# Patient Record
Sex: Female | Born: 1965
Health system: Southern US, Community
[De-identification: ages and names within clinical notes are randomized; demographics above are authoritative.]

## PROBLEM LIST (undated history)

## (undated) DIAGNOSIS — G4730 Sleep apnea, unspecified: Secondary | ICD-10-CM

## (undated) DIAGNOSIS — M5416 Radiculopathy, lumbar region: Secondary | ICD-10-CM

## (undated) DIAGNOSIS — F329 Major depressive disorder, single episode, unspecified: Secondary | ICD-10-CM

## (undated) DIAGNOSIS — Z1211 Encounter for screening for malignant neoplasm of colon: Secondary | ICD-10-CM

## (undated) DIAGNOSIS — D649 Anemia, unspecified: Secondary | ICD-10-CM

## (undated) DIAGNOSIS — G8929 Other chronic pain: Secondary | ICD-10-CM

## (undated) DIAGNOSIS — R51 Headache: Secondary | ICD-10-CM

## (undated) DIAGNOSIS — M1990 Unspecified osteoarthritis, unspecified site: Secondary | ICD-10-CM

## (undated) DIAGNOSIS — N83209 Unspecified ovarian cyst, unspecified side: Secondary | ICD-10-CM

## (undated) DIAGNOSIS — H409 Unspecified glaucoma: Secondary | ICD-10-CM

## (undated) DIAGNOSIS — F419 Anxiety disorder, unspecified: Secondary | ICD-10-CM

## (undated) DIAGNOSIS — J189 Pneumonia, unspecified organism: Secondary | ICD-10-CM

## (undated) DIAGNOSIS — M549 Dorsalgia, unspecified: Secondary | ICD-10-CM

## (undated) DIAGNOSIS — F32A Depression, unspecified: Secondary | ICD-10-CM

## (undated) DIAGNOSIS — I82409 Acute embolism and thrombosis of unspecified deep veins of unspecified lower extremity: Secondary | ICD-10-CM

## (undated) DIAGNOSIS — T7840XA Allergy, unspecified, initial encounter: Secondary | ICD-10-CM

## (undated) HISTORY — DX: Anemia, unspecified: D64.9

## (undated) HISTORY — PX: SPINE SURGERY: SHX786

## (undated) HISTORY — PX: REDUCTION MAMMAPLASTY: SUR839

## (undated) HISTORY — DX: Unspecified ovarian cyst, unspecified side: N83.209

## (undated) HISTORY — DX: Radiculopathy, lumbar region: M54.16

## (undated) HISTORY — DX: Encounter for screening for malignant neoplasm of colon: Z12.11

## (undated) HISTORY — PX: TONSILLECTOMY: SUR1361

## (undated) HISTORY — DX: Major depressive disorder, single episode, unspecified: F32.9

---
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
## 1997-09-28 DIAGNOSIS — T7840XA Allergy, unspecified, initial encounter: Secondary | ICD-10-CM

## 1997-09-28 HISTORY — DX: Allergy, unspecified, initial encounter: T78.40XA

## 2003-09-29 HISTORY — PX: GASTRIC BYPASS: SHX52

## 2004-04-26 ENCOUNTER — Emergency Department (HOSPITAL_COMMUNITY): Admission: EM | Admit: 2004-04-26 | Discharge: 2004-04-26 | Payer: Self-pay | Admitting: Emergency Medicine

## 2004-09-22 ENCOUNTER — Emergency Department: Payer: Self-pay | Admitting: Emergency Medicine

## 2005-01-11 ENCOUNTER — Emergency Department: Payer: Self-pay | Admitting: Emergency Medicine

## 2005-04-14 ENCOUNTER — Ambulatory Visit: Payer: Self-pay | Admitting: Urology

## 2005-06-26 ENCOUNTER — Emergency Department: Payer: Self-pay | Admitting: Emergency Medicine

## 2005-06-26 ENCOUNTER — Other Ambulatory Visit: Payer: Self-pay

## 2006-09-30 ENCOUNTER — Ambulatory Visit: Payer: Self-pay | Admitting: Internal Medicine

## 2006-10-29 HISTORY — PX: ABDOMINAL HYSTERECTOMY: SHX81

## 2006-11-02 ENCOUNTER — Ambulatory Visit: Payer: Self-pay | Admitting: Unknown Physician Specialty

## 2006-11-02 ENCOUNTER — Other Ambulatory Visit: Payer: Self-pay

## 2006-11-04 ENCOUNTER — Ambulatory Visit: Payer: Self-pay | Admitting: Unknown Physician Specialty

## 2006-12-23 ENCOUNTER — Ambulatory Visit: Payer: Self-pay | Admitting: Internal Medicine

## 2007-09-29 HISTORY — PX: BACK SURGERY: SHX140

## 2007-10-24 ENCOUNTER — Emergency Department: Payer: Self-pay | Admitting: Emergency Medicine

## 2007-10-26 ENCOUNTER — Emergency Department: Payer: Self-pay | Admitting: Emergency Medicine

## 2007-12-05 ENCOUNTER — Ambulatory Visit (HOSPITAL_COMMUNITY): Admission: RE | Admit: 2007-12-05 | Discharge: 2007-12-06 | Payer: Self-pay | Admitting: Neurosurgery

## 2008-07-15 ENCOUNTER — Emergency Department: Payer: Self-pay | Admitting: Emergency Medicine

## 2009-04-11 ENCOUNTER — Ambulatory Visit: Payer: Self-pay | Admitting: Internal Medicine

## 2010-04-11 ENCOUNTER — Emergency Department: Payer: Self-pay | Admitting: Emergency Medicine

## 2011-01-18 ENCOUNTER — Emergency Department: Payer: Self-pay | Admitting: Emergency Medicine

## 2011-02-10 NOTE — Op Note (Signed)
[name], [name]             ACCOUNT NO.:  [id number]   MEDICAL RECORD NO.:  [id number]          PATIENT TYPE:  AMB   LOCATION:  SDS                          FACILITY:  [facility]   PHYSICIAN:  Henry A. Pool, M.D.    DATE OF BIRTH:  Apr 27, 1966   DATE OF PROCEDURE:  12/05/2007  DATE OF DISCHARGE:                               OPERATIVE REPORT   SERVICE:  Neurosurgery.   PREOPERATIVE DIAGNOSIS:  Left L5-S1 herniated nucleus pulposus with  radiculopathy.   POSTOPERATIVE DIAGNOSIS:  Left L5-S1 herniated nucleus pulposus with  radiculopathy.   PROCEDURE NOTE:  Left L5-S1 laminotomy and microdiskectomy.   SURGEON:  Kathaleen Maser. Pool, M.D.   ASSISTANT:  Reinaldo Meeker, M.D.   ANESTHESIA:  General endotracheal.   INDICATIONS:  [name] is a 45 year old female with a history of severe  back and left lower extremity pain.  MRI scanning demonstrates evidence  of a huge left-sided L5-S1 disk herniation with marked compression of  the thecal sac and left-sided S1 nerve root.  We discussed the options  of management including operative and nonoperative care.  The patient  has decided to proceed with a left-sided L5-S1 laminotomy and  microdiskectomy__________.   OPERATIVE NOTE:  The patient was placed on the operative table in the  supine position.  After adequate level of anesthesia was achieved, the  patient was positioned prone onto a Wilson frame with her pressure  points appropriately padded.  The patient's lumbar region was prepped  and draped sterilely.  A 10-blade was used to make a linear skin  incision overlying the L5-S1 interspace.  This was carried down sharply  in the midline.  Subperiosteal dissection was performed exposing the  lamina of facet joints at L5 and S1.  Deep __________ were placed.  Intraoperative x-rays were taken and the L5-S1 level was confirmed.  Laminotomy was then performed using high-speed drill, Kerrison rongeurs  to smooth the inferior aspect of the  lamina of L5 and medial aspect of  the L5-S1 facet joint, and the superior rim of the S1 lamina.  Ligament  flavum was then elevated and resected in the usual fashion using  Kerrison rongeurs __________ thecal sac and exiting S1 nerve root were  identified.  Microscope was then brought into the field and used  throughout for microdissection of the left-sided S1 nerve root and  underlying disk area.  Epidural venous plexus was coagulated and cut.  The thecal sac and nerve roots were gently mobilized.  Free disk  herniation was encountered.  This was dissected free using blunt nerve  hooks and pituitary rongeurs.  All ____________ the disk herniation were  resected.  Disk space was entered.  Disk was then removed of all loose  or obviously degenerative disk material.  At this point, a very thorough  decompression had been achieved.  There was no injury to the thecal or  the nerve roots.   The wound was then irrigated with antibiotic solution.  Hemostasis was  ensured with the bipolar electrocautery.  The wound was then closed in  layers using Vicryl suture.  Steri-Strips  and a sterile dressing were  applied.  There were no complications.  The patient tolerated the  procedure well and she returned to the recovery room postoperatively.           ______________________________  Kathaleen Maser Pool, M.D.     HAP/MEDQ  D:  12/05/2007  T:  12/06/2007  Job:  [id number]

## 2011-06-22 LAB — CBC
HCT: 38
MCHC: 33.1
MCV: 84.4
Platelets: 263
WBC: 7.8

## 2011-07-30 ENCOUNTER — Emergency Department: Payer: Self-pay | Admitting: *Deleted

## 2011-11-26 ENCOUNTER — Ambulatory Visit: Payer: Self-pay | Admitting: Specialist

## 2011-12-03 ENCOUNTER — Other Ambulatory Visit: Payer: Self-pay | Admitting: Neurosurgery

## 2011-12-03 DIAGNOSIS — M79605 Pain in left leg: Secondary | ICD-10-CM

## 2011-12-03 DIAGNOSIS — M79604 Pain in right leg: Secondary | ICD-10-CM

## 2011-12-03 DIAGNOSIS — M545 Low back pain: Secondary | ICD-10-CM

## 2011-12-04 ENCOUNTER — Ambulatory Visit
Admission: RE | Admit: 2011-12-04 | Discharge: 2011-12-04 | Disposition: A | Discharge status: Home / Self Care | Payer: Medicaid Other | Source: Ambulatory Visit | Attending: Neurosurgery | Admitting: Neurosurgery

## 2011-12-04 DIAGNOSIS — M79605 Pain in left leg: Secondary | ICD-10-CM

## 2011-12-04 DIAGNOSIS — M545 Low back pain: Secondary | ICD-10-CM

## 2011-12-04 DIAGNOSIS — M79604 Pain in right leg: Secondary | ICD-10-CM

## 2011-12-06 ENCOUNTER — Other Ambulatory Visit: Payer: Self-pay

## 2012-04-04 DIAGNOSIS — F32A Depression, unspecified: Secondary | ICD-10-CM

## 2012-04-04 DIAGNOSIS — F329 Major depressive disorder, single episode, unspecified: Secondary | ICD-10-CM | POA: Insufficient documentation

## 2012-04-04 DIAGNOSIS — G43909 Migraine, unspecified, not intractable, without status migrainosus: Secondary | ICD-10-CM | POA: Insufficient documentation

## 2012-04-04 HISTORY — DX: Depression, unspecified: F32.A

## 2012-09-28 HISTORY — PX: BACK SURGERY: SHX140

## 2012-10-19 ENCOUNTER — Other Ambulatory Visit: Payer: Self-pay | Admitting: Neurosurgery

## 2012-10-28 ENCOUNTER — Encounter (HOSPITAL_COMMUNITY): Payer: Self-pay | Admitting: Pharmacy Technician

## 2012-11-03 ENCOUNTER — Encounter (HOSPITAL_COMMUNITY)
Admission: RE | Admit: 2012-11-03 | Discharge: 2012-11-03 | Disposition: A | Discharge status: Home / Self Care | Payer: Medicaid Other | Source: Ambulatory Visit | Attending: Neurosurgery | Admitting: Neurosurgery

## 2012-11-03 ENCOUNTER — Encounter (HOSPITAL_COMMUNITY): Payer: Self-pay

## 2012-11-03 HISTORY — DX: Depression, unspecified: F32.A

## 2012-11-03 HISTORY — DX: Sleep apnea, unspecified: G47.30

## 2012-11-03 HISTORY — DX: Anxiety disorder, unspecified: F41.9

## 2012-11-03 HISTORY — DX: Unspecified osteoarthritis, unspecified site: M19.90

## 2012-11-03 HISTORY — DX: Headache: R51

## 2012-11-03 HISTORY — DX: Major depressive disorder, single episode, unspecified: F32.9

## 2012-11-03 LAB — CBC WITH DIFFERENTIAL/PLATELET
Eosinophils Relative: 2 % (ref 0–5)
HCT: 41.2 % (ref 36.0–46.0)
Hemoglobin: 13.5 g/dL (ref 12.0–15.0)
Lymphocytes Relative: 47 % — ABNORMAL HIGH (ref 12–46)
MCH: 28.6 pg (ref 26.0–34.0)
MCHC: 32.8 g/dL (ref 30.0–36.0)
Monocytes Relative: 7 % (ref 3–12)
Neutro Abs: 2 10*3/uL (ref 1.7–7.7)
Platelets: 254 10*3/uL (ref 150–400)
RBC: 4.72 MIL/uL (ref 3.87–5.11)
RDW: 14.5 % (ref 11.5–15.5)
WBC: 4.4 10*3/uL (ref 4.0–10.5)

## 2012-11-03 LAB — TYPE AND SCREEN

## 2012-11-03 LAB — ABO/RH: ABO/RH(D): B POS

## 2012-11-03 LAB — BASIC METABOLIC PANEL
GFR calc non Af Amer: >90 mL/min (ref >90)
Glucose, Bld: 86 mg/dL (ref 70–99)

## 2012-11-03 LAB — SURGICAL PCR SCREEN: MRSA, PCR: NEGATIVE

## 2012-11-03 NOTE — Pre-Procedure Instructions (Signed)
Kimberly Cowan  11/03/2012   Your procedure is scheduled on:  11/11/12  Report to [facility] at 530 AM.  Call this number if you have problems the morning of surgery: [phone]   Remember:   Do not eat food or drink liquids after midnight.   Take these medicines the morning of surgery with A SIP OF WATER: hydrocodone   Do not wear jewelry, make-up or nail polish.  Do not wear lotions, powders, or perfumes. You may wear deodorant.  Do not shave 48 hours prior to surgery. Men may shave face and neck.  Do not bring valuables to the hospital.  Contacts, dentures or bridgework may not be worn into surgery.  Leave suitcase in the car. After surgery it may be brought to your room.  For patients admitted to the hospital, checkout time is 11:00 AM the day of  discharge.   Patients discharged the day of surgery will not be allowed to drive  home.  Name and phone number of your driver: family  Special Instructions: Shower using CHG 2 nights before surgery and the night before surgery.  If you shower the day of surgery use CHG.  Use special wash - you have one bottle of CHG for all showers.  You should use approximately 1/3 of the bottle for each shower.   Please read over the following fact sheets that you were given: Pain Booklet, Coughing and Deep Breathing, Blood Transfusion Information, MRSA Information and Surgical Site Infection Prevention

## 2012-11-10 MED ORDER — DEXAMETHASONE SODIUM PHOSPHATE 10 MG/ML IJ SOLN
10.0000 mg | INTRAMUSCULAR | Status: AC
  Administered 2012-11-11: 10 mg via INTRAVENOUS
  Filled 2012-11-10: qty 1

## 2012-11-11 ENCOUNTER — Encounter (HOSPITAL_COMMUNITY): Payer: Self-pay | Admitting: Anesthesiology

## 2012-11-11 ENCOUNTER — Inpatient Hospital Stay (HOSPITAL_COMMUNITY)
Admission: RE | Admit: 2012-11-11 | Discharge: 2012-11-15 | DRG: 460 | Disposition: A | Discharge status: Home Health | Payer: Medicaid Other | Source: Ambulatory Visit | Attending: Neurosurgery | Admitting: Neurosurgery

## 2012-11-11 ENCOUNTER — Encounter (HOSPITAL_COMMUNITY): Payer: Self-pay | Admitting: *Deleted

## 2012-11-11 ENCOUNTER — Ambulatory Visit (HOSPITAL_COMMUNITY): Payer: Medicaid Other | Admitting: Anesthesiology

## 2012-11-11 ENCOUNTER — Ambulatory Visit (HOSPITAL_COMMUNITY): Payer: Medicaid Other

## 2012-11-11 ENCOUNTER — Encounter (HOSPITAL_COMMUNITY)
Admission: RE | Disposition: A | Discharge status: Home Health | Payer: Self-pay | Source: Ambulatory Visit | Attending: Neurosurgery

## 2012-11-11 DIAGNOSIS — Q762 Congenital spondylolisthesis: Secondary | ICD-10-CM

## 2012-11-11 DIAGNOSIS — M5126 Other intervertebral disc displacement, lumbar region: Principal | ICD-10-CM | POA: Diagnosis present

## 2012-11-11 DIAGNOSIS — G4730 Sleep apnea, unspecified: Secondary | ICD-10-CM | POA: Diagnosis present

## 2012-11-11 DIAGNOSIS — F3289 Other specified depressive episodes: Secondary | ICD-10-CM | POA: Diagnosis present

## 2012-11-11 DIAGNOSIS — M47817 Spondylosis without myelopathy or radiculopathy, lumbosacral region: Secondary | ICD-10-CM | POA: Diagnosis present

## 2012-11-11 DIAGNOSIS — M48062 Spinal stenosis, lumbar region with neurogenic claudication: Secondary | ICD-10-CM | POA: Diagnosis present

## 2012-11-11 DIAGNOSIS — F329 Major depressive disorder, single episode, unspecified: Secondary | ICD-10-CM | POA: Diagnosis present

## 2012-11-11 DIAGNOSIS — Z483 Aftercare following surgery for neoplasm: Secondary | ICD-10-CM

## 2012-11-11 DIAGNOSIS — F411 Generalized anxiety disorder: Secondary | ICD-10-CM | POA: Diagnosis present

## 2012-11-11 SURGERY — POSTERIOR LUMBAR FUSION 1 LEVEL
Anesthesia: General | Site: Back | Wound class: Clean

## 2012-11-11 MED ORDER — BUPIVACAINE HCL (PF) 0.25 % IJ SOLN
INTRAMUSCULAR | Status: DC | PRN
  Administered 2012-11-11: 30 mL

## 2012-11-11 MED ORDER — ALUM & MAG HYDROXIDE-SIMETH 200-200-20 MG/5ML PO SUSP: 30.0000 mL | Freq: Four times a day (QID) | ORAL | Status: DC | PRN

## 2012-11-11 MED ORDER — SODIUM CHLORIDE 0.9 % IJ SOLN: 3.0000 mL | INTRAMUSCULAR | Status: DC | PRN

## 2012-11-11 MED ORDER — MORPHINE SULFATE 2 MG/ML IJ SOLN
INTRAMUSCULAR | Status: AC
  Administered 2012-11-11: 2 mg via INTRAMUSCULAR
  Filled 2012-11-11: qty 1

## 2012-11-11 MED ORDER — MENTHOL 3 MG MT LOZG
1.0000 | LOZENGE | OROMUCOSAL | Status: DC | PRN
  Filled 2012-11-11: qty 9

## 2012-11-11 MED ORDER — MEPERIDINE HCL 25 MG/ML IJ SOLN: 6.2500 mg | INTRAMUSCULAR | Status: DC | PRN

## 2012-11-11 MED ORDER — OXYCODONE HCL 5 MG PO TABS: 5.0000 mg | ORAL_TABLET | Freq: Once | ORAL | Status: DC | PRN

## 2012-11-11 MED ORDER — LACTATED RINGERS IV SOLN
INTRAVENOUS | Status: DC | PRN
  Administered 2012-11-11 (×4): via INTRAVENOUS

## 2012-11-11 MED ORDER — ROCURONIUM BROMIDE 100 MG/10ML IV SOLN
INTRAVENOUS | Status: DC | PRN
  Administered 2012-11-11: 50 mg via INTRAVENOUS
  Administered 2012-11-11: 10 mg via INTRAVENOUS
  Administered 2012-11-11: 20 mg via INTRAVENOUS

## 2012-11-11 MED ORDER — VANCOMYCIN HCL 10 G IV SOLR
1250.0000 mg | Freq: Two times a day (BID) | INTRAVENOUS | Status: DC
  Administered 2012-11-11 – 2012-11-15 (×8): 1250 mg via INTRAVENOUS
  Filled 2012-11-11 (×10): qty 1250

## 2012-11-11 MED ORDER — SUCCINYLCHOLINE CHLORIDE 20 MG/ML IJ SOLN
INTRAMUSCULAR | Status: DC | PRN
Start: 2012-11-11 — End: 2012-11-11
  Administered 2012-11-11: 140 mg via INTRAVENOUS

## 2012-11-11 MED ORDER — POLYETHYLENE GLYCOL 3350 17 G PO PACK
17.0000 g | PACK | Freq: Every day | ORAL | Status: DC | PRN
  Administered 2012-11-14: 17 g via ORAL
  Filled 2012-11-11: qty 1

## 2012-11-11 MED ORDER — ONDANSETRON HCL 4 MG/2ML IJ SOLN: 4.0000 mg | Freq: Once | INTRAMUSCULAR | Status: DC | PRN

## 2012-11-11 MED ORDER — FLEET ENEMA 7-19 GM/118ML RE ENEM: 1.0000 | ENEMA | Freq: Once | RECTAL | Status: AC | PRN

## 2012-11-11 MED ORDER — PHENOL 1.4 % MT LIQD: 1.0000 | OROMUCOSAL | Status: DC | PRN

## 2012-11-11 MED ORDER — ZOLPIDEM TARTRATE 5 MG PO TABS: 5.0000 mg | ORAL_TABLET | Freq: Every evening | ORAL | Status: DC | PRN

## 2012-11-11 MED ORDER — MIDAZOLAM HCL 5 MG/5ML IJ SOLN
INTRAMUSCULAR | Status: DC | PRN
  Administered 2012-11-11: 2 mg via INTRAVENOUS

## 2012-11-11 MED ORDER — OXYCODONE HCL 5 MG/5ML PO SOLN: 5.0000 mg | Freq: Once | ORAL | Status: DC | PRN

## 2012-11-11 MED ORDER — SODIUM CHLORIDE 0.9 % IV SOLN
250.0000 mL | INTRAVENOUS | Status: DC
  Administered 2012-11-11: 23:00:00 via INTRAVENOUS

## 2012-11-11 MED ORDER — ALBUMIN HUMAN 5 % IV SOLN
INTRAVENOUS | Status: DC | PRN
  Administered 2012-11-11: 11:00:00 via INTRAVENOUS

## 2012-11-11 MED ORDER — GLYCOPYRROLATE 0.2 MG/ML IJ SOLN
INTRAMUSCULAR | Status: DC | PRN
  Administered 2012-11-11: .8 mg via INTRAVENOUS

## 2012-11-11 MED ORDER — ARTIFICIAL TEARS OP OINT
TOPICAL_OINTMENT | OPHTHALMIC | Status: DC | PRN
  Administered 2012-11-11: 1 via OPHTHALMIC

## 2012-11-11 MED ORDER — PROPOFOL 10 MG/ML IV BOLUS
INTRAVENOUS | Status: DC | PRN
  Administered 2012-11-11: 150 mg via INTRAVENOUS
  Administered 2012-11-11 (×2): 50 mg via INTRAVENOUS

## 2012-11-11 MED ORDER — ACETAMINOPHEN 650 MG RE SUPP: 650.0000 mg | RECTAL | Status: DC | PRN

## 2012-11-11 MED ORDER — LIDOCAINE HCL (CARDIAC) 20 MG/ML IV SOLN
INTRAVENOUS | Status: DC | PRN
  Administered 2012-11-11: 100 mg via INTRAVENOUS

## 2012-11-11 MED ORDER — SODIUM CHLORIDE 0.9 % IV SOLN
INTRAVENOUS | Status: AC
  Administered 2012-11-11: 11:00:00 via INTRAVENOUS
  Filled 2012-11-11: qty 500

## 2012-11-11 MED ORDER — HYDROMORPHONE HCL PF 1 MG/ML IJ SOLN
0.5000 mg | INTRAMUSCULAR | Status: DC | PRN
  Administered 2012-11-11 – 2012-11-13 (×7): 1 mg via INTRAVENOUS
  Filled 2012-11-11 (×8): qty 1

## 2012-11-11 MED ORDER — THROMBIN 20000 UNITS EX SOLR
OROMUCOSAL | Status: DC | PRN
  Administered 2012-11-11: 09:00:00 via TOPICAL

## 2012-11-11 MED ORDER — HYDROMORPHONE HCL PF 1 MG/ML IJ SOLN
INTRAMUSCULAR | Status: AC
  Filled 2012-11-11: qty 1

## 2012-11-11 MED ORDER — FENTANYL CITRATE 0.05 MG/ML IJ SOLN
INTRAMUSCULAR | Status: DC | PRN
  Administered 2012-11-11: 50 ug via INTRAVENOUS
  Administered 2012-11-11: 100 ug via INTRAVENOUS

## 2012-11-11 MED ORDER — DEXTROSE 5 % IV SOLN
INTRAVENOUS | Status: DC | PRN
  Administered 2012-11-11: 08:00:00 via INTRAVENOUS

## 2012-11-11 MED ORDER — ONDANSETRON HCL 4 MG/2ML IJ SOLN
4.0000 mg | INTRAMUSCULAR | Status: DC | PRN
  Administered 2012-11-13: 4 mg via INTRAVENOUS
  Filled 2012-11-11: qty 2

## 2012-11-11 MED ORDER — DIAZEPAM 5 MG PO TABS
5.0000 mg | ORAL_TABLET | Freq: Four times a day (QID) | ORAL | Status: DC | PRN
  Administered 2012-11-11: 10 mg via ORAL
  Administered 2012-11-12: 5 mg via ORAL
  Administered 2012-11-15 (×2): 10 mg via ORAL
  Filled 2012-11-11 (×2): qty 2
  Filled 2012-11-11 (×3): qty 1

## 2012-11-11 MED ORDER — NEOSTIGMINE METHYLSULFATE 1 MG/ML IJ SOLN
INTRAMUSCULAR | Status: DC | PRN
  Administered 2012-11-11: 5 mg via INTRAVENOUS

## 2012-11-11 MED ORDER — SENNA 8.6 MG PO TABS
1.0000 | ORAL_TABLET | Freq: Two times a day (BID) | ORAL | Status: DC
  Administered 2012-11-12 – 2012-11-15 (×7): 8.6 mg via ORAL
  Filled 2012-11-11 (×10): qty 1

## 2012-11-11 MED ORDER — ONDANSETRON HCL 4 MG/2ML IJ SOLN
INTRAMUSCULAR | Status: DC | PRN
  Administered 2012-11-11: 4 mg via INTRAVENOUS

## 2012-11-11 MED ORDER — VANCOMYCIN HCL IN DEXTROSE 1-5 GM/200ML-% IV SOLN
INTRAVENOUS | Status: AC
  Administered 2012-11-11: 1000 mg via INTRAVENOUS
  Filled 2012-11-11: qty 200

## 2012-11-11 MED ORDER — FENTANYL CITRATE 0.05 MG/ML IJ SOLN
INTRAMUSCULAR | Status: DC | PRN
  Administered 2012-11-11: 150 ug via INTRAVENOUS
  Administered 2012-11-11 (×7): 50 ug via INTRAVENOUS

## 2012-11-11 MED ORDER — HYDROMORPHONE HCL PF 1 MG/ML IJ SOLN
INTRAMUSCULAR | Status: AC
  Administered 2012-11-11: 0.5 mg via INTRAVENOUS
  Filled 2012-11-11: qty 1

## 2012-11-11 MED ORDER — ACETAMINOPHEN 325 MG PO TABS
650.0000 mg | ORAL_TABLET | ORAL | Status: DC | PRN
  Filled 2012-11-11: qty 2

## 2012-11-11 MED ORDER — BISACODYL 10 MG RE SUPP: 10.0000 mg | Freq: Every day | RECTAL | Status: DC | PRN

## 2012-11-11 MED ORDER — HYDROCODONE-ACETAMINOPHEN 5-325 MG PO TABS: 1.0000 | ORAL_TABLET | ORAL | Status: DC | PRN

## 2012-11-11 MED ORDER — HYDROMORPHONE HCL PF 1 MG/ML IJ SOLN
0.2500 mg | INTRAMUSCULAR | Status: DC | PRN
  Administered 2012-11-11 (×4): 0.5 mg via INTRAVENOUS

## 2012-11-11 MED ORDER — SODIUM CHLORIDE 0.9 % IJ SOLN
3.0000 mL | Freq: Two times a day (BID) | INTRAMUSCULAR | Status: DC
  Administered 2012-11-12 – 2012-11-15 (×6): 3 mL via INTRAVENOUS

## 2012-11-11 MED ORDER — OXYCODONE-ACETAMINOPHEN 5-325 MG PO TABS
1.0000 | ORAL_TABLET | ORAL | Status: DC | PRN
  Administered 2012-11-11 – 2012-11-15 (×21): 2 via ORAL
  Filled 2012-11-11 (×21): qty 2

## 2012-11-11 MED ORDER — SODIUM CHLORIDE 0.9 % IR SOLN
Status: DC | PRN
  Administered 2012-11-11: 09:00:00

## 2012-11-11 MED ORDER — 0.9 % SODIUM CHLORIDE (POUR BTL) OPTIME
TOPICAL | Status: DC | PRN
  Administered 2012-11-11: 1000 mL

## 2012-11-11 MED ORDER — BACITRACIN 50000 UNITS IM SOLR
INTRAMUSCULAR | Status: AC
  Filled 2012-11-11: qty 1

## 2012-11-11 SURGICAL SUPPLY — 67 items
ADH SKN CLS APL DERMABOND .7 (GAUZE/BANDAGES/DRESSINGS) ×2
APL SKNCLS STERI-STRIP NONHPOA (GAUZE/BANDAGES/DRESSINGS) ×1
BAG DECANTER FOR FLEXI CONT (MISCELLANEOUS) ×2 IMPLANT
BENZOIN TINCTURE PRP APPL 2/3 (GAUZE/BANDAGES/DRESSINGS) ×2 IMPLANT
BLADE SURG ROTATE 9660 (MISCELLANEOUS) IMPLANT
BRUSH SCRUB EZ PLAIN DRY (MISCELLANEOUS) ×2 IMPLANT
BUR MATCHSTICK NEURO 3.0 LAGG (BURR) ×2 IMPLANT
CAGE CAPSTONE BULLET 8X22 (Cage) ×1 IMPLANT
CANISTER SUCTION 2500CC (MISCELLANEOUS) ×2 IMPLANT
CAP LCK SPNE (Orthopedic Implant) ×4 IMPLANT
CAP LOCK SPINE RADIUS (Orthopedic Implant) IMPLANT
CAP LOCKING (Orthopedic Implant) ×8 IMPLANT
CLOTH BEACON ORANGE TIMEOUT ST (SAFETY) ×2 IMPLANT
CONT SPEC 4OZ CLIKSEAL STRL BL (MISCELLANEOUS) ×4 IMPLANT
COVER BACK TABLE 24X17X13 BIG (DRAPES) IMPLANT
COVER TABLE BACK 60X90 (DRAPES) ×2 IMPLANT
DECANTER SPIKE VIAL GLASS SM (MISCELLANEOUS) ×2 IMPLANT
DERMABOND ADVANCED (GAUZE/BANDAGES/DRESSINGS) ×2
DERMABOND ADVANCED .7 DNX12 (GAUZE/BANDAGES/DRESSINGS) ×1 IMPLANT
DRAPE C-ARM 42X72 X-RAY (DRAPES) ×4 IMPLANT
DRAPE LAPAROTOMY 100X72X124 (DRAPES) ×2 IMPLANT
DRAPE POUCH INSTRU U-SHP 10X18 (DRAPES) ×2 IMPLANT
DRAPE PROXIMA HALF (DRAPES) IMPLANT
DRAPE SURG 17X23 STRL (DRAPES) ×8 IMPLANT
DURASEAL SPINE SEALANT 3ML (MISCELLANEOUS) ×1 IMPLANT
ELECT BLADE 4.0 EZ CLEAN MEGAD (MISCELLANEOUS) ×2
ELECT REM PT RETURN 9FT ADLT (ELECTROSURGICAL) ×2
ELECTRODE BLDE 4.0 EZ CLN MEGD (MISCELLANEOUS) IMPLANT
ELECTRODE REM PT RTRN 9FT ADLT (ELECTROSURGICAL) ×1 IMPLANT
EVACUATOR 1/8 PVC DRAIN (DRAIN) ×2 IMPLANT
GAUZE SPONGE 4X4 16PLY XRAY LF (GAUZE/BANDAGES/DRESSINGS) ×1 IMPLANT
GLOVE BIOGEL PI IND STRL 7.0 (GLOVE) IMPLANT
GLOVE BIOGEL PI INDICATOR 7.0 (GLOVE) ×2
GLOVE ECLIPSE 7.5 STRL STRAW (GLOVE) ×2 IMPLANT
GLOVE ECLIPSE 8.5 STRL (GLOVE) ×4 IMPLANT
GLOVE EXAM NITRILE LRG STRL (GLOVE) ×2 IMPLANT
GLOVE EXAM NITRILE MD LF STRL (GLOVE) ×1 IMPLANT
GLOVE EXAM NITRILE XL STR (GLOVE) IMPLANT
GLOVE EXAM NITRILE XS STR PU (GLOVE) IMPLANT
GLOVE SS BIOGEL STRL SZ 6.5 (GLOVE) IMPLANT
GLOVE SUPERSENSE BIOGEL SZ 6.5 (GLOVE) ×2
GOWN BRE IMP SLV AUR LG STRL (GOWN DISPOSABLE) ×2 IMPLANT
GOWN BRE IMP SLV AUR XL STRL (GOWN DISPOSABLE) ×4 IMPLANT
GOWN STRL REIN 2XL LVL4 (GOWN DISPOSABLE) IMPLANT
KIT BASIN OR (CUSTOM PROCEDURE TRAY) ×2 IMPLANT
KIT ROOM TURNOVER OR (KITS) ×2 IMPLANT
MILL MEDIUM DISP (BLADE) ×1 IMPLANT
NEEDLE HYPO 22GX1.5 SAFETY (NEEDLE) ×2 IMPLANT
NS IRRIG 1000ML POUR BTL (IV SOLUTION) ×2 IMPLANT
PACK LAMINECTOMY NEURO (CUSTOM PROCEDURE TRAY) ×2 IMPLANT
ROD RADIUS 35MM (Rod) ×2 IMPLANT
SCREW 5.75X40M (Screw) ×2 IMPLANT
SCREW 6.75X40MM (Screw) ×2 IMPLANT
SPONGE GAUZE 4X4 12PLY (GAUZE/BANDAGES/DRESSINGS) ×2 IMPLANT
SPONGE SURGIFOAM ABS GEL 100 (HEMOSTASIS) ×2 IMPLANT
STRIP CLOSURE SKIN 1/2X4 (GAUZE/BANDAGES/DRESSINGS) ×3 IMPLANT
SUT NURALON 4 0 TR CR/8 (SUTURE) ×1 IMPLANT
SUT VIC AB 0 CT1 18XCR BRD8 (SUTURE) ×2 IMPLANT
SUT VIC AB 0 CT1 8-18 (SUTURE) ×6
SUT VIC AB 2-0 CT1 18 (SUTURE) ×2 IMPLANT
SUT VIC AB 3-0 SH 8-18 (SUTURE) ×4 IMPLANT
SYR 20ML ECCENTRIC (SYRINGE) ×2 IMPLANT
TOWEL OR 17X24 6PK STRL BLUE (TOWEL DISPOSABLE) ×2 IMPLANT
TOWEL OR 17X26 10 PK STRL BLUE (TOWEL DISPOSABLE) ×2 IMPLANT
TRAY FOLEY CATH 14FRSI W/METER (CATHETERS) ×2 IMPLANT
WATER STERILE IRR 1000ML POUR (IV SOLUTION) ×2 IMPLANT
WEDGE TANGENT 8X26MM ×1 IMPLANT

## 2012-11-11 NOTE — H&P (Signed)
  Kimberly Cowan is an 47 y.o. female.   Chief Complaint: Back pain HPI: 47 year old female status post previous L5-S1 laminotomy and discectomy remotely presents with progressive worsening lower back pain with radiation to both lower trimming his. Workup demonstrates evidence of severe disc space collapse and severe neuroforaminal stenosis with evidence of segmental instability at L5-S1. Patient's failed conservative management including injections. She presents now for lumbar decompression and fusion surgery in hopes of improving her symptoms.  Past Medical History  Diagnosis Date  . Sleep apnea     no cpcap     last sleep study > 4 yrs  . Anxiety   . Depression   . Arthritis   . Headache     Past Surgical History  Procedure Laterality Date  . C sections    . Tonsillectomy    . Gastric bypass    . Abdominal hysterectomy    . Back surgery    . Breast reduction surgery      History reviewed. No pertinent family history. Social History:  reports that she has never smoked. She does not have any smokeless tobacco history on file. She reports that she does not drink alcohol or use illicit drugs.  Allergies:  Allergies  Allergen Reactions  . Morphine And Related Hives, Nausea And Vomiting and Other (See Comments)    "can't breathe"  . Penicillins Hives, Nausea And Vomiting and Other (See Comments)    "can't breathe"    Medications Prior to Admission  Medication Sig Dispense Refill  . HYDROcodone-acetaminophen (NORCO) 10-325 MG per tablet Take 1 tablet by mouth every 6 (six) hours as needed. For pain        No results found for this or any previous visit (from the past 48 hour(s)). No results found.  A comprehensive review of systems was negative.  Blood pressure 133/79, pulse 70, temperature 98.3 F (36.8 C), temperature source Oral, resp. rate 20, SpO2 95.00%.  She is awake and alert. She is oriented and appropriate. Cranial nerve function is intact. Motor function of  her extremities is normal. Sensory examination was decreased vision over light touch in her L5 and S1 dermatomes bilaterally. Deep tendon reflexes are normal active except her Achilles reflexes are absent bilaterally. There is no evidence of long track signs. Gait is abnormal. Posture is flexed. Examination head ears eyes nose and throat is unremarkable. Chest and abdomen is unremarkable and benign except for severe obesity. Extremities are free from injury deformity. Assessment/Plan L5-S1 stenosis with neurogenic claudication. Plan L5-S1 decompressive laminectomy with bilateral L5 and S1 decompressive foraminotomies followed by posterior lumbar interbody fusion utilizing tangent interbody allograft wedge Telamon interbody peek cage and local autograft. This to be coupled with posterior lateral arthrodesis utilizing nonsegmental pedicle screw station and local autograft. Risks and benefits been explained. Patient wishes to proceed.  Amerah Puleo A 11/11/2012, 7:31 AM

## 2012-11-11 NOTE — Progress Notes (Signed)
Utilization review completed.  

## 2012-11-11 NOTE — Anesthesia Procedure Notes (Signed)
Procedure Name: Intubation Date/Time: 11/11/2012 7:52 AM Performed by: Sherie Don Pre-anesthesia Checklist: Patient identified, Emergency Drugs available, Suction available, Patient being monitored and Timeout performed Patient Re-evaluated:Patient Re-evaluated prior to inductionOxygen Delivery Method: Circle system utilized Preoxygenation: Pre-oxygenation with 100% oxygen Intubation Type: IV induction Ventilation: Mask ventilation without difficulty Laryngoscope Size: Mac and 4 Grade View: Grade II Tube type: Oral Tube size: 7.5 mm Number of attempts: 1 Airway Equipment and Method: Stylet Placement Confirmation: ETT inserted through vocal cords under direct vision,  positive ETCO2 and breath sounds checked- equal and bilateral Secured at: 22 cm Tube secured with: Tape Dental Injury: Teeth and Oropharynx as per pre-operative assessment

## 2012-11-11 NOTE — Op Note (Signed)
Date of procedure: 11/11/2012  Date of dictation: Same  Service: Neurosurgery  Preoperative diagnosis: L5-S1 spondylosis with stenosis and neurogenic claudication involving both L5 and S1 nerve roots. status post previous left-sided L5-S1 microdiscectomy.  Postoperative diagnosis: Same  Procedure Name: Reexploration of L5-S1 laminotomy with bilateral complete L5-S1 decompressive laminectomy and bilateral L5 and S1 foraminotomies, more than would be required for simple interbody fusion alone.  L5-S1 posterior lumbar interbody fusion utilizing tangent interbody allograft wedge Telamon interbody peek cage and local autograft.  L5-S1 posterior lateral arthrodesis utilizing nonsegmental pedicle screw instrumentation and local autograft.  Surgeon:Giorgio Chabot A.Aubryana Vittorio, M.D.  Asst. Surgeon: Phoebe Perch  Anesthesia: General  Indication: 47 year old female status post previous L5-S1 laminotomy and microdiscectomy with good initial results who presents now with progressive back and bilateral lower chamois pain paresthesias and weakness consistent with neurogenic claudication involving both L5 and S1 nerve roots. Patient has failed conservative management. She has an MRI scan which demonstrates progressive stenosis at L5-S1 and presents now for L5-S1 decompression and fusion in hopes of improving her symptoms.  Operative note: After induction of anesthesia, patient is positioned prone and appropriately padded. Lumbar region prepped and draped. Incision made overlying L5-S1. Subperiosteal dissection performed bilaterally exposing the lamina facet joints and transverse processes of L5 and the sacral ala bilaterally. Self-retaining retractor was placed intraoperative fluoroscopy used levels were confirmed. Previous laminotomy site on the left at L5-S1 dissected free and epidural scar resected. Complete laminectomy of L5 performed using Leksell rongeurs Kerrison rongeurs and a high-speed drill. Complete bilateral L5  anterior facetectomies performed. Superior facetectomies at S1 performed bilaterally. Superior laminotomy laminectomy of S1 performed bilaterally. Ligament flavum and epidural scar were elevated and resected. Bilateral discectomies were then performed at L5-S1. Disc space distracted to 8 mm and an 8 mm distractor less than patient's left side. Thecal sac and nerve respect on the right side. The space and reamed and then cut with 8 mm tangent instruments. Soft tissue was removed and interspace. 8 x 26 mm tangent wedges and packed into place and recessed roughly 2 mm in the posterior cortical margin of L5 on the right side. Thecal sac and nerve respect of the left side. The space was again reamed and then cut with 8 mm tangent instruments. Soft tissues removed and interspace. The spaces further curettage. Morselize autograft packed within the disc space for later fusion. 8 x 22 mm Telamon cage packed with morselized autograft and packed into place and recessed roughly 2 mm in the posterior cortical margin of L5. Pedicles of L5 and S1 were then identified using surface landmarks and intraoperative fluoroscopy. Superficial bone overlying the pedicle was removed using high-speed drill. Each pedicle was then probed using pedicle awl. Each pedicle awl track was probed and found to be solidly within bone. 5.75 x 45 mm screws placed bilaterally at L5. 6.75 x 40 mm screws placed bilaterally at S1. Transverse processes and sacroiliac then decorticated using high-speed drill. Morselized autograft packed posterior laterally for later fusion. Short segment titanium rod was then placed over the screw heads at L5 and S1. Locking caps and placed over the screw. Locking catching and engaged with the construct under compression. Final images revealed good position the bone graft and hardware at the proper upper level with normal lamina spine. Wound is then irrigated one final time. Gelfoam was placed topically for hemostasis which  Ascent be good. Medium Hemovac drain was left at per space. Wounds and close in layers with Vicryl sutures. Steri-Strips and sterile dressing  were applied. There were no apparent complications. Patient tolerated the procedure well and she returns they're covering postop.

## 2012-11-11 NOTE — Progress Notes (Signed)
Visited w/pt and family and friends who were bedside. Pt said she was feeling ok since surgery, but not quite ready to get up. She was thankful for visit and prayer.  Kimberly Cowan Chaplain  11/11/12 1600  Clinical Encounter Type  Visited With Patient and family together

## 2012-11-11 NOTE — Progress Notes (Signed)
ANTIBIOTIC CONSULT NOTE - INITIAL  Pharmacy Consult for vancomycin Indication: surgical prophylaxis (drain in place)  Allergies  Allergen Reactions  . Morphine And Related Hives, Nausea And Vomiting and Other (See Comments)    "can't breathe"  . Penicillins Hives, Nausea And Vomiting and Other (See Comments)    "can't breathe"    Patient Measurements:    Vital Signs: Temp: 97.6 F (36.4 C) (02/14 1314) Temp src: Oral (02/14 0609) BP: 138/83 mmHg (02/14 1258) Pulse Rate: 76 (02/14 1304) Intake/Output from previous day:   Intake/Output from this shift: Total I/O In: 4190 [I.V.:3800; Blood:140; IV Piggyback:250] Out: 750 [Urine:300; Drains:50; Blood:400]  Labs: No results found for this basename: WBC, HGB, PLT, LABCREA, CREATININE,  in the last 72 hours CrCl is unknown because there is no height on file for the current visit. No results found for this basename: VANCOTROUGH, VANCOPEAK, VANCORANDOM, GENTTROUGH, GENTPEAK, GENTRANDOM, TOBRATROUGH, TOBRAPEAK, TOBRARND, AMIKACINPEAK, AMIKACINTROU, AMIKACIN,  in the last 72 hours   Microbiology: No results found for this or any previous visit (from the past 720 hour(s)).  Medical History: Past Medical History  Diagnosis Date  . Sleep apnea     no cpcap     last sleep study > 4 yrs  . Anxiety   . Depression   . Arthritis   . Headache     Medications:  Anti-infectives   Start     Dose/Rate Route Frequency Ordered Stop   11/11/12 2000  vancomycin (VANCOCIN) 1,250 mg in sodium chloride 0.9 % 250 mL IVPB     1,250 mg 166.7 mL/hr over 90 Minutes Intravenous Every 12 hours 11/11/12 1428     11/11/12 0837  bacitracin 50,000 Units in sodium chloride irrigation 0.9 % 500 mL irrigation  Status:  Discontinued       As needed 11/11/12 0838 11/11/12 1134   11/11/12 0733  vancomycin (VANCOCIN) 1 GM/200ML IVPB    Comments:  [name] B: cabinet override      11/11/12 0733 11/11/12 0750   11/11/12 0714  bacitracin 16109 UNITS  injection    Comments:  [name]: cabinet override      11/11/12 0714 11/11/12 1914     Assessment: Kimberly Cowan with history of multiple back surgeries presents with worsening back pain. Now s/p posterior lumbar fusion with a hemovac drain in the epidural space. To start empiric vancomycin for surgical prophylaxis with drain in place. Pt is afebrile but no recent CBC. No cultures available.   Goal of Therapy:  Vancomycin trough level 10-15 mcg/ml  Plan:  1. Vancomycin 1250mg  IV Q12H 2. F/u renal fxn, C&S, clinical status and trough 3. F/u drain and LOT  Claudio Mondry, Drake Leach 11/11/2012,2:28 PM

## 2012-11-11 NOTE — Anesthesia Postprocedure Evaluation (Signed)
Anesthesia Post Note  Patient: Kimberly Cowan  Procedure(s) Performed: Procedure(s) (LRB): POSTERIOR LUMBAR FUSION 1 LEVEL (N/A)  Anesthesia type: general  Patient location: PACU  Post pain: Pain level controlled  Post assessment: Patient's Cardiovascular Status Stable  Last Vitals:  Filed Vitals:   11/11/12 1200  BP: 136/84  Pulse: 64  Temp:   Resp: 25    Post vital signs: Reviewed and stable  Level of consciousness: sedated  Complications: No apparent anesthesia complications

## 2012-11-11 NOTE — Preoperative (Signed)
Beta Blockers   Reason not to administer Beta Blockers:Not Applicable 

## 2012-11-11 NOTE — Progress Notes (Signed)
Pt. Falls asleep and snores , sats dropping and has to be reminded to breath

## 2012-11-11 NOTE — Progress Notes (Signed)
Orthopedic Tech Progress Note Patient Details:  Kimberly Cowan 04-21-1966 [id number]  Patient ID: [name], female   DOB: 1966/09/13, 47 y.o.   MRN: [id number]   [name] 11/11/2012, 2:43 PM L-S SUPPORT COMPLETED BY [employer].

## 2012-11-11 NOTE — Brief Op Note (Signed)
11/11/2012  11:32 AM  PATIENT:  Kimberly Cowan  47 y.o. female  PRE-OPERATIVE DIAGNOSIS:  Herniated Nucleus Pulposus/spondylosis/stenosis  POST-OPERATIVE DIAGNOSIS:  Herniated Nucleus Pulposus/spondylosis/stenosis  PROCEDURE:  Procedure(s) with comments: POSTERIOR LUMBAR FUSION 1 LEVEL (N/A) - Posterior Lumbar Interbody Fusion/Decompressive Lumbar Laminectomy, Posterior Lumbar Arthrodesis, Lumbar Five-Sacral one with Tangent wedge, Telemon Cage, and Pedicle Screws  SURGEON:  Surgeon(s) and Role:    * Temple Pacini, MD - Primary    * Clydene Fake, MD - Assisting  PHYSICIAN ASSISTANT:   ASSISTANTS:    ANESTHESIA:   General  EBL:  Total I/O In: 3690 [I.V.:3300; Blood:140; IV Piggyback:250] Out: 550 [Urine:150; Blood:400]  BLOOD ADMINISTERED:none  DRAINS: (Medium) Hemovact drain(s) in the Epidural space with  Suction Open   LOCAL MEDICATIONS USED:  MARCAINE     SPECIMEN:  No Specimen  DISPOSITION OF SPECIMEN:  N/A  COUNTS:  YES  TOURNIQUET:  * No tourniquets in log *  DICTATION: .Dragon Dictation  PLAN OF CARE: Admit to inpatient   PATIENT DISPOSITION:  PACU - hemodynamically stable.   Delay start of Pharmacological VTE agent (>24hrs) due to surgical blood loss or risk of bleeding: yes

## 2012-11-11 NOTE — Anesthesia Preprocedure Evaluation (Addendum)
Anesthesia Evaluation  Patient identified by MRN, date of birth, ID band Patient awake    Reviewed: Allergy & Precautions, H&P , NPO status , Patient's Chart, lab work & pertinent test results  Airway Mallampati: II TM Distance: >3 FB Neck ROM: Full    Dental   Pulmonary sleep apnea ,          Cardiovascular     Neuro/Psych Anxiety Depression    GI/Hepatic   Endo/Other  Morbid obesity  Renal/GU      Musculoskeletal   Abdominal   Peds  Hematology   Anesthesia Other Findings   Reproductive/Obstetrics                          Anesthesia Physical Anesthesia Plan  ASA: III  Anesthesia Plan: General   Post-op Pain Management:    Induction: Intravenous  Airway Management Planned: Oral ETT  Additional Equipment:   Intra-op Plan:   Post-operative Plan: Extubation in OR  Informed Consent: I have reviewed the patients History and Physical, chart, labs and discussed the procedure including the risks, benefits and alternatives for the proposed anesthesia with the patient or authorized representative who has indicated his/her understanding and acceptance.   Dental advisory given  Plan Discussed with: CRNA, Surgeon and Anesthesiologist  Anesthesia Plan Comments:        Anesthesia Quick Evaluation

## 2012-11-11 NOTE — Transfer of Care (Signed)
Immediate Anesthesia Transfer of Care Note  Patient: [name]  Procedure(s) Performed: Procedure(s) with comments: POSTERIOR LUMBAR FUSION 1 LEVEL (N/A) - Posterior Lumbar Interbody Fusion/Decompressive Lumbar Laminectomy, Posterior Lumbar Arthrodesis, Lumbar Five-Sacral one with Tangent wedge, Telemon Cage, and Pedicle Screws  Patient Location: PACU  Anesthesia Type:General  Level of Consciousness: sedated and patient cooperative  Airway & Oxygen Therapy: Patient Spontanous Breathing and Patient connected to face mask  Post-op Assessment: Report given to PACU RN, Post -op Vital signs reviewed and stable, Patient moving all extremities and Patient moving all extremities X 4  Post vital signs: Reviewed and stable  Complications: No apparent anesthesia complications

## 2012-11-12 ENCOUNTER — Encounter (HOSPITAL_COMMUNITY): Payer: Self-pay | Admitting: *Deleted

## 2012-11-12 NOTE — Progress Notes (Signed)
Patient ID: Kimberly Cowan, female   DOB: Jul 14, 1966, 47 y.o.   MRN: [id number] Patient complains of significant postoperative back soreness. Legs somewhat better than preop. I think she has fairly good strength to an in bed exam but she gives me somewhat limited effort. We'll mobilize today with therapy.  Continue to control pain.

## 2012-11-12 NOTE — Progress Notes (Signed)
Occupational Therapy Evaluation Patient Details Name: Kimberly Cowan MRN: [id number] DOB: 1966/02/14 Today's Date: 11/12/2012 Time: 1324-4010 OT Time Calculation (min): 25 min  OT Assessment / Plan / Recommendation Clinical Impression  47 yo s/p L5 - S1 PLIF. Wears corsett when OOB. Pt will benefit from skilled OT services to max independence with ADL and functional mobility for ADL to facilitate D/C home with [facility] services. Will issue pt AE for ADL.    OT Assessment  Patient needs continued OT Services    Follow Up Recommendations  Home health OT    Barriers to Discharge None    Equipment Recommendations  3 in 1 bedside comode;Tub/shower bench    Recommendations for Other Services    Frequency  Min 3X/week    Precautions / Restrictions Precautions Precautions: Back;Fall Precaution Booklet Issued: Yes (comment) Precaution Comments: instructed pt.in BAT back precautions and log rolling Required Braces or Orthoses: Spinal Brace Spinal Brace: Lumbar corset;Applied in sitting position   Pertinent Vitals/Pain 4. back    ADL  Grooming: Set up;Supervision/safety Where Assessed - Grooming: Supported sitting Upper Body Bathing: Set up Where Assessed - Upper Body Bathing: Unsupported sitting Lower Body Bathing: Maximal assistance Where Assessed - Lower Body Bathing: Supported sit to stand Upper Body Dressing: Set up Where Assessed - Upper Body Dressing: Unsupported sitting Lower Body Dressing: Maximal assistance Where Assessed - Lower Body Dressing: Supported sit to stand Toilet Transfer: Minimal assistance Toilet Transfer Method: Sit to Barista: Bedside commode;Other (comment) (over toilet) Toileting - Clothing Manipulation and Hygiene: Moderate assistance Where Assessed - Toileting Clothing Manipulation and Hygiene: Standing (A with hygiene) Equipment Used: Gait belt;Rolling walker Transfers/Ambulation Related to ADLs: min A ADL Comments: no  knowledge of AE    OT Diagnosis: Generalized weakness;Acute pain  OT Problem List: Decreased strength;Decreased range of motion;Decreased activity tolerance;Decreased safety awareness;Decreased knowledge of use of DME or AE;Decreased knowledge of precautions;Obesity;Pain OT Treatment Interventions: Self-care/ADL training;Energy conservation;DME and/or AE instruction;Therapeutic activities;Patient/family education   OT Goals Acute Rehab OT Goals OT Goal Formulation: With patient Time For Goal Achievement: 11/19/12 Potential to Achieve Goals: Good ADL Goals Pt Will Perform Lower Body Bathing: with supervision;with set-up;with caregiver independent in assisting;Sit to stand from bed;Unsupported;with adaptive equipment ADL Goal: Lower Body Bathing - Progress: Goal set today Pt Will Perform Lower Body Dressing: with set-up;with supervision;with caregiver independent in assisting;Sit to stand from bed;Unsupported;with adaptive equipment ADL Goal: Lower Body Dressing - Progress: Goal set today Pt Will Transfer to Toilet: with modified independence;with DME;Ambulation;3-in-1;Maintaining back safety precautions ADL Goal: Toilet Transfer - Progress: Goal set today Pt Will Perform Toileting - Clothing Manipulation: with modified independence;Standing;Sitting on 3-in-1 or toilet;with adaptive equipment ADL Goal: Toileting - Clothing Manipulation - Progress: Goal set today Pt Will Perform Toileting - Hygiene: with modified independence;Sit to stand from 3-in-1/toilet;with adaptive equipment ADL Goal: Toileting - Hygiene - Progress: Goal set today Pt Will Perform Tub/Shower Transfer: with supervision;with caregiver independent in assisting;Ambulation;with DME;Transfer tub bench;Maintaining back safety precautions ADL Goal: Tub/Shower Transfer - Progress: Goal set today Additional ADL Goal #1: Pt will independently demonstrate back precautions during ADL and mobility ADL Goal: Additional Goal #1 -  Progress: Goal set today  Visit Information  Last OT Received On: 11/12/12 Assistance Needed: +1    Subjective Data      Prior Functioning     Home Living Lives With: Daughter;Other (Comment) Available Help at Discharge: Family;Available PRN/intermittently Type of Home: House Home Access: Stairs to enter Entrance Stairs-Rails: Right;Left;Can reach  both Home Layout: One level Bathroom Shower/Tub: Counselling psychologist: Yes How Accessible: Accessible via walker Home Adaptive Equipment: Straight cane Prior Function Level of Independence: Independent with assistive device(s) Able to Take Stairs?: Yes Driving: Yes Vocation: Other (comment) Comments: awaiting disability Communication Communication: No difficulties Dominant Hand: Left         Vision/Perception Vision - History Baseline Vision: Wears glasses all the time   Cognition  Cognition Overall Cognitive Status: Appears within functional limits for tasks assessed/performed Arousal/Alertness: Awake/alert Orientation Level: Oriented X4 / Intact Behavior During Session: [facility] for tasks performed    Extremity/Trunk Assessment Right Upper Extremity Assessment RUE ROM/Strength/Tone: WFL for tasks assessed RUE Sensation:  (CT B) Left Upper Extremity Assessment LUE ROM/Strength/Tone: WFL for tasks assessed Right Lower Extremity Assessment RLE ROM/Strength/Tone: Within functional levels RLE Sensation: WFL - Light Touch Left Lower Extremity Assessment LLE ROM/Strength/Tone: WFL for tasks assessed Trunk Assessment Trunk Assessment: Normal     Mobility Bed Mobility Bed Mobility: Rolling Left;Left Sidelying to Sit;Sitting - Scoot to Edge of Bed Rolling Left: 5: Supervision;With rail Left Sidelying to Sit: 4: Min assist;HOB flat;With rails Sitting - Scoot to Edge of Bed: 5: Supervision Transfers Transfers: Sit to Stand;Stand to Sit Sit to Stand: 4: Min  assist;With upper extremity assist;From bed Stand to Sit: 4: Min assist;To chair/3-in-1 Details for Transfer Assistance: vc for techniques and spinal precautions     Exercise     Balance  WFL for ADL   End of Session OT - End of Session Equipment Utilized During Treatment: Gait belt;Back brace Activity Tolerance: Patient tolerated treatment well Patient left: Other (comment) (toilet) Nurse Communication: Mobility status;Precautions  GO     Ajiah Mcglinn,HILLARY 11/12/2012, 5:48 PM [facility], OTR/L  [phone] 11/12/2012

## 2012-11-12 NOTE — Progress Notes (Signed)
CARE MANAGEMENT NOTE 11/12/2012  Patient:  Kimberly Cowan, Kimberly Cowan   Account Number:  [id number]  Date Initiated:  11/12/2012  Documentation initiated by:  Vance Peper  Subjective/Objective Assessment:   47 yr old female s/p re-exploration L5-S1 laminectomy with bilateral complete L5-S1decompressive lam and bilateral L5-S1 foraminotomies.     Action/Plan:   CM spoke with patient concerning home health and DME needs at discharge.   Anticipated DC Date:  11/13/2012   Anticipated DC Plan:  HOME W HOME HEALTH SERVICES      DC Planning Services  CM consult      PAC Choice  DURABLE MEDICAL EQUIPMENT  HOME HEALTH   Choice offered to / List presented to:     DME arranged  WALKER - ROLLING  3-N-1      DME agency  [facility]     HH arranged  HH-2 PT      [facility] agency  [facility]   Status of service:  Completed, signed off Medicare Important Message given?   (If response is "NO", the following Medicare IM given date fields will be blank) Date Medicare IM given:   Date Additional Medicare IM given:    Discharge Disposition:  HOME W HOME HEALTH SERVICES  Per UR Regulation:    If discussed at Long Length of Stay Meetings, dates discussed:    Comments:   Patient will be going to her daughter's home at discharge: [address], [place], [place]     [phone]

## 2012-11-12 NOTE — Evaluation (Signed)
Physical Therapy Evaluation Patient Details Name: Kimberly Cowan MRN: [id number] DOB: 14-May-1966 Today's Date: 11/12/2012 Time: 8295-6213 PT Time Calculation (min): 42 min  PT Assessment / Plan / Recommendation Clinical Impression  Pt. is an obese woman who underwent L5-S1 PLIF due to spondylosis and stenosis.  She now presents to PT with pain and dependencies in all aspects of her gross mobility.  She will need acute PT to address these and below issues for safe DC to her daughter's home.    PT Assessment  Patient needs continued PT services    Follow Up Recommendations  Home health PT;Supervision/Assistance - 24 hour;Supervision for mobility/OOB;Other (comment) (HHPt is dependent on her progress)    Does the patient have the potential to tolerate intense rehabilitation      Barriers to Discharge None daughter plans to take a few days off for initial 24/7 assist for her mom.    Equipment Recommendations  Rolling walker with 5" wheels    Recommendations for Other Services     Frequency Min 5X/week    Precautions / Restrictions Precautions Precautions: Back;Fall Precaution Booklet Issued: Yes (comment) Precaution Comments: instructed pt.in BAT back precautions and log rolling Required Braces or Orthoses: Spinal Brace Spinal Brace: Lumbar corset;Applied in sitting position Restrictions Weight Bearing Restrictions: No   Pertinent Vitals/Pain Pain in low back, rated 7/10.  Rn provided pain med during session and pt. Was positioned for comfort following gait training.      Mobility  Bed Mobility Bed Mobility: Rolling Right;Right Sidelying to Sit;Sitting - Scoot to Edge of Bed;Sit to Supine Rolling Right: 4: Min assist;With rail Right Sidelying to Sit: 3: Mod assist;HOB elevated;Other (comment) (HOB 20 degrees) Sitting - Scoot to Edge of Bed: 5: Supervision Sit to Supine: Not Tested (comment) Details for Bed Mobility Assistance: VC's for technique and log rolling  technique; min assist with use of bed pad to fcilitate roll, min assist for LE's over edge of bed Transfers Transfers: Sit to Stand;Stand to Sit Sit to Stand: 4: Min assist;From bed;With upper extremity assist Stand to Sit: 4: Min assist;With armrests;To chair/3-in-1 Details for Transfer Assistance: cues of rtechnique and spinal precautions Ambulation/Gait Ambulation/Gait Assistance: 4: Min assist Ambulation Distance (Feet): 10 Feet Assistive device: Rolling walker Ambulation/Gait Assistance Details: cues and assist for advancing RW, cues for upright posture Gait Pattern: Step-through pattern;Decreased step length - right;Decreased step length - left;Trunk flexed Gait velocity: slow Stairs: No Wheelchair Mobility Wheelchair Mobility: No    Exercises General Exercises - Lower Extremity Ankle Circles/Pumps: AROM;Both;10 reps;Seated   PT Diagnosis: Difficulty walking;Abnormality of gait;Acute pain  PT Problem List: Decreased activity tolerance;Decreased mobility;Decreased knowledge of use of DME;Decreased knowledge of precautions;Obesity;Pain PT Treatment Interventions: DME instruction;Gait training;Stair training;Functional mobility training;Therapeutic activities;Therapeutic exercise;Patient/family education   PT Goals Acute Rehab PT Goals PT Goal Formulation: With patient Time For Goal Achievement: 11/19/12 Potential to Achieve Goals: Good Pt will Roll Supine to Right Side: Independently PT Goal: Rolling Supine to Right Side - Progress: Goal set today Pt will Roll Supine to Left Side: Independently PT Goal: Rolling Supine to Left Side - Progress: Goal set today Pt will go Supine/Side to Sit: with modified independence;with HOB 0 degrees PT Goal: Supine/Side to Sit - Progress: Goal set today Pt will go Sit to Supine/Side: with modified independence PT Goal: Sit to Supine/Side - Progress: Goal set today Pt will go Sit to Stand: with modified independence PT Goal: Sit to Stand -  Progress: Goal set today Pt will go Stand to  Sit: with modified independence PT Goal: Stand to Sit - Progress: Goal set today Pt will Transfer Bed to Chair/Chair to Bed: with modified independence PT Transfer Goal: Bed to Chair/Chair to Bed - Progress: Goal set today Pt will Ambulate: 51 - 150 feet;with modified independence;with least restrictive assistive device PT Goal: Ambulate - Progress: Goal set today Pt will Go Up / Down Stairs: 3-5 stairs;with min assist;with rail(s) PT Goal: Up/Down Stairs - Progress: Goal set today Additional Goals Additional Goal #1: pt. will state and comply with BAT back precautions and log rolling technique PT Goal: Additional Goal #1 - Progress: Goal set today  Visit Information  Last PT Received On: 11/12/12 Assistance Needed: +1    Subjective Data  Subjective: I haven't worked in 2 years.  Pt. reports occasional falls from "legs going numb" Patient Stated Goal: walk without fear of falling   Prior Functioning  Home Living Lives With: Daughter;Other (Comment) (and son in law) Available Help at Discharge: Family;Available PRN/intermittently Type of Home: House Home Access: Stairs to enter [employer] of Steps: 5 Entrance Stairs-Rails: Right;Left;Can reach both Home Layout: One level Bathroom Shower/Tub: Forensic scientist: Standard Bathroom Accessibility: Yes How Accessible: Accessible via walker Home Adaptive Equipment: Straight cane Prior Function Level of Independence: Independent with assistive device(s) Able to Take Stairs?: Yes Driving: Yes Vocation: Other (comment) Comments: has applied for disability, awaiting hearing Communication Communication: No difficulties Dominant Hand: Left    Cognition  Cognition Overall Cognitive Status: Appears within functional limits for tasks assessed/performed Arousal/Alertness: Awake/alert Orientation Level: Oriented X4 / Intact Behavior During Session: WFL for  tasks performed Cognition - Other Comments: tearful from pain but motivated    Extremity/Trunk Assessment Right Upper Extremity Assessment RUE ROM/Strength/Tone: [facility] for tasks assessed Left Upper Extremity Assessment LUE ROM/Strength/Tone: WFL for tasks assessed Right Lower Extremity Assessment RLE ROM/Strength/Tone: WFL for tasks assessed RLE Sensation: WFL - Light Touch Left Lower Extremity Assessment LLE ROM/Strength/Tone: WFL for tasks assessed LLE Sensation: WFL - Light Touch Trunk Assessment Trunk Assessment: Normal   Balance    End of Session PT - End of Session Equipment Utilized During Treatment: Gait belt;Back brace Activity Tolerance: Patient limited by pain Patient left: in chair;with call bell/phone within reach;with nursing in room;with family/visitor present Nurse Communication: Mobility status;Other (comment) (RN provided pain med during session)  GP     Ferman Hamming 11/12/2012, 10:22 AM Weldon Picking PT Acute Rehab Services [phone] Bee[place] [phone]

## 2012-11-12 NOTE — Progress Notes (Signed)
Occupational Therapy Treatment Patient Details Name: Kimberly Cowan MRN: [id number] DOB: June 11, 1966 Today's Date: 11/12/2012 Time: 5621-3086 OT Time Calculation (min): 18 min  OT Assessment / Plan / Recommendation Comments on Treatment Session Excellent session. Pt increasing mobility. will continue in am with ADL retraining with AE and DME.     Follow Up Recommendations  Home health OT    Barriers to Discharge  None    Equipment Recommendations  3 in 1 bedside comode;Tub/shower bench    Recommendations for Other Services    Frequency Min 3X/week   Plan Discharge plan remains appropriate    Precautions / Restrictions Precautions Precautions: Back;Fall Precaution Booklet Issued: Yes (comment) Precaution Comments: pt recalled 3/3 precautions from earlier session Required Braces or Orthoses: Spinal Brace Spinal Brace: Lumbar corset;Applied in sitting position (pt independent in donning/doffing corsett)   Pertinent Vitals/Pain 3. back    ADL  Grooming: Set up;Supervision/safety Where Assessed - Grooming: Supported sitting Upper Body Bathing: Set up Where Assessed - Upper Body Bathing: Unsupported sitting Lower Body Bathing: Maximal assistance Where Assessed - Lower Body Bathing: Supported sit to stand Upper Body Dressing: Set up Where Assessed - Upper Body Dressing: Unsupported sitting Lower Body Dressing: Maximal assistance Where Assessed - Lower Body Dressing: Supported sit to stand Toilet Transfer: Minimal assistance Toilet Transfer Method: Sit to Barista: Bedside commode;Other (comment) (over toilet) Toileting - Clothing Manipulation and Hygiene: Moderate assistance Where Assessed - Toileting Clothing Manipulation and Hygiene: Standing (A with hygiene) Equipment Used: Gait belt;Rolling walker Transfers/Ambulation Related to ADLs: min A ADL Comments: Demonstrated use of AE for LB ADL during ADL session. Also discussed use of toilet aid to  help adhere to back precautions during hygiene after toileting.     OT Diagnosis: Generalized weakness;Acute pain  OT Problem List: Decreased strength;Decreased range of motion;Decreased activity tolerance;Decreased safety awareness;Decreased knowledge of use of DME or AE;Decreased knowledge of precautions;Obesity;Pain OT Treatment Interventions: Self-care/ADL training;Energy conservation;DME and/or AE instruction;Therapeutic activities;Patient/family education   OT Goals Acute Rehab OT Goals OT Goal Formulation: With patient Time For Goal Achievement: 11/19/12 Potential to Achieve Goals: Good ADL Goals Pt Will Perform Lower Body Bathing: with supervision;with set-up;with caregiver independent in assisting;Sit to stand from bed;Unsupported;with adaptive equipment ADL Goal: Lower Body Bathing - Progress: Progressing toward goals Pt Will Perform Lower Body Dressing: with set-up;with supervision;with caregiver independent in assisting;Sit to stand from bed;Unsupported;with adaptive equipment ADL Goal: Lower Body Dressing - Progress: Progressing toward goals Pt Will Transfer to Toilet: with modified independence;with DME;Ambulation;3-in-1;Maintaining back safety precautions ADL Goal: Toilet Transfer - Progress: Progressing toward goals Pt Will Perform Toileting - Clothing Manipulation: with modified independence;Standing;Sitting on 3-in-1 or toilet;with adaptive equipment ADL Goal: Toileting - Clothing Manipulation - Progress: Progressing toward goals Pt Will Perform Toileting - Hygiene: with modified independence;Sit to stand from 3-in-1/toilet;with adaptive equipment ADL Goal: Toileting - Hygiene - Progress: Progressing toward goals Pt Will Perform Tub/Shower Transfer: with supervision;with caregiver independent in assisting;Ambulation;with DME;Transfer tub bench;Maintaining back safety precautions ADL Goal: Tub/Shower Transfer - Progress: Progressing toward goals Additional ADL Goal #1: Pt  will independently demonstrate back precautions during ADL and mobility ADL Goal: Additional Goal #1 - Progress: Met  Visit Information  Last OT Received On: 11/12/12 Assistance Needed: +1    Subjective Data      Prior Functioning  Home Living Lives With: Daughter;Other (Comment) Available Help at Discharge: Family;Available PRN/intermittently Type of Home: House Home Access: Stairs to enter Entrance Stairs-Rails: Right;Left;Can reach both Home Layout: One level  Bathroom Shower/Tub: Counselling psychologist: Yes How Accessible: Accessible via walker Home Adaptive Equipment: Straight cane Prior Function Level of Independence: Independent with assistive device(s) Able to Take Stairs?: Yes Driving: Yes Vocation: Other (comment) Comments: awaiting disability Communication Communication: No difficulties Dominant Hand: Left    Cognition  Cognition Overall Cognitive Status: Appears within functional limits for tasks assessed/performed Arousal/Alertness: Awake/alert Orientation Level: Oriented X4 / Intact Behavior During Session: [facility] for tasks performed    Mobility  Bed Mobility Bed Mobility: Sit to Supine;Sit to Sidelying Left Rolling Left: 5: Supervision;With rail Left Sidelying to Sit: 4: Min assist;HOB flat;With rails Sitting - Scoot to Edge of Bed: 5: Supervision Sit to Supine: 2: Max assist;HOB flat;With rail Sit to Sidelying Left: 2: Max assist;HOB flat;With rail;Other (comment) (a to lift BLE back onto bed this pm. HEAVY legs.) Details for Bed Mobility Assistance: vc for technique of log rolling.  Transfers Transfers: Sit to Stand;Stand to Sit Sit to Stand: 5: Supervision;With upper extremity assist;From chair/3-in-1 Stand to Sit: 5: Supervision;With upper extremity assist;To bed Details for Transfer Assistance: improved mobility    Exercises      Balance     End of Session OT - End of Session Equipment  Utilized During Treatment: Gait belt;Back brace Activity Tolerance: Patient tolerated treatment well Patient left: in bed;with call bell/phone within reach Nurse Communication: Mobility status;Precautions;Other (comment) (equipment needs)  GO     Tamika Nou,HILLARY 11/12/2012, 5:59 PM [facility], OTR/L  [phone] 11/12/2012

## 2012-11-13 NOTE — Progress Notes (Signed)
Occupational Therapy Treatment Patient Details Name: Kimberly Cowan MRN: [id number] DOB: 1966-01-26 Today's Date: 11/13/2012 Time: 4098-1191 OT Time Calculation (min): 58 min  OT Assessment / Plan / Recommendation Comments on Treatment Session Excellent progress. Pt issued AE due to financial status. Pt mod I with ADL with AE. Pt would benefit from tub bnech and 3 in 1. Pt appropriate for D/C tomorrow.    Follow Up Recommendations  Home health OT    Barriers to Discharge   none    Equipment Recommendations  3 in 1 bedside comode;Tub/shower bench    Recommendations for Other Services  none  Frequency Min 3X/week   Plan Discharge plan remains appropriate    Precautions / Restrictions Precautions Precautions: Back Precaution Comments: pt recalled 3/3 precautions from earlier session Required Braces or Orthoses: Spinal Brace Spinal Brace: Lumbar corset;Applied in sitting position Restrictions Weight Bearing Restrictions: No   Pertinent Vitals/Pain 3. Back.    ADL  Grooming: Modified independent Where Assessed - Grooming: Unsupported standing Upper Body Bathing: Set up Where Assessed - Upper Body Bathing: Unsupported sitting Lower Body Bathing: Set up;Supervision/safety Where Assessed - Lower Body Bathing: Unsupported sit to stand Upper Body Dressing: Set up Where Assessed - Upper Body Dressing: Unsupported sitting Lower Body Dressing: Set up;Supervision/safety Where Assessed - Lower Body Dressing: Unsupported sit to stand Toilet Transfer: Supervision/safety Toilet Transfer Method: Sit to Barista: Bedside commode Toileting - Clothing Manipulation and Hygiene: Supervision/safety Where Assessed - Toileting Clothing Manipulation and Hygiene: Sit to stand from 3-in-1 or toilet Equipment Used: Back brace;Reacher;Rolling walker;Sock aid;Long-handled sponge Transfers/Ambulation Related to ADLs: S ADL Comments: return demonstration of use of AE for ADL     OT Diagnosis:    OT Problem List:   OT Treatment Interventions:     OT Goals Acute Rehab OT Goals OT Goal Formulation: With patient Time For Goal Achievement: 11/19/12 Potential to Achieve Goals: Good ADL Goals Pt Will Perform Lower Body Bathing: with supervision;with set-up;with caregiver independent in assisting;Sit to stand from bed;Unsupported;with adaptive equipment ADL Goal: Lower Body Bathing - Progress: Met Pt Will Perform Lower Body Dressing: with set-up;with supervision;with caregiver independent in assisting;Sit to stand from bed;Unsupported;with adaptive equipment ADL Goal: Lower Body Dressing - Progress: Met Pt Will Transfer to Toilet: with modified independence;with DME;Ambulation;3-in-1;Maintaining back safety precautions ADL Goal: Toilet Transfer - Progress: Progressing toward goals Pt Will Perform Toileting - Clothing Manipulation: with modified independence;Standing;Sitting on 3-in-1 or toilet;with adaptive equipment ADL Goal: Toileting - Clothing Manipulation - Progress: Met Pt Will Perform Toileting - Hygiene: with modified independence;Sit to stand from 3-in-1/toilet;with adaptive equipment ADL Goal: Toileting - Hygiene - Progress: Met Pt Will Perform Tub/Shower Transfer: with supervision;with caregiver independent in assisting;Ambulation;with DME;Transfer tub bench;Maintaining back safety precautions ADL Goal: Tub/Shower Transfer - Progress: Progressing toward goals Additional ADL Goal #1: Pt will independently demonstrate back precautions during ADL and mobility ADL Goal: Additional Goal #1 - Progress: Met  Visit Information  Last OT Received On: 11/13/12 Assistance Needed: +1    Subjective Data      Prior Functioning       Cognition  Cognition Overall Cognitive Status: Appears within functional limits for tasks assessed/performed Arousal/Alertness: Awake/alert Orientation Level: Oriented X4 / Intact    Mobility  Bed Mobility Sit to Supine: 3:  Mod assist;HOB flat Sit to Sidelying Left: 3: Mod assist;HOB flat Details for Bed Mobility Assistance: a to lift legs Transfers Transfers: Sit to Stand;Stand to Sit Sit to Stand: 5: Supervision;With upper extremity assist;From  chair/3-in-1 Stand to Sit: 6: Modified independent (Device/Increase time);With upper extremity assist;To bed Details for Transfer Assistance: daughter will be able to assist with legs after d/C    Exercises      Balance  WFL   End of Session OT - End of Session Equipment Utilized During Treatment: Gait belt;Back brace Activity Tolerance: Patient tolerated treatment well Patient left: in bed;with call bell/phone within reach Nurse Communication: Mobility status  GO     Nicle Connole,HILLARY 11/13/2012, 11:05 AM Luisa Dago, OTR/L  [phone] 11/13/2012

## 2012-11-13 NOTE — Progress Notes (Addendum)
PT Cancellation Note  Patient Details Name: Kimberly Cowan MRN: [id number] DOB: January 12, 1966   Cancelled Treatment:    Reason Eval/Treat Not Completed: Medical issues which prohibited therapy;Other (comment) (pt eating lunch, PT to check back later)   Lurena Joiner B. Kolette Vey, PT, DPT [phone]   11/13/2012, 12:26 PM

## 2012-11-13 NOTE — Progress Notes (Signed)
Doing well. C/o appropriate incisional soreness. Less leg pain, Numbness No Nausea /vomiting Amb/ voiding well  Temp:  [97.8 F (36.6 C)-100.6 F (38.1 C)] 100.2 F (37.9 C) (02/16 0441) Pulse Rate:  [78-108] 106 (02/16 0441) Resp:  [18-20] 18 (02/16 0441) BP: (117-148)/(58-75) 129/67 mmHg (02/16 0441) SpO2:  [93 %-98 %] 94 % (02/16 0441) Weight:  [134.08 kg (295 lb 9.5 oz)] 134.08 kg (295 lb 9.5 oz) (02/15 1300) Good strength and sensation Incision CDI  Plan: Increase activity - ? D/C in am

## 2012-11-13 NOTE — Progress Notes (Signed)
Physical Therapy Treatment Patient Details Name: Kimberly Cowan MRN: [id number] DOB: 14-Jun-1966 Today's Date: 11/13/2012 Time: 4098-1191 PT Time Calculation (min): 23 min  PT Assessment / Plan / Recommendation Comments on Treatment Session  47 y.o. female s/p PLIF who is very limited in her mobility due to pain and bil leg weakness.  She is progressing better with gait today and verbalizes that she is ready to go home with her daughter's assist tomorrow.  She will need to practice stairs before discharge from the hospital tomorrow morning as she has 5 STE her daughters home.      Follow Up Recommendations  Home health PT     Does the patient have the potential to tolerate intense rehabilitation   NA  Barriers to Discharge  none      Equipment Recommendations  Rolling walker with 5" wheels    Recommendations for Other Services  none  Frequency Min 5X/week   Plan Discharge plan remains appropriate;Frequency remains appropriate    Precautions / Restrictions Precautions Precautions: Back Required Braces or Orthoses: Spinal Brace Spinal Brace: Lumbar corset;Applied in sitting position   Pertinent Vitals/Pain 7/10 surgical back and bil leg pain, pre medicated, repositioned on pillows at end of session.      Mobility  Bed Mobility Rolling Left: 6: Modified independent (Device/Increase time);With rail Right Sidelying to Sit: 6: Modified independent (Device/Increase time);With rails Sitting - Scoot to Edge of Bed: 6: Modified independent (Device/Increase time);With rail Sit to Sidelying Left: 3: Mod assist;HOB flat;With rail Details for Bed Mobility Assistance: heavy reliance on railing to get up to side of bed. Assist needed to lift both legs up into the bed to get back to left side lying and then supine.   Transfers Sit to Stand: 5: Supervision Stand to Sit: 5: Supervision Details for Transfer Assistance: supervision for safety due to weak and painful bil legs and pt very slow  moving during theses transitions.  Ambulation/Gait Ambulation/Gait Assistance: 4: Min guard Ambulation Distance (Feet): 100 Feet Assistive device: Rolling walker Ambulation/Gait Assistance Details: no cues needed.   Gait Pattern: Step-through pattern;Shuffle Gait velocity: less than 1.8 ft/sec putting her at risk for recurrent falls     PT Goals Acute Rehab PT Goals PT Goal Formulation: With patient PT Goal: Rolling Supine to Left Side - Progress: Progressing toward goal PT Goal: Supine/Side to Sit - Progress: Progressing toward goal PT Goal: Sit to Supine/Side - Progress: Progressing toward goal PT Goal: Sit to Stand - Progress: Progressing toward goal PT Goal: Stand to Sit - Progress: Progressing toward goal PT Transfer Goal: Bed to Chair/Chair to Bed - Progress: Progressing toward goal PT Goal: Ambulate - Progress: Progressing toward goal  Visit Information  Last PT Received On: 11/13/12 Assistance Needed: +1    Subjective Data  Subjective: Pt reports that she is ready to walk despite pain.   Patient Stated Goal: decrease pain   Cognition  Cognition Overall Cognitive Status: Appears within functional limits for tasks assessed/performed Arousal/Alertness: Awake/alert       End of Session PT - End of Session Equipment Utilized During Treatment: Back brace Activity Tolerance: Patient limited by pain Patient left: in bed;with call bell/phone within reach    Goodell B. Mayci Haning, PT, DPT [phone]   11/13/2012, 5:30 PM

## 2012-11-14 NOTE — Progress Notes (Signed)
Occupational Therapy Treatment Patient Details Name: Kimberly Cowan MRN: [id number] DOB: April 10, 1966 Today's Date: 11/14/2012 Time: 7829-5621 OT Time Calculation (min): 22 min  OT Assessment / Plan / Recommendation Comments on Treatment Session Pt declining tub transfer bench practice due to pain this session. Moves slowly, but with supervision to modified independence.  Generalizing back precautions.    Follow Up Recommendations  Home health OT    Barriers to Discharge       Equipment Recommendations  3 in 1 bedside comode;Tub/shower bench    Recommendations for Other Services    Frequency Min 3X/week   Plan Discharge plan remains appropriate    Precautions / Restrictions Precautions Precautions: Back Precaution Comments: Pt adhering to back precautions throughout session. Required Braces or Orthoses: Spinal Brace Spinal Brace: Lumbar corset;Applied in sitting position Restrictions Weight Bearing Restrictions: No   Pertinent Vitals/Pain Back pain, RN notified, repositioned    ADL  Grooming: Wash/dry hands;Set up Where Assessed - Grooming: Unsupported sitting Upper Body Dressing: Minimal assistance (back brace) Where Assessed - Upper Body Dressing: Unsupported sitting Toilet Transfer: Supervision/safety Toilet Transfer Method: Sit to stand Toilet Transfer Equipment: Raised toilet seat with arms (or 3-in-1 over toilet) Toileting - Clothing Manipulation and Hygiene: Supervision/safety Where Assessed - Toileting Clothing Manipulation and Hygiene: Sit to stand from 3-in-1 or toilet Tub/Shower Transfer:  (pt declined tub bench transfer practice this visit) Equipment Used: Back brace;Rolling walker Transfers/Ambulation Related to ADLs: supervision with RW    OT Diagnosis:    OT Problem List:   OT Treatment Interventions:     OT Goals ADL Goals Pt Will Perform Lower Body Bathing: with supervision;with set-up;with caregiver independent in assisting;Sit to stand from  bed;Unsupported;with adaptive equipment Pt Will Perform Lower Body Dressing: with set-up;with supervision;with caregiver independent in assisting;Sit to stand from bed;Unsupported;with adaptive equipment Pt Will Transfer to Toilet: with modified independence;with DME;Ambulation;3-in-1;Maintaining back safety precautions ADL Goal: Toilet Transfer - Progress: Progressing toward goals Pt Will Perform Toileting - Clothing Manipulation: with modified independence;Standing;Sitting on 3-in-1 or toilet;with adaptive equipment ADL Goal: Toileting - Clothing Manipulation - Progress: Progressing toward goals Pt Will Perform Toileting - Hygiene: with modified independence;Sit to stand from 3-in-1/toilet;with adaptive equipment ADL Goal: Toileting - Hygiene - Progress: Met Pt Will Perform Tub/Shower Transfer: with supervision;with caregiver independent in assisting;Ambulation;with DME;Transfer tub bench;Maintaining back safety precautions Additional ADL Goal #1: Pt will independently demonstrate back precautions during ADL and mobility ADL Goal: Additional Goal #1 - Progress: Met  Visit Information  Last OT Received On: 11/14/12 Assistance Needed: +1    Subjective Data      Prior Functioning       Cognition  Cognition Overall Cognitive Status: Appears within functional limits for tasks assessed/performed Arousal/Alertness: Awake/alert Orientation Level: Appears intact for tasks assessed Behavior During Session: [facility] for tasks performed    Mobility  Bed Mobility Bed Mobility: Rolling Left;Left Sidelying to Sit;Sitting - Scoot to Edge of Bed;Sit to Sidelying Left  Rolling Left: 6: Modified independent (Device/Increase time);With rail Left Sidelying to Sit: 6: Modified independent (Device/Increase time);With rails;HOB elevated Sitting - Scoot to Edge of Bed: 6: Modified independent (Device/Increase time Sit to Sidelying Left: 4: Min guard Details for Bed Mobility Assistance: Relies heavily on  rail, bed slightly elevated. Transfers Transfers: Sit to Stand;Stand to Sit Sit to Stand: 6: Modified independent (Device/Increase time);With armrests;From bed;From chair/3-in-1 Stand to Sit: To chair/3-in-1;With upper extremity assist;6: Modified independent (Device/Increase time);To bed Details for Transfer Assistance: Pt requires increased time for all aspects of  mobility    Exercises      Balance     End of Session OT - End of Session Patient left: in bed;with call bell/phone within reach Nurse Communication: Patient requests pain meds (pt having chills)  GO     Evern Bio 11/14/2012, 2:49 PM

## 2012-11-14 NOTE — Progress Notes (Signed)
Physical Therapy Treatment Patient Details Name: Kimberly Cowan MRN: [id number] DOB: May 13, 1966 Today's Date: 11/14/2012 Time: 1191-4782 PT Time Calculation (min): 24 min  PT Assessment / Plan / Recommendation Comments on Treatment Session  Pt continues to c/o of pain but agreeable to participate after 3 attempts and pain meds. She is progressing with increased gait. Initaited stair training to prepare for D/C home tomorrow. Good return demo of technique. Pt states that she feels safe and comfortable with steps and  in home ambulation with family assistance.        Follow Up Recommendations  Home health PT           Equipment Recommendations  Rolling walker with 5" wheels    Recommendations for Other Services    Frequency Min 5X/week   Plan Discharge plan remains appropriate;Frequency remains appropriate    Precautions / Restrictions Precautions Precautions: Back Precaution Comments: Pt able to recall 3/3 precautions and maintain throughout mobility. Pt able to don/doff brace Independently Required Braces or Orthoses: Spinal Brace Spinal Brace: Lumbar corset;Applied in sitting position Restrictions Weight Bearing Restrictions: No   Pertinent Vitals/Pain 8/10 pain reported. Pt premedicated and repositioned in comfortable position in chair post treatment.    Mobility  Bed Mobility Rolling Left: 6: Modified independent (Device/Increase time);With rail Right Sidelying to Sit: 6: Modified independent (Device/Increase time) Sitting - Scoot to Edge of Bed: 6: Modified independent (Device/Increase time) Sit to Supine: 4: Min assist;HOB flat Details for Bed Mobility Assistance: Continues to rely heavily on rails. Trialed bed mobility with HOB flat without rails.  Transfers Sit to Stand: 6: Modified independent (Device/Increase time);From chair/3-in-1;With armrests Stand to Sit: 5: Supervision;To chair/3-in-1;With upper extremity assist Details for Transfer Assistance: Pt requires  increased time for all aspects of mobility Ambulation/Gait Ambulation Distance (Feet): 200 Feet Assistive device: Rolling walker Gait Pattern: Step-through pattern General Gait Details: Pt ambulates very slow and cautious causing a very slow gait  Stairs: Yes Stairs Assistance: 4: Min guard Stair Management Technique: Two rails;Step to pattern;Forwards Number of Stairs: 5 (x2)      PT Goals Acute Rehab PT Goals PT Goal: Rolling Supine to Right Side - Progress: Progressing toward goal PT Goal: Rolling Supine to Left Side - Progress: Progressing toward goal PT Goal: Supine/Side to Sit - Progress: Progressing toward goal PT Goal: Sit to Stand - Progress: Met PT Goal: Stand to Sit - Progress: Progressing toward goal PT Goal: Ambulate - Progress: Progressing toward goal PT Goal: Up/Down Stairs - Progress: Progressing toward goal Additional Goals PT Goal: Additional Goal #1 - Progress: Met  Visit Information  Last PT Received On: 11/14/12 Assistance Needed: +1       Cognition  Cognition Overall Cognitive Status: Appears within functional limits for tasks assessed/performed Arousal/Alertness: Awake/alert Orientation Level: Appears intact for tasks assessed Behavior During Session: [facility] for tasks performed       End of Session PT - End of Session Equipment Utilized During Treatment: Back brace Activity Tolerance: Patient tolerated treatment well Patient left: in chair;with call bell/phone within reach;with nursing in room   GP     Lazaro Arms 11/14/2012, 2:17 PM

## 2012-11-14 NOTE — Progress Notes (Signed)
ANTIBIOTIC CONSULT NOTE - INITIAL  Pharmacy Consult for vancomycin Indication: surgical prophylaxis  Allergies  Allergen Reactions  . Morphine And Related Hives, Nausea And Vomiting and Other (See Comments)    "can't breathe"  . Penicillins Hives, Nausea And Vomiting and Other (See Comments)    "can't breathe"    Patient Measurements: Height: 5\' 7"  (170.2 cm) Weight: 295 lb 9.5 oz (134.08 kg) IBW/kg (Calculated) : 61.6  Vital Signs: Temp: 98.1 F (36.7 C) (02/17 1403) Temp src: Oral (02/17 1403) BP: 119/73 mmHg (02/17 1403) Pulse Rate: 96 (02/17 1403) Intake/Output from previous day: 02/16 0701 - 02/17 0700 In: 1638 [P.O.:600; I.V.:3; IV Piggyback:1000] Out: -  Intake/Output from this shift:    Labs: No results found for this basename: WBC, HGB, PLT, LABCREA, CREATININE,  in the last 72 hours Estimated Creatinine Clearance: 128.4 ml/min (by C-G formula based on Cr of 0.73). No results found for this basename: VANCOTROUGH, VANCOPEAK, VANCORANDOM, GENTTROUGH, GENTPEAK, GENTRANDOM, TOBRATROUGH, TOBRAPEAK, TOBRARND, AMIKACINPEAK, AMIKACINTROU, AMIKACIN,  in the last 72 hours   Microbiology: No results found for this or any previous visit (from the past 720 hour(s)).  Medical History: Past Medical History  Diagnosis Date  . Sleep apnea     no cpcap     last sleep study > 4 yrs  . Anxiety   . Depression   . Arthritis   . Headache     Medications:  Anti-infectives   Start     Dose/Rate Route Frequency Ordered Stop   11/11/12 2000  vancomycin (VANCOCIN) 1,250 mg in sodium chloride 0.9 % 250 mL IVPB     1,250 mg 166.7 mL/hr over 90 Minutes Intravenous Every 12 hours 11/11/12 1428     11/11/12 0837  bacitracin 50,000 Units in sodium chloride irrigation 0.9 % 500 mL irrigation  Status:  Discontinued       As needed 11/11/12 0838 11/11/12 1134   11/11/12 0733  vancomycin (VANCOCIN) 1 GM/200ML IVPB    Comments:  Kimberly Cowan: cabinet override      11/11/12 0733  11/11/12 0750   11/11/12 0714  bacitracin 16109 UNITS injection    Comments:  Kimberly Cowan: cabinet override      11/11/12 0714 11/11/12 1914     Assessment: 46 yof with history of multiple back surgeries presents with worsening back pain. Now s/p posterior lumbar fusion with a hemovac drain in the epidural space.Started empiric vancomycin for surgical prophylaxis with drain in place. Pt with tmax of 100.1 but no recent CBC. No cultures available.  Drain has been discontinued, vancomycin continued per protocol. Renal function was normal pta. Will continue current dosing with d/c plans likely 24-48h.  Goal of Therapy:  Vancomycin trough level 10-15 mcg/ml  Plan:  1. Vancomycin 1250mg  IV Q12H 2. F/u renal fxn, C&S, clinical status and trough 3. F/u LOT  Kimberly Cowan 11/14/2012,2:42 PM

## 2012-11-14 NOTE — Progress Notes (Signed)
Kimberly Cowan, PTA [phone] 11/14/2012  

## 2012-11-14 NOTE — Progress Notes (Signed)
Patient with some low grade fevers this evening that improved after oxycodone/acetiminophen given. No Incentive spirometers on floor. Notified respiratory to deliver.  Awaiting arrival.  Will continue to monitor.

## 2012-11-14 NOTE — Progress Notes (Signed)
Doing well. C/o appropriate incisional soreness. +flatus, no BM Amb/ voiding well  Temp:  [98.5 F (36.9 C)-100.1 F (37.8 C)] 98.5 F (36.9 C) (02/17 0600) Pulse Rate:  [97-108] 97 (02/17 0600) Resp:  [20] 20 (02/17 0600) BP: (100-141)/(50-78) 115/59 mmHg (02/17 0600) SpO2:  [95 %-100 %] 100 % (02/17 0600) Good strength and sensation Incision CDI  Plan: Pt worried about temp -  No Bm -    Increase activity  - d/c next couple days

## 2012-11-15 MED ORDER — CYCLOBENZAPRINE HCL 10 MG PO TABS: 10.0000 mg | ORAL_TABLET | Freq: Three times a day (TID) | ORAL | Status: DC | PRN

## 2012-11-15 MED ORDER — OXYCODONE-ACETAMINOPHEN 5-325 MG PO TABS: 1.0000 | ORAL_TABLET | ORAL | Status: DC | PRN

## 2012-11-15 MED FILL — Sodium Chloride IV Soln 0.9%: INTRAVENOUS | Qty: 2000 | Status: AC

## 2012-11-15 MED FILL — Heparin Sodium (Porcine) Inj 1000 Unit/ML: INTRAMUSCULAR | Qty: 30 | Status: AC

## 2012-11-15 NOTE — Discharge Summary (Signed)
Physician Discharge Summary  Patient ID: Kimberly Cowan MRN: [id number] DOB/AGE: July 08, 1966 47 y.o.  Admit date: 11/11/2012 Discharge date: 11/15/2012  Admission Diagnoses:L5-S1 spondylosis with stenosis and neurogenic claudication involving both L5 and S1 nerve roots. status post previous left-sided L5-S1 microdiscectomy.      Discharge Diagnoses: L5-S1 spondylosis with stenosis and neurogenic claudication involving both L5 and S1 nerve roots. status post previous left-sided L5-S1 microdiscectomy.     Principal Problem:   Spinal stenosis, lumbar region, with neurogenic claudication   Discharged Condition: good  Hospital Course: pt admitted on day of surgery - underwent procedure below - pt doing well, ambulating, eating, voiding  Consults: None  Significant Diagnostic Studies: none  Treatments: surgery:Procedure Name: Reexploration of L5-S1 laminotomy with bilateral complete L5-S1 decompressive laminectomy and bilateral L5 and S1 foraminotomies, more than would be required for simple interbody fusion alone.  L5-S1 posterior lumbar interbody fusion utilizing tangent interbody allograft wedge Telamon interbody peek cage and local autograft.  L5-S1 posterior lateral arthrodesis utilizing nonsegmental pedicle screw instrumentation and local autograft.      Discharge Exam: Blood pressure 112/62, pulse 70, temperature 97.9 F (36.6 C), temperature source Oral, resp. rate 18, height 5\' 7"  (1.702 m), weight 134.08 kg (295 lb 9.5 oz), SpO2 98.00%. Wound:c/d/i  Disposition: home     Medication List    TAKE these medications       cyclobenzaprine 10 MG tablet  Commonly known as:  FLEXERIL  Take 1 tablet (10 mg total) by mouth 3 (three) times daily as needed for muscle spasms.     HYDROcodone-acetaminophen 10-325 MG per tablet  Commonly known as:  NORCO  Take 1 tablet by mouth every 6 (six) hours as needed. For pain     oxyCODONE-acetaminophen 5-325 MG per tablet   Commonly known as:  PERCOCET/ROXICET  Take 1-2 tablets by mouth every 4 (four) hours as needed for pain.         Signed: Clydene Fake, MD 11/15/2012, 9:46 AM

## 2012-11-15 NOTE — Progress Notes (Signed)
NCM notified [facility] of scheduled dc home today. Faxed HH orders and dc summary to [facility]. [facility] contact info on dc instructions. Isidoro Donning RN CCM Case Mgmt phone [phone]

## 2012-11-15 NOTE — Progress Notes (Signed)
Occupational Therapy Treatment and D/C Patient Details Name: Kimberly Cowan MRN: [id number] DOB: [birth year] Today's Date: 11/15/2012 Time: 7829-5621 OT Time Calculation (min): 29 min  OT Assessment / Plan / Recommendation Comments on Treatment Session Pt appropriate for d/c home.  Will sign off.    Follow Up Recommendations  Home health OT    Barriers to Discharge       Equipment Recommendations  3 in 1 bedside comode;Tub/shower bench    Recommendations for Other Services    Frequency Min 3X/week   Plan Discharge plan remains appropriate;All goals met and education completed, patient discharged from OT services    Precautions / Restrictions Precautions Precautions: Back Precaution Comments: Pt adhering to back precautions throughout session. Required Braces or Orthoses: Spinal Brace Spinal Brace: Lumbar corset;Applied in sitting position   Pertinent Vitals/Pain See vitals     ADL  Grooming: Performed;Wash/dry hands;Modified independent Where Assessed - Grooming: Unsupported standing Upper Body Dressing: Performed;Modified independent Where Assessed - Upper Body Dressing: Unsupported sitting Lower Body Dressing: Simulated;Supervision/safety Where Assessed - Lower Body Dressing: Unsupported sit to stand Toilet Transfer: Supervision/safety;Performed Toilet Transfer Method: Sit to Barista: Raised toilet seat with arms (or 3-in-1 over toilet) Toileting - Clothing Manipulation and Hygiene: Modified independent;Performed Where Assessed - Engineer, mining and Hygiene: Standing Tub/Shower Transfer: Engineer, manufacturing Method: Science writer: Counsellor Used: Back brace;Rolling walker Transfers/Ambulation Related to ADLs: supervision with RW ADL Comments: Pt demonstrated use of reacher for LB dressing tasks.  Pt verbalized understanding for proper use of sock aid  and toilet aid.  Pt reports she feels that her self care needs have been met and has no further questions.  Pt donned back brace with mod I.  Safely returned demonstration of tub transfer with bench at supervision level    OT Diagnosis:    OT Problem List:   OT Treatment Interventions:     OT Goals Acute Rehab OT Goals OT Goal Formulation: With patient Time For Goal Achievement: 11/19/12 Potential to Achieve Goals: Good ADL Goals Pt Will Perform Lower Body Bathing: with supervision;with set-up;with caregiver independent in assisting;Sit to stand from bed;Unsupported;with adaptive equipment ADL Goal: Lower Body Bathing - Progress: Met Pt Will Perform Lower Body Dressing: with set-up;with supervision;with caregiver independent in assisting;Sit to stand from bed;Unsupported;with adaptive equipment ADL Goal: Lower Body Dressing - Progress: Met Pt Will Transfer to Toilet: with modified independence;with DME;Ambulation;3-in-1;Maintaining back safety precautions ADL Goal: Toilet Transfer - Progress: Partly met (at sup level; appropriate for d/c home) Pt Will Perform Toileting - Clothing Manipulation: with modified independence;Standing;Sitting on 3-in-1 or toilet;with adaptive equipment ADL Goal: Toileting - Clothing Manipulation - Progress: Met Pt Will Perform Toileting - Hygiene: with modified independence;Sit to stand from 3-in-1/toilet;with adaptive equipment ADL Goal: Toileting - Hygiene - Progress: Met Pt Will Perform Tub/Shower Transfer: with supervision;with caregiver independent in assisting;Ambulation;with DME;Transfer tub bench;Maintaining back safety precautions ADL Goal: Tub/Shower Transfer - Progress: Met Additional ADL Goal #1: Pt will independently demonstrate back precautions during ADL and mobility ADL Goal: Additional Goal #1 - Progress: Met  Visit Information  Last OT Received On: 11/15/12 Assistance Needed: +1    Subjective Data      Prior Functioning        Cognition  Cognition Overall Cognitive Status: Appears within functional limits for tasks assessed/performed Arousal/Alertness: Awake/alert Orientation Level: Appears intact for tasks assessed Behavior During Session: [facility] for tasks performed    Mobility  Bed Mobility Bed Mobility:  Rolling Left;Left Sidelying to Sit;Sitting - Scoot to Edge of Bed Rolling Left: 6: Modified independent (Device/Increase time);With rail Left Sidelying to Sit: 6: Modified independent (Device/Increase time);With rails;HOB elevated Sitting - Scoot to Edge of Bed: 6: Modified independent (Device/Increase time) Transfers Transfers: Sit to Stand;Stand to Sit Sit to Stand: 5: Supervision;With upper extremity assist;With armrests;From bed (from tub bench) Stand to Sit: 5: Supervision;With upper extremity assist;With armrests;To chair/3-in-1 (to tub bench) Details for Transfer Assistance: supervision for safety    Exercises      Balance     End of Session OT - End of Session Equipment Utilized During Treatment: Gait belt;Back brace Activity Tolerance: Patient tolerated treatment well Patient left: in chair;with call bell/phone within reach Nurse Communication: Mobility status  GO    11/15/2012 Cipriano Mile OTR/L Pager [phone] Office [phone]  Cipriano Mile 11/15/2012, 12:28 PM

## 2013-06-07 ENCOUNTER — Encounter: Payer: Self-pay | Admitting: Neurosurgery

## 2013-06-08 ENCOUNTER — Ambulatory Visit: Payer: Self-pay | Admitting: Neurosurgery

## 2013-12-22 ENCOUNTER — Emergency Department: Payer: Self-pay | Admitting: Internal Medicine

## 2013-12-29 ENCOUNTER — Emergency Department: Payer: Self-pay | Admitting: Emergency Medicine

## 2014-03-20 ENCOUNTER — Ambulatory Visit: Payer: Self-pay | Admitting: Neurosurgery

## 2014-07-28 ENCOUNTER — Emergency Department: Payer: Self-pay | Admitting: Student

## 2014-07-28 LAB — COMPREHENSIVE METABOLIC PANEL
ALBUMIN: 3.1 g/dL — AB (ref 3.4–5.0)
ALK PHOS: 73 U/L
ALT: 14 U/L
Anion Gap: 6 — ABNORMAL LOW (ref 7–16)
BILIRUBIN TOTAL: 0.4 mg/dL (ref 0.2–1.0)
BUN: 8 mg/dL (ref 7–18)
CALCIUM: 8.4 mg/dL — AB (ref 8.5–10.1)
CREATININE: 0.69 mg/dL (ref 0.60–1.30)
Chloride: 110 mmol/L — ABNORMAL HIGH (ref 98–107)
Co2: 27 mmol/L (ref 21–32)
EGFR (African American): >60
EGFR (Non-African Amer.): >60
Glucose: 77 mg/dL (ref 65–99)
OSMOLALITY: 282 (ref 275–301)
Potassium: 3.9 mmol/L (ref 3.5–5.1)
SGOT(AST): 17 U/L (ref 15–37)
SODIUM: 143 mmol/L (ref 136–145)
Total Protein: 7.3 g/dL (ref 6.4–8.2)

## 2014-07-28 LAB — CBC
HCT: 38.5 % (ref 35.0–47.0)
HGB: 12.2 g/dL (ref 12.0–16.0)
MCH: 27.4 pg (ref 26.0–34.0)
MCHC: 31.7 g/dL — ABNORMAL LOW (ref 32.0–36.0)
MCV: 87 fL (ref 80–100)
PLATELETS: 266 10*3/uL (ref 150–440)
RBC: 4.46 10*6/uL (ref 3.80–5.20)
RDW: 15.4 % — AB (ref 11.5–14.5)
WBC: 5.6 10*3/uL (ref 3.6–11.0)

## 2014-07-28 LAB — TROPONIN I: Troponin-I: <0.02 ng/mL

## 2014-08-11 ENCOUNTER — Emergency Department: Payer: Self-pay | Admitting: Internal Medicine

## 2014-10-31 ENCOUNTER — Emergency Department: Payer: Self-pay | Admitting: Emergency Medicine

## 2014-10-31 LAB — TROPONIN I: Troponin-I: <0.02 ng/mL

## 2014-10-31 LAB — CBC WITH DIFFERENTIAL/PLATELET
Basophil #: 0 10*3/uL (ref 0.0–0.1)
Basophil %: 1 %
Eosinophil #: 0.1 10*3/uL (ref 0.0–0.7)
Eosinophil %: 2.2 %
HCT: 40.5 % (ref 35.0–47.0)
HGB: 12.9 g/dL (ref 12.0–16.0)
Lymphocyte #: 2.3 10*3/uL (ref 1.0–3.6)
Lymphocyte %: 51 %
MCH: 27 pg (ref 26.0–34.0)
MCHC: 31.9 g/dL — ABNORMAL LOW (ref 32.0–36.0)
MCV: 85 fL (ref 80–100)
Monocyte #: 0.4 x10 3/mm (ref 0.2–0.9)
Monocyte %: 8.8 %
Neutrophil #: 1.7 10*3/uL (ref 1.4–6.5)
Neutrophil %: 37 %
Platelet: 249 10*3/uL (ref 150–440)
RBC: 4.78 10*6/uL (ref 3.80–5.20)
RDW: 15.2 % — ABNORMAL HIGH (ref 11.5–14.5)
WBC: 4.5 10*3/uL (ref 3.6–11.0)

## 2014-10-31 LAB — URINALYSIS, COMPLETE
BACTERIA: NONE SEEN
BILIRUBIN, UR: NEGATIVE
Blood: NEGATIVE
GLUCOSE, UR: NEGATIVE mg/dL (ref 0–75)
Ketone: NEGATIVE
NITRITE: NEGATIVE
Ph: 6 (ref 4.5–8.0)
Protein: NEGATIVE
RBC,UR: 2 /HPF (ref 0–5)
SPECIFIC GRAVITY: 1.013 (ref 1.003–1.030)

## 2014-10-31 LAB — COMPREHENSIVE METABOLIC PANEL
ALT: 16 U/L (ref 14–63)
ANION GAP: 8 (ref 7–16)
AST: 17 U/L (ref 15–37)
Albumin: 3.4 g/dL (ref 3.4–5.0)
Alkaline Phosphatase: 85 U/L (ref 46–116)
BUN: 8 mg/dL (ref 7–18)
Bilirubin,Total: 0.5 mg/dL (ref 0.2–1.0)
CHLORIDE: 104 mmol/L (ref 98–107)
CO2: 26 mmol/L (ref 21–32)
Calcium, Total: 9.1 mg/dL (ref 8.5–10.1)
Creatinine: 0.69 mg/dL (ref 0.60–1.30)
Glucose: 93 mg/dL (ref 65–99)
Osmolality: 274 (ref 275–301)
Potassium: 3.8 mmol/L (ref 3.5–5.1)
Sodium: 138 mmol/L (ref 136–145)
Total Protein: 7.9 g/dL (ref 6.4–8.2)

## 2014-12-09 ENCOUNTER — Emergency Department: Payer: Self-pay | Admitting: Internal Medicine

## 2014-12-09 DIAGNOSIS — Z880 Allergy status to penicillin: Secondary | ICD-10-CM | POA: Diagnosis not present

## 2014-12-09 DIAGNOSIS — M545 Low back pain: Secondary | ICD-10-CM | POA: Diagnosis not present

## 2014-12-09 DIAGNOSIS — G8929 Other chronic pain: Secondary | ICD-10-CM | POA: Diagnosis not present

## 2014-12-10 DIAGNOSIS — M48061 Spinal stenosis, lumbar region without neurogenic claudication: Secondary | ICD-10-CM | POA: Insufficient documentation

## 2014-12-13 DIAGNOSIS — M5416 Radiculopathy, lumbar region: Secondary | ICD-10-CM | POA: Diagnosis not present

## 2014-12-13 DIAGNOSIS — M4806 Spinal stenosis, lumbar region: Secondary | ICD-10-CM | POA: Diagnosis not present

## 2014-12-18 ENCOUNTER — Other Ambulatory Visit: Payer: Self-pay | Admitting: Neurosurgery

## 2014-12-18 DIAGNOSIS — M5416 Radiculopathy, lumbar region: Secondary | ICD-10-CM

## 2014-12-28 DIAGNOSIS — R600 Localized edema: Secondary | ICD-10-CM | POA: Diagnosis not present

## 2014-12-28 DIAGNOSIS — M5126 Other intervertebral disc displacement, lumbar region: Secondary | ICD-10-CM | POA: Diagnosis not present

## 2014-12-28 DIAGNOSIS — Z87898 Personal history of other specified conditions: Secondary | ICD-10-CM | POA: Diagnosis not present

## 2014-12-28 DIAGNOSIS — Z1239 Encounter for other screening for malignant neoplasm of breast: Secondary | ICD-10-CM | POA: Diagnosis not present

## 2015-01-06 ENCOUNTER — Ambulatory Visit
Admission: RE | Admit: 2015-01-06 | Discharge: 2015-01-06 | Disposition: A | Discharge status: Home / Self Care | Payer: Commercial Managed Care - HMO | Source: Ambulatory Visit | Attending: Neurosurgery | Admitting: Neurosurgery

## 2015-01-06 DIAGNOSIS — M4327 Fusion of spine, lumbosacral region: Secondary | ICD-10-CM | POA: Diagnosis not present

## 2015-01-06 DIAGNOSIS — M5136 Other intervertebral disc degeneration, lumbar region: Secondary | ICD-10-CM | POA: Diagnosis not present

## 2015-01-06 DIAGNOSIS — M5416 Radiculopathy, lumbar region: Secondary | ICD-10-CM

## 2015-01-06 MED ORDER — GADOBENATE DIMEGLUMINE 529 MG/ML IV SOLN
20.0000 mL | Freq: Once | INTRAVENOUS | Status: AC | PRN
  Administered 2015-01-06: 20 mL via INTRAVENOUS

## 2015-01-16 DIAGNOSIS — M4300 Spondylolysis, site unspecified: Secondary | ICD-10-CM | POA: Diagnosis not present

## 2015-02-12 DIAGNOSIS — M47816 Spondylosis without myelopathy or radiculopathy, lumbar region: Secondary | ICD-10-CM | POA: Diagnosis not present

## 2015-07-01 ENCOUNTER — Encounter: Payer: Self-pay | Admitting: Internal Medicine

## 2015-07-01 ENCOUNTER — Other Ambulatory Visit: Payer: Self-pay | Admitting: Internal Medicine

## 2015-07-01 DIAGNOSIS — R7303 Prediabetes: Secondary | ICD-10-CM | POA: Insufficient documentation

## 2015-07-01 DIAGNOSIS — R739 Hyperglycemia, unspecified: Secondary | ICD-10-CM

## 2015-07-04 ENCOUNTER — Ambulatory Visit (INDEPENDENT_AMBULATORY_CARE_PROVIDER_SITE_OTHER): Payer: Commercial Managed Care - HMO | Admitting: Internal Medicine

## 2015-07-04 ENCOUNTER — Encounter: Payer: Self-pay | Admitting: Internal Medicine

## 2015-07-04 VITALS — BP 100/64 | HR 64 | Ht 67.5 in | Wt 318.4 lb

## 2015-07-04 DIAGNOSIS — Z9884 Bariatric surgery status: Secondary | ICD-10-CM

## 2015-07-04 DIAGNOSIS — M48062 Spinal stenosis, lumbar region with neurogenic claudication: Secondary | ICD-10-CM

## 2015-07-04 DIAGNOSIS — G43909 Migraine, unspecified, not intractable, without status migrainosus: Secondary | ICD-10-CM | POA: Diagnosis not present

## 2015-07-04 DIAGNOSIS — Z0000 Encounter for general adult medical examination without abnormal findings: Secondary | ICD-10-CM

## 2015-07-04 DIAGNOSIS — E785 Hyperlipidemia, unspecified: Secondary | ICD-10-CM | POA: Diagnosis not present

## 2015-07-04 DIAGNOSIS — R739 Hyperglycemia, unspecified: Secondary | ICD-10-CM

## 2015-07-04 DIAGNOSIS — M4806 Spinal stenosis, lumbar region: Secondary | ICD-10-CM | POA: Diagnosis not present

## 2015-07-04 DIAGNOSIS — Z1239 Encounter for other screening for malignant neoplasm of breast: Secondary | ICD-10-CM | POA: Diagnosis not present

## 2015-07-04 LAB — POCT URINALYSIS DIPSTICK
Bilirubin, UA: NEGATIVE
Blood, UA: NEGATIVE
Glucose, UA: NEGATIVE
Ketones, UA: NEGATIVE
Leukocytes, UA: NEGATIVE
NITRITE UA: NEGATIVE
Protein, UA: NEGATIVE
Spec Grav, UA: <=1.005
UROBILINOGEN UA: 0.2
pH, UA: 5

## 2015-07-04 MED ORDER — RIZATRIPTAN BENZOATE 10 MG PO TBDP: 10.0000 mg | ORAL_TABLET | ORAL | Status: DC | PRN

## 2015-07-04 NOTE — Patient Instructions (Signed)
Health Maintenance  Topic Date Due  . HIV Screening  03/25/1983  . INFLUENZA VACCINE  04/29/2015  . PAP SMEAR  04/24/2017  . TETANUS/TDAP  04/24/2024   Breast Self-Awareness Practicing breast self-awareness may pick up problems early, prevent significant medical complications, and possibly save your life. By practicing breast self-awareness, you can become familiar with how your breasts look and feel and if your breasts are changing. This allows you to notice changes early. It can also offer you some reassurance that your breast health is good. One way to learn what is normal for your breasts and whether your breasts are changing is to do a breast self-exam. If you find a lump or something that was not present in the past, it is best to contact your caregiver right away. Other findings that should be evaluated by your caregiver include nipple discharge, especially if it is bloody; skin changes or reddening; areas where the skin seems to be pulled in (retracted); or new lumps and bumps. Breast pain is seldom associated with cancer (malignancy), but should also be evaluated by a caregiver. HOW TO PERFORM A BREAST SELF-EXAM The best time to examine your breasts is 5-7 days after your menstrual period is over. During menstruation, the breasts are lumpier, and it may be more difficult to pick up changes. If you do not menstruate, have reached menopause, or had your uterus removed (hysterectomy), you should examine your breasts at regular intervals, such as monthly. If you are breastfeeding, examine your breasts after a feeding or after using a breast pump. Breast implants do not decrease the risk for lumps or tumors, so continue to perform breast self-exams as recommended. Talk to your caregiver about how to determine the difference between the implant and breast tissue. Also, talk about the amount of pressure you should use during the exam. Over time, you will become more familiar with the variations of your  breasts and more comfortable with the exam. A breast self-exam requires you to remove all your clothes above the waist. 1. Look at your breasts and nipples. Stand in front of a mirror in a room with good lighting. With your hands on your hips, push your hands firmly downward. Look for a difference in shape, contour, and size from one breast to the other (asymmetry). Asymmetry includes puckers, dips, or bumps. Also, look for skin changes, such as reddened or scaly areas on the breasts. Look for nipple changes, such as discharge, dimpling, repositioning, or redness. 2. Carefully feel your breasts. This is best done either in the shower or tub while using soapy water or when flat on your back. Place the arm (on the side of the breast you are examining) above your head. Use the pads (not the fingertips) of your three middle fingers on your opposite hand to feel your breasts. Start in the underarm area and use  inch (2 cm) overlapping circles to feel your breast. Use 3 different levels of pressure (light, medium, and firm pressure) at each circle before moving to the next circle. The light pressure is needed to feel the tissue closest to the skin. The medium pressure will help to feel breast tissue a little deeper, while the firm pressure is needed to feel the tissue close to the ribs. Continue the overlapping circles, moving downward over the breast until you feel your ribs below your breast. Then, move one finger-width towards the center of the body. Continue to use the  inch (2 cm) overlapping circles to feel your  breast as you move slowly up toward the collar bone (clavicle) near the base of the neck. Continue the up and down exam using all 3 pressures until you reach the middle of the chest. Do this with each breast, carefully feeling for lumps or changes. 3.  Keep a written record with breast changes or normal findings for each breast. By writing this information down, you do not need to depend only on memory  for size, tenderness, or location. Write down where you are in your menstrual cycle, if you are still menstruating. Breast tissue can have some lumps or thick tissue. However, see your caregiver if you find anything that concerns you.  SEEK MEDICAL CARE IF:  You see a change in shape, contour, or size of your breasts or nipples.   You see skin changes, such as reddened or scaly areas on the breasts or nipples.   You have an unusual discharge from your nipples.   You feel a new lump or unusually thick areas.    This information is not intended to replace advice given to you by your health care provider. Make sure you discuss any questions you have with your health care provider.   Document Released: 09/14/2005 Document Revised: 08/31/2012 Document Reviewed: 12/30/2011 Elsevier Interactive Patient Education [employer].

## 2015-07-04 NOTE — Progress Notes (Signed)
Patient: Kimberly Cowan, Female    DOB: 03/30/1966, 49 y.o.   MRN: [id number] Visit Date: 07/04/2015  Today's Provider: Halina Maidens, MD   Chief Complaint  Patient presents with  . Medicare Wellness   Subjective:    Annual wellness visit Kimberly Cowan is a 49 y.o. female who presents today for her Subsequent Annual Wellness Visit. She feels fairly well. She reports exercising none due to chronic back pain. She reports she is sleeping fairly well. Mammogram has not been done in a number of years. Pap smear was done last year and was normal. She has not had a colonoscopy but there is no family history of colon cancer.  ----------------------------------------------------------- HPI  Migraine - Patient has a history of migraines for a number of years. Previously treated with Maxalt but is out of that prescription. Migraines occur about every couple of months. The last 2 migraines required a visit to the emergency room.  Lumbar back pain - chronic low back pain due to spinal stenosis. Patient is off of pain medications at this time. She needs another referral back to Dr. Earnie Larsson at [facility] neurosurgery and spine. She walks with a walker. She has safety bars in her shower and tub. She has frequent falls due to back and leg pain. Most recent falls did not result in injury.  Hyperglycemia -Patient has a history of borderline hyperglycemia. She's been working some with her diet and has lost a few pounds. Her last A1c was 6.4. She denies polyuria polydipsia, urinary symptoms, vision change or fatigue.  Review of Systems  Constitutional: Negative for fever, fatigue and unexpected weight change.  HENT: Negative for hearing loss, sinus pressure and trouble swallowing.   Eyes: Negative for visual disturbance.  Respiratory: Negative for cough, chest tightness, shortness of breath and wheezing.   Cardiovascular: Positive for leg swelling. Negative for chest pain and  palpitations.  Gastrointestinal: Negative for abdominal pain, diarrhea and constipation.  Endocrine: Negative for polydipsia and polyuria.  Genitourinary: Negative for dysuria, frequency, hematuria, vaginal bleeding and vaginal discharge.  Musculoskeletal: Positive for myalgias, back pain and gait problem.  Skin: Negative for color change and rash.  Neurological: Positive for headaches. Negative for seizures, syncope and numbness.  Hematological: Negative for adenopathy. Does not bruise/bleed easily.  Psychiatric/Behavioral: Negative for confusion, sleep disturbance and dysphoric mood. The patient is not nervous/anxious.     Social History   Social History  . Marital Status: Single    Spouse Name: N/A  . Number of Children: N/A  . Years of Education: N/A   Occupational History  . Not on file.   Social History Main Topics  . Smoking status: Never Smoker   . Smokeless tobacco: Not on file  . Alcohol Use: No  . Drug Use: No  . Sexual Activity: Not on file   Other Topics Concern  . Not on file   Social History Narrative    Patient Active Problem List   Diagnosis Date Noted  . Hyperglycemia 07/01/2015  . Spinal stenosis, lumbar region, with neurogenic claudication 11/11/2012  . Clinical depression 04/04/2012  . Headache, migraine 04/04/2012    Past Surgical History  Procedure Laterality Date  . C sections    . Tonsillectomy    . Gastric bypass  2005  . Abdominal hysterectomy  10/2006    cervix and ovaries remain  . Back surgery  2014    lumbar fusion  . Breast reduction surgery      Her  family history includes Diabetes in her mother and sister; Hypertension in her father, mother, and sister.    Previous Medications   No medications on file    Patient Care Team: Earnie Larsson, MD as Consulting Physician (Neurosurgery)     Objective:   Vitals: BP 100/64 mmHg  Pulse 64  Ht 5' 7.5" (1.715 m)  Wt 318 lb 6.4 oz (144.425 kg)  BMI 49.10 kg/m2  Physical Exam   Constitutional: She is oriented to person, place, and time. She appears well-developed and well-nourished. No distress.  HENT:  Head: Normocephalic and atraumatic.  Right Ear: Tympanic membrane and ear canal normal.  Left Ear: Tympanic membrane and ear canal normal.  Nose: Right sinus exhibits no maxillary sinus tenderness. Left sinus exhibits no maxillary sinus tenderness.  Mouth/Throat: Uvula is midline and oropharynx is clear and moist.  Eyes: Conjunctivae and EOM are normal. Right eye exhibits no discharge. Left eye exhibits no discharge. No scleral icterus.  Neck: Normal range of motion. Carotid bruit is not present. No erythema present. No thyromegaly present.  Cardiovascular: Normal rate, regular rhythm, normal heart sounds and normal pulses.   Pulmonary/Chest: Effort normal and breath sounds normal. No respiratory distress. She has no wheezes. Right breast exhibits no mass, no nipple discharge and no tenderness. Left breast exhibits no mass, no nipple discharge and no tenderness.  Bilateral breast reduction surgical scars are well-healed  Abdominal: Soft. Bowel sounds are normal. There is no hepatosplenomegaly. There is no tenderness. There is no CVA tenderness.  Musculoskeletal: Normal range of motion.  Lymphadenopathy:    She has no cervical adenopathy.    She has no axillary adenopathy.  Neurological: She is alert and oriented to person, place, and time. She has normal strength. No cranial nerve deficit or sensory deficit. Gait (unsteady requiring a walker for balance) abnormal.  Skin: Skin is warm, dry and intact. No rash noted.  Psychiatric: She has a normal mood and affect. Her speech is normal and behavior is normal. Thought content normal. Cognition and memory are normal.  Nursing note and vitals reviewed.   Activities of Daily Living In your present state of health, do you have any difficulty performing the following activities: 07/04/2015  Hearing? N  Vision? N   Difficulty concentrating or making decisions? N  Walking or climbing stairs? Y  Dressing or bathing? N  Doing errands, shopping? Y    Fall Risk Assessment Fall Risk  07/04/2015  Falls in the past year? Yes  Number falls in past yr: 2 or more  Injury with Fall? No  Risk Factor Category  High Fall Risk     Patient reports there are safety devices in place in shower at home.   Depression Screen PHQ 2/9 Scores 07/04/2015  PHQ - 2 Score 0    Assessment & Plan:     Annual Wellness Visit  Reviewed patient's Family Medical History Reviewed and updated list of patient's medical providers Assessment of cognitive impairment was done Assessed patient's functional ability Established a written schedule for health screening [place] Completed and Reviewed  Exercise Activities and Dietary recommendations Goals    None      Immunization History  Administered Date(s) Administered  . Tdap 04/24/2014    Health Maintenance  Topic Date Due  . HIV Screening  03/25/1983  . INFLUENZA VACCINE  04/29/2015  . PAP SMEAR  04/24/2017  . TETANUS/TDAP  04/24/2024     ------------------------------------------------------------------------------------------------------------  1. Medicare annual wellness visit, subsequent Medicare  wellness measures satisfied other than 6 CIT - POCT urinalysis dipstick 2. Hyperglycemia Will check A1c: Continue working on low carb diet and weight loss - Comprehensive metabolic panel - Hemoglobin A1c  3. Spinal stenosis, lumbar region, with neurogenic claudication We will authorize visits with neurosurgery and spine Dr. Earnie Larsson  4. Migraine without status migrainosus, not intractable, unspecified migraine type Prescription for Maxalt is given to avoid visits to the emergency room - rizatriptan (MAXALT-MLT) 10 MG disintegrating tablet; Take 1 tablet (10 mg total) by mouth as needed for migraine. May repeat in 2 hours if needed   Dispense: 10 tablet; Refill: 0 - CBC with Differential/Platelet - TSH  5. Breast cancer screening Begin annual mammograms - MM DIGITAL SCREENING BILATERAL; Future  6. Hyperlipidemia, mild Will check lipid panel and advise - Lipid panel   Halina Maidens, MD [facility]  07/04/2015

## 2015-07-05 LAB — LIPID PANEL
Chol/HDL Ratio: 2.2 ratio (ref 0.0–4.4)
Cholesterol, Total: 127 mg/dL (ref 100–199)
HDL: 57 mg/dL
LDL Calculated: 61 mg/dL (ref 0–99)
Triglycerides: 43 mg/dL (ref 0–149)
VLDL Cholesterol Cal: 9 mg/dL (ref 5–40)

## 2015-07-05 LAB — CBC WITH DIFFERENTIAL/PLATELET
Basophils Absolute: 0 10*3/uL (ref 0.0–0.2)
Basos: 1 %
EOS (ABSOLUTE): 0.1 10*3/uL (ref 0.0–0.4)
Eos: 3 %
HEMOGLOBIN: 11.2 g/dL (ref 11.1–15.9)
Hematocrit: 35.8 % (ref 34.0–46.6)
Immature Grans (Abs): 0 10*3/uL (ref 0.0–0.1)
Immature Granulocytes: 0 %
LYMPHS ABS: 2.2 10*3/uL (ref 0.7–3.1)
Lymphs: 50 %
MCH: 26 pg — ABNORMAL LOW (ref 26.6–33.0)
MCHC: 31.3 g/dL — AB (ref 31.5–35.7)
MCV: 83 fL (ref 79–97)
MONOCYTES: 8 %
Monocytes Absolute: 0.4 10*3/uL (ref 0.1–0.9)
NEUTROS ABS: 1.7 10*3/uL (ref 1.4–7.0)
Neutrophils: 38 %
PLATELETS: 288 10*3/uL (ref 150–379)
RBC: 4.3 x10E6/uL (ref 3.77–5.28)
RDW: 14.6 % (ref 12.3–15.4)
WBC: 4.4 10*3/uL (ref 3.4–10.8)

## 2015-07-05 LAB — TSH: TSH: 0.812 u[IU]/mL (ref 0.450–4.500)

## 2015-07-05 LAB — COMPREHENSIVE METABOLIC PANEL
CHLORIDE: 107 mmol/L (ref 97–108)
POTASSIUM: 3.9 mmol/L (ref 3.5–5.2)
SODIUM: 140 mmol/L (ref 134–144)

## 2015-07-05 LAB — HEMOGLOBIN A1C
Est. average glucose Bld gHb Est-mCnc: 131 mg/dL
Hgb A1c MFr Bld: 6.2 % — ABNORMAL HIGH (ref 4.8–5.6)

## 2015-07-10 ENCOUNTER — Ambulatory Visit: Admission: RE | Admit: 2015-07-10 | Payer: Commercial Managed Care - HMO | Source: Ambulatory Visit

## 2015-07-18 DIAGNOSIS — M47816 Spondylosis without myelopathy or radiculopathy, lumbar region: Secondary | ICD-10-CM | POA: Diagnosis not present

## 2015-07-18 DIAGNOSIS — Z6841 Body Mass Index (BMI) 40.0 and over, adult: Secondary | ICD-10-CM | POA: Diagnosis not present

## 2015-07-19 ENCOUNTER — Other Ambulatory Visit: Payer: Self-pay | Admitting: Internal Medicine

## 2015-07-19 ENCOUNTER — Telehealth: Payer: Self-pay

## 2015-07-19 DIAGNOSIS — M48062 Spinal stenosis, lumbar region with neurogenic claudication: Secondary | ICD-10-CM

## 2015-07-19 NOTE — Telephone Encounter (Signed)
Kimberly Cowan says Dr. Trenton Gammon is no longer writing her pain medication and she needs a referral to pain clinic.dr

## 2015-07-26 ENCOUNTER — Other Ambulatory Visit: Payer: Self-pay | Admitting: Internal Medicine

## 2015-07-26 ENCOUNTER — Ambulatory Visit
Admission: RE | Admit: 2015-07-26 | Discharge: 2015-07-26 | Disposition: A | Discharge status: Home / Self Care | Payer: Commercial Managed Care - HMO | Source: Ambulatory Visit | Attending: Internal Medicine | Admitting: Internal Medicine

## 2015-07-26 ENCOUNTER — Ambulatory Visit: Payer: Commercial Managed Care - HMO | Admitting: Internal Medicine

## 2015-07-26 DIAGNOSIS — Z1231 Encounter for screening mammogram for malignant neoplasm of breast: Secondary | ICD-10-CM | POA: Insufficient documentation

## 2015-07-26 DIAGNOSIS — Z1239 Encounter for other screening for malignant neoplasm of breast: Secondary | ICD-10-CM

## 2015-07-31 ENCOUNTER — Other Ambulatory Visit: Payer: Self-pay | Admitting: Neurosurgery

## 2015-07-31 DIAGNOSIS — M47816 Spondylosis without myelopathy or radiculopathy, lumbar region: Secondary | ICD-10-CM

## 2015-08-09 ENCOUNTER — Other Ambulatory Visit: Payer: Self-pay | Admitting: Neurosurgery

## 2015-08-12 ENCOUNTER — Ambulatory Visit
Admission: RE | Admit: 2015-08-12 | Discharge: 2015-08-12 | Disposition: A | Discharge status: Home / Self Care | Payer: Commercial Managed Care - HMO | Source: Ambulatory Visit | Attending: Neurosurgery | Admitting: Neurosurgery

## 2015-08-12 VITALS — BP 117/56 | HR 73

## 2015-08-12 DIAGNOSIS — M47816 Spondylosis without myelopathy or radiculopathy, lumbar region: Secondary | ICD-10-CM

## 2015-08-12 DIAGNOSIS — M5136 Other intervertebral disc degeneration, lumbar region: Secondary | ICD-10-CM | POA: Diagnosis not present

## 2015-08-12 DIAGNOSIS — M48062 Spinal stenosis, lumbar region with neurogenic claudication: Secondary | ICD-10-CM

## 2015-08-12 DIAGNOSIS — M4326 Fusion of spine, lumbar region: Secondary | ICD-10-CM | POA: Diagnosis not present

## 2015-08-12 MED ORDER — DIAZEPAM 5 MG PO TABS
10.0000 mg | ORAL_TABLET | Freq: Once | ORAL | Status: AC
  Administered 2015-08-12: 10 mg via ORAL

## 2015-08-12 MED ORDER — IOHEXOL 180 MG/ML  SOLN
15.0000 mL | Freq: Once | INTRAMUSCULAR | Status: DC | PRN
  Administered 2015-08-12: 15 mL via INTRATHECAL

## 2015-08-12 MED ORDER — ONDANSETRON HCL 4 MG/2ML IJ SOLN
4.0000 mg | Freq: Once | INTRAMUSCULAR | Status: AC
  Administered 2015-08-12: 4 mg via INTRAMUSCULAR

## 2015-08-12 MED ORDER — MEPERIDINE HCL 100 MG/ML IJ SOLN
100.0000 mg | Freq: Once | INTRAMUSCULAR | Status: AC
  Administered 2015-08-12: 100 mg via INTRAMUSCULAR

## 2015-08-12 NOTE — Discharge Instructions (Signed)
Myelogram Discharge Instructions  1. Go home and rest quietly for the next 24 hours.  It is important to lie flat for the next 24 hours.  Get up only to go to the restroom.  You may lie in the bed or on a couch on your back, your stomach, your left side or your right side.  You may have one pillow under your head.  You may have pillows between your knees while you are on your side or under your knees while you are on your back.  2. DO NOT drive today.  Recline the seat as far back as it will go, while still wearing your seat belt, on the way home.  3. You may get up to go to the bathroom as needed.  You may sit up for 10 minutes to eat.  You may resume your normal diet and medications unless otherwise indicated.  Drink lots of extra fluids today and tomorrow.  4. The incidence of headache, nausea, or vomiting is about 5% (one in 20 patients).  If you develop a headache, lie flat and drink plenty of fluids until the headache goes away.  Caffeinated beverages may be helpful.  If you develop severe nausea and vomiting or a headache that does not go away with flat bed rest, call [phone].  5. You may resume normal activities after your 24 hours of bed rest is over; however, do not exert yourself strongly or do any heavy lifting tomorrow. If when you get up you have a headache when standing, go back to bed and force fluids for another 24 hours.  6. Call your physician for a follow-up appointment.  The results of your myelogram will be sent directly to your physician by the following day.  7. If you have any questions or if complications develop after you arrive home, please call [phone].  Discharge instructions have been explained to the patient.  The patient, or the person responsible for the patient, fully understands these instructions.      May resume Maxalt on Nov. 15, 2016, after 1:00 pm.

## 2015-08-12 NOTE — Progress Notes (Signed)
Patient states she has been off Maxalt for at least the past two days. 

## 2015-08-14 ENCOUNTER — Ambulatory Visit: Payer: Commercial Managed Care - HMO | Attending: Anesthesiology | Admitting: Anesthesiology

## 2015-08-14 ENCOUNTER — Other Ambulatory Visit: Payer: Self-pay | Admitting: Anesthesiology

## 2015-08-14 ENCOUNTER — Encounter: Payer: Self-pay | Admitting: Anesthesiology

## 2015-08-14 VITALS — BP 141/73 | HR 73 | Temp 98.6°F | Resp 16 | Ht 67.0 in | Wt 326.0 lb

## 2015-08-14 DIAGNOSIS — M5116 Intervertebral disc disorders with radiculopathy, lumbar region: Secondary | ICD-10-CM | POA: Insufficient documentation

## 2015-08-14 DIAGNOSIS — M545 Low back pain, unspecified: Secondary | ICD-10-CM

## 2015-08-14 DIAGNOSIS — M48061 Spinal stenosis, lumbar region without neurogenic claudication: Secondary | ICD-10-CM | POA: Insufficient documentation

## 2015-08-14 DIAGNOSIS — Z9889 Other specified postprocedural states: Secondary | ICD-10-CM | POA: Diagnosis not present

## 2015-08-14 DIAGNOSIS — M5416 Radiculopathy, lumbar region: Secondary | ICD-10-CM | POA: Insufficient documentation

## 2015-08-14 DIAGNOSIS — M5137 Other intervertebral disc degeneration, lumbosacral region: Secondary | ICD-10-CM | POA: Insufficient documentation

## 2015-08-14 DIAGNOSIS — G8929 Other chronic pain: Secondary | ICD-10-CM | POA: Insufficient documentation

## 2015-08-14 DIAGNOSIS — Z79899 Other long term (current) drug therapy: Secondary | ICD-10-CM | POA: Diagnosis not present

## 2015-08-14 DIAGNOSIS — G43909 Migraine, unspecified, not intractable, without status migrainosus: Secondary | ICD-10-CM | POA: Insufficient documentation

## 2015-08-14 DIAGNOSIS — F1120 Opioid dependence, uncomplicated: Secondary | ICD-10-CM | POA: Diagnosis not present

## 2015-08-14 DIAGNOSIS — Z79891 Long term (current) use of opiate analgesic: Secondary | ICD-10-CM | POA: Diagnosis not present

## 2015-08-14 DIAGNOSIS — M4806 Spinal stenosis, lumbar region: Secondary | ICD-10-CM | POA: Insufficient documentation

## 2015-08-14 DIAGNOSIS — Z9884 Bariatric surgery status: Secondary | ICD-10-CM | POA: Diagnosis not present

## 2015-08-14 DIAGNOSIS — G894 Chronic pain syndrome: Secondary | ICD-10-CM | POA: Diagnosis not present

## 2015-08-14 MED ORDER — TAPENTADOL HCL ER 50 MG PO TB12: 50.0000 mg | ORAL_TABLET | Freq: Two times a day (BID) | ORAL | Status: DC

## 2015-08-14 NOTE — Patient Instructions (Signed)
Epidural Steroid Injection Patient Information  Description: The epidural space surrounds the nerves as they exit the spinal cord.  In some patients, the nerves can be compressed and inflamed by a bulging disc or a tight spinal canal (spinal stenosis).  By injecting steroids into the epidural space, we can bring irritated nerves into direct contact with a potentially helpful medication.  These steroids act directly on the irritated nerves and can reduce swelling and inflammation which often leads to decreased pain.  Epidural steroids may be injected anywhere along the spine and from the neck to the low back depending upon the location of your pain.   After numbing the skin with local anesthetic (like Novocaine), a small needle is passed into the epidural space slowly.  You may experience a sensation of pressure while this is being done.  The entire block usually last less than 10 minutes.  Conditions which may be treated by epidural steroids:   Low back and leg pain  Neck and arm pain  Spinal stenosis  Post-laminectomy syndrome  Herpes zoster (shingles) pain  Pain from compression fractures  Preparation for the injection:  1. Do not eat any solid food or dairy products within 6 hours of your appointment.  2. You may drink clear liquids up to 2 hours before appointment.  Clear liquids include water, black coffee, juice or soda.  No milk or cream please. 3. You may take your regular medication, including pain medications, with a sip of water before your appointment  Diabetics should hold regular insulin (if taken separately) and take 1/2 normal NPH dos the morning of the procedure.  Carry some sugar containing items with you to your appointment. 4. A driver must accompany you and be prepared to drive you home after your procedure.  5. Bring all your current medications with your. 6. An IV may be inserted and sedation may be given at the discretion of the physician.   7. A blood pressure  cuff, EKG and other monitors will often be applied during the procedure.  Some patients may need to have extra oxygen administered for a short period. 8. You will be asked to provide medical information, including your allergies, prior to the procedure.  We must know immediately if you are taking blood thinners (like Coumadin/Warfarin)  Or if you are allergic to IV iodine contrast (dye). We must know if you could possible be pregnant.  Possible side-effects:  Bleeding from needle site  Infection (rare, may require surgery)  Nerve injury (rare)  Numbness & tingling (temporary)  Difficulty urinating (rare, temporary)  Spinal headache ( a headache worse with upright posture)  Light -headedness (temporary)  Pain at injection site (several days)  Decreased blood pressure (temporary)  Weakness in arm/leg (temporary)  Pressure sensation in back/neck (temporary)  Call if you experience:  Fever/chills associated with headache or increased back/neck pain.  Headache worsened by an upright position.  New onset weakness or numbness of an extremity below the injection site  Hives or difficulty breathing (go to the emergency room)  Inflammation or drainage at the infection site  Severe back/neck pain  Any new symptoms which are concerning to you  Please note:  Although the local anesthetic injected can often make your back or neck feel good for several hours after the injection, the pain will likely return.  It takes 3-7 days for steroids to work in the epidural space.  You may not notice any pain relief for at least that one week.    If effective, we will often do a series of three injections spaced 3-6 weeks apart to maximally decrease your pain.  After the initial series, we generally will wait several months before considering a repeat injection of the same type.  If you have any questions, please call [phone] [facility] 

## 2015-08-14 NOTE — Progress Notes (Signed)
Subjective:    Patient ID: Kimberly Cowan, female    DOB: [birth year], 49 y.o.   MRN: [id number]  HPI  This patient is pleasant delightful 49 year old lady who comes in with a long history of chronic low back pain She indicates that she has been having back pain since January 2009 Her back pain started spontaneously Is important to note that patient was largely overweight and that she weighed over 500 pounds and she had approximately 170 pounds reduction in weight following gastric bypass surgery This patient indicates that all members of her family have been largely obese and overweight She describes her pain as sharp burning and constant She indicates that the pain starts in the paraspinous areas of L4-L5 bilaterally and goes down both both hips into both legs and ending in both feet and affecting all her toes She indicates that the left side is much worse than the right She indicated that she had back surgery in 2009 and got satisfactory pain relief However in 2013 the back pain recurred and she had a second back surgery which involved spinal fusion and they implantation of various hardware devices Following that surgery she got 2 weeks of pain relief and the pain has recurred unrelentingly until today  Pain intensity rating Her subjective pain intensity rating is 90% Pain is relieved by lying on the side either right or the left side but she cannot lie flat Pain is aggravated by walking and standing and sitting  Pain medications She does not take any pain medication at the present time but she used to take Percocet and Flexeril and stopped taking it in August of this year  Other medications Other medications include Maxalt which she takes for migraine headaches  Allergies Patient is allergic to morphine and penicillin She indicates that she gets hives shortness of breath nausea and vomiting when she takes morphine all related products  She develops almost the same  symptoms when she takes penicillin  Past medical history Past medical history is positive for migraine headaches sleep apnea anxiety neurosis panic attacks and depression which occurred following the death of her late husband from cancer of the lung in 2011  Past surgical history Past surgical history is positive for 2 back surgeries including a spinal fusion, will abdominal hysterectomy for gynecological issues, 2 cesarean sections, tonsillectomy and adenoidectomy, gastric bypass surgery for morbid obesity and bilateral breast reduction  Social and economic history This patient does not smoke and she has never smoked before She does not drink alcohol She does not use illicit drugs  Obstetric history She is para 2+1 and she had 1 miscarriage  Family history Her children at age 12 She is widowed and lives with her mother Mother is alive and well at age 71 Her father is alive and well at age 5 She has no brothers She has 3 sisters ages 31 and 70 and they're all alive and well  Imaging report  She's had CT lumbar myelogram and the results show L5-S1 posterior decompression with solid fusion Mild residual left neural foraminal stenosis Mild disc and facet degeneration at L4-L5 with mild right and minimal left foraminal stenosis    Review of Systems  Constitutional: Positive for activity change. Negative for fever, chills, diaphoresis, appetite change, fatigue and unexpected weight change.       Is patient is largely overweight Her current weight is 325 pounds This represents a significant decrease from her prior weight of over 500 pounds She walks  with a walker  HENT: Negative.  Negative for congestion, dental problem, drooling, ear discharge, ear pain, facial swelling, hearing loss, mouth sores, nosebleeds, postnasal drip, rhinorrhea, sinus pressure, sneezing, sore throat, tinnitus, trouble swallowing and voice change.   Eyes: Negative.  Negative for photophobia, pain,  discharge, redness, itching and visual disturbance.  Respiratory: Negative.  Negative for apnea, cough, choking, chest tightness, shortness of breath, wheezing and stridor.   Cardiovascular: Negative for chest pain, palpitations and leg swelling.  Gastrointestinal: Negative.  Negative for nausea, vomiting, abdominal pain, diarrhea, constipation, blood in stool, abdominal distention and anal bleeding.  Endocrine: Negative.  Negative for cold intolerance, heat intolerance, polydipsia, polyphagia and polyuria.  Genitourinary: Negative.   Musculoskeletal: Negative.  Negative for myalgias, back pain, joint swelling, arthralgias, gait problem, neck pain and neck stiffness.  Skin: Negative.  Negative for color change, pallor, rash and wound.  Allergic/Immunologic: Negative.  Negative for environmental allergies, food allergies and immunocompromised state.  Neurological: Positive for dizziness. Negative for tremors, seizures, syncope, facial asymmetry, speech difficulty, weakness, light-headedness, numbness and headaches.  Hematological: Negative.  Negative for adenopathy. Does not bruise/bleed easily.  Psychiatric/Behavioral: Negative for suicidal ideas, hallucinations, behavioral problems, confusion, sleep disturbance, self-injury, dysphoric mood, decreased concentration and agitation. The patient is not nervous/anxious and is not hyperactive.        Objective:   Physical Exam  Constitutional: She is oriented to person, place, and time. She appears well-developed and well-nourished. No distress.  HENT:  Head: Normocephalic and atraumatic.  Right Ear: External ear normal.  Left Ear: External ear normal.  Nose: Nose normal.  Mouth/Throat: Oropharynx is clear and moist. No oropharyngeal exudate.  Eyes: Conjunctivae and EOM are normal. Pupils are equal, round, and reactive to light. Right eye exhibits no discharge. Left eye exhibits no discharge. No scleral icterus.  Neck: Normal range of motion. Neck  supple. No JVD present. No tracheal deviation present. No thyromegaly present.  Cardiovascular: Normal rate, regular rhythm, normal heart sounds and intact distal pulses.  Exam reveals no gallop and no friction rub.   No murmur heard. Pulmonary/Chest: Effort normal and breath sounds normal. No respiratory distress. She has no wheezes. She has no rales. She exhibits no tenderness.  Abdominal: Soft. Bowel sounds are normal. She exhibits no distension and no mass. There is no tenderness. There is no rebound and no guarding.  Genitourinary:  Genitourinary examination was deferred  Musculoskeletal: Normal range of motion. She exhibits tenderness. She exhibits no edema.  This patient had significant decrease in range of motion Her straight leg raising test on the left side was 10 Straight leg raising test on the right-sided was 20 Her weight was 325 pounds She is very tender at the L4-L5 level bilaterally in the paraspinous musculature area Torsion tests was dramatically positive especially on the right side This patient ambulated with the assistance of a walker or assisted by her family  Lymphadenopathy:    She has no cervical adenopathy.  Neurological: She is alert and oriented to person, place, and time. She has normal reflexes. She displays normal reflexes. No cranial nerve deficit. She exhibits normal muscle tone. Coordination normal.  Skin: Skin is warm and dry. No rash noted. She is not diaphoretic. No erythema.  Psychiatric: She has a normal mood and affect. Her behavior is normal. Thought content normal.  Nursing note and vitals reviewed.         Assessment & Plan:  Assessment 1 chronic low back pain 2 lumbar degenerative disc disease  3 lumbar radiculopathy 4 lumbar spinal stenosis 5 status post lumbar spine surgery with fusion 6 morbid obesity   Plan of management 1 Will plan a caudal epidural steroid injection for her in the next 2 day 2 Will contemplate caudal  neuroplasty if the initial procedure is not effective 3 since she is allergic to morphine and morphine metabolites were try her on Nucynta 50 mg every 12 hours and will give her 30 pills and assess the response 4 Will consider given her meloxicam 7.5 mg twice a day after meals It is very difficult to offer this lady any other opioids since she is very allergic to morphine and the metabolites of morphine I spent some time discussing with her the need to go on an aggressive weight management program and to rule this with the assistance of a dietitian and the physical therapist I pointed out to her that her her pain problems would probably continue as long as she has a weight problem She appears to accept and understand this realization We'll plan to see in the next 2 days for a caudal epidural steroid injection   New patient       level V   Lance Bosch M.D.

## 2015-08-14 NOTE — Progress Notes (Signed)
Safety precautions to be maintained throughout the outpatient stay will include: orient to surroundings, keep bed in low position, maintain call bell within reach at all times, provide assistance with transfer out of bed and ambulation.  

## 2015-08-15 ENCOUNTER — Telehealth: Payer: Self-pay | Admitting: Anesthesiology

## 2015-08-15 NOTE — Telephone Encounter (Signed)
Ins will not cover this med / can she get diff meds script

## 2015-08-16 ENCOUNTER — Telehealth: Payer: Self-pay | Admitting: Radiology

## 2015-08-16 ENCOUNTER — Other Ambulatory Visit: Payer: Self-pay | Admitting: Neurosurgery

## 2015-08-16 ENCOUNTER — Ambulatory Visit
Admission: RE | Admit: 2015-08-16 | Discharge: 2015-08-16 | Disposition: A | Discharge status: Home / Self Care | Payer: Commercial Managed Care - HMO | Source: Ambulatory Visit | Attending: Neurosurgery | Admitting: Neurosurgery

## 2015-08-16 DIAGNOSIS — G971 Other reaction to spinal and lumbar puncture: Secondary | ICD-10-CM | POA: Diagnosis not present

## 2015-08-16 MED ORDER — MELOXICAM 7.5 MG PO TABS: 7.5000 mg | ORAL_TABLET | Freq: Two times a day (BID) | ORAL | Status: DC

## 2015-08-16 MED ORDER — IOHEXOL 180 MG/ML  SOLN
1.0000 mL | Freq: Once | INTRAMUSCULAR | Status: DC | PRN
Start: 2015-08-16 — End: 2015-08-17
  Administered 2015-08-16: 1 mL via EPIDURAL

## 2015-08-16 NOTE — Discharge Instructions (Signed)
Blood patch Discharge Instructions ° °1. Go home and rest quietly for the next 24 hours.  It is important to lie flat for the next 24 hours.  Get up only to go to the restroom.  You may lie in the bed or on a couch on your back, your stomach, your left side or your right side.  You may have one pillow under your head.  You may have pillows between your knees while you are on your side or under your knees while you are on your back. ° °2. DO NOT drive today.  Recline the seat as far back as it will go, while still wearing your seat belt, on the way home. ° °3. You may get up to go to the bathroom as needed.  You may sit up for 10 minutes to eat.  You may resume your normal diet and medications unless otherwise indicated.  Drink lots of extra fluids today and tomorrow.  ° °4. You may resume normal activities after your 24 hours of bed rest is over; however, do not exert yourself strongly or do any heavy lifting tomorrow. ° °5. Call your physician for a follow-up appointment.  ° °6. If you have any questions  after you arrive home, please call [phone]. ° °Discharge instructions have been explained to the patient.  The patient, or the person responsible for the patient, fully understands these instructions. °

## 2015-08-16 NOTE — Progress Notes (Signed)
15 cc's of blood drawn from right [facility] for blood patch.  Site is unremarkable and pt tolerated procedure well.

## 2015-08-16 NOTE — Telephone Encounter (Signed)
Pt states she has had a headache for the past 3-4 days post myelo. Explained blood patch and that she would be on bedrest for the next 24 hours

## 2015-08-16 NOTE — Telephone Encounter (Signed)
Pt informed Meloxicam sent to pharmacy. Instructed on how to take it.

## 2015-08-16 NOTE — Progress Notes (Signed)
   Subjective:    Patient ID: Kimberly Cowan, female    DOB: 26-Aug-1966, 49 y.o.   MRN: [id number]  HPI    Review of Systems     Objective:   Physical Exam        Assessment & Plan:   This patient is allergic to morphine and many morphine derivatives I have prescribed Nucynta for her but this is going through the approval process She requests some medication for pain and I am reluctant to give her any drug but has any morphine derivatives Therefore I would prescribe one week supply of meloxicam 7.5 mg 3 times a day after meals and will await the approval process for [place] M.D.

## 2015-08-16 NOTE — Addendum Note (Signed)
Addended by: Lance Bosch on: 08/16/2015 08:20 AM   Modules accepted: Orders

## 2015-08-21 LAB — TOXASSURE SELECT 13 (MW), URINE: PDF: 0

## 2015-09-06 ENCOUNTER — Other Ambulatory Visit: Payer: Self-pay | Admitting: Anesthesiology

## 2015-10-04 ENCOUNTER — Ambulatory Visit: Payer: Commercial Managed Care - HMO | Admitting: Anesthesiology

## 2015-10-08 ENCOUNTER — Ambulatory Visit: Payer: Commercial Managed Care - HMO | Admitting: Anesthesiology

## 2015-10-14 ENCOUNTER — Encounter: Payer: Self-pay | Admitting: Internal Medicine

## 2015-11-03 ENCOUNTER — Encounter: Payer: Self-pay | Admitting: Emergency Medicine

## 2015-11-03 ENCOUNTER — Emergency Department
Admission: EM | Admit: 2015-11-03 | Discharge: 2015-11-03 | Disposition: A | Discharge status: Home / Self Care | Payer: Commercial Managed Care - HMO | Attending: Emergency Medicine | Admitting: Emergency Medicine

## 2015-11-03 DIAGNOSIS — R030 Elevated blood-pressure reading, without diagnosis of hypertension: Secondary | ICD-10-CM | POA: Insufficient documentation

## 2015-11-03 DIAGNOSIS — M545 Low back pain, unspecified: Secondary | ICD-10-CM

## 2015-11-03 DIAGNOSIS — Z880 Allergy status to penicillin: Secondary | ICD-10-CM | POA: Insufficient documentation

## 2015-11-03 DIAGNOSIS — F329 Major depressive disorder, single episode, unspecified: Secondary | ICD-10-CM | POA: Insufficient documentation

## 2015-11-03 DIAGNOSIS — F419 Anxiety disorder, unspecified: Secondary | ICD-10-CM | POA: Insufficient documentation

## 2015-11-03 DIAGNOSIS — Z791 Long term (current) use of non-steroidal anti-inflammatories (NSAID): Secondary | ICD-10-CM | POA: Insufficient documentation

## 2015-11-03 DIAGNOSIS — Z91040 Latex allergy status: Secondary | ICD-10-CM | POA: Insufficient documentation

## 2015-11-03 DIAGNOSIS — Z79899 Other long term (current) drug therapy: Secondary | ICD-10-CM | POA: Insufficient documentation

## 2015-11-03 HISTORY — DX: Other chronic pain: M54.9

## 2015-11-03 HISTORY — DX: Other chronic pain: G89.29

## 2015-11-03 MED ORDER — METHOCARBAMOL 500 MG PO TABS
1000.0000 mg | ORAL_TABLET | Freq: Once | ORAL | Status: AC
  Administered 2015-11-03: 1000 mg via ORAL
  Filled 2015-11-03: qty 2

## 2015-11-03 MED ORDER — OXYCODONE-ACETAMINOPHEN 7.5-325 MG PO TABS
1.0000 | ORAL_TABLET | Freq: Four times a day (QID) | ORAL | Status: DC | PRN
Start: 2015-11-03 — End: 2016-03-18

## 2015-11-03 MED ORDER — CYCLOBENZAPRINE HCL 10 MG PO TABS: 10.0000 mg | ORAL_TABLET | Freq: Three times a day (TID) | ORAL | Status: DC | PRN

## 2015-11-03 MED ORDER — OXYCODONE-ACETAMINOPHEN 5-325 MG PO TABS
2.0000 | ORAL_TABLET | Freq: Once | ORAL | Status: AC
  Administered 2015-11-03: 2 via ORAL
  Filled 2015-11-03: qty 2

## 2015-11-03 NOTE — ED Provider Notes (Signed)
[facility] Emergency Department Provider Note  ____________________________________________  Time seen: Approximately 3:07 PM  I have reviewed the triage vital signs and the nursing notes.   HISTORY  Chief Complaint Back Pain    HPI Kimberly Cowan is a 50 y.o. female patient complaining of severe back pain for 2 days. Patient has history of low back pain secondary to bulging disc. Patient has 2 back surgeries and has been doing well with the Nucynta and mobic. Patient denies any provocative incident for her onset of severe back pain in the last 2 days. Patient denies any radicular component to this pain. She denies any bladder or bowel dysfunction. Patient describes the pain as sharp and no palliative measures for this complaint. Patient rates the pain as a 10 over 10. Past Medical History  Diagnosis Date  . Sleep apnea     no cpcap     last sleep study > 4 yrs  . Anxiety   . Depression   . Arthritis   . Headache(784.0)   . Clinical depression 04/04/2012  . Chronic back pain     Patient Active Problem List   Diagnosis Date Noted  . Back pain at L4-L5 level 08/14/2015  . DDD (degenerative disc disease), lumbosacral 08/14/2015  . Bilateral lumbar radiculopathy 08/14/2015  . Spinal stenosis of lumbar region 08/14/2015  . S/P spinal surgery 08/14/2015  . Status post gastric bypass for obesity 07/04/2015  . Hyperglycemia 07/01/2015  . Spinal stenosis, lumbar region, with neurogenic claudication 11/11/2012  . Clinical depression 04/04/2012  . Headache, migraine 04/04/2012    Past Surgical History  Procedure Laterality Date  . C sections    . Tonsillectomy    . Gastric bypass  2005  . Abdominal hysterectomy  10/2006    cervix and ovaries remain  . Breast reduction surgery    . Reduction mammaplasty Bilateral     2001  . Back surgery  2014    lumbar fusion  . Back surgery  2009    discectomy    Current Outpatient Rx  Name  Route   Sig  Dispense  Refill  . cyclobenzaprine (FLEXERIL) 10 MG tablet   Oral   Take 1 tablet (10 mg total) by mouth every 8 (eight) hours as needed for muscle spasms.   15 tablet   0   . meloxicam (MOBIC) 7.5 MG tablet   Oral   Take 1 tablet (7.5 mg total) by mouth 2 (two) times daily after a meal.   28 tablet   0   . oxyCODONE-acetaminophen (PERCOCET) 7.5-325 MG tablet   Oral   Take 1 tablet by mouth every 6 (six) hours as needed for severe pain.   12 tablet   0   . rizatriptan (MAXALT-MLT) 10 MG disintegrating tablet   Oral   Take 1 tablet (10 mg total) by mouth as needed for migraine. May repeat in 2 hours if needed   10 tablet   0   . Tapentadol HCl (NUCYNTA ER) 50 MG TB12   Oral   Take 50 mg by mouth 2 (two) times daily.   30 tablet   0     Allergies Morphine and related; Penicillins; and Latex  Family History  Problem Relation Age of Onset  . Diabetes Mother   . Hypertension Mother   . Diabetes Sister   . Hypertension Father   . Hypertension Sister   . Breast cancer Neg Hx     Social History  Social History  Substance Use Topics  . Smoking status: Never Smoker   . Smokeless tobacco: None  . Alcohol Use: No    Review of Systems Constitutional: No fever/chills Eyes: No visual changes. ENT: No sore throat. Cardiovascular: Denies chest pain. Respiratory: Denies shortness of breath. Gastrointestinal: No abdominal pain.  No nausea, no vomiting.  No diarrhea.  No constipation. Genitourinary: Negative for dysuria. Musculoskeletal: Positive for back pain. Skin: Negative for rash. Neurological: Negative for headaches, focal weakness or numbness. Psychiatric:Anxiety depression Allergic/Immunilogical: Morphine and penicillin 10-point ROS otherwise negative.  ____________________________________________   PHYSICAL EXAM:  VITAL SIGNS: ED Triage Vitals  Enc Vitals Group     BP 11/03/15 1448 153/78 mmHg     Pulse Rate 11/03/15 1448 77     Resp 11/03/15  1448 20     Temp 11/03/15 1448 98.1 F (36.7 C)     Temp Source 11/03/15 1448 Oral     SpO2 11/03/15 1448 97 %     Weight 11/03/15 1448 326 lb (147.873 kg)     Height 11/03/15 1448 5' 7.5" (1.715 m)     Head Cir --      Peak Flow --      Pain Score 11/03/15 1449 10     Pain Loc --      Pain Edu? --      Excl. in [place]? --     Constitutional: Alert and oriented. Well appearing and in no acute distress. Morbid obesity Eyes: Conjunctivae are normal. PERRL. EOMI. Head: Atraumatic. Nose: No congestion/rhinnorhea. Mouth/Throat: Mucous membranes are moist.  Oropharynx non-erythematous. Neck: No stridor. No cervical spine tenderness to palpation. Hematological/Lymphatic/Immunilogical: No cervical lymphadenopathy. Cardiovascular: Normal rate, regular rhythm. Grossly normal heart sounds.  Good peripheral circulation. Elevated systolic blood pressure Respiratory: Normal respiratory effort.  No retractions. Lungs CTAB. Gastrointestinal: Soft and nontender. No distention. No abdominal bruits. No CVA tenderness. Musculoskeletal: Exam is tender by patient habitus, surgical scars consistent with history. No obvious spinal deformity. Moderate guarding with palpation at L3-S1. Patient has decreased range of motion's all fields. Patient is negative straight leg test. Neurologic:  Normal speech and language. No gross focal neurologic deficits are appreciated. No gait instability. Skin:  Skin is warm, dry and intact. No rash noted. Psychiatric: Mood and affect are normal. Speech and behavior are normal.  ____________________________________________   LABS (all labs ordered are listed, but only abnormal results are displayed)  Labs Reviewed - No data to display ____________________________________________  EKG   ____________________________________________  RADIOLOGY   ____________________________________________   PROCEDURES  Procedure(s) performed: None  Critical Care performed:  No  ____________________________________________   INITIAL IMPRESSION / ASSESSMENT AND PLAN / ED COURSE  Pertinent labs & imaging results that were available during my care of the patient were reviewed by me and considered in my medical decision making (see chart for details).  Acute low back pain. Patient given discharge Instructions. Patient advised follow-up with treating doctor. Patient given a prescription for Percocet and Robaxin. ____________________________________________   FINAL CLINICAL IMPRESSION(S) / ED DIAGNOSES  Final diagnoses:  Acute low back pain      Sable Feil, PA-C 11/03/15 1533  Wandra Arthurs, MD 11/03/15 2257

## 2015-11-03 NOTE — ED Notes (Signed)
Pt presents to triage room with complaints of headache, sinus pressure, nausea. Pt talks in complete sentences no respiratory distress noted.

## 2015-11-03 NOTE — Discharge Instructions (Signed)
Take medication as directed and follow-up with treating doctor for back pain.

## 2015-11-03 NOTE — ED Notes (Signed)
Pt presents to ED with c/o "severe back pain". Pt states that she normally has chronic back pain but 2 days the pain increased, pt denies injury at this time.

## 2015-11-07 DIAGNOSIS — Z6841 Body Mass Index (BMI) 40.0 and over, adult: Secondary | ICD-10-CM | POA: Diagnosis not present

## 2015-11-07 DIAGNOSIS — M47816 Spondylosis without myelopathy or radiculopathy, lumbar region: Secondary | ICD-10-CM | POA: Diagnosis not present

## 2015-12-09 DIAGNOSIS — M4806 Spinal stenosis, lumbar region: Secondary | ICD-10-CM | POA: Diagnosis not present

## 2015-12-09 DIAGNOSIS — G4733 Obstructive sleep apnea (adult) (pediatric): Secondary | ICD-10-CM | POA: Diagnosis not present

## 2015-12-09 DIAGNOSIS — G971 Other reaction to spinal and lumbar puncture: Secondary | ICD-10-CM | POA: Diagnosis not present

## 2015-12-09 DIAGNOSIS — M5416 Radiculopathy, lumbar region: Secondary | ICD-10-CM | POA: Diagnosis not present

## 2015-12-09 DIAGNOSIS — F32A Depression, unspecified: Secondary | ICD-10-CM | POA: Insufficient documentation

## 2015-12-09 DIAGNOSIS — F329 Major depressive disorder, single episode, unspecified: Secondary | ICD-10-CM | POA: Diagnosis not present

## 2015-12-09 DIAGNOSIS — F419 Anxiety disorder, unspecified: Secondary | ICD-10-CM | POA: Insufficient documentation

## 2015-12-09 DIAGNOSIS — M4300 Spondylolysis, site unspecified: Secondary | ICD-10-CM | POA: Diagnosis not present

## 2015-12-09 DIAGNOSIS — M47816 Spondylosis without myelopathy or radiculopathy, lumbar region: Secondary | ICD-10-CM | POA: Diagnosis not present

## 2016-01-01 ENCOUNTER — Other Ambulatory Visit: Payer: Self-pay | Admitting: Internal Medicine

## 2016-01-09 DIAGNOSIS — M961 Postlaminectomy syndrome, not elsewhere classified: Secondary | ICD-10-CM | POA: Diagnosis not present

## 2016-01-09 DIAGNOSIS — G8929 Other chronic pain: Secondary | ICD-10-CM | POA: Diagnosis not present

## 2016-01-09 DIAGNOSIS — F341 Dysthymic disorder: Secondary | ICD-10-CM | POA: Diagnosis not present

## 2016-01-09 DIAGNOSIS — F411 Generalized anxiety disorder: Secondary | ICD-10-CM | POA: Diagnosis not present

## 2016-01-09 DIAGNOSIS — F4542 Pain disorder with related psychological factors: Secondary | ICD-10-CM | POA: Diagnosis not present

## 2016-01-09 DIAGNOSIS — M5442 Lumbago with sciatica, left side: Secondary | ICD-10-CM | POA: Diagnosis not present

## 2016-01-09 DIAGNOSIS — M5441 Lumbago with sciatica, right side: Secondary | ICD-10-CM | POA: Diagnosis not present

## 2016-01-13 DIAGNOSIS — F419 Anxiety disorder, unspecified: Secondary | ICD-10-CM | POA: Diagnosis not present

## 2016-01-13 DIAGNOSIS — F329 Major depressive disorder, single episode, unspecified: Secondary | ICD-10-CM | POA: Diagnosis not present

## 2016-01-13 DIAGNOSIS — M961 Postlaminectomy syndrome, not elsewhere classified: Secondary | ICD-10-CM | POA: Diagnosis not present

## 2016-01-13 DIAGNOSIS — G4733 Obstructive sleep apnea (adult) (pediatric): Secondary | ICD-10-CM | POA: Diagnosis not present

## 2016-01-16 ENCOUNTER — Emergency Department
Admission: EM | Admit: 2016-01-16 | Discharge: 2016-01-16 | Disposition: A | Discharge status: Home / Self Care | Payer: Commercial Managed Care - HMO | Attending: Emergency Medicine | Admitting: Emergency Medicine

## 2016-01-16 ENCOUNTER — Encounter: Payer: Self-pay | Admitting: Emergency Medicine

## 2016-01-16 DIAGNOSIS — F329 Major depressive disorder, single episode, unspecified: Secondary | ICD-10-CM | POA: Diagnosis not present

## 2016-01-16 DIAGNOSIS — J069 Acute upper respiratory infection, unspecified: Secondary | ICD-10-CM | POA: Insufficient documentation

## 2016-01-16 DIAGNOSIS — M1990 Unspecified osteoarthritis, unspecified site: Secondary | ICD-10-CM | POA: Insufficient documentation

## 2016-01-16 DIAGNOSIS — R112 Nausea with vomiting, unspecified: Secondary | ICD-10-CM | POA: Diagnosis not present

## 2016-01-16 DIAGNOSIS — Z79891 Long term (current) use of opiate analgesic: Secondary | ICD-10-CM | POA: Insufficient documentation

## 2016-01-16 DIAGNOSIS — Z91040 Latex allergy status: Secondary | ICD-10-CM | POA: Diagnosis not present

## 2016-01-16 DIAGNOSIS — R509 Fever, unspecified: Secondary | ICD-10-CM | POA: Diagnosis present

## 2016-01-16 DIAGNOSIS — Z791 Long term (current) use of non-steroidal anti-inflammatories (NSAID): Secondary | ICD-10-CM | POA: Insufficient documentation

## 2016-01-16 LAB — POCT RAPID STREP A: Streptococcus, Group A Screen (Direct): NEGATIVE

## 2016-01-16 MED ORDER — GUAIFENESIN-CODEINE 100-10 MG/5ML PO SOLN: 10.0000 mL | Freq: Three times a day (TID) | ORAL | Status: DC | PRN

## 2016-01-16 MED ORDER — CETIRIZINE HCL 10 MG PO CAPS: 10.0000 mg | ORAL_CAPSULE | Freq: Every day | ORAL | Status: DC

## 2016-01-16 NOTE — Discharge Instructions (Signed)
Upper Respiratory Infection, Adult Most upper respiratory infections (URIs) are a viral infection of the air passages leading to the lungs. A URI affects the nose, throat, and upper air passages. The most common type of URI is nasopharyngitis and is typically referred to as "the common cold." URIs run their course and usually go away on their own. Most of the time, a URI does not require medical attention, but sometimes a bacterial infection in the upper airways can follow a viral infection. This is called a secondary infection. Sinus and middle ear infections are common types of secondary upper respiratory infections. Bacterial pneumonia can also complicate a URI. A URI can worsen asthma and chronic obstructive pulmonary disease (COPD). Sometimes, these complications can require emergency medical care and may be life threatening.  CAUSES Almost all URIs are caused by viruses. A virus is a type of germ and can spread from one person to another.  RISKS FACTORS You may be at risk for a URI if:   You smoke.   You have chronic heart or lung disease.  You have a weakened defense (immune) system.   You are very young or very old.   You have nasal allergies or asthma.  You work in crowded or poorly ventilated areas.  You work in health care facilities or schools. SIGNS AND SYMPTOMS  Symptoms typically develop 2-3 days after you come in contact with a cold virus. Most viral URIs last 7-10 days. However, viral URIs from the influenza virus (flu virus) can last 14-18 days and are typically more severe. Symptoms may include:   Runny or stuffy (congested) nose.   Sneezing.   Cough.   Sore throat.   Headache.   Fatigue.   Fever.   Loss of appetite.   Pain in your forehead, behind your eyes, and over your cheekbones (sinus pain).  Muscle aches.  DIAGNOSIS  Your health care provider may diagnose a URI by:  Physical exam.  Tests to check that your symptoms are not due to  another condition such as:  Strep throat.  Sinusitis.  Pneumonia.  Asthma. TREATMENT  A URI goes away on its own with time. It cannot be cured with medicines, but medicines may be prescribed or recommended to relieve symptoms. Medicines may help:  Reduce your fever.  Reduce your cough.  Relieve nasal congestion. HOME CARE INSTRUCTIONS   Take medicines only as directed by your health care provider.   Gargle warm saltwater or take cough drops to comfort your throat as directed by your health care provider.  Use a warm mist humidifier or inhale steam from a shower to increase air moisture. This may make it easier to breathe.  Drink enough fluid to keep your urine clear or pale yellow.   Eat soups and other clear broths and maintain good nutrition.   Rest as needed.   Return to work when your temperature has returned to normal or as your health care provider advises. You may need to stay home longer to avoid infecting others. You can also use a face mask and careful hand washing to prevent spread of the virus.  Increase the usage of your inhaler if you have asthma.   Do not use any tobacco products, including cigarettes, chewing tobacco, or electronic cigarettes. If you need help quitting, ask your health care provider. PREVENTION  The best way to protect yourself from getting a cold is to practice good hygiene.   Avoid oral or hand contact with people with cold   symptoms.   Wash your hands often if contact occurs.  There is no clear evidence that vitamin C, vitamin E, echinacea, or exercise reduces the chance of developing a cold. However, it is always recommended to get plenty of rest, exercise, and practice good nutrition.  SEEK MEDICAL CARE IF:   You are getting worse rather than better.   Your symptoms are not controlled by medicine.   You have chills.  You have worsening shortness of breath.  You have brown or red mucus.  You have yellow or brown nasal  discharge.  You have pain in your face, especially when you bend forward.  You have a fever.  You have swollen neck glands.  You have pain while swallowing.  You have white areas in the back of your throat. SEEK IMMEDIATE MEDICAL CARE IF:   You have severe or persistent:  Headache.  Ear pain.  Sinus pain.  Chest pain.  You have chronic lung disease and any of the following:  Wheezing.  Prolonged cough.  Coughing up blood.  A change in your usual mucus.  You have a stiff neck.  You have changes in your:  Vision.  Hearing.  Thinking.  Mood. MAKE SURE YOU:   Understand these instructions.  Will watch your condition.  Will get help right away if you are not doing well or get worse.   This information is not intended to replace advice given to you by your health care provider. Make sure you discuss any questions you have with your health care provider.   Document Released: 03/10/2001 Document Revised: 01/29/2015 Document Reviewed: 12/20/2013 Elsevier Interactive Patient Education 2016 [employer]  

## 2016-01-16 NOTE — ED Notes (Signed)
Pt presents to ED with sore throat, nasal congestion, vomiting and fever for 3-4 days. Pt states fever up to 102.

## 2016-01-16 NOTE — ED Provider Notes (Signed)
CSN: [id number]     Arrival date & time 01/16/16  1112 History   First MD Initiated Contact with Patient 01/16/16 1233     Chief Complaint  Patient presents with  . Sore Throat  . Fever    HPI   50 year old female who presents to the emergency department for evaluation of cough, sore throat, fever, nasal congestion and vomiting for the past 3 days. She reports recent exposure to strep throat and wants to make sure that is not the problem.   Past Medical History  Diagnosis Date  . Sleep apnea     no cpcap     last sleep study > 4 yrs  . Anxiety   . Depression   . Arthritis   . Headache(784.0)   . Clinical depression 04/04/2012  . Chronic back pain    Past Surgical History  Procedure Laterality Date  . C sections    . Tonsillectomy    . Gastric bypass  2005  . Abdominal hysterectomy  10/2006    cervix and ovaries remain  . Breast reduction surgery    . Reduction mammaplasty Bilateral     2001  . Back surgery  2014    lumbar fusion  . Back surgery  2009    discectomy   Family History  Problem Relation Age of Onset  . Diabetes Mother   . Hypertension Mother   . Diabetes Sister   . Hypertension Father   . Hypertension Sister   . Breast cancer Neg Hx    Social History  Substance Use Topics  . Smoking status: Never Smoker   . Smokeless tobacco: None  . Alcohol Use: No   OB History    No data available     Review of Systems  Constitutional: Positive for fever and chills.  HENT: Positive for sore throat. Negative for ear pain, sneezing and voice change.   Respiratory: Positive for cough. Negative for shortness of breath and wheezing.   Gastrointestinal: Positive for nausea and vomiting. Negative for abdominal pain and diarrhea.  Skin: Negative for rash.  Neurological: Negative for weakness and light-headedness.      Allergies  Morphine and related; Penicillins; and Latex  Home Medications   Prior to Admission medications   Medication Sig Start Date End  Date Taking? Authorizing Provider  Cetirizine HCl 10 MG CAPS Take 1 capsule (10 mg total) by mouth daily. 01/16/16   Victorino Dike, FNP  cyclobenzaprine (FLEXERIL) 10 MG tablet Take 1 tablet (10 mg total) by mouth every 8 (eight) hours as needed for muscle spasms. 11/03/15   Sable Feil, PA-C  guaiFENesin-codeine 100-10 MG/5ML syrup Take 10 mLs by mouth 3 (three) times daily as needed. 01/16/16   Victorino Dike, FNP  meloxicam (MOBIC) 7.5 MG tablet Take 1 tablet (7.5 mg total) by mouth 2 (two) times daily after a meal. 08/16/15   Lance Bosch, MD  oxyCODONE-acetaminophen (PERCOCET) 7.5-325 MG tablet Take 1 tablet by mouth every 6 (six) hours as needed for severe pain. 11/03/15   Sable Feil, PA-C  rizatriptan (MAXALT-MLT) 10 MG disintegrating tablet TAKE 1 TABLET BY MOUTH AS NEEDED FOR MIGRAINE. MAY REPEAT IN 2 HOURS IF NEEDED 01/01/16   Glean Hess, MD  Tapentadol HCl (NUCYNTA ER) 50 MG TB12 Take 50 mg by mouth 2 (two) times daily. 08/14/15   Lance Bosch, MD   BP 136/88 mmHg  Pulse 80  Temp(Src) 98.7 F (37.1 C) (Oral)  Resp 16  Ht 5' 7.5" (1.715 m)  Wt 138.801 kg  BMI 47.19 kg/m2  SpO2 94% Physical Exam  Constitutional: She is oriented to person, place, and time. She appears well-developed and well-nourished.  HENT:  Nose: Nose normal.  Mouth/Throat: Uvula is midline and mucous membranes are normal. No oral lesions. No uvula swelling. Posterior oropharyngeal erythema present. No oropharyngeal exudate or posterior oropharyngeal edema.  Eyes: Conjunctivae are normal.  Neck: Normal range of motion.  Pulmonary/Chest: Effort normal and breath sounds normal.  Musculoskeletal: Normal range of motion.  Neurological: She is alert and oriented to person, place, and time.  Skin: Skin is warm and dry. She is not diaphoretic.  Psychiatric: She has a normal mood and affect. Her behavior is normal. Judgment and thought content normal.  Nursing note and vitals reviewed.   ED Course   Procedures (including critical care time) Labs Review Labs Reviewed  CULTURE, GROUP A STREP [facility])  POCT RAPID STREP A    Imaging Review No results found. I have personally reviewed and evaluated these images and lab results as part of my medical decision-making.   EKG Interpretation None      MDM   Final diagnoses:  Upper respiratory infection    No evidence of bacterial source for pharyngitis. Patient will be treated for persistent cough that prevents sleep with Robitussin AC and cetirizine. She denies vomiting today and abdominal pain. She is to follow up with her PCP for symptoms that do not improve over the next few days. She is to return to the ER for symptoms that change or worsen if unable to schedule an appointment.    Victorino Dike, FNP 01/16/16 1721  Delman Kitten, MD 01/17/16 [phone]

## 2016-01-17 ENCOUNTER — Encounter: Payer: Self-pay | Admitting: Internal Medicine

## 2016-01-17 ENCOUNTER — Telehealth: Payer: Self-pay

## 2016-01-17 ENCOUNTER — Other Ambulatory Visit: Payer: Self-pay | Admitting: Internal Medicine

## 2016-01-17 DIAGNOSIS — M545 Low back pain, unspecified: Secondary | ICD-10-CM

## 2016-01-17 NOTE — Telephone Encounter (Signed)
Patient called in this morning and states that she has been seeing Dr. Trenton Gammon. She states that he would like to refer to Pain Management with Dr. Dyann Ruddle at [facility]. She states that the office told her she would need a referral from her Primary MD for this appointment. She states that it would need to be back dated for March. Dr. Marney Setting # [phone]

## 2016-01-18 LAB — CULTURE, GROUP A STREP (THRC)

## 2016-01-29 DIAGNOSIS — M961 Postlaminectomy syndrome, not elsewhere classified: Secondary | ICD-10-CM | POA: Diagnosis not present

## 2016-01-29 DIAGNOSIS — Z6841 Body Mass Index (BMI) 40.0 and over, adult: Secondary | ICD-10-CM | POA: Diagnosis not present

## 2016-01-31 DIAGNOSIS — M961 Postlaminectomy syndrome, not elsewhere classified: Secondary | ICD-10-CM | POA: Diagnosis not present

## 2016-02-18 ENCOUNTER — Encounter: Payer: Self-pay | Admitting: *Deleted

## 2016-02-18 ENCOUNTER — Emergency Department
Admission: EM | Admit: 2016-02-18 | Discharge: 2016-02-18 | Disposition: A | Discharge status: Home / Self Care | Payer: Commercial Managed Care - HMO | Attending: Emergency Medicine | Admitting: Emergency Medicine

## 2016-02-18 ENCOUNTER — Emergency Department: Payer: Commercial Managed Care - HMO

## 2016-02-18 DIAGNOSIS — Y999 Unspecified external cause status: Secondary | ICD-10-CM | POA: Diagnosis not present

## 2016-02-18 DIAGNOSIS — Z79899 Other long term (current) drug therapy: Secondary | ICD-10-CM | POA: Diagnosis not present

## 2016-02-18 DIAGNOSIS — F329 Major depressive disorder, single episode, unspecified: Secondary | ICD-10-CM | POA: Insufficient documentation

## 2016-02-18 DIAGNOSIS — M5137 Other intervertebral disc degeneration, lumbosacral region: Secondary | ICD-10-CM | POA: Diagnosis not present

## 2016-02-18 DIAGNOSIS — Z791 Long term (current) use of non-steroidal anti-inflammatories (NSAID): Secondary | ICD-10-CM | POA: Insufficient documentation

## 2016-02-18 DIAGNOSIS — S6991XA Unspecified injury of right wrist, hand and finger(s), initial encounter: Secondary | ICD-10-CM | POA: Diagnosis not present

## 2016-02-18 DIAGNOSIS — S63501A Unspecified sprain of right wrist, initial encounter: Secondary | ICD-10-CM

## 2016-02-18 DIAGNOSIS — Y929 Unspecified place or not applicable: Secondary | ICD-10-CM | POA: Diagnosis not present

## 2016-02-18 DIAGNOSIS — S6391XA Sprain of unspecified part of right wrist and hand, initial encounter: Secondary | ICD-10-CM | POA: Diagnosis not present

## 2016-02-18 DIAGNOSIS — X58XXXA Exposure to other specified factors, initial encounter: Secondary | ICD-10-CM | POA: Insufficient documentation

## 2016-02-18 DIAGNOSIS — Y939 Activity, unspecified: Secondary | ICD-10-CM | POA: Insufficient documentation

## 2016-02-18 MED ORDER — BACLOFEN 10 MG PO TABS: 10.0000 mg | ORAL_TABLET | Freq: Three times a day (TID) | ORAL | Status: DC

## 2016-02-18 NOTE — ED Provider Notes (Signed)
[facility] Emergency Department Provider Note  ____________________________________________  Time seen: Approximately 1:12 PM  I have reviewed the triage vital signs and the nursing notes.   HISTORY  Chief Complaint Wrist Pain    HPI Kimberly Cowan is a 50 y.o. female presents for evaluation of right wrist pain 2 weeks. Patient states that she fell and injured her wrist 2 weeks ago but continues to have pain. Has concern for lingering fracture. Denies any numbness or tingling rates her pain as a 7/10 at this time.   Past Medical History  Diagnosis Date  . Sleep apnea     no cpcap     last sleep study > 4 yrs  . Anxiety   . Depression   . Arthritis   . Headache(784.0)   . Clinical depression 04/04/2012  . Chronic back pain     Patient Active Problem List   Diagnosis Date Noted  . Back pain at L4-L5 level 08/14/2015  . DDD (degenerative disc disease), lumbosacral 08/14/2015  . Bilateral lumbar radiculopathy 08/14/2015  . Spinal stenosis of lumbar region 08/14/2015  . S/P spinal surgery 08/14/2015  . Status post gastric bypass for obesity 07/04/2015  . Hyperglycemia 07/01/2015  . Spinal stenosis, lumbar region, with neurogenic claudication 11/11/2012  . Clinical depression 04/04/2012  . Headache, migraine 04/04/2012    Past Surgical History  Procedure Laterality Date  . C sections    . Tonsillectomy    . Gastric bypass  2005  . Abdominal hysterectomy  10/2006    cervix and ovaries remain  . Breast reduction surgery    . Reduction mammaplasty Bilateral     2001  . Back surgery  2014    lumbar fusion  . Back surgery  2009    discectomy    Current Outpatient Rx  Name  Route  Sig  Dispense  Refill  . baclofen (LIORESAL) 10 MG tablet   Oral   Take 1 tablet (10 mg total) by mouth 3 (three) times daily.   30 tablet   0   . Cetirizine HCl 10 MG CAPS   Oral   Take 1 capsule (10 mg total) by mouth daily.   30 capsule    3   . guaiFENesin-codeine 100-10 MG/5ML syrup   Oral   Take 10 mLs by mouth 3 (three) times daily as needed.   120 mL   0   . meloxicam (MOBIC) 7.5 MG tablet   Oral   Take 1 tablet (7.5 mg total) by mouth 2 (two) times daily after a meal.   28 tablet   0   . oxyCODONE-acetaminophen (PERCOCET) 7.5-325 MG tablet   Oral   Take 1 tablet by mouth every 6 (six) hours as needed for severe pain.   12 tablet   0   . rizatriptan (MAXALT-MLT) 10 MG disintegrating tablet      TAKE 1 TABLET BY MOUTH AS NEEDED FOR MIGRAINE. MAY REPEAT IN 2 HOURS IF NEEDED   10 tablet   0   . Tapentadol HCl (NUCYNTA ER) 50 MG TB12   Oral   Take 50 mg by mouth 2 (two) times daily.   30 tablet   0     Allergies Morphine and related; Penicillins; and Latex  Family History  Problem Relation Age of Onset  . Diabetes Mother   . Hypertension Mother   . Diabetes Sister   . Hypertension Father   . Hypertension Sister   . Breast  cancer Neg Hx     Social History Social History  Substance Use Topics  . Smoking status: Never Smoker   . Smokeless tobacco: None  . Alcohol Use: No    Review of Systems Constitutional: No fever/chills Musculoskeletal: Positive for right wrist pain. Neurological: Negative for headaches, focal weakness or numbness.  10-point ROS otherwise negative.  ____________________________________________   PHYSICAL EXAM:  VITAL SIGNS: ED Triage Vitals  Enc Vitals Group     BP 02/18/16 1258 158/71 mmHg     Pulse Rate 02/18/16 1258 94     Resp 02/18/16 1258 20     Temp 02/18/16 1258 98.2 F (36.8 C)     Temp Source 02/18/16 1258 Oral     SpO2 02/18/16 1258 98 %     Weight 02/18/16 1258 326 lb (147.873 kg)     Height 02/18/16 1258 5\' 7"  (1.702 m)     Head Cir --      Peak Flow --      Pain Score 02/18/16 1259 8     Pain Loc --      Pain Edu? --      Excl. in [place]? --     Constitutional: Alert and oriented. Well appearing and in no acute distress. Cardiovascular:  Normal rate, regular rhythm. Grossly normal heart sounds.  Good peripheral circulation. Respiratory: Normal respiratory effort.  No retractions. Lungs CTAB. Musculoskeletal: Right wrist pain with point tenderness and flexion. No obvious bruising or ecchymosis noted. Neurologic:  Normal speech and language. No gross focal neurologic deficits are appreciated. No gait instability. Skin:  Skin is warm, dry and intact. No rash noted. Psychiatric: Mood and affect are normal. Speech and behavior are normal.  ____________________________________________   LABS (all labs ordered are listed, but only abnormal results are displayed)  Labs Reviewed - No data to display ____________________________________________  RADIOLOGY  No acute osseous findings ____________________________________________   PROCEDURES  Procedure(s) performed: None  Critical Care performed: No  ____________________________________________   INITIAL IMPRESSION / ASSESSMENT AND PLAN / ED COURSE  Pertinent labs & imaging results that were available during my care of the patient were reviewed by me and considered in my medical decision making (see chart for details).  I wrist pain. Patient placed in cockup wrist splint and currently has medication home for pain control. Follow-up with orthopedics if needed. ____________________________________________   FINAL CLINICAL IMPRESSION(S) / ED DIAGNOSES  Final diagnoses:  Wrist sprain, right, initial encounter     This chart was dictated using voice recognition software/Dragon. Despite best efforts to proofread, errors can occur which can change the meaning. Any change was purely unintentional.   Arlyss Repress, PA-C 02/18/16 1456  Lavonia Drafts, MD 02/18/16 [phone]

## 2016-02-18 NOTE — ED Notes (Signed)
Right wrist painful, + pulses, fingers warm

## 2016-02-18 NOTE — Discharge Instructions (Signed)

## 2016-02-18 NOTE — ED Notes (Signed)
Pt fell and injured right wrist

## 2016-02-18 NOTE — ED Notes (Signed)
Right wrist splint applied by medic. +pulse post splint. Fingers warm,.

## 2016-03-02 DIAGNOSIS — M961 Postlaminectomy syndrome, not elsewhere classified: Secondary | ICD-10-CM | POA: Diagnosis not present

## 2016-03-02 DIAGNOSIS — Z6841 Body Mass Index (BMI) 40.0 and over, adult: Secondary | ICD-10-CM | POA: Diagnosis not present

## 2016-03-04 ENCOUNTER — Other Ambulatory Visit: Payer: Self-pay | Admitting: Internal Medicine

## 2016-03-04 ENCOUNTER — Ambulatory Visit (INDEPENDENT_AMBULATORY_CARE_PROVIDER_SITE_OTHER): Payer: Commercial Managed Care - HMO | Admitting: Internal Medicine

## 2016-03-04 ENCOUNTER — Encounter: Payer: Self-pay | Admitting: Internal Medicine

## 2016-03-04 VITALS — BP 128/82 | HR 67 | Resp 16 | Ht 67.0 in | Wt 326.0 lb

## 2016-03-04 DIAGNOSIS — Z9989 Dependence on other enabling machines and devices: Secondary | ICD-10-CM | POA: Insufficient documentation

## 2016-03-04 DIAGNOSIS — M5137 Other intervertebral disc degeneration, lumbosacral region: Secondary | ICD-10-CM | POA: Diagnosis not present

## 2016-03-04 DIAGNOSIS — G4733 Obstructive sleep apnea (adult) (pediatric): Secondary | ICD-10-CM | POA: Diagnosis not present

## 2016-03-04 NOTE — Progress Notes (Signed)
Date:  03/04/2016   Name:  Kimberly Cowan   DOB:  [birth year]   MRN:  [id number]   Chief Complaint: Sleep Apnea Sleep Apnea - Patient was diagnosed with sleep apnea over 13 years ago by Dr. Humphrey Rolls.   She has noticed that she does not rest as well and is snoring more.  She has never had another study since the weight loss surgery.  The machine is very old and she has never had a new mask or tubing.  She also has frequent AM headaches. Chronic back pain - she has been evaluated for a spinal stimulator and had excellent response.  Now waiting for surgery to have the permanent device implanted.  Review of Systems  Constitutional: Negative for fever, chills and fatigue.  HENT: Negative for hearing loss.   Respiratory: Positive for shortness of breath. Negative for cough and choking.   Cardiovascular: Negative for chest pain and palpitations.  Musculoskeletal: Positive for back pain and gait problem.  Neurological: Positive for headaches.  Psychiatric/Behavioral: Positive for sleep disturbance. Negative for dysphoric mood and decreased concentration.    Patient Active Problem List   Diagnosis Date Noted  . Back pain at L4-L5 level 08/14/2015  . DDD (degenerative disc disease), lumbosacral 08/14/2015  . Bilateral lumbar radiculopathy 08/14/2015  . Spinal stenosis of lumbar region 08/14/2015  . S/P spinal surgery 08/14/2015  . Status post gastric bypass for obesity 07/04/2015  . Hyperglycemia 07/01/2015  . Spinal stenosis, lumbar region, with neurogenic claudication 11/11/2012  . Clinical depression 04/04/2012  . Headache, migraine 04/04/2012    Prior to Admission medications   Medication Sig Start Date End Date Taking? Authorizing Provider  Cetirizine HCl 10 MG CAPS Take 1 capsule (10 mg total) by mouth daily. 01/16/16  Yes Cari B Triplett, FNP  DULoxetine (CYMBALTA) 30 MG capsule Take 1 capsule by mouth 2 (two) times daily. 02/05/16  Yes Historical Provider, MD  gabapentin  (NEURONTIN) 300 MG capsule Take 2 capsules by mouth at bedtime. 01/01/16  Yes Historical Provider, MD  meloxicam (MOBIC) 7.5 MG tablet Take 1 tablet (7.5 mg total) by mouth 2 (two) times daily after a meal. 08/16/15  Yes Lance Bosch, MD  oxyCODONE-acetaminophen (PERCOCET) 7.5-325 MG tablet Take 1 tablet by mouth every 6 (six) hours as needed for severe pain. 11/03/15  Yes Sable Feil, PA-C  rizatriptan (MAXALT-MLT) 10 MG disintegrating tablet TAKE 1 TABLET BY MOUTH AS NEEDED FOR MIGRAINE. MAY REPEAT IN 2 HOURS IF NEEDED 01/01/16  Yes Glean Hess, MD    Allergies  Allergen Reactions  . Morphine And Related Hives, Shortness Of Breath and Nausea And Vomiting    "can't breathe"  . Penicillins Hives, Shortness Of Breath and Nausea And Vomiting    "can't breathe"  . Latex Hives    Past Surgical History  Procedure Laterality Date  . C sections    . Tonsillectomy    . Gastric bypass  2005  . Abdominal hysterectomy  10/2006    cervix and ovaries remain  . Breast reduction surgery    . Reduction mammaplasty Bilateral     2001  . Back surgery  2014    lumbar fusion  . Back surgery  2009    discectomy    Social History  Substance Use Topics  . Smoking status: Never Smoker   . Smokeless tobacco: None  . Alcohol Use: No     Medication list has been reviewed and updated.   Physical Exam  Constitutional: She is oriented to person, place, and time. She appears well-developed and well-nourished. No distress.  HENT:  Head: Normocephalic and atraumatic.  Cardiovascular: Normal rate, regular rhythm and normal heart sounds.   Pulmonary/Chest: Effort normal and breath sounds normal. No respiratory distress. She has no wheezes. She has no rales.  Musculoskeletal:  Uses walker for ambulation  Neurological: She is alert and oriented to person, place, and time.  Skin: Skin is warm and dry. No rash noted.  Psychiatric: She has a normal mood and affect. Her behavior is normal. Thought  content normal.  Nursing note and vitals reviewed.   BP 128/82 mmHg  Pulse 67  Resp 16  Ht 5\' 7"  (1.702 m)  Wt 326 lb (147.873 kg)  BMI 51.05 kg/m2  SpO2 96%  Assessment and Plan: 1. Obstructive sleep apnea of adult Needs repeat sleep study/titration She will call insurance for approved equipment suppliers - Ambulatory referral to Sleep Studies  2. DDD (degenerative disc disease), lumbosacral Planning Spinal Stimulator implantation   Halina Maidens, MD [facility]  03/04/2016

## 2016-03-12 DIAGNOSIS — M4806 Spinal stenosis, lumbar region: Secondary | ICD-10-CM | POA: Diagnosis not present

## 2016-03-12 DIAGNOSIS — M961 Postlaminectomy syndrome, not elsewhere classified: Secondary | ICD-10-CM | POA: Diagnosis not present

## 2016-03-12 DIAGNOSIS — M47816 Spondylosis without myelopathy or radiculopathy, lumbar region: Secondary | ICD-10-CM | POA: Diagnosis not present

## 2016-03-12 DIAGNOSIS — R030 Elevated blood-pressure reading, without diagnosis of hypertension: Secondary | ICD-10-CM | POA: Diagnosis not present

## 2016-03-13 ENCOUNTER — Ambulatory Visit (INDEPENDENT_AMBULATORY_CARE_PROVIDER_SITE_OTHER): Payer: Commercial Managed Care - HMO | Admitting: Internal Medicine

## 2016-03-13 ENCOUNTER — Encounter: Payer: Self-pay | Admitting: Internal Medicine

## 2016-03-13 VITALS — BP 115/82 | HR 84 | Temp 98.5°F | Resp 16 | Ht 67.0 in | Wt 324.0 lb

## 2016-03-13 DIAGNOSIS — H00013 Hordeolum externum right eye, unspecified eyelid: Secondary | ICD-10-CM | POA: Diagnosis not present

## 2016-03-13 MED ORDER — SULFAMETHOXAZOLE-TRIMETHOPRIM 800-160 MG PO TABS: 1.0000 | ORAL_TABLET | Freq: Two times a day (BID) | ORAL | Status: DC

## 2016-03-13 MED ORDER — ERYTHROMYCIN 5 MG/GM OP OINT: 1.0000 "application " | TOPICAL_OINTMENT | Freq: Every day | OPHTHALMIC | Status: DC

## 2016-03-13 NOTE — Progress Notes (Signed)
Date:  03/13/2016   Name:  Kimberly Cowan   DOB:  04/15/1966   MRN:  [id number]   Chief Complaint: Eye Problem Left Eye has blister like sty on eye lid. Hard to see and painful. Since Tuesday and patient reports fever yesterday with temp of 100.  She has used warm compresses without much improvement.  She denies rash, blurred or double vision or trauma.   Review of Systems  Constitutional: Positive for fever.  Eyes: Positive for pain and visual disturbance.    Patient Active Problem List   Diagnosis Date Noted  . Obstructive sleep apnea of adult 03/04/2016  . Back pain at L4-L5 level 08/14/2015  . DDD (degenerative disc disease), lumbosacral 08/14/2015  . Bilateral lumbar radiculopathy 08/14/2015  . Spinal stenosis of lumbar region 08/14/2015  . S/P spinal surgery 08/14/2015  . Status post gastric bypass for obesity 07/04/2015  . Hyperglycemia 07/01/2015  . Spinal stenosis, lumbar region, with neurogenic claudication 11/11/2012  . Clinical depression 04/04/2012  . Headache, migraine 04/04/2012    Prior to Admission medications   Medication Sig Start Date End Date Taking? Authorizing Provider  Cetirizine HCl 10 MG CAPS Take 1 capsule (10 mg total) by mouth daily. 01/16/16  Yes Cari B Triplett, FNP  DULoxetine (CYMBALTA) 30 MG capsule Take 1 capsule by mouth 2 (two) times daily. 02/05/16  Yes Historical Provider, MD  gabapentin (NEURONTIN) 300 MG capsule Take 2 capsules by mouth at bedtime. 01/01/16  Yes Historical Provider, MD  meloxicam (MOBIC) 7.5 MG tablet Take 1 tablet (7.5 mg total) by mouth 2 (two) times daily after a meal. 08/16/15  Yes Lance Bosch, MD  oxyCODONE-acetaminophen (PERCOCET) 7.5-325 MG tablet Take 1 tablet by mouth every 6 (six) hours as needed for severe pain. 11/03/15  Yes Sable Feil, PA-C  rizatriptan (MAXALT-MLT) 10 MG disintegrating tablet TAKE 1 TABLET BY MOUTH AS NEEDED FOR MIGRAINE. MAY REPEAT IN 2 HOURS IF NEEDED 01/01/16  Yes Glean Hess, MD    Allergies  Allergen Reactions  . Morphine And Related Hives, Shortness Of Breath and Nausea And Vomiting    "can't breathe"  . Penicillins Hives, Shortness Of Breath and Nausea And Vomiting    "can't breathe"  . Latex Hives    Past Surgical History  Procedure Laterality Date  . C sections    . Tonsillectomy    . Gastric bypass  2005  . Abdominal hysterectomy  10/2006    cervix and ovaries remain  . Breast reduction surgery    . Reduction mammaplasty Bilateral     2001  . Back surgery  2014    lumbar fusion  . Back surgery  2009    discectomy    Social History  Substance Use Topics  . Smoking status: Never Smoker   . Smokeless tobacco: None  . Alcohol Use: No     Medication list has been reviewed and updated.   Physical Exam  Constitutional: She appears well-developed. She appears distressed.  Eyes: EOM are normal. Pupils are equal, round, and reactive to light.    Lymphadenopathy:    She has no cervical adenopathy.  Skin: Skin is warm and dry. No rash noted. No erythema.  Nursing note and vitals reviewed.   BP 115/82 mmHg  Pulse 84  Temp(Src) 98.5 F (36.9 C) (Oral)  Resp 16  Ht 5\' 7"  (1.702 m)  Wt 324 lb (146.965 kg)  BMI 50.73 kg/m2  SpO2 97%  Assessment and  Plan: 1. Hordeolum external, right Continue warm compresses Antibiotics and topical ointment until seen by Ophthalmology - sulfamethoxazole-trimethoprim (BACTRIM DS,SEPTRA DS) 800-160 MG tablet; Take 1 tablet by mouth 2 (two) times daily.  Dispense: 20 tablet; Refill: 0 - erythromycin ophthalmic ointment; Place 1 application into the right eye at bedtime. Place 1/2 inch into right lower inner lid at bedtime  Dispense: 3.5 g; Refill: 0 - Ambulatory referral to Ophthalmology   Halina Maidens, MD [facility]  03/13/2016

## 2016-03-13 NOTE — Patient Instructions (Signed)
Stye A stye is a bump on your eyelid caused by a bacterial infection. A stye can form inside the eyelid (internal stye) or outside the eyelid (external stye). An internal stye may be caused by an infected oil-producing gland inside your eyelid. An external stye may be caused by an infection at the base of your eyelash (hair follicle). Styes are very common. Anyone can get them at any age. They usually occur in just one eye, but you may have more than one in either eye.  CAUSES  The infection is almost always caused by bacteria called Staphylococcus aureus. This is a common type of bacteria that lives on your skin. RISK FACTORS You may be at higher risk for a stye if you have had one before. You may also be at higher risk if you have:  Diabetes.  Long-term illness.  Long-term eye redness.  A skin condition called seborrhea.  High fat levels in your blood (lipids). SIGNS AND SYMPTOMS  Eyelid pain is the most common symptom of a stye. Internal styes are more painful than external styes. Other signs and symptoms may include:  Painful swelling of your eyelid.  A scratchy feeling in your eye.  Tearing and redness of your eye.  Pus draining from the stye. DIAGNOSIS  Your health care provider may be able to diagnose a stye just by examining your eye. The health care provider may also check to make sure:  You do not have a fever or other signs of a more serious infection.  The infection has not spread to other parts of your eye or areas around your eye. TREATMENT  Most styes will clear up in a few days without treatment. In some cases, you may need to use antibiotic drops or ointment to prevent infection. Your health care provider may have to drain the stye surgically if your stye is:  Large.  Causing a lot of pain.  Interfering with your vision. This can be done using a thin blade or a needle.  HOME CARE INSTRUCTIONS   Take medicines only as directed by your health care  provider.  Apply a clean, warm compress to your eye for 10 minutes, 4 times a day.  Do not wear contact lenses or eye makeup until your stye has healed.  Do not try to pop or drain the stye. SEEK MEDICAL CARE IF:  You have chills or a fever.  Your stye does not go away after several days.  Your stye affects your vision.  Your eyeball becomes swollen, red, or painful. MAKE SURE YOU:  Understand these instructions.  Will watch your condition.  Will get help right away if you are not doing well or get worse.   This information is not intended to replace advice given to you by your health care provider. Make sure you discuss any questions you have with your health care provider.   Document Released: 06/24/2005 Document Revised: 10/05/2014 Document Reviewed: 12/29/2013 Elsevier Interactive Patient Education 2016 [employer]  

## 2016-03-16 ENCOUNTER — Other Ambulatory Visit: Payer: Self-pay | Admitting: Neurological Surgery

## 2016-03-19 ENCOUNTER — Encounter (HOSPITAL_COMMUNITY): Payer: Self-pay | Admitting: *Deleted

## 2016-03-19 MED ORDER — VANCOMYCIN HCL 10 G IV SOLR
1500.0000 mg | INTRAVENOUS | Status: AC
  Administered 2016-03-20: 1500 mg via INTRAVENOUS
  Filled 2016-03-19: qty 1500

## 2016-03-19 MED ORDER — DEXAMETHASONE SODIUM PHOSPHATE 10 MG/ML IJ SOLN
10.0000 mg | INTRAMUSCULAR | Status: AC
  Administered 2016-03-20: 10 mg via INTRAVENOUS
  Filled 2016-03-19: qty 1

## 2016-03-19 NOTE — Progress Notes (Signed)
Pt denies cardiac history, chest pain or sob. 

## 2016-03-20 ENCOUNTER — Other Ambulatory Visit: Payer: Self-pay

## 2016-03-20 ENCOUNTER — Encounter (HOSPITAL_COMMUNITY): Payer: Self-pay | Admitting: Neurological Surgery

## 2016-03-20 ENCOUNTER — Ambulatory Visit (HOSPITAL_COMMUNITY): Payer: Commercial Managed Care - HMO | Admitting: Anesthesiology

## 2016-03-20 ENCOUNTER — Ambulatory Visit (HOSPITAL_COMMUNITY): Payer: Commercial Managed Care - HMO

## 2016-03-20 ENCOUNTER — Encounter (HOSPITAL_COMMUNITY)
Admission: RE | Disposition: A | Discharge status: Home / Self Care | Payer: Self-pay | Source: Ambulatory Visit | Attending: Neurological Surgery

## 2016-03-20 ENCOUNTER — Ambulatory Visit (HOSPITAL_COMMUNITY)
Admission: RE | Admit: 2016-03-20 | Discharge: 2016-03-21 | Disposition: A | Discharge status: Home / Self Care | Payer: Commercial Managed Care - HMO | Source: Ambulatory Visit | Attending: Neurological Surgery | Admitting: Neurological Surgery

## 2016-03-20 DIAGNOSIS — M549 Dorsalgia, unspecified: Secondary | ICD-10-CM | POA: Diagnosis present

## 2016-03-20 DIAGNOSIS — Z01812 Encounter for preprocedural laboratory examination: Secondary | ICD-10-CM | POA: Insufficient documentation

## 2016-03-20 DIAGNOSIS — Z880 Allergy status to penicillin: Secondary | ICD-10-CM | POA: Diagnosis not present

## 2016-03-20 DIAGNOSIS — G4730 Sleep apnea, unspecified: Secondary | ICD-10-CM | POA: Diagnosis not present

## 2016-03-20 DIAGNOSIS — M545 Low back pain: Secondary | ICD-10-CM | POA: Insufficient documentation

## 2016-03-20 DIAGNOSIS — M961 Postlaminectomy syndrome, not elsewhere classified: Secondary | ICD-10-CM | POA: Diagnosis not present

## 2016-03-20 DIAGNOSIS — M48062 Spinal stenosis, lumbar region with neurogenic claudication: Secondary | ICD-10-CM

## 2016-03-20 DIAGNOSIS — G8929 Other chronic pain: Secondary | ICD-10-CM | POA: Insufficient documentation

## 2016-03-20 DIAGNOSIS — Z6841 Body Mass Index (BMI) 40.0 and over, adult: Secondary | ICD-10-CM | POA: Diagnosis not present

## 2016-03-20 DIAGNOSIS — F418 Other specified anxiety disorders: Secondary | ICD-10-CM | POA: Diagnosis not present

## 2016-03-20 DIAGNOSIS — Z462 Encounter for fitting and adjustment of other devices related to nervous system and special senses: Secondary | ICD-10-CM | POA: Diagnosis not present

## 2016-03-20 DIAGNOSIS — M1990 Unspecified osteoarthritis, unspecified site: Secondary | ICD-10-CM | POA: Insufficient documentation

## 2016-03-20 DIAGNOSIS — Z419 Encounter for procedure for purposes other than remedying health state, unspecified: Secondary | ICD-10-CM

## 2016-03-20 HISTORY — PX: SPINAL CORD STIMULATOR INSERTION: SHX5378

## 2016-03-20 HISTORY — DX: Pneumonia, unspecified organism: J18.9

## 2016-03-20 LAB — BASIC METABOLIC PANEL
ANION GAP: 7 (ref 5–15)
BUN: <5 mg/dL — ABNORMAL LOW (ref 6–20)
CHLORIDE: 108 mmol/L (ref 101–111)
CO2: 21 mmol/L — AB (ref 22–32)
CREATININE: 0.63 mg/dL (ref 0.44–1.00)
Calcium: 9 mg/dL (ref 8.9–10.3)
GFR calc non Af Amer: >60 mL/min (ref >60)
Glucose, Bld: 84 mg/dL (ref 65–99)
POTASSIUM: 3.8 mmol/L (ref 3.5–5.1)
SODIUM: 136 mmol/L (ref 135–145)

## 2016-03-20 LAB — CBC WITH DIFFERENTIAL/PLATELET
BASOS PCT: 0 %
Basophils Absolute: 0 10*3/uL (ref 0.0–0.1)
EOS ABS: 0.1 10*3/uL (ref 0.0–0.7)
EOS PCT: 2 %
HCT: 35.6 % — ABNORMAL LOW (ref 36.0–46.0)
Hemoglobin: 10.8 g/dL — ABNORMAL LOW (ref 12.0–15.0)
LYMPHS ABS: 2.6 10*3/uL (ref 0.7–4.0)
Lymphocytes Relative: 50 %
MCH: 25.5 pg — AB (ref 26.0–34.0)
MCHC: 30.3 g/dL (ref 30.0–36.0)
MCV: 84 fL (ref 78.0–100.0)
MONOS PCT: 7 %
Monocytes Absolute: 0.4 10*3/uL (ref 0.1–1.0)
Neutro Abs: 2.1 10*3/uL (ref 1.7–7.7)
Neutrophils Relative %: 41 %
PLATELETS: 250 10*3/uL (ref 150–400)
RBC: 4.24 MIL/uL (ref 3.87–5.11)
RDW: 15.4 % (ref 11.5–15.5)
WBC: 5.2 10*3/uL (ref 4.0–10.5)

## 2016-03-20 LAB — SURGICAL PCR SCREEN
MRSA, PCR: NEGATIVE
Staphylococcus aureus: NEGATIVE

## 2016-03-20 LAB — PROTIME-INR
INR: 1.05 (ref 0.00–1.49)
PROTHROMBIN TIME: 13.9 s (ref 11.6–15.2)

## 2016-03-20 SURGERY — INSERTION, SPINAL CORD STIMULATOR, LUMBAR
Anesthesia: General | Site: Back

## 2016-03-20 MED ORDER — LIDOCAINE 2% (20 MG/ML) 5 ML SYRINGE: INTRAMUSCULAR | Status: DC | PRN

## 2016-03-20 MED ORDER — ONDANSETRON HCL 4 MG/2ML IJ SOLN
INTRAMUSCULAR | Status: DC | PRN
  Administered 2016-03-20: 4 mg via INTRAVENOUS

## 2016-03-20 MED ORDER — VANCOMYCIN HCL 1000 MG IV SOLR
INTRAVENOUS | Status: AC
  Filled 2016-03-20: qty 1000

## 2016-03-20 MED ORDER — VANCOMYCIN HCL 10 G IV SOLR
1500.0000 mg | Freq: Once | INTRAVENOUS | Status: AC
  Administered 2016-03-21: 1500 mg via INTRAVENOUS
  Filled 2016-03-20: qty 1500

## 2016-03-20 MED ORDER — ROCURONIUM BROMIDE 50 MG/5ML IV SOLN
INTRAVENOUS | Status: AC
  Filled 2016-03-20: qty 1

## 2016-03-20 MED ORDER — OXYCODONE-ACETAMINOPHEN 5-325 MG PO TABS
1.0000 | ORAL_TABLET | ORAL | Status: DC | PRN
  Administered 2016-03-20 – 2016-03-21 (×4): 2 via ORAL
  Filled 2016-03-20 (×4): qty 2

## 2016-03-20 MED ORDER — FENTANYL CITRATE (PF) 250 MCG/5ML IJ SOLN
INTRAMUSCULAR | Status: AC
  Filled 2016-03-20: qty 5

## 2016-03-20 MED ORDER — HEMOSTATIC AGENTS (NO CHARGE) OPTIME
TOPICAL | Status: DC | PRN
  Administered 2016-03-20: 1 via TOPICAL

## 2016-03-20 MED ORDER — HYDROMORPHONE HCL 1 MG/ML IJ SOLN: 0.5000 mg | INTRAMUSCULAR | Status: DC | PRN

## 2016-03-20 MED ORDER — GABAPENTIN 300 MG PO CAPS
600.0000 mg | ORAL_CAPSULE | Freq: Every day | ORAL | Status: DC
  Administered 2016-03-20: 600 mg via ORAL
  Filled 2016-03-20: qty 2

## 2016-03-20 MED ORDER — PROPOFOL 10 MG/ML IV BOLUS
INTRAVENOUS | Status: AC
  Filled 2016-03-20: qty 20

## 2016-03-20 MED ORDER — ONDANSETRON HCL 4 MG/2ML IJ SOLN
INTRAMUSCULAR | Status: AC
  Filled 2016-03-20: qty 2

## 2016-03-20 MED ORDER — FENTANYL CITRATE (PF) 100 MCG/2ML IJ SOLN
25.0000 ug | INTRAMUSCULAR | Status: DC | PRN
  Administered 2016-03-20 (×2): 50 ug via INTRAVENOUS

## 2016-03-20 MED ORDER — SODIUM CHLORIDE 0.9% FLUSH: 3.0000 mL | Freq: Two times a day (BID) | INTRAVENOUS | Status: DC

## 2016-03-20 MED ORDER — SUGAMMADEX SODIUM 200 MG/2ML IV SOLN
INTRAVENOUS | Status: AC
  Filled 2016-03-20: qty 2

## 2016-03-20 MED ORDER — FENTANYL CITRATE (PF) 100 MCG/2ML IJ SOLN
INTRAMUSCULAR | Status: AC
  Filled 2016-03-20: qty 2

## 2016-03-20 MED ORDER — MIDAZOLAM HCL 2 MG/2ML IJ SOLN
INTRAMUSCULAR | Status: AC
  Filled 2016-03-20: qty 2

## 2016-03-20 MED ORDER — MUPIROCIN 2 % EX OINT
TOPICAL_OINTMENT | CUTANEOUS | Status: AC
  Filled 2016-03-20: qty 22

## 2016-03-20 MED ORDER — DULOXETINE HCL 30 MG PO CPEP
30.0000 mg | ORAL_CAPSULE | Freq: Two times a day (BID) | ORAL | Status: DC
  Administered 2016-03-20 – 2016-03-21 (×2): 30 mg via ORAL
  Filled 2016-03-20 (×2): qty 1

## 2016-03-20 MED ORDER — ERYTHROMYCIN 5 MG/GM OP OINT
1.0000 "application " | TOPICAL_OINTMENT | Freq: Every day | OPHTHALMIC | Status: DC
  Filled 2016-03-20: qty 3.5

## 2016-03-20 MED ORDER — PHENOL 1.4 % MT LIQD: 1.0000 | OROMUCOSAL | Status: DC | PRN

## 2016-03-20 MED ORDER — THROMBIN 5000 UNITS EX SOLR: CUTANEOUS | Status: DC | PRN

## 2016-03-20 MED ORDER — PROPOFOL 10 MG/ML IV BOLUS
INTRAVENOUS | Status: DC | PRN
  Administered 2016-03-20: 200 mg via INTRAVENOUS

## 2016-03-20 MED ORDER — LIDOCAINE 2% (20 MG/ML) 5 ML SYRINGE
INTRAMUSCULAR | Status: DC | PRN
  Administered 2016-03-20: 100 mg via INTRAVENOUS

## 2016-03-20 MED ORDER — THROMBIN 5000 UNITS EX SOLR
CUTANEOUS | Status: DC | PRN
  Administered 2016-03-20: 5 mL via TOPICAL

## 2016-03-20 MED ORDER — SUCCINYLCHOLINE CHLORIDE 200 MG/10ML IV SOSY
PREFILLED_SYRINGE | INTRAVENOUS | Status: DC | PRN
  Administered 2016-03-20: 160 mg via INTRAVENOUS

## 2016-03-20 MED ORDER — LIDOCAINE 2% (20 MG/ML) 5 ML SYRINGE
INTRAMUSCULAR | Status: AC
  Filled 2016-03-20: qty 5

## 2016-03-20 MED ORDER — LABETALOL HCL 5 MG/ML IV SOLN
INTRAVENOUS | Status: DC | PRN
  Administered 2016-03-20 (×2): 5 mg via INTRAVENOUS

## 2016-03-20 MED ORDER — LACTATED RINGERS IV SOLN
INTRAVENOUS | Status: DC
  Administered 2016-03-20 (×2): via INTRAVENOUS

## 2016-03-20 MED ORDER — SULFAMETHOXAZOLE-TRIMETHOPRIM 800-160 MG PO TABS
1.0000 | ORAL_TABLET | Freq: Two times a day (BID) | ORAL | Status: DC
  Administered 2016-03-20 – 2016-03-21 (×2): 1 via ORAL
  Filled 2016-03-20 (×2): qty 1

## 2016-03-20 MED ORDER — VANCOMYCIN HCL 1000 MG IV SOLR
INTRAVENOUS | Status: DC | PRN
Start: 2016-03-20 — End: 2016-03-20
  Administered 2016-03-20: 1000 mg

## 2016-03-20 MED ORDER — SODIUM CHLORIDE 0.9% FLUSH: 3.0000 mL | INTRAVENOUS | Status: DC | PRN

## 2016-03-20 MED ORDER — ARTIFICIAL TEARS OP OINT
TOPICAL_OINTMENT | OPHTHALMIC | Status: DC | PRN
  Administered 2016-03-20: 1 via OPHTHALMIC

## 2016-03-20 MED ORDER — THROMBIN 5000 UNITS EX SOLR
CUTANEOUS | Status: DC | PRN
  Administered 2016-03-20 (×2): 5000 [IU] via TOPICAL

## 2016-03-20 MED ORDER — DEXAMETHASONE SODIUM PHOSPHATE 10 MG/ML IJ SOLN
INTRAMUSCULAR | Status: AC
  Filled 2016-03-20: qty 1

## 2016-03-20 MED ORDER — SODIUM CHLORIDE 0.9 % IR SOLN
Status: DC | PRN
  Administered 2016-03-20: 500 mL

## 2016-03-20 MED ORDER — MENTHOL 3 MG MT LOZG: 1.0000 | LOZENGE | OROMUCOSAL | Status: DC | PRN

## 2016-03-20 MED ORDER — ONDANSETRON HCL 4 MG/2ML IJ SOLN: 4.0000 mg | INTRAMUSCULAR | Status: DC | PRN

## 2016-03-20 MED ORDER — ACETAMINOPHEN 650 MG RE SUPP: 650.0000 mg | RECTAL | Status: DC | PRN

## 2016-03-20 MED ORDER — SODIUM CHLORIDE 0.9 % IV SOLN: 250.0000 mL | INTRAVENOUS | Status: DC

## 2016-03-20 MED ORDER — SUGAMMADEX SODIUM 200 MG/2ML IV SOLN
INTRAVENOUS | Status: DC | PRN
  Administered 2016-03-20: 588 mg via INTRAVENOUS

## 2016-03-20 MED ORDER — PROMETHAZINE HCL 25 MG/ML IJ SOLN: 6.2500 mg | INTRAMUSCULAR | Status: DC | PRN

## 2016-03-20 MED ORDER — ACETAMINOPHEN 325 MG PO TABS: 650.0000 mg | ORAL_TABLET | ORAL | Status: DC | PRN

## 2016-03-20 MED ORDER — MUPIROCIN 2 % EX OINT
1.0000 "application " | TOPICAL_OINTMENT | Freq: Once | CUTANEOUS | Status: AC
  Administered 2016-03-20: 1 via TOPICAL

## 2016-03-20 MED ORDER — POTASSIUM CHLORIDE IN NACL 20-0.9 MEQ/L-% IV SOLN
INTRAVENOUS | Status: DC
  Filled 2016-03-20 (×3): qty 1000

## 2016-03-20 MED ORDER — ROCURONIUM BROMIDE 100 MG/10ML IV SOLN
INTRAVENOUS | Status: DC | PRN
  Administered 2016-03-20: 50 mg via INTRAVENOUS
  Administered 2016-03-20 (×2): 20 mg via INTRAVENOUS

## 2016-03-20 MED ORDER — MIDAZOLAM HCL 2 MG/2ML IJ SOLN
INTRAMUSCULAR | Status: DC | PRN
  Administered 2016-03-20: 2 mg via INTRAVENOUS

## 2016-03-20 MED ORDER — SUGAMMADEX SODIUM 200 MG/2ML IV SOLN
INTRAVENOUS | Status: AC
  Filled 2016-03-20: qty 4

## 2016-03-20 MED ORDER — FENTANYL CITRATE (PF) 250 MCG/5ML IJ SOLN
INTRAMUSCULAR | Status: DC | PRN
  Administered 2016-03-20 (×4): 50 ug via INTRAVENOUS
  Administered 2016-03-20: 150 ug via INTRAVENOUS

## 2016-03-20 SURGICAL SUPPLY — 57 items
ADH SKN CLS APL DERMABOND .7 (GAUZE/BANDAGES/DRESSINGS) ×2
APL SKNCLS STERI-STRIP NONHPOA (GAUZE/BANDAGES/DRESSINGS) ×1
BAG DECANTER FOR FLEXI CONT (MISCELLANEOUS) ×2 IMPLANT
BENZOIN TINCTURE PRP APPL 2/3 (GAUZE/BANDAGES/DRESSINGS) ×2 IMPLANT
BUR MATCHSTICK NEURO 3.0 LAGG (BURR) ×2 IMPLANT
CANISTER SUCT 3000ML PPV (MISCELLANEOUS) ×2 IMPLANT
DERMABOND ADVANCED (GAUZE/BANDAGES/DRESSINGS) ×2
DERMABOND ADVANCED .7 DNX12 (GAUZE/BANDAGES/DRESSINGS) IMPLANT
DRAPE C-ARM 42X72 X-RAY (DRAPES) ×4 IMPLANT
DRAPE LAPAROTOMY 100X72X124 (DRAPES) ×2 IMPLANT
DRAPE POUCH INSTRU U-SHP 10X18 (DRAPES) ×2 IMPLANT
DRAPE SURG 17X23 STRL (DRAPES) ×2 IMPLANT
DRSG OPSITE POSTOP 3X4 (GAUZE/BANDAGES/DRESSINGS) ×1 IMPLANT
DRSG OPSITE POSTOP 4X6 (GAUZE/BANDAGES/DRESSINGS) ×1 IMPLANT
DURAPREP 26ML APPLICATOR (WOUND CARE) ×2 IMPLANT
ELECT REM PT RETURN 9FT ADLT (ELECTROSURGICAL) ×2
ELECTRODE REM PT RTRN 9FT ADLT (ELECTROSURGICAL) ×1 IMPLANT
ELEVATER PASSER (SPINAL CORD STIMULATOR) ×2
GAUZE SPONGE 4X4 16PLY XRAY LF (GAUZE/BANDAGES/DRESSINGS) IMPLANT
GLOVE BIO SURGEON STRL SZ8 (GLOVE) ×2 IMPLANT
GOWN STRL REUS W/ TWL LRG LVL3 (GOWN DISPOSABLE) IMPLANT
GOWN STRL REUS W/ TWL XL LVL3 (GOWN DISPOSABLE) ×1 IMPLANT
GOWN STRL REUS W/TWL 2XL LVL3 (GOWN DISPOSABLE) IMPLANT
GOWN STRL REUS W/TWL LRG LVL3 (GOWN DISPOSABLE)
GOWN STRL REUS W/TWL XL LVL3 (GOWN DISPOSABLE) ×2
HEMOSTAT POWDER KIT SURGIFOAM (HEMOSTASIS) ×1 IMPLANT
IPG PRECISION SPECTRA (Stimulator) ×1 IMPLANT
KIT BASIN OR (CUSTOM PROCEDURE TRAY) ×2 IMPLANT
KIT CHARGING (KITS) ×1
KIT CHARGING PRECISION NEURO (KITS) IMPLANT
KIT PAT PROGRAM FREELINK (KITS) IMPLANT
KIT ROOM TURNOVER OR (KITS) ×2 IMPLANT
LEAD COVER EDGE 50CM STIM KIT (Lead) ×1 IMPLANT
NDL HYPO 25X1 1.5 SAFETY (NEEDLE) ×1 IMPLANT
NDL SPNL 20GX3.5 QUINCKE YW (NEEDLE) IMPLANT
NEEDLE HYPO 25X1 1.5 SAFETY (NEEDLE) ×2 IMPLANT
NEEDLE SPNL 20GX3.5 QUINCKE YW (NEEDLE) IMPLANT
NS IRRIG 1000ML POUR BTL (IV SOLUTION) ×2 IMPLANT
PACK LAMINECTOMY NEURO (CUSTOM PROCEDURE TRAY) ×2 IMPLANT
PAD ARMBOARD 7.5X6 YLW CONV (MISCELLANEOUS) ×6 IMPLANT
PADDLE BLANK SIMULATOR LEAD 32 (MISCELLANEOUS) ×1 IMPLANT
PASSER ELEVATOR (SPINAL CORD STIMULATOR) IMPLANT
REMOTE CONTROL KIT (KITS) ×2
RUBBERBAND STERILE (MISCELLANEOUS) ×4 IMPLANT
SPONGE SURGIFOAM ABS GEL SZ50 (HEMOSTASIS) ×2 IMPLANT
STRIP CLOSURE SKIN 1/2X4 (GAUZE/BANDAGES/DRESSINGS) ×2 IMPLANT
SUT SILK 2 0 FS (SUTURE) ×2 IMPLANT
SUT SILK 2 0 TIES 10X30 (SUTURE) ×1 IMPLANT
SUT VIC AB 0 CT1 18XCR BRD8 (SUTURE) ×1 IMPLANT
SUT VIC AB 0 CT1 8-18 (SUTURE) ×6
SUT VIC AB 2-0 CP2 18 (SUTURE) ×4 IMPLANT
SUT VIC AB 3-0 SH 8-18 (SUTURE) ×4 IMPLANT
TAPE STRIPS DRAPE STRL (GAUZE/BANDAGES/DRESSINGS) ×2 IMPLANT
TOOL LONG TUNNEL (SPINAL CORD STIMULATOR) ×1 IMPLANT
TOWEL OR 17X24 6PK STRL BLUE (TOWEL DISPOSABLE) ×2 IMPLANT
TOWEL OR 17X26 10 PK STRL BLUE (TOWEL DISPOSABLE) ×2 IMPLANT
WATER STERILE IRR 1000ML POUR (IV SOLUTION) ×2 IMPLANT

## 2016-03-20 NOTE — Op Note (Signed)
03/20/2016  5:34 PM  PATIENT:  Kimberly Cowan  50 y.o. female  PRE-OPERATIVE DIAGNOSIS:  Chronic back pain  POST-OPERATIVE DIAGNOSIS:  Same  PROCEDURE:  Permanent spinal cord stimulator placement with [place] for lead paddle requiring thoracic laminectomy and placement of permanent battery  SURGEON:  Sherley Bounds, MD  ASSISTANTS: None  ANESTHESIA:   General  EBL: Less than 50 ml  Total I/O In: 700 [I.V.:700] Out: 100 [Blood:100]  BLOOD ADMINISTERED:none  DRAINS: None   SPECIMEN:  No Specimen  INDICATION FOR PROCEDURE: This patient underwent a previous lumbar fusion. She had chronic back pain but responded well to a spinal cord still relate her trial. We recommended permanent spinal cord stimulator placement. Patient understood the risks, benefits, and alternatives and potential outcomes and wished to proceed.  PROCEDURE DETAILS: The patient was taken to the operating room and after induction of adequate generalized tracheal anesthesia the patient was rolled onto the [place] frame in the prone position. All pressure points were padded. The thoracic and lumbar region was cleaned and then prepped in the usual sterile fashion utilizing Betadine scrub and DuraPrep. Local anesthesia was injected and a dorsal midline incision was made over the T9-10 region and carried down to the thoracic fascia. The fascia was opened and the paraspinous musculature was taken down in a subcutaneous periosteal fashion to expose T9 10. Intraoperative fluoroscopy confirmed my level and then the spinous process was removed and a laminectomy was performed at T 9-10 to expose the underlying dura. I then was able to pass our trial easily through the epidural space to the level of T8. This was done and checked with AP fluoroscopy. I then was able to pass my paddle lead to the same level utilizing AP fluoroscopy. The leads were then tacked down to fascia. An incision was made in the right flank  and a pocket was created for the battery. I then used a passer to pass the leads to the battery pocket. The leads were then placed into the battery and impedance was checked after placing the battery partially in the pocket. The leads were then tightened into position. The battery was then placed in the pocket with the leads behind the battery. We checked our final placement with AP fluoroscopy. I was then able to irrigate with copious amounts of bacitracin containing saline solution. I then closed the fascia of both incisions with 0 Vicryl. The subcutaneous tissues were closed with 2-0 Vicryl in the subcuticular tissue was closed with 3-0 Vicryl. The skin was closed with Dermabond and then benzoin and Steri-Strips. A sterile dressing was then applied. The patient was then awakened from general anesthesia and transported to the recovery room in stable condition. At the end of the procedure all sponge needle and instrument counts were correct.   PLAN OF CARE: Admit for overnight observation  PATIENT DISPOSITION:  PACU - hemodynamically stable.   Delay start of Pharmacological VTE agent (>24hrs) due to surgical blood loss or risk of bleeding:  yes

## 2016-03-20 NOTE — H&P (Signed)
Subjective: Patient is a 50 y.o. female admitted for chronic back pain. Onset of symptoms was a few years ago, gradually worsening since that time.  The pain is rated severe, and is located at the across the lower back and radiates to her legs. The pain is described as aching and occurs all day. The symptoms have been progressive. Symptoms are exacerbated by nothing in particular. She did very well with a spinal cord stimulator trial. She had a significant reduction in pain. He presents today for permanent spinal cord stimulator placement.   Past Medical History  Diagnosis Date  . Sleep apnea     no cpcap     last sleep study > 4 yrs  . Anxiety   . Depression   . Clinical depression 04/04/2012  . Chronic back pain   . Pneumonia     walking pneumonia  . Headache(784.0)     migraines  . Arthritis     lower spine    Past Surgical History  Procedure Laterality Date  . C sections    . Tonsillectomy    . Gastric bypass  2005  . Abdominal hysterectomy  10/2006    cervix and ovaries remain  . Breast reduction surgery    . Reduction mammaplasty Bilateral     2001  . Back surgery  2014    lumbar fusion  . Back surgery  2009    discectomy    Prior to Admission medications   Medication Sig Start Date End Date Taking? Authorizing Provider  Cetirizine HCl 10 MG CAPS Take 1 capsule (10 mg total) by mouth daily. 01/16/16  Yes Cari B Triplett, FNP  DULoxetine (CYMBALTA) 30 MG capsule Take 30 mg by mouth 2 (two) times daily.  02/05/16  Yes Historical Provider, MD  erythromycin ophthalmic ointment Place 1 application into the right eye at bedtime. Place 1/2 inch into right lower inner lid at bedtime 03/13/16  Yes Glean Hess, MD  gabapentin (NEURONTIN) 300 MG capsule Take 600 mg by mouth at bedtime.  01/01/16  Yes Historical Provider, MD  oxycodone-acetaminophen (PERCOCET) 2.5-325 MG tablet Take 1 tablet by mouth every 6 (six) hours as needed for pain. Not after 1800 because of sleep apnea   Yes  Historical Provider, MD  rizatriptan (MAXALT-MLT) 10 MG disintegrating tablet TAKE 1 TABLET BY MOUTH AS NEEDED FOR MIGRAINE. MAY REPEAT IN 2 HOURS IF NEEDED 01/01/16  Yes Glean Hess, MD  sulfamethoxazole-trimethoprim (BACTRIM DS,SEPTRA DS) 800-160 MG tablet Take 1 tablet by mouth 2 (two) times daily. Patient taking differently: Take 1 tablet by mouth 2 (two) times daily. For 10 days ending 03-23-16 03/13/16  Yes Glean Hess, MD   Allergies  Allergen Reactions  . Morphine And Related Hives, Shortness Of Breath, Nausea And Vomiting and Other (See Comments)    "can't breathe"  . Penicillins Anaphylaxis, Hives, Shortness Of Breath and Nausea And Vomiting    "can't breathe", Has patient had a PCN reaction causing immediate rash, facial/tongue/throat swelling, SOB or lightheadedness with hypotension: Yes Has patient had a PCN reaction causing severe rash involving mucus membranes or skin necrosis: No Has patient had a PCN reaction that required hospitalization No Has patient had a PCN reaction occurring within the last 10 years: No If all of the above answers are "NO", then may proceed with Cephalosporin use.   . Latex Hives  . Baclofen Nausea Only    Jittery, anxious    Social History  Substance Use Topics  .  Smoking status: Never Smoker   . Smokeless tobacco: Never Used  . Alcohol Use: No    Family History  Problem Relation Age of Onset  . Diabetes Mother   . Hypertension Mother   . Diabetes Sister   . Hypertension Father   . Hypertension Sister   . Breast cancer Neg Hx      Review of Systems  Positive ROS: neg  All other systems have been reviewed and were otherwise negative with the exception of those mentioned in the HPI and as above.  Objective: Vital signs in last 24 hours:    General Appearance: Alert, cooperative, no distress, appears stated age Head: Normocephalic, without obvious abnormality, atraumatic Eyes: PERRL, conjunctiva/corneas clear, EOM's intact     Neck: Supple, symmetrical, trachea midline Back: Symmetric, no curvature, ROM normal, no CVA tenderness Lungs:  respirations unlabored Heart: Regular rate and rhythm Abdomen: Soft, non-tender Extremities: Extremities normal, atraumatic, no cyanosis or edema Pulses: 2+ and symmetric all extremities Skin: Skin color, texture, turgor normal, no rashes or lesions  NEUROLOGIC:   Mental status: Alert and oriented x4,  no aphasia, good attention span, fund of knowledge, and memory Motor Exam - grossly normal Sensory Exam - grossly normal Reflexes: Trace Coordination - grossly normal Gait - grossly normal Balance - grossly normal Cranial Nerves: I: smell Not tested  II: visual acuity  OS: nl    OD: nl  II: visual fields Full to confrontation  II: pupils Equal, round, reactive to light  III,VII: ptosis None  III,IV,VI: extraocular muscles  Full ROM  V: mastication Normal  V: facial light touch sensation  Normal  V,VII: corneal reflex  Present  VII: facial muscle function - upper  Normal  VII: facial muscle function - lower Normal  VIII: hearing Not tested  IX: soft palate elevation  Normal  IX,X: gag reflex Present  XI: trapezius strength  5/5  XI: sternocleidomastoid strength 5/5  XI: neck flexion strength  5/5  XII: tongue strength  Normal    Data Review Lab Results  Component Value Date   WBC 4.4 07/04/2015   HGB 12.9 10/31/2014   HCT 35.8 07/04/2015   MCV 83 07/04/2015   PLT 288 07/04/2015   Lab Results  Component Value Date   NA 140 07/04/2015   K 3.9 07/04/2015   CL 107 07/04/2015   CO2 26 10/31/2014   BUN 8 10/31/2014   CREATININE 0.69 10/31/2014   GLUCOSE 93 10/31/2014   No results found for: INR, PROTIME  Assessment/Plan: Patient admitted for Permanent spinal cord stimulator placement. Patient has failed a reasonable attempt at conservative therapy.  I explained the condition and procedure to the patient and answered any questions.  Patient wishes to  proceed with procedure as planned. Understands risks/ benefits and typical outcomes of procedure.   Shailee Foots S 03/20/2016 12:01 PM

## 2016-03-20 NOTE — Anesthesia Preprocedure Evaluation (Addendum)
Anesthesia Evaluation  Patient identified by MRN, date of birth, ID band Patient awake    Reviewed: Allergy & Precautions, NPO status , Patient's Chart, lab work & pertinent test results  Airway Mallampati: II  TM Distance: >3 FB Neck ROM: Full    Dental no notable dental hx.    Pulmonary sleep apnea , pneumonia, resolved,    Pulmonary exam normal breath sounds clear to auscultation       Cardiovascular negative cardio ROS Normal cardiovascular exam Rhythm:Regular Rate:Normal     Neuro/Psych  Headaches, PSYCHIATRIC DISORDERS Anxiety Depression  Neuromuscular disease    GI/Hepatic negative GI ROS, Neg liver ROS,   Endo/Other  Morbid obesity  Renal/GU negative Renal ROS  negative genitourinary   Musculoskeletal  (+) Arthritis ,   Abdominal (+) + obese,   Peds negative pediatric ROS (+)  Hematology negative hematology ROS (+)   Anesthesia Other Findings   Reproductive/Obstetrics negative OB ROS                             Anesthesia Physical Anesthesia Plan  ASA: III  Anesthesia Plan: General   Post-op Pain Management:    Induction: Intravenous  Airway Management Planned: Oral ETT  Additional Equipment:   Intra-op Plan:   Post-operative Plan: Extubation in OR  Informed Consent: I have reviewed the patients History and Physical, chart, labs and discussed the procedure including the risks, benefits and alternatives for the proposed anesthesia with the patient or authorized representative who has indicated his/her understanding and acceptance.   Dental advisory given  Plan Discussed with: CRNA  Anesthesia Plan Comments: (MAC 4 times one; grade 2 view   (2014))       Anesthesia Quick Evaluation

## 2016-03-20 NOTE — Anesthesia Procedure Notes (Signed)
Procedure Name: Intubation Date/Time: 03/20/2016 3:31 PM Performed by: Trixie Deis A Pre-anesthesia Checklist: Timeout performed, Patient identified, Emergency Drugs available, Patient being monitored and Suction available Patient Re-evaluated:Patient Re-evaluated prior to inductionOxygen Delivery Method: Circle system utilized Preoxygenation: Pre-oxygenation with 100% oxygen Intubation Type: IV induction Ventilation: Mask ventilation without difficulty and Oral airway inserted - appropriate to patient size Laryngoscope Size: Mac and 3 Grade View: Grade II Tube type: Oral Tube size: 7.0 mm Number of attempts: 1 Airway Equipment and Method: Stylet Placement Confirmation: ETT inserted through vocal cords under direct vision,  breath sounds checked- equal and bilateral and positive ETCO2 Secured at: 21 cm Tube secured with: Tape Dental Injury: Teeth and Oropharynx as per pre-operative assessment

## 2016-03-20 NOTE — Transfer of Care (Signed)
Immediate Anesthesia Transfer of Care Note  Patient: Kimberly Cowan  Procedure(s) Performed: Procedure(s): LUMBAR SPINAL CORD STIMULATOR INSERTION (N/A)  Patient Location: PACU  Anesthesia Type:General  Level of Consciousness: awake, alert  and oriented  Airway & Oxygen Therapy: Patient Spontanous Breathing and Patient connected to nasal cannula oxygen  Post-op Assessment: Report given to RN, Post -op Vital signs reviewed and stable and Patient moving all extremities  Post vital signs: Reviewed and stable  Last Vitals:  Filed Vitals:   03/20/16 1211  BP: 145/82  Pulse: 80  Temp: 37.1 C  Resp: 16    Last Pain:  Filed Vitals:   03/20/16 1737  PainSc: 7       Patients Stated Pain Goal: 5 ([phone] [id number])  Complications: No apparent anesthesia complications

## 2016-03-21 DIAGNOSIS — M545 Low back pain: Secondary | ICD-10-CM | POA: Diagnosis not present

## 2016-03-21 DIAGNOSIS — Z6841 Body Mass Index (BMI) 40.0 and over, adult: Secondary | ICD-10-CM | POA: Diagnosis not present

## 2016-03-21 DIAGNOSIS — M1990 Unspecified osteoarthritis, unspecified site: Secondary | ICD-10-CM | POA: Diagnosis not present

## 2016-03-21 DIAGNOSIS — F418 Other specified anxiety disorders: Secondary | ICD-10-CM | POA: Diagnosis not present

## 2016-03-21 DIAGNOSIS — Z880 Allergy status to penicillin: Secondary | ICD-10-CM | POA: Diagnosis not present

## 2016-03-21 DIAGNOSIS — G8929 Other chronic pain: Secondary | ICD-10-CM | POA: Diagnosis not present

## 2016-03-21 MED ORDER — OXYCODONE-ACETAMINOPHEN 5-325 MG PO TABS: 1.0000 | ORAL_TABLET | ORAL | Status: DC | PRN

## 2016-03-21 NOTE — Discharge Summary (Signed)
Date of Admission: 03/20/2016  Date of Discharge: 03/21/2016  PRE-OPERATIVE DIAGNOSIS: Chronic back pain  POST-OPERATIVE DIAGNOSIS: Same  PROCEDURE: Permanent spinal cord stimulator placement with [place] for lead paddle requiring thoracic laminectomy and placement of permanent battery  Attending: Eustace Moore, MD  Hospital Course:  The patient was admitted for the above listed operation and had an uncomplicated post-operative course.  They were discharged in stable condition.  Follow up: 3 weeks    Medication List    TAKE these medications        Cetirizine HCl 10 MG Caps  Take 1 capsule (10 mg total) by mouth daily.     DULoxetine 30 MG capsule  Commonly known as:  CYMBALTA  Take 30 mg by mouth 2 (two) times daily.     erythromycin ophthalmic ointment  Place 1 application into the right eye at bedtime. Place 1/2 inch into right lower inner lid at bedtime     gabapentin 300 MG capsule  Commonly known as:  NEURONTIN  Take 600 mg by mouth at bedtime.     oxycodone-acetaminophen 2.5-325 MG tablet  Commonly known as:  PERCOCET  Take 1 tablet by mouth every 6 (six) hours as needed for pain. Not after 1800 because of sleep apnea     oxyCODONE-acetaminophen 5-325 MG tablet  Commonly known as:  PERCOCET/ROXICET  Take 1-2 tablets by mouth every 4 (four) hours as needed for moderate pain.     rizatriptan 10 MG disintegrating tablet  Commonly known as:  MAXALT-MLT  TAKE 1 TABLET BY MOUTH AS NEEDED FOR MIGRAINE. MAY REPEAT IN 2 HOURS IF NEEDED     sulfamethoxazole-trimethoprim 800-160 MG tablet  Commonly known as:  BACTRIM DS,SEPTRA DS  Take 1 tablet by mouth 2 (two) times daily.

## 2016-03-21 NOTE — Anesthesia Postprocedure Evaluation (Signed)
Anesthesia Post Note  Patient: Kimberly Cowan  Procedure(s) Performed: Procedure(s) (LRB): LUMBAR SPINAL CORD STIMULATOR INSERTION (N/A)  Patient location during evaluation: PACU Anesthesia Type: General Level of consciousness: awake and alert Pain management: pain level controlled Vital Signs Assessment: post-procedure vital signs reviewed and stable Respiratory status: spontaneous breathing, nonlabored ventilation, respiratory function stable and patient connected to nasal cannula oxygen Cardiovascular status: blood pressure returned to baseline and stable Postop Assessment: no signs of nausea or vomiting Anesthetic complications: no    Last Vitals:  Filed Vitals:   03/20/16 2300 03/21/16 0340  BP: 118/59 136/55  Pulse: 86 84  Temp: 36.6 C 36.5 C  Resp: 18 20    Last Pain:  Filed Vitals:   03/21/16 0506  PainSc: 7                  [name] J

## 2016-03-21 NOTE — Progress Notes (Signed)
Patient alert and oriented, mae's well, voiding adequate amount of urine, swallowing without difficulty, c/o pain and medication given prior to discharged. Patient discharged home with family. Script and discharged instructions given to patient. Patient and family stated understanding of d/c instructions given and has an appointment with MD.  

## 2016-03-23 ENCOUNTER — Encounter (HOSPITAL_COMMUNITY): Payer: Self-pay | Admitting: Neurological Surgery

## 2016-04-08 ENCOUNTER — Ambulatory Visit: Payer: Commercial Managed Care - HMO | Attending: Neurology

## 2016-04-08 DIAGNOSIS — Z9689 Presence of other specified functional implants: Secondary | ICD-10-CM | POA: Insufficient documentation

## 2016-04-08 DIAGNOSIS — Z91040 Latex allergy status: Secondary | ICD-10-CM | POA: Diagnosis not present

## 2016-04-08 DIAGNOSIS — Z79899 Other long term (current) drug therapy: Secondary | ICD-10-CM | POA: Insufficient documentation

## 2016-04-08 DIAGNOSIS — G4733 Obstructive sleep apnea (adult) (pediatric): Secondary | ICD-10-CM | POA: Diagnosis not present

## 2016-04-08 DIAGNOSIS — Z885 Allergy status to narcotic agent status: Secondary | ICD-10-CM | POA: Insufficient documentation

## 2016-04-08 DIAGNOSIS — Z9884 Bariatric surgery status: Secondary | ICD-10-CM | POA: Diagnosis not present

## 2016-04-08 DIAGNOSIS — Z6841 Body Mass Index (BMI) 40.0 and over, adult: Secondary | ICD-10-CM | POA: Diagnosis not present

## 2016-04-08 DIAGNOSIS — Z880 Allergy status to penicillin: Secondary | ICD-10-CM | POA: Insufficient documentation

## 2016-04-14 DIAGNOSIS — G4733 Obstructive sleep apnea (adult) (pediatric): Secondary | ICD-10-CM | POA: Diagnosis not present

## 2016-04-16 ENCOUNTER — Encounter: Payer: Self-pay | Admitting: Internal Medicine

## 2016-04-19 ENCOUNTER — Other Ambulatory Visit: Payer: Self-pay | Admitting: Internal Medicine

## 2016-04-21 ENCOUNTER — Emergency Department
Admission: EM | Admit: 2016-04-21 | Discharge: 2016-04-21 | Disposition: A | Discharge status: Home / Self Care | Payer: Commercial Managed Care - HMO | Attending: Emergency Medicine | Admitting: Emergency Medicine

## 2016-04-21 ENCOUNTER — Emergency Department: Payer: Commercial Managed Care - HMO

## 2016-04-21 ENCOUNTER — Encounter: Payer: Self-pay | Admitting: Emergency Medicine

## 2016-04-21 DIAGNOSIS — R0602 Shortness of breath: Secondary | ICD-10-CM | POA: Insufficient documentation

## 2016-04-21 DIAGNOSIS — R05 Cough: Secondary | ICD-10-CM | POA: Diagnosis not present

## 2016-04-21 DIAGNOSIS — Z5321 Procedure and treatment not carried out due to patient leaving prior to being seen by health care provider: Secondary | ICD-10-CM | POA: Diagnosis not present

## 2016-04-21 NOTE — ED Notes (Signed)
Unable to obtain EKG due to pt cannot tolerate laying down; EKG has too much artifact with patient sitting up.

## 2016-04-21 NOTE — ED Triage Notes (Signed)
Pt reports shortness of breath today, has had a productive cough with yellow sputum.

## 2016-04-22 ENCOUNTER — Encounter: Payer: Self-pay | Admitting: Internal Medicine

## 2016-04-22 ENCOUNTER — Ambulatory Visit (INDEPENDENT_AMBULATORY_CARE_PROVIDER_SITE_OTHER): Payer: Commercial Managed Care - HMO | Admitting: Internal Medicine

## 2016-04-22 VITALS — BP 132/82 | Temp 98.4°F | Ht 67.0 in | Wt 333.2 lb

## 2016-04-22 DIAGNOSIS — J0100 Acute maxillary sinusitis, unspecified: Secondary | ICD-10-CM | POA: Diagnosis not present

## 2016-04-22 DIAGNOSIS — M778 Other enthesopathies, not elsewhere classified: Secondary | ICD-10-CM | POA: Diagnosis not present

## 2016-04-22 DIAGNOSIS — M5416 Radiculopathy, lumbar region: Secondary | ICD-10-CM | POA: Diagnosis not present

## 2016-04-22 MED ORDER — GUAIFENESIN-CODEINE 100-10 MG/5ML PO SYRP: 5.0000 mL | ORAL_SOLUTION | Freq: Three times a day (TID) | ORAL | 0 refills | Status: DC | PRN

## 2016-04-22 MED ORDER — METHYLPREDNISOLONE 4 MG PO TBPK: ORAL_TABLET | ORAL | 0 refills | Status: DC

## 2016-04-22 MED ORDER — SULFAMETHOXAZOLE-TRIMETHOPRIM 800-160 MG PO TABS: 1.0000 | ORAL_TABLET | Freq: Two times a day (BID) | ORAL | 0 refills | Status: DC

## 2016-04-22 MED ORDER — FLUTICASONE PROPIONATE 50 MCG/ACT NA SUSP: 2.0000 | Freq: Every day | NASAL | 1 refills | Status: DC

## 2016-04-22 NOTE — Progress Notes (Signed)
Date:  04/22/2016   Name:  Kimberly Cowan   DOB:  [birth year]   MRN:  [id number]   Chief Complaint: Cough Cough  This is a new problem. The current episode started in the past 7 days. The problem has been gradually worsening. The problem occurs every few minutes. The cough is productive of sputum. Associated symptoms include a fever, myalgias, nasal congestion, postnasal drip, shortness of breath and wheezing. Pertinent negatives include no chest pain or rash. The symptoms are aggravated by exercise.  Wrist Pain   The pain is present in the right wrist. This is a new problem. The current episode started more than 1 month ago. There has been a history of trauma (fell on outstretched wrist 2 months ago - xrays negative at [facility]). The problem occurs constantly. The problem has been gradually worsening. The quality of the pain is described as burning and aching. The pain is at a severity of 5/10. The pain is moderate. Associated symptoms include a fever, an inability to bear weight and a limited range of motion. The symptoms are aggravated by activity. She has tried oral narcotics and rest (and splint) for the symptoms. The treatment provided mild relief.      Review of Systems  Constitutional: Positive for fever.  HENT: Positive for postnasal drip.   Respiratory: Positive for cough, shortness of breath and wheezing.   Cardiovascular: Negative for chest pain and palpitations.  Musculoskeletal: Positive for arthralgias, back pain (much improved), joint swelling and myalgias.  Skin: Negative for rash.    Patient Active Problem List   Diagnosis Date Noted  . Back pain 03/20/2016  . Obstructive sleep apnea of adult 03/04/2016  . Back pain at L4-L5 level 08/14/2015  . DDD (degenerative disc disease), lumbosacral 08/14/2015  . Bilateral lumbar radiculopathy 08/14/2015  . Spinal stenosis of lumbar region 08/14/2015  . S/P spinal surgery 08/14/2015  . Status post gastric bypass for  obesity 07/04/2015  . Hyperglycemia 07/01/2015  . Spinal stenosis, lumbar region, with neurogenic claudication 11/11/2012  . Clinical depression 04/04/2012  . Headache, migraine 04/04/2012    Prior to Admission medications   Medication Sig Start Date End Date Taking? Authorizing Provider  Cetirizine HCl 10 MG CAPS Take 1 capsule (10 mg total) by mouth daily. 01/16/16  Yes Cari B Triplett, FNP  DULoxetine (CYMBALTA) 30 MG capsule Take 30 mg by mouth 2 (two) times daily.  02/05/16  Yes Historical Provider, MD  gabapentin (NEURONTIN) 300 MG capsule Take 600 mg by mouth at bedtime.  01/01/16  Yes Historical Provider, MD  oxycodone-acetaminophen (PERCOCET) 2.5-325 MG tablet Take 1 tablet by mouth every 6 (six) hours as needed for pain. Not after 1800 because of sleep apnea   Yes Historical Provider, MD  oxyCODONE-acetaminophen (PERCOCET/ROXICET) 5-325 MG tablet Take 1-2 tablets by mouth every 4 (four) hours as needed for moderate pain. 03/21/16  Yes Kevan Ny Ditty, MD  rizatriptan (MAXALT-MLT) 10 MG disintegrating tablet TAKE 1 TABLET BY MOUTH AS NEEDED FOR MIGRAINE. MAY REPEAT IN 2 HOURS IF NEEDED. 04/19/16  Yes Glean Hess, MD    Allergies  Allergen Reactions  . Morphine And Related Hives, Shortness Of Breath, Nausea And Vomiting and Other (See Comments)    "can't breathe"  . Penicillins Anaphylaxis, Hives, Shortness Of Breath and Nausea And Vomiting    "can't breathe", Has patient had a PCN reaction causing immediate rash, facial/tongue/throat swelling, SOB or lightheadedness with hypotension: Yes Has patient had a PCN reaction  causing severe rash involving mucus membranes or skin necrosis: No Has patient had a PCN reaction that required hospitalization No Has patient had a PCN reaction occurring within the last 10 years: No If all of the above answers are "NO", then may proceed with Cephalosporin use.   . Latex Hives  . Baclofen Nausea Only    Jittery, anxious    Past Surgical  History:  Procedure Laterality Date  . ABDOMINAL HYSTERECTOMY  10/2006   cervix and ovaries remain  . BACK SURGERY  2014   lumbar fusion  . BACK SURGERY  2009   discectomy  . BREAST REDUCTION SURGERY    . c sections    . GASTRIC BYPASS  2005  . REDUCTION MAMMAPLASTY Bilateral    2001  . SPINAL CORD STIMULATOR INSERTION N/A 03/20/2016   Procedure: LUMBAR SPINAL CORD STIMULATOR INSERTION;  Surgeon: Eustace Moore, MD;  Location: [facility];  Service: Neurosurgery;  Laterality: N/A;  . TONSILLECTOMY      Social History  Substance Use Topics  . Smoking status: Never Smoker  . Smokeless tobacco: Never Used  . Alcohol use No     Medication list has been reviewed and updated.   Physical Exam  Constitutional: She is oriented to person, place, and time. She appears well-developed. No distress.  HENT:  Head: Normocephalic and atraumatic.  Right Ear: Tympanic membrane and ear canal normal.  Left Ear: Tympanic membrane and ear canal normal.  Nose: Right sinus exhibits maxillary sinus tenderness and frontal sinus tenderness. Left sinus exhibits maxillary sinus tenderness and frontal sinus tenderness.  Mouth/Throat: Oropharynx is clear and moist.  Cardiovascular: Normal rate and regular rhythm.   Pulmonary/Chest: Effort normal and breath sounds normal. No respiratory distress. She has no wheezes. She has no rhonchi.  Musculoskeletal:       Right wrist: She exhibits decreased range of motion, tenderness and swelling.  Neurological: She is alert and oriented to person, place, and time.  Skin: Skin is warm and dry. No rash noted.  Psychiatric: She has a normal mood and affect. Her behavior is normal. Thought content normal.  Nursing note and vitals reviewed.   BP 132/82 (BP Location: Right Arm, Patient Position: Sitting, Cuff Size: Large)   Temp 98.4 F (36.9 C)   Ht 5\' 7"  (1.702 m)   Wt (!) 333 lb 3.2 oz (151.1 kg)   BMI 52.19 kg/m   Assessment and Plan: 1. Acute maxillary  sinusitis, recurrence not specified - fluticasone (FLONASE) 50 MCG/ACT nasal spray; Place 2 sprays into both nostrils daily.  Dispense: 16 g; Refill: 1 - sulfamethoxazole-trimethoprim (BACTRIM DS,SEPTRA DS) 800-160 MG tablet; Take 1 tablet by mouth 2 (two) times daily.  Dispense: 20 tablet; Refill: 0 - guaiFENesin-codeine (ROBITUSSIN AC) 100-10 MG/5ML syrup; Take 5 mLs by mouth 3 (three) times daily as needed for cough.  Dispense: 150 mL; Refill: 0  2. Right wrist tendonitis May need Ortho if no improvement with prednisone - methylPREDNISolone (MEDROL DOSEPAK) 4 MG TBPK tablet; Take 6 pills on day 1 the 5 pills day 2 then 4 pills day 3 then 3 pills day 4 then 2 pills day 5 then one pills day 6 then stop  Dispense: 21 tablet; Refill: 0  3. Bilateral lumbar radiculopathy Improved since Spinal stimulator implantation   Halina Maidens, MD [facility]  04/22/2016

## 2016-04-28 ENCOUNTER — Ambulatory Visit: Payer: Commercial Managed Care - HMO | Attending: Neurology

## 2016-04-28 DIAGNOSIS — G4733 Obstructive sleep apnea (adult) (pediatric): Secondary | ICD-10-CM | POA: Diagnosis not present

## 2016-04-29 DIAGNOSIS — G4733 Obstructive sleep apnea (adult) (pediatric): Secondary | ICD-10-CM | POA: Diagnosis not present

## 2016-05-01 ENCOUNTER — Telehealth: Payer: Self-pay

## 2016-05-01 NOTE — Telephone Encounter (Signed)
Patient found company to order CPAP from. [place] [phone]. She said she was told PCP will review Sleep Study Results with her.Advised will call her when PCP returns.

## 2016-05-04 ENCOUNTER — Emergency Department: Payer: Commercial Managed Care - HMO

## 2016-05-04 ENCOUNTER — Emergency Department
Admission: EM | Admit: 2016-05-04 | Discharge: 2016-05-04 | Disposition: A | Discharge status: Home / Self Care | Payer: Commercial Managed Care - HMO | Attending: Emergency Medicine | Admitting: Emergency Medicine

## 2016-05-04 ENCOUNTER — Encounter: Payer: Self-pay | Admitting: Emergency Medicine

## 2016-05-04 DIAGNOSIS — Z79899 Other long term (current) drug therapy: Secondary | ICD-10-CM | POA: Diagnosis not present

## 2016-05-04 DIAGNOSIS — M25572 Pain in left ankle and joints of left foot: Secondary | ICD-10-CM | POA: Diagnosis not present

## 2016-05-04 DIAGNOSIS — Z7951 Long term (current) use of inhaled steroids: Secondary | ICD-10-CM | POA: Insufficient documentation

## 2016-05-04 DIAGNOSIS — M79672 Pain in left foot: Secondary | ICD-10-CM | POA: Diagnosis not present

## 2016-05-04 MED ORDER — IBUPROFEN 800 MG PO TABS: 800.0000 mg | ORAL_TABLET | Freq: Three times a day (TID) | ORAL | 0 refills | Status: DC | PRN

## 2016-05-04 NOTE — ED Provider Notes (Signed)
[facility] Emergency Department Provider Note   ____________________________________________   None    (approximate)  I have reviewed the triage vital signs and the nursing notes.   HISTORY  Chief Complaint Foot Pain    HPI Kimberly Cowan is a 50 y.o. female who presents today for evaluation of left foot/ankle trauma. Patient states that she was running away from hornets in her house when she hit the wall with her toe and ankle. She is unable to bare weight on the left ankle and has limited ROM secondary to pain. Patient denies numbness or tingling. She notes swelling to the left ankle but denies bruising or redness.   Past Medical History:  Diagnosis Date  . Anxiety   . Arthritis    lower spine  . Chronic back pain   . Clinical depression 04/04/2012  . Depression   . Headache(784.0)    migraines  . Pneumonia    walking pneumonia  . Sleep apnea    no cpcap     last sleep study > 4 yrs    Patient Active Problem List   Diagnosis Date Noted  . Back pain 03/20/2016  . Obstructive sleep apnea of adult 03/04/2016  . Back pain at L4-L5 level 08/14/2015  . DDD (degenerative disc disease), lumbosacral 08/14/2015  . Bilateral lumbar radiculopathy 08/14/2015  . Spinal stenosis of lumbar region 08/14/2015  . S/P spinal surgery 08/14/2015  . Status post gastric bypass for obesity 07/04/2015  . Hyperglycemia 07/01/2015  . Spinal stenosis, lumbar region, with neurogenic claudication 11/11/2012  . Clinical depression 04/04/2012  . Headache, migraine 04/04/2012    Past Surgical History:  Procedure Laterality Date  . ABDOMINAL HYSTERECTOMY  10/2006   cervix and ovaries remain  . BACK SURGERY  2014   lumbar fusion  . BACK SURGERY  2009   discectomy  . BREAST REDUCTION SURGERY    . c sections    . GASTRIC BYPASS  2005  . REDUCTION MAMMAPLASTY Bilateral    2001  . SPINAL CORD STIMULATOR INSERTION N/A 03/20/2016   Procedure: LUMBAR  SPINAL CORD STIMULATOR INSERTION;  Surgeon: Eustace Moore, MD;  Location: [place] [facility];  Service: Neurosurgery;  Laterality: N/A;  . TONSILLECTOMY      Prior to Admission medications   Medication Sig Start Date End Date Taking? Authorizing Provider  Cetirizine HCl 10 MG CAPS Take 1 capsule (10 mg total) by mouth daily. 01/16/16   Victorino Dike, FNP  DULoxetine (CYMBALTA) 30 MG capsule Take 30 mg by mouth 2 (two) times daily.  02/05/16   Historical Provider, MD  fluticasone (FLONASE) 50 MCG/ACT nasal spray Place 2 sprays into both nostrils daily. 04/22/16   Glean Hess, MD  gabapentin (NEURONTIN) 300 MG capsule Take 600 mg by mouth at bedtime.  01/01/16   Historical Provider, MD  guaiFENesin-codeine (ROBITUSSIN AC) 100-10 MG/5ML syrup Take 5 mLs by mouth 3 (three) times daily as needed for cough. 04/22/16   Glean Hess, MD  ibuprofen (ADVIL,MOTRIN) 800 MG tablet Take 1 tablet (800 mg total) by mouth every 8 (eight) hours as needed. 05/04/16   Pierce Crane Beers, PA-C  methylPREDNISolone (MEDROL DOSEPAK) 4 MG TBPK tablet Take 6 pills on day 1 the 5 pills day 2 then 4 pills day 3 then 3 pills day 4 then 2 pills day 5 then one pills day 6 then stop 04/22/16   Glean Hess, MD  oxycodone-acetaminophen (PERCOCET) 2.5-325 MG tablet Take  1 tablet by mouth every 6 (six) hours as needed for pain. Not after 1800 because of sleep apnea    Historical Provider, MD  oxyCODONE-acetaminophen (PERCOCET/ROXICET) 5-325 MG tablet Take 1-2 tablets by mouth every 4 (four) hours as needed for moderate pain. 03/21/16   Kevan Ny Ditty, MD  rizatriptan (MAXALT-MLT) 10 MG disintegrating tablet TAKE 1 TABLET BY MOUTH AS NEEDED FOR MIGRAINE. MAY REPEAT IN 2 HOURS IF NEEDED. 04/19/16   Glean Hess, MD  sulfamethoxazole-trimethoprim (BACTRIM DS,SEPTRA DS) 800-160 MG tablet Take 1 tablet by mouth 2 (two) times daily. 04/22/16   Glean Hess, MD    Allergies Morphine and related; Penicillins; Latex; and  Baclofen  Family History  Problem Relation Age of Onset  . Diabetes Mother   . Hypertension Mother   . Diabetes Sister   . Hypertension Father   . Hypertension Sister   . Breast cancer Neg Hx     Social History Social History  Substance Use Topics  . Smoking status: Never Smoker  . Smokeless tobacco: Never Used  . Alcohol use No    Review of Systems Constitutional: No fever/chills Cardiovascular: Denies chest pain. Respiratory: Denies shortness of breath. Musculoskeletal: Negative for back pain. She notes left ankle pain but states that it does not radiate up into her leg. Skin: Negative for rash. Patient notes swelling but no bruising Neurological: Negative for headaches, focal weakness or numbness.  10-point ROS otherwise negative.  ____________________________________________   PHYSICAL EXAM:  VITAL SIGNS: ED Triage Vitals  Enc Vitals Group     BP 05/04/16 1345 131/81     Pulse Rate 05/04/16 1345 98     Resp 05/04/16 1345 20     Temp 05/04/16 1345 98.6 F (37 C)     Temp Source 05/04/16 1345 Oral     SpO2 05/04/16 1345 95 %     Weight 05/04/16 1345 (!) 331 lb (150.1 kg)     Height 05/04/16 1345 5\' 7"  (1.702 m)     Head Circumference --      Peak Flow --      Pain Score 05/04/16 1344 6     Pain Loc --      Pain Edu? --      Excl. in [place]? --     Constitutional: Alert and oriented. Well appearing and in no acute distress. Eyes: Conjunctivae are normal.  Head: Atraumatic. Cardiovascular: Normal rate, regular rhythm. Grossly normal heart sounds.  Good peripheral circulation. Pedal pulses intact Respiratory: Normal respiratory effort.  No retractions. Lungs CTAB. Musculoskeletal: Mild swelling to the lateral aspect of her left ankle. Decreased ROM secondary to pain.  Neurologic:  Normal speech and language. No gross focal neurologic deficits are appreciated. No gait instability. Skin:  Skin is warm, dry and intact. No rash noted. No bruising or redness  noted Psychiatric: Mood and affect are normal. Speech and behavior are normal.  ____________________________________________   LABS (all labs ordered are listed, but only abnormal results are displayed)  Labs Reviewed - No data to display ____________________________________________  EKG  ____________________________________________  RADIOLOGY  No acute osseous findings noted ____________________________________________   PROCEDURES  Procedure(s) performed: None  Procedures  Critical Care performed: No  ____________________________________________   INITIAL IMPRESSION / ASSESSMENT AND PLAN / ED COURSE  Pertinent labs & imaging results that were available during my care of the patient were reviewed by me and considered in my medical decision making (see chart for details).  Acute left foot contusion and  sprain. Rx given for Motrin 800 mg 3 times a day Ace wrap for her ankle foot. Patient follow-up PCP or return to ER with any worsening symptomology.  Clinical Course     ____________________________________________   FINAL CLINICAL IMPRESSION(S) / ED DIAGNOSES  Final diagnoses:  Foot pain, left      NEW MEDICATIONS STARTED DURING THIS VISIT:  Discharge Medication List as of 05/04/2016  3:04 PM    START taking these medications   Details  ibuprofen (ADVIL,MOTRIN) 800 MG tablet Take 1 tablet (800 mg total) by mouth every 8 (eight) hours as needed., Starting Mon 05/04/2016, Print         Note:  This document was prepared using Dragon voice recognition software and may include unintentional dictation errors.    Arlyss Repress, PA-C 05/04/16 [place], MD 05/04/16 [phone]

## 2016-05-04 NOTE — ED Triage Notes (Signed)
States she ran into  Wall injury to left foot/ankle

## 2016-05-05 NOTE — Telephone Encounter (Signed)
Please check on her results. We are not getting anyone results and need to meet with Kimberly Cowan about this per [place]. I have a Sleep Study Folder now on my desk where ALL Sleep Study papers need to go for now. Let me know.

## 2016-05-06 ENCOUNTER — Other Ambulatory Visit: Payer: Self-pay

## 2016-05-06 DIAGNOSIS — G4733 Obstructive sleep apnea (adult) (pediatric): Secondary | ICD-10-CM

## 2016-05-14 DIAGNOSIS — M4800 Spinal stenosis, site unspecified: Secondary | ICD-10-CM | POA: Diagnosis not present

## 2016-05-14 DIAGNOSIS — G4733 Obstructive sleep apnea (adult) (pediatric): Secondary | ICD-10-CM | POA: Diagnosis not present

## 2016-05-28 DIAGNOSIS — F419 Anxiety disorder, unspecified: Secondary | ICD-10-CM | POA: Diagnosis not present

## 2016-05-28 DIAGNOSIS — M5416 Radiculopathy, lumbar region: Secondary | ICD-10-CM | POA: Diagnosis not present

## 2016-05-28 DIAGNOSIS — G4733 Obstructive sleep apnea (adult) (pediatric): Secondary | ICD-10-CM | POA: Diagnosis not present

## 2016-05-28 DIAGNOSIS — F329 Major depressive disorder, single episode, unspecified: Secondary | ICD-10-CM | POA: Diagnosis not present

## 2016-06-03 DIAGNOSIS — M4800 Spinal stenosis, site unspecified: Secondary | ICD-10-CM | POA: Diagnosis not present

## 2016-06-03 DIAGNOSIS — G4733 Obstructive sleep apnea (adult) (pediatric): Secondary | ICD-10-CM | POA: Diagnosis not present

## 2016-06-07 ENCOUNTER — Encounter: Payer: Self-pay | Admitting: *Deleted

## 2016-06-07 ENCOUNTER — Emergency Department
Admission: EM | Admit: 2016-06-07 | Discharge: 2016-06-07 | Disposition: A | Discharge status: Home / Self Care | Payer: Commercial Managed Care - HMO | Attending: Emergency Medicine | Admitting: Emergency Medicine

## 2016-06-07 DIAGNOSIS — Z79899 Other long term (current) drug therapy: Secondary | ICD-10-CM | POA: Insufficient documentation

## 2016-06-07 DIAGNOSIS — M722 Plantar fascial fibromatosis: Secondary | ICD-10-CM | POA: Diagnosis not present

## 2016-06-07 DIAGNOSIS — M79672 Pain in left foot: Secondary | ICD-10-CM | POA: Diagnosis present

## 2016-06-07 MED ORDER — MELOXICAM 15 MG PO TABS: 15.0000 mg | ORAL_TABLET | Freq: Every day | ORAL | 0 refills | Status: DC

## 2016-06-07 NOTE — Discharge Instructions (Signed)
Call to schedule a follow up with Dr. Cleda Mccreedy. Take the meloxicam daily as prescribed. Return to the ER for symptoms that change or worsen if you are unable to schedule an appointment with your PCP or Dr. Cleda Mccreedy.

## 2016-06-07 NOTE — ED Triage Notes (Signed)
States left heel pain for several weeks, denies any injury, pt ambulatory

## 2016-06-07 NOTE — ED Provider Notes (Signed)
[facility] Emergency Department Provider Note ____________________________________________  Time seen: Approximately 3:21 PM  I have reviewed the triage vital signs and the nursing notes.   HISTORY  Chief Complaint Foot Pain    HPI Kimberly Cowan is a 50 y.o. female who presents to the emergency department for several weeks of heel pain. Both heels hurt, but the left is worse. Pain is worse in the morning and improves throughout the day. No injury. She takes percocet occasionally, which is really for her back pain. Pain has not resolved with percocet.  Past Medical History:  Diagnosis Date  . Anxiety   . Arthritis    lower spine  . Chronic back pain   . Clinical depression 04/04/2012  . Depression   . Headache(784.0)    migraines  . Pneumonia    walking pneumonia  . Sleep apnea    no cpcap     last sleep study > 4 yrs    Patient Active Problem List   Diagnosis Date Noted  . Back pain 03/20/2016  . Obstructive sleep apnea of adult 03/04/2016  . Back pain at L4-L5 level 08/14/2015  . DDD (degenerative disc disease), lumbosacral 08/14/2015  . Bilateral lumbar radiculopathy 08/14/2015  . Spinal stenosis of lumbar region 08/14/2015  . S/P spinal surgery 08/14/2015  . Status post gastric bypass for obesity 07/04/2015  . Hyperglycemia 07/01/2015  . Spinal stenosis, lumbar region, with neurogenic claudication 11/11/2012  . Clinical depression 04/04/2012  . Headache, migraine 04/04/2012    Past Surgical History:  Procedure Laterality Date  . ABDOMINAL HYSTERECTOMY  10/2006   cervix and ovaries remain  . BACK SURGERY  2014   lumbar fusion  . BACK SURGERY  2009   discectomy  . BREAST REDUCTION SURGERY    . c sections    . GASTRIC BYPASS  2005  . REDUCTION MAMMAPLASTY Bilateral    2001  . SPINAL CORD STIMULATOR INSERTION N/A 03/20/2016   Procedure: LUMBAR SPINAL CORD STIMULATOR INSERTION;  Surgeon: Eustace Moore, MD;  Location: [place]  [facility]UR[facility] OR[facility];  Service: Neurosurgery;  Laterality: N/A;  . TONSILLECTOMY      Prior to Admission medications   Medication Sig Start Date End Date Taking? Authorizing Provider  Cetirizine HCl 10 MG CAPS Take 1 capsule (10 mg total) by mouth daily. 01/16/16   Victorino Dike, FNP  DULoxetine (CYMBALTA) 30 MG capsule Take 30 mg by mouth 2 (two) times daily.  02/05/16   Historical Provider, MD  fluticasone (FLONASE) 50 MCG/ACT nasal spray Place 2 sprays into both nostrils daily. 04/22/16   Glean Hess, MD  gabapentin (NEURONTIN) 300 MG capsule Take 600 mg by mouth at bedtime.  01/01/16   Historical Provider, MD  guaiFENesin-codeine (ROBITUSSIN AC) 100-10 MG/5ML syrup Take 5 mLs by mouth 3 (three) times daily as needed for cough. 04/22/16   Glean Hess, MD  ibuprofen (ADVIL,MOTRIN) 800 MG tablet Take 1 tablet (800 mg total) by mouth every 8 (eight) hours as needed. 05/04/16   Pierce Crane Beers, PA-C  meloxicam (MOBIC) 15 MG tablet Take 1 tablet (15 mg total) by mouth daily. 06/07/16   Victorino Dike, FNP  methylPREDNISolone (MEDROL DOSEPAK) 4 MG TBPK tablet Take 6 pills on day 1 the 5 pills day 2 then 4 pills day 3 then 3 pills day 4 then 2 pills day 5 then one pills day 6 then stop 04/22/16   Glean Hess, MD  oxycodone-acetaminophen (PERCOCET) 2.5-325 MG  tablet Take 1 tablet by mouth every 6 (six) hours as needed for pain. Not after 1800 because of sleep apnea    Historical Provider, MD  oxyCODONE-acetaminophen (PERCOCET/ROXICET) 5-325 MG tablet Take 1-2 tablets by mouth every 4 (four) hours as needed for moderate pain. 03/21/16   Kevan Ny Ditty, MD  rizatriptan (MAXALT-MLT) 10 MG disintegrating tablet TAKE 1 TABLET BY MOUTH AS NEEDED FOR MIGRAINE. MAY REPEAT IN 2 HOURS IF NEEDED. 04/19/16   Glean Hess, MD  sulfamethoxazole-trimethoprim (BACTRIM DS,SEPTRA DS) 800-160 MG tablet Take 1 tablet by mouth 2 (two) times daily. 04/22/16   Glean Hess, MD    Allergies Morphine and related;  Penicillins; Latex; and Baclofen  Family History  Problem Relation Age of Onset  . Diabetes Mother   . Hypertension Mother   . Diabetes Sister   . Hypertension Father   . Hypertension Sister   . Breast cancer Neg Hx     Social History Social History  Substance Use Topics  . Smoking status: Never Smoker  . Smokeless tobacco: Never Used  . Alcohol use No    Review of Systems Constitutional: No recent illness. Cardiovascular: Denies chest pain or palpitations. Respiratory: Denies shortness of breath. Musculoskeletal: Pain in bilateral heels, worse in let. Skin: Negative for rash, wound, lesion. Neurological: Negative for focal weakness or numbness.  ____________________________________________   PHYSICAL EXAM:  VITAL SIGNS: ED Triage Vitals  Enc Vitals Group     BP 06/07/16 1414 135/74     Pulse Rate 06/07/16 1414 92     Resp 06/07/16 1414 18     Temp 06/07/16 1414 98.2 F (36.8 C)     Temp Source 06/07/16 1414 Oral     SpO2 06/07/16 1414 96 %     Weight 06/07/16 1415 231 lb (104.8 kg)     Height 06/07/16 1415 5' 7.5" (1.715 m)     Head Circumference --      Peak Flow --      Pain Score 06/07/16 1419 7     Pain Loc --      Pain Edu? --      Excl. in [place]? --     Constitutional: Alert and oriented. Well appearing and in no acute distress. Eyes: Conjunctivae are normal. EOMI. Head: Atraumatic. Neck: No stridor.  Respiratory: Normal respiratory effort.   Musculoskeletal: Full ROM of bilateral feet. Tenderness on the medial aspect of the left arch that extends into the medial heel.  Neurologic:  Normal speech and language. No gross focal neurologic deficits are appreciated. Speech is normal. No gait instability. Skin:  Skin is warm, dry and intact. Atraumatic. Psychiatric: Mood and affect are normal. Speech and behavior are normal.  ____________________________________________   LABS (all labs ordered are listed, but only abnormal results are  displayed)  Labs Reviewed - No data to display ____________________________________________  RADIOLOGY  Not indicated. ____________________________________________   PROCEDURES  Procedure(s) performed: None   ____________________________________________   INITIAL IMPRESSION / ASSESSMENT AND PLAN / ED COURSE  Clinical Course    Pertinent labs & imaging results that were available during my care of the patient were reviewed by me and considered in my medical decision making (see chart for details).  Patient is to take meloxicam daily as prescribed. She is to follow up with Dr. Cleda Mccreedy. Exercises and preventative measures were discussed. She is to return to the ER for symptoms that change or worsen if unable to schedule an appointment with PCP or specialist. ____________________________________________  FINAL CLINICAL IMPRESSION(S) / ED DIAGNOSES  Final diagnoses:  Plantar fasciitis of left foot       Victorino Dike, FNP 06/07/16 1539    Harvest Dark, MD 06/07/16 2355

## 2016-06-08 ENCOUNTER — Other Ambulatory Visit: Payer: Self-pay

## 2016-06-08 ENCOUNTER — Other Ambulatory Visit: Payer: Self-pay | Admitting: Internal Medicine

## 2016-06-08 DIAGNOSIS — M79672 Pain in left foot: Principal | ICD-10-CM

## 2016-06-08 DIAGNOSIS — M79671 Pain in right foot: Secondary | ICD-10-CM

## 2016-06-08 DIAGNOSIS — Z0100 Encounter for examination of eyes and vision without abnormal findings: Secondary | ICD-10-CM

## 2016-06-12 DIAGNOSIS — M722 Plantar fascial fibromatosis: Secondary | ICD-10-CM | POA: Diagnosis not present

## 2016-06-12 DIAGNOSIS — M79672 Pain in left foot: Secondary | ICD-10-CM | POA: Diagnosis not present

## 2016-06-14 DIAGNOSIS — G4733 Obstructive sleep apnea (adult) (pediatric): Secondary | ICD-10-CM | POA: Diagnosis not present

## 2016-06-14 DIAGNOSIS — M4800 Spinal stenosis, site unspecified: Secondary | ICD-10-CM | POA: Diagnosis not present

## 2016-07-14 DIAGNOSIS — M4800 Spinal stenosis, site unspecified: Secondary | ICD-10-CM | POA: Diagnosis not present

## 2016-07-14 DIAGNOSIS — G4733 Obstructive sleep apnea (adult) (pediatric): Secondary | ICD-10-CM | POA: Diagnosis not present

## 2016-08-05 ENCOUNTER — Encounter: Payer: Self-pay | Admitting: Internal Medicine

## 2016-08-05 ENCOUNTER — Ambulatory Visit (INDEPENDENT_AMBULATORY_CARE_PROVIDER_SITE_OTHER): Payer: Commercial Managed Care - HMO | Admitting: Internal Medicine

## 2016-08-05 ENCOUNTER — Other Ambulatory Visit: Payer: Self-pay | Admitting: Internal Medicine

## 2016-08-05 VITALS — BP 127/84 | HR 81 | Resp 16 | Ht 67.5 in | Wt 230.0 lb

## 2016-08-05 DIAGNOSIS — R739 Hyperglycemia, unspecified: Secondary | ICD-10-CM | POA: Diagnosis not present

## 2016-08-05 DIAGNOSIS — R252 Cramp and spasm: Secondary | ICD-10-CM | POA: Diagnosis not present

## 2016-08-05 DIAGNOSIS — M48062 Spinal stenosis, lumbar region with neurogenic claudication: Secondary | ICD-10-CM | POA: Diagnosis not present

## 2016-08-05 DIAGNOSIS — E538 Deficiency of other specified B group vitamins: Secondary | ICD-10-CM | POA: Diagnosis not present

## 2016-08-05 MED ORDER — CYANOCOBALAMIN 1000 MCG/ML IJ SOLN
1000.0000 ug | Freq: Once | INTRAMUSCULAR | Status: AC
  Administered 2016-08-05: 1000 ug via INTRAMUSCULAR

## 2016-08-05 NOTE — Progress Notes (Signed)
Date:  08/05/2016   Name:  Kimberly Cowan   DOB:  1966-03-06   MRN:  [id number]   Chief Complaint: Fatigue and Leg Pain (Cramping started 2 days ago worse at night. Weakness is from spine issues. ) patient has been doing pool exercise regularly since her spinal stimulator placement.  Has also been having noctural leg cramps on both sides off and on.  Last night has a prolonged one.   She is s/p gastric bypass and has hx of B12 def requiring B12 injections.  She stopped them several years ago when she lost her insurance. She has been trying to lose weight but is not having success.  She is concerned about her BS since DM runs strongly in her family.   Review of Systems  Constitutional: Positive for fatigue (over the past week). Negative for chills, fever and unexpected weight change.  HENT: Negative for congestion, sinus pressure and sore throat.   Respiratory: Negative for cough, chest tightness, shortness of breath and stridor.   Cardiovascular: Negative for chest pain, palpitations and leg swelling.  Gastrointestinal: Negative for abdominal pain.  Genitourinary: Negative for dysuria and hematuria.  Musculoskeletal: Positive for myalgias.  Neurological: Negative for dizziness, tremors, syncope and weakness.    Patient Active Problem List   Diagnosis Date Noted  . Obstructive sleep apnea of adult 03/04/2016  . DDD (degenerative disc disease), lumbosacral 08/14/2015  . Bilateral lumbar radiculopathy 08/14/2015  . S/P spinal surgery 08/14/2015  . Status post gastric bypass for obesity 07/04/2015  . Hyperglycemia 07/01/2015  . Spinal stenosis, lumbar region, with neurogenic claudication 11/11/2012  . Clinical depression 04/04/2012  . Headache, migraine 04/04/2012    Prior to Admission medications   Medication Sig Start Date End Date Taking? Authorizing Provider  Cetirizine HCl 10 MG CAPS Take 1 capsule (10 mg total) by mouth daily. 01/16/16  Yes Cari B Triplett, FNP    DULoxetine (CYMBALTA) 30 MG capsule Take 30 mg by mouth 2 (two) times daily.  02/05/16  Yes Historical Provider, MD  fluticasone (FLONASE) 50 MCG/ACT nasal spray Place 2 sprays into both nostrils daily. 04/22/16  Yes Glean Hess, MD  gabapentin (NEURONTIN) 300 MG capsule Take 600 mg by mouth at bedtime.  01/01/16  Yes Historical Provider, MD  oxyCODONE-acetaminophen (PERCOCET/ROXICET) 5-325 MG tablet Take 1-2 tablets by mouth every 4 (four) hours as needed for moderate pain. 03/21/16  Yes Kevan Ny Ditty, MD  rizatriptan (MAXALT-MLT) 10 MG disintegrating tablet TAKE 1 TABLET BY MOUTH AS NEEDED FOR MIGRAINE. MAY REPEAT IN 2 HOURS IF NEEDED. 04/19/16  Yes Glean Hess, MD    Allergies  Allergen Reactions  . Morphine And Related Hives, Shortness Of Breath, Nausea And Vomiting and Other (See Comments)    "can't breathe"  . Penicillins Anaphylaxis, Hives, Shortness Of Breath and Nausea And Vomiting    "can't breathe", Has patient had a PCN reaction causing immediate rash, facial/tongue/throat swelling, SOB or lightheadedness with hypotension: Yes Has patient had a PCN reaction causing severe rash involving mucus membranes or skin necrosis: No Has patient had a PCN reaction that required hospitalization No Has patient had a PCN reaction occurring within the last 10 years: No If all of the above answers are "NO", then may proceed with Cephalosporin use.   . Latex Hives  . Baclofen Nausea Only    Jittery, anxious    Past Surgical History:  Procedure Laterality Date  . ABDOMINAL HYSTERECTOMY  10/2006   cervix and  ovaries remain  . BACK SURGERY  2014   lumbar fusion  . BACK SURGERY  2009   discectomy  . BREAST REDUCTION SURGERY    . c sections    . GASTRIC BYPASS  2005  . REDUCTION MAMMAPLASTY Bilateral    2001  . SPINAL CORD STIMULATOR INSERTION N/A 03/20/2016   Procedure: LUMBAR SPINAL CORD STIMULATOR INSERTION;  Surgeon: Eustace Moore, MD;  Location: [place] [facility];  Service:  Neurosurgery;  Laterality: N/A;  . TONSILLECTOMY      Social History  Substance Use Topics  . Smoking status: Never Smoker  . Smokeless tobacco: Never Used  . Alcohol use No     Medication list has been reviewed and updated.   Physical Exam  Constitutional: She is oriented to person, place, and time. She appears well-developed. No distress.  HENT:  Head: Normocephalic and atraumatic.  Cardiovascular: Normal rate, regular rhythm and normal heart sounds.   Pulmonary/Chest: Effort normal and breath sounds normal. No respiratory distress.  Abdominal: Soft. There is no tenderness.  Musculoskeletal: She exhibits no edema.  Tenderness of both calfs and thighs - no cord or redness noted  Neurological: She is alert and oriented to person, place, and time.  Skin: Skin is warm and dry. No rash noted.  Psychiatric: She has a normal mood and affect. Her behavior is normal. Thought content normal.  Nursing note and vitals reviewed.   BP 127/84   Pulse 81   Resp 16   Ht 5' 7.5" (1.715 m)   Wt 230 lb (104.3 kg)   SpO2 98%   BMI 35.49 kg/m   Assessment and Plan: 1. Spinal stenosis, lumbar region, with neurogenic claudication Pain much improved since Spinal Implant  2. B12 nutritional deficiency Check levels and then begin injections - Vitamin B12 - cyanocobalamin ((VITAMIN B-12)) injection 1,000 mcg; Inject 1 mL (1,000 mcg total) into the muscle once.  3. Hyperglycemia Discussed diet - will consider referral to dietician if A1C is worse - Hemoglobin A1c  4. Muscle cramping Check electrolytes and supplement if needed - Comprehensive metabolic panel - Magnesium   Halina Maidens, MD [facility]  08/05/2016

## 2016-08-06 LAB — COMPREHENSIVE METABOLIC PANEL
A/G RATIO: 1.2 (ref 1.2–2.2)
ALBUMIN: 3.7 g/dL (ref 3.5–5.5)
ALT: 13 IU/L (ref 0–32)
AST: 14 IU/L (ref 0–40)
Alkaline Phosphatase: 91 IU/L (ref 39–117)
BUN / CREAT RATIO: 11 (ref 9–23)
BUN: 8 mg/dL (ref 6–24)
Bilirubin Total: 0.2 mg/dL (ref 0.0–1.2)
CALCIUM: 9.4 mg/dL (ref 8.7–10.2)
CO2: 26 mmol/L (ref 18–29)
Chloride: 104 mmol/L (ref 96–106)
Creatinine, Ser: 0.72 mg/dL (ref 0.57–1.00)
GFR, EST AFRICAN AMERICAN: 115 mL/min/{1.73_m2} (ref >59)
GFR, EST NON AFRICAN AMERICAN: 99 mL/min/{1.73_m2} (ref >59)
GLOBULIN, TOTAL: 3.2 g/dL (ref 1.5–4.5)
Glucose: 70 mg/dL (ref 65–99)
POTASSIUM: 4.3 mmol/L (ref 3.5–5.2)
SODIUM: 143 mmol/L (ref 134–144)
Total Protein: 6.9 g/dL (ref 6.0–8.5)

## 2016-08-06 LAB — MAGNESIUM: Magnesium: 2 mg/dL (ref 1.6–2.3)

## 2016-08-06 LAB — VITAMIN B12: Vitamin B-12: 196 pg/mL — ABNORMAL LOW (ref 211–946)

## 2016-08-06 LAB — HEMOGLOBIN A1C
Est. average glucose Bld gHb Est-mCnc: 131 mg/dL
Hgb A1c MFr Bld: 6.2 % — ABNORMAL HIGH (ref 4.8–5.6)

## 2016-08-11 ENCOUNTER — Telehealth: Payer: Self-pay | Admitting: Internal Medicine

## 2016-08-11 DIAGNOSIS — H40153 Residual stage of open-angle glaucoma, bilateral: Secondary | ICD-10-CM | POA: Diagnosis not present

## 2016-08-11 NOTE — Telephone Encounter (Signed)
Patient is calling in regards to her follow up with a physician about a CPAP machine. It has been over 90 days. Stated she has not heard who she is to Follow Up with.

## 2016-08-11 NOTE — Telephone Encounter (Signed)
Patient was seen for the titration at [facility] and was seen th ere for her sleep study. I left VM to call them and if it something as simple as to ask if you are using CPAP and how often then yes we can see her but first call [facility].

## 2016-08-12 ENCOUNTER — Ambulatory Visit: Payer: Commercial Managed Care - HMO | Admitting: Internal Medicine

## 2016-08-13 NOTE — Telephone Encounter (Signed)
LMTCB

## 2016-08-14 ENCOUNTER — Encounter: Payer: Self-pay | Admitting: Internal Medicine

## 2016-08-14 ENCOUNTER — Ambulatory Visit (INDEPENDENT_AMBULATORY_CARE_PROVIDER_SITE_OTHER): Payer: Commercial Managed Care - HMO | Admitting: Internal Medicine

## 2016-08-14 VITALS — BP 118/78 | HR 81 | Resp 16 | Ht 67.5 in | Wt 328.0 lb

## 2016-08-14 DIAGNOSIS — M4800 Spinal stenosis, site unspecified: Secondary | ICD-10-CM | POA: Diagnosis not present

## 2016-08-14 DIAGNOSIS — G4733 Obstructive sleep apnea (adult) (pediatric): Secondary | ICD-10-CM

## 2016-08-14 DIAGNOSIS — M5137 Other intervertebral disc degeneration, lumbosacral region: Secondary | ICD-10-CM

## 2016-08-14 DIAGNOSIS — Z23 Encounter for immunization: Secondary | ICD-10-CM

## 2016-08-14 NOTE — Progress Notes (Signed)
Date:  08/14/2016   Name:  Kimberly Cowan   DOB:  1965/10/31   MRN:  [id number]   Chief Complaint: CPAP (Patient uses machine each night. She does feel it is helping but sometimes irritates nasal passage. )  OSA - now on CPAP nightly and here to discuss compliance. She has been using it nightly since August.  She averages about 4 hours per night.  She often removes it due to dry nasal dryness despite humidifier.  Query of the machine shows 4.1 hours average per night.  Mask seal is good. She feels that she is sleeping well and improving.  We adjusted the humidity level to maximum.  Review of Systems  Constitutional: Negative for chills, fatigue and fever.  Eyes: Positive for visual disturbance (new dx of glaucoma).  Respiratory: Negative for choking and shortness of breath.   Cardiovascular: Negative for chest pain.  Skin: Negative for color change and rash.  Neurological: Negative for dizziness, tremors, weakness and headaches.    Patient Active Problem List   Diagnosis Date Noted  . B12 nutritional deficiency 08/05/2016  . Obstructive sleep apnea of adult 03/04/2016  . DDD (degenerative disc disease), lumbosacral 08/14/2015  . Bilateral lumbar radiculopathy 08/14/2015  . S/P spinal surgery 08/14/2015  . Status post gastric bypass for obesity 07/04/2015  . Hyperglycemia 07/01/2015  . Spinal stenosis, lumbar region, with neurogenic claudication 11/11/2012  . Clinical depression 04/04/2012  . Headache, migraine 04/04/2012    Prior to Admission medications   Medication Sig Start Date End Date Taking? Authorizing Provider  Cetirizine HCl 10 MG CAPS Take 1 capsule (10 mg total) by mouth daily. 01/16/16  Yes Cari B Triplett, FNP  DULoxetine (CYMBALTA) 30 MG capsule Take 30 mg by mouth 2 (two) times daily.  02/05/16  Yes Historical Provider, MD  fluticasone (FLONASE) 50 MCG/ACT nasal spray Place 2 sprays into both nostrils daily. 04/22/16  Yes Glean Hess, MD    gabapentin (NEURONTIN) 300 MG capsule Take 600 mg by mouth at bedtime.  01/01/16  Yes Historical Provider, MD  latanoprost (XALATAN) 0.005 % ophthalmic solution 1 drop at bedtime.   Yes Historical Provider, MD  oxyCODONE-acetaminophen (PERCOCET/ROXICET) 5-325 MG tablet Take 1-2 tablets by mouth every 4 (four) hours as needed for moderate pain. 03/21/16  Yes Kevan Ny Ditty, MD  rizatriptan (MAXALT-MLT) 10 MG disintegrating tablet TAKE 1 TABLET BY MOUTH AS NEEDED FOR MIGRAINE. MAY REPEAT IN 2 HOURS IF NEEDED. 04/19/16  Yes Glean Hess, MD    Allergies  Allergen Reactions  . Morphine And Related Hives, Shortness Of Breath, Nausea And Vomiting and Other (See Comments)    "can't breathe"  . Penicillins Anaphylaxis, Hives, Shortness Of Breath and Nausea And Vomiting    "can't breathe", Has patient had a PCN reaction causing immediate rash, facial/tongue/throat swelling, SOB or lightheadedness with hypotension: Yes Has patient had a PCN reaction causing severe rash involving mucus membranes or skin necrosis: No Has patient had a PCN reaction that required hospitalization No Has patient had a PCN reaction occurring within the last 10 years: No If all of the above answers are "NO", then may proceed with Cephalosporin use.   . Latex Hives  . Baclofen Nausea Only    Jittery, anxious    Past Surgical History:  Procedure Laterality Date  . ABDOMINAL HYSTERECTOMY  10/2006   cervix and ovaries remain  . BACK SURGERY  2014   lumbar fusion  . BACK SURGERY  2009  discectomy  . BREAST REDUCTION SURGERY    . c sections    . GASTRIC BYPASS  2005  . REDUCTION MAMMAPLASTY Bilateral    2001  . SPINAL CORD STIMULATOR INSERTION N/A 03/20/2016   Procedure: LUMBAR SPINAL CORD STIMULATOR INSERTION;  Surgeon: Eustace Moore, MD;  Location: [place] [facility];  Service: Neurosurgery;  Laterality: N/A;  . TONSILLECTOMY      Social History  Substance Use Topics  . Smoking status: Never Smoker  . Smokeless  tobacco: Never Used  . Alcohol use No     Medication list has been reviewed and updated.   Physical Exam  Constitutional: She is oriented to person, place, and time. She appears well-developed. No distress.  HENT:  Head: Normocephalic and atraumatic.  Cardiovascular: Normal rate, regular rhythm and normal heart sounds.   Pulmonary/Chest: Effort normal and breath sounds normal. No respiratory distress. She has no wheezes.  Musculoskeletal: She exhibits no edema.  Neurological: She is alert and oriented to person, place, and time.  Skin: Skin is warm and dry. No rash noted.  Psychiatric: She has a normal mood and affect. Her behavior is normal. Thought content normal.  Nursing note and vitals reviewed.   BP 118/78   Pulse 81   Resp 16   Ht 5' 7.5" (1.715 m)   Wt (!) 328 lb (148.8 kg)   SpO2 98%   BMI 50.61 kg/m   Assessment and Plan: 1. Obstructive sleep apnea of adult Good CPAP compliance Increase humidity to maximum  2. DDD (degenerative disc disease), lumbosacral Doing well post spinal stimulator implant   Halina Maidens, MD [facility]  08/14/2016

## 2016-08-19 ENCOUNTER — Emergency Department
Admission: EM | Admit: 2016-08-19 | Discharge: 2016-08-19 | Disposition: A | Discharge status: Home / Self Care | Payer: Commercial Managed Care - HMO | Attending: Emergency Medicine | Admitting: Emergency Medicine

## 2016-08-19 ENCOUNTER — Encounter: Payer: Self-pay | Admitting: Emergency Medicine

## 2016-08-19 DIAGNOSIS — H10021 Other mucopurulent conjunctivitis, right eye: Secondary | ICD-10-CM | POA: Diagnosis not present

## 2016-08-19 DIAGNOSIS — Z79899 Other long term (current) drug therapy: Secondary | ICD-10-CM | POA: Insufficient documentation

## 2016-08-19 DIAGNOSIS — Z91040 Latex allergy status: Secondary | ICD-10-CM | POA: Diagnosis not present

## 2016-08-19 DIAGNOSIS — M436 Torticollis: Secondary | ICD-10-CM | POA: Diagnosis not present

## 2016-08-19 DIAGNOSIS — M542 Cervicalgia: Secondary | ICD-10-CM | POA: Diagnosis present

## 2016-08-19 MED ORDER — CYCLOBENZAPRINE HCL 10 MG PO TABS: 10.0000 mg | ORAL_TABLET | Freq: Three times a day (TID) | ORAL | 0 refills | Status: DC | PRN

## 2016-08-19 MED ORDER — GENTAMICIN SULFATE 0.3 % OP SOLN: 1.0000 [drp] | OPHTHALMIC | 0 refills | Status: DC

## 2016-08-19 MED ORDER — TRAMADOL HCL 50 MG PO TABS: 50.0000 mg | ORAL_TABLET | Freq: Four times a day (QID) | ORAL | 0 refills | Status: DC | PRN

## 2016-08-19 NOTE — ED Triage Notes (Addendum)
Pt reports neck pain on right side with shoulder pain starting yesterday. Pain worse when moves neck and if lifts arm moving shoulder. Also c/o pain to right eye. Has had a little drainage from eye.appears well in triage. Ambulatory, NAD. Reports is cooking big dinner tomorrow so needs to get feeling better.

## 2016-08-19 NOTE — ED Notes (Signed)
Pt reports that she turned her head yesterday and began to feel pain in her neck on the right side that has been constant. She states that she did not sleep last night due to the pain, even after taking a percocet she has for chronic back pain. Pt also reports recent glaucoma diagnosis, and has had pain and burning in her right eye. She used drops in it last night and got 20 minutes of relief; awakened this am with discharge. No redness noted to eye during assessment.

## 2016-08-19 NOTE — ED Provider Notes (Signed)
[facility] Emergency Department Provider Note   ____________________________________________   First MD Initiated Contact with Patient 08/19/16 [phone]     (approximate)  I have reviewed the triage vital signs and the nursing notes.   HISTORY  Chief Complaint Neck Pain    HPI Kimberly Cowan is a 50 y.o. female patient complaining of right neck pain radiating to his shoulder started yesterday. Patient states she was having a conversation which he turned abruptly to the right volar catching her neck. Patient state pain also increases with overhead reaching other right upper extremity. Patient also complaining of right eyelid being matted together upon awakening this morning. Patient states she is recently diagnosed with glaucoma adhesive dressing to the complaint.Patient rates the pain as a 9/10. She describes the neck pain as constant and stabbing and describes the pain as achy. Patient denies further vision loss or eye complaint. Patient states she received some mild relief using her eyedrops for glaucoma. Patient stated mild transient relief with hydrocodone tablets left over from a previous prescription but did not have any more of that medication.   Past Medical History:  Diagnosis Date  . Anxiety   . Arthritis    lower spine  . Chronic back pain   . Clinical depression 04/04/2012  . Depression   . Headache(784.0)    migraines  . Pneumonia    walking pneumonia  . Sleep apnea    no cpcap     last sleep study > 4 yrs    Patient Active Problem List   Diagnosis Date Noted  . B12 nutritional deficiency 08/05/2016  . Obstructive sleep apnea of adult 03/04/2016  . DDD (degenerative disc disease), lumbosacral 08/14/2015  . Bilateral lumbar radiculopathy 08/14/2015  . S/P spinal surgery 08/14/2015  . Status post gastric bypass for obesity 07/04/2015  . Hyperglycemia 07/01/2015  . Spinal stenosis, lumbar region, with neurogenic claudication  11/11/2012  . Clinical depression 04/04/2012  . Headache, migraine 04/04/2012    Past Surgical History:  Procedure Laterality Date  . ABDOMINAL HYSTERECTOMY  10/2006   cervix and ovaries remain  . BACK SURGERY  2014   lumbar fusion  . BACK SURGERY  2009   discectomy  . BREAST REDUCTION SURGERY    . c sections    . GASTRIC BYPASS  2005  . REDUCTION MAMMAPLASTY Bilateral    2001  . SPINAL CORD STIMULATOR INSERTION N/A 03/20/2016   Procedure: LUMBAR SPINAL CORD STIMULATOR INSERTION;  Surgeon: Eustace Moore, MD;  Location: [place] [facility];  Service: Neurosurgery;  Laterality: N/A;  . TONSILLECTOMY      Prior to Admission medications   Medication Sig Start Date End Date Taking? Authorizing Provider  Cetirizine HCl 10 MG CAPS Take 1 capsule (10 mg total) by mouth daily. 01/16/16   Victorino Dike, FNP  cyclobenzaprine (FLEXERIL) 10 MG tablet Take 1 tablet (10 mg total) by mouth 3 (three) times daily as needed. 08/19/16   Sable Feil, PA-C  DULoxetine (CYMBALTA) 30 MG capsule Take 30 mg by mouth 2 (two) times daily.  02/05/16   Historical Provider, MD  fluticasone (FLONASE) 50 MCG/ACT nasal spray Place 2 sprays into both nostrils daily. 04/22/16   Glean Hess, MD  gabapentin (NEURONTIN) 300 MG capsule Take 600 mg by mouth at bedtime.  01/01/16   Historical Provider, MD  gentamicin (GARAMYCIN) 0.3 % ophthalmic solution Place 1 drop into both eyes every 4 (four) hours. 08/19/16   Hinda Lenis  Pecola Haxton, PA-C  latanoprost (XALATAN) 0.005 % ophthalmic solution 1 drop at bedtime.    Historical Provider, MD  oxyCODONE-acetaminophen (PERCOCET/ROXICET) 5-325 MG tablet Take 1-2 tablets by mouth every 4 (four) hours as needed for moderate pain. 03/21/16   Kevan Ny Ditty, MD  rizatriptan (MAXALT-MLT) 10 MG disintegrating tablet TAKE 1 TABLET BY MOUTH AS NEEDED FOR MIGRAINE. MAY REPEAT IN 2 HOURS IF NEEDED. 04/19/16   Glean Hess, MD  traMADol (ULTRAM) 50 MG tablet Take 1 tablet (50 mg total) by mouth  every 6 (six) hours as needed. 08/19/16 08/19/17  Sable Feil, PA-C    Allergies Morphine and related; Penicillins; Latex; and Baclofen  Family History  Problem Relation Age of Onset  . Diabetes Mother   . Hypertension Mother   . Diabetes Sister   . Hypertension Father   . Hypertension Sister   . Breast cancer Neg Hx     Social History Social History  Substance Use Topics  . Smoking status: Never Smoker  . Smokeless tobacco: Never Used  . Alcohol use No    Review of Systems Constitutional: No fever/chills Eyes: No visual changes. ENT: No sore throat. Cardiovascular: Denies chest pain. Respiratory: Denies shortness of breath. Gastrointestinal: No abdominal pain.  No nausea, no vomiting.  No diarrhea.  No constipation. Genitourinary: Negative for dysuria. Musculoskeletal: Negative for back pain. Skin: Negative for rash. Neurological: Negative for headaches, focal weakness or numbness. Psychiatric:Depression and anxiety. Allergic/Immunilogical: See medication list  ____________________________________________   PHYSICAL EXAM:  VITAL SIGNS: ED Triage Vitals  Enc Vitals Group     BP 08/19/16 0908 (!) 158/78     Pulse Rate 08/19/16 0908 87     Resp 08/19/16 0908 20     Temp 08/19/16 0908 98.5 F (36.9 C)     Temp Source 08/19/16 0908 Oral     SpO2 08/19/16 0908 99 %     Weight 08/19/16 0909 (!) 330 lb (149.7 kg)     Height 08/19/16 0909 5\' 7"  (1.702 m)     Head Circumference --      Peak Flow --      Pain Score 08/19/16 0909 9     Pain Loc --      Pain Edu? --      Excl. in [place]? --     Constitutional: Alert and oriented. Well appearing and in no acute distress. Eyes: Erythematous conjunctiva the right.Marland Kitchen PERRL. EOMI. matted eyelids. Head: Atraumatic. Nose: No congestion/rhinnorhea. Mouth/Throat: Mucous membranes are moist.  Oropharynx non-erythematous. Neck: No stridor.  No cervical spine tenderness to palpation.Decreased range of motion right lateral  and flexion movements. Hematological/Lymphatic/Immunilogical: No cervical lymphadenopathy. Cardiovascular: Normal rate, regular rhythm. Grossly normal heart sounds.  Good peripheral circulation. Respiratory: Normal respiratory effort.  No retractions. Lungs CTAB. Gastrointestinal: Soft and nontender. No distention. No abdominal bruits. No CVA tenderness. Musculoskeletal: No lower extremity tenderness nor edema.  No joint effusions. Neurologic:  Normal speech and language. No gross focal neurologic deficits are appreciated. No gait instability. Skin:  Skin is warm, dry and intact. No rash noted. Psychiatric: Mood and affect are normal. Speech and behavior are normal.  ____________________________________________   LABS (all labs ordered are listed, but only abnormal results are displayed)  Labs Reviewed - No data to display ____________________________________________  EKG   ____________________________________________  RADIOLOGY   ____________________________________________   PROCEDURES  Procedure(s) performed: None  Procedures  Critical Care performed: No  ____________________________________________   INITIAL IMPRESSION / ASSESSMENT AND PLAN / ED  COURSE  Pertinent labs & imaging results that were available during my care of the patient were reviewed by me and considered in my medical decision making (see chart for details).  Torticollis and conjunctivitis. Patient given discharge care instructions. Patient given prescription for Flexeril and tramadol. Patient also given gentamicin eyedrops. Discussed with patient to avoid contact of the eyedrop bottles with eyelashes.  Clinical Course      ____________________________________________   FINAL CLINICAL IMPRESSION(S) / ED DIAGNOSES  Final diagnoses:  Torticollis, acute  Other mucopurulent conjunctivitis of right eye      NEW MEDICATIONS STARTED DURING THIS VISIT:  New Prescriptions   CYCLOBENZAPRINE  (FLEXERIL) 10 MG TABLET    Take 1 tablet (10 mg total) by mouth 3 (three) times daily as needed.   GENTAMICIN (GARAMYCIN) 0.3 % OPHTHALMIC SOLUTION    Place 1 drop into both eyes every 4 (four) hours.   TRAMADOL (ULTRAM) 50 MG TABLET    Take 1 tablet (50 mg total) by mouth every 6 (six) hours as needed.     Note:  This document was prepared using Dragon voice recognition software and may include unintentional dictation errors.    Sable Feil, PA-C 08/19/16 [id number]    Daymon Larsen, MD 08/19/16 1049

## 2016-08-19 NOTE — ED Notes (Signed)
Pt discharged home after verbalizing understanding of discharge instructions; nad noted. 

## 2016-08-31 ENCOUNTER — Encounter: Payer: Self-pay | Admitting: Emergency Medicine

## 2016-08-31 ENCOUNTER — Emergency Department: Payer: Commercial Managed Care - HMO

## 2016-08-31 ENCOUNTER — Emergency Department
Admission: EM | Admit: 2016-08-31 | Discharge: 2016-09-01 | Disposition: A | Discharge status: Home / Self Care | Payer: Commercial Managed Care - HMO | Attending: Emergency Medicine | Admitting: Emergency Medicine

## 2016-08-31 DIAGNOSIS — R1031 Right lower quadrant pain: Secondary | ICD-10-CM | POA: Diagnosis not present

## 2016-08-31 DIAGNOSIS — Z79899 Other long term (current) drug therapy: Secondary | ICD-10-CM | POA: Diagnosis not present

## 2016-08-31 DIAGNOSIS — R110 Nausea: Secondary | ICD-10-CM

## 2016-08-31 DIAGNOSIS — N83201 Unspecified ovarian cyst, right side: Secondary | ICD-10-CM | POA: Diagnosis not present

## 2016-08-31 DIAGNOSIS — Z91040 Latex allergy status: Secondary | ICD-10-CM | POA: Diagnosis not present

## 2016-08-31 DIAGNOSIS — R109 Unspecified abdominal pain: Secondary | ICD-10-CM

## 2016-08-31 DIAGNOSIS — R102 Pelvic and perineal pain: Secondary | ICD-10-CM

## 2016-08-31 DIAGNOSIS — R350 Frequency of micturition: Secondary | ICD-10-CM | POA: Diagnosis not present

## 2016-08-31 DIAGNOSIS — N390 Urinary tract infection, site not specified: Secondary | ICD-10-CM | POA: Diagnosis not present

## 2016-08-31 LAB — URINALYSIS COMPLETE WITH MICROSCOPIC (ARMC ONLY)
Bilirubin Urine: NEGATIVE
GLUCOSE, UA: NEGATIVE mg/dL
HGB URINE DIPSTICK: NEGATIVE
Ketones, ur: NEGATIVE mg/dL
NITRITE: NEGATIVE
PROTEIN: NEGATIVE mg/dL
SPECIFIC GRAVITY, URINE: 1.011 (ref 1.005–1.030)
pH: 6 (ref 5.0–8.0)

## 2016-08-31 LAB — CBC WITH DIFFERENTIAL/PLATELET
BASOS ABS: 0 10*3/uL (ref 0–0.1)
BASOS PCT: 1 %
Eosinophils Absolute: 0.1 10*3/uL (ref 0–0.7)
Eosinophils Relative: 2 %
HEMATOCRIT: 36.3 % (ref 35.0–47.0)
HEMOGLOBIN: 11.9 g/dL — AB (ref 12.0–16.0)
LYMPHS PCT: 45 %
Lymphs Abs: 2.3 10*3/uL (ref 1.0–3.6)
MCH: 26.7 pg (ref 26.0–34.0)
MCHC: 32.7 g/dL (ref 32.0–36.0)
MCV: 81.8 fL (ref 80.0–100.0)
MONO ABS: 0.4 10*3/uL (ref 0.2–0.9)
Monocytes Relative: 7 %
NEUTROS ABS: 2.3 10*3/uL (ref 1.4–6.5)
NEUTROS PCT: 45 %
Platelets: 229 10*3/uL (ref 150–440)
RBC: 4.44 MIL/uL (ref 3.80–5.20)
RDW: 16.2 % — AB (ref 11.5–14.5)
WBC: 5.2 10*3/uL (ref 3.6–11.0)

## 2016-08-31 LAB — TROPONIN I: Troponin I: <0.03 ng/mL (ref <0.03)

## 2016-08-31 LAB — COMPREHENSIVE METABOLIC PANEL
ALBUMIN: 3.4 g/dL — AB (ref 3.5–5.0)
ALT: 13 U/L — AB (ref 14–54)
AST: 18 U/L (ref 15–41)
Alkaline Phosphatase: 81 U/L (ref 38–126)
Anion gap: 6 (ref 5–15)
BILIRUBIN TOTAL: 0.4 mg/dL (ref 0.3–1.2)
BUN: 8 mg/dL (ref 6–20)
CO2: 26 mmol/L (ref 22–32)
CREATININE: 0.72 mg/dL (ref 0.44–1.00)
Calcium: 9.1 mg/dL (ref 8.9–10.3)
Chloride: 104 mmol/L (ref 101–111)
GFR calc Af Amer: >60 mL/min (ref >60)
GLUCOSE: 90 mg/dL (ref 65–99)
POTASSIUM: 3.5 mmol/L (ref 3.5–5.1)
Sodium: 136 mmol/L (ref 135–145)
TOTAL PROTEIN: 7.6 g/dL (ref 6.5–8.1)

## 2016-08-31 LAB — LIPASE, BLOOD: LIPASE: 25 U/L (ref 11–51)

## 2016-08-31 MED ORDER — IOPAMIDOL (ISOVUE-300) INJECTION 61%
30.0000 mL | Freq: Once | INTRAVENOUS | Status: AC | PRN
  Administered 2016-08-31: 30 mL via ORAL
  Filled 2016-08-31: qty 30

## 2016-08-31 MED ORDER — ONDANSETRON HCL 4 MG/2ML IJ SOLN
4.0000 mg | Freq: Once | INTRAMUSCULAR | Status: AC
  Administered 2016-08-31: 4 mg via INTRAVENOUS
  Filled 2016-08-31: qty 2

## 2016-08-31 MED ORDER — FENTANYL CITRATE (PF) 100 MCG/2ML IJ SOLN
50.0000 ug | Freq: Once | INTRAMUSCULAR | Status: AC
  Administered 2016-08-31: 50 ug via INTRAVENOUS
  Filled 2016-08-31: qty 2

## 2016-08-31 MED ORDER — IOPAMIDOL (ISOVUE-300) INJECTION 61%
125.0000 mL | Freq: Once | INTRAVENOUS | Status: AC | PRN
  Administered 2016-08-31: 125 mL via INTRAVENOUS
  Filled 2016-08-31: qty 150

## 2016-08-31 MED ORDER — SODIUM CHLORIDE 0.9 % IV BOLUS (SEPSIS)
1000.0000 mL | Freq: Once | INTRAVENOUS | Status: AC
  Administered 2016-08-31: 1000 mL via INTRAVENOUS

## 2016-08-31 MED ORDER — ONDANSETRON HCL 4 MG/2ML IJ SOLN
INTRAMUSCULAR | Status: AC
  Administered 2016-08-31: 4 mg via INTRAVENOUS
  Filled 2016-08-31: qty 2

## 2016-08-31 NOTE — ED Notes (Signed)
Pt states she was sent from urgent care for RLQ abd pain, states tenderness is progressive throughout the day, denies any nausea or vomiting, pt awake and alert in no acute distress

## 2016-08-31 NOTE — ED Triage Notes (Signed)
C/o right side abd pain onset this am. nausea and diarrhea.

## 2016-08-31 NOTE — ED Provider Notes (Signed)
[facility] Emergency Department Provider Note  ____________________________________________   First MD Initiated Contact with Patient 08/31/16 1955     (approximate)  I have reviewed the triage vital signs and the nursing notes.   HISTORY  Chief Complaint Abdominal Pain   HPI Kimberly Cowan is a 50 y.o. female who is presenting to the emergency department today with right abdominal pain as well as nausea since this morning. Says the pain is a 7 and 8 out of 10 and cramping. She denies any diarrhea. Denies any vomiting. Denies any burning with urination. Reports a history of a hysterectomy. Denies any radiation of the back. Denies any chest pain or shortness of breath.   Past Medical History:  Diagnosis Date  . Anxiety   . Arthritis    lower spine  . Chronic back pain   . Clinical depression 04/04/2012  . Depression   . Headache(784.0)    migraines  . Pneumonia    walking pneumonia  . Sleep apnea    no cpcap     last sleep study > 4 yrs    Patient Active Problem List   Diagnosis Date Noted  . B12 nutritional deficiency 08/05/2016  . Obstructive sleep apnea of adult 03/04/2016  . DDD (degenerative disc disease), lumbosacral 08/14/2015  . Bilateral lumbar radiculopathy 08/14/2015  . S/P spinal surgery 08/14/2015  . Status post gastric bypass for obesity 07/04/2015  . Hyperglycemia 07/01/2015  . Spinal stenosis, lumbar region, with neurogenic claudication 11/11/2012  . Clinical depression 04/04/2012  . Headache, migraine 04/04/2012    Past Surgical History:  Procedure Laterality Date  . ABDOMINAL HYSTERECTOMY  10/2006   cervix and ovaries remain  . BACK SURGERY  2014   lumbar fusion  . BACK SURGERY  2009   discectomy  . BREAST REDUCTION SURGERY    . c sections    . GASTRIC BYPASS  2005  . REDUCTION MAMMAPLASTY Bilateral    2001  . SPINAL CORD STIMULATOR INSERTION N/A 03/20/2016   Procedure: LUMBAR SPINAL CORD STIMULATOR  INSERTION;  Surgeon: Eustace Moore, MD;  Location: [place] [facility];  Service: Neurosurgery;  Laterality: N/A;  . TONSILLECTOMY      Prior to Admission medications   Medication Sig Start Date End Date Taking? Authorizing Provider  Cetirizine HCl 10 MG CAPS Take 1 capsule (10 mg total) by mouth daily. 01/16/16   Victorino Dike, FNP  cyclobenzaprine (FLEXERIL) 10 MG tablet Take 1 tablet (10 mg total) by mouth 3 (three) times daily as needed. 08/19/16   Sable Feil, PA-C  DULoxetine (CYMBALTA) 30 MG capsule Take 30 mg by mouth 2 (two) times daily.  02/05/16   Historical Provider, MD  fluticasone (FLONASE) 50 MCG/ACT nasal spray Place 2 sprays into both nostrils daily. 04/22/16   Glean Hess, MD  gabapentin (NEURONTIN) 300 MG capsule Take 600 mg by mouth at bedtime.  01/01/16   Historical Provider, MD  gentamicin (GARAMYCIN) 0.3 % ophthalmic solution Place 1 drop into both eyes every 4 (four) hours. 08/19/16   Sable Feil, PA-C  latanoprost (XALATAN) 0.005 % ophthalmic solution 1 drop at bedtime.    Historical Provider, MD  oxyCODONE-acetaminophen (PERCOCET/ROXICET) 5-325 MG tablet Take 1-2 tablets by mouth every 4 (four) hours as needed for moderate pain. 03/21/16   Kevan Ny Ditty, MD  rizatriptan (MAXALT-MLT) 10 MG disintegrating tablet TAKE 1 TABLET BY MOUTH AS NEEDED FOR MIGRAINE. MAY REPEAT IN 2 HOURS IF NEEDED. 04/19/16  Glean Hess, MD  traMADol (ULTRAM) 50 MG tablet Take 1 tablet (50 mg total) by mouth every 6 (six) hours as needed. 08/19/16 08/19/17  Sable Feil, PA-C    Allergies Morphine and related; Penicillins; Latex; and Baclofen  Family History  Problem Relation Age of Onset  . Diabetes Mother   . Hypertension Mother   . Diabetes Sister   . Hypertension Father   . Hypertension Sister   . Breast cancer Neg Hx     Social History Social History  Substance Use Topics  . Smoking status: Never Smoker  . Smokeless tobacco: Never Used  . Alcohol use No     Review of Systems Constitutional: No fever/chills Eyes: No visual changes. ENT: No sore throat. Cardiovascular: Denies chest pain. Respiratory: Denies shortness of breath. Gastrointestinal: no vomiting.  No diarrhea.  No constipation. Genitourinary: Negative for dysuria. Musculoskeletal: Negative for back pain. Skin: Negative for rash. Neurological: Negative for headaches, focal weakness or numbness.  10-point ROS otherwise negative.  ____________________________________________   PHYSICAL EXAM:  VITAL SIGNS: ED Triage Vitals  Enc Vitals Group     BP 08/31/16 1739 121/74     Pulse Rate 08/31/16 1739 92     Resp 08/31/16 1739 20     Temp 08/31/16 1739 98.4 F (36.9 C)     Temp Source 08/31/16 1739 Oral     SpO2 08/31/16 1739 99 %     Weight 08/31/16 1740 (!) 330 lb (149.7 kg)     Height 08/31/16 1740 5\' 7"  (1.702 m)     Head Circumference --      Peak Flow --      Pain Score 08/31/16 1740 10     Pain Loc --      Pain Edu? --      Excl. in [place]? --     Constitutional: Alert and oriented. Well appearing and in no acute distress. Eyes: Conjunctivae are normal. PERRL. EOMI. Head: Atraumatic. Nose: No congestion/rhinnorhea. Mouth/Throat: Mucous membranes are moist.   Neck: No stridor.   Cardiovascular: Normal rate, regular rhythm. Grossly normal heart sounds.  Respiratory: Normal respiratory effort.  No retractions. Lungs CTAB. Gastrointestinal: Soft With right upper as well as right lower quadrant abdominal pain with a negative Murphy sign. No guarding. No distention. No CVA tenderness. Musculoskeletal: No lower extremity tenderness nor edema.  No joint effusions. Neurologic:  Normal speech and language. No gross focal neurologic deficits are appreciated. Skin:  Skin is warm, dry and intact. No rash noted. Psychiatric: Mood and affect are normal. Speech and behavior are normal.  ____________________________________________   LABS (all labs ordered are listed,  but only abnormal results are displayed)  Labs Reviewed  CBC WITH DIFFERENTIAL/PLATELET - Abnormal; Notable for the following:       Result Value   Hemoglobin 11.9 (*)    RDW 16.2 (*)    All other components within normal limits  COMPREHENSIVE METABOLIC PANEL - Abnormal; Notable for the following:    Albumin 3.4 (*)    ALT 13 (*)    All other components within normal limits  LIPASE, BLOOD  TROPONIN I   ____________________________________________  EKG  ED ECG REPORT I, Donise Woodle,  Youlanda Roys, the attending physician, personally viewed and interpreted this ECG.   Date: 08/31/2016  EKG Time: 1746  Rate: 93  Rhythm: normal sinus rhythm  Axis: Normal axis  Intervals:none  ST&T Change: No ST segment elevation or depression. No abnormal T-wave inversion.  ____________________________________________  [id number]  CT Abdomen Pelvis W Contrast (Final result)  Result time 08/31/16 22:36:47  Final result by Massie Kluver, MD (08/31/16 22:36:47)           Narrative:   CLINICAL DATA: Right lower quadrant abdominal pain and tenderness  EXAM: CT ABDOMEN AND PELVIS WITH CONTRAST  TECHNIQUE: Multidetector CT imaging of the abdomen and pelvis was performed using the standard protocol following bolus administration of intravenous contrast.  CONTRAST: 167mL ISOVUE-300 IOPAMIDOL (ISOVUE-300) INJECTION 61%  COMPARISON: CT lumbar spine which includes retroperitoneum from 08/12/2015 and 03/20/2014  FINDINGS: Lower chest: Right middle and bilateral lower lobe atelectasis and/or scarring. Normal visualized cardiac chamber size. No pericardial effusion.  Hepatobiliary: No focal liver abnormality is seen. No gallstones, gallbladder wall thickening, or biliary dilatation.  Pancreas: Unremarkable. No pancreatic ductal dilatation or surrounding inflammatory changes.  Spleen: Normal in size without focal abnormality.  Adrenals/Urinary Tract: Adrenal glands are unremarkable.  Kidneys are normal, without renal calculi, focal lesion, or hydronephrosis. Bladder is unremarkable. Repeat images through the kidneys demonstrate no obstruction to the antegrade flow opacified urine.  Stomach/Bowel: Status post bariatric surgery. No contrast extravasation or leak. No dilatation of the excluded stomach. Normal bowel rotation. No acute inflammation or bowel obstruction. Normal appearing appendix.  Vascular/Lymphatic: Phleboliths are seen in the pelvis bilaterally. No abdominal aortic aneurysm or dissection. No lymphadenopathy.  Reproductive: Hysterectomy. There are 2 new right ovarian simple cysts measuring 2 cm and 3.3 cm in diameter. Ascites.  Other: Small fat containing umbilical hernia. No ascites or free air.  Musculoskeletal: L5-S1 posterior lumbar fusion. T7 through T8 neural stimulator device tip location along the posterior spinal canal.  IMPRESSION: 1. Normal-appearing appendix. No nephrolithiasis nor hydroureteronephrosis. 2. Two simple appearing right ovarian follicles/cysts are noted possibly contributing to the patient's pain, the largest is 3.3 cm. 3. Neurostimulator lead seen at the T7-T8 level. PLIF at L5/S1. 4. Hysterectomy. 5. Gastric bypass.   Electronically Signed By: Ashley Royalty M.D. On: 08/31/2016 22:36            ____________________________________________   PROCEDURES  Procedure(s) performed:   Procedures  Critical Care performed:   ____________________________________________   INITIAL IMPRESSION / ASSESSMENT AND PLAN / ED COURSE  Pertinent labs & imaging results that were available during my care of the patient were reviewed by me and considered in my medical decision making (see chart for details).   Clinical Course   ----------------------------------------- 10:47 PM on 08/31/2016 -----------------------------------------  Patient with persistent pain and asking for second dose of pain medication. I  reviewed the labs as well as imaging with the patient and we will proceed with ultrasound of the pelvis secondary to the 2 cysts as well as some ascites surrounding them. The patient understands plan and is willing to comply. Patient transferred due to C pod closing over to the main emergency department side. Signed out to Dr. Archie Balboa. Patient able to tolerate by mouth contrast.   ____________________________________________   FINAL CLINICAL IMPRESSION(S) / ED DIAGNOSES  Right-sided abdominal pain. Ovarian cyst. Nausea.    NEW MEDICATIONS STARTED DURING THIS VISIT:  New Prescriptions   No medications on file     Note:  This document was prepared using Dragon voice recognition software and may include unintentional dictation errors.    Orbie Pyo, MD 08/31/16 [phone]

## 2016-08-31 NOTE — ED Notes (Signed)
pT HAS ROUX-N-Y. Verified w/ CT and EDP that administration of oral contrast was appropriate. CT tech called Radiology and told me that she was able to take as much as possible w/o vomiting. Pt states she is able to vomit, despite bariatric surgery.

## 2016-09-01 ENCOUNTER — Other Ambulatory Visit: Payer: Self-pay | Admitting: Internal Medicine

## 2016-09-01 ENCOUNTER — Ambulatory Visit: Payer: Commercial Managed Care - HMO | Admitting: Internal Medicine

## 2016-09-01 DIAGNOSIS — N83201 Unspecified ovarian cyst, right side: Secondary | ICD-10-CM | POA: Diagnosis not present

## 2016-09-01 DIAGNOSIS — R102 Pelvic and perineal pain: Secondary | ICD-10-CM

## 2016-09-01 DIAGNOSIS — N83202 Unspecified ovarian cyst, left side: Secondary | ICD-10-CM

## 2016-09-01 MED ORDER — SULFAMETHOXAZOLE-TRIMETHOPRIM 800-160 MG PO TABS: 1.0000 | ORAL_TABLET | Freq: Two times a day (BID) | ORAL | 0 refills | Status: DC

## 2016-09-01 MED ORDER — IBUPROFEN 800 MG PO TABS: 800.0000 mg | ORAL_TABLET | Freq: Three times a day (TID) | ORAL | 0 refills | Status: DC | PRN

## 2016-09-01 MED ORDER — OXYCODONE-ACETAMINOPHEN 5-325 MG PO TABS: 1.0000 | ORAL_TABLET | ORAL | 0 refills | Status: DC | PRN

## 2016-09-01 MED ORDER — SULFAMETHOXAZOLE-TRIMETHOPRIM 800-160 MG PO TABS
1.0000 | ORAL_TABLET | Freq: Once | ORAL | Status: AC
  Administered 2016-09-01: 1 via ORAL
  Filled 2016-09-01: qty 1

## 2016-09-01 NOTE — Discharge Instructions (Signed)
1. Take antibiotic as prescribed (Septra DS twice daily 7 days). 2. You may take pain medicines as needed (Motrin/Percocet). 3. Return to the ER for worsened symptoms, persistent vomiting, difficulty breathing or other concerns.

## 2016-09-01 NOTE — ED Provider Notes (Signed)
-----------------------------------------   12:06 AM on 09/01/2016 -----------------------------------------  Ultrasound pelvis interpreted per Dr. Marisue Humble: Two adjacent mildly complex right ovarian cysts, larger measuring  3.6 cm. There are no suspicious imaging characteristics, however  given right-sided pain, could consider follow-up pelvic ultrasound  in 6-12 weeks.   Updated patient on ultrasound results. Will place on analgesia to use as needed antibiotic for UTI and she will follow up with GYN in 1-2 weeks. Strict return precautions given. Patient verbalizes understanding and agrees with plan of care.   Paulette Blanch, MD 09/01/16 [phone]

## 2016-09-04 ENCOUNTER — Ambulatory Visit (INDEPENDENT_AMBULATORY_CARE_PROVIDER_SITE_OTHER): Payer: Commercial Managed Care - HMO

## 2016-09-04 VITALS — Resp 16 | Ht 67.0 in | Wt 330.0 lb

## 2016-09-04 DIAGNOSIS — E538 Deficiency of other specified B group vitamins: Secondary | ICD-10-CM | POA: Diagnosis not present

## 2016-09-04 MED ORDER — CYANOCOBALAMIN 1000 MCG/ML IJ SOLN
1000.0000 ug | Freq: Once | INTRAMUSCULAR | Status: AC
  Administered 2016-09-04: 1000 ug via INTRAMUSCULAR

## 2016-09-08 DIAGNOSIS — G4733 Obstructive sleep apnea (adult) (pediatric): Secondary | ICD-10-CM | POA: Diagnosis not present

## 2016-09-08 DIAGNOSIS — M4800 Spinal stenosis, site unspecified: Secondary | ICD-10-CM | POA: Diagnosis not present

## 2016-09-10 ENCOUNTER — Encounter: Payer: Self-pay | Admitting: Obstetrics and Gynecology

## 2016-09-10 ENCOUNTER — Ambulatory Visit (INDEPENDENT_AMBULATORY_CARE_PROVIDER_SITE_OTHER): Payer: Commercial Managed Care - HMO | Admitting: Obstetrics and Gynecology

## 2016-09-10 VITALS — BP 129/79 | HR 83 | Ht 67.0 in | Wt 339.4 lb

## 2016-09-10 DIAGNOSIS — R1031 Right lower quadrant pain: Secondary | ICD-10-CM | POA: Diagnosis not present

## 2016-09-10 DIAGNOSIS — N83201 Unspecified ovarian cyst, right side: Secondary | ICD-10-CM

## 2016-09-10 DIAGNOSIS — Z90711 Acquired absence of uterus with remaining cervical stump: Secondary | ICD-10-CM | POA: Diagnosis not present

## 2016-09-10 NOTE — Progress Notes (Signed)
GYN ENCOUNTER NOTE  Subjective:       Kimberly Cowan is a 50 y.o. [phone] female is here for gynecologic evaluation of the following issues:  1. Right lower quadrant pain 2. Complex ovarian cyst 3. History of recent weight gain, unexplained  50 year old African-American female, status post [place] by Dr. Elisabeth Pigeon for abnormal uterine bleeding, presents in referral for evaluation of severe right lower quadrant pelvic pain. 09/01/2016 patient developed cramping pain in the right lower quadrant, characterized as 10 out of 10 in intensity, with progression to include nausea and vomiting. The pain was worse with movement. She went to the emergency room for evaluation and appendicitis was ruled out. A complex ovarian cyst was identified along with UTI for which she was given an antibiotic, Motrin, and Percocet.  Since that evaluation on 09/01/2016, the patient continues to have the right lower quadrant pain, now characterized as a 4 out of 10 intensity. She is not needing narcotic analgesics at this time. Nausea and vomiting has resolved.  Patient has concerns about PCO S. She has had excessive weight gain recently in spite of monitoring her diet and exercising. She is status post hysterectomy. Menstrual history was not notable for irregular cycles. She does not admit to excessive hair growth requiring shaving or other hair reduction techniques.  08/31/2016 ultrasound report FINDINGS: Uterus  Surgically absent.  Right ovary  Discrete ovarian tissue is difficult to visualize, but likely present adjacent of the cysts, with blood flow within the tentatively identified ovarian tissue. There are 2 complex cystic regions in the right adnexa measuring 3.6 x 3.1 x 3.1 and an adjacent 2.2 x 1.6 x 1.9 cm. There are thin internal septations within both of the cysts.  Left ovary  Not visualized.  No adnexal mass.  Other findings  No abnormal free fluid.  IMPRESSION: Two  adjacent mildly complex right ovarian cysts, larger measuring 3.6 cm. There are no suspicious imaging characteristics, however given right-sided pain, could consider follow-up pelvic ultrasound in 6-12 weeks.  08/31/2016 CT scan report: IMPRESSION: 1. Normal-appearing appendix. No nephrolithiasis nor hydroureteronephrosis. 2. Two simple appearing right ovarian follicles/cysts are noted possibly contributing to the patient's pain, the largest is 3.3 cm. 3. Neurostimulator lead seen at the T7-T8 level.  PLIF at L5/S1. 4. Hysterectomy. 5. Gastric bypass.     Gynecologic History No LMP recorded. Patient has had a hysterectomy.  Obstetric History OB History  Gravida Para Term Preterm AB Living  2 2 2     2   SAB TAB Ectopic Multiple Live Births          2    # Outcome Date GA Lbr Len/2nd Weight Sex Delivery Anes PTL Lv  2 Term 1995   9 lb 8 oz (4.309 kg) M CS-LTranv   LIV  1 Term 1989   8 lb 2.4 oz (3.697 kg) F CS-LTranv   LIV      Past Medical History:  Diagnosis Date  . Anxiety   . Arthritis    lower spine  . Chronic back pain   . Clinical depression 04/04/2012  . Depression   . Headache(784.0)    migraines  . Ovarian cyst   . Pneumonia    walking pneumonia  . Sleep apnea    no cpcap     last sleep study > 4 yrs    Past Surgical History:  Procedure Laterality Date  . ABDOMINAL HYSTERECTOMY  10/2006   cervix and ovaries remain- [facility]  .  BACK SURGERY  2014   lumbar fusion  . BACK SURGERY  2009   discectomy  . BREAST REDUCTION SURGERY    . c sections    . GASTRIC BYPASS  2005  . REDUCTION MAMMAPLASTY Bilateral    2001  . SPINAL CORD STIMULATOR INSERTION N/A 03/20/2016   Procedure: LUMBAR SPINAL CORD STIMULATOR INSERTION;  Surgeon: Eustace Moore, MD;  Location: [place] [facility];  Service: Neurosurgery;  Laterality: N/A;  . TONSILLECTOMY      Current Outpatient Prescriptions on File Prior to Visit  Medication Sig Dispense Refill  . Cetirizine HCl 10 MG CAPS Take 1  capsule (10 mg total) by mouth daily. 30 capsule 3  . cyclobenzaprine (FLEXERIL) 10 MG tablet Take 1 tablet (10 mg total) by mouth 3 (three) times daily as needed. 15 tablet 0  . DULoxetine (CYMBALTA) 30 MG capsule Take 30 mg by mouth 2 (two) times daily.   3  . fluticasone (FLONASE) 50 MCG/ACT nasal spray Place 2 sprays into both nostrils daily. 16 g 1  . gabapentin (NEURONTIN) 300 MG capsule Take 600 mg by mouth at bedtime.   0  . ibuprofen (ADVIL,MOTRIN) 800 MG tablet Take 1 tablet (800 mg total) by mouth every 8 (eight) hours as needed for moderate pain. 15 tablet 0  . latanoprost (XALATAN) 0.005 % ophthalmic solution 1 drop at bedtime.    Marland Kitchen oxyCODONE-acetaminophen (ROXICET) 5-325 MG tablet Take 1 tablet by mouth every 4 (four) hours as needed for severe pain. 15 tablet 0  . rizatriptan (MAXALT-MLT) 10 MG disintegrating tablet TAKE 1 TABLET BY MOUTH AS NEEDED FOR MIGRAINE. MAY REPEAT IN 2 HOURS IF NEEDED. 10 tablet 2   No current facility-administered medications on file prior to visit.     Allergies  Allergen Reactions  . Morphine And Related Hives, Shortness Of Breath, Nausea And Vomiting and Other (See Comments)    "can't breathe"  . Penicillins Anaphylaxis, Hives, Shortness Of Breath and Nausea And Vomiting    "can't breathe", Has patient had a PCN reaction causing immediate rash, facial/tongue/throat swelling, SOB or lightheadedness with hypotension: Yes Has patient had a PCN reaction causing severe rash involving mucus membranes or skin necrosis: No Has patient had a PCN reaction that required hospitalization No Has patient had a PCN reaction occurring within the last 10 years: No If all of the above answers are "NO", then may proceed with Cephalosporin use.   . Latex Hives  . Baclofen Nausea Only    Jittery, anxious    Social History   Social History  . Marital status: Single    Spouse name: N/A  . Number of children: N/A  . Years of education: N/A   Occupational  History  . Not on file.   Social History Main Topics  . Smoking status: Never Smoker  . Smokeless tobacco: Never Used  . Alcohol use No  . Drug use: No  . Sexual activity: Not Currently    Birth control/ protection: Surgical   Other Topics Concern  . Not on file   Social History Narrative  . No narrative on file    Family History  Problem Relation Age of Onset  . Diabetes Mother   . Hypertension Mother   . Diabetes Sister   . Hypertension Father   . Hypertension Sister   . Breast cancer Neg Hx     The following portions of the patient's history were reviewed and updated as appropriate: allergies, current medications, past family history,  past medical history, past social history, past surgical history and problem list.  Review of Systems Review of Systems -History of present illness  Objective:   BP 129/79   Pulse 83   Ht 5\' 7"  (1.702 m)   Wt (!) 339 lb 6.4 oz (154 kg)   BMI 53.16 kg/m  CONSTITUTIONAL: Morbidly obese female in no acute distress.  HENT:  Normocephalic, atraumatic.  NECK: Normal range of motion, supple, no masses.  Normal thyroid.  SKIN: Skin is warm and dry. No rash noted. Not diaphoretic. No erythema. No pallor. [place]: Alert and oriented to person, place, and time. PSYCHIATRIC: Normal mood and affect. Normal behavior. Normal judgment and thought content. CARDIOVASCULAR:Not Examined RESPIRATORY: Not Examined BREASTS: Not Examined ABDOMEN: Soft, non distended; Non tender.  No Organomegaly. Large pannus PELVIC:  External Genitalia: Normal  BUS: Normal  Vagina: Normal estrogen effect; good vault support  Cervix: Normal; no lesions; no cervical motion tenderness  Uterus: Surgically absent  Adnexa: Normal; nonpalpable; right lower quadrant tender 1/4  RV: Normal   Bladder: Nontender MUSCULOSKELETAL: Normal range of motion. No tenderness.  No cyanosis, clubbing, or edema.     Assessment:   1. Right ovarian cyst, Multiple-2 - US Pelvis  Complete; Future - US Transvaginal Non-OB; Future  2. Right lower quadrant pain, nonacute - US Pelvis Complete; Future - US Transvaginal Non-OB; Future  3. Status post laparoscopic supracervical hysterectomy  4. Morbid obesity ([place])     Plan:   1. Pelvic ultrasound-6 weeks 2. Follow-up-7 weeks 3. Recommend ibuprofen 800 mg 3 times a day and Tylenol 2 tablets every 4 hours as needed for pain control 4. Return sooner if symptom exacerbation occurs regarding right lower quadrant pain  Brayton Mars, MD  Note: This dictation was prepared with Dragon dictation along with smaller phrase technology. Any transcriptional errors that result from this process are unintentional.

## 2016-09-11 NOTE — Patient Instructions (Signed)
1. Pelvic ultrasound is ordered 2. Continue with ibuprofen 800 mg 3 times a day and Tylenol for pain 3. Return in 6 weeks for follow-up ultrasound 4. Return in 7 weeks for follow-up or sooner if exacerbation of pelvic pain occurs.

## 2016-09-13 DIAGNOSIS — M4800 Spinal stenosis, site unspecified: Secondary | ICD-10-CM | POA: Diagnosis not present

## 2016-09-13 DIAGNOSIS — G4733 Obstructive sleep apnea (adult) (pediatric): Secondary | ICD-10-CM | POA: Diagnosis not present

## 2016-09-17 ENCOUNTER — Telehealth: Payer: Self-pay | Admitting: Obstetrics and Gynecology

## 2016-09-17 MED ORDER — IBUPROFEN 800 MG PO TABS: 800.0000 mg | ORAL_TABLET | Freq: Three times a day (TID) | ORAL | 0 refills | Status: DC | PRN

## 2016-09-17 NOTE — Telephone Encounter (Signed)
Medication for pain. Er gave her m motrin and percocet. Dr Tennis Must told her to continue with but she doesn't have an rx or refill ([place] [facility])

## 2016-09-17 NOTE — Telephone Encounter (Signed)
Pt aware per mad she may take ibup 800 q8 and tylenol 2 q4. Ibup erx.

## 2016-09-24 ENCOUNTER — Telehealth: Payer: Self-pay | Admitting: Obstetrics and Gynecology

## 2016-09-24 MED ORDER — OXYCODONE-ACETAMINOPHEN 5-325 MG PO TABS: 1.0000 | ORAL_TABLET | ORAL | 0 refills | Status: DC | PRN

## 2016-09-24 NOTE — Telephone Encounter (Signed)
Pt has a ovarian cyst 3.6cm causing pelvic pain. Taking ibup 800 q8 and tylenol regular strength q 8 h. Pt states her pain is still an 8. Per [facility] ok to give percocet #15 until Mad is back in the office. Pt advised to take percocet and ibup. NO tylenol while taking percocet. Rx left up front for p/u.

## 2016-09-24 NOTE — Telephone Encounter (Signed)
Patient called stating she is in pain again and she does not come back until 10/22/16 for an ultrasound to follow up on ovarian cyst. Please Advise

## 2016-09-28 DIAGNOSIS — H409 Unspecified glaucoma: Secondary | ICD-10-CM | POA: Insufficient documentation

## 2016-10-01 ENCOUNTER — Other Ambulatory Visit (INDEPENDENT_AMBULATORY_CARE_PROVIDER_SITE_OTHER): Payer: Commercial Managed Care - HMO

## 2016-10-01 ENCOUNTER — Encounter: Payer: Self-pay | Admitting: Obstetrics and Gynecology

## 2016-10-01 ENCOUNTER — Ambulatory Visit (INDEPENDENT_AMBULATORY_CARE_PROVIDER_SITE_OTHER): Payer: Commercial Managed Care - HMO | Admitting: Obstetrics and Gynecology

## 2016-10-01 VITALS — BP 145/79 | HR 76 | Ht 67.0 in | Wt 343.5 lb

## 2016-10-01 DIAGNOSIS — Z90711 Acquired absence of uterus with remaining cervical stump: Secondary | ICD-10-CM | POA: Diagnosis not present

## 2016-10-01 DIAGNOSIS — Z01818 Encounter for other preprocedural examination: Secondary | ICD-10-CM

## 2016-10-01 DIAGNOSIS — R1031 Right lower quadrant pain: Secondary | ICD-10-CM

## 2016-10-01 DIAGNOSIS — N83201 Unspecified ovarian cyst, right side: Secondary | ICD-10-CM | POA: Diagnosis not present

## 2016-10-01 NOTE — Progress Notes (Signed)
Subjective:  PREOPERATIVE HISTORY AND PHYSICAL    Date of surgery: 10/05/2016 Procedure: Laparoscopic BSO Diagnoses: 1. Chronic pelvic pain, worsening 2. History of complex right ovarian cyst    Patient is a 51 y.o. G2P2034female scheduled for laps, BSO on 10/05/2016. She has history of worsening pelvic pain, right lower quadrant with evidence of previous multicystic right ovary versus complex cyst; most recent ultrasound did not demonstrate any cystic mass. However, patient has been having persistent pelvic pain, with exacerbations that are affecting activities of daily living. Pain is considered constant, sharp with occasional exacerbation with cramps in the right lower quadrant.; On most recent ultrasound she also had left-sided tenderness. Procedure. Ibuprofen and Tylenol did not alleviate the pain. Only heat helps. Bowel function is consistent with irritable bowel with alternating diarrhea and constipation. She denies fevers chills or sweats. She denies loss of appetite. She has had recent weight gain.  Significant comorbidities include history of gastric bypass surgery, history of cesarean section 2 through midline incisions, and laparoscopic supracervical hysterectomy; she does have morbid obesity.   08/31/2016 ultrasound report FINDINGS: Uterus  Surgically absent.  Right ovary  Discrete ovarian tissue is difficult to visualize, but likely present adjacent of the cysts, with blood flow within the tentatively identified ovarian tissue. There are 2 complex cystic regions in the right adnexa measuring 3.6 x 3.1 x 3.1 and an adjacent 2.2 x 1.6 x 1.9 cm. There are thin internal septations within both of the cysts.  Left ovary  Not visualized. No adnexal mass.  Other findings  No abnormal free fluid.  IMPRESSION: Two adjacent mildly complex right ovarian cysts, larger measuring 3.6 cm. There are no suspicious imaging characteristics, however given right-sided  pain, could consider follow-up pelvic ultrasound in 6-12 weeks.  08/31/2016 CT scan report: IMPRESSION: 1. Normal-appearing appendix. No nephrolithiasis nor hydroureteronephrosis. 2. Two simple appearing right ovarian follicles/cysts are noted possibly contributing to the patient's pain, the largest is 3.3 cm. 3. Neurostimulator lead seen at the T7-T8 level. PLIF at L5/S1. 4. Hysterectomy. 5. Gastric bypass.     Gynecologic History No LMP recorded. Patient has had a hysterectomy.  OB History    Gravida Para Term Preterm AB Living   2 2 2     2    SAB TAB Ectopic Multiple Live Births           2      No LMP recorded. Patient has had a hysterectomy.    Past Medical History:  Diagnosis Date  . Anxiety   . Arthritis    lower spine  . Chronic back pain   . Clinical depression 04/04/2012  . Depression   . Headache(784.0)    migraines  . Ovarian cyst   . Pneumonia    walking pneumonia  . Sleep apnea    no cpcap     last sleep study > 4 yrs    Past Surgical History:  Procedure Laterality Date  . ABDOMINAL HYSTERECTOMY  10/2006   cervix and ovaries remain- [facility]  . BACK SURGERY  2014   lumbar fusion  . BACK SURGERY  2009   discectomy  . BREAST REDUCTION SURGERY    . c sections    . GASTRIC BYPASS  2005  . REDUCTION MAMMAPLASTY Bilateral    2001  . SPINAL CORD STIMULATOR INSERTION N/A 03/20/2016   Procedure: LUMBAR SPINAL CORD STIMULATOR INSERTION;  Surgeon: Eustace Moore, MD;  Location: [facility];  Service: Neurosurgery;  Laterality: N/A;  . TONSILLECTOMY  OB History  Gravida Para Term Preterm AB Living  2 2 2     2   SAB TAB Ectopic Multiple Live Births          2    # Outcome Date GA Lbr Len/2nd Weight Sex Delivery Anes PTL Lv  2 Term 1995   9 lb 8 oz (4.309 kg) M CS-LTranv   LIV  1 Term 1989   8 lb 2.4 oz (3.697 kg) F CS-LTranv   LIV      Social History   Social History  . Marital status: Single    Spouse name: N/A  . Number of children:  N/A  . Years of education: N/A   Social History Main Topics  . Smoking status: Never Smoker  . Smokeless tobacco: Never Used  . Alcohol use No  . Drug use: No  . Sexual activity: Not Currently    Birth control/ protection: Surgical   Other Topics Concern  . None   Social History Narrative  . None    Family History  Problem Relation Age of Onset  . Diabetes Mother   . Hypertension Mother   . Diabetes Sister   . Hypertension Father   . Hypertension Sister   . Breast cancer Neg Hx      (Not in a hospital admission)  Allergies  Allergen Reactions  . Morphine And Related Hives, Shortness Of Breath, Nausea And Vomiting and Other (See Comments)    "can't breathe"  . Penicillins Anaphylaxis, Hives, Shortness Of Breath and Nausea And Vomiting    "can't breathe", Has patient had a PCN reaction causing immediate rash, facial/tongue/throat swelling, SOB or lightheadedness with hypotension: Yes Has patient had a PCN reaction causing severe rash involving mucus membranes or skin necrosis: No Has patient had a PCN reaction that required hospitalization No Has patient had a PCN reaction occurring within the last 10 years: No If all of the above answers are "NO", then may proceed with Cephalosporin use.   . Latex Hives  . Baclofen Nausea Only    Jittery, anxious    Review of Systems Constitutional: No recent fever/chills/sweats Respiratory: No recent cough/bronchitis Cardiovascular: No chest pain Gastrointestinal: No recent nausea/vomiting/diarrhea Genitourinary: No UTI symptoms Hematologic/lymphatic:No history of coagulopathy or recent blood thinner use    Objective:    BP (!) 145/79   Pulse 76   Ht 5\' 7"  (1.702 m)   Wt (!) 343 lb 8 oz (155.8 kg)   BMI 53.80 kg/m   General:   Normal  Skin:   normal  HEENT:  Normal  Neck:  Supple without Adenopathy or Thyromegaly  Lungs:   Heart:              Breasts:   Abdomen:  Pelvis:  M/S   Extremeties:  Neuro:    clear  to auscultation bilaterally   Normal without murmur   Not Examined   soft, non-tender; bowel sounds normal; no masses,  no organomegaly   Exam deferred to OR  No CVAT  Warm/Dry   Normal       10/01/2016 Pelvic exam: External genitalia normal BUS-normal Vagina-normal Cervix-no cervical motion tenderness Uterus-30 with absent Adnexa-nonpalpable; bilateral tenderness 2/4 Rectovaginal-normal external exam  Assessment:    1. Complex right ovarian cysts 2. Status post [place] 3. History of cesarean section 2 4. History of gastric bypass 5. Morbid obesity   Plan:   Laparoscopic BSO  Patient was counseled regarding the planned procedure. She is aware of and  is accepting of all surgical risks which include but are not limited to bleeding, infection, pelvic organ injury with need for repair, blood clot disorders, anesthesia risks, etc. She does understand that removal of both ovaries will make her surgically menopausal and she may require estrogen replacement therapy for management of vasomotor symptoms and prevention of osteoporosis. The patient does understand that she has multiple comorbidities which can make the surgery complicated/prolonged. All questions have been answered. Patient is ready and willing to proceed with surgery as scheduled.  Brayton Mars, MD  Note: This dictation was prepared with Dragon dictation along with smaller phrase technology. Any transcriptional errors that result from this process are unintentional.

## 2016-10-01 NOTE — H&P (Signed)
Subjective:  PREOPERATIVE HISTORY AND PHYSICAL    Date of surgery: 10/05/2016 Procedure: Laparoscopic BSO Diagnoses: 1. Chronic pelvic pain, worsening 2. History of complex right ovarian cyst    Patient is a 51 y.o. G2P2031female scheduled for laps, BSO on 10/05/2016. She has history of worsening pelvic pain, right lower quadrant with evidence of previous multicystic right ovary versus complex cyst; most recent ultrasound did not demonstrate any cystic mass. However, patient has been having persistent pelvic pain, with exacerbations that are affecting activities of daily living. Pain is considered constant, sharp with occasional exacerbation with cramps in the right lower quadrant.; On most recent ultrasound she also had left-sided tenderness. Procedure. Ibuprofen and Tylenol did not alleviate the pain. Only heat helps. Bowel function is consistent with irritable bowel with alternating diarrhea and constipation. She denies fevers chills or sweats. She denies loss of appetite. She has had recent weight gain.  Significant comorbidities include history of gastric bypass surgery, history of cesarean section 2 through midline incisions, and laparoscopic supracervical hysterectomy; she does have morbid obesity.   08/31/2016 ultrasound report FINDINGS: Uterus  Surgically absent.  Right ovary  Discrete ovarian tissue is difficult to visualize, but likely present adjacent of the cysts, with blood flow within the tentatively identified ovarian tissue. There are 2 complex cystic regions in the right adnexa measuring 3.6 x 3.1 x 3.1 and an adjacent 2.2 x 1.6 x 1.9 cm. There are thin internal septations within both of the cysts.  Left ovary  Not visualized. No adnexal mass.  Other findings  No abnormal free fluid.  IMPRESSION: Two adjacent mildly complex right ovarian cysts, larger measuring 3.6 cm. There are no suspicious imaging characteristics, however given right-sided  pain, could consider follow-up pelvic ultrasound in 6-12 weeks.  08/31/2016 CT scan report: IMPRESSION: 1. Normal-appearing appendix. No nephrolithiasis nor hydroureteronephrosis. 2. Two simple appearing right ovarian follicles/cysts are noted possibly contributing to the patient's pain, the largest is 3.3 cm. 3. Neurostimulator lead seen at the T7-T8 level. PLIF at L5/S1. 4. Hysterectomy. 5. Gastric bypass.     Gynecologic History No LMP recorded. Patient has had a hysterectomy.  OB History    Gravida Para Term Preterm AB Living   2 2 2     2    SAB TAB Ectopic Multiple Live Births           2      No LMP recorded. Patient has had a hysterectomy.    Past Medical History:  Diagnosis Date  . Anxiety   . Arthritis    lower spine  . Chronic back pain   . Clinical depression 04/04/2012  . Depression   . Headache(784.0)    migraines  . Ovarian cyst   . Pneumonia    walking pneumonia  . Sleep apnea    no cpcap     last sleep study > 4 yrs    Past Surgical History:  Procedure Laterality Date  . ABDOMINAL HYSTERECTOMY  10/2006   cervix and ovaries remain- [facility]  . BACK SURGERY  2014   lumbar fusion  . BACK SURGERY  2009   discectomy  . BREAST REDUCTION SURGERY    . c sections    . GASTRIC BYPASS  2005  . REDUCTION MAMMAPLASTY Bilateral    2001  . SPINAL CORD STIMULATOR INSERTION N/A 03/20/2016   Procedure: LUMBAR SPINAL CORD STIMULATOR INSERTION;  Surgeon: Eustace Moore, MD;  Location: [facility];  Service: Neurosurgery;  Laterality: N/A;  . TONSILLECTOMY  OB History  Gravida Para Term Preterm AB Living  2 2 2     2   SAB TAB Ectopic Multiple Live Births          2    # Outcome Date GA Lbr Len/2nd Weight Sex Delivery Anes PTL Lv  2 Term 1995   9 lb 8 oz (4.309 kg) M CS-LTranv   LIV  1 Term 1989   8 lb 2.4 oz (3.697 kg) F CS-LTranv   LIV      Social History   Social History  . Marital status: Single    Spouse name: N/A  . Number of children:  N/A  . Years of education: N/A   Social History Main Topics  . Smoking status: Never Smoker  . Smokeless tobacco: Never Used  . Alcohol use No  . Drug use: No  . Sexual activity: Not Currently    Birth control/ protection: Surgical   Other Topics Concern  . None   Social History Narrative  . None    Family History  Problem Relation Age of Onset  . Diabetes Mother   . Hypertension Mother   . Diabetes Sister   . Hypertension Father   . Hypertension Sister   . Breast cancer Neg Hx      (Not in a hospital admission)  Allergies  Allergen Reactions  . Morphine And Related Hives, Shortness Of Breath, Nausea And Vomiting and Other (See Comments)    "can't breathe"  . Penicillins Anaphylaxis, Hives, Shortness Of Breath and Nausea And Vomiting    "can't breathe", Has patient had a PCN reaction causing immediate rash, facial/tongue/throat swelling, SOB or lightheadedness with hypotension: Yes Has patient had a PCN reaction causing severe rash involving mucus membranes or skin necrosis: No Has patient had a PCN reaction that required hospitalization No Has patient had a PCN reaction occurring within the last 10 years: No If all of the above answers are "NO", then may proceed with Cephalosporin use.   . Latex Hives  . Baclofen Nausea Only    Jittery, anxious    Review of Systems Constitutional: No recent fever/chills/sweats Respiratory: No recent cough/bronchitis Cardiovascular: No chest pain Gastrointestinal: No recent nausea/vomiting/diarrhea Genitourinary: No UTI symptoms Hematologic/lymphatic:No history of coagulopathy or recent blood thinner use    Objective:    BP (!) 145/79   Pulse 76   Ht 5\' 7"  (1.702 m)   Wt (!) 343 lb 8 oz (155.8 kg)   BMI 53.80 kg/m   General:   Normal  Skin:   normal  HEENT:  Normal  Neck:  Supple without Adenopathy or Thyromegaly  Lungs:   Heart:              Breasts:   Abdomen:  Pelvis:  M/S   Extremeties:  Neuro:    clear  to auscultation bilaterally   Normal without murmur   Not Examined   soft, non-tender; bowel sounds normal; no masses,  no organomegaly   Exam deferred to OR  No CVAT  Warm/Dry   Normal       10/01/2016 Pelvic exam: External genitalia normal BUS-normal Vagina-normal Cervix-no cervical motion tenderness Uterus-30 with absent Adnexa-nonpalpable; bilateral tenderness 2/4 Rectovaginal-normal external exam  Assessment:    1. Complex right ovarian cysts 2. Status post [place] 3. History of cesarean section 2 4. History of gastric bypass 5. Morbid obesity   Plan:   Laparoscopic BSO  Patient was counseled regarding the planned procedure. She is aware of and  is accepting of all surgical risks which include but are not limited to bleeding, infection, pelvic organ injury with need for repair, blood clot disorders, anesthesia risks, etc. She does understand that removal of both ovaries will make her surgically menopausal and she may require estrogen replacement therapy for management of vasomotor symptoms and prevention of osteoporosis. The patient does understand that she has multiple comorbidities which can make the surgery complicated/prolonged. All questions have been answered. Patient is ready and willing to proceed with surgery as scheduled.  Brayton Mars, MD  Note: This dictation was prepared with Dragon dictation along with smaller phrase technology. Any transcriptional errors that result from this process are unintentional.

## 2016-10-01 NOTE — Patient Instructions (Signed)
1. Return week after surgery for postop check

## 2016-10-02 ENCOUNTER — Ambulatory Visit (INDEPENDENT_AMBULATORY_CARE_PROVIDER_SITE_OTHER): Payer: Commercial Managed Care - HMO

## 2016-10-02 VITALS — Resp 16 | Ht 67.0 in | Wt 343.0 lb

## 2016-10-02 DIAGNOSIS — E538 Deficiency of other specified B group vitamins: Secondary | ICD-10-CM

## 2016-10-02 DIAGNOSIS — H40153 Residual stage of open-angle glaucoma, bilateral: Secondary | ICD-10-CM | POA: Diagnosis not present

## 2016-10-02 MED ORDER — CYANOCOBALAMIN 1000 MCG/ML IJ SOLN
1000.0000 ug | Freq: Once | INTRAMUSCULAR | Status: AC
  Administered 2016-10-02: 1000 ug via INTRAMUSCULAR

## 2016-10-05 ENCOUNTER — Ambulatory Visit: Payer: Commercial Managed Care - HMO

## 2016-10-05 ENCOUNTER — Ambulatory Visit: Payer: Medicare HMO | Admitting: Registered Nurse

## 2016-10-05 ENCOUNTER — Observation Stay
Admission: RE | Admit: 2016-10-05 | Discharge: 2016-10-06 | Disposition: A | Discharge status: Home / Self Care | Payer: Medicare HMO | Source: Ambulatory Visit | Attending: Obstetrics and Gynecology | Admitting: Obstetrics and Gynecology

## 2016-10-05 ENCOUNTER — Encounter: Payer: Self-pay | Admitting: *Deleted

## 2016-10-05 ENCOUNTER — Encounter
Admission: RE | Disposition: A | Discharge status: Home / Self Care | Payer: Self-pay | Source: Ambulatory Visit | Attending: Obstetrics and Gynecology

## 2016-10-05 DIAGNOSIS — Z833 Family history of diabetes mellitus: Secondary | ICD-10-CM | POA: Diagnosis not present

## 2016-10-05 DIAGNOSIS — Z90711 Acquired absence of uterus with remaining cervical stump: Secondary | ICD-10-CM

## 2016-10-05 DIAGNOSIS — G8929 Other chronic pain: Secondary | ICD-10-CM | POA: Insufficient documentation

## 2016-10-05 DIAGNOSIS — Z8249 Family history of ischemic heart disease and other diseases of the circulatory system: Secondary | ICD-10-CM | POA: Diagnosis not present

## 2016-10-05 DIAGNOSIS — D270 Benign neoplasm of right ovary: Principal | ICD-10-CM | POA: Insufficient documentation

## 2016-10-05 DIAGNOSIS — N8301 Follicular cyst of right ovary: Secondary | ICD-10-CM | POA: Diagnosis not present

## 2016-10-05 DIAGNOSIS — R102 Pelvic and perineal pain: Secondary | ICD-10-CM | POA: Insufficient documentation

## 2016-10-05 DIAGNOSIS — Z981 Arthrodesis status: Secondary | ICD-10-CM | POA: Insufficient documentation

## 2016-10-05 DIAGNOSIS — G43909 Migraine, unspecified, not intractable, without status migrainosus: Secondary | ICD-10-CM | POA: Insufficient documentation

## 2016-10-05 DIAGNOSIS — N8302 Follicular cyst of left ovary: Secondary | ICD-10-CM | POA: Diagnosis not present

## 2016-10-05 DIAGNOSIS — Z90722 Acquired absence of ovaries, bilateral: Secondary | ICD-10-CM

## 2016-10-05 DIAGNOSIS — F329 Major depressive disorder, single episode, unspecified: Secondary | ICD-10-CM | POA: Insufficient documentation

## 2016-10-05 DIAGNOSIS — M1990 Unspecified osteoarthritis, unspecified site: Secondary | ICD-10-CM | POA: Diagnosis not present

## 2016-10-05 DIAGNOSIS — Z9884 Bariatric surgery status: Secondary | ICD-10-CM | POA: Diagnosis not present

## 2016-10-05 DIAGNOSIS — Z6841 Body Mass Index (BMI) 40.0 and over, adult: Secondary | ICD-10-CM | POA: Diagnosis not present

## 2016-10-05 DIAGNOSIS — F419 Anxiety disorder, unspecified: Secondary | ICD-10-CM | POA: Insufficient documentation

## 2016-10-05 DIAGNOSIS — Z885 Allergy status to narcotic agent status: Secondary | ICD-10-CM | POA: Diagnosis not present

## 2016-10-05 DIAGNOSIS — Z880 Allergy status to penicillin: Secondary | ICD-10-CM | POA: Diagnosis not present

## 2016-10-05 DIAGNOSIS — M549 Dorsalgia, unspecified: Secondary | ICD-10-CM | POA: Diagnosis not present

## 2016-10-05 DIAGNOSIS — Z888 Allergy status to other drugs, medicaments and biological substances status: Secondary | ICD-10-CM | POA: Diagnosis not present

## 2016-10-05 DIAGNOSIS — J441 Chronic obstructive pulmonary disease with (acute) exacerbation: Secondary | ICD-10-CM | POA: Diagnosis not present

## 2016-10-05 DIAGNOSIS — R1031 Right lower quadrant pain: Secondary | ICD-10-CM | POA: Diagnosis not present

## 2016-10-05 DIAGNOSIS — Z90710 Acquired absence of both cervix and uterus: Secondary | ICD-10-CM | POA: Diagnosis not present

## 2016-10-05 DIAGNOSIS — N7011 Chronic salpingitis: Secondary | ICD-10-CM | POA: Diagnosis not present

## 2016-10-05 DIAGNOSIS — N736 Female pelvic peritoneal adhesions (postinfective): Secondary | ICD-10-CM | POA: Diagnosis not present

## 2016-10-05 DIAGNOSIS — G4730 Sleep apnea, unspecified: Secondary | ICD-10-CM | POA: Diagnosis not present

## 2016-10-05 DIAGNOSIS — N83201 Unspecified ovarian cyst, right side: Secondary | ICD-10-CM

## 2016-10-05 DIAGNOSIS — Z91040 Latex allergy status: Secondary | ICD-10-CM | POA: Diagnosis not present

## 2016-10-05 HISTORY — PX: LAPAROSCOPIC SALPINGO OOPHERECTOMY: SHX5927

## 2016-10-05 LAB — CBC WITH DIFFERENTIAL/PLATELET
Basophils Absolute: 0 10*3/uL (ref 0–0.1)
Basophils Relative: 0 %
Eosinophils Absolute: 0.1 10*3/uL (ref 0–0.7)
Eosinophils Relative: 3 %
HEMATOCRIT: 34.8 % — AB (ref 35.0–47.0)
Hemoglobin: 11.6 g/dL — ABNORMAL LOW (ref 12.0–16.0)
LYMPHS ABS: 1.6 10*3/uL (ref 1.0–3.6)
LYMPHS PCT: 42 %
MCH: 27 pg (ref 26.0–34.0)
MCHC: 33.3 g/dL (ref 32.0–36.0)
MCV: 81 fL (ref 80.0–100.0)
MONOS PCT: 8 %
Monocytes Absolute: 0.3 10*3/uL (ref 0.2–0.9)
NEUTROS ABS: 1.8 10*3/uL (ref 1.4–6.5)
Neutrophils Relative %: 47 %
Platelets: 254 10*3/uL (ref 150–440)
RBC: 4.3 MIL/uL (ref 3.80–5.20)
RDW: 16.3 % — AB (ref 11.5–14.5)
WBC: 3.9 10*3/uL (ref 3.6–11.0)

## 2016-10-05 LAB — TYPE AND SCREEN
ABO/RH(D): B POS
Antibody Screen: NEGATIVE

## 2016-10-05 LAB — RAPID HIV SCREEN (HIV 1/2 AB+AG)
HIV 1/2 Antibodies: NONREACTIVE
HIV-1 P24 Antigen - HIV24: NONREACTIVE

## 2016-10-05 SURGERY — SALPINGO-OOPHORECTOMY, LAPAROSCOPIC
Anesthesia: General | Laterality: Bilateral | Wound class: Clean Contaminated

## 2016-10-05 MED ORDER — GABAPENTIN 300 MG PO CAPS
600.0000 mg | ORAL_CAPSULE | Freq: Every day | ORAL | Status: DC
  Administered 2016-10-05: 600 mg via ORAL
  Filled 2016-10-05: qty 2

## 2016-10-05 MED ORDER — MIDAZOLAM HCL 2 MG/2ML IJ SOLN
INTRAMUSCULAR | Status: AC
  Filled 2016-10-05: qty 2

## 2016-10-05 MED ORDER — LIDOCAINE HCL (CARDIAC) 20 MG/ML IV SOLN
INTRAVENOUS | Status: DC | PRN
  Administered 2016-10-05: 100 mg via INTRAVENOUS

## 2016-10-05 MED ORDER — HYDROMORPHONE HCL 1 MG/ML IJ SOLN
INTRAMUSCULAR | Status: AC
  Administered 2016-10-05: 0.5 mg via INTRAVENOUS
  Filled 2016-10-05: qty 1

## 2016-10-05 MED ORDER — SUCCINYLCHOLINE CHLORIDE 20 MG/ML IJ SOLN
INTRAMUSCULAR | Status: DC | PRN
  Administered 2016-10-05: 160 mg via INTRAVENOUS

## 2016-10-05 MED ORDER — DEXAMETHASONE SODIUM PHOSPHATE 10 MG/ML IJ SOLN
INTRAMUSCULAR | Status: AC
  Filled 2016-10-05: qty 1

## 2016-10-05 MED ORDER — FENTANYL CITRATE (PF) 250 MCG/5ML IJ SOLN
INTRAMUSCULAR | Status: AC
  Filled 2016-10-05: qty 5

## 2016-10-05 MED ORDER — ROCURONIUM BROMIDE 100 MG/10ML IV SOLN
INTRAVENOUS | Status: DC | PRN
  Administered 2016-10-05: 20 mg via INTRAVENOUS
  Administered 2016-10-05: 10 mg via INTRAVENOUS
  Administered 2016-10-05: 40 mg via INTRAVENOUS

## 2016-10-05 MED ORDER — PROPOFOL 10 MG/ML IV BOLUS
INTRAVENOUS | Status: DC | PRN
  Administered 2016-10-05: 200 mg via INTRAVENOUS

## 2016-10-05 MED ORDER — ACETAMINOPHEN 325 MG PO TABS: 650.0000 mg | ORAL_TABLET | ORAL | Status: DC | PRN

## 2016-10-05 MED ORDER — LIDOCAINE 2% (20 MG/ML) 5 ML SYRINGE
INTRAMUSCULAR | Status: AC
  Filled 2016-10-05: qty 5

## 2016-10-05 MED ORDER — SIMETHICONE 80 MG PO CHEW: 80.0000 mg | CHEWABLE_TABLET | Freq: Four times a day (QID) | ORAL | Status: DC | PRN

## 2016-10-05 MED ORDER — BISACODYL 10 MG RE SUPP: 10.0000 mg | Freq: Every day | RECTAL | Status: DC | PRN

## 2016-10-05 MED ORDER — DOCUSATE SODIUM 100 MG PO CAPS
100.0000 mg | ORAL_CAPSULE | Freq: Two times a day (BID) | ORAL | Status: DC
  Administered 2016-10-05 – 2016-10-06 (×2): 100 mg via ORAL
  Filled 2016-10-05 (×2): qty 1

## 2016-10-05 MED ORDER — ONDANSETRON HCL 4 MG/2ML IJ SOLN: 4.0000 mg | Freq: Once | INTRAMUSCULAR | Status: DC | PRN

## 2016-10-05 MED ORDER — DEXAMETHASONE SODIUM PHOSPHATE 10 MG/ML IJ SOLN
INTRAMUSCULAR | Status: DC | PRN
  Administered 2016-10-05: 10 mg via INTRAVENOUS

## 2016-10-05 MED ORDER — SUGAMMADEX SODIUM 500 MG/5ML IV SOLN
INTRAVENOUS | Status: DC | PRN
  Administered 2016-10-05: 320 mg via INTRAVENOUS

## 2016-10-05 MED ORDER — ONDANSETRON HCL 4 MG/2ML IJ SOLN
INTRAMUSCULAR | Status: AC
  Filled 2016-10-05: qty 2

## 2016-10-05 MED ORDER — FLUTICASONE PROPIONATE 50 MCG/ACT NA SUSP
2.0000 | Freq: Every day | NASAL | Status: DC
  Administered 2016-10-06: 2 via NASAL
  Filled 2016-10-05 (×2): qty 16

## 2016-10-05 MED ORDER — KETOROLAC TROMETHAMINE 30 MG/ML IJ SOLN
30.0000 mg | Freq: Four times a day (QID) | INTRAMUSCULAR | Status: DC
Start: 2016-10-05 — End: 2016-10-06
  Administered 2016-10-05 – 2016-10-06 (×4): 30 mg via INTRAVENOUS
  Filled 2016-10-05 (×4): qty 1

## 2016-10-05 MED ORDER — HYDROMORPHONE HCL 1 MG/ML IJ SOLN
0.5000 mg | INTRAMUSCULAR | Status: DC | PRN
  Administered 2016-10-05 (×3): 0.5 mg via INTRAVENOUS

## 2016-10-05 MED ORDER — ACETAMINOPHEN 10 MG/ML IV SOLN
INTRAVENOUS | Status: DC | PRN
  Administered 2016-10-05: 1000 mg via INTRAVENOUS

## 2016-10-05 MED ORDER — PROPOFOL 10 MG/ML IV BOLUS
INTRAVENOUS | Status: AC
  Filled 2016-10-05: qty 20

## 2016-10-05 MED ORDER — MORPHINE SULFATE (PF) 2 MG/ML IV SOLN: 1.0000 mg | INTRAVENOUS | Status: DC | PRN

## 2016-10-05 MED ORDER — ACETAMINOPHEN 10 MG/ML IV SOLN
INTRAVENOUS | Status: AC
  Filled 2016-10-05: qty 100

## 2016-10-05 MED ORDER — DULOXETINE HCL 30 MG PO CPEP
30.0000 mg | ORAL_CAPSULE | Freq: Two times a day (BID) | ORAL | Status: DC
  Administered 2016-10-05 – 2016-10-06 (×2): 30 mg via ORAL
  Filled 2016-10-05 (×3): qty 1

## 2016-10-05 MED ORDER — LATANOPROST 0.005 % OP SOLN
1.0000 [drp] | Freq: Every day | OPHTHALMIC | Status: DC
  Administered 2016-10-05: 1 [drp] via OPHTHALMIC
  Filled 2016-10-05: qty 2.5

## 2016-10-05 MED ORDER — MIDAZOLAM HCL 2 MG/2ML IJ SOLN
INTRAMUSCULAR | Status: DC | PRN
  Administered 2016-10-05: 2 mg via INTRAVENOUS

## 2016-10-05 MED ORDER — KETOROLAC TROMETHAMINE 30 MG/ML IJ SOLN
30.0000 mg | Freq: Four times a day (QID) | INTRAMUSCULAR | Status: DC
Start: 2016-10-05 — End: 2016-10-06

## 2016-10-05 MED ORDER — OXYCODONE-ACETAMINOPHEN 5-325 MG PO TABS
1.0000 | ORAL_TABLET | ORAL | Status: DC | PRN
  Administered 2016-10-06: 2 via ORAL
  Filled 2016-10-05: qty 2

## 2016-10-05 MED ORDER — LORATADINE 10 MG PO TABS
10.0000 mg | ORAL_TABLET | Freq: Every day | ORAL | Status: DC
  Administered 2016-10-06: 10 mg via ORAL
  Filled 2016-10-05: qty 1

## 2016-10-05 MED ORDER — FENTANYL CITRATE (PF) 100 MCG/2ML IJ SOLN
INTRAMUSCULAR | Status: AC
  Administered 2016-10-05: 25 ug via INTRAVENOUS
  Filled 2016-10-05: qty 2

## 2016-10-05 MED ORDER — LACTATED RINGERS IV SOLN
INTRAVENOUS | Status: DC
  Administered 2016-10-06: via INTRAVENOUS

## 2016-10-05 MED ORDER — LACTATED RINGERS IV SOLN
INTRAVENOUS | Status: DC
  Administered 2016-10-05 (×2): via INTRAVENOUS

## 2016-10-05 MED ORDER — FENTANYL CITRATE (PF) 100 MCG/2ML IJ SOLN
INTRAMUSCULAR | Status: DC | PRN
  Administered 2016-10-05: 50 ug via INTRAVENOUS
  Administered 2016-10-05: 100 ug via INTRAVENOUS
  Administered 2016-10-05: 50 ug via INTRAVENOUS

## 2016-10-05 MED ORDER — FENTANYL CITRATE (PF) 100 MCG/2ML IJ SOLN
25.0000 ug | INTRAMUSCULAR | Status: DC | PRN
  Administered 2016-10-05 (×4): 25 ug via INTRAVENOUS

## 2016-10-05 MED ORDER — ONDANSETRON HCL 4 MG/2ML IJ SOLN
INTRAMUSCULAR | Status: DC | PRN
  Administered 2016-10-05: 4 mg via INTRAVENOUS

## 2016-10-05 MED ORDER — SUCCINYLCHOLINE CHLORIDE 200 MG/10ML IV SOSY
PREFILLED_SYRINGE | INTRAVENOUS | Status: AC
  Filled 2016-10-05: qty 10

## 2016-10-05 MED ORDER — SUGAMMADEX SODIUM 500 MG/5ML IV SOLN
INTRAVENOUS | Status: AC
Start: 2016-10-05 — End: 2016-10-05
  Filled 2016-10-05: qty 5

## 2016-10-05 MED ORDER — HYDROMORPHONE HCL 1 MG/ML IJ SOLN
1.0000 mg | INTRAMUSCULAR | Status: DC | PRN
  Administered 2016-10-05 (×2): 1 mg via INTRAVENOUS
  Filled 2016-10-05 (×2): qty 1

## 2016-10-05 SURGICAL SUPPLY — 44 items
ADH SKN CLS APL DERMABOND .7 (GAUZE/BANDAGES/DRESSINGS) ×1
ANCHOR TIS RET SYS 235ML (MISCELLANEOUS) ×1 IMPLANT
BAG TISS RTRVL C235 10X14 (MISCELLANEOUS) ×1
BLADE SURG SZ11 CARB STEEL (BLADE) ×2 IMPLANT
CANISTER SUCT 1200ML W/VALVE (MISCELLANEOUS) ×2 IMPLANT
CATH ROBINSON RED A/P 16FR (CATHETERS) ×1 IMPLANT
CATH SILICONE 16FRX5CC (CATHETERS) ×1 IMPLANT
CHLORAPREP W/TINT 26ML (MISCELLANEOUS) ×3 IMPLANT
DERMABOND ADVANCED (GAUZE/BANDAGES/DRESSINGS) ×1
DERMABOND ADVANCED .7 DNX12 (GAUZE/BANDAGES/DRESSINGS) IMPLANT
DRSG TEGADERM 2-3/8X2-3/4 SM (GAUZE/BANDAGES/DRESSINGS) ×3 IMPLANT
GLOVE BIO SURGEON STRL SZ8 (GLOVE) ×2 IMPLANT
GLOVE INDICATOR 8.0 STRL GRN (GLOVE) ×1 IMPLANT
GOWN STRL REUS W/ TWL LRG LVL3 (GOWN DISPOSABLE) ×2 IMPLANT
GOWN STRL REUS W/ TWL XL LVL3 (GOWN DISPOSABLE) ×1 IMPLANT
GOWN STRL REUS W/TWL LRG LVL3 (GOWN DISPOSABLE) ×4
GOWN STRL REUS W/TWL XL LVL3 (GOWN DISPOSABLE) ×2
GRASPER SUT TROCAR 14GX15 (MISCELLANEOUS) ×1 IMPLANT
IRRIGATION STRYKERFLOW (MISCELLANEOUS) IMPLANT
IRRIGATOR STRYKERFLOW (MISCELLANEOUS)
IV LACTATED RINGERS 1000ML (IV SOLUTION) ×1 IMPLANT
KIT PINK PAD W/HEAD ARE REST (MISCELLANEOUS) ×2
KIT PINK PAD W/HEAD ARM REST (MISCELLANEOUS) ×1 IMPLANT
KIT RM TURNOVER CYSTO AR (KITS) ×2 IMPLANT
LABEL OR SOLS (LABEL) ×2 IMPLANT
LIQUID BAND (GAUZE/BANDAGES/DRESSINGS) IMPLANT
NS IRRIG 1000ML POUR BTL (IV SOLUTION) ×1 IMPLANT
NS IRRIG 500ML POUR BTL (IV SOLUTION) ×2 IMPLANT
PACK GYN LAPAROSCOPIC (MISCELLANEOUS) ×2 IMPLANT
PAD OB MATERNITY 4.3X12.25 (PERSONAL CARE ITEMS) ×2 IMPLANT
PAD PREP 24X41 OB/GYN DISP (PERSONAL CARE ITEMS) ×2 IMPLANT
POUCH ENDO CATCH 10MM SPEC (MISCELLANEOUS) IMPLANT
SCISSORS METZENBAUM CVD 33 (INSTRUMENTS) IMPLANT
SHEARS HARMONIC ACE PLUS 36CM (ENDOMECHANICALS) IMPLANT
SLEEVE ENDOPATH XCEL 5M (ENDOMECHANICALS) ×1 IMPLANT
SUT MNCRL 4-0 (SUTURE) ×2
SUT MNCRL 4-0 27XMFL (SUTURE) ×1
SUT VIC AB 0 CT2 27 (SUTURE) ×1 IMPLANT
SUT VIC AB 0 UR5 27 (SUTURE) ×2 IMPLANT
SUT VICRYL 0 AB UR-6 (SUTURE) ×1 IMPLANT
SUTURE MNCRL 4-0 27XMF (SUTURE) ×1 IMPLANT
TROCAR ENDO BLADELESS 11MM (ENDOMECHANICALS) ×1 IMPLANT
TROCAR XCEL NON-BLD 5MMX100MML (ENDOMECHANICALS) ×2 IMPLANT
TUBING INSUFFLATOR HI FLOW (MISCELLANEOUS) ×2 IMPLANT

## 2016-10-05 NOTE — Anesthesia Procedure Notes (Signed)
Procedure Name: Intubation Date/Time: 10/05/2016 1:36 PM Performed by: Hedda Slade Pre-anesthesia Checklist: Patient identified, Emergency Drugs available, Suction available and Patient being monitored Patient Re-evaluated:Patient Re-evaluated prior to inductionOxygen Delivery Method: Circle system utilized Preoxygenation: Pre-oxygenation with 100% oxygen Intubation Type: IV induction Ventilation: Mask ventilation without difficulty and Oral airway inserted - appropriate to patient size Laryngoscope Size: Mac and 3 Grade View: Grade I Tube type: Oral Tube size: 7.5 mm Number of attempts: 1 Airway Equipment and Method: Patient positioned with wedge pillow and Stylet Placement Confirmation: ETT inserted through vocal cords under direct vision,  positive ETCO2 and breath sounds checked- equal and bilateral Secured at: 22 cm Tube secured with: Tape Dental Injury: Teeth and Oropharynx as per pre-operative assessment

## 2016-10-05 NOTE — Interval H&P Note (Signed)
History and Physical Interval Note:  10/05/2016 12:58 PM  Kimberly Cowan  has presented today for surgery, with the diagnosis of n/a  The various methods of treatment have been discussed with the patient and family. After consideration of risks, benefits and other options for treatment, the patient has consented to  Procedure(s): LAPAROSCOPIC SALPINGO OOPHORECTOMY (Bilateral) as a surgical intervention .  The patient's history has been reviewed, patient examined, no change in status, stable for surgery.  I have reviewed the patient's chart and labs.  Questions were answered to the patient's satisfaction.     Hassell Done A Valera Vallas

## 2016-10-05 NOTE — Op Note (Signed)
OPERATIVE NOTE:  Kimberly Cowan PROCEDURE DATE: 10/05/2016   PREOPERATIVE DIAGNOSIS:  1. Chronic pelvic pain 2. Complex right ovarian cyst  POSTOPERATIVE DIAGNOSIS:  1. Chronic pelvic pain 2. Complex right ovarian cyst and hydrosalpinx  PROCEDURE:  Laparoscopic BSO with adhesiolysis  SURGEON:  Brayton Mars, MD ASSISTANTS: Dr. Marcelline Mates ANESTHESIA: General INDICATIONS: 51 y.o. [id number] with history of chronic pelvic pain with recent exacerbation compromising activities of daily living, presents for surgical management. Preoperative workup included ultrasound and CT scan which demonstrated complex right ovarian cyst. Patient is status post cesarean section 2, laparoscopic supracervical hysterectomy, gastric bypass surgery. She has morbid obesity.  FINDINGS:   Right hydrosalpinx and right ovarian cyst Small left hydrosalpinx and normal-appearing ovary   I/O's: Total I/O In: -  Out: 360 [Urine:350; Blood:10] COUNTS:  YES SPECIMENS: Right fallopian tube and ovary; left fallopian tube and ovary ANTIBIOTIC PROPHYLAXIS:N/A COMPLICATIONS: None immediate  PROCEDURE IN DETAIL: Patient was brought to the operating room and placed in the supine position. General endotracheal anesthesia was induced. The patient was carefully placed in the dorsal lithotomy position for laparoscopic surgery. The Allen stirrups were used for positioning. A ChloraPrep and Hibiclens abdominal perineal intravaginal prep and drape was performed in standard fashion. A timeout was completed. A Foley catheter was placed into the bladder and was draining clear yellow urine. Left upper quadrant entry through Palmer's point was made with a 5 mm trocar following a 5 mm stab wound. No evidence of bowel or vascular injury was encountered. Pneumoperitoneum was created. 2 other 5 mm ports were placed in the abdomen; subumbilical incision allowed a subumbilical port and a left lower quadrant incision allowed a  left lower quadrant port. An 11 mm port was placed in the right lower quadrant. The adhesions identified were taken down with the Ace Harmonic scalpel primarily in the cul-de-sac region and right adnexal region. Once the adnexa were freely mobile, the infundibular pelvic ligaments were then coagulated and cut using the Ace Harmonic scalpel. The specimens were removed sequentially through an Endo Catch bag system. Good hemostasis was noted. Following completion of the laparoscopic BSO and adhesiolysis and close inspection of the abdomen and pelvis, the procedure was terminated. The 11 mm port site was closed with the needle closure mechanism using 0 Vicryl sutures. 2 simple interrupted sutures were placed. This closed the 11 mm port defect. The 5 mm ports were closed using Dermabond glue. Upon completion of the procedure the patient was extubated and awakened and taken to recovery room in satisfactory condition.  Kimberly Cowan A. Zipporah Plants, MD, [facility]

## 2016-10-05 NOTE — Transfer of Care (Signed)
Immediate Anesthesia Transfer of Care Note  Patient: Kimberly Cowan  Procedure(s) Performed: Procedure(s): LAPAROSCOPIC SALPINGO OOPHORECTOMY (Bilateral)  Patient Location: PACU  Anesthesia Type:General  Level of Consciousness: awake, alert , oriented and patient cooperative  Airway & Oxygen Therapy: Patient Spontanous Breathing and Patient connected to face mask oxygen  Post-op Assessment: Report given to RN, Post -op Vital signs reviewed and stable and Patient moving all extremities X 4  Post vital signs: Reviewed and stable  Last Vitals:  Vitals:   10/05/16 1239  BP: (!) 150/86  Pulse: 79  Resp: 18  Temp: 36.4 C    Last Pain:  Vitals:   10/05/16 1239  TempSrc: Tympanic  PainSc: 5          Complications: No apparent anesthesia complications

## 2016-10-05 NOTE — H&P (View-Only) (Signed)
Subjective:  PREOPERATIVE HISTORY AND PHYSICAL    Date of surgery: 10/05/2016 Procedure: Laparoscopic BSO Diagnoses: 1. Chronic pelvic pain, worsening 2. History of complex right ovarian cyst    Patient is a 51 y.o. [id number] scheduled for laps, BSO on 10/05/2016. She has history of worsening pelvic pain, right lower quadrant with evidence of previous multicystic right ovary versus complex cyst; most recent ultrasound did not demonstrate any cystic mass. However, patient has been having persistent pelvic pain, with exacerbations that are affecting activities of daily living. Pain is considered constant, sharp with occasional exacerbation with cramps in the right lower quadrant.; On most recent ultrasound she also had left-sided tenderness. Procedure. Ibuprofen and Tylenol did not alleviate the pain. Only heat helps. Bowel function is consistent with irritable bowel with alternating diarrhea and constipation. She denies fevers chills or sweats. She denies loss of appetite. She has had recent weight gain.  Significant comorbidities include history of gastric bypass surgery, history of cesarean section 2 through midline incisions, and laparoscopic supracervical hysterectomy; she does have morbid obesity.   08/31/2016 ultrasound report FINDINGS: Uterus  Surgically absent.  Right ovary  Discrete ovarian tissue is difficult to visualize, but likely present adjacent of the cysts, with blood flow within the tentatively identified ovarian tissue. There are 2 complex cystic regions in the right adnexa measuring 3.6 x 3.1 x 3.1 and an adjacent 2.2 x 1.6 x 1.9 cm. There are thin internal septations within both of the cysts.  Left ovary  Not visualized. No adnexal mass.  Other findings  No abnormal free fluid.  IMPRESSION: Two adjacent mildly complex right ovarian cysts, larger measuring 3.6 cm. There are no suspicious imaging characteristics, however given right-sided  pain, could consider follow-up pelvic ultrasound in 6-12 weeks.  08/31/2016 CT scan report: IMPRESSION: 1. Normal-appearing appendix. No nephrolithiasis nor hydroureteronephrosis. 2. Two simple appearing right ovarian follicles/cysts are noted possibly contributing to the patient's pain, the largest is 3.3 cm. 3. Neurostimulator lead seen at the T7-T8 level. PLIF at L5/S1. 4. Hysterectomy. 5. Gastric bypass.     Gynecologic History No LMP recorded. Patient has had a hysterectomy.  OB History    Gravida Para Term Preterm AB Living   2 2 2     2    SAB TAB Ectopic Multiple Live Births           2      No LMP recorded. Patient has had a hysterectomy.    Past Medical History:  Diagnosis Date  . Anxiety   . Arthritis    lower spine  . Chronic back pain   . Clinical depression 04/04/2012  . Depression   . Headache(784.0)    migraines  . Ovarian cyst   . Pneumonia    walking pneumonia  . Sleep apnea    no cpcap     last sleep study > 4 yrs    Past Surgical History:  Procedure Laterality Date  . ABDOMINAL HYSTERECTOMY  10/2006   cervix and ovaries remain- [facility]  . BACK SURGERY  2014   lumbar fusion  . BACK SURGERY  2009   discectomy  . BREAST REDUCTION SURGERY    . c sections    . GASTRIC BYPASS  2005  . REDUCTION MAMMAPLASTY Bilateral    2001  . SPINAL CORD STIMULATOR INSERTION N/A 03/20/2016   Procedure: LUMBAR SPINAL CORD STIMULATOR INSERTION;  Surgeon: Eustace Moore, MD;  Location: [facility];  Service: Neurosurgery;  Laterality: N/A;  . TONSILLECTOMY  OB History  Gravida Para Term Preterm AB Living  2 2 2     2   SAB TAB Ectopic Multiple Live Births          2    # Outcome Date GA Lbr Len/2nd Weight Sex Delivery Anes PTL Lv  2 Term 1995   9 lb 8 oz (4.309 kg) M CS-LTranv   LIV  1 Term 1989   8 lb 2.4 oz (3.697 kg) F CS-LTranv   LIV      Social History   Social History  . Marital status: Single    Spouse name: N/A  . Number of children:  N/A  . Years of education: N/A   Social History Main Topics  . Smoking status: Never Smoker  . Smokeless tobacco: Never Used  . Alcohol use No  . Drug use: No  . Sexual activity: Not Currently    Birth control/ protection: Surgical   Other Topics Concern  . None   Social History Narrative  . None    Family History  Problem Relation Age of Onset  . Diabetes Mother   . Hypertension Mother   . Diabetes Sister   . Hypertension Father   . Hypertension Sister   . Breast cancer Neg Hx      (Not in a hospital admission)  Allergies  Allergen Reactions  . Morphine And Related Hives, Shortness Of Breath, Nausea And Vomiting and Other (See Comments)    "can't breathe"  . Penicillins Anaphylaxis, Hives, Shortness Of Breath and Nausea And Vomiting    "can't breathe", Has patient had a PCN reaction causing immediate rash, facial/tongue/throat swelling, SOB or lightheadedness with hypotension: Yes Has patient had a PCN reaction causing severe rash involving mucus membranes or skin necrosis: No Has patient had a PCN reaction that required hospitalization No Has patient had a PCN reaction occurring within the last 10 years: No If all of the above answers are "NO", then may proceed with Cephalosporin use.   . Latex Hives  . Baclofen Nausea Only    Jittery, anxious    Review of Systems Constitutional: No recent fever/chills/sweats Respiratory: No recent cough/bronchitis Cardiovascular: No chest pain Gastrointestinal: No recent nausea/vomiting/diarrhea Genitourinary: No UTI symptoms Hematologic/lymphatic:No history of coagulopathy or recent blood thinner use    Objective:    BP (!) 145/79   Pulse 76   Ht 5\' 7"  (1.702 m)   Wt (!) 343 lb 8 oz (155.8 kg)   BMI 53.80 kg/m   General:   Normal  Skin:   normal  HEENT:  Normal  Neck:  Supple without Adenopathy or Thyromegaly  Lungs:   Heart:              Breasts:   Abdomen:  Pelvis:  M/S   Extremeties:  Neuro:    clear  to auscultation bilaterally   Normal without murmur   Not Examined   soft, non-tender; bowel sounds normal; no masses,  no organomegaly   Exam deferred to OR  No CVAT  Warm/Dry   Normal       10/01/2016 Pelvic exam: External genitalia normal BUS-normal Vagina-normal Cervix-no cervical motion tenderness Uterus-30 with absent Adnexa-nonpalpable; bilateral tenderness 2/4 Rectovaginal-normal external exam  Assessment:    1. Complex right ovarian cysts 2. Status post [place] 3. History of cesarean section 2 4. History of gastric bypass 5. Morbid obesity   Plan:   Laparoscopic BSO  Patient was counseled regarding the planned procedure. She is aware of and  is accepting of all surgical risks which include but are not limited to bleeding, infection, pelvic organ injury with need for repair, blood clot disorders, anesthesia risks, etc. She does understand that removal of both ovaries will make her surgically menopausal and she may require estrogen replacement therapy for management of vasomotor symptoms and prevention of osteoporosis. The patient does understand that she has multiple comorbidities which can make the surgery complicated/prolonged. All questions have been answered. Patient is ready and willing to proceed with surgery as scheduled.  Brayton Mars, MD  Note: This dictation was prepared with Dragon dictation along with smaller phrase technology. Any transcriptional errors that result from this process are unintentional.

## 2016-10-05 NOTE — Anesthesia Preprocedure Evaluation (Signed)
Anesthesia Evaluation  Patient identified by MRN, date of birth, ID band Patient awake    Reviewed: Allergy & Precautions, H&P , NPO status , Patient's Chart, lab work & pertinent test results, reviewed documented beta blocker date and time   Airway Mallampati: II  TM Distance: >3 FB Neck ROM: full    Dental  (+) Teeth Intact   Pulmonary neg pulmonary ROS, neg shortness of breath, sleep apnea and Continuous Positive Airway Pressure Ventilation , pneumonia, resolved,    Pulmonary exam normal        Cardiovascular negative cardio ROS Normal cardiovascular exam Rhythm:regular Rate:Normal     Neuro/Psych  Headaches, PSYCHIATRIC DISORDERS  Neuromuscular disease negative neurological ROS  negative psych ROS   GI/Hepatic negative GI ROS, Neg liver ROS,   Endo/Other  negative endocrine ROS  Renal/GU negative Renal ROS  negative genitourinary   Musculoskeletal   Abdominal   Peds  Hematology negative hematology ROS (+)   Anesthesia Other Findings Past Medical History: No date: Anxiety No date: Arthritis     Comment: lower spine No date: Chronic back pain 04/04/2012: Clinical depression No date: Depression No date: Headache(784.0)     Comment: migraines No date: Ovarian cyst No date: Pneumonia     Comment: walking pneumonia No date: Sleep apnea     Comment: no cpcap     last sleep study > 4 yrs Past Surgical History: 10/2006: ABDOMINAL HYSTERECTOMY     Comment: cervix and ovaries remain- [place] 2014: BACK SURGERY     Comment: lumbar fusion 2009: BACK SURGERY     Comment: discectomy No date: BREAST REDUCTION SURGERY No date: c sections 2005: GASTRIC BYPASS No date: REDUCTION MAMMAPLASTY Bilateral     Comment: 2001 03/20/2016: SPINAL CORD STIMULATOR INSERTION N/A     Comment: Procedure: LUMBAR SPINAL CORD STIMULATOR               INSERTION;  Surgeon: Eustace Moore, MD;                Location: [facility];  Service:  Neurosurgery;              Laterality: N/A; No date: TONSILLECTOMY   Reproductive/Obstetrics negative OB ROS                             Anesthesia Physical Anesthesia Plan  ASA: III  Anesthesia Plan: General ETT   Post-op Pain Management:    Induction:   Airway Management Planned:   Additional Equipment:   Intra-op Plan:   Post-operative Plan:   Informed Consent: I have reviewed the patients History and Physical, chart, labs and discussed the procedure including the risks, benefits and alternatives for the proposed anesthesia with the patient or authorized representative who has indicated his/her understanding and acceptance.   Dental Advisory Given  Plan Discussed with: CRNA  Anesthesia Plan Comments:         Anesthesia Quick Evaluation

## 2016-10-05 NOTE — Progress Notes (Signed)
Dr. Enzo Bi aware by phone that type and screen Results are not resulted in computer.  Ok to proceed.

## 2016-10-06 ENCOUNTER — Encounter: Payer: Self-pay | Admitting: Obstetrics and Gynecology

## 2016-10-06 DIAGNOSIS — N8302 Follicular cyst of left ovary: Secondary | ICD-10-CM | POA: Diagnosis not present

## 2016-10-06 DIAGNOSIS — F419 Anxiety disorder, unspecified: Secondary | ICD-10-CM | POA: Diagnosis not present

## 2016-10-06 DIAGNOSIS — Z90710 Acquired absence of both cervix and uterus: Secondary | ICD-10-CM | POA: Diagnosis not present

## 2016-10-06 DIAGNOSIS — D270 Benign neoplasm of right ovary: Secondary | ICD-10-CM | POA: Diagnosis not present

## 2016-10-06 DIAGNOSIS — Z9884 Bariatric surgery status: Secondary | ICD-10-CM | POA: Diagnosis not present

## 2016-10-06 DIAGNOSIS — N736 Female pelvic peritoneal adhesions (postinfective): Secondary | ICD-10-CM | POA: Diagnosis not present

## 2016-10-06 DIAGNOSIS — R102 Pelvic and perineal pain: Secondary | ICD-10-CM | POA: Diagnosis not present

## 2016-10-06 DIAGNOSIS — N7011 Chronic salpingitis: Secondary | ICD-10-CM | POA: Diagnosis not present

## 2016-10-06 DIAGNOSIS — G8929 Other chronic pain: Secondary | ICD-10-CM | POA: Diagnosis not present

## 2016-10-06 LAB — HEMOGLOBIN: Hemoglobin: 11.3 g/dL — ABNORMAL LOW (ref 12.0–16.0)

## 2016-10-06 LAB — RPR: RPR Ser Ql: NONREACTIVE

## 2016-10-06 MED ORDER — OXYCODONE-ACETAMINOPHEN 5-325 MG PO TABS: 1.0000 | ORAL_TABLET | ORAL | 0 refills | Status: DC | PRN

## 2016-10-06 MED ORDER — IBUPROFEN 800 MG PO TABS: 800.0000 mg | ORAL_TABLET | Freq: Three times a day (TID) | ORAL | 1 refills | Status: DC

## 2016-10-06 NOTE — Anesthesia Postprocedure Evaluation (Signed)
Anesthesia Post Note  Patient: Kimberly Cowan  Procedure(s) Performed: Procedure(s) (LRB): LAPAROSCOPIC SALPINGO OOPHORECTOMY (Bilateral)  Patient location during evaluation: PACU Anesthesia Type: General Level of consciousness: awake and alert Pain management: pain level controlled Vital Signs Assessment: post-procedure vital signs reviewed and stable Respiratory status: spontaneous breathing, nonlabored ventilation, respiratory function stable and patient connected to nasal cannula oxygen Cardiovascular status: blood pressure returned to baseline and stable Postop Assessment: no signs of nausea or vomiting Anesthetic complications: no     Last Vitals:  Vitals:   10/06/16 0325 10/06/16 0826  BP: 131/63 133/80  Pulse: 80 75  Resp: 18 18  Temp: 37 C 36.8 C    Last Pain:  Vitals:   10/06/16 0853  TempSrc:   PainSc: [place]

## 2016-10-06 NOTE — Discharge Summary (Signed)
Physician Discharge Summary  Patient ID: Kimberly Cowan MRN: [id number] DOB/AGE: 11-16-1965 51 y.o.  Admit date: 10/05/2016 Discharge date: 10/06/2016  Admission Diagnoses: Rt Ovarian Cyst and Chronic Pelvic Pain  Discharge Diagnoses:  Rt Ovarian Cyst and Chronic Pelvic Pain  Operative Procedures: Procedure(s): LAPAROSCOPIC SALPINGO OOPHORECTOMY (Bilateral)  Hospital Course: Uncomplicated   Significant Diagnostic Studies:  Lab Results  Component Value Date   HGB 11.3 (L) 10/06/2016   HGB 11.6 (L) 10/05/2016   HGB 11.9 (L) 08/31/2016   Lab Results  Component Value Date   HCT 34.8 (L) 10/05/2016   HCT 36.3 08/31/2016   HCT 35.6 (L) 03/20/2016   CBC Latest Ref Rng & Units 10/06/2016 10/05/2016 08/31/2016  WBC 3.6 - 11.0 K/uL - 3.9 5.2  Hemoglobin 12.0 - 16.0 g/dL 11.3(L) 11.6(L) 11.9(L)  Hematocrit 35.0 - 47.0 % - 34.8(L) 36.3  Platelets 150 - 440 K/uL - 254 229     Discharged Condition: good  Discharge Exam: Blood pressure 133/80, pulse 75, temperature 98.2 F (36.8 C), temperature source Oral, resp. rate 18, height 5\' 7"  (1.702 m), weight (!) 343 lb (155.6 kg), SpO2 98 %. Incision/Wound: clean, dry and no drainage  Disposition: 01-Home or Self Care   Allergies as of 10/06/2016      Reactions   Morphine And Related Hives, Shortness Of Breath, Nausea And Vomiting, Other (See Comments)   "can't breathe"   Penicillins Anaphylaxis, Hives, Shortness Of Breath, Nausea And Vomiting   "can't breathe", Has patient had a PCN reaction causing immediate rash, facial/tongue/throat swelling, SOB or lightheadedness with hypotension: Yes Has patient had a PCN reaction causing severe rash involving mucus membranes or skin necrosis: No Has patient had a PCN reaction that required hospitalization No Has patient had a PCN reaction occurring within the last 10 years: No If all of the above answers are "NO", then may proceed with Cephalosporin use.   Latex Hives   Baclofen  Nausea Only   Jittery, anxious      Medication List    TAKE these medications   Cetirizine HCl 10 MG Caps Take 1 capsule (10 mg total) by mouth daily.   cyclobenzaprine 10 MG tablet Commonly known as:  FLEXERIL Take 1 tablet (10 mg total) by mouth 3 (three) times daily as needed. What changed:  reasons to take this   DULoxetine 30 MG capsule Commonly known as:  CYMBALTA Take 30 mg by mouth 2 (two) times daily.   fluticasone 50 MCG/ACT nasal spray Commonly known as:  FLONASE Place 2 sprays into both nostrils daily.   gabapentin 300 MG capsule Commonly known as:  NEURONTIN Take 600 mg by mouth at bedtime.   ibuprofen 800 MG tablet Commonly known as:  ADVIL,MOTRIN Take 1 tablet (800 mg total) by mouth 3 (three) times daily. What changed:  when to take this  reasons to take this   latanoprost 0.005 % ophthalmic solution Commonly known as:  XALATAN Place 1 drop into both eyes at bedtime.   oxyCODONE-acetaminophen 5-325 MG tablet Commonly known as:  ROXICET Take 1-2 tablets by mouth every 4 (four) hours as needed for moderate pain or severe pain. What changed:  how much to take  reasons to take this   rizatriptan 10 MG disintegrating tablet Commonly known as:  MAXALT-MLT TAKE 1 TABLET BY MOUTH AS NEEDED FOR MIGRAINE. MAY REPEAT IN 2 HOURS IF NEEDED.      Follow-up Information    Brayton Mars, MD Follow up in 1 week(s).  Specialties:  Obstetrics and Gynecology, Radiology Why:  Post Op Check Contact information: [address] [place] [address] [phone]           Signed: Alanda Slim Cailen Mihalik 10/06/2016, 12:00 PM

## 2016-10-06 NOTE — Progress Notes (Signed)
Patient discharged home. Discharge instructions, prescriptions and follow up appointment given to and reviewed with patient. Patient verbalized understanding. Escorted out via wheelchair by auxiliary.  

## 2016-10-07 LAB — SURGICAL PATHOLOGY

## 2016-10-14 ENCOUNTER — Encounter: Payer: Commercial Managed Care - HMO | Admitting: Obstetrics and Gynecology

## 2016-10-14 DIAGNOSIS — G4733 Obstructive sleep apnea (adult) (pediatric): Secondary | ICD-10-CM | POA: Diagnosis not present

## 2016-10-14 DIAGNOSIS — M4800 Spinal stenosis, site unspecified: Secondary | ICD-10-CM | POA: Diagnosis not present

## 2016-10-16 ENCOUNTER — Ambulatory Visit (INDEPENDENT_AMBULATORY_CARE_PROVIDER_SITE_OTHER): Payer: Commercial Managed Care - HMO | Admitting: Obstetrics and Gynecology

## 2016-10-16 ENCOUNTER — Encounter: Payer: Self-pay | Admitting: Obstetrics and Gynecology

## 2016-10-16 VITALS — BP 126/80 | HR 87 | Ht 67.5 in | Wt 337.1 lb

## 2016-10-16 DIAGNOSIS — Z09 Encounter for follow-up examination after completed treatment for conditions other than malignant neoplasm: Secondary | ICD-10-CM

## 2016-10-16 DIAGNOSIS — N83201 Unspecified ovarian cyst, right side: Secondary | ICD-10-CM | POA: Diagnosis not present

## 2016-10-16 NOTE — Progress Notes (Signed)
HPI:      [name] is a 51 y.o. [id number] who LMP was No LMP recorded. Patient has had a hysterectomy..  Subjective: She presents today 1 week from surgery for ovarian cyst. She reports no problems. Her pain is controlled. She is eating, voiding, and having bowel movements without issue. She does complain of occasional hot flashes but she says that she and Dr. Keturah Barre I spoke about possibly starting patches.     Hx: The following portions of the patient's history were reviewed and updated as appropriate:           She  has a past medical history of Anxiety; Arthritis; Chronic back pain; Clinical depression (04/04/2012); Depression; Headache(784.0); Ovarian cyst; Pneumonia; and Sleep apnea. She  does not have any pertinent problems on file. She  has a past surgical history that includes c sections; Tonsillectomy; Gastric bypass (2005); Breast reduction surgery; Reduction mammaplasty (Bilateral); Back surgery (2014); Back surgery (2009); Spinal cord stimulator insertion (N/A, 03/20/2016); Abdominal hysterectomy (10/2006); Laparoscopic salpingo oophorectomy (Bilateral, 10/05/2016); and Pelvic laparoscopy. She has a current medication list which includes the following prescription(s): cetirizine hcl, cyclobenzaprine, duloxetine, fluticasone, gabapentin, ibuprofen, latanoprost, oxycodone-acetaminophen, and rizatriptan.        ROS: Constitutional: Denied constitutional symptoms, night sweats, recent illness, fatigue, fever, insomnia and weight loss.  Eyes: Denied eye symptoms, eye pain, photophobia, vision change and visual disturbance.  Ears/Nose/Throat/Neck: Denied ear, nose, throat or neck symptoms, hearing loss, nasal discharge, sinus congestion and sore throat.  Cardiovascular: Denied cardiovascular symptoms, arrhythmia, chest pain/pressure, edema, exercise intolerance, orthopnea and palpitations.  Respiratory: Denied pulmonary symptoms, asthma, pleuritic pain, productive sputum, cough,  dyspnea and wheezing.  Gastrointestinal: Denied, gastro-esophageal reflux, melena, nausea and vomiting.  Genitourinary: Denied genitourinary symptoms including symptomatic vaginal discharge, pelvic relaxation issues, and urinary complaints.  Musculoskeletal: Denied musculoskeletal symptoms, stiffness, swelling, muscle weakness and myalgia.  Dermatologic: Denied dermatology symptoms, rash and scar.  Neurologic: Denied neurology symptoms, dizziness, headache, neck pain and syncope.  Psychiatric: Denied psychiatric symptoms, anxiety and depression.  Endocrine: Denied endocrine symptoms including hot flashes and night sweats.     Objective: Vitals:   10/16/16 1346  BP: 126/80  Pulse: 87       Abdomen: Soft.  Non-tender.  No masses.  No HSM.  Incision/s: Intact.  Healing well.  No erythema.  No drainage.     Assessment: Postop check  Ovarian cyst, right - Removed at surgery - serous cystadenoma     Plan:        Orders No orders of the defined types were placed in this encounter.   1.  Wound care The patient has been instructed in wound care. An increase in redness, pain or swelling should prompt her to seek medical attention.         F/U  Return in about 4 weeks (around 11/13/2016).  Finis Bud, M.D. 10/16/2016 1:51 PM

## 2016-10-16 NOTE — Progress Notes (Signed)
Pt is here for a 1 week post op check.Pt has no c/o. Incision clean and dry- left side more sore than right. Denies discharge and pain.

## 2016-10-22 ENCOUNTER — Other Ambulatory Visit: Payer: Commercial Managed Care - HMO

## 2016-10-29 ENCOUNTER — Encounter: Payer: Commercial Managed Care - HMO | Admitting: Obstetrics and Gynecology

## 2016-11-03 DIAGNOSIS — M47816 Spondylosis without myelopathy or radiculopathy, lumbar region: Secondary | ICD-10-CM | POA: Diagnosis not present

## 2016-11-03 DIAGNOSIS — M5416 Radiculopathy, lumbar region: Secondary | ICD-10-CM | POA: Diagnosis not present

## 2016-11-03 DIAGNOSIS — F329 Major depressive disorder, single episode, unspecified: Secondary | ICD-10-CM | POA: Diagnosis not present

## 2016-11-03 DIAGNOSIS — G4733 Obstructive sleep apnea (adult) (pediatric): Secondary | ICD-10-CM | POA: Diagnosis not present

## 2016-11-08 ENCOUNTER — Encounter: Payer: Self-pay | Admitting: Internal Medicine

## 2016-11-09 ENCOUNTER — Encounter: Payer: Self-pay | Admitting: Internal Medicine

## 2016-11-09 ENCOUNTER — Ambulatory Visit (INDEPENDENT_AMBULATORY_CARE_PROVIDER_SITE_OTHER): Payer: Commercial Managed Care - HMO | Admitting: Internal Medicine

## 2016-11-09 ENCOUNTER — Ambulatory Visit: Payer: Commercial Managed Care - HMO

## 2016-11-09 VITALS — BP 130/72 | HR 89 | Temp 98.4°F | Ht 67.5 in | Wt 339.0 lb

## 2016-11-09 DIAGNOSIS — N760 Acute vaginitis: Secondary | ICD-10-CM | POA: Diagnosis not present

## 2016-11-09 DIAGNOSIS — E538 Deficiency of other specified B group vitamins: Secondary | ICD-10-CM

## 2016-11-09 DIAGNOSIS — B9689 Other specified bacterial agents as the cause of diseases classified elsewhere: Secondary | ICD-10-CM

## 2016-11-09 DIAGNOSIS — M67442 Ganglion, left hand: Secondary | ICD-10-CM | POA: Diagnosis not present

## 2016-11-09 LAB — POCT WET PREP WITH KOH
KOH Prep POC: NEGATIVE
RBC WET PREP PER HPF POC: 0
Trichomonas, UA: NEGATIVE
Yeast Wet Prep HPF POC: 0

## 2016-11-09 MED ORDER — METRONIDAZOLE 500 MG PO TABS: 500.0000 mg | ORAL_TABLET | Freq: Two times a day (BID) | ORAL | 0 refills | Status: DC

## 2016-11-09 MED ORDER — CYANOCOBALAMIN 1000 MCG/ML IJ SOLN
1000.0000 ug | Freq: Once | INTRAMUSCULAR | Status: AC
  Administered 2016-11-09: 1000 ug via INTRAMUSCULAR

## 2016-11-09 NOTE — Progress Notes (Signed)
Date:  11/09/2016   Name:  Kimberly Cowan   DOB:  04/07/1966   MRN:  [id number]   Chief Complaint: Vaginal Discharge (Pt stated having discharge, burning sensation, and odor for 4 days) Vaginal Discharge  The patient's primary symptoms include a genital odor and vaginal discharge. The patient's pertinent negatives include no pelvic pain. This is a new problem. The current episode started in the past 7 days. The problem occurs constantly. The problem has been unchanged. She is not pregnant. Pertinent negatives include no abdominal pain, dysuria, fever, hematuria or rash. She is not sexually active.  She recently had bilateral OOP laparoscopically.  No recent antibiotics. She is also due for her B12 injection. Joint mass - has a tender mass on her left index finger - not changing in size.  Does not limit movement.  Only tender with pressure.  No known injury.  Review of Systems  Constitutional: Negative for appetite change, fatigue, fever and unexpected weight change.  HENT: Negative for tinnitus and trouble swallowing.   Eyes: Positive for visual disturbance.  Respiratory: Negative for cough, chest tightness and shortness of breath.   Cardiovascular: Negative for chest pain, palpitations and leg swelling.  Gastrointestinal: Negative for abdominal pain.  Endocrine: Negative for polydipsia and polyuria.  Genitourinary: Positive for vaginal discharge. Negative for difficulty urinating, dysuria, genital sores, hematuria and pelvic pain.  Musculoskeletal: Positive for arthralgias.  Skin: Negative for rash.  Psychiatric/Behavioral: Negative for dysphoric mood.    Patient Active Problem List   Diagnosis Date Noted  . Status post bilateral salpingo-oophorectomy (BSO) 10/05/2016  . Morbid obesity ([place]) 09/10/2016  . Status post laparoscopic supracervical hysterectomy 09/10/2016  . Right lower quadrant pain 09/10/2016  . Right ovarian cyst 09/10/2016  . B12 nutritional  deficiency 08/05/2016  . Obstructive sleep apnea of adult 03/04/2016  . DDD (degenerative disc disease), lumbosacral 08/14/2015  . Bilateral lumbar radiculopathy 08/14/2015  . S/P spinal surgery 08/14/2015  . Status post gastric bypass for obesity 07/04/2015  . Hyperglycemia 07/01/2015  . Spinal stenosis, lumbar region, with neurogenic claudication 11/11/2012  . Clinical depression 04/04/2012  . Headache, migraine 04/04/2012    Prior to Admission medications   Medication Sig Start Date End Date Taking? Authorizing Provider  Cetirizine HCl 10 MG CAPS Take 1 capsule (10 mg total) by mouth daily. 01/16/16  Yes Cari B Triplett, FNP  cyclobenzaprine (FLEXERIL) 10 MG tablet Take 1 tablet (10 mg total) by mouth 3 (three) times daily as needed. Patient taking differently: Take 10 mg by mouth 3 (three) times daily as needed for muscle spasms.  08/19/16  Yes Sable Feil, PA-C  DULoxetine (CYMBALTA) 30 MG capsule Take 30 mg by mouth 2 (two) times daily.  02/05/16  Yes Historical Provider, MD  fluticasone (FLONASE) 50 MCG/ACT nasal spray Place 2 sprays into both nostrils daily. 04/22/16  Yes Glean Hess, MD  gabapentin (NEURONTIN) 300 MG capsule Take 600 mg by mouth at bedtime.  01/01/16  Yes Historical Provider, MD  ibuprofen (ADVIL,MOTRIN) 800 MG tablet Take 1 tablet (800 mg total) by mouth 3 (three) times daily. 10/06/16  Yes Alanda Slim Defrancesco, MD  latanoprost (XALATAN) 0.005 % ophthalmic solution Place 1 drop into both eyes at bedtime.    Yes Historical Provider, MD  oxyCODONE-acetaminophen (ROXICET) 5-325 MG tablet Take 1-2 tablets by mouth every 4 (four) hours as needed for moderate pain or severe pain. 10/06/16  Yes Alanda Slim Defrancesco, MD  rizatriptan (MAXALT-MLT) 10 MG disintegrating  tablet TAKE 1 TABLET BY MOUTH AS NEEDED FOR MIGRAINE. MAY REPEAT IN 2 HOURS IF NEEDED. 04/19/16  Yes Glean Hess, MD    Allergies  Allergen Reactions  . Morphine And Related Hives, Shortness Of Breath,  Nausea And Vomiting and Other (See Comments)    "can't breathe"  . Penicillins Anaphylaxis, Hives, Shortness Of Breath and Nausea And Vomiting    "can't breathe", Has patient had a PCN reaction causing immediate rash, facial/tongue/throat swelling, SOB or lightheadedness with hypotension: Yes Has patient had a PCN reaction causing severe rash involving mucus membranes or skin necrosis: No Has patient had a PCN reaction that required hospitalization No Has patient had a PCN reaction occurring within the last 10 years: No If all of the above answers are "NO", then may proceed with Cephalosporin use.   . Latex Hives  . Baclofen Nausea Only    Jittery, anxious    Past Surgical History:  Procedure Laterality Date  . ABDOMINAL HYSTERECTOMY  10/2006   cervix and ovaries remain- LSH  . BACK SURGERY  2014   lumbar fusion  . BACK SURGERY  2009   discectomy  . BREAST REDUCTION SURGERY    . c sections    . GASTRIC BYPASS  2005  . LAPAROSCOPIC SALPINGO OOPHERECTOMY Bilateral 10/05/2016   Procedure: LAPAROSCOPIC SALPINGO OOPHORECTOMY;  Surgeon: Brayton Mars, MD;  Location: [facility] ORS;  Service: Gynecology;  Laterality: Bilateral;  . PELVIC LAPAROSCOPY    . REDUCTION MAMMAPLASTY Bilateral    2001  . SPINAL CORD STIMULATOR INSERTION N/A 03/20/2016   Procedure: LUMBAR SPINAL CORD STIMULATOR INSERTION;  Surgeon: Eustace Moore, MD;  Location: [facility];  Service: Neurosurgery;  Laterality: N/A;  . TONSILLECTOMY      Social History  Substance Use Topics  . Smoking status: Never Smoker  . Smokeless tobacco: Never Used  . Alcohol use No     Medication list has been reviewed and updated.   Physical Exam  Constitutional: She is oriented to person, place, and time. She appears well-developed. No distress.  HENT:  Head: Normocephalic and atraumatic.  Pulmonary/Chest: Effort normal. No respiratory distress.  Abdominal: Soft.  Genitourinary: No erythema, tenderness or bleeding in the  vagina. Vaginal discharge found.  Musculoskeletal: Normal range of motion.  Small pea sized mobile cyst medial aspect of left PIP joint left hand.  Slightly tender.  Neurological: She is alert and oriented to person, place, and time.  Skin: Skin is warm and dry. No rash noted.  Psychiatric: She has a normal mood and affect. Her behavior is normal. Thought content normal.  Nursing note and vitals reviewed.   BP 130/72   Pulse 89   Temp 98.4 F (36.9 C)   Ht 5' 7.5" (1.715 m)   Wt (!) 339 lb (153.8 kg)   SpO2 94%   BMI 52.31 kg/m   Assessment and Plan: 1. BV (bacterial vaginosis) Patient educated - metroNIDAZOLE (FLAGYL) 500 MG tablet; Take 1 tablet (500 mg total) by mouth 2 (two) times daily.  Dispense: 14 tablet; Refill: 0 - POCT Wet Prep with KOH  2. Ganglion cyst of finger of left hand Reassurance - refer to Ortho if needed  3. B12 nutritional deficiency - cyanocobalamin ((VITAMIN B-12)) injection 1,000 mcg; Inject 1 mL (1,000 mcg total) into the muscle once.   Halina Maidens, MD [facility]  11/09/2016

## 2016-11-09 NOTE — Patient Instructions (Signed)

## 2016-11-11 ENCOUNTER — Ambulatory Visit (INDEPENDENT_AMBULATORY_CARE_PROVIDER_SITE_OTHER): Payer: Commercial Managed Care - HMO | Admitting: Obstetrics and Gynecology

## 2016-11-11 ENCOUNTER — Encounter: Payer: Self-pay | Admitting: Obstetrics and Gynecology

## 2016-11-11 ENCOUNTER — Encounter: Payer: Commercial Managed Care - HMO | Admitting: Obstetrics and Gynecology

## 2016-11-11 VITALS — BP 130/80 | HR 94 | Ht 67.5 in | Wt 340.1 lb

## 2016-11-11 DIAGNOSIS — Z09 Encounter for follow-up examination after completed treatment for conditions other than malignant neoplasm: Secondary | ICD-10-CM

## 2016-11-11 DIAGNOSIS — N7011 Chronic salpingitis: Secondary | ICD-10-CM | POA: Insufficient documentation

## 2016-11-11 DIAGNOSIS — D270 Benign neoplasm of right ovary: Secondary | ICD-10-CM

## 2016-11-11 DIAGNOSIS — N951 Menopausal and female climacteric states: Secondary | ICD-10-CM

## 2016-11-11 DIAGNOSIS — Z90722 Acquired absence of ovaries, bilateral: Secondary | ICD-10-CM

## 2016-11-11 HISTORY — DX: Menopausal and female climacteric states: N95.1

## 2016-11-11 MED ORDER — ESTRADIOL 1 MG PO TABS: 1.0000 mg | ORAL_TABLET | Freq: Every day | ORAL | 12 refills | Status: DC

## 2016-11-11 NOTE — Patient Instructions (Signed)
1. Resume all activities without restriction 2. Begin estradiol 1 mg a for estrogen replacement therapy 3. Return in 6 weeks for follow-up   Menopause and Hormone Replacement Therapy Introduction WHAT IS HORMONE REPLACEMENT THERAPY? Hormone replacement therapy (HRT) is the use of artificial (synthetic) hormones to replace hormones that your body stops producing during menopause. Menopause is the normal time of life when menstrual periods stop completely and the ovaries stop producing the female hormones estrogen and progesterone. This lack of hormones can affect your health and cause undesirable symptoms. HRT can relieve some of those symptoms. WHAT ARE MY OPTIONS FOR HRT? HRT may consist of the synthetic hormones estrogen and progestin, or it may consist of only estrogen (estrogen-only therapy). You and your health care provider will decide which form of HRT is best for you. If you choose to be on HRT and you have a uterus, estrogen and progestin are usually prescribed. Estrogen-only therapy is used for women who do not have a uterus. Possible options for taking HRT include:  Pills.  Patches.  Gels.  Sprays.  Vaginal cream.  Vaginal rings.  Vaginal inserts. The amount of hormone(s) that you take and how long you take the hormone(s) varies depending on your individual health. It is important to:  Begin HRT with the lowest possible dosage.  Stop HRT as soon as your health care provider tells you to stop.  Work with your health care provider so that you feel informed and comfortable with your decisions. WHAT ARE THE BENEFITS OF HRT? HRT can reduce the frequency and severity of menopausal symptoms. Benefits of HRT vary depending on the menopausal symptoms that you have, the severity of your symptoms, and your overall health. HRT may help to improve the following menopausal symptoms:  Hot flashes and night sweats. These are sudden feelings of heat that spread over the face and  body. The skin may turn red, like a blush. Night sweats are hot flashes that happen while you are sleeping or trying to sleep.  Bone loss (osteoporosis). The body loses calcium more quickly after menopause, causing the bones to become weaker. This can increase the risk for bone breaks (fractures).  Vaginal dryness. The lining of the vagina can become thin and dry, which can cause pain during sexual intercourse or cause infection, burning, or itching.  Urinary tract infections.  Urinary incontinence. This is a decreased ability to control when you urinate.  Irritability.  Short-term memory problems. WHAT ARE THE RISKS OF HRT? Risks of HRT vary depending on your individual health and medical history. Risks of HRT also depend on whether you receive both estrogen and progestin or you receive estrogen only.HRT may increase the risk of:  Spotting. This is when a small amount of bloodleaks from the vagina unexpectedly.  Endometrial cancer. This cancer is in the lining of the uterus (endometrium).  Breast cancer.  Increased density of breast tissue. This can make it harder to find breast cancer on a breast X-ray (mammogram).  Stroke.  Heart attack.  Blood clots.  Gallbladder disease. Risks of HRT can increase if you have any of the following conditions:  Endometrial cancer.  Liver disease.  Heart disease.  Breast cancer.  History of blood clots.  History of stroke. HOW SHOULD I CARE FOR MYSELF WHILE I AM ON HRT?  Take over-the-counter and prescription medicines only as told by your health care provider.  Get mammograms, pelvic exams, and medical checkups as often as told by your health care provider.  Have Pap tests done as often as told by your health care provider. A Pap test is sometimes called a Pap smear. It is a screening test that is used to check for signs of cancer of the cervix and vagina. A Pap test can also identify the presence of infection or precancerous  changes. Pap tests may be done:  Every 3 years, starting at age 27.  Every 5 years, starting after age 10, in combination with testing for human papillomavirus (HPV).  More often or less often depending on other medical conditions you have, your age, and other risk factors.  It is your responsibility to get your Pap test results. Ask your health care provider or the department performing the test when your results will be ready.  Keep all follow-up visits as told by your health care provider. This is important. WHEN SHOULD I SEEK MEDICAL CARE? Talk with your health care provider if:  You have any of these:  Pain or swelling in your legs.  Shortness of breath.  Chest pain.  Lumps or changes in your breasts or armpits.  Slurred speech.  Pain, burning, or bleeding when you urine.  You develop any of these:  Unusual vaginal bleeding.  Dizziness or headaches.  Weakness or numbness in any part of your arms or legs.  Pain in your abdomen. This information is not intended to replace advice given to you by your health care provider. Make sure you discuss any questions you have with your health care provider. Document Released: 06/13/2003 Document Revised: 02/20/2016 Document Reviewed: 03/18/2015  2017 Elsevier

## 2016-11-11 NOTE — Progress Notes (Signed)
Chief complaint: 1. Chronic pelvic pain 2. History of comments right ovarian cyst 3. Status post bilateral salpingo-oophorectomy  Patient presents for her final postop check after laparoscopic BSO on 10/05/2016.  Surgical findings included right hydrosalpinx and right ovarian cyst. There was a small left hydrosalpinx and a normal-appearing ovary. Laparoscopic BSO was performed Pathology from surgery: DIAGNOSIS:  A. OVARY AND FALLOPIAN TUBE, RIGHT; SALPINGO-OOPHORECTOMY:  - OVARY WITH BENIGN SEROUS CYSTADENOMA, 3.4 CM, AND FOLLICULAR CYST.  - PARATUBAL CYST, 0.8 CM; OTHERWISE UNREMARKABLE FALLOPIAN TUBE.   B. OVARY AND FALLOPIAN TUBE, LEFT; SALPINGO-OOPHORECTOMY:  - OVARY WITH FOLLICULAR CYSTS.  - UNREMARKABLE FIMBRIATED END OF FALLOPIAN TUBE; PROXIMAL TUBE NOT  IDENTIFIED.    Since surgery patient has had development of normal bowel and bladder function. She is not having any significant pelvic pain at this time. She is not taking any analgesics. Major concern is hot flashes and night sweats. She is interested in going on a trial of estrogen replacement therapy.  Past medical history, past surgical history, problem list, medications, and allergies are reviewed  OBJECTIVE: BP 130/80   Pulse 94   Ht 5' 7.5" (1.715 m)   Wt (!) 340 lb 1.6 oz (154.3 kg)   BMI 52.48 kg/m   Pleasant well-appearing female in no acute distress. Alert and oriented. Abdomen: Soft, nontender; large pannus; midline scar present; laparoscopy incisions are well approximated without evidence of hernia or infection Pelvic: Deferred  ASSESSMENT: 1. Status post laparoscopic BSO for pelvic pain and adnexal mass. History of LSH. 2. Pathology notable for benign serous cystadenoma and hydrosalpinx 3. New onset vasomotor symptoms since bilateral salpingo-oophorectomy  PLAN: 1. Resume all activities without restriction 2. Begin estradiol 1 mg a day 3. Return in 6 weeks for follow-up and further management  having.  Brayton Mars, MD   Note: This dictation was prepared with Dragon dictation along with smaller phrase technology. Any transcriptional errors that result from this process are unintentional.

## 2016-11-13 DIAGNOSIS — M546 Pain in thoracic spine: Secondary | ICD-10-CM | POA: Diagnosis not present

## 2016-11-14 DIAGNOSIS — G4733 Obstructive sleep apnea (adult) (pediatric): Secondary | ICD-10-CM | POA: Diagnosis not present

## 2016-11-14 DIAGNOSIS — M4800 Spinal stenosis, site unspecified: Secondary | ICD-10-CM | POA: Diagnosis not present

## 2016-11-16 DIAGNOSIS — M4800 Spinal stenosis, site unspecified: Secondary | ICD-10-CM | POA: Diagnosis not present

## 2016-11-16 DIAGNOSIS — G4733 Obstructive sleep apnea (adult) (pediatric): Secondary | ICD-10-CM | POA: Diagnosis not present

## 2016-11-30 DIAGNOSIS — M546 Pain in thoracic spine: Secondary | ICD-10-CM | POA: Diagnosis not present

## 2016-11-30 DIAGNOSIS — F1120 Opioid dependence, uncomplicated: Secondary | ICD-10-CM | POA: Diagnosis not present

## 2016-11-30 DIAGNOSIS — M48061 Spinal stenosis, lumbar region without neurogenic claudication: Secondary | ICD-10-CM | POA: Diagnosis not present

## 2016-11-30 DIAGNOSIS — Z79899 Other long term (current) drug therapy: Secondary | ICD-10-CM | POA: Diagnosis not present

## 2016-12-03 ENCOUNTER — Ambulatory Visit (INDEPENDENT_AMBULATORY_CARE_PROVIDER_SITE_OTHER): Payer: Commercial Managed Care - HMO

## 2016-12-03 DIAGNOSIS — E538 Deficiency of other specified B group vitamins: Secondary | ICD-10-CM | POA: Diagnosis not present

## 2016-12-03 MED ORDER — CYANOCOBALAMIN 1000 MCG/ML IJ SOLN
1000.0000 ug | Freq: Once | INTRAMUSCULAR | Status: AC
  Administered 2016-12-03: 1000 ug via INTRAMUSCULAR

## 2016-12-12 DIAGNOSIS — G4733 Obstructive sleep apnea (adult) (pediatric): Secondary | ICD-10-CM | POA: Diagnosis not present

## 2016-12-12 DIAGNOSIS — M4800 Spinal stenosis, site unspecified: Secondary | ICD-10-CM | POA: Diagnosis not present

## 2016-12-24 ENCOUNTER — Encounter: Payer: Commercial Managed Care - HMO | Admitting: Obstetrics and Gynecology

## 2017-01-04 ENCOUNTER — Ambulatory Visit (INDEPENDENT_AMBULATORY_CARE_PROVIDER_SITE_OTHER): Payer: Medicare HMO

## 2017-01-04 DIAGNOSIS — E538 Deficiency of other specified B group vitamins: Secondary | ICD-10-CM

## 2017-01-04 MED ORDER — CYANOCOBALAMIN 1000 MCG/ML IJ SOLN
1000.0000 ug | Freq: Once | INTRAMUSCULAR | Status: AC
  Administered 2017-01-04: 1000 ug via INTRAMUSCULAR

## 2017-01-12 DIAGNOSIS — G4733 Obstructive sleep apnea (adult) (pediatric): Secondary | ICD-10-CM | POA: Diagnosis not present

## 2017-01-12 DIAGNOSIS — M4800 Spinal stenosis, site unspecified: Secondary | ICD-10-CM | POA: Diagnosis not present

## 2017-01-30 ENCOUNTER — Encounter: Payer: Self-pay | Admitting: Internal Medicine

## 2017-02-02 DIAGNOSIS — H40153 Residual stage of open-angle glaucoma, bilateral: Secondary | ICD-10-CM | POA: Diagnosis not present

## 2017-02-02 DIAGNOSIS — H524 Presbyopia: Secondary | ICD-10-CM | POA: Diagnosis not present

## 2017-02-03 ENCOUNTER — Ambulatory Visit: Payer: Medicare HMO

## 2017-02-03 ENCOUNTER — Encounter: Payer: Self-pay | Admitting: Internal Medicine

## 2017-02-03 ENCOUNTER — Ambulatory Visit (INDEPENDENT_AMBULATORY_CARE_PROVIDER_SITE_OTHER): Payer: Medicare HMO | Admitting: Internal Medicine

## 2017-02-03 VITALS — BP 138/84 | HR 91 | Ht 67.5 in | Wt 339.4 lb

## 2017-02-03 DIAGNOSIS — B360 Pityriasis versicolor: Secondary | ICD-10-CM | POA: Diagnosis not present

## 2017-02-03 DIAGNOSIS — E538 Deficiency of other specified B group vitamins: Secondary | ICD-10-CM

## 2017-02-03 MED ORDER — SELENIUM SULFIDE 1 % EX LOTN: 1.0000 "application " | TOPICAL_LOTION | Freq: Every day | CUTANEOUS | 1 refills | Status: DC

## 2017-02-03 MED ORDER — CYANOCOBALAMIN 1000 MCG/ML IJ SOLN
1000.0000 ug | Freq: Once | INTRAMUSCULAR | Status: AC
  Administered 2017-02-03: 1000 ug via INTRAMUSCULAR

## 2017-02-03 NOTE — Progress Notes (Signed)
Date:  02/03/2017   Name:  Kimberly Cowan   DOB:  Oct 05, 1965   MRN:  [id number]   Chief Complaint: Rash (Rash Is located on neck and chest. Had all life but gets worse in the summer time. Told its some type of fungus rash. ) Rash  This is a new problem. The current episode started in the past 7 days. The problem has been gradually worsening since onset. The affected locations include the neck, back and chest. The rash is characterized by itchiness. She was exposed to nothing. Pertinent negatives include no shortness of breath.      Review of Systems  Constitutional: Negative for chills.  Respiratory: Negative for chest tightness and shortness of breath.   Cardiovascular: Negative for chest pain.  Skin: Positive for color change and rash.  Psychiatric/Behavioral: Positive for dysphoric mood (tearful today but does not want to dicuss anything).    Patient Active Problem List   Diagnosis Date Noted  . Tinea versicolor 02/03/2017  . Vasomotor symptoms due to menopause 11/11/2016  . Status post bilateral salpingo-oophorectomy (BSO) 10/05/2016  . Morbid obesity ([place]) 09/10/2016  . Status post laparoscopic supracervical hysterectomy 09/10/2016  . Right lower quadrant pain 09/10/2016  . B12 nutritional deficiency 08/05/2016  . Obstructive sleep apnea of adult 03/04/2016  . Bilateral lumbar radiculopathy 08/14/2015  . S/P spinal surgery 08/14/2015  . Status post gastric bypass for obesity 07/04/2015  . Hyperglycemia 07/01/2015  . Spinal stenosis, lumbar region, with neurogenic claudication 11/11/2012  . Clinical depression 04/04/2012  . Headache, migraine 04/04/2012    Prior to Admission medications   Medication Sig Start Date End Date Taking? Authorizing Provider  Cetirizine HCl 10 MG CAPS Take 1 capsule (10 mg total) by mouth daily. 01/16/16   Triplett, Johnette Abraham B, FNP  cyclobenzaprine (FLEXERIL) 10 MG tablet Take 1 tablet (10 mg total) by mouth 3 (three) times daily as  needed. Patient taking differently: Take 10 mg by mouth 3 (three) times daily as needed for muscle spasms.  08/19/16   Sable Feil, PA-C  DULoxetine (CYMBALTA) 30 MG capsule Take 30 mg by mouth 2 (two) times daily.  02/05/16   [provider]  estradiol (ESTRACE) 1 MG tablet Take 1 tablet (1 mg total) by mouth daily. 11/11/16   Defrancesco, Alanda Slim, MD  fluticasone (FLONASE) 50 MCG/ACT nasal spray Place 2 sprays into both nostrils daily. 04/22/16   Glean Hess, MD  gabapentin (NEURONTIN) 300 MG capsule Take 600 mg by mouth at bedtime.  01/01/16   [provider]  ibuprofen (ADVIL,MOTRIN) 800 MG tablet Take 1 tablet (800 mg total) by mouth 3 (three) times daily. 10/06/16   Defrancesco, Alanda Slim, MD  latanoprost (XALATAN) 0.005 % ophthalmic solution Place 1 drop into both eyes at bedtime.     [provider]  metroNIDAZOLE (FLAGYL) 500 MG tablet Take 1 tablet (500 mg total) by mouth 2 (two) times daily. 11/09/16   Glean Hess, MD  oxyCODONE-acetaminophen (ROXICET) 5-325 MG tablet Take 1-2 tablets by mouth every 4 (four) hours as needed for moderate pain or severe pain. 10/06/16   Defrancesco, Alanda Slim, MD  rizatriptan (MAXALT-MLT) 10 MG disintegrating tablet TAKE 1 TABLET BY MOUTH AS NEEDED FOR MIGRAINE. MAY REPEAT IN 2 HOURS IF NEEDED. 04/19/16   Glean Hess, MD    Allergies  Allergen Reactions  . Morphine And Related Hives, Shortness Of Breath, Nausea And Vomiting and Other (See Comments)    "can't  breathe"  . Penicillins Anaphylaxis, Hives, Shortness Of Breath and Nausea And Vomiting    "can't breathe", Has patient had a PCN reaction causing immediate rash, facial/tongue/throat swelling, SOB or lightheadedness with hypotension: Yes Has patient had a PCN reaction causing severe rash involving mucus membranes or skin necrosis: No Has patient had a PCN reaction that required hospitalization No Has patient had a PCN reaction occurring within the last 10 years:  No If all of the above answers are "NO", then may proceed with Cephalosporin use.   . Latex Hives  . Baclofen Nausea Only    Jittery, anxious    Past Surgical History:  Procedure Laterality Date  . ABDOMINAL HYSTERECTOMY  10/2006  . BACK SURGERY  2014   lumbar fusion  . BACK SURGERY  2009   discectomy  . BREAST REDUCTION SURGERY    . c sections    . GASTRIC BYPASS  2005  . LAPAROSCOPIC SALPINGO OOPHERECTOMY Bilateral 10/05/2016   Procedure: LAPAROSCOPIC SALPINGO OOPHORECTOMY;  Surgeon: Brayton Mars, MD;  Location: [facility] ORS;  Service: Gynecology;  Laterality: Bilateral;  . PELVIC LAPAROSCOPY    . REDUCTION MAMMAPLASTY Bilateral    2001  . SPINAL CORD STIMULATOR INSERTION N/A 03/20/2016   Procedure: LUMBAR SPINAL CORD STIMULATOR INSERTION;  Surgeon: Eustace Moore, MD;  Location: [facility];  Service: Neurosurgery;  Laterality: N/A;  . TONSILLECTOMY      Social History  Substance Use Topics  . Smoking status: Never Smoker  . Smokeless tobacco: Never Used  . Alcohol use No     Medication list has been reviewed and updated.   Physical Exam  Constitutional: She is oriented to person, place, and time. She appears well-developed. No distress.  HENT:  Head: Normocephalic and atraumatic.  Cardiovascular: Normal rate, regular rhythm and normal heart sounds.   Pulmonary/Chest: Effort normal and breath sounds normal. No respiratory distress. She has no wheezes.  Musculoskeletal: Normal range of motion.  Neurological: She is alert and oriented to person, place, and time.  Skin: Skin is warm and dry. Rash noted. Rash is papular.  Rash over back, chest and neck c/w tinea versicolor  Psychiatric: Her behavior is normal. Thought content normal. She exhibits a depressed mood (tearful).  Nursing note and vitals reviewed.   BP 138/84 (BP Location: Right Wrist, Patient Position: Sitting, Cuff Size: Normal)   Pulse 91   Ht 5' 7.5" (1.715 m)   Wt (!) 339 lb 6.4 oz (154 kg)    SpO2 97%   BMI 52.37 kg/m   Assessment and Plan: 1. Tinea versicolor - selenium sulfide (SELSUN) 1 % LOTN; Apply 1 application topically daily.  Dispense: 420 mL; Refill: 1  2. B12 nutritional deficiency - cyanocobalamin ((VITAMIN B-12)) injection 1,000 mcg; Inject 1 mL (1,000 mcg total) into the muscle once.   Meds ordered this encounter  Medications  . selenium sulfide (SELSUN) 1 % LOTN    Sig: Apply 1 application topically daily.    Dispense:  420 mL    Refill:  1  . cyanocobalamin ((VITAMIN B-12)) injection 1,000 mcg    Halina Maidens, MD [facility]  02/03/2017

## 2017-02-04 ENCOUNTER — Telehealth: Payer: Self-pay | Admitting: Internal Medicine

## 2017-02-04 NOTE — Telephone Encounter (Signed)
Please contact pt and let her know to contact her insurance company to find out what they will approve and then call us back to let us know the name. We have no way of knowing what her insurance will approve.

## 2017-02-04 NOTE — Telephone Encounter (Signed)
Patient called stating that the [facility] rx is not covered by insurance and she needs an rx for one that is. Patient is using the [facility] in [place]. Please advise.

## 2017-02-05 ENCOUNTER — Other Ambulatory Visit: Payer: Self-pay

## 2017-02-05 DIAGNOSIS — B360 Pityriasis versicolor: Secondary | ICD-10-CM

## 2017-02-05 MED ORDER — KETOCONAZOLE 2 % EX SHAM: MEDICATED_SHAMPOO | Freq: Every day | CUTANEOUS | Status: DC | PRN

## 2017-02-05 NOTE — Telephone Encounter (Signed)
Shampoo sent into pharmacy.

## 2017-02-05 NOTE — Telephone Encounter (Signed)
Shampoo needed is ketoconazole 2% [facility]

## 2017-02-05 NOTE — Telephone Encounter (Signed)
Pt made aware, verbalized understanding and said she woulnd't hear anything back until Monday.

## 2017-02-08 ENCOUNTER — Other Ambulatory Visit: Payer: Self-pay

## 2017-02-08 DIAGNOSIS — B360 Pityriasis versicolor: Secondary | ICD-10-CM

## 2017-02-08 MED ORDER — KETOCONAZOLE 2 % EX SHAM: 1.0000 "application " | MEDICATED_SHAMPOO | CUTANEOUS | 0 refills | Status: DC

## 2017-02-11 DIAGNOSIS — M4800 Spinal stenosis, site unspecified: Secondary | ICD-10-CM | POA: Diagnosis not present

## 2017-02-11 DIAGNOSIS — G4733 Obstructive sleep apnea (adult) (pediatric): Secondary | ICD-10-CM | POA: Diagnosis not present

## 2017-02-16 DIAGNOSIS — G4733 Obstructive sleep apnea (adult) (pediatric): Secondary | ICD-10-CM | POA: Diagnosis not present

## 2017-02-16 DIAGNOSIS — M4800 Spinal stenosis, site unspecified: Secondary | ICD-10-CM | POA: Diagnosis not present

## 2017-03-04 DIAGNOSIS — G4733 Obstructive sleep apnea (adult) (pediatric): Secondary | ICD-10-CM | POA: Diagnosis not present

## 2017-03-04 DIAGNOSIS — F329 Major depressive disorder, single episode, unspecified: Secondary | ICD-10-CM | POA: Diagnosis not present

## 2017-03-04 DIAGNOSIS — M5416 Radiculopathy, lumbar region: Secondary | ICD-10-CM | POA: Diagnosis not present

## 2017-03-04 DIAGNOSIS — F419 Anxiety disorder, unspecified: Secondary | ICD-10-CM | POA: Diagnosis not present

## 2017-03-08 ENCOUNTER — Ambulatory Visit (INDEPENDENT_AMBULATORY_CARE_PROVIDER_SITE_OTHER): Payer: Medicare HMO

## 2017-03-08 DIAGNOSIS — D519 Vitamin B12 deficiency anemia, unspecified: Secondary | ICD-10-CM | POA: Diagnosis not present

## 2017-03-08 MED ORDER — CYANOCOBALAMIN 1000 MCG/ML IJ SOLN
1000.0000 ug | Freq: Once | INTRAMUSCULAR | Status: AC
  Administered 2017-03-08: 1000 ug via INTRAMUSCULAR

## 2017-03-14 DIAGNOSIS — G4733 Obstructive sleep apnea (adult) (pediatric): Secondary | ICD-10-CM | POA: Diagnosis not present

## 2017-03-14 DIAGNOSIS — M4800 Spinal stenosis, site unspecified: Secondary | ICD-10-CM | POA: Diagnosis not present

## 2017-04-07 ENCOUNTER — Encounter: Payer: Self-pay | Admitting: Internal Medicine

## 2017-04-07 ENCOUNTER — Other Ambulatory Visit: Payer: Self-pay | Admitting: Internal Medicine

## 2017-04-07 ENCOUNTER — Ambulatory Visit (INDEPENDENT_AMBULATORY_CARE_PROVIDER_SITE_OTHER): Payer: Medicare HMO | Admitting: Internal Medicine

## 2017-04-07 VITALS — BP 128/68 | HR 92 | Ht 67.5 in | Wt 343.0 lb

## 2017-04-07 DIAGNOSIS — E538 Deficiency of other specified B group vitamins: Secondary | ICD-10-CM | POA: Diagnosis not present

## 2017-04-07 DIAGNOSIS — J3089 Other allergic rhinitis: Secondary | ICD-10-CM

## 2017-04-07 DIAGNOSIS — R600 Localized edema: Secondary | ICD-10-CM

## 2017-04-07 DIAGNOSIS — R7303 Prediabetes: Secondary | ICD-10-CM

## 2017-04-07 DIAGNOSIS — G8929 Other chronic pain: Secondary | ICD-10-CM | POA: Diagnosis not present

## 2017-04-07 DIAGNOSIS — M25562 Pain in left knee: Secondary | ICD-10-CM | POA: Diagnosis not present

## 2017-04-07 DIAGNOSIS — F324 Major depressive disorder, single episode, in partial remission: Secondary | ICD-10-CM

## 2017-04-07 MED ORDER — TRIAMTERENE-HCTZ 37.5-25 MG PO TABS: 1.0000 | ORAL_TABLET | Freq: Every day | ORAL | 0 refills | Status: DC | PRN

## 2017-04-07 MED ORDER — CETIRIZINE HCL 10 MG PO CAPS: 10.0000 mg | ORAL_CAPSULE | Freq: Every day | ORAL | 12 refills | Status: DC

## 2017-04-07 MED ORDER — CYANOCOBALAMIN 1000 MCG/ML IJ SOLN
1000.0000 ug | Freq: Once | INTRAMUSCULAR | Status: AC
  Administered 2017-04-07: 1000 ug via INTRAMUSCULAR

## 2017-04-07 MED ORDER — CYANOCOBALAMIN 1000 MCG/ML IJ SOLN
1000.0000 ug | Freq: Once | INTRAMUSCULAR | Status: AC
  Administered 2018-04-19: 1000 ug via INTRAMUSCULAR

## 2017-04-07 NOTE — Progress Notes (Signed)
Date:  04/07/2017   Name:  Kimberly Cowan   DOB:  26-Dec-1965   MRN:  [id number]   Chief Complaint: Allergic Rhinitis  (Was given Certrizine in ER and would like script refilled for this. ); B12 Injection (Needs injection- would like Rx for this to be able to do injections at home. Did this before at home herself. ); and Edema (Right ankle, leg, and foot swelling- been swelling for a month now. Noticed it when standing - can feel its swollen.  ) Diabetes  She presents for her follow-up diabetic visit. Diabetes type: prediabetes. Pertinent negatives for hypoglycemia include no nervousness/anxiousness. Pertinent negatives for diabetes include no chest pain, no fatigue and no polyuria.   Allergies - on zyrtec and flonase.  Zyrtec given by ER is working well.  No recent sinus infections.  No asthma or wheezing  B12 deficiency - getting B12 injections monthly.  Wants to do at home.  Edema - right leg and ankle for the past month.  It goes down over night and worsens during the day.  Not painful but tight.  Not eating salt and drinking 90+ oz water per day.  Depression - doing much better on cymbalta twice a day.  Mood is lifted and she is very active.  Knee pain - left knee is hurting more, popping and limiting her ability to exercise and walk. Would like to see Orthopedics.  Lab Results  Component Value Date   HGBA1C 6.2 (H) 08/05/2016    Review of Systems  Constitutional: Negative for chills, fatigue and fever.  HENT: Positive for congestion, postnasal drip and sinus pressure.   Respiratory: Negative for chest tightness, shortness of breath and wheezing.   Cardiovascular: Positive for leg swelling. Negative for chest pain.  Gastrointestinal: Negative for abdominal pain.  Endocrine: Negative for polyuria.  Musculoskeletal: Positive for arthralgias (left knee) and back pain.  Skin: Negative for color change and rash.  Allergic/Immunologic: Positive for environmental  allergies.  Psychiatric/Behavioral: Negative for dysphoric mood and sleep disturbance. The patient is not nervous/anxious.     Patient Active Problem List   Diagnosis Date Noted  . Environmental and seasonal allergies 04/07/2017  . Tinea versicolor 02/03/2017  . Vasomotor symptoms due to menopause 11/11/2016  . Status post bilateral salpingo-oophorectomy (BSO) 10/05/2016  . Morbid obesity ([place]) 09/10/2016  . Status post laparoscopic supracervical hysterectomy 09/10/2016  . Right lower quadrant pain 09/10/2016  . B12 nutritional deficiency 08/05/2016  . Obstructive sleep apnea of adult 03/04/2016  . Bilateral lumbar radiculopathy 08/14/2015  . S/P spinal surgery 08/14/2015  . Status post gastric bypass for obesity 07/04/2015  . Prediabetes 07/01/2015  . Spinal stenosis, lumbar region, with neurogenic claudication 11/11/2012  . Clinical depression 04/04/2012  . Headache, migraine 04/04/2012    Prior to Admission medications   Medication Sig Start Date End Date Taking? Authorizing Provider  Cetirizine HCl 10 MG CAPS Take 1 capsule (10 mg total) by mouth daily. 01/16/16  Yes Triplett, Cari B, FNP  cyclobenzaprine (FLEXERIL) 10 MG tablet Take 1 tablet (10 mg total) by mouth 3 (three) times daily as needed. Patient taking differently: Take 10 mg by mouth 3 (three) times daily as needed for muscle spasms.  08/19/16  Yes Sable Feil, PA-C  DULoxetine (CYMBALTA) 30 MG capsule Take 30 mg by mouth 2 (two) times daily.  02/05/16  Yes [provider]  estradiol (ESTRACE) 1 MG tablet Take 1 tablet (1 mg total) by mouth daily. 11/11/16  Yes Defrancesco, Alanda Slim, MD  fluticasone (FLONASE) 50 MCG/ACT nasal spray Place 2 sprays into both nostrils daily. 04/22/16  Yes Glean Hess, MD  gabapentin (NEURONTIN) 300 MG capsule Take 600 mg by mouth at bedtime.  01/01/16  Yes [provider]  ketoconazole (NIZORAL) 2 % shampoo Apply 1 application topically 2 (two) times a week. 02/08/17   Yes Glean Hess, MD  latanoprost (XALATAN) 0.005 % ophthalmic solution Place 1 drop into both eyes at bedtime.    Yes [provider]  oxyCODONE-acetaminophen (ROXICET) 5-325 MG tablet Take 1-2 tablets by mouth every 4 (four) hours as needed for moderate pain or severe pain. 10/06/16  Yes Defrancesco, Alanda Slim, MD  rizatriptan (MAXALT-MLT) 10 MG disintegrating tablet TAKE 1 TABLET BY MOUTH AS NEEDED FOR MIGRAINE. MAY REPEAT IN 2 HOURS IF NEEDED. 04/19/16  Yes Glean Hess, MD  selenium sulfide (SELSUN) 1 % LOTN Apply 1 application topically daily. 02/03/17  Yes Glean Hess, MD    Allergies  Allergen Reactions  . Morphine And Related Hives, Shortness Of Breath, Nausea And Vomiting and Other (See Comments)    "can't breathe"  . Penicillins Anaphylaxis, Hives, Shortness Of Breath and Nausea And Vomiting    "can't breathe", Has patient had a PCN reaction causing immediate rash, facial/tongue/throat swelling, SOB or lightheadedness with hypotension: Yes Has patient had a PCN reaction causing severe rash involving mucus membranes or skin necrosis: No Has patient had a PCN reaction that required hospitalization No Has patient had a PCN reaction occurring within the last 10 years: No If all of the above answers are "NO", then may proceed with Cephalosporin use.   . Latex Hives  . Baclofen Nausea Only    Jittery, anxious    Past Surgical History:  Procedure Laterality Date  . ABDOMINAL HYSTERECTOMY  10/2006  . BACK SURGERY  2014   lumbar fusion  . BACK SURGERY  2009   discectomy  . BREAST REDUCTION SURGERY    . c sections    . GASTRIC BYPASS  2005  . LAPAROSCOPIC SALPINGO OOPHERECTOMY Bilateral 10/05/2016   Procedure: LAPAROSCOPIC SALPINGO OOPHORECTOMY;  Surgeon: Brayton Mars, MD;  Location: [facility] ORS;  Service: Gynecology;  Laterality: Bilateral;  . PELVIC LAPAROSCOPY    . REDUCTION MAMMAPLASTY Bilateral    2001  . SPINAL CORD STIMULATOR INSERTION N/A 03/20/2016    Procedure: LUMBAR SPINAL CORD STIMULATOR INSERTION;  Surgeon: Eustace Moore, MD;  Location: [place] [facility];  Service: Neurosurgery;  Laterality: N/A;  . TONSILLECTOMY      Social History  Substance Use Topics  . Smoking status: Never Smoker  . Smokeless tobacco: Never Used  . Alcohol use No     Medication list has been reviewed and updated.   Physical Exam  Constitutional: She is oriented to person, place, and time. She appears well-developed. No distress.  HENT:  Head: Normocephalic and atraumatic.  Neck: Normal range of motion.  Cardiovascular: Normal rate, regular rhythm and normal heart sounds.   Pulmonary/Chest: Effort normal and breath sounds normal. No respiratory distress. She has no wheezes.  Musculoskeletal: She exhibits edema (1+ at ankle).       Left knee: She exhibits decreased range of motion (exam difficult but no obvious effusion).  No calf tenderness or cord No redness or warmth  Neurological: She is alert and oriented to person, place, and time.  Skin: Skin is warm and dry. No rash noted.  Psychiatric: She has a normal mood and  affect. Her behavior is normal. Thought content normal. Her mood appears not anxious. She does not exhibit a depressed mood.  Nursing note and vitals reviewed.   BP 128/68   Pulse 92   Ht 5' 7.5" (1.715 m)   Wt (!) 343 lb (155.6 kg)   SpO2 97%   BMI 52.93 kg/m   Assessment and Plan: 1. Environmental and seasonal allergies Improved with zyrtec and flonase  2. B12 nutritional deficiency Pt will demonstrate good technique at next visit so she can administer at home - cyanocobalamin ((VITAMIN B-12)) injection 1,000 mcg; Inject 1 mL (1,000 mcg total) into the muscle once.   3. Prediabetes Check labs; reinforced diet to avoid weight gain - Hemoglobin J6B - Basic metabolic panel  4. Localized edema Likely mild venous insuff but will get labs Begin maxzide 1-2 times per week PRN - TSH - Basic metabolic panel -  triamterene-hydrochlorothiazide (MAXZIDE-25) 37.5-25 MG tablet; Take 1 tablet by mouth daily as needed.  Dispense: 30 tablet; Refill: 0  5. Chronic pain of left knee - Ambulatory referral to Orthopedic Surgery  6. Major depressive disorder with single episode, in partial remission ([place]) Doing much better on cymbalta  Meds ordered this encounter  Medications  . cyanocobalamin ((VITAMIN B-12)) injection 1,000 mcg  . Cetirizine HCl 10 MG CAPS    Sig: Take 1 capsule (10 mg total) by mouth daily.    Dispense:  30 capsule    Refill:  12  . triamterene-hydrochlorothiazide (MAXZIDE-25) 37.5-25 MG tablet    Sig: Take 1 tablet by mouth daily as needed.    Dispense:  30 tablet    Refill:  0  . cyanocobalamin ((VITAMIN B-12)) injection 1,000 mcg    Halina Maidens, MD [facility]  04/07/2017

## 2017-04-08 LAB — BASIC METABOLIC PANEL
BUN / CREAT RATIO: 13 (ref 9–23)
BUN: 8 mg/dL (ref 6–24)
CO2: 22 mmol/L (ref 20–29)
CREATININE: 0.64 mg/dL (ref 0.57–1.00)
Calcium: 9.3 mg/dL (ref 8.7–10.2)
Chloride: 103 mmol/L (ref 96–106)
GFR calc Af Amer: 121 mL/min/{1.73_m2} (ref >59)
GFR calc non Af Amer: 105 mL/min/{1.73_m2} (ref >59)
GLUCOSE: 82 mg/dL (ref 65–99)
Potassium: 4.2 mmol/L (ref 3.5–5.2)
SODIUM: 141 mmol/L (ref 134–144)

## 2017-04-08 LAB — TSH: TSH: 0.902 u[IU]/mL (ref 0.450–4.500)

## 2017-04-08 LAB — HEMOGLOBIN A1C
ESTIMATED AVERAGE GLUCOSE: 131 mg/dL
HEMOGLOBIN A1C: 6.2 % — AB (ref 4.8–5.6)

## 2017-04-14 DIAGNOSIS — Z6841 Body Mass Index (BMI) 40.0 and over, adult: Secondary | ICD-10-CM | POA: Diagnosis not present

## 2017-04-14 DIAGNOSIS — M222X2 Patellofemoral disorders, left knee: Secondary | ICD-10-CM | POA: Diagnosis not present

## 2017-04-14 DIAGNOSIS — M25562 Pain in left knee: Secondary | ICD-10-CM | POA: Diagnosis not present

## 2017-04-21 DIAGNOSIS — M222X2 Patellofemoral disorders, left knee: Secondary | ICD-10-CM | POA: Diagnosis not present

## 2017-04-23 DIAGNOSIS — M222X2 Patellofemoral disorders, left knee: Secondary | ICD-10-CM | POA: Diagnosis not present

## 2017-04-28 DIAGNOSIS — M222X2 Patellofemoral disorders, left knee: Secondary | ICD-10-CM | POA: Diagnosis not present

## 2017-05-01 ENCOUNTER — Other Ambulatory Visit: Payer: Self-pay | Admitting: Internal Medicine

## 2017-05-01 DIAGNOSIS — R600 Localized edema: Secondary | ICD-10-CM

## 2017-05-03 DIAGNOSIS — M222X2 Patellofemoral disorders, left knee: Secondary | ICD-10-CM | POA: Diagnosis not present

## 2017-05-05 DIAGNOSIS — M222X2 Patellofemoral disorders, left knee: Secondary | ICD-10-CM | POA: Diagnosis not present

## 2017-05-07 DIAGNOSIS — M25652 Stiffness of left hip, not elsewhere classified: Secondary | ICD-10-CM | POA: Diagnosis not present

## 2017-05-07 DIAGNOSIS — M222X2 Patellofemoral disorders, left knee: Secondary | ICD-10-CM | POA: Diagnosis not present

## 2017-05-12 ENCOUNTER — Ambulatory Visit (INDEPENDENT_AMBULATORY_CARE_PROVIDER_SITE_OTHER): Payer: Medicare HMO | Admitting: Internal Medicine

## 2017-05-12 DIAGNOSIS — E538 Deficiency of other specified B group vitamins: Secondary | ICD-10-CM

## 2017-05-12 MED ORDER — CYANOCOBALAMIN 1000 MCG/ML IJ SOLN
1000.0000 ug | Freq: Once | INTRAMUSCULAR | Status: AC
  Administered 2017-05-12: 1000 ug via INTRAMUSCULAR

## 2017-05-12 MED ORDER — CYANOCOBALAMIN 1000 MCG/ML IJ SOLN: 1000.0000 ug | INTRAMUSCULAR | 12 refills | Status: DC

## 2017-05-12 MED ORDER — "SYRINGE/NEEDLE (DISP) 25G X 1"" 3 ML MISC": 1.0000 | 0 refills | Status: DC

## 2017-05-12 NOTE — Progress Notes (Signed)
Date:  05/12/2017   Name:  Kimberly Cowan   DOB:  10/15/1965   MRN:  [id number]   Chief Complaint: B12 Injection (patient wants to self administer) Pt is getting B12 injections due to gastric bypass.  She would like to self administer and has done so in the past. She was able to draw up B12 appropriately and inject in her outer thigh with good technique.   Review of Systems  Constitutional: Negative for chills and fever.  Respiratory: Negative for shortness of breath.   Cardiovascular: Negative for chest pain.  Musculoskeletal: Positive for arthralgias (having arthroscopic knee surgery on left at end of month).    Patient Active Problem List   Diagnosis Date Noted  . Environmental and seasonal allergies 04/07/2017  . Knee pain, left 04/07/2017  . Major depressive disorder with single episode, in partial remission ([place]) 04/07/2017  . Tinea versicolor 02/03/2017  . Vasomotor symptoms due to menopause 11/11/2016  . Status post bilateral salpingo-oophorectomy (BSO) 10/05/2016  . Morbid obesity ([place]) 09/10/2016  . Status post laparoscopic supracervical hysterectomy 09/10/2016  . Right lower quadrant pain 09/10/2016  . B12 nutritional deficiency 08/05/2016  . Obstructive sleep apnea of adult 03/04/2016  . Bilateral lumbar radiculopathy 08/14/2015  . S/P spinal surgery 08/14/2015  . Status post gastric bypass for obesity 07/04/2015  . Prediabetes 07/01/2015  . Spinal stenosis, lumbar region, with neurogenic claudication 11/11/2012  . Headache, migraine 04/04/2012    Prior to Admission medications   Medication Sig Start Date End Date Taking? Authorizing Provider  Cetirizine HCl 10 MG CAPS Take 1 capsule (10 mg total) by mouth daily. 04/07/17   Glean Hess, MD  cyclobenzaprine (FLEXERIL) 10 MG tablet Take 1 tablet (10 mg total) by mouth 3 (three) times daily as needed. Patient taking differently: Take 10 mg by mouth 3 (three) times daily as needed for muscle  spasms.  08/19/16   Sable Feil, PA-C  DULoxetine (CYMBALTA) 30 MG capsule Take 30 mg by mouth 2 (two) times daily.  02/05/16   [provider]  estradiol (ESTRACE) 1 MG tablet Take 1 tablet (1 mg total) by mouth daily. 11/11/16   Defrancesco, Alanda Slim, MD  fluticasone (FLONASE) 50 MCG/ACT nasal spray Place 2 sprays into both nostrils daily. 04/22/16   Glean Hess, MD  gabapentin (NEURONTIN) 300 MG capsule Take 600 mg by mouth at bedtime.  01/01/16   [provider]  ketoconazole (NIZORAL) 2 % shampoo Apply 1 application topically 2 (two) times a week. 02/08/17   Glean Hess, MD  latanoprost (XALATAN) 0.005 % ophthalmic solution Place 1 drop into both eyes at bedtime.     [provider]  oxyCODONE-acetaminophen (ROXICET) 5-325 MG tablet Take 1-2 tablets by mouth every 4 (four) hours as needed for moderate pain or severe pain. 10/06/16   Defrancesco, Alanda Slim, MD  rizatriptan (MAXALT-MLT) 10 MG disintegrating tablet TAKE 1 TABLET BY MOUTH AS NEEDED FOR MIGRAINE. MAY REPEAT IN 2 HOURS IF NEEDED. 04/19/16   Glean Hess, MD  selenium sulfide (SELSUN) 1 % LOTN Apply 1 application topically daily. 02/03/17   Glean Hess, MD  triamterene-hydrochlorothiazide (MAXZIDE-25) 37.5-25 MG tablet TAKE 1 TABLET BY MOUTH DAILY AS NEEDED 05/02/17   Glean Hess, MD    Allergies  Allergen Reactions  . Morphine And Related Hives, Shortness Of Breath, Nausea And Vomiting and Other (See Comments)    "can't breathe"  . Penicillins Anaphylaxis, Hives, Shortness Of Breath  and Nausea And Vomiting    "can't breathe", Has patient had a PCN reaction causing immediate rash, facial/tongue/throat swelling, SOB or lightheadedness with hypotension: Yes Has patient had a PCN reaction causing severe rash involving mucus membranes or skin necrosis: No Has patient had a PCN reaction that required hospitalization No Has patient had a PCN reaction occurring within the last 10 years:  No If all of the above answers are "NO", then may proceed with Cephalosporin use.   . Latex Hives  . Baclofen Nausea Only    Jittery, anxious    Past Surgical History:  Procedure Laterality Date  . ABDOMINAL HYSTERECTOMY  10/2006  . BACK SURGERY  2014   lumbar fusion  . BACK SURGERY  2009   discectomy  . BREAST REDUCTION SURGERY    . c sections    . GASTRIC BYPASS  2005  . LAPAROSCOPIC SALPINGO OOPHERECTOMY Bilateral 10/05/2016   Procedure: LAPAROSCOPIC SALPINGO OOPHORECTOMY;  Surgeon: Brayton Mars, MD;  Location: [facility] ORS;  Service: Gynecology;  Laterality: Bilateral;  . PELVIC LAPAROSCOPY    . REDUCTION MAMMAPLASTY Bilateral    2001  . SPINAL CORD STIMULATOR INSERTION N/A 03/20/2016   Procedure: LUMBAR SPINAL CORD STIMULATOR INSERTION;  Surgeon: Eustace Moore, MD;  Location: [place] [facility];  Service: Neurosurgery;  Laterality: N/A;  . TONSILLECTOMY      Social History  Substance Use Topics  . Smoking status: Never Smoker  . Smokeless tobacco: Never Used  . Alcohol use No     Medication list has been reviewed and updated.   Physical Exam  Constitutional: She is oriented to person, place, and time. She appears well-developed. No distress.  HENT:  Head: Normocephalic and atraumatic.  Pulmonary/Chest: Effort normal. No respiratory distress.  Musculoskeletal: Normal range of motion.  Neurological: She is alert and oriented to person, place, and time.  Skin: Skin is warm and dry. No rash noted.  Psychiatric: She has a normal mood and affect. Her behavior is normal. Thought content normal.  Nursing note and vitals reviewed.   There were no vitals taken for this visit.  Assessment and Plan: 1. B12 nutritional deficiency - cyanocobalamin (,VITAMIN B-12,) 1000 MCG/ML injection; Inject 1 mL (1,000 mcg total) into the muscle every 30 (thirty) days.  Dispense: 1 mL; Refill: 12 - SYRINGE-NEEDLE, DISP, 3 ML (B-D 3CC LUER-LOK SYR 25GX1") 25G X 1" 3 ML MISC; 1 each by Does  not apply route every 30 (thirty) days.  Dispense: 50 each; Refill: 0   Meds ordered this encounter  Medications  . cyanocobalamin (,VITAMIN B-12,) 1000 MCG/ML injection    Sig: Inject 1 mL (1,000 mcg total) into the muscle every 30 (thirty) days.    Dispense:  1 mL    Refill:  12  . SYRINGE-NEEDLE, DISP, 3 ML (B-D 3CC LUER-LOK SYR 25GX1") 25G X 1" 3 ML MISC    Sig: 1 each by Does not apply route every 30 (thirty) days.    Dispense:  50 each    Refill:  0    Halina Maidens, MD [facility]  05/12/2017

## 2017-05-19 ENCOUNTER — Encounter
Admission: RE | Admit: 2017-05-19 | Discharge: 2017-05-19 | Disposition: A | Discharge status: Home / Self Care | Payer: Medicare HMO | Source: Ambulatory Visit | Attending: Orthopedic Surgery | Admitting: Orthopedic Surgery

## 2017-05-19 DIAGNOSIS — M5416 Radiculopathy, lumbar region: Secondary | ICD-10-CM | POA: Insufficient documentation

## 2017-05-19 DIAGNOSIS — R7303 Prediabetes: Secondary | ICD-10-CM | POA: Diagnosis not present

## 2017-05-19 DIAGNOSIS — G43909 Migraine, unspecified, not intractable, without status migrainosus: Secondary | ICD-10-CM | POA: Diagnosis not present

## 2017-05-19 DIAGNOSIS — F324 Major depressive disorder, single episode, in partial remission: Secondary | ICD-10-CM | POA: Diagnosis not present

## 2017-05-19 DIAGNOSIS — M48062 Spinal stenosis, lumbar region with neurogenic claudication: Secondary | ICD-10-CM | POA: Diagnosis not present

## 2017-05-19 DIAGNOSIS — G4733 Obstructive sleep apnea (adult) (pediatric): Secondary | ICD-10-CM | POA: Insufficient documentation

## 2017-05-19 DIAGNOSIS — Z01812 Encounter for preprocedural laboratory examination: Secondary | ICD-10-CM | POA: Diagnosis not present

## 2017-05-19 HISTORY — DX: Unspecified glaucoma: H40.9

## 2017-05-19 LAB — SURGICAL PCR SCREEN
MRSA, PCR: NEGATIVE
STAPHYLOCOCCUS AUREUS: NEGATIVE

## 2017-05-19 NOTE — Patient Instructions (Signed)
  Your procedure is scheduled on:05/27/17 Report to Day Surgery. [facility]LOOR To find out your arrival time please call [phone] between 1PM - 3PM on 05/26/17  Remember: Instructions that are not followed completely may result in serious medical risk, up to and including death, or upon the discretion of your surgeon and anesthesiologist your surgery may need to be rescheduled.    ___X_ 1. Do not eat food or drink liquids after midnight. No gum chewing or hard candies.     ____ 2. No Alcohol for 24 hours before or after surgery.   ____ 3. Do Not Smoke For 24 Hours Prior to Your Surgery.   ____ 4. Bring all medications with you on the day of surgery if instructed.    __X__ 5. Notify your doctor if there is any change in your medical condition     (cold, fever, infections).       Do not wear jewelry, make-up, hairpins, clips or nail polish.  Do not wear lotions, powders, or perfumes. You may wear deodorant.  Do not shave 48 hours prior to surgery. Men may shave face and neck.  Do not bring valuables to the hospital.    [facility] is not responsible for any belongings or valuables.               Contacts, dentures or bridgework may not be worn into surgery.  Leave your suitcase in the car. After surgery it may be brought to your room.  For patients admitted to the hospital, discharge time is determined by your                treatment team.   Patients discharged the day of surgery will not be allowed to drive home.   Please read over the following fact sheets that you were given:   Surgical Site Infection Prevention / MRSA  ____ Take these medicines the morning of surgery with A SIP OF WATER:    1. NONE  2.   3.   4.  5.  6.  ____ Fleet Enema (as directed)   __X__ Use CHG Soap as directed  ____ Use inhalers on the day of surgery  ____ Stop metformin 2 days prior to surgery    ____ Take 1/2 of usual insulin dose the night before surgery and none on the  morning of surgery.   ____ Stop Coumadin/Plavix/aspirin on  ____ Stop Anti-inflammatories on    ____ Stop supplements until after surgery.    __X__ Bring C-Pap to the hospital.   BRING SPINAL CORD STIMULATOR CONTROLLER AM OF SURGERY

## 2017-05-24 DIAGNOSIS — G4733 Obstructive sleep apnea (adult) (pediatric): Secondary | ICD-10-CM | POA: Diagnosis not present

## 2017-05-24 DIAGNOSIS — M4800 Spinal stenosis, site unspecified: Secondary | ICD-10-CM | POA: Diagnosis not present

## 2017-05-27 ENCOUNTER — Encounter
Admission: RE | Disposition: A | Discharge status: Home / Self Care | Payer: Self-pay | Source: Ambulatory Visit | Attending: Orthopedic Surgery

## 2017-05-27 ENCOUNTER — Encounter: Payer: Self-pay | Admitting: *Deleted

## 2017-05-27 ENCOUNTER — Ambulatory Visit: Payer: Medicare HMO | Admitting: Anesthesiology

## 2017-05-27 ENCOUNTER — Ambulatory Visit
Admission: RE | Admit: 2017-05-27 | Discharge: 2017-05-27 | Disposition: A | Discharge status: Home / Self Care | Payer: Medicare HMO | Source: Ambulatory Visit | Attending: Orthopedic Surgery | Admitting: Orthopedic Surgery

## 2017-05-27 DIAGNOSIS — Z79891 Long term (current) use of opiate analgesic: Secondary | ICD-10-CM | POA: Diagnosis not present

## 2017-05-27 DIAGNOSIS — S83282A Other tear of lateral meniscus, current injury, left knee, initial encounter: Secondary | ICD-10-CM | POA: Diagnosis not present

## 2017-05-27 DIAGNOSIS — Z79899 Other long term (current) drug therapy: Secondary | ICD-10-CM | POA: Insufficient documentation

## 2017-05-27 DIAGNOSIS — S83002A Unspecified subluxation of left patella, initial encounter: Secondary | ICD-10-CM | POA: Diagnosis not present

## 2017-05-27 DIAGNOSIS — M2212 Recurrent subluxation of patella, left knee: Secondary | ICD-10-CM | POA: Diagnosis not present

## 2017-05-27 DIAGNOSIS — F329 Major depressive disorder, single episode, unspecified: Secondary | ICD-10-CM | POA: Diagnosis not present

## 2017-05-27 DIAGNOSIS — X58XXXA Exposure to other specified factors, initial encounter: Secondary | ICD-10-CM | POA: Diagnosis not present

## 2017-05-27 DIAGNOSIS — M222X2 Patellofemoral disorders, left knee: Secondary | ICD-10-CM | POA: Diagnosis not present

## 2017-05-27 DIAGNOSIS — Z6841 Body Mass Index (BMI) 40.0 and over, adult: Secondary | ICD-10-CM | POA: Diagnosis not present

## 2017-05-27 DIAGNOSIS — Y9289 Other specified places as the place of occurrence of the external cause: Secondary | ICD-10-CM | POA: Diagnosis not present

## 2017-05-27 DIAGNOSIS — E669 Obesity, unspecified: Secondary | ICD-10-CM | POA: Diagnosis not present

## 2017-05-27 DIAGNOSIS — M1990 Unspecified osteoarthritis, unspecified site: Secondary | ICD-10-CM | POA: Diagnosis not present

## 2017-05-27 DIAGNOSIS — M23252 Derangement of posterior horn of lateral meniscus due to old tear or injury, left knee: Secondary | ICD-10-CM | POA: Diagnosis not present

## 2017-05-27 DIAGNOSIS — F419 Anxiety disorder, unspecified: Secondary | ICD-10-CM | POA: Diagnosis not present

## 2017-05-27 HISTORY — PX: KNEE ARTHROSCOPY WITH LATERAL MENISECTOMY: SHX6193

## 2017-05-27 HISTORY — PX: KNEE ARTHROSCOPY WITH LATERAL RELEASE: SHX5649

## 2017-05-27 SURGERY — ARTHROSCOPY, KNEE, WITH LATERAL MENISCECTOMY
Anesthesia: General | Site: Knee | Laterality: Left | Wound class: Clean

## 2017-05-27 MED ORDER — MIDAZOLAM HCL 2 MG/2ML IJ SOLN
INTRAMUSCULAR | Status: DC | PRN
  Administered 2017-05-27: 2 mg via INTRAVENOUS

## 2017-05-27 MED ORDER — FENTANYL CITRATE (PF) 100 MCG/2ML IJ SOLN
INTRAMUSCULAR | Status: AC
  Administered 2017-05-27: 50 ug via INTRAVENOUS
  Filled 2017-05-27: qty 2

## 2017-05-27 MED ORDER — LABETALOL HCL 5 MG/ML IV SOLN
INTRAVENOUS | Status: DC | PRN
  Administered 2017-05-27 (×2): 2.5 mg via INTRAVENOUS

## 2017-05-27 MED ORDER — FAMOTIDINE 20 MG PO TABS
ORAL_TABLET | ORAL | Status: AC
  Administered 2017-05-27: 20 mg via ORAL
  Filled 2017-05-27: qty 1

## 2017-05-27 MED ORDER — GLYCOPYRROLATE 0.2 MG/ML IJ SOLN
INTRAMUSCULAR | Status: DC | PRN
  Administered 2017-05-27: 0.2 mg via INTRAVENOUS

## 2017-05-27 MED ORDER — OXYCODONE HCL 5 MG/5ML PO SOLN: 5.0000 mg | Freq: Once | ORAL | Status: AC | PRN

## 2017-05-27 MED ORDER — OXYCODONE-ACETAMINOPHEN 5-325 MG PO TABS: 1.0000 | ORAL_TABLET | Freq: Four times a day (QID) | ORAL | 0 refills | Status: DC | PRN

## 2017-05-27 MED ORDER — CLINDAMYCIN PHOSPHATE 900 MG/50ML IV SOLN
INTRAVENOUS | Status: AC
  Filled 2017-05-27: qty 50

## 2017-05-27 MED ORDER — MEPERIDINE HCL 50 MG/ML IJ SOLN: 6.2500 mg | INTRAMUSCULAR | Status: DC | PRN

## 2017-05-27 MED ORDER — PROPOFOL 10 MG/ML IV BOLUS
INTRAVENOUS | Status: DC | PRN
  Administered 2017-05-27: 200 mg via INTRAVENOUS
  Administered 2017-05-27: 30 mg via INTRAVENOUS

## 2017-05-27 MED ORDER — LACTATED RINGERS IV SOLN: INTRAVENOUS | Status: DC

## 2017-05-27 MED ORDER — FENTANYL CITRATE (PF) 100 MCG/2ML IJ SOLN
INTRAMUSCULAR | Status: AC
  Filled 2017-05-27: qty 2

## 2017-05-27 MED ORDER — MIDAZOLAM HCL 2 MG/2ML IJ SOLN
INTRAMUSCULAR | Status: AC
  Filled 2017-05-27: qty 2

## 2017-05-27 MED ORDER — PROPOFOL 10 MG/ML IV BOLUS
INTRAVENOUS | Status: AC
  Filled 2017-05-27: qty 20

## 2017-05-27 MED ORDER — LACTATED RINGERS IV SOLN
INTRAVENOUS | Status: DC
  Administered 2017-05-27: 1000 mL via INTRAVENOUS
  Administered 2017-05-27: 15:00:00 via INTRAVENOUS

## 2017-05-27 MED ORDER — FAMOTIDINE 20 MG PO TABS
20.0000 mg | ORAL_TABLET | Freq: Once | ORAL | Status: AC
  Administered 2017-05-27: 20 mg via ORAL

## 2017-05-27 MED ORDER — DEXAMETHASONE SODIUM PHOSPHATE 10 MG/ML IJ SOLN
INTRAMUSCULAR | Status: DC | PRN
  Administered 2017-05-27: 5 mg via INTRAVENOUS

## 2017-05-27 MED ORDER — SUCCINYLCHOLINE CHLORIDE 20 MG/ML IJ SOLN
INTRAMUSCULAR | Status: DC | PRN
  Administered 2017-05-27: 140 mg via INTRAVENOUS

## 2017-05-27 MED ORDER — BUPIVACAINE-EPINEPHRINE (PF) 0.5% -1:200000 IJ SOLN
INTRAMUSCULAR | Status: DC | PRN
Start: 2017-05-27 — End: 2017-05-27
  Administered 2017-05-27: 30 mL

## 2017-05-27 MED ORDER — CLINDAMYCIN PHOSPHATE 900 MG/50ML IV SOLN
900.0000 mg | Freq: Once | INTRAVENOUS | Status: AC
  Administered 2017-05-27: 900 mg via INTRAVENOUS

## 2017-05-27 MED ORDER — FAMOTIDINE 20 MG PO TABS: 20.0000 mg | ORAL_TABLET | Freq: Once | ORAL | Status: DC

## 2017-05-27 MED ORDER — FENTANYL CITRATE (PF) 100 MCG/2ML IJ SOLN
25.0000 ug | INTRAMUSCULAR | Status: AC | PRN
  Administered 2017-05-27 (×6): 50 ug via INTRAVENOUS

## 2017-05-27 MED ORDER — OXYCODONE HCL 5 MG PO TABS
5.0000 mg | ORAL_TABLET | Freq: Once | ORAL | Status: AC | PRN
  Administered 2017-05-27: 5 mg via ORAL

## 2017-05-27 MED ORDER — FENTANYL CITRATE (PF) 100 MCG/2ML IJ SOLN
INTRAMUSCULAR | Status: DC | PRN
  Administered 2017-05-27 (×3): 50 ug via INTRAVENOUS

## 2017-05-27 MED ORDER — PROMETHAZINE HCL 25 MG/ML IJ SOLN: 6.2500 mg | INTRAMUSCULAR | Status: DC | PRN

## 2017-05-27 MED ORDER — OXYCODONE HCL 5 MG PO TABS
ORAL_TABLET | ORAL | Status: AC
  Administered 2017-05-27: 5 mg via ORAL
  Filled 2017-05-27: qty 1

## 2017-05-27 MED ORDER — ONDANSETRON HCL 4 MG/2ML IJ SOLN
INTRAMUSCULAR | Status: DC | PRN
  Administered 2017-05-27: 4 mg via INTRAVENOUS

## 2017-05-27 SURGICAL SUPPLY — 24 items
BANDAGE ACE 4X5 VEL STRL LF (GAUZE/BANDAGES/DRESSINGS) ×2 IMPLANT
BLADE INCISOR PLUS 4.5 (BLADE) IMPLANT
CHLORAPREP W/TINT 26ML (MISCELLANEOUS) ×3 IMPLANT
CUFF TOURN 24 STER (MISCELLANEOUS) IMPLANT
CUFF TOURN 30 STER DUAL PORT (MISCELLANEOUS) ×2 IMPLANT
GAUZE SPONGE 4X4 12PLY STRL (GAUZE/BANDAGES/DRESSINGS) ×3 IMPLANT
GLOVE SURG SYN 9.0  PF PI (GLOVE) ×1
GLOVE SURG SYN 9.0 PF PI (GLOVE) ×2 IMPLANT
GOWN SRG 2XL LVL 4 RGLN SLV (GOWNS) ×2 IMPLANT
GOWN STRL NON-REIN 2XL LVL4 (GOWNS) ×3
GOWN STRL REUS W/ TWL LRG LVL3 (GOWN DISPOSABLE) ×4 IMPLANT
GOWN STRL REUS W/TWL LRG LVL3 (GOWN DISPOSABLE) ×6
IV LACTATED RINGER IRRG 3000ML (IV SOLUTION) ×6
IV LR IRRIG 3000ML ARTHROMATIC (IV SOLUTION) ×4 IMPLANT
KIT RM TURNOVER STRD PROC AR (KITS) ×3 IMPLANT
MANIFOLD NEPTUNE II (INSTRUMENTS) ×3 IMPLANT
PACK ARTHROSCOPY KNEE (MISCELLANEOUS) ×3 IMPLANT
SET TUBE SUCT SHAVER OUTFL 24K (TUBING) ×3 IMPLANT
SET TUBE TIP INTRA-ARTICULAR (MISCELLANEOUS) ×3 IMPLANT
SUT ETHILON 4-0 (SUTURE) ×3
SUT ETHILON 4-0 FS2 18XMFL BLK (SUTURE) ×2
SUTURE ETHLN 4-0 FS2 18XMF BLK (SUTURE) ×2 IMPLANT
TUBING ARTHRO INFLOW-ONLY STRL (TUBING) ×3 IMPLANT
WAND COBLATION FLOW 50 (SURGICAL WAND) ×3 IMPLANT

## 2017-05-27 NOTE — Anesthesia Procedure Notes (Signed)
Procedure Name: Intubation Date/Time: 05/27/2017 3:37 PM Performed by: Dionne Bucy Pre-anesthesia Checklist: Patient identified, Patient being monitored, Timeout performed, Emergency Drugs available and Suction available Patient Re-evaluated:Patient Re-evaluated prior to induction Oxygen Delivery Method: Circle system utilized Preoxygenation: Pre-oxygenation with 100% oxygen Induction Type: IV induction Ventilation: Mask ventilation without difficulty Laryngoscope Size: 3 and McGraph Grade View: Grade I Tube type: Oral Tube size: 7.0 mm Number of attempts: 1 Airway Equipment and Method: Stylet Placement Confirmation: ETT inserted through vocal cords under direct vision,  positive ETCO2 and breath sounds checked- equal and bilateral Secured at: 21 cm Tube secured with: Tape Dental Injury: Teeth and Oropharynx as per pre-operative assessment

## 2017-05-27 NOTE — Op Note (Signed)
05/27/2017  4:42 PM  PATIENT:  Kimberly Cowan  51 y.o. female  PRE-OPERATIVE DIAGNOSIS:  PATELLA FEMORAL PAIN SYNDROME OF LEFT KNEE LATERAL RELEASE PATELLA SUBLUXATION  POST-OPERATIVE DIAGNOSIS:  PATELLA FEMORAL PAIN SYNDROME OF LEFT KNEE with lateral meniscus tear  LATERAL RELEASE PATELLA SUBLUXATION  PROCEDURE:  Procedure(s): KNEE ARTHROSCOPY WITH LATERAL MENISECTOMY (Right) KNEE ARTHROSCOPY WITH LATERAL RELEASE (Right)  SURGEON: Laurene Footman, MD  ASSISTANTS: none  ANESTHESIA:   general  EBL:  Total I/O In: 600 [I.V.:600] Out: 10 [Blood:10]  BLOOD ADMINISTERED:none  DRAINS: none   LOCAL MEDICATIONS USED:  MARCAINE     SPECIMEN:  No Specimen  DISPOSITION OF SPECIMEN:  N/A  COUNTS:  YES  TOURNIQUET:   Total Tourniquet Time Documented: Thigh (Left) - 12 minutes Total: Thigh (Left) - 12 minutes   IMPLANTS: None  DICTATION: .Dragon Dictation patient was brought to the operative room and after adequate general anesthesia was obtained tourniquet about the upper thigh and the leg was prepped and draped in a sterile fashion. After patient identification timeout procedures were completed, inferior lateral portal was made and the scope was introduced. Initial inspection revealed lateral subluxation of patella with a tight lateral retinaculum with mild chondromalacia in the central aspect of the patella as well as the lateral aspect of the femoral trochlea, medial compartment and medial compartment. Normal and inferomedial portal was made and probing the medial meniscus was intact as was the articular cartilage. The anterior cruciate ligament was intact to probing. In the lateral compartment there is some very superficial cartilage loss in the central aspect of the tibial condyle and on probing there is a small tear of the posterior horn of the meniscus near the notch with about a 5 mm flap that impinged into the joint this was ablated with use of an ArthroCare  wand the gutters were free of any loose bodies at this point interscapular release is carried out until the patella could be placed in midline position without difficulty which was not possible to start of the case because the tight lateral retinaculum following this all instrumentation was withdrawn after thoroughly irrigating the knee the portals and irregular release were infiltrated with several 30 cc 0% Sensorcaine with epinephrine followed by closure of the skin incisions with simple interrupted 4-0 nylon. Xeroform 4 x 4's web roll and Ace wrap applied patient to recovery stable condition   PLAN OF CARE: Discharge to home after PACU  PATIENT DISPOSITION:  PACU - hemodynamically stable.

## 2017-05-27 NOTE — Anesthesia Preprocedure Evaluation (Signed)
Anesthesia Evaluation  Patient identified by MRN, date of birth, ID band Patient awake    Reviewed: Allergy & Precautions, NPO status , Patient's Chart, lab work & pertinent test results  History of Anesthesia Complications Negative for: history of anesthetic complications  Airway Mallampati: II  TM Distance: >3 FB Neck ROM: Full    Dental no notable dental hx.    Pulmonary sleep apnea and Continuous Positive Airway Pressure Ventilation , neg COPD,    breath sounds clear to auscultation- rhonchi (-) wheezing      Cardiovascular (-) hypertension(-) CAD, (-) Past MI and (-) Cardiac Stents  Rhythm:Regular Rate:Normal - Systolic murmurs and - Diastolic murmurs    Neuro/Psych  Headaches, PSYCHIATRIC DISORDERS Anxiety Depression    GI/Hepatic negative GI ROS, Neg liver ROS,   Endo/Other  negative endocrine ROSneg diabetes  Renal/GU negative Renal ROS     Musculoskeletal  (+) Arthritis ,   Abdominal (+) + obese,   Peds  Hematology negative hematology ROS (+)   Anesthesia Other Findings Past Medical History: No date: Anxiety No date: Arthritis     Comment:  lower spine No date: Chronic back pain 04/04/2012: Clinical depression No date: Depression No date: Glaucoma     Comment:  RIGHT EYE No date: Headache(784.0)     Comment:  migraines No date: Ovarian cyst No date: Pneumonia     Comment:  walking pneumonia No date: Sleep apnea     Comment:  no cpcap     last sleep study > 4 yrs   Reproductive/Obstetrics                             Anesthesia Physical Anesthesia Plan  ASA: III  Anesthesia Plan: General   Post-op Pain Management:    Induction: Intravenous  PONV Risk Score and Plan: 2 and Ondansetron and Dexamethasone  Airway Management Planned: Oral ETT  Additional Equipment:   Intra-op Plan:   Post-operative Plan: Extubation in OR  Informed Consent: I have reviewed the  patients History and Physical, chart, labs and discussed the procedure including the risks, benefits and alternatives for the proposed anesthesia with the patient or authorized representative who has indicated his/her understanding and acceptance.   Dental advisory given  Plan Discussed with: CRNA and Anesthesiologist  Anesthesia Plan Comments:         Anesthesia Quick Evaluation

## 2017-05-27 NOTE — Anesthesia Post-op Follow-up Note (Signed)
Anesthesia QCDR form completed.        

## 2017-05-27 NOTE — Discharge Instructions (Addendum)
AMBULATORY SURGERY  DISCHARGE INSTRUCTIONS   1) The drugs that you were given will stay in your system until tomorrow so for the next 24 hours you should not:  A) Drive an automobile B) Make any legal decisions C) Drink any alcoholic beverage   2) You may resume regular meals tomorrow.  Today it is better to start with liquids and gradually work up to solid foods.  You may eat anything you prefer, but it is better to start with liquids, then soup and crackers, and gradually work up to solid foods.   3) Please notify your doctor immediately if you have any unusual bleeding, trouble breathing, redness and pain at the surgery site, drainage, fever, or pain not relieved by medication.    4) Additional Instructions:        Please contact your physician with any problems or Same Day Surgery at [phone], Monday through Friday 6 am to 4 pm, or [place] at [facility] number at [phone].Keep dressing clean and dry. Weightbearing as tolerated on left leg. Minimize activities throughout the weekend. Aspirin 325 mg daily, pain medicine as directed

## 2017-05-27 NOTE — Anesthesia Postprocedure Evaluation (Signed)
Anesthesia Post Note  Patient: Aariel Evette Haith-Kelly  Procedure(s) Performed: Procedure(s) (LRB): KNEE ARTHROSCOPY WITH LATERAL MENISECTOMY (Right) KNEE ARTHROSCOPY WITH LATERAL RELEASE (Right)  Patient location during evaluation: PACU Anesthesia Type: General Level of consciousness: awake and alert and oriented Pain management: pain level controlled Vital Signs Assessment: post-procedure vital signs reviewed and stable Respiratory status: spontaneous breathing, nonlabored ventilation and respiratory function stable Cardiovascular status: blood pressure returned to baseline and stable Postop Assessment: no signs of nausea or vomiting Anesthetic complications: no     Last Vitals:  Vitals:   05/27/17 1702 05/27/17 1717  BP: 121/89 134/90  Pulse: 74 86  Resp: 19 12  Temp:  36.6 C  SpO2: 100% 98%    Last Pain:  Vitals:   05/27/17 1717  TempSrc:   PainSc: 4                  Cynitha Berte

## 2017-05-27 NOTE — H&P (Signed)
Reviewed paper H+P, will be scanned into chart. No changes noted.  

## 2017-05-27 NOTE — Transfer of Care (Signed)
Immediate Anesthesia Transfer of Care Note  Patient: Kimberly Cowan  Procedure(s) Performed: Procedure(s): KNEE ARTHROSCOPY WITH LATERAL MENISECTOMY (Right) KNEE ARTHROSCOPY WITH LATERAL RELEASE (Right)  Patient Location: PACU  Anesthesia Type:General  Level of Consciousness: awake and patient cooperative  Airway & Oxygen Therapy: Patient Spontanous Breathing and Patient connected to face mask oxygen  Post-op Assessment: Report given to RN and Post -op Vital signs reviewed and stable  Post vital signs: Reviewed and stable  Last Vitals:  Vitals:   05/27/17 1452 05/27/17 1632  BP: 126/88 (!) (P) 142/98  Pulse: 88 78  Resp: 20 20  Temp: 36.6 C (!) (P) 36.1 C  SpO2: 94% 100%    Last Pain:  Vitals:   05/27/17 1452  TempSrc: Oral  PainSc: 6       Patients Stated Pain Goal: 2 (03/49/17 9150)  Complications: No apparent anesthesia complications

## 2017-05-29 DIAGNOSIS — I2699 Other pulmonary embolism without acute cor pulmonale: Secondary | ICD-10-CM

## 2017-05-29 HISTORY — DX: Other pulmonary embolism without acute cor pulmonale: I26.99

## 2017-06-01 DIAGNOSIS — M961 Postlaminectomy syndrome, not elsewhere classified: Secondary | ICD-10-CM | POA: Diagnosis not present

## 2017-06-01 DIAGNOSIS — M48061 Spinal stenosis, lumbar region without neurogenic claudication: Secondary | ICD-10-CM | POA: Diagnosis not present

## 2017-06-01 DIAGNOSIS — F1120 Opioid dependence, uncomplicated: Secondary | ICD-10-CM | POA: Diagnosis not present

## 2017-06-01 DIAGNOSIS — F419 Anxiety disorder, unspecified: Secondary | ICD-10-CM | POA: Diagnosis not present

## 2017-06-09 ENCOUNTER — Inpatient Hospital Stay
Admission: EM | Admit: 2017-06-09 | Discharge: 2017-06-12 | DRG: 559 | Disposition: A | Discharge status: Home / Self Care | Payer: Medicare HMO | Attending: Internal Medicine | Admitting: Internal Medicine

## 2017-06-09 ENCOUNTER — Encounter: Payer: Self-pay | Admitting: Emergency Medicine

## 2017-06-09 ENCOUNTER — Emergency Department: Payer: Medicare HMO

## 2017-06-09 DIAGNOSIS — I2609 Other pulmonary embolism with acute cor pulmonale: Secondary | ICD-10-CM | POA: Diagnosis not present

## 2017-06-09 DIAGNOSIS — Y792 Prosthetic and other implants, materials and accessory orthopedic devices associated with adverse incidents: Secondary | ICD-10-CM | POA: Diagnosis present

## 2017-06-09 DIAGNOSIS — G4730 Sleep apnea, unspecified: Secondary | ICD-10-CM | POA: Diagnosis not present

## 2017-06-09 DIAGNOSIS — M549 Dorsalgia, unspecified: Secondary | ICD-10-CM | POA: Diagnosis present

## 2017-06-09 DIAGNOSIS — I361 Nonrheumatic tricuspid (valve) insufficiency: Secondary | ICD-10-CM | POA: Diagnosis not present

## 2017-06-09 DIAGNOSIS — T8489XA Other specified complication of internal orthopedic prosthetic devices, implants and grafts, initial encounter: Principal | ICD-10-CM | POA: Diagnosis present

## 2017-06-09 DIAGNOSIS — R778 Other specified abnormalities of plasma proteins: Secondary | ICD-10-CM

## 2017-06-09 DIAGNOSIS — G629 Polyneuropathy, unspecified: Secondary | ICD-10-CM | POA: Diagnosis not present

## 2017-06-09 DIAGNOSIS — R000 Tachycardia, unspecified: Secondary | ICD-10-CM | POA: Diagnosis present

## 2017-06-09 DIAGNOSIS — Y834 Other reconstructive surgery as the cause of abnormal reaction of the patient, or of later complication, without mention of misadventure at the time of the procedure: Secondary | ICD-10-CM | POA: Diagnosis present

## 2017-06-09 DIAGNOSIS — H409 Unspecified glaucoma: Secondary | ICD-10-CM | POA: Diagnosis present

## 2017-06-09 DIAGNOSIS — Z880 Allergy status to penicillin: Secondary | ICD-10-CM

## 2017-06-09 DIAGNOSIS — R748 Abnormal levels of other serum enzymes: Secondary | ICD-10-CM | POA: Diagnosis not present

## 2017-06-09 DIAGNOSIS — M9689 Other intraoperative and postprocedural complications and disorders of the musculoskeletal system: Secondary | ICD-10-CM | POA: Diagnosis present

## 2017-06-09 DIAGNOSIS — Z91040 Latex allergy status: Secondary | ICD-10-CM | POA: Diagnosis not present

## 2017-06-09 DIAGNOSIS — J9601 Acute respiratory failure with hypoxia: Secondary | ICD-10-CM | POA: Diagnosis not present

## 2017-06-09 DIAGNOSIS — Z6841 Body Mass Index (BMI) 40.0 and over, adult: Secondary | ICD-10-CM

## 2017-06-09 DIAGNOSIS — I82402 Acute embolism and thrombosis of unspecified deep veins of left lower extremity: Secondary | ICD-10-CM | POA: Diagnosis not present

## 2017-06-09 DIAGNOSIS — Z888 Allergy status to other drugs, medicaments and biological substances status: Secondary | ICD-10-CM

## 2017-06-09 DIAGNOSIS — Z969 Presence of functional implant, unspecified: Secondary | ICD-10-CM | POA: Diagnosis present

## 2017-06-09 DIAGNOSIS — Z885 Allergy status to narcotic agent status: Secondary | ICD-10-CM

## 2017-06-09 DIAGNOSIS — F419 Anxiety disorder, unspecified: Secondary | ICD-10-CM | POA: Diagnosis present

## 2017-06-09 DIAGNOSIS — Z981 Arthrodesis status: Secondary | ICD-10-CM

## 2017-06-09 DIAGNOSIS — M4690 Unspecified inflammatory spondylopathy, site unspecified: Secondary | ICD-10-CM | POA: Diagnosis present

## 2017-06-09 DIAGNOSIS — R7989 Other specified abnormal findings of blood chemistry: Secondary | ICD-10-CM | POA: Diagnosis present

## 2017-06-09 DIAGNOSIS — I82409 Acute embolism and thrombosis of unspecified deep veins of unspecified lower extremity: Secondary | ICD-10-CM | POA: Diagnosis not present

## 2017-06-09 DIAGNOSIS — I2699 Other pulmonary embolism without acute cor pulmonale: Secondary | ICD-10-CM | POA: Diagnosis not present

## 2017-06-09 DIAGNOSIS — Z86711 Personal history of pulmonary embolism: Secondary | ICD-10-CM | POA: Diagnosis present

## 2017-06-09 DIAGNOSIS — F329 Major depressive disorder, single episode, unspecified: Secondary | ICD-10-CM | POA: Diagnosis present

## 2017-06-09 DIAGNOSIS — R0600 Dyspnea, unspecified: Secondary | ICD-10-CM | POA: Diagnosis not present

## 2017-06-09 DIAGNOSIS — Z9884 Bariatric surgery status: Secondary | ICD-10-CM

## 2017-06-09 DIAGNOSIS — I10 Essential (primary) hypertension: Secondary | ICD-10-CM | POA: Diagnosis present

## 2017-06-09 DIAGNOSIS — E876 Hypokalemia: Secondary | ICD-10-CM | POA: Diagnosis present

## 2017-06-09 DIAGNOSIS — Z79899 Other long term (current) drug therapy: Secondary | ICD-10-CM

## 2017-06-09 DIAGNOSIS — I82401 Acute embolism and thrombosis of unspecified deep veins of right lower extremity: Secondary | ICD-10-CM | POA: Diagnosis not present

## 2017-06-09 DIAGNOSIS — R609 Edema, unspecified: Secondary | ICD-10-CM

## 2017-06-09 DIAGNOSIS — G8929 Other chronic pain: Secondary | ICD-10-CM | POA: Diagnosis present

## 2017-06-09 DIAGNOSIS — Z79890 Hormone replacement therapy (postmenopausal): Secondary | ICD-10-CM

## 2017-06-09 DIAGNOSIS — Z7982 Long term (current) use of aspirin: Secondary | ICD-10-CM

## 2017-06-09 DIAGNOSIS — R0602 Shortness of breath: Secondary | ICD-10-CM

## 2017-06-09 DIAGNOSIS — R0902 Hypoxemia: Secondary | ICD-10-CM

## 2017-06-09 LAB — COMPREHENSIVE METABOLIC PANEL
ALT: 14 U/L (ref 14–54)
ANION GAP: 13 (ref 5–15)
AST: 25 U/L (ref 15–41)
Albumin: 3.5 g/dL (ref 3.5–5.0)
Alkaline Phosphatase: 82 U/L (ref 38–126)
BUN: 10 mg/dL (ref 6–20)
CHLORIDE: 102 mmol/L (ref 101–111)
CO2: 24 mmol/L (ref 22–32)
Calcium: 9.8 mg/dL (ref 8.9–10.3)
Creatinine, Ser: 0.91 mg/dL (ref 0.44–1.00)
Glucose, Bld: 155 mg/dL — ABNORMAL HIGH (ref 65–99)
POTASSIUM: 3.3 mmol/L — AB (ref 3.5–5.1)
SODIUM: 139 mmol/L (ref 135–145)
Total Bilirubin: 0.6 mg/dL (ref 0.3–1.2)
Total Protein: 8.2 g/dL — ABNORMAL HIGH (ref 6.5–8.1)

## 2017-06-09 LAB — CBC WITH DIFFERENTIAL/PLATELET
BASOS ABS: 0.1 10*3/uL (ref 0–0.1)
BASOS PCT: 1 %
EOS ABS: 0.1 10*3/uL (ref 0–0.7)
Eosinophils Relative: 1 %
HEMATOCRIT: 42.2 % (ref 35.0–47.0)
Hemoglobin: 14.1 g/dL (ref 12.0–16.0)
Lymphocytes Relative: 33 %
Lymphs Abs: 2.5 10*3/uL (ref 1.0–3.6)
MCH: 27.4 pg (ref 26.0–34.0)
MCHC: 33.5 g/dL (ref 32.0–36.0)
MCV: 81.8 fL (ref 80.0–100.0)
MONO ABS: 0.6 10*3/uL (ref 0.2–0.9)
MONOS PCT: 8 %
NEUTROS ABS: 4.3 10*3/uL (ref 1.4–6.5)
NEUTROS PCT: 57 %
Platelets: 305 10*3/uL (ref 150–440)
RBC: 5.15 MIL/uL (ref 3.80–5.20)
RDW: 15.2 % — AB (ref 11.5–14.5)
WBC: 7.5 10*3/uL (ref 3.6–11.0)

## 2017-06-09 LAB — PROTIME-INR
INR: 1.01
PROTHROMBIN TIME: 13.2 s (ref 11.4–15.2)

## 2017-06-09 LAB — TROPONIN I: TROPONIN I: 0.19 ng/mL — AB (ref <0.03)

## 2017-06-09 NOTE — ED Provider Notes (Signed)
[facility] Emergency Department Provider Note   ____________________________________________   First MD Initiated Contact with Patient 06/09/17 2348     (approximate)  I have reviewed the triage vital signs and the nursing notes.   HISTORY  Chief Complaint Shortness of Breath    HPI Kimberly Cowan is a 51 y.o. female who presents to the ED from home via EMS with chief complaint of shortness of breath. Reports onset of shortness of breath yesterday morning and progressive during the course of the day. Patient had left knee surgery 05/27/17. Currently on baby aspirin daily. EMS reports room air saturations 88%; 94% on 2L O2. EMS also reports patient's initial heart rate 140s. Patient denies recent fever, chills, chest pain, abdominal pain, nausea, vomiting. Oxygen makes her symptoms better; exertion makes her symptoms worse.   Past Medical History:  Diagnosis Date  . Anxiety   . Arthritis    lower spine  . Chronic back pain   . Clinical depression 04/04/2012  . Depression   . Glaucoma    RIGHT EYE  . Headache(784.0)    migraines  . Ovarian cyst   . Pneumonia    walking pneumonia  . Sleep apnea    no cpcap     last sleep study > 4 yrs    Patient Active Problem List   Diagnosis Date Noted  . Pulmonary embolism ([place]) 06/10/2017  . Environmental and seasonal allergies 04/07/2017  . Knee pain, left 04/07/2017  . Major depressive disorder with single episode, in partial remission ([place]) 04/07/2017  . Tinea versicolor 02/03/2017  . Vasomotor symptoms due to menopause 11/11/2016  . Status post bilateral salpingo-oophorectomy (BSO) 10/05/2016  . Morbid obesity ([place]) 09/10/2016  . Status post laparoscopic supracervical hysterectomy 09/10/2016  . Right lower quadrant pain 09/10/2016  . B12 nutritional deficiency 08/05/2016  . Obstructive sleep apnea of adult 03/04/2016  . Bilateral lumbar radiculopathy 08/14/2015  . S/P spinal surgery  08/14/2015  . Status post gastric bypass for obesity 07/04/2015  . Prediabetes 07/01/2015  . Spinal stenosis, lumbar region, with neurogenic claudication 11/11/2012  . Headache, migraine 04/04/2012    Past Surgical History:  Procedure Laterality Date  . ABDOMINAL HYSTERECTOMY  10/2006  . BACK SURGERY  2014   lumbar fusion  . BACK SURGERY  2009   discectomy  . BREAST REDUCTION SURGERY    . c sections    . GASTRIC BYPASS  2005  . KNEE ARTHROSCOPY WITH LATERAL MENISECTOMY Right 05/27/2017   Procedure: KNEE ARTHROSCOPY WITH LATERAL MENISECTOMY;  Surgeon: Hessie Knows, MD;  Location: [facility] ORS;  Service: Orthopedics;  Laterality: Right;  . KNEE ARTHROSCOPY WITH LATERAL RELEASE Right 05/27/2017   Procedure: KNEE ARTHROSCOPY WITH LATERAL RELEASE;  Surgeon: Hessie Knows, MD;  Location: [facility] ORS;  Service: Orthopedics;  Laterality: Right;  . LAPAROSCOPIC SALPINGO OOPHERECTOMY Bilateral 10/05/2016   Procedure: LAPAROSCOPIC SALPINGO OOPHORECTOMY;  Surgeon: Brayton Mars, MD;  Location: [facility] ORS;  Service: Gynecology;  Laterality: Bilateral;  . PELVIC LAPAROSCOPY    . REDUCTION MAMMAPLASTY Bilateral    2001  . SPINAL CORD STIMULATOR INSERTION N/A 03/20/2016   Procedure: LUMBAR SPINAL CORD STIMULATOR INSERTION;  Surgeon: Eustace Moore, MD;  Location: [place] [facility];  Service: Neurosurgery;  Laterality: N/A;  . TONSILLECTOMY      Prior to Admission medications   Medication Sig Start Date End Date Taking? Authorizing Provider  Cetirizine HCl 10 MG CAPS Take 1 capsule (10 mg total) by mouth daily. Patient taking  differently: Take 10 mg by mouth at bedtime.  04/07/17  Yes Glean Hess, MD  cyanocobalamin (,VITAMIN B-12,) 1000 MCG/ML injection Inject 1 mL (1,000 mcg total) into the muscle every 30 (thirty) days. 05/12/17  Yes Glean Hess, MD  DULoxetine (CYMBALTA) 30 MG capsule Take 30 mg by mouth at bedtime.  02/05/16  Yes [provider]  estradiol (ESTRACE) 1 MG tablet Take 1  tablet (1 mg total) by mouth daily. Patient taking differently: Take 1 mg by mouth at bedtime.  11/11/16  Yes Defrancesco, Alanda Slim, MD  fluticasone (FLONASE) 50 MCG/ACT nasal spray Place 2 sprays into both nostrils daily. Patient taking differently: Place 2 sprays into both nostrils daily as needed for allergies.  04/22/16  Yes Glean Hess, MD  gabapentin (NEURONTIN) 300 MG capsule Take 600 mg by mouth at bedtime.  01/01/16  Yes [provider]  ketoconazole (NIZORAL) 2 % shampoo Apply 1 application topically 2 (two) times a week. Patient taking differently: Apply 1 application topically daily as needed (for skin fungus on neck/back).  02/08/17  Yes Glean Hess, MD  latanoprost (XALATAN) 0.005 % ophthalmic solution Place 1 drop into both eyes at bedtime.    Yes [provider]  oxyCODONE-acetaminophen (ROXICET) 5-325 MG tablet Take 1-2 tablets by mouth every 6 (six) hours as needed. 05/27/17  Yes Hessie Knows, MD  rizatriptan (MAXALT-MLT) 10 MG disintegrating tablet TAKE 1 TABLET BY MOUTH AS NEEDED FOR MIGRAINE. MAY REPEAT IN 2 HOURS IF NEEDED. 04/19/16  Yes Glean Hess, MD  triamterene-hydrochlorothiazide (MAXZIDE-25) 37.5-25 MG tablet TAKE 1 TABLET BY MOUTH DAILY AS NEEDED Patient taking differently: Take 1 tablet by mouth daily as needed (for fluid retention/swelling in right leg or ankle).  05/02/17  Yes Glean Hess, MD  SYRINGE-NEEDLE, DISP, 3 ML (B-D 3CC LUER-LOK SYR 25GX1") 25G X 1" 3 ML MISC 1 each by Does not apply route every 30 (thirty) days. 05/12/17   Glean Hess, MD    Allergies Morphine and related; Penicillins; Latex; and Baclofen  Family History  Problem Relation Age of Onset  . Diabetes Mother   . Hypertension Mother   . Diabetes Sister   . Hypertension Father   . Hypertension Sister   . Breast cancer Neg Hx     Social History Social History  Substance Use Topics  . Smoking status: Never Smoker  . Smokeless tobacco: Never Used    . Alcohol use No    Review of Systems  Constitutional: No fever/chills. Eyes: No visual changes. ENT: No sore throat. Cardiovascular: Denies chest pain. Respiratory: positive for shortness of breath. Gastrointestinal: No abdominal pain.  No nausea, no vomiting.  No diarrhea.  No constipation. Genitourinary: Negative for dysuria. Musculoskeletal: Negative for back pain. Skin: Negative for rash. Neurological: Negative for headaches, focal weakness or numbness.   ____________________________________________   PHYSICAL EXAM:  VITAL SIGNS: ED Triage Vitals  Enc Vitals Group     BP --      Pulse --      Resp --      Temp 06/09/17 2321 (!) 97.2 F (36.2 C)     Temp Source 06/09/17 2321 Oral     SpO2 06/09/17 2320 94 %     Weight 06/09/17 2321 (!) 337 lb (152.9 kg)     Height 06/09/17 2321 5' 7.5" (1.715 m)     Head Circumference --      Peak Flow --      Pain Score --  Pain Loc --      Pain Edu? --      Excl. in [place]? --     Constitutional: Alert and oriented. Well appearing and in moderate acute distress. Eyes: Conjunctivae are normal. PERRL. EOMI. Head: Atraumatic. Nose: No congestion/rhinnorhea. Mouth/Throat: Mucous membranes are moist.  Oropharynx non-erythematous. Neck: No stridor.   Cardiovascular: tachycardic rate, regular rhythm. Grossly normal heart sounds.  Good peripheral circulation. Respiratory: increased respiratory effort.  No retractions. Lungs with bibasilar rales. Gastrointestinal: Soft and nontender. No distention. No abdominal bruits. No CVA tenderness. Musculoskeletal: Left postop site clean/dry/intact. No lower extremity tenderness nor edema.  No joint effusions. Neurologic:  Normal speech and language. No gross focal neurologic deficits are appreciated.  Skin:  Skin is warm, dry and intact. No rash noted. Psychiatric: Mood and affect are normal. Speech and behavior are normal.  ____________________________________________   LABS (all labs  ordered are listed, but only abnormal results are displayed)  Labs Reviewed  CBC WITH DIFFERENTIAL/PLATELET - Abnormal; Notable for the following:       Result Value   RDW 15.2 (*)    All other components within normal limits  COMPREHENSIVE METABOLIC PANEL - Abnormal; Notable for the following:    Potassium 3.3 (*)    Glucose, Bld 155 (*)    Total Protein 8.2 (*)    All other components within normal limits  TROPONIN I - Abnormal; Notable for the following:    Troponin I 0.19 (*)    All other components within normal limits  BRAIN NATRIURETIC PEPTIDE - Abnormal; Notable for the following:    B Natriuretic Peptide 243.0 (*)    All other components within normal limits  TROPONIN I - Abnormal; Notable for the following:    Troponin I 0.19 (*)    All other components within normal limits  PROTIME-INR  APTT  TROPONIN I  BASIC METABOLIC PANEL  CBC  HEPARIN LEVEL (UNFRACTIONATED)   ____________________________________________  EKG  ED ECG REPORT I, Ziyan Hillmer J, the attending physician, personally viewed and interpreted this ECG.   Date: 06/09/2017  EKG Time: 2330  Rate: 125  Rhythm: sinus tachycardia  Axis: normal  Intervals:none  ST&T Change: nonspecific  ____________________________________________  RADIOLOGY  Ct Angio Chest Pe W/cm &/or Wo Cm  Result Date: 06/10/2017 CLINICAL DATA:  51 y/o  F; shortness of breath and hypoxia. EXAM: CT ANGIOGRAPHY CHEST WITH CONTRAST TECHNIQUE: Multidetector CT imaging of the chest was performed using the standard protocol during bolus administration of intravenous contrast. Multiplanar CT image reconstructions and MIPs were obtained to evaluate the vascular anatomy. CONTRAST:  100 cc Isovue 370 COMPARISON:  None. FINDINGS: Cardiovascular: Bilateral acute lobar pulmonary embolus. No saddle embolus. RV/LV ratio equals 1.5. Normal heart size. No pericardial effusion. Normal caliber thoracic aorta. Mediastinum/Nodes: No enlarged mediastinal,  hilar, or axillary lymph nodes. Thyroid gland, trachea, and esophagus demonstrate no significant findings. Lungs/Pleura: Minor atelectasis in the lower lobes bilaterally. No consolidation. Upper Abdomen: No acute abnormality. Musculoskeletal: No chest wall abnormality. No acute or significant osseous findings. Review of the MIP images confirms the above findings. IMPRESSION: Positive for acute PE with CTevidence of right heart strain (RV/LV Ratio = 1.5) consistent with at least submassive (intermediate risk) PE. The presence of right heart strain has been associated with an increased risk of morbidity and mortality. These results were called by telephone at the time of interpretation on 06/10/2017 at 12:51 am to Dr. Lurline Hare , who verbally acknowledged these results. Electronically Signed  By: Kristine Garbe M.D.   On: 06/10/2017 00:52   Dg Chest Port 1 View  Result Date: 06/10/2017 CLINICAL DATA:  Dyspnea since yesterday morning. EXAM: PORTABLE CHEST 1 VIEW COMPARISON:  04/21/2016 FINDINGS: The heart size and mediastinal contours are within normal limits. Eventration of the diaphragms. Neural stimulator lead projects over the lower thoracic spine as before. Both lungs are clear. The visualized skeletal structures are unremarkable. IMPRESSION: No active disease. Electronically Signed   By: Ashley Royalty M.D.   On: 06/10/2017 00:00    ____________________________________________   PROCEDURES  Procedure(s) performed: None  Procedures  Critical Care performed: Yes, see critical care note(s)   CRITICAL CARE Performed by: Paulette Blanch   Total critical care time: 45 minutes  Critical care time was exclusive of separately billable procedures and treating other patients.  Critical care was necessary to treat or prevent imminent or life-threatening deterioration.  Critical care was time spent personally by me on the following activities: development of treatment plan with patient and/or  surrogate as well as nursing, discussions with consultants, evaluation of patient's response to treatment, examination of patient, obtaining history from patient or surrogate, ordering and performing treatments and interventions, ordering and review of laboratory studies, ordering and review of radiographic studies, pulse oximetry and re-evaluation of patient's condition.  ____________________________________________   INITIAL IMPRESSION / ASSESSMENT AND PLAN / ED COURSE  Pertinent labs & imaging results that were available during my care of the patient were reviewed by me and considered in my medical decision making (see chart for details).  51 year old female who is 2 weeks status post left knee surgery with shortness of breath. No prior history of lung disease. Will obtain screening lab work, chest x-ray. Consider CT chest to evaluate for pulmonary embolism.  Clinical Course as of Jun 11 623  Thu Jun 10, 2017  0012 Noted elevated troponin in the setting of normal chest x-ray. High index of suspicion for pulmonary embolism; will initiate heparin bolus and infusion pending CT scan.  [JS]  [id number] Spoke with radiologist Dr. Toney Reil regarding CT results. Discussed with hospitalist who will evaluate patient in the emergency for admission.  [JS]    Clinical Course User Index [JS] Paulette Blanch, MD     ____________________________________________   FINAL CLINICAL IMPRESSION(S) / ED DIAGNOSES  Final diagnoses:  Shortness of breath  Hypoxia  Elevated troponin  Other acute pulmonary embolism with acute cor pulmonale ([place])      NEW MEDICATIONS STARTED DURING THIS VISIT:  Current Discharge Medication List       Note:  This document was prepared using Dragon voice recognition software and may include unintentional dictation errors.    Paulette Blanch, MD 06/10/17 [phone]

## 2017-06-09 NOTE — ED Triage Notes (Signed)
Patient comes in from home via [facility] with [facility] that started yesterday morning. Patient had knee surgery on 05/27/17. Patient states [facility] is worse with exertion. Per EMS when they arrived patient was 88-90% on room air, put her on 2L went to 94%. Patients initial heart rate was 140's. Patient denies any chest pain.

## 2017-06-10 ENCOUNTER — Encounter: Payer: Self-pay | Admitting: Radiology

## 2017-06-10 ENCOUNTER — Emergency Department: Payer: Medicare HMO

## 2017-06-10 ENCOUNTER — Inpatient Hospital Stay: Payer: Medicare HMO

## 2017-06-10 ENCOUNTER — Inpatient Hospital Stay (HOSPITAL_COMMUNITY)
Admit: 2017-06-10 | Discharge: 2017-06-10 | Disposition: A | Discharge status: Home / Self Care | Payer: Medicare HMO | Attending: Internal Medicine | Admitting: Internal Medicine

## 2017-06-10 DIAGNOSIS — Z885 Allergy status to narcotic agent status: Secondary | ICD-10-CM | POA: Diagnosis not present

## 2017-06-10 DIAGNOSIS — I10 Essential (primary) hypertension: Secondary | ICD-10-CM | POA: Diagnosis present

## 2017-06-10 DIAGNOSIS — F419 Anxiety disorder, unspecified: Secondary | ICD-10-CM | POA: Diagnosis present

## 2017-06-10 DIAGNOSIS — J9601 Acute respiratory failure with hypoxia: Secondary | ICD-10-CM | POA: Diagnosis present

## 2017-06-10 DIAGNOSIS — Z981 Arthrodesis status: Secondary | ICD-10-CM | POA: Diagnosis not present

## 2017-06-10 DIAGNOSIS — I2609 Other pulmonary embolism with acute cor pulmonale: Secondary | ICD-10-CM | POA: Diagnosis present

## 2017-06-10 DIAGNOSIS — E876 Hypokalemia: Secondary | ICD-10-CM | POA: Diagnosis present

## 2017-06-10 DIAGNOSIS — Z880 Allergy status to penicillin: Secondary | ICD-10-CM | POA: Diagnosis not present

## 2017-06-10 DIAGNOSIS — T8489XA Other specified complication of internal orthopedic prosthetic devices, implants and grafts, initial encounter: Secondary | ICD-10-CM | POA: Diagnosis present

## 2017-06-10 DIAGNOSIS — I361 Nonrheumatic tricuspid (valve) insufficiency: Secondary | ICD-10-CM

## 2017-06-10 DIAGNOSIS — F329 Major depressive disorder, single episode, unspecified: Secondary | ICD-10-CM | POA: Diagnosis present

## 2017-06-10 DIAGNOSIS — G8929 Other chronic pain: Secondary | ICD-10-CM | POA: Diagnosis present

## 2017-06-10 DIAGNOSIS — M4690 Unspecified inflammatory spondylopathy, site unspecified: Secondary | ICD-10-CM | POA: Diagnosis present

## 2017-06-10 DIAGNOSIS — R7989 Other specified abnormal findings of blood chemistry: Secondary | ICD-10-CM | POA: Diagnosis present

## 2017-06-10 DIAGNOSIS — I82402 Acute embolism and thrombosis of unspecified deep veins of left lower extremity: Secondary | ICD-10-CM | POA: Diagnosis present

## 2017-06-10 DIAGNOSIS — Z969 Presence of functional implant, unspecified: Secondary | ICD-10-CM | POA: Diagnosis present

## 2017-06-10 DIAGNOSIS — Y792 Prosthetic and other implants, materials and accessory orthopedic devices associated with adverse incidents: Secondary | ICD-10-CM | POA: Diagnosis present

## 2017-06-10 DIAGNOSIS — Y834 Other reconstructive surgery as the cause of abnormal reaction of the patient, or of later complication, without mention of misadventure at the time of the procedure: Secondary | ICD-10-CM | POA: Diagnosis present

## 2017-06-10 DIAGNOSIS — G4730 Sleep apnea, unspecified: Secondary | ICD-10-CM | POA: Diagnosis present

## 2017-06-10 DIAGNOSIS — H409 Unspecified glaucoma: Secondary | ICD-10-CM | POA: Diagnosis present

## 2017-06-10 DIAGNOSIS — R000 Tachycardia, unspecified: Secondary | ICD-10-CM | POA: Diagnosis present

## 2017-06-10 DIAGNOSIS — G629 Polyneuropathy, unspecified: Secondary | ICD-10-CM | POA: Diagnosis present

## 2017-06-10 DIAGNOSIS — Z86711 Personal history of pulmonary embolism: Secondary | ICD-10-CM | POA: Diagnosis present

## 2017-06-10 DIAGNOSIS — M9689 Other intraoperative and postprocedural complications and disorders of the musculoskeletal system: Secondary | ICD-10-CM | POA: Diagnosis present

## 2017-06-10 DIAGNOSIS — I2699 Other pulmonary embolism without acute cor pulmonale: Secondary | ICD-10-CM | POA: Diagnosis not present

## 2017-06-10 DIAGNOSIS — Z91040 Latex allergy status: Secondary | ICD-10-CM | POA: Diagnosis not present

## 2017-06-10 DIAGNOSIS — M549 Dorsalgia, unspecified: Secondary | ICD-10-CM | POA: Diagnosis present

## 2017-06-10 DIAGNOSIS — Z6841 Body Mass Index (BMI) 40.0 and over, adult: Secondary | ICD-10-CM | POA: Diagnosis not present

## 2017-06-10 DIAGNOSIS — R0602 Shortness of breath: Secondary | ICD-10-CM | POA: Diagnosis present

## 2017-06-10 LAB — CBC
HEMATOCRIT: 40.5 % (ref 35.0–47.0)
HEMOGLOBIN: 13.3 g/dL (ref 12.0–16.0)
MCH: 26.8 pg (ref 26.0–34.0)
MCHC: 32.9 g/dL (ref 32.0–36.0)
MCV: 81.4 fL (ref 80.0–100.0)
Platelets: 282 10*3/uL (ref 150–440)
RBC: 4.98 MIL/uL (ref 3.80–5.20)
RDW: 15.9 % — ABNORMAL HIGH (ref 11.5–14.5)
WBC: 7.3 10*3/uL (ref 3.6–11.0)

## 2017-06-10 LAB — BASIC METABOLIC PANEL
ANION GAP: 11 (ref 5–15)
BUN: 11 mg/dL (ref 6–20)
CHLORIDE: 101 mmol/L (ref 101–111)
CO2: 25 mmol/L (ref 22–32)
Calcium: 9.4 mg/dL (ref 8.9–10.3)
Creatinine, Ser: 0.93 mg/dL (ref 0.44–1.00)
GFR calc Af Amer: >60 mL/min (ref >60)
Glucose, Bld: 140 mg/dL — ABNORMAL HIGH (ref 65–99)
POTASSIUM: 3.2 mmol/L — AB (ref 3.5–5.1)
SODIUM: 137 mmol/L (ref 135–145)

## 2017-06-10 LAB — ECHOCARDIOGRAM COMPLETE
Height: 67 in
Weight: 5276.8 oz

## 2017-06-10 LAB — HEPARIN LEVEL (UNFRACTIONATED)
HEPARIN UNFRACTIONATED: 0.66 [IU]/mL (ref 0.30–0.70)
HEPARIN UNFRACTIONATED: 0.2 [IU]/mL — AB (ref 0.30–0.70)
HEPARIN UNFRACTIONATED: 0.43 [IU]/mL (ref 0.30–0.70)

## 2017-06-10 LAB — APTT: aPTT: 30 seconds (ref 24–36)

## 2017-06-10 LAB — TROPONIN I
TROPONIN I: 0.14 ng/mL — AB (ref <0.03)
Troponin I: 0.19 ng/mL (ref <0.03)

## 2017-06-10 LAB — BRAIN NATRIURETIC PEPTIDE: B NATRIURETIC PEPTIDE 5: 243 pg/mL — AB (ref 0.0–100.0)

## 2017-06-10 MED ORDER — ONDANSETRON HCL 4 MG/2ML IJ SOLN: 4.0000 mg | Freq: Four times a day (QID) | INTRAMUSCULAR | Status: DC | PRN

## 2017-06-10 MED ORDER — FLUTICASONE PROPIONATE 50 MCG/ACT NA SUSP
2.0000 | Freq: Every day | NASAL | Status: DC
  Administered 2017-06-10 – 2017-06-12 (×3): 2 via NASAL
  Filled 2017-06-10: qty 16

## 2017-06-10 MED ORDER — HEPARIN SODIUM (PORCINE) 5000 UNIT/ML IJ SOLN: 4000.0000 [IU] | Freq: Once | INTRAMUSCULAR | Status: DC

## 2017-06-10 MED ORDER — ALUM & MAG HYDROXIDE-SIMETH 200-200-20 MG/5ML PO SUSP
30.0000 mL | Freq: Four times a day (QID) | ORAL | Status: DC | PRN
  Administered 2017-06-10: 30 mL via ORAL
  Filled 2017-06-10: qty 30

## 2017-06-10 MED ORDER — HEPARIN (PORCINE) IN NACL 100-0.45 UNIT/ML-% IJ SOLN
1200.0000 [IU]/h | Freq: Once | INTRAMUSCULAR | Status: AC
  Administered 2017-06-10: 1200 [IU]/h via INTRAVENOUS
  Filled 2017-06-10: qty 250

## 2017-06-10 MED ORDER — GABAPENTIN 300 MG PO CAPS
600.0000 mg | ORAL_CAPSULE | Freq: Every day | ORAL | Status: DC
  Administered 2017-06-10 – 2017-06-11 (×2): 600 mg via ORAL
  Filled 2017-06-10 (×2): qty 2

## 2017-06-10 MED ORDER — SODIUM CHLORIDE 0.9 % IV SOLN: 250.0000 mL | INTRAVENOUS | Status: DC | PRN

## 2017-06-10 MED ORDER — SENNOSIDES-DOCUSATE SODIUM 8.6-50 MG PO TABS: 1.0000 | ORAL_TABLET | Freq: Every evening | ORAL | Status: DC | PRN

## 2017-06-10 MED ORDER — HEPARIN BOLUS VIA INFUSION
1500.0000 [IU] | Freq: Once | INTRAVENOUS | Status: AC
  Administered 2017-06-10: 1500 [IU] via INTRAVENOUS
  Filled 2017-06-10: qty 1500

## 2017-06-10 MED ORDER — HEPARIN (PORCINE) IN NACL 100-0.45 UNIT/ML-% IJ SOLN
1600.0000 [IU]/h | INTRAMUSCULAR | Status: DC
  Administered 2017-06-10: 1200 [IU]/h via INTRAVENOUS
  Administered 2017-06-11 (×2): 1600 [IU]/h via INTRAVENOUS
  Filled 2017-06-10 (×4): qty 250

## 2017-06-10 MED ORDER — DULOXETINE HCL 30 MG PO CPEP
30.0000 mg | ORAL_CAPSULE | Freq: Every day | ORAL | Status: DC
  Administered 2017-06-10 – 2017-06-11 (×2): 30 mg via ORAL
  Filled 2017-06-10 (×2): qty 1

## 2017-06-10 MED ORDER — SODIUM CHLORIDE 0.9% FLUSH
3.0000 mL | Freq: Two times a day (BID) | INTRAVENOUS | Status: DC
  Administered 2017-06-10 – 2017-06-12 (×4): 3 mL via INTRAVENOUS

## 2017-06-10 MED ORDER — KETOROLAC TROMETHAMINE 30 MG/ML IJ SOLN
30.0000 mg | Freq: Four times a day (QID) | INTRAMUSCULAR | Status: DC | PRN
  Administered 2017-06-10 – 2017-06-11 (×3): 30 mg via INTRAVENOUS
  Filled 2017-06-10 (×3): qty 1

## 2017-06-10 MED ORDER — POTASSIUM CHLORIDE CRYS ER 20 MEQ PO TBCR
20.0000 meq | EXTENDED_RELEASE_TABLET | Freq: Once | ORAL | Status: AC
  Administered 2017-06-10: 20 meq via ORAL
  Filled 2017-06-10: qty 1

## 2017-06-10 MED ORDER — OXYCODONE HCL 5 MG PO TABS
5.0000 mg | ORAL_TABLET | ORAL | Status: DC | PRN
  Administered 2017-06-10 – 2017-06-12 (×6): 5 mg via ORAL
  Filled 2017-06-10 (×6): qty 1

## 2017-06-10 MED ORDER — IOPAMIDOL (ISOVUE-370) INJECTION 76%
100.0000 mL | Freq: Once | INTRAVENOUS | Status: AC | PRN
  Administered 2017-06-10: 100 mL via INTRAVENOUS

## 2017-06-10 MED ORDER — ONDANSETRON HCL 4 MG PO TABS
4.0000 mg | ORAL_TABLET | Freq: Four times a day (QID) | ORAL | Status: DC | PRN
  Administered 2017-06-10: 4 mg via ORAL
  Filled 2017-06-10: qty 1

## 2017-06-10 MED ORDER — SODIUM CHLORIDE 0.9% FLUSH: 3.0000 mL | INTRAVENOUS | Status: DC | PRN

## 2017-06-10 MED ORDER — ACETAMINOPHEN 325 MG PO TABS
650.0000 mg | ORAL_TABLET | Freq: Four times a day (QID) | ORAL | Status: DC | PRN
  Administered 2017-06-12: 650 mg via ORAL
  Filled 2017-06-10: qty 2

## 2017-06-10 MED ORDER — HEPARIN SODIUM (PORCINE) 5000 UNIT/ML IJ SOLN
60.0000 [IU]/kg | Freq: Once | INTRAMUSCULAR | Status: AC
  Administered 2017-06-10: 9150 [IU] via INTRAVENOUS

## 2017-06-10 MED ORDER — LATANOPROST 0.005 % OP SOLN
1.0000 [drp] | Freq: Every day | OPHTHALMIC | Status: DC
  Administered 2017-06-10 – 2017-06-11 (×2): 1 [drp] via OPHTHALMIC
  Filled 2017-06-10: qty 2.5

## 2017-06-10 MED ORDER — POTASSIUM CHLORIDE CRYS ER 20 MEQ PO TBCR
40.0000 meq | EXTENDED_RELEASE_TABLET | Freq: Two times a day (BID) | ORAL | Status: AC
Start: 2017-06-10 — End: 2017-06-10
  Administered 2017-06-10 (×2): 40 meq via ORAL
  Filled 2017-06-10: qty 2

## 2017-06-10 MED ORDER — ACETAMINOPHEN 650 MG RE SUPP: 650.0000 mg | Freq: Four times a day (QID) | RECTAL | Status: DC | PRN

## 2017-06-10 NOTE — Progress Notes (Signed)
ANTICOAGULATION CONSULT NOTE - Initial Consult  Pharmacy Consult for heparin Indication: pulmonary embolus  Allergies  Allergen Reactions  . Morphine And Related Hives, Shortness Of Breath, Nausea And Vomiting and Other (See Comments)    "can't breathe"  . Penicillins Anaphylaxis, Hives, Shortness Of Breath and Nausea And Vomiting    "can't breathe", Has patient had a PCN reaction causing immediate rash, facial/tongue/throat swelling, SOB or lightheadedness with hypotension: Yes Has patient had a PCN reaction causing severe rash involving mucus membranes or skin necrosis: No Has patient had a PCN reaction that required hospitalization No Has patient had a PCN reaction occurring within the last 10 years: No If all of the above answers are "NO", then may proceed with Cephalosporin use.   . Latex Hives  . Baclofen Nausea Only    Jittery, anxious    Patient Measurements: Height: 5\' 7"  (170.2 cm) Weight: (!) 329 lb 12.8 oz (149.6 kg) IBW/kg (Calculated) : 61.6 Heparin Dosing Weight: 98.8kg  Vital Signs: Temp: 97.9 F (36.6 C) (09/13 1937) Temp Source: Oral (09/13 1937) BP: 122/39 (09/13 1937) Pulse Rate: 116 (09/13 1937)  Labs:  Recent Labs  06/09/17 2327 06/10/17 0242 06/10/17 0604 06/10/17 0812 06/10/17 1216 06/10/17 1908  HGB 14.1  --   --  13.3  --   --   HCT 42.2  --   --  40.5  --   --   PLT 305  --   --  282  --   --   APTT 30  --   --   --   --   --   LABPROT 13.2  --   --   --   --   --   INR 1.01  --   --   --   --   --   HEPARINUNFRC  --   --  0.66  --  0.20* 0.43  CREATININE 0.91  --   --  0.93  --   --   TROPONINI 0.19* 0.19*  --  0.14*  --   --     Estimated Creatinine Clearance: 111.8 mL/min (by C-G formula based on SCr of 0.93 mg/dL).   Medical History: Past Medical History:  Diagnosis Date  . Anxiety   . Arthritis    lower spine  . Chronic back pain   . Clinical depression 04/04/2012  . Depression   . Glaucoma    RIGHT EYE  .  Headache(784.0)    migraines  . Ovarian cyst   . Pneumonia    walking pneumonia  . Sleep apnea    no cpcap     last sleep study > 4 yrs    Medications:  Scheduled:  . DULoxetine  30 mg Oral QHS  . fluticasone  2 spray Each Nare Daily  . gabapentin  600 mg Oral QHS  . latanoprost  1 drop Both Eyes QHS  . potassium chloride  40 mEq Oral BID  . sodium chloride flush  3 mL Intravenous Q12H    Assessment: Patient admitted x SOB and found to have acute PE on CT. Patient is being started on heparin drip for acute PE.  Goal of Therapy:  Heparin level 0.3-0.7 units/ml Monitor platelets by anticoagulation protocol: Yes   Plan:  Heparin level low at 0.20. Due to indication (PE w/ right heart strain) I will bolus 1500 units and increase rate to 1500 units/hr. Desiring a heparin level closer to 0.7 than 0.3 Heparin level in 6 hours  9/13 1908 HL 0.43. Will continue current heparin rate of 1500 units/hr and recheck heparin level in 6 hours.    Pernell Dupre, PharmD, BCPS Clinical Pharmacist 06/10/2017 7:50 PM

## 2017-06-10 NOTE — Plan of Care (Signed)
Problem: Safety: Goal: Ability to remain free from injury will improve Outcome: Progressing  Patient's VSS throughout the shift. Patient complaints of pain, PRN Oxycodone and Toradol given. Heparin drip at 15 ml/hr. RN will continue to monitor.

## 2017-06-10 NOTE — Progress Notes (Signed)
ANTICOAGULATION CONSULT NOTE - Initial Consult  Pharmacy Consult for heparin Indication: pulmonary embolus  Allergies  Allergen Reactions  . Morphine And Related Hives, Shortness Of Breath, Nausea And Vomiting and Other (See Comments)    "can't breathe"  . Penicillins Anaphylaxis, Hives, Shortness Of Breath and Nausea And Vomiting    "can't breathe", Has patient had a PCN reaction causing immediate rash, facial/tongue/throat swelling, SOB or lightheadedness with hypotension: Yes Has patient had a PCN reaction causing severe rash involving mucus membranes or skin necrosis: No Has patient had a PCN reaction that required hospitalization No Has patient had a PCN reaction occurring within the last 10 years: No If all of the above answers are "NO", then may proceed with Cephalosporin use.   . Latex Hives  . Baclofen Nausea Only    Jittery, anxious    Patient Measurements: Height: 5\' 7"  (170.2 cm) Weight: (!) 329 lb 12.8 oz (149.6 kg) IBW/kg (Calculated) : 61.6 Heparin Dosing Weight: 98.8kg  Vital Signs: Temp: 98 F (36.7 C) (09/13 0541) Temp Source: Oral (09/13 0541) BP: 118/64 (09/13 0725) Pulse Rate: 110 (09/13 0725)  Labs:  Recent Labs  06/09/17 2327 06/10/17 0242 06/10/17 0604 06/10/17 0812 06/10/17 1216  HGB 14.1  --   --  13.3  --   HCT 42.2  --   --  40.5  --   PLT 305  --   --  282  --   APTT 30  --   --   --   --   LABPROT 13.2  --   --   --   --   INR 1.01  --   --   --   --   HEPARINUNFRC  --   --  0.66  --  0.20*  CREATININE 0.91  --   --  0.93  --   TROPONINI 0.19* 0.19*  --  0.14*  --     Estimated Creatinine Clearance: 111.8 mL/min (by C-G formula based on SCr of 0.93 mg/dL).   Medical History: Past Medical History:  Diagnosis Date  . Anxiety   . Arthritis    lower spine  . Chronic back pain   . Clinical depression 04/04/2012  . Depression   . Glaucoma    RIGHT EYE  . Headache(784.0)    migraines  . Ovarian cyst   . Pneumonia    walking  pneumonia  . Sleep apnea    no cpcap     last sleep study > 4 yrs    Medications:  Scheduled:  . DULoxetine  30 mg Oral QHS  . fluticasone  2 spray Each Nare Daily  . gabapentin  600 mg Oral QHS  . heparin  1,500 Units Intravenous Once  . latanoprost  1 drop Both Eyes QHS  . sodium chloride flush  3 mL Intravenous Q12H    Assessment: Patient admitted x SOB and found to have acute PE on CT. Patient is being started on heparin drip for acute PE.  Goal of Therapy:  Heparin level 0.3-0.7 units/ml Monitor platelets by anticoagulation protocol: Yes   Plan:  Heparin level low at 0.20. Due to indication (PE w/ right heart strain) I will bolus 1500 units and increase rate to 1500 units/hr. Desiring a heparin level closer to 0.7 than 0.3 Heparin level in 6 hours  Alexiz Cothran D Tuere Nwosu, Pharm.D, BCPS Clinical Pharmacist  06/10/2017

## 2017-06-10 NOTE — Progress Notes (Addendum)
ANTICOAGULATION CONSULT NOTE - Initial Consult  Pharmacy Consult for heparin Indication: pulmonary embolus  Allergies  Allergen Reactions  . Morphine And Related Hives, Shortness Of Breath, Nausea And Vomiting and Other (See Comments)    "can't breathe"  . Penicillins Anaphylaxis, Hives, Shortness Of Breath and Nausea And Vomiting    "can't breathe", Has patient had a PCN reaction causing immediate rash, facial/tongue/throat swelling, SOB or lightheadedness with hypotension: Yes Has patient had a PCN reaction causing severe rash involving mucus membranes or skin necrosis: No Has patient had a PCN reaction that required hospitalization No Has patient had a PCN reaction occurring within the last 10 years: No If all of the above answers are "NO", then may proceed with Cephalosporin use.   . Latex Hives  . Baclofen Nausea Only    Jittery, anxious    Patient Measurements: Height: 5\' 7"  (170.2 cm) Weight: (!) 329 lb 12.8 oz (149.6 kg) IBW/kg (Calculated) : 61.6 Heparin Dosing Weight: 149.8 kg  Vital Signs: Temp: 97.7 F (36.5 C) (09/13 0238) Temp Source: Oral (09/13 0238) BP: 143/85 (09/13 0238) Pulse Rate: 124 (09/13 0238)  Labs:  Recent Labs  06/09/17 2327 06/10/17 0242  HGB 14.1  --   HCT 42.2  --   PLT 305  --   APTT 30  --   LABPROT 13.2  --   INR 1.01  --   CREATININE 0.91  --   TROPONINI 0.19* 0.19*    Estimated Creatinine Clearance: 114.3 mL/min (by C-G formula based on SCr of 0.91 mg/dL).   Medical History: Past Medical History:  Diagnosis Date  . Anxiety   . Arthritis    lower spine  . Chronic back pain   . Clinical depression 04/04/2012  . Depression   . Glaucoma    RIGHT EYE  . Headache(784.0)    migraines  . Ovarian cyst   . Pneumonia    walking pneumonia  . Sleep apnea    no cpcap     last sleep study > 4 yrs    Medications:  Scheduled:  . DULoxetine  30 mg Oral QHS  . fluticasone  2 spray Each Nare Daily  . gabapentin  600 mg Oral  QHS  . latanoprost  1 drop Both Eyes QHS  . sodium chloride flush  3 mL Intravenous Q12H    Assessment: Patient admitted x SOB and found to have acute PE on CT. Patient is being started on heparin drip for acute PE.  Goal of Therapy:  Heparin level 0.3-0.7 units/ml Monitor platelets by anticoagulation protocol: Yes   Plan:  An order was placed for a heparin bolus of 9150 units IV x 1 that was auto-verified in the ED and given. Will start heparin drip @ 1200 units/hr and will check a HL @ 0600 to ensure HL not supratherapeutic. Baseline labs ordered and WNL. Will continue to monitor CBC, and HL's.  Tobie Lords, PharmD, BCPS Clinical Pharmacist 06/10/2017

## 2017-06-10 NOTE — Consult Note (Signed)
    Discussed with Dr. Rockey Situ. It appears the patient has a PE with evidence of right-heart strain on CT. This is to be expected with a PE. Patient in IV heparin per IM. Recommend obtaining an echo. Cycle troponin level until peak, defer to IM. Clinical question of tPA vs no tPA could pehaps be addressed by pulmonology. Patient appears hemodynamically stable by vital signs. At this time, we defer usage of tPA to internal medicine. Await echo.

## 2017-06-10 NOTE — Progress Notes (Signed)
ANTICOAGULATION CONSULT NOTE - Initial Consult  Pharmacy Consult for heparin Indication: pulmonary embolus  Allergies  Allergen Reactions  . Morphine And Related Hives, Shortness Of Breath, Nausea And Vomiting and Other (See Comments)    "can't breathe"  . Penicillins Anaphylaxis, Hives, Shortness Of Breath and Nausea And Vomiting    "can't breathe", Has patient had a PCN reaction causing immediate rash, facial/tongue/throat swelling, SOB or lightheadedness with hypotension: Yes Has patient had a PCN reaction causing severe rash involving mucus membranes or skin necrosis: No Has patient had a PCN reaction that required hospitalization No Has patient had a PCN reaction occurring within the last 10 years: No If all of the above answers are "NO", then may proceed with Cephalosporin use.   . Latex Hives  . Baclofen Nausea Only    Jittery, anxious    Patient Measurements: Height: 5\' 7"  (170.2 cm) Weight: (!) 329 lb 12.8 oz (149.6 kg) IBW/kg (Calculated) : 61.6 Heparin Dosing Weight: 149.8 kg  Vital Signs: Temp: 98 F (36.7 C) (09/13 0541) Temp Source: Oral (09/13 0541) BP: 116/63 (09/13 0541) Pulse Rate: 116 (09/13 0541)  Labs:  Recent Labs  06/09/17 2327 06/10/17 0242 06/10/17 0604  HGB 14.1  --   --   HCT 42.2  --   --   PLT 305  --   --   APTT 30  --   --   LABPROT 13.2  --   --   INR 1.01  --   --   HEPARINUNFRC  --   --  0.66  CREATININE 0.91  --   --   TROPONINI 0.19* 0.19*  --     Estimated Creatinine Clearance: 114.3 mL/min (by C-G formula based on SCr of 0.91 mg/dL).   Medical History: Past Medical History:  Diagnosis Date  . Anxiety   . Arthritis    lower spine  . Chronic back pain   . Clinical depression 04/04/2012  . Depression   . Glaucoma    RIGHT EYE  . Headache(784.0)    migraines  . Ovarian cyst   . Pneumonia    walking pneumonia  . Sleep apnea    no cpcap     last sleep study > 4 yrs    Medications:  Scheduled:  . DULoxetine  30  mg Oral QHS  . fluticasone  2 spray Each Nare Daily  . gabapentin  600 mg Oral QHS  . latanoprost  1 drop Both Eyes QHS  . sodium chloride flush  3 mL Intravenous Q12H    Assessment: Patient admitted x SOB and found to have acute PE on CT. Patient is being started on heparin drip for acute PE.  Goal of Therapy:  Heparin level 0.3-0.7 units/ml Monitor platelets by anticoagulation protocol: Yes   Plan:  An order was placed for a heparin bolus of 9150 units IV x 1 that was auto-verified in the ED and given. Will start heparin drip @ 1200 units/hr and will check a HL @ 0600 to ensure HL not supratherapeutic. Baseline labs ordered and WNL. Will continue to monitor CBC, and HL's.  9/13 @ 0600 HL 0.66 therapeutic. Will continue current rate and will recheck HL @ 1200.  Tobie Lords, PharmD, BCPS Clinical Pharmacist 06/10/2017

## 2017-06-10 NOTE — Progress Notes (Signed)
[facility] [place] NAME: Kimberly Cowan    MR#:  [id number]  DATE OF BIRTH:  Jan 09, 1966  SUBJECTIVE:  CHIEF COMPLAINT:   Chief Complaint  Patient presents with  . Shortness of Breath   Came with SOB, have PE. Left leg is swollen. Had recent left knee surgery. Never had DVT or PE before.  REVIEW OF SYSTEMS:  CONSTITUTIONAL: No fever, fatigue or weakness.  EYES: No blurred or double vision.  EARS, NOSE, AND THROAT: No tinnitus or ear pain.  RESPIRATORY: No cough, positive for shortness of breath,no wheezing or hemoptysis.  CARDIOVASCULAR: No chest pain, orthopnea, edema.  GASTROINTESTINAL: No nausea, vomiting, diarrhea or abdominal pain.  GENITOURINARY: No dysuria, hematuria.  ENDOCRINE: No polyuria, nocturia,  HEMATOLOGY: No anemia, easy bruising or bleeding SKIN: No rash or lesion. MUSCULOSKELETAL: No joint pain or arthritis.   NEUROLOGIC: No tingling, numbness, weakness.  PSYCHIATRY: No anxiety or depression.   ROS  DRUG ALLERGIES:   Allergies  Allergen Reactions  . Morphine And Related Hives, Shortness Of Breath, Nausea And Vomiting and Other (See Comments)    "can't breathe"  . Penicillins Anaphylaxis, Hives, Shortness Of Breath and Nausea And Vomiting    "can't breathe", Has patient had a PCN reaction causing immediate rash, facial/tongue/throat swelling, SOB or lightheadedness with hypotension: Yes Has patient had a PCN reaction causing severe rash involving mucus membranes or skin necrosis: No Has patient had a PCN reaction that required hospitalization No Has patient had a PCN reaction occurring within the last 10 years: No If all of the above answers are "NO", then may proceed with Cephalosporin use.   . Latex Hives  . Baclofen Nausea Only    Jittery, anxious    VITALS:  Blood pressure 118/64, pulse (!) 110, temperature 98 F (36.7 C), temperature source Oral, resp. rate 18, height 5\' 7"  (1.702 m), weight  (!) 149.6 kg (329 lb 12.8 oz), SpO2 91 %.  PHYSICAL EXAMINATION:  GENERAL:  51 y.o.-year-old patient lying in the bed with no acute distress.  EYES: Pupils equal, round, reactive to light and accommodation. No scleral icterus. Extraocular muscles intact.  HEENT: Head atraumatic, normocephalic. Oropharynx and nasopharynx clear.  NECK:  Supple, no jugular venous distention. No thyroid enlargement, no tenderness.  LUNGS: Normal breath sounds bilaterally, no wheezing, rales,rhonchi or crepitation. No use of accessory muscles of respiration.  CARDIOVASCULAR: S1, S2 normal, tachycardia. No murmurs, rubs, or gallops.  ABDOMEN: Soft, nontender, nondistended. Bowel sounds present. No organomegaly or mass.  EXTREMITIES: No pedal edema, cyanosis, or clubbing. Left leg swelling.  NEUROLOGIC: Cranial nerves II through XII are intact. Muscle strength 5/5 in all extremities. Sensation intact. Gait not checked.  PSYCHIATRIC: The patient is alert and oriented x 3.  SKIN: No obvious rash, lesion, or ulcer.   Physical Exam LABORATORY PANEL:   CBC  Recent Labs Lab 06/10/17 0812  WBC 7.3  HGB 13.3  HCT 40.5  PLT 282   ------------------------------------------------------------------------------------------------------------------  Chemistries   Recent Labs Lab 06/09/17 2327 06/10/17 0812  NA 139 137  K 3.3* 3.2*  CL 102 101  CO2 24 25  GLUCOSE 155* 140*  BUN 10 11  CREATININE 0.91 0.93  CALCIUM 9.8 9.4  AST 25  --   ALT 14  --   ALKPHOS 82  --   BILITOT 0.6  --    ------------------------------------------------------------------------------------------------------------------  Cardiac Enzymes  Recent Labs Lab 06/10/17 0242 06/10/17 0812  TROPONINI 0.19* 0.14*   ------------------------------------------------------------------------------------------------------------------  RADIOLOGY:  Ct Angio Chest Pe W/cm &/or Wo Cm  Result Date: 06/10/2017 CLINICAL DATA:  52 y/o  F;  shortness of breath and hypoxia. EXAM: CT ANGIOGRAPHY CHEST WITH CONTRAST TECHNIQUE: Multidetector CT imaging of the chest was performed using the standard protocol during bolus administration of intravenous contrast. Multiplanar CT image reconstructions and MIPs were obtained to evaluate the vascular anatomy. CONTRAST:  100 cc Isovue 370 COMPARISON:  None. FINDINGS: Cardiovascular: Bilateral acute lobar pulmonary embolus. No saddle embolus. RV/LV ratio equals 1.5. Normal heart size. No pericardial effusion. Normal caliber thoracic aorta. Mediastinum/Nodes: No enlarged mediastinal, hilar, or axillary lymph nodes. Thyroid gland, trachea, and esophagus demonstrate no significant findings. Lungs/Pleura: Minor atelectasis in the lower lobes bilaterally. No consolidation. Upper Abdomen: No acute abnormality. Musculoskeletal: No chest wall abnormality. No acute or significant osseous findings. Review of the MIP images confirms the above findings. IMPRESSION: Positive for acute PE with CTevidence of right heart strain (RV/LV Ratio = 1.5) consistent with at least submassive (intermediate risk) PE. The presence of right heart strain has been associated with an increased risk of morbidity and mortality. These results were called by telephone at the time of interpretation on 06/10/2017 at 12:51 am to Dr. Lurline Hare , who verbally acknowledged these results. Electronically Signed   By: Kristine Garbe M.D.   On: 06/10/2017 00:52   Dg Chest Port 1 View  Result Date: 06/10/2017 CLINICAL DATA:  Dyspnea since yesterday morning. EXAM: PORTABLE CHEST 1 VIEW COMPARISON:  04/21/2016 FINDINGS: The heart size and mediastinal contours are within normal limits. Eventration of the diaphragms. Neural stimulator lead projects over the lower thoracic spine as before. Both lungs are clear. The visualized skeletal structures are unremarkable. IMPRESSION: No active disease. Electronically Signed   By: Ashley Royalty M.D.   On: 06/10/2017  00:00    ASSESSMENT AND PLAN:   Active Problems:   Pulmonary embolism (HCC)  * Pulmonary embolism   IV heparin   Echo and DVT study.   Cardio consult to decide need for thrombolysis.   Currently hemodynamically stable.  * Hypokalemia   Replace.  * Elevated troponin    Due to stress of PE.   Monitor on tele, get Echo.   All the records are reviewed and case discussed with Care Management/Social Workerr. Management plans discussed with the patient, family and they are in agreement.  CODE STATUS: Full.  TOTAL TIME TAKING CARE OF THIS PATIENT: 35 minutes.    Discussed with her daughter and son in law in room. POSSIBLE D/C IN 1-2 DAYS, DEPENDING ON CLINICAL CONDITION.   Vaughan Basta M.D on 06/10/2017   Between 7am to 6pm - Pager - [phone]  After 6pm go to [email or web address] - password [email or web address] [facility]  Office  [phone]  CC: Primary care physician; Glean Hess, MD  Note: This dictation was prepared with Dragon dictation along with smaller phrase technology. Any transcriptional errors that result from this process are unintentional.

## 2017-06-10 NOTE — ED Notes (Signed)
Attempted IV unsuccessful in left hand

## 2017-06-10 NOTE — Care Management (Signed)
patient admitted from home with pulmonary embolus - submassive w right heart strain.  She is currently on a heparin drip.  Current need for oxygen is acute.  She has not been ambulated since admission.  Recent knee surgery.  Says she has never taken anticoagulants at home. She has medicare/medicare part d/and medicaid.  Eliquis and Xarelto are on the medicaid preferred formulary.  Uses [facility] in [place].  Patient has special assistance with her medications and copays usually run from .20 - 5.00.  Discussed during progression of need to wean oxygen and perform home oxygen assessment.  She does not have a chronic respiratory/ cardiac diagnosis that would qualify her for home oxygen under medicare- even if sats are 88 or below

## 2017-06-10 NOTE — H&P (Signed)
[facility] [place] NAME: Kimberly Cowan    MR#:  [id number]  DATE OF BIRTH:  [birth year]  DATE OF ADMISSION:  06/09/2017  PRIMARY CARE PHYSICIAN: Glean Hess, MD   REQUESTING/REFERRING PHYSICIAN:   CHIEF COMPLAINT:   Chief Complaint  Patient presents with  . Shortness of Breath    HISTORY OF PRESENT ILLNESS: Kimberly Cowan  is a 51 y.o. female with a known history of anxiety disorder, arthritis, chronic back pain, glaucoma, sleep apnea, ovarian cyst presented to the emergency room with shortness of breath for the last few days. Patient had a knee surgery on the left side on October 30. Sutures were removed yesterday. Patient says she has been ambulating well. She felt short of breath for the last few days she was put on oxygen via nasal cannula in the emergency room. Her O2 saturation was around 86%. Patient was also tachycardic in the emergency room. She was evaluated with CT angiogram of the chest which showed pulmonary embolism.Patient was started on IV heparin drip for anticoagulation.  PAST MEDICAL HISTORY:   Past Medical History:  Diagnosis Date  . Anxiety   . Arthritis    lower spine  . Chronic back pain   . Clinical depression 04/04/2012  . Depression   . Glaucoma    RIGHT EYE  . Headache(784.0)    migraines  . Ovarian cyst   . Pneumonia    walking pneumonia  . Sleep apnea    no cpcap     last sleep study > 4 yrs    PAST SURGICAL HISTORY: Past Surgical History:  Procedure Laterality Date  . ABDOMINAL HYSTERECTOMY  10/2006  . BACK SURGERY  2014   lumbar fusion  . BACK SURGERY  2009   discectomy  . BREAST REDUCTION SURGERY    . c sections    . GASTRIC BYPASS  2005  . KNEE ARTHROSCOPY WITH LATERAL MENISECTOMY Right 05/27/2017   Procedure: KNEE ARTHROSCOPY WITH LATERAL MENISECTOMY;  Surgeon: Hessie Knows, MD;  Location: [facility] OR[facility];  Service: Orthopedics;  Laterality: Right;  . KNEE ARTHROSCOPY  WITH LATERAL RELEASE Right 05/27/2017   Procedure: KNEE ARTHROSCOPY WITH LATERAL RELEASE;  Surgeon: Hessie Knows, MD;  Location: [facility];  Service: Orthopedics;  Laterality: Right;  . LAPAROSCOPIC SALPINGO OOPHERECTOMY Bilateral 10/05/2016   Procedure: LAPAROSCOPIC SALPINGO OOPHORECTOMY;  Surgeon: Brayton Mars, MD;  Location: [facility];  Service: Gynecology;  Laterality: Bilateral;  . PELVIC LAPAROSCOPY    . REDUCTION MAMMAPLASTY Bilateral    2001  . SPINAL CORD STIMULATOR INSERTION N/A 03/20/2016   Procedure: LUMBAR SPINAL CORD STIMULATOR INSERTION;  Surgeon: Eustace Moore, MD;  Location: [facility];  Service: Neurosurgery;  Laterality: N/A;  . TONSILLECTOMY      SOCIAL HISTORY:  Social History  Substance Use Topics  . Smoking status: Never Smoker  . Smokeless tobacco: Never Used  . Alcohol use No    FAMILY HISTORY:  Family History  Problem Relation Age of Onset  . Diabetes Mother   . Hypertension Mother   . Diabetes Sister   . Hypertension Father   . Hypertension Sister   . Breast cancer Neg Hx     DRUG ALLERGIES:  Allergies  Allergen Reactions  . Morphine And Related Hives, Shortness Of Breath, Nausea And Vomiting and Other (See Comments)    "can't breathe"  . Penicillins Anaphylaxis, Hives, Shortness Of Breath and Nausea And Vomiting    "can't breathe",  Has patient had a PCN reaction causing immediate rash, facial/tongue/throat swelling, SOB or lightheadedness with hypotension: Yes Has patient had a PCN reaction causing severe rash involving mucus membranes or skin necrosis: No Has patient had a PCN reaction that required hospitalization No Has patient had a PCN reaction occurring within the last 10 years: No If all of the above answers are "NO", then may proceed with Cephalosporin use.   . Latex Hives  . Baclofen Nausea Only    Jittery, anxious    REVIEW OF SYSTEMS:   CONSTITUTIONAL: No fever, fatigue or weakness.  EYES: No blurred or double vision.   EARS, NOSE, AND THROAT: No tinnitus or ear pain.  RESPIRATORY: No cough,has shortness of breath,  No wheezing or hemoptysis.  CARDIOVASCULAR: No chest pain, orthopnea, edema.  GASTROINTESTINAL: No nausea, vomiting, diarrhea or abdominal pain.  GENITOURINARY: No dysuria, hematuria.  ENDOCRINE: No polyuria, nocturia,  HEMATOLOGY: No anemia, easy bruising or bleeding SKIN: No rash or lesion. MUSCULOSKELETAL: No joint pain or arthritis.   NEUROLOGIC: No tingling, numbness, weakness.  PSYCHIATRY: No anxiety or depression.   MEDICATIONS AT HOME:  Prior to Admission medications   Medication Sig Start Date End Date Taking? Authorizing Provider  Cetirizine HCl 10 MG CAPS Take 1 capsule (10 mg total) by mouth daily. Patient taking differently: Take 10 mg by mouth at bedtime.  04/07/17  Yes Glean Hess, MD  cyanocobalamin (,VITAMIN B-12,) 1000 MCG/ML injection Inject 1 mL (1,000 mcg total) into the muscle every 30 (thirty) days. 05/12/17  Yes Glean Hess, MD  DULoxetine (CYMBALTA) 30 MG capsule Take 30 mg by mouth at bedtime.  02/05/16  Yes [provider]  estradiol (ESTRACE) 1 MG tablet Take 1 tablet (1 mg total) by mouth daily. Patient taking differently: Take 1 mg by mouth at bedtime.  11/11/16  Yes Defrancesco, Alanda Slim, MD  fluticasone (FLONASE) 50 MCG/ACT nasal spray Place 2 sprays into both nostrils daily. Patient taking differently: Place 2 sprays into both nostrils daily as needed for allergies.  04/22/16  Yes Glean Hess, MD  gabapentin (NEURONTIN) 300 MG capsule Take 600 mg by mouth at bedtime.  01/01/16  Yes [provider]  ketoconazole (NIZORAL) 2 % shampoo Apply 1 application topically 2 (two) times a week. Patient taking differently: Apply 1 application topically daily as needed (for skin fungus on neck/back).  02/08/17  Yes Glean Hess, MD  latanoprost (XALATAN) 0.005 % ophthalmic solution Place 1 drop into both eyes at bedtime.    Yes [provider]  oxyCODONE-acetaminophen (ROXICET) 5-325 MG tablet Take 1-2 tablets by mouth every 6 (six) hours as needed. 05/27/17  Yes Hessie Knows, MD  rizatriptan (MAXALT-MLT) 10 MG disintegrating tablet TAKE 1 TABLET BY MOUTH AS NEEDED FOR MIGRAINE. MAY REPEAT IN 2 HOURS IF NEEDED. 04/19/16  Yes Glean Hess, MD  triamterene-hydrochlorothiazide (MAXZIDE-25) 37.5-25 MG tablet TAKE 1 TABLET BY MOUTH DAILY AS NEEDED Patient taking differently: Take 1 tablet by mouth daily as needed (for fluid retention/swelling in right leg or ankle).  05/02/17  Yes Glean Hess, MD  SYRINGE-NEEDLE, DISP, 3 ML (B-D 3CC LUER-LOK SYR 25GX1") 25G X 1" 3 ML MISC 1 each by Does not apply route every 30 (thirty) days. 05/12/17   Glean Hess, MD      PHYSICAL EXAMINATION:   VITAL SIGNS: Blood pressure 138/78, pulse (!) 121, temperature (!) 97.2 F (36.2 C), temperature source Oral, resp. rate 19, height 5' 7.5" (1.715 m),  weight (!) 152.9 kg (337 lb), SpO2 95 %.  GENERAL:  51 y.o.-year-old patient lying in the bed with no acute distress.  EYES: Pupils equal, round, reactive to light and accommodation. No scleral icterus. Extraocular muscles intact.  HEENT: Head atraumatic, normocephalic. Oropharynx and nasopharynx clear.  NECK:  Supple, no jugular venous distention. No thyroid enlargement, no tenderness.  LUNGS: Decreased breath sounds bilaterally, no wheezing, rales,rhonchi or crepitation. No use of accessory muscles of respiration.  CARDIOVASCULAR: S1, S2 tachycardia noted. No murmurs, rubs, or gallops.  ABDOMEN: Soft, nontender, nondistended. Bowel sounds present. No organomegaly or mass.  EXTREMITIES: No pedal edema, cyanosis, or clubbing.  NEUROLOGIC: Cranial nerves II through XII are intact. Muscle strength 5/5 in all extremities. Sensation intact. Gait not checked.  PSYCHIATRIC: The patient is alert and oriented x 3.  SKIN: No obvious rash, lesion, or ulcer.   LABORATORY PANEL:    CBC  Recent Labs Lab 06/09/17 2327  WBC 7.5  HGB 14.1  HCT 42.2  PLT 305  MCV 81.8  MCH 27.4  MCHC 33.5  RDW 15.2*  LYMPHSABS 2.5  MONOABS 0.6  EOSABS 0.1  BASOSABS 0.1   ------------------------------------------------------------------------------------------------------------------  Chemistries   Recent Labs Lab 06/09/17 2327  NA 139  K 3.3*  CL 102  CO2 24  GLUCOSE 155*  BUN 10  CREATININE 0.91  CALCIUM 9.8  AST 25  ALT 14  ALKPHOS 82  BILITOT 0.6   ------------------------------------------------------------------------------------------------------------------ estimated creatinine clearance is 116.6 mL/min (by C-G formula based on SCr of 0.91 mg/dL). ------------------------------------------------------------------------------------------------------------------ No results for input(s): TSH, T4TOTAL, T3FREE, THYROIDAB in the last 72 hours.  Invalid input(s): FREET3   Coagulation profile  Recent Labs Lab 06/09/17 2327  INR 1.01   ------------------------------------------------------------------------------------------------------------------- No results for input(s): DDIMER in the last 72 hours. -------------------------------------------------------------------------------------------------------------------  Cardiac Enzymes  Recent Labs Lab 06/09/17 2327  TROPONINI 0.19*   ------------------------------------------------------------------------------------------------------------------ Invalid input(s): POCBNP  ---------------------------------------------------------------------------------------------------------------  Urinalysis    Component Value Date/Time   COLORURINE YELLOW (A) 08/31/2016 2037   APPEARANCEUR HAZY (A) 08/31/2016 2037   APPEARANCEUR Hazy 10/31/2014 0731   LABSPEC 1.011 08/31/2016 2037   LABSPEC 1.013 10/31/2014 0731   PHURINE 6.0 08/31/2016 2037   GLUCOSEU NEGATIVE 08/31/2016 2037   GLUCOSEU Negative  10/31/2014 0731   HGBUR NEGATIVE 08/31/2016 2037   BILIRUBINUR NEGATIVE 08/31/2016 2037   BILIRUBINUR neg 07/04/2015 0946   BILIRUBINUR Negative 10/31/2014 0731   KETONESUR NEGATIVE 08/31/2016 2037   PROTEINUR NEGATIVE 08/31/2016 2037   UROBILINOGEN 0.2 07/04/2015 0946   NITRITE NEGATIVE 08/31/2016 2037   LEUKOCYTESUR 1+ (A) 08/31/2016 2037   LEUKOCYTESUR 1+ 10/31/2014 0731     RADIOLOGY: Ct Angio Chest Pe W/cm &/or Wo Cm  Result Date: 06/10/2017 CLINICAL DATA:  51 y/o  F; shortness of breath and hypoxia. EXAM: CT ANGIOGRAPHY CHEST WITH CONTRAST TECHNIQUE: Multidetector CT imaging of the chest was performed using the standard protocol during bolus administration of intravenous contrast. Multiplanar CT image reconstructions and MIPs were obtained to evaluate the vascular anatomy. CONTRAST:  100 cc Isovue 370 COMPARISON:  None. FINDINGS: Cardiovascular: Bilateral acute lobar pulmonary embolus. No saddle embolus. RV/LV ratio equals 1.5. Normal heart size. No pericardial effusion. Normal caliber thoracic aorta. Mediastinum/Nodes: No enlarged mediastinal, hilar, or axillary lymph nodes. Thyroid gland, trachea, and esophagus demonstrate no significant findings. Lungs/Pleura: Minor atelectasis in the lower lobes bilaterally. No consolidation. Upper Abdomen: No acute abnormality. Musculoskeletal: No chest wall abnormality. No acute or significant osseous findings. Review of the MIP images confirms the  above findings. IMPRESSION: Positive for acute PE with CTevidence of right heart strain (RV/LV Ratio = 1.5) consistent with at least submassive (intermediate risk) PE. The presence of right heart strain has been associated with an increased risk of morbidity and mortality. These results were called by telephone at the time of interpretation on 06/10/2017 at 12:51 am to Dr. Lurline Hare , who verbally acknowledged these results. Electronically Signed   By: Kristine Garbe M.D.   On: 06/10/2017 00:52   Dg  Chest Port 1 View  Result Date: 06/10/2017 CLINICAL DATA:  Dyspnea since yesterday morning. EXAM: PORTABLE CHEST 1 VIEW COMPARISON:  04/21/2016 FINDINGS: The heart size and mediastinal contours are within normal limits. Eventration of the diaphragms. Neural stimulator lead projects over the lower thoracic spine as before. Both lungs are clear. The visualized skeletal structures are unremarkable. IMPRESSION: No active disease. Electronically Signed   By: Ashley Royalty M.D.   On: 06/10/2017 00:00    EKG: Orders placed or performed during the hospital encounter of 06/09/17  . ED EKG  . ED EKG  . EKG 12-Lead  . EKG 12-Lead    IMPRESSION AND PLAN: 51 year old female patient with history of ovarian cyst, anxiety disorder, sleep apnea, glaucoma presented to the emergency room with increased shortness of breath for the last few days.  Admitting diagnosis 1. Acute pulmonary embolism 2. Dyspnea 3. Hypokalemia 4. Elevated troponin secondary to pulmonary embolism Treatment plan Admit patient to medical floor IV heparin drip for anti-coagulation Replace potassium Oxygen via nasal cannula Doppler ultrasound of lower extremities to assess for DVT  All the records are reviewed and case discussed with ED provider. Management plans discussed with the patient, family and they are in agreement.  CODE STATUS:FULL CODE Code Status History    Date Active Date Inactive Code Status Order ID Comments User Context   10/05/2016  5:30 PM 10/06/2016  5:43 PM Full Code [id number]  Defrancesco, Alanda Slim, MD Inpatient   03/20/2016  6:46 PM 03/21/2016  1:43 PM Full Code [id number]  Eustace Moore, MD Inpatient   08/12/2015  1:36 PM 08/13/2015  3:21 AM Full Code [id number]  Logan Bores, MD [place]   11/11/2012  2:21 PM 11/15/2012  6:21 PM Full Code [id number]  Pool, Cooper Render, MD Inpatient       TOTAL TIME TAKING CARE OF THIS PATIENT: 52 minutes.    Saundra Shelling M.D on 06/10/2017 at 2:01 AM  Between 7am to 6pm - Pager -  [phone]  After 6pm go to [email or web address] - password [email or web address] [facility]  [facility]  Office  [phone]  CC: Primary care physician; Glean Hess, MD

## 2017-06-10 NOTE — ED Notes (Signed)
Verbal order for heparin bolus of 9150 units. Heard by this Rn and Barrister's clerk

## 2017-06-11 DIAGNOSIS — R0602 Shortness of breath: Secondary | ICD-10-CM

## 2017-06-11 DIAGNOSIS — I82402 Acute embolism and thrombosis of unspecified deep veins of left lower extremity: Secondary | ICD-10-CM

## 2017-06-11 DIAGNOSIS — I2699 Other pulmonary embolism without acute cor pulmonale: Secondary | ICD-10-CM

## 2017-06-11 LAB — HEPARIN LEVEL (UNFRACTIONATED)
HEPARIN UNFRACTIONATED: 0.59 [IU]/mL (ref 0.30–0.70)
HEPARIN UNFRACTIONATED: 0.47 [IU]/mL (ref 0.30–0.70)
Heparin Unfractionated: 0.66 IU/mL (ref 0.30–0.70)

## 2017-06-11 LAB — CBC
HCT: 38.7 % (ref 35.0–47.0)
Hemoglobin: 12.7 g/dL (ref 12.0–16.0)
MCH: 27 pg (ref 26.0–34.0)
MCHC: 32.9 g/dL (ref 32.0–36.0)
MCV: 81.9 fL (ref 80.0–100.0)
Platelets: 275 10*3/uL (ref 150–440)
RBC: 4.72 MIL/uL (ref 3.80–5.20)
RDW: 15.8 % — AB (ref 11.5–14.5)
WBC: 7 10*3/uL (ref 3.6–11.0)

## 2017-06-11 NOTE — Care Management (Signed)
Have not finalized oral anticoagulation medication as of yet.  Will need coupon when decision is made.  Again discussed the need to wean oxygen as patient does not have a qualifying chronic diagnosis required to meet the medicare criteria for coverage. Discussed mobilization of patient during progression.  Was positive for DVT also.

## 2017-06-11 NOTE — Consult Note (Signed)
[facility] Vascular Consult Note  MRN : [id number]  Kimberly Cowan is a 51 y.o. (1965/12/02) female who presents with chief complaint of  Chief Complaint  Patient presents with  . Shortness of Breath  .  History of Present Illness: I as asked by Dr. Anselm Jungling to see the patient regarding submassive PE and residual LLE DVT.  She is two weeks s/p knee surgery and her mobility has been poor.  Presented with chest pain and shortness of breath and was found to have submassive PE on CT scan.  She was started on anticoagulation and tolerated that well.  Consideration for systemic lysis was given and this was not used in part because of clinical stability and likely some degree of concern with recent surgery.  She is now off of oxygen and breathing ok.  Some residual pain and swelling in her leg but she is still recovering from surgery as well.  The duplex shows popliteal and tibial vein DVT residual.  She had an IVC filter placed and removed with gastric bypass several years ago.    Current Facility-Administered Medications  Medication Dose Route Frequency Provider Last Rate Last Dose  . 0.9 %  sodium chloride infusion  250 mL Intravenous PRN Pyreddy, Reatha Harps, MD      . acetaminophen (TYLENOL) tablet 650 mg  650 mg Oral Q6H PRN Pyreddy, Reatha Harps, MD       Or  . acetaminophen (TYLENOL) suppository 650 mg  650 mg Rectal Q6H PRN Pyreddy, Reatha Harps, MD      . alum & mag hydroxide-simeth (MAALOX/MYLANTA) 200-200-20 MG/5ML suspension 30 mL  30 mL Oral Q6H PRN Vaughan Basta, MD   30 mL at 06/10/17 1203  . DULoxetine (CYMBALTA) DR capsule 30 mg  30 mg Oral QHS Saundra Shelling, MD   30 mg at 06/10/17 2125  . fluticasone (FLONASE) 50 MCG/ACT nasal spray 2 spray  2 spray Each Nare Daily Saundra Shelling, MD   2 spray at 06/11/17 0914  . gabapentin (NEURONTIN) capsule 600 mg  600 mg Oral QHS Pyreddy, Pavan, MD   600 mg at 06/10/17 2124  . heparin ADULT infusion 100 units/mL  (25000 units/248mL sodium chloride 0.45%)  1,600 Units/hr Intravenous Continuous Vaughan Basta, MD 16 mL/hr at 06/11/17 0137 1,600 Units/hr at 06/11/17 0137  . ketorolac (TORADOL) 30 MG/ML injection 30 mg  30 mg Intravenous Q6H PRN Saundra Shelling, MD   30 mg at 06/11/17 0914  . latanoprost (XALATAN) 0.005 % ophthalmic solution 1 drop  1 drop Both Eyes QHS Pyreddy, Reatha Harps, MD   1 drop at 06/10/17 2125  . ondansetron (ZOFRAN) tablet 4 mg  4 mg Oral Q6H PRN Saundra Shelling, MD   4 mg at 06/10/17 2023   Or  . ondansetron (ZOFRAN) injection 4 mg  4 mg Intravenous Q6H PRN Pyreddy, Reatha Harps, MD      . oxyCODONE (Oxy IR/ROXICODONE) immediate release tablet 5 mg  5 mg Oral Q4H PRN Saundra Shelling, MD   5 mg at 06/11/17 1216  . senna-docusate (Senokot-S) tablet 1 tablet  1 tablet Oral QHS PRN Pyreddy, Reatha Harps, MD      . sodium chloride flush (NS) 0.9 % injection 3 mL  3 mL Intravenous Q12H Pyreddy, Reatha Harps, MD   3 mL at 06/11/17 0914  . sodium chloride flush (NS) 0.9 % injection 3 mL  3 mL Intravenous PRN Saundra Shelling, MD        Past Medical History:  Diagnosis Date  . Anxiety   .  Arthritis    lower spine  . Chronic back pain   . Clinical depression 04/04/2012  . Depression   . Glaucoma    RIGHT EYE  . Headache(784.0)    migraines  . Ovarian cyst   . Pneumonia    walking pneumonia  . Sleep apnea    no cpcap     last sleep study > 4 yrs    Past Surgical History:  Procedure Laterality Date  . ABDOMINAL HYSTERECTOMY  10/2006  . BACK SURGERY  2014   lumbar fusion  . BACK SURGERY  2009   discectomy  . BREAST REDUCTION SURGERY    . c sections    . GASTRIC BYPASS  2005  . KNEE ARTHROSCOPY WITH LATERAL MENISECTOMY Right 05/27/2017   Procedure: KNEE ARTHROSCOPY WITH LATERAL MENISECTOMY;  Surgeon: Hessie Knows, MD;  Location: [facility] ORS;  Service: Orthopedics;  Laterality: Right;  . KNEE ARTHROSCOPY WITH LATERAL RELEASE Right 05/27/2017   Procedure: KNEE ARTHROSCOPY WITH LATERAL RELEASE;   Surgeon: Hessie Knows, MD;  Location: [facility] OR[facility];  Service: Orthopedics;  Laterality: Right;  . LAPAROSCOPIC SALPINGO OOPHERECTOMY Bilateral 10/05/2016   Procedure: LAPAROSCOPIC SALPINGO OOPHORECTOMY;  Surgeon: Brayton Mars, MD;  Location: [facility] ORS;  Service: Gynecology;  Laterality: Bilateral;  . PELVIC LAPAROSCOPY    . REDUCTION MAMMAPLASTY Bilateral    2001  . SPINAL CORD STIMULATOR INSERTION N/A 03/20/2016   Procedure: LUMBAR SPINAL CORD STIMULATOR INSERTION;  Surgeon: Eustace Moore, MD;  Location: [place] [facility];  Service: Neurosurgery;  Laterality: N/A;  . TONSILLECTOMY      Social History Social History  Substance Use Topics  . Smoking status: Never Smoker  . Smokeless tobacco: Never Used  . Alcohol use No  No IVDU  Family History Family History  Problem Relation Age of Onset  . Diabetes Mother   . Hypertension Mother   . Diabetes Sister   . Hypertension Father   . Hypertension Sister   . Breast cancer Neg Hx     Allergies  Allergen Reactions  . Morphine And Related Hives, Shortness Of Breath, Nausea And Vomiting and Other (See Comments)    "can't breathe"  . Penicillins Anaphylaxis, Hives, Shortness Of Breath and Nausea And Vomiting    "can't breathe", Has patient had a PCN reaction causing immediate rash, facial/tongue/throat swelling, SOB or lightheadedness with hypotension: Yes Has patient had a PCN reaction causing severe rash involving mucus membranes or skin necrosis: No Has patient had a PCN reaction that required hospitalization No Has patient had a PCN reaction occurring within the last 10 years: No If all of the above answers are "NO", then may proceed with Cephalosporin use.   . Latex Hives  . Baclofen Nausea Only    Jittery, anxious     REVIEW OF SYSTEMS (Negative unless checked)  Constitutional: [] Weight loss  [] Fever  [] Chills Cardiac: [x] Chest pain   [] Chest pressure   [] Palpitations   [] Shortness of breath when laying flat   [x] Shortness of  breath at rest   [] Shortness of breath with exertion. Vascular:  [] Pain in legs with walking   [] Pain in legs at rest   [] Pain in legs when laying flat   [] Claudication   [] Pain in feet when walking  [] Pain in feet at rest  [] Pain in feet when laying flat   [x] History of DVT   [x] Phlebitis   [] Swelling in legs   [] Varicose veins   [] Non-healing ulcers Pulmonary:   [] Uses home oxygen   [x] Productive cough   []   Hemoptysis   [] Wheeze  [] COPD   [] Asthma Neurologic:  [] Dizziness  [] Blackouts   [] Seizures   [] History of stroke   [] History of TIA  [] Aphasia   [] Temporary blindness   [] Dysphagia   [] Weakness or numbness in arms   [] Weakness or numbness in legs Musculoskeletal:  [x] Arthritis   [] Joint swelling   [] Joint pain   [x] Low back pain Hematologic:  [] Easy bruising  [] Easy bleeding   [] Hypercoagulable state   [] Anemic  [] Hepatitis Gastrointestinal:  [] Blood in stool   [] Vomiting blood  [] Gastroesophageal reflux/heartburn   [] Difficulty swallowing. Genitourinary:  [] Chronic kidney disease   [] Difficult urination  [] Frequent urination  [] Burning with urination   [] Blood in urine Skin:  [] Rashes   [] Ulcers   [] Wounds Psychological:  [] History of anxiety   []  History of major depression.  Physical Examination  Vitals:   06/10/17 1937 06/11/17 0403 06/11/17 0745 06/11/17 1529  BP: (!) 122/39 113/79 118/86   Pulse: (!) 116 (!) 109 (!) 110   Resp: 18 17 20    Temp: 97.9 F (36.6 C) 97.6 F (36.4 C)    TempSrc: Oral Oral    SpO2: 90% 92% 96% 95%  Weight:      Height:       Body mass index is 51.65 kg/m. Gen:  WD/WN, NAD. Morbidly obese Head: [place]/AT, No temporalis wasting.  Ear/Nose/Throat: Hearing grossly intact, nares w/o erythema or drainage, oropharynx w/o Erythema/Exudate Eyes: Sclera non-icteric, conjunctiva clear Neck: Trachea midline.  No JVD.  Pulmonary:  Good air movement, respirations not labored, equal bilaterally.  Cardiac: RRR, normal S1, S2. Vascular:  Vessel Right Left  Radial  Palpable Palpable                                    Musculoskeletal: M/S 5/5 throughout.  Extremities without ischemic changes.  No deformity or atrophy. Mild LLE edema. Neurologic: Sensation grossly intact in extremities.  Symmetrical.  Speech is fluent. Motor exam as listed above. Psychiatric: Judgment intact, Mood & affect appropriate for pt's clinical situation. Dermatologic: No rashes or ulcers noted.  No cellulitis or open wounds.       CBC Lab Results  Component Value Date   WBC 7.0 06/11/2017   HGB 12.7 06/11/2017   HCT 38.7 06/11/2017   MCV 81.9 06/11/2017   PLT 275 06/11/2017    BMET    Component Value Date/Time   NA 137 06/10/2017 0812   NA 141 04/07/2017 1414   NA 138 10/31/2014 0731   K 3.2 (L) 06/10/2017 0812   K 3.8 10/31/2014 0731   CL 101 06/10/2017 0812   CL 104 10/31/2014 0731   CO2 25 06/10/2017 0812   CO2 26 10/31/2014 0731   GLUCOSE 140 (H) 06/10/2017 0812   GLUCOSE 93 10/31/2014 0731   BUN 11 06/10/2017 0812   BUN 8 04/07/2017 1414   BUN 8 10/31/2014 0731   CREATININE 0.93 06/10/2017 0812   CREATININE 0.69 10/31/2014 0731   CALCIUM 9.4 06/10/2017 0812   CALCIUM 9.1 10/31/2014 0731   GFRNONAA >60 06/10/2017 0812   GFRNONAA >60 10/31/2014 0731   GFRAA >60 06/10/2017 0812   GFRAA >60 10/31/2014 0731   Estimated Creatinine Clearance: 111.8 mL/min (by C-G formula based on SCr of 0.93 mg/dL).  COAG Lab Results  Component Value Date   INR 1.01 06/09/2017   INR 1.05 03/20/2016    Radiology Ct Angio Chest Pe W/cm &/  or Wo Cm  Result Date: 06/10/2017 CLINICAL DATA:  51 y/o  F; shortness of breath and hypoxia. EXAM: CT ANGIOGRAPHY CHEST WITH CONTRAST TECHNIQUE: Multidetector CT imaging of the chest was performed using the standard protocol during bolus administration of intravenous contrast. Multiplanar CT image reconstructions and MIPs were obtained to evaluate the vascular anatomy. CONTRAST:  100 cc Isovue 370 COMPARISON:  None.  FINDINGS: Cardiovascular: Bilateral acute lobar pulmonary embolus. No saddle embolus. RV/LV ratio equals 1.5. Normal heart size. No pericardial effusion. Normal caliber thoracic aorta. Mediastinum/Nodes: No enlarged mediastinal, hilar, or axillary lymph nodes. Thyroid gland, trachea, and esophagus demonstrate no significant findings. Lungs/Pleura: Minor atelectasis in the lower lobes bilaterally. No consolidation. Upper Abdomen: No acute abnormality. Musculoskeletal: No chest wall abnormality. No acute or significant osseous findings. Review of the MIP images confirms the above findings. IMPRESSION: Positive for acute PE with CTevidence of right heart strain (RV/LV Ratio = 1.5) consistent with at least submassive (intermediate risk) PE. The presence of right heart strain has been associated with an increased risk of morbidity and mortality. These results were called by telephone at the time of interpretation on 06/10/2017 at 12:51 am to Dr. Lurline Hare , who verbally acknowledged these results. Electronically Signed   By: Kristine Garbe M.D.   On: 06/10/2017 00:52   US Venous Img Lower Unilateral Left  Result Date: 06/10/2017 CLINICAL DATA:  Left lower extremity swelling. EXAM: LEFT LOWER EXTREMITY VENOUS DOPPLER ULTRASOUND TECHNIQUE: Gray-scale sonography with graded compression, as well as color Doppler and duplex ultrasound were performed to evaluate the lower extremity deep venous systems from the level of the common femoral vein and including the common femoral, femoral, profunda femoral, popliteal and calf veins including the posterior tibial, peroneal and gastrocnemius veins when visible. The superficial great saphenous vein was also interrogated. Spectral Doppler was utilized to evaluate flow at rest and with distal augmentation maneuvers in the common femoral, femoral and popliteal veins. COMPARISON:  None. FINDINGS: Contralateral Common Femoral Vein: Respiratory phasicity is normal and symmetric  with the symptomatic side. No evidence of thrombus. Normal compressibility. Common Femoral Vein: No evidence of thrombus. Normal compressibility, respiratory phasicity and response to augmentation. Saphenofemoral Junction: No evidence of thrombus. Normal compressibility and flow on color Doppler imaging. Profunda Femoral Vein: No evidence of thrombus. Normal compressibility and flow on color Doppler imaging. Femoral Vein: No evidence of thrombus. Normal compressibility, respiratory phasicity and response to augmentation. Popliteal Vein:  Nonocclusive thrombus . Calf Veins: Nonocclusive thrombus is noted in a posterior tibial vein. Superficial Great Saphenous Vein: No evidence of thrombus. Normal compressibility and flow on color Doppler imaging. Other Findings:  None. IMPRESSION: Positive study for DVT. Nonocclusive thrombus noted in the left popliteal vein and a left posterior tibial vein. These results will be called to the ordering clinician or representative by the Radiologist Assistant, and communication documented in the PACS or zVision Dashboard. Electronically Signed   By: Marcello Moores  Register   On: 06/10/2017 16:37   Dg Chest Port 1 View  Result Date: 06/10/2017 CLINICAL DATA:  Dyspnea since yesterday morning. EXAM: PORTABLE CHEST 1 VIEW COMPARISON:  04/21/2016 FINDINGS: The heart size and mediastinal contours are within normal limits. Eventration of the diaphragms. Neural stimulator lead projects over the lower thoracic spine as before. Both lungs are clear. The visualized skeletal structures are unremarkable. IMPRESSION: No active disease. Electronically Signed   By: Ashley Royalty M.D.   On: 06/10/2017 00:00      Assessment/Plan 1. PE with residual  DVT. The residual DVT is fairly small and would be treated appropriately with anticoagulation.  She is now off of oxygen and breathing fairly well, so at current no need for pulmonary thrombolysis/thrombectomy.  She is also 2 weeks s/p knee surgery making  lysis a less attractive option. Would transition to oral anticoagulation and discharge when felt stable medically.  OK to ambulate.  Should get compression stockings as an outpatient and use these to reduce pain and swelling long term. 2. Recent knee surgery.  Likely that with her large size was the cause of her DVT.   3. Morbid obesity.  Clearly increases thrombotic risk.  Weight loss of benefit and increased activity would be good for her leg as well.   Leotis Pain, MD  06/11/2017 3:31 PM    This note was created with Dragon medical transcription system.  Any error is purely unintentional

## 2017-06-11 NOTE — Progress Notes (Signed)
ANTICOAGULATION CONSULT NOTE - Initial Consult  Pharmacy Consult for heparin Indication: pulmonary embolus  Allergies  Allergen Reactions  . Morphine And Related Hives, Shortness Of Breath, Nausea And Vomiting and Other (See Comments)    "can't breathe"  . Penicillins Anaphylaxis, Hives, Shortness Of Breath and Nausea And Vomiting    "can't breathe", Has patient had a PCN reaction causing immediate rash, facial/tongue/throat swelling, SOB or lightheadedness with hypotension: Yes Has patient had a PCN reaction causing severe rash involving mucus membranes or skin necrosis: No Has patient had a PCN reaction that required hospitalization No Has patient had a PCN reaction occurring within the last 10 years: No If all of the above answers are "NO", then may proceed with Cephalosporin use.   . Latex Hives  . Baclofen Nausea Only    Jittery, anxious    Patient Measurements: Height: 5\' 7"  (170.2 cm) Weight: (!) 329 lb 12.8 oz (149.6 kg) IBW/kg (Calculated) : 61.6 Heparin Dosing Weight: 98.8kg  Vital Signs: Temp: 97.9 F (36.6 C) (09/13 1937) Temp Source: Oral (09/13 1937) BP: 122/39 (09/13 1937) Pulse Rate: 116 (09/13 1937)  Labs:  Recent Labs  06/09/17 2327 06/10/17 0242  06/10/17 0812 06/10/17 1216 06/10/17 1908 06/11/17 0059  HGB 14.1  --   --  13.3  --   --   --   HCT 42.2  --   --  40.5  --   --   --   PLT 305  --   --  282  --   --   --   APTT 30  --   --   --   --   --   --   LABPROT 13.2  --   --   --   --   --   --   INR 1.01  --   --   --   --   --   --   HEPARINUNFRC  --   --   < >  --  0.20* 0.43 0.47  CREATININE 0.91  --   --  0.93  --   --   --   TROPONINI 0.19* 0.19*  --  0.14*  --   --   --   < > = values in this interval not displayed.  Estimated Creatinine Clearance: 111.8 mL/min (by C-G formula based on SCr of 0.93 mg/dL).   Medical History: Past Medical History:  Diagnosis Date  . Anxiety   . Arthritis    lower spine  . Chronic back pain    . Clinical depression 04/04/2012  . Depression   . Glaucoma    RIGHT EYE  . Headache(784.0)    migraines  . Ovarian cyst   . Pneumonia    walking pneumonia  . Sleep apnea    no cpcap     last sleep study > 4 yrs    Medications:  Scheduled:  . DULoxetine  30 mg Oral QHS  . fluticasone  2 spray Each Nare Daily  . gabapentin  600 mg Oral QHS  . latanoprost  1 drop Both Eyes QHS  . sodium chloride flush  3 mL Intravenous Q12H    Assessment: Patient admitted x SOB and found to have acute PE on CT. Patient is being started on heparin drip for acute PE.  Goal of Therapy:  Heparin level 0.3-0.7 units/ml Monitor platelets by anticoagulation protocol: Yes   Plan:  Heparin level low at 0.20. Due to indication (PE w/ right  heart strain) I will bolus 1500 units and increase rate to 1500 units/hr. Desiring a heparin level closer to 0.7 than 0.3 Heparin level in 6 hours  9/13 1908 HL 0.43. Will continue current heparin rate of 1500 units/hr and recheck heparin level in 6 hours.   9/14 @ 0059 HL 0.47 therapeutic; however, considering pt has PE w/ R heart strain will aim for a goal close to 0.7, so will increase rate to 1600 units/hr and will recheck HL @ 0700. CBC trending down but stable.  Tobie Lords, PharmD, BCPS Clinical Pharmacist 06/11/2017 1:29 AM

## 2017-06-11 NOTE — Progress Notes (Signed)
ANTICOAGULATION CONSULT NOTE - Initial Consult  Pharmacy Consult for heparin Indication: pulmonary embolus  Allergies  Allergen Reactions  . Morphine And Related Hives, Shortness Of Breath, Nausea And Vomiting and Other (See Comments)    "can't breathe"  . Penicillins Anaphylaxis, Hives, Shortness Of Breath and Nausea And Vomiting    "can't breathe", Has patient had a PCN reaction causing immediate rash, facial/tongue/throat swelling, SOB or lightheadedness with hypotension: Yes Has patient had a PCN reaction causing severe rash involving mucus membranes or skin necrosis: No Has patient had a PCN reaction that required hospitalization No Has patient had a PCN reaction occurring within the last 10 years: No If all of the above answers are "NO", then may proceed with Cephalosporin use.   . Latex Hives  . Baclofen Nausea Only    Jittery, anxious    Patient Measurements: Height: 5\' 7"  (170.2 cm) Weight: (!) 329 lb 12.8 oz (149.6 kg) IBW/kg (Calculated) : 61.6 Heparin Dosing Weight: 98.8kg  Vital Signs: Temp: 97.6 F (36.4 C) (09/14 0403) Temp Source: Oral (09/14 0403) BP: 113/79 (09/14 0403) Pulse Rate: 109 (09/14 0403)  Labs:  Recent Labs  06/09/17 2327 06/10/17 0242  06/10/17 0812  06/10/17 1908 06/11/17 0059 06/11/17 0713  HGB 14.1  --   --  13.3  --   --   --   --   HCT 42.2  --   --  40.5  --   --   --   --   PLT 305  --   --  282  --   --   --   --   APTT 30  --   --   --   --   --   --   --   LABPROT 13.2  --   --   --   --   --   --   --   INR 1.01  --   --   --   --   --   --   --   HEPARINUNFRC  --   --   < >  --   < > 0.43 0.47 0.59  CREATININE 0.91  --   --  0.93  --   --   --   --   TROPONINI 0.19* 0.19*  --  0.14*  --   --   --   --   < > = values in this interval not displayed.  Estimated Creatinine Clearance: 111.8 mL/min (by C-G formula based on SCr of 0.93 mg/dL).   Medical History: Past Medical History:  Diagnosis Date  . Anxiety   .  Arthritis    lower spine  . Chronic back pain   . Clinical depression 04/04/2012  . Depression   . Glaucoma    RIGHT EYE  . Headache(784.0)    migraines  . Ovarian cyst   . Pneumonia    walking pneumonia  . Sleep apnea    no cpcap     last sleep study > 4 yrs    Medications:  Scheduled:  . DULoxetine  30 mg Oral QHS  . fluticasone  2 spray Each Nare Daily  . gabapentin  600 mg Oral QHS  . latanoprost  1 drop Both Eyes QHS  . sodium chloride flush  3 mL Intravenous Q12H    Assessment: Patient admitted x SOB and found to have acute PE on CT. Patient is being started on heparin drip for acute PE w/ right  heart strain.  Goal of Therapy:  Heparin level 0.3-0.7 units/ml Monitor platelets by anticoagulation protocol: Yes   Plan:  Heparin level =0.59 Continue drip at 1600 units/hr and check confirmation level in 6 hours.   Ramond Dial, PharmD, BCPS Clinical Pharmacist 06/11/2017 8:03 AM

## 2017-06-11 NOTE — Plan of Care (Signed)
Problem: Safety: Goal: Ability to remain free from injury will improve Outcome: Progressing  Patient's VSS throughout the shift. Patient complaints of pain, PRN Toradol and Oxycodone given. Patient ambulated this shift and tolerated in room air. RN will continue to monitor.

## 2017-06-11 NOTE — Progress Notes (Signed)
Talked to Dr. Marthann Schiller about walking patient, per MD ok to walk patient and need to evaluate oxygen saturation at rest and if tolerating without oxygen then to ambulate in room air. No other concern at the moment. RN will continue to monitor.

## 2017-06-11 NOTE — Progress Notes (Signed)
ANTICOAGULATION CONSULT NOTE - Initial Consult  Pharmacy Consult for heparin Indication: pulmonary embolus  Allergies  Allergen Reactions  . Morphine And Related Hives, Shortness Of Breath, Nausea And Vomiting and Other (See Comments)    "can't breathe"  . Penicillins Anaphylaxis, Hives, Shortness Of Breath and Nausea And Vomiting    "can't breathe", Has patient had a PCN reaction causing immediate rash, facial/tongue/throat swelling, SOB or lightheadedness with hypotension: Yes Has patient had a PCN reaction causing severe rash involving mucus membranes or skin necrosis: No Has patient had a PCN reaction that required hospitalization No Has patient had a PCN reaction occurring within the last 10 years: No If all of the above answers are "NO", then may proceed with Cephalosporin use.   . Latex Hives  . Baclofen Nausea Only    Jittery, anxious    Patient Measurements: Height: 5\' 7"  (170.2 cm) Weight: (!) 329 lb 12.8 oz (149.6 kg) IBW/kg (Calculated) : 61.6 Heparin Dosing Weight: 98.8kg  Vital Signs: Temp: 97.6 F (36.4 C) (09/14 0403) Temp Source: Oral (09/14 0403) BP: 118/86 (09/14 0745) Pulse Rate: 110 (09/14 0745)  Labs:  Recent Labs  06/09/17 2327 06/10/17 0242  06/10/17 0812  06/11/17 0059 06/11/17 0713 06/11/17 1315  HGB 14.1  --   --  13.3  --   --   --  12.7  HCT 42.2  --   --  40.5  --   --   --  38.7  PLT 305  --   --  282  --   --   --  275  APTT 30  --   --   --   --   --   --   --   LABPROT 13.2  --   --   --   --   --   --   --   INR 1.01  --   --   --   --   --   --   --   HEPARINUNFRC  --   --   < >  --   < > 0.47 0.59 0.66  CREATININE 0.91  --   --  0.93  --   --   --   --   TROPONINI 0.19* 0.19*  --  0.14*  --   --   --   --   < > = values in this interval not displayed.  Estimated Creatinine Clearance: 111.8 mL/min (by C-G formula based on SCr of 0.93 mg/dL).   Medical History: Past Medical History:  Diagnosis Date  . Anxiety   .  Arthritis    lower spine  . Chronic back pain   . Clinical depression 04/04/2012  . Depression   . Glaucoma    RIGHT EYE  . Headache(784.0)    migraines  . Ovarian cyst   . Pneumonia    walking pneumonia  . Sleep apnea    no cpcap     last sleep study > 4 yrs    Medications:  Scheduled:  . DULoxetine  30 mg Oral QHS  . fluticasone  2 spray Each Nare Daily  . gabapentin  600 mg Oral QHS  . latanoprost  1 drop Both Eyes QHS  . sodium chloride flush  3 mL Intravenous Q12H    Assessment: Patient admitted x SOB and found to have acute PE on CT. Patient is being started on heparin drip for acute PE w/ right heart strain.  Goal of Therapy:  Heparin level 0.3-0.7 units/ml Monitor platelets by anticoagulation protocol: Yes   Plan:  Heparin level =0.66 Continue drip at 1600 units/hr Check HL and CBC daily  Ramond Dial, PharmD, BCPS Clinical Pharmacist 06/11/2017 2:19 PM

## 2017-06-11 NOTE — Progress Notes (Signed)
Ambulated patient without oxygen and patient tolerated well, SpO2 was 96% in room air did complaints of pain, will give PRN pain medication. RN will continue to monitor.

## 2017-06-11 NOTE — Progress Notes (Signed)
[facility] NAME: Kimberly Cowan    MR#:  [id number]  DATE OF BIRTH:  12-24-1965  SUBJECTIVE:  CHIEF COMPLAINT:   Chief Complaint  Patient presents with  . Shortness of Breath   Came with SOB, have PE. Left leg is swollen. Had recent left knee surgery. Never had DVT or PE before.   Was SOB while went to bathroom this morning.  REVIEW OF SYSTEMS:  CONSTITUTIONAL: No fever, fatigue or weakness.  EYES: No blurred or double vision.  EARS, NOSE, AND THROAT: No tinnitus or ear pain.  RESPIRATORY: No cough, positive for shortness of breath,no wheezing or hemoptysis.  CARDIOVASCULAR: No chest pain, orthopnea, edema.  GASTROINTESTINAL: No nausea, vomiting, diarrhea or abdominal pain.  GENITOURINARY: No dysuria, hematuria.  ENDOCRINE: No polyuria, nocturia,  HEMATOLOGY: No anemia, easy bruising or bleeding SKIN: No rash or lesion. MUSCULOSKELETAL: No joint pain or arthritis.   NEUROLOGIC: No tingling, numbness, weakness.  PSYCHIATRY: No anxiety or depression.   ROS  DRUG ALLERGIES:   Allergies  Allergen Reactions  . Morphine And Related Hives, Shortness Of Breath, Nausea And Vomiting and Other (See Comments)    "can't breathe"  . Penicillins Anaphylaxis, Hives, Shortness Of Breath and Nausea And Vomiting    "can't breathe", Has patient had a PCN reaction causing immediate rash, facial/tongue/throat swelling, SOB or lightheadedness with hypotension: Yes Has patient had a PCN reaction causing severe rash involving mucus membranes or skin necrosis: No Has patient had a PCN reaction that required hospitalization No Has patient had a PCN reaction occurring within the last 10 years: No If all of the above answers are "NO", then may proceed with Cephalosporin use.   . Latex Hives  . Baclofen Nausea Only    Jittery, anxious    VITALS:  Blood pressure 118/86, pulse (!) 110, temperature 97.6 F (36.4 C), temperature source  Oral, resp. rate 20, height 5\' 7"  (1.702 m), weight (!) 149.6 kg (329 lb 12.8 oz), SpO2 96 %.  PHYSICAL EXAMINATION:  GENERAL:  51 y.o.-year-old patient lying in the bed with no acute distress.  EYES: Pupils equal, round, reactive to light and accommodation. No scleral icterus. Extraocular muscles intact.  HEENT: Head atraumatic, normocephalic. Oropharynx and nasopharynx clear.  NECK:  Supple, no jugular venous distention. No thyroid enlargement, no tenderness.  LUNGS: Normal breath sounds bilaterally, no wheezing, rales,rhonchi or crepitation. No use of accessory muscles of respiration.  CARDIOVASCULAR: S1, S2 normal, tachycardia. No murmurs, rubs, or gallops.  ABDOMEN: Soft, nontender, nondistended. Bowel sounds present. No organomegaly or mass.  EXTREMITIES: No pedal edema, cyanosis, or clubbing. Left leg swelling.  NEUROLOGIC: Cranial nerves II through XII are intact. Muscle strength 5/5 in all extremities. Sensation intact. Gait not checked.  PSYCHIATRIC: The patient is alert and oriented x 3.  SKIN: No obvious rash, lesion, or ulcer.   Physical Exam LABORATORY PANEL:   CBC  Recent Labs Lab 06/11/17 1315  WBC 7.0  HGB 12.7  HCT 38.7  PLT 275   ------------------------------------------------------------------------------------------------------------------  Chemistries   Recent Labs Lab 06/09/17 2327 06/10/17 0812  NA 139 137  K 3.3* 3.2*  CL 102 101  CO2 24 25  GLUCOSE 155* 140*  BUN 10 11  CREATININE 0.91 0.93  CALCIUM 9.8 9.4  AST 25  --   ALT 14  --   ALKPHOS 82  --   BILITOT 0.6  --    ------------------------------------------------------------------------------------------------------------------  Cardiac Enzymes  Recent Labs Lab  06/10/17 0242 06/10/17 0812  TROPONINI 0.19* 0.14*   ------------------------------------------------------------------------------------------------------------------  RADIOLOGY:  Ct Angio Chest Pe W/cm &/or Wo  Cm  Result Date: 06/10/2017 CLINICAL DATA:  51 y/o  F; shortness of breath and hypoxia. EXAM: CT ANGIOGRAPHY CHEST WITH CONTRAST TECHNIQUE: Multidetector CT imaging of the chest was performed using the standard protocol during bolus administration of intravenous contrast. Multiplanar CT image reconstructions and MIPs were obtained to evaluate the vascular anatomy. CONTRAST:  100 cc Isovue 370 COMPARISON:  None. FINDINGS: Cardiovascular: Bilateral acute lobar pulmonary embolus. No saddle embolus. RV/LV ratio equals 1.5. Normal heart size. No pericardial effusion. Normal caliber thoracic aorta. Mediastinum/Nodes: No enlarged mediastinal, hilar, or axillary lymph nodes. Thyroid gland, trachea, and esophagus demonstrate no significant findings. Lungs/Pleura: Minor atelectasis in the lower lobes bilaterally. No consolidation. Upper Abdomen: No acute abnormality. Musculoskeletal: No chest wall abnormality. No acute or significant osseous findings. Review of the MIP images confirms the above findings. IMPRESSION: Positive for acute PE with CTevidence of right heart strain (RV/LV Ratio = 1.5) consistent with at least submassive (intermediate risk) PE. The presence of right heart strain has been associated with an increased risk of morbidity and mortality. These results were called by telephone at the time of interpretation on 06/10/2017 at 12:51 am to Dr. Lurline Hare , who verbally acknowledged these results. Electronically Signed   By: Kristine Garbe M.D.   On: 06/10/2017 00:52   US Venous Img Lower Unilateral Left  Result Date: 06/10/2017 CLINICAL DATA:  Left lower extremity swelling. EXAM: LEFT LOWER EXTREMITY VENOUS DOPPLER ULTRASOUND TECHNIQUE: Gray-scale sonography with graded compression, as well as color Doppler and duplex ultrasound were performed to evaluate the lower extremity deep venous systems from the level of the common femoral vein and including the common femoral, femoral, profunda femoral,  popliteal and calf veins including the posterior tibial, peroneal and gastrocnemius veins when visible. The superficial great saphenous vein was also interrogated. Spectral Doppler was utilized to evaluate flow at rest and with distal augmentation maneuvers in the common femoral, femoral and popliteal veins. COMPARISON:  None. FINDINGS: Contralateral Common Femoral Vein: Respiratory phasicity is normal and symmetric with the symptomatic side. No evidence of thrombus. Normal compressibility. Common Femoral Vein: No evidence of thrombus. Normal compressibility, respiratory phasicity and response to augmentation. Saphenofemoral Junction: No evidence of thrombus. Normal compressibility and flow on color Doppler imaging. Profunda Femoral Vein: No evidence of thrombus. Normal compressibility and flow on color Doppler imaging. Femoral Vein: No evidence of thrombus. Normal compressibility, respiratory phasicity and response to augmentation. Popliteal Vein:  Nonocclusive thrombus . Calf Veins: Nonocclusive thrombus is noted in a posterior tibial vein. Superficial Great Saphenous Vein: No evidence of thrombus. Normal compressibility and flow on color Doppler imaging. Other Findings:  None. IMPRESSION: Positive study for DVT. Nonocclusive thrombus noted in the left popliteal vein and a left posterior tibial vein. These results will be called to the ordering clinician or representative by the Radiologist Assistant, and communication documented in the PACS or zVision Dashboard. Electronically Signed   By: Marcello Moores  Register   On: 06/10/2017 16:37   Dg Chest Port 1 View  Result Date: 06/10/2017 CLINICAL DATA:  Dyspnea since yesterday morning. EXAM: PORTABLE CHEST 1 VIEW COMPARISON:  04/21/2016 FINDINGS: The heart size and mediastinal contours are within normal limits. Eventration of the diaphragms. Neural stimulator lead projects over the lower thoracic spine as before. Both lungs are clear. The visualized skeletal structures  are unremarkable. IMPRESSION: No active disease. Electronically Signed  By: Ashley Royalty M.D.   On: 06/10/2017 00:00    ASSESSMENT AND PLAN:   Active Problems:   Pulmonary embolism (HCC)  * Pulmonary embolism   IV heparin   Echo and DVT study- reviewed, elevated PA pressure RV is some dilated.   Left Leg DVT   Cardio suggest pulm to decide thrombolytics.   As pt is gettign better, and vitals stable- also had Knee surgery 2 weeks ago, we may not give Thrombolytics.   Currently hemodynamically stable.   Called Vascular consult to decide need for IVC filter.  * DVT   On heparin    Vascular consult.  * Hypokalemia   Replace.  * Elevated troponin    Due to stress of PE.   Monitor on tele, get Echo.   All the records are reviewed and case discussed with Care Management/Social Workerr. Management plans discussed with the patient, family and they are in agreement.  CODE STATUS: Full.  TOTAL TIME TAKING CARE OF THIS PATIENT: 35 minutes.    Discussed with her daughter and son in law in room. POSSIBLE D/C IN 1-2 DAYS, DEPENDING ON CLINICAL CONDITION.   Vaughan Basta M.D on 06/11/2017   Between 7am to 6pm - Pager - [phone]  After 6pm go to [email or web address] - password [email or web address] [facility]  Office  [phone]  CC: Primary care physician; Glean Hess, MD  Note: This dictation was prepared with Dragon dictation along with smaller phrase technology. Any transcriptional errors that result from this process are unintentional.

## 2017-06-11 NOTE — Care Management Important Message (Signed)
Important Message  Patient Details  Name: Kimberly Cowan MRN: [id number] Date of Birth: December 15, 1965   Medicare Important Message Given:  Yes Signed IM notice given    Katrina Stack, RN 06/11/2017, 2:03 PM

## 2017-06-12 LAB — CBC
HEMATOCRIT: 37.7 % (ref 35.0–47.0)
Hemoglobin: 12.4 g/dL (ref 12.0–16.0)
MCH: 27.5 pg (ref 26.0–34.0)
MCHC: 33 g/dL (ref 32.0–36.0)
MCV: 83.5 fL (ref 80.0–100.0)
Platelets: 265 10*3/uL (ref 150–440)
RBC: 4.52 MIL/uL (ref 3.80–5.20)
RDW: 15.9 % — AB (ref 11.5–14.5)
WBC: 7 10*3/uL (ref 3.6–11.0)

## 2017-06-12 LAB — HEPARIN LEVEL (UNFRACTIONATED): HEPARIN UNFRACTIONATED: 0.64 [IU]/mL (ref 0.30–0.70)

## 2017-06-12 MED ORDER — APIXABAN 5 MG PO TABS: 10.0000 mg | ORAL_TABLET | Freq: Two times a day (BID) | ORAL | 1 refills | Status: DC

## 2017-06-12 MED ORDER — APIXABAN 5 MG PO TABS: 5.0000 mg | ORAL_TABLET | Freq: Two times a day (BID) | ORAL | Status: DC

## 2017-06-12 MED ORDER — APIXABAN 5 MG PO TABS: 10.0000 mg | ORAL_TABLET | Freq: Two times a day (BID) | ORAL | Status: DC

## 2017-06-12 NOTE — Plan of Care (Signed)
Problem: Health Behavior/Discharge Planning: Goal: Ability to manage health-related needs will improve Outcome: Completed/Met Date Met: 06/12/17 Discharge meds, discharge instructions, and instructions re eliquis

## 2017-06-12 NOTE — Progress Notes (Signed)
ANTICOAGULATION CONSULT NOTE - Initial Consult  Pharmacy Consult for apixaban  Indication: pulmonary embolus  Allergies  Allergen Reactions  . Morphine And Related Hives, Shortness Of Breath, Nausea And Vomiting and Other (See Comments)    "can't breathe"  . Penicillins Anaphylaxis, Hives, Shortness Of Breath and Nausea And Vomiting    "can't breathe", Has patient had a PCN reaction causing immediate rash, facial/tongue/throat swelling, SOB or lightheadedness with hypotension: Yes Has patient had a PCN reaction causing severe rash involving mucus membranes or skin necrosis: No Has patient had a PCN reaction that required hospitalization No Has patient had a PCN reaction occurring within the last 10 years: No If all of the above answers are "NO", then may proceed with Cephalosporin use.   . Latex Hives  . Baclofen Nausea Only    Jittery, anxious    Patient Measurements: Height: 5\' 7"  (170.2 cm) Weight: (!) 329 lb 12.8 oz (149.6 kg) IBW/kg (Calculated) : 61.6   Vital Signs: Temp: 98.2 F (36.8 C) (09/15 0744) Temp Source: Oral (09/15 0744) BP: 119/87 (09/15 0744) Pulse Rate: 107 (09/15 0744)  Labs:  Recent Labs  06/09/17 2327 06/10/17 0242  06/10/17 0812  06/11/17 0713 06/11/17 1315 06/12/17 0701  HGB 14.1  --   --  13.3  --   --  12.7 12.4  HCT 42.2  --   --  40.5  --   --  38.7 37.7  PLT 305  --   --  282  --   --  275 265  APTT 30  --   --   --   --   --   --   --   LABPROT 13.2  --   --   --   --   --   --   --   INR 1.01  --   --   --   --   --   --   --   HEPARINUNFRC  --   --   < >  --   < > 0.59 0.66 0.64  CREATININE 0.91  --   --  0.93  --   --   --   --   TROPONINI 0.19* 0.19*  --  0.14*  --   --   --   --   < > = values in this interval not displayed.  Estimated Creatinine Clearance: 111.8 mL/min (by C-G formula based on SCr of 0.93 mg/dL).   Medical History: Past Medical History:  Diagnosis Date  . Anxiety   . Arthritis    lower spine  .  Chronic back pain   . Clinical depression 04/04/2012  . Depression   . Glaucoma    RIGHT EYE  . Headache(784.0)    migraines  . Ovarian cyst   . Pneumonia    walking pneumonia  . Sleep apnea    no cpcap     last sleep study > 4 yrs       Assessment: 9/15 Patient was on heparin drip for treatment of PE and is being switched to apixaban. Patient will receive apixaban 10mg  BID for 7 days and then apixaban 5mg  BID.   Goal of Therapy:   Monitor platelets by anticoagulation protocol: Yes   Plan:  9/15 Heparin drip will be stopped, and patient will receive apixaban 10mg  BID for 7 days and then apixaban 5mg  BID.   Lendon Ka, PharmD Pharmacy Resident 06/12/2017,11:19 AM

## 2017-06-12 NOTE — Discharge Summary (Signed)
Kimberly Cowan, is a 51 y.o. female  DOB [birth year]  MRN [id number].  Admission date:  06/09/2017  Admitting Physician  Saundra Shelling, MD  Discharge Date:  06/12/2017   Primary MD  Glean Hess, MD  Recommendations for primary care physician for things to follow:   Follow with PCP in 1 week   Admission Diagnosis  Shortness of breath [R06.02] Hypoxia [R09.02] Elevated troponin [R74.8] Other acute pulmonary embolism with acute cor pulmonale (HCC) [I26.09]   Discharge Diagnosis  Shortness of breath [R06.02] Hypoxia [R09.02] Elevated troponin [R74.8] Other acute pulmonary embolism with acute cor pulmonale (HCC) [I26.09]    Active Problems:   Pulmonary embolism (HCC)      Past Medical History:  Diagnosis Date  . Anxiety   . Arthritis    lower spine  . Chronic back pain   . Clinical depression 04/04/2012  . Depression   . Glaucoma    RIGHT EYE  . Headache(784.0)    migraines  . Ovarian cyst   . Pneumonia    walking pneumonia  . Sleep apnea    no cpcap     last sleep study > 4 yrs    Past Surgical History:  Procedure Laterality Date  . ABDOMINAL HYSTERECTOMY  10/2006  . BACK SURGERY  2014   lumbar fusion  . BACK SURGERY  2009   discectomy  . BREAST REDUCTION SURGERY    . c sections    . GASTRIC BYPASS  2005  . KNEE ARTHROSCOPY WITH LATERAL MENISECTOMY Right 05/27/2017   Procedure: KNEE ARTHROSCOPY WITH LATERAL MENISECTOMY;  Surgeon: Hessie Knows, MD;  Location: [facility];  Service: Orthopedics;  Laterality: Right;  . KNEE ARTHROSCOPY WITH LATERAL RELEASE Right 05/27/2017   Procedure: KNEE ARTHROSCOPY WITH LATERAL RELEASE;  Surgeon: Hessie Knows, MD;  Location: [facility];  Service: Orthopedics;  Laterality: Right;  . LAPAROSCOPIC SALPINGO OOPHERECTOMY Bilateral 10/05/2016   Procedure:  LAPAROSCOPIC SALPINGO OOPHORECTOMY;  Surgeon: Brayton Mars, MD;  Location: [facility];  Service: Gynecology;  Laterality: Bilateral;  . PELVIC LAPAROSCOPY    . REDUCTION MAMMAPLASTY Bilateral    2001  . SPINAL CORD STIMULATOR INSERTION N/A 03/20/2016   Procedure: LUMBAR SPINAL CORD STIMULATOR INSERTION;  Surgeon: Eustace Moore, MD;  Location: [facility];  Service: Neurosurgery;  Laterality: N/A;  . TONSILLECTOMY         History of present illness and  Hospital Course:     Kindly see H&P for history of present illness and admission details, please review complete Labs, Consult reports and Test reports for all details in brief  HPI  from the history and physical done on the day of admission  51 year old female patient did on September 13 because of shortness of breath, hypoxia with oxygen saturation 80% on room air. CT and your chest showed pulmonary embolus. Patient admitted to medical service, started on IV heparin. Patient had sinus tachycardia with heart rate up to 1 20 bpm.  Hospital Course  #1 acute respiratory failure with hypoxia secondary to pulmonary embolus. Patient had recent history of left knee repair on AUg30TH by Dr. Rudene Christians. And she went home aspirin alone. Patient started on IV heparin. Ultrasound of the leg showed left leg DVT. Patient shortness of breath improved with IV heparin. Hypoxia also resolving. 2 minutes saturations improved to more than 90%. She feels well today, denies shortness of breath. Check.6 minute walking o2 saturation to see if she qualifies for home oxygen. Transition from IV heparin  to Eliquis. Patient can take up with Eliquis 10 mg by mouth twice a day for 7 days followed by 5 mg by mouth twice a day until further notice. Patient can can 10 mg ofpo bid of apixaban till 9/21.start 5 mg po Bid of apixaban after that. Patient can follow with PCP in 1 week. Discussed the risk and benefits of anticoagulation with her. Echocardiogram showed EF 60-65%,  elevated right heart pressure secondary to PE. Seen by vascular surgeon Dr. dew also, he suggested that clot is too small to do thrombolysis  For  the DVT.  #2 history of anxiety, depression. Patient is on Cymbalta. #3. history of neuropathy: Continue Neurontin 600 mg at night We'll discontinue Estrace at discharge secondary to DVT and PE. #4 essential hypertension: Controlled, continue Maxzide at discharge.   Discharge Condition: full   Follow UP      Discharge Instructions  and  Discharge Medications     Allergies as of 06/12/2017      Reactions   Morphine And Related Hives, Shortness Of Breath, Nausea And Vomiting, Other (See Comments)   "can't breathe"   Penicillins Anaphylaxis, Hives, Shortness Of Breath, Nausea And Vomiting   "can't breathe", Has patient had a PCN reaction causing immediate rash, facial/tongue/throat swelling, SOB or lightheadedness with hypotension: Yes Has patient had a PCN reaction causing severe rash involving mucus membranes or skin necrosis: No Has patient had a PCN reaction that required hospitalization No Has patient had a PCN reaction occurring within the last 10 years: No If all of the above answers are "NO", then may proceed with Cephalosporin use.   Latex Hives   Baclofen Nausea Only   Jittery, anxious      Medication List    STOP taking these medications   estradiol 1 MG tablet Commonly known as:  ESTRACE   oxyCODONE-acetaminophen 5-325 MG tablet Commonly known as:  ROXICET     TAKE these medications   apixaban 5 MG Tabs tablet Commonly known as:  ELIQUIS Take 2 tablets (10 mg total) by mouth 2 (two) times daily.   Cetirizine HCl 10 MG Caps Take 1 capsule (10 mg total) by mouth daily. What changed:  when to take this   cyanocobalamin 1000 MCG/ML injection Commonly known as:  (VITAMIN B-12) Inject 1 mL (1,000 mcg total) into the muscle every 30 (thirty) days.   DULoxetine 30 MG capsule Commonly known as:  CYMBALTA Take 30  mg by mouth at bedtime.   fluticasone 50 MCG/ACT nasal spray Commonly known as:  FLONASE Place 2 sprays into both nostrils daily. What changed:  when to take this  reasons to take this   gabapentin 300 MG capsule Commonly known as:  NEURONTIN Take 600 mg by mouth at bedtime.   ketoconazole 2 % shampoo Commonly known as:  NIZORAL Apply 1 application topically 2 (two) times a week. What changed:  when to take this  reasons to take this   latanoprost 0.005 % ophthalmic solution Commonly known as:  XALATAN Place 1 drop into both eyes at bedtime.   rizatriptan 10 MG disintegrating tablet Commonly known as:  MAXALT-MLT TAKE 1 TABLET BY MOUTH AS NEEDED FOR MIGRAINE. MAY REPEAT IN 2 HOURS IF NEEDED.   SYRINGE-NEEDLE (DISP) 3 ML 25G X 1" 3 ML Misc Commonly known as:  B-D 3CC LUER-LOK SYR 25GX1" 1 each by Does not apply route every 30 (thirty) days.   triamterene-hydrochlorothiazide 37.5-25 MG tablet Commonly known as:  MAXZIDE-25 TAKE 1 TABLET BY  MOUTH DAILY AS NEEDED What changed:  reasons to take this            Discharge Care Instructions        Start     Ordered   06/12/17 0000  apixaban (ELIQUIS) 5 MG TABS tablet  2 times daily    Comments:  Take Apixaban 10 mg po BID  For one week followed by 5 mg po bid starting from 06/19/17.   06/12/17 1128        Diet and Activity recommendation: See Discharge Instructions above   Consults obtained - Cardiology, vascular surgery   Major procedures and Radiology Reports - PLEASE review detailed and final reports for all details, in brief -      Ct Angio Chest Pe W/cm &/or Wo Cm  Result Date: 06/10/2017 CLINICAL DATA:  51 y/o  F; shortness of breath and hypoxia. EXAM: CT ANGIOGRAPHY CHEST WITH CONTRAST TECHNIQUE: Multidetector CT imaging of the chest was performed using the standard protocol during bolus administration of intravenous contrast. Multiplanar CT image reconstructions and MIPs were obtained to evaluate  the vascular anatomy. CONTRAST:  100 cc Isovue 370 COMPARISON:  None. FINDINGS: Cardiovascular: Bilateral acute lobar pulmonary embolus. No saddle embolus. RV/LV ratio equals 1.5. Normal heart size. No pericardial effusion. Normal caliber thoracic aorta. Mediastinum/Nodes: No enlarged mediastinal, hilar, or axillary lymph nodes. Thyroid gland, trachea, and esophagus demonstrate no significant findings. Lungs/Pleura: Minor atelectasis in the lower lobes bilaterally. No consolidation. Upper Abdomen: No acute abnormality. Musculoskeletal: No chest wall abnormality. No acute or significant osseous findings. Review of the MIP images confirms the above findings. IMPRESSION: Positive for acute PE with CTevidence of right heart strain (RV/LV Ratio = 1.5) consistent with at least submassive (intermediate risk) PE. The presence of right heart strain has been associated with an increased risk of morbidity and mortality. These results were called by telephone at the time of interpretation on 06/10/2017 at 12:51 am to Dr. Lurline Hare , who verbally acknowledged these results. Electronically Signed   By: Kristine Garbe M.D.   On: 06/10/2017 00:52   US Venous Img Lower Unilateral Left  Result Date: 06/10/2017 CLINICAL DATA:  Left lower extremity swelling. EXAM: LEFT LOWER EXTREMITY VENOUS DOPPLER ULTRASOUND TECHNIQUE: Gray-scale sonography with graded compression, as well as color Doppler and duplex ultrasound were performed to evaluate the lower extremity deep venous systems from the level of the common femoral vein and including the common femoral, femoral, profunda femoral, popliteal and calf veins including the posterior tibial, peroneal and gastrocnemius veins when visible. The superficial great saphenous vein was also interrogated. Spectral Doppler was utilized to evaluate flow at rest and with distal augmentation maneuvers in the common femoral, femoral and popliteal veins. COMPARISON:  None. FINDINGS:  Contralateral Common Femoral Vein: Respiratory phasicity is normal and symmetric with the symptomatic side. No evidence of thrombus. Normal compressibility. Common Femoral Vein: No evidence of thrombus. Normal compressibility, respiratory phasicity and response to augmentation. Saphenofemoral Junction: No evidence of thrombus. Normal compressibility and flow on color Doppler imaging. Profunda Femoral Vein: No evidence of thrombus. Normal compressibility and flow on color Doppler imaging. Femoral Vein: No evidence of thrombus. Normal compressibility, respiratory phasicity and response to augmentation. Popliteal Vein:  Nonocclusive thrombus . Calf Veins: Nonocclusive thrombus is noted in a posterior tibial vein. Superficial Great Saphenous Vein: No evidence of thrombus. Normal compressibility and flow on color Doppler imaging. Other Findings:  None. IMPRESSION: Positive study for DVT. Nonocclusive thrombus noted in the left popliteal  vein and a left posterior tibial vein. These results will be called to the ordering clinician or representative by the Radiologist Assistant, and communication documented in the PACS or zVision Dashboard. Electronically Signed   By: Marcello Moores  Register   On: 06/10/2017 16:37   Dg Chest Port 1 View  Result Date: 06/10/2017 CLINICAL DATA:  Dyspnea since yesterday morning. EXAM: PORTABLE CHEST 1 VIEW COMPARISON:  04/21/2016 FINDINGS: The heart size and mediastinal contours are within normal limits. Eventration of the diaphragms. Neural stimulator lead projects over the lower thoracic spine as before. Both lungs are clear. The visualized skeletal structures are unremarkable. IMPRESSION: No active disease. Electronically Signed   By: Ashley Royalty M.D.   On: 06/10/2017 00:00    Micro Results    No results found for this or any previous visit (from the past 240 hour(s)).     Today   Subjective:   Kimberly Cowan today has no headache,no chest abdominal pain,no new weakness  tingling or numbness, feels much better wants to go home today.  Objective:   Blood pressure 119/87, pulse (!) 107, temperature 98.2 F (36.8 C), temperature source Oral, resp. rate 14, height 5\' 7"  (1.702 m), weight (!) 149.6 kg (329 lb 12.8 oz), SpO2 90 %.   Intake/Output Summary (Last 24 hours) at 06/12/17 1130 Last data filed at 06/12/17 0900  Gross per 24 hour  Intake              848 ml  Output              300 ml  Net              548 ml    Exam Awake Alert, Oriented x 3, No new F.N deficits, Normal affect, Morbidly obese female. [place].AT,PERRAL Supple Neck,No JVD, No cervical lymphadenopathy appriciated.  Symmetrical Chest wall movement, Good air movement bilaterally, CTAB RRR,No Gallops,Rubs or new Murmurs, No Parasternal Heave +ve B.Sounds, Abd Soft, Non tender, No organomegaly appriciated, No rebound -guarding or rigidity. No Cyanosis, Clubbing or edema, No new Rash or bruise  Data Review   CBC w Diff: Lab Results  Component Value Date   WBC 7.0 06/12/2017   HGB 12.4 06/12/2017   HGB 11.2 07/04/2015   HCT 37.7 06/12/2017   HCT 35.8 07/04/2015   PLT 265 06/12/2017   PLT 288 07/04/2015   LYMPHOPCT 33 06/09/2017   LYMPHOPCT 51.0 10/31/2014   MONOPCT 8 06/09/2017   MONOPCT 8.8 10/31/2014   EOSPCT 1 06/09/2017   EOSPCT 2.2 10/31/2014   BASOPCT 1 06/09/2017   BASOPCT 1.0 10/31/2014    CMP: Lab Results  Component Value Date   NA 137 06/10/2017   NA 141 04/07/2017   NA 138 10/31/2014   K 3.2 (L) 06/10/2017   K 3.8 10/31/2014   CL 101 06/10/2017   CL 104 10/31/2014   CO2 25 06/10/2017   CO2 26 10/31/2014   BUN 11 06/10/2017   BUN 8 04/07/2017   BUN 8 10/31/2014   CREATININE 0.93 06/10/2017   CREATININE 0.69 10/31/2014   PROT 8.2 (H) 06/09/2017   PROT 6.9 08/05/2016   PROT 7.9 10/31/2014   ALBUMIN 3.5 06/09/2017   ALBUMIN 3.7 08/05/2016   ALBUMIN 3.4 10/31/2014   BILITOT 0.6 06/09/2017   BILITOT 0.2 08/05/2016   BILITOT 0.5 10/31/2014   ALKPHOS 82  06/09/2017   ALKPHOS 85 10/31/2014   AST 25 06/09/2017   AST 17 10/31/2014   ALT 14 06/09/2017  ALT 16 10/31/2014  .   Total Time in preparing paper work, data evaluation and todays exam - 38 minutes  Tarsha Blando M.D on 06/12/2017 at 11:30 AM    Note: This dictation was prepared with Dragon dictation along with smaller phrase technology. Any transcriptional errors that result from this process are unintentional.

## 2017-06-12 NOTE — Care Management Note (Signed)
Case Management Note  Patient Details  Name: Janylah Belgrave MRN: [id number] Date of Birth: 1966-09-28  Subjective/Objective:       Ms Palacios was provided with an Eliquis coupon.              Action/Plan:   Expected Discharge Date:  06/12/17               Expected Discharge Plan:     In-House Referral:     Discharge planning Services  Other - See comment (provided an Eliquis coupon)  Post Acute Care Choice:  NA Choice offered to:  NA  DME Arranged:  N/A DME Agency:  NA  HH Arranged:    [place] Agency:  NA  Status of Service:  Completed, signed off  If discussed at [place] of Stay Meetings, dates discussed:    Additional Comments:  Tobin Cadiente A, RN 06/12/2017, 12:29 PM

## 2017-06-12 NOTE — Progress Notes (Signed)
ANTICOAGULATION CONSULT NOTE - Initial Consult  Pharmacy Consult for heparin Indication: pulmonary embolus  Allergies  Allergen Reactions  . Morphine And Related Hives, Shortness Of Breath, Nausea And Vomiting and Other (See Comments)    "can't breathe"  . Penicillins Anaphylaxis, Hives, Shortness Of Breath and Nausea And Vomiting    "can't breathe", Has patient had a PCN reaction causing immediate rash, facial/tongue/throat swelling, SOB or lightheadedness with hypotension: Yes Has patient had a PCN reaction causing severe rash involving mucus membranes or skin necrosis: No Has patient had a PCN reaction that required hospitalization No Has patient had a PCN reaction occurring within the last 10 years: No If all of the above answers are "NO", then may proceed with Cephalosporin use.   . Latex Hives  . Baclofen Nausea Only    Jittery, anxious    Patient Measurements: Height: 5\' 7"  (170.2 cm) Weight: (!) 329 lb 12.8 oz (149.6 kg) IBW/kg (Calculated) : 61.6 Heparin Dosing Weight: 98.8kg  Vital Signs: Temp: 98.2 F (36.8 C) (09/15 0744) Temp Source: Oral (09/15 0744) BP: 119/87 (09/15 0744) Pulse Rate: 107 (09/15 0744)  Labs:  Recent Labs  06/09/17 2327 06/10/17 0242  06/10/17 0812  06/11/17 0713 06/11/17 1315 06/12/17 0701  HGB 14.1  --   --  13.3  --   --  12.7 12.4  HCT 42.2  --   --  40.5  --   --  38.7 37.7  PLT 305  --   --  282  --   --  275 265  APTT 30  --   --   --   --   --   --   --   LABPROT 13.2  --   --   --   --   --   --   --   INR 1.01  --   --   --   --   --   --   --   HEPARINUNFRC  --   --   < >  --   < > 0.59 0.66 0.64  CREATININE 0.91  --   --  0.93  --   --   --   --   TROPONINI 0.19* 0.19*  --  0.14*  --   --   --   --   < > = values in this interval not displayed.  Estimated Creatinine Clearance: 111.8 mL/min (by C-G formula based on SCr of 0.93 mg/dL).   Medical History: Past Medical History:  Diagnosis Date  . Anxiety   .  Arthritis    lower spine  . Chronic back pain   . Clinical depression 04/04/2012  . Depression   . Glaucoma    RIGHT EYE  . Headache(784.0)    migraines  . Ovarian cyst   . Pneumonia    walking pneumonia  . Sleep apnea    no cpcap     last sleep study > 4 yrs    Medications:  Scheduled:  . DULoxetine  30 mg Oral QHS  . fluticasone  2 spray Each Nare Daily  . gabapentin  600 mg Oral QHS  . latanoprost  1 drop Both Eyes QHS  . sodium chloride flush  3 mL Intravenous Q12H    Assessment: Patient admitted x SOB and found to have acute PE on CT. Patient is being started on heparin drip for acute PE w/ right heart strain.  Goal of Therapy:  Heparin level 0.3-0.7 units/ml Monitor  platelets by anticoagulation protocol: Yes   Plan:  Heparin level within goal range. Continue drip at 1600 units/hr Check HL and CBC daily  Sashay Felling D, Pharmd Clinical Pharmacist 06/12/2017 7:56 AM

## 2017-06-12 NOTE — Progress Notes (Signed)
A&O. Up with one assist. No complaints. No apparent distress. Heparin drip at 16 ml/hr. Will continue to monitor.

## 2017-06-25 ENCOUNTER — Encounter: Payer: Self-pay | Admitting: Internal Medicine

## 2017-06-25 ENCOUNTER — Telehealth: Payer: Self-pay

## 2017-06-25 ENCOUNTER — Ambulatory Visit (INDEPENDENT_AMBULATORY_CARE_PROVIDER_SITE_OTHER): Payer: Medicare HMO | Admitting: Internal Medicine

## 2017-06-25 VITALS — BP 128/64 | HR 92 | Ht 67.5 in | Wt 335.0 lb

## 2017-06-25 DIAGNOSIS — H7292 Unspecified perforation of tympanic membrane, left ear: Secondary | ICD-10-CM

## 2017-06-25 DIAGNOSIS — M5416 Radiculopathy, lumbar region: Secondary | ICD-10-CM

## 2017-06-25 DIAGNOSIS — I2699 Other pulmonary embolism without acute cor pulmonale: Secondary | ICD-10-CM | POA: Diagnosis not present

## 2017-06-25 NOTE — Telephone Encounter (Signed)
Patient called stating her PT stated they need a written note stating that its okay for patient to begin PT since she has blood clot.  Please Advise.  Fax# [place]

## 2017-06-25 NOTE — Progress Notes (Signed)
Date:  06/25/2017   Name:  Kimberly Cowan   DOB:  03-11-1966   MRN:  [id number]   Chief Complaint: Hospitalization Follow-up (Was in hospital for 4 days. Feeling much better. Bloot clot was in each lung, and one in right leg. Wants to discuss why cannot take estradoil, or oxycodone any more. Wants to see if can get referral to Vascular Doctor. ) and Shortness of Breath (Wants to discuss getting oxygen for when at home or out and about. States she becomes SOB when walking short distance. ) Admitted to [facility] on 06/09/17 and d/c 06/12/17.  Estrogen was discontinued.  She started on Eliquis which she has tolerated well.  Minimal bleeding with self B12 injections but no other bleeding. 51 year old female patient admitted on September 13 because of shortness of breath, hypoxia with oxygen saturation 80% on room air. CT  chest showed pulmonary embolus. Patient admitted to medical service, started on IV heparin. Patient had sinus tachycardia with heart rate up to 1 20 bpm.  DVT found in LLE, too small for intervention.  Trigger was arthroscopic knee surgery one week earlier. She was also told to stop oxycodone given to her by pain MD for chronic back pain.  She is unsure why.  Ear fluid - patient had tubes placed in her ears over 20 years ago. She's noticed since then that the left ear tends to get fluid in it and has difficulty removing it. She wants to take swimming lessons and wants to have her ear checked to see if she needs to wear earplugs.  Review of Systems  Constitutional: Negative for chills, fatigue and fever.  HENT: Positive for ear discharge.   Eyes: Negative for visual disturbance.  Respiratory: Positive for shortness of breath. Negative for chest tightness and wheezing.   Cardiovascular: Positive for leg swelling. Negative for chest pain and palpitations.  Gastrointestinal: Negative for blood in stool.  Genitourinary: Negative for difficulty urinating, hematuria and urgency.    Musculoskeletal: Positive for arthralgias and back pain.  Neurological: Negative for dizziness and headaches.  Hematological: Bruises/bleeds easily.  Psychiatric/Behavioral: Negative for dysphoric mood and sleep disturbance.    Patient Active Problem List   Diagnosis Date Noted  . Pulmonary embolism (Kimberly Cowan) 06/10/2017  . Environmental and seasonal allergies 04/07/2017  . Knee pain, left 04/07/2017  . Major depressive disorder with single episode, in partial remission ([place]) 04/07/2017  . Tinea versicolor 02/03/2017  . Vasomotor symptoms due to menopause 11/11/2016  . Status post bilateral salpingo-oophorectomy (BSO) 10/05/2016  . Morbid obesity ([place]) 09/10/2016  . Status post laparoscopic supracervical hysterectomy 09/10/2016  . Right lower quadrant pain 09/10/2016  . B12 nutritional deficiency 08/05/2016  . Obstructive sleep apnea of adult 03/04/2016  . Bilateral lumbar radiculopathy 08/14/2015  . S/P spinal surgery 08/14/2015  . Status post gastric bypass for obesity 07/04/2015  . Prediabetes 07/01/2015  . Spinal stenosis, lumbar region, with neurogenic claudication 11/11/2012  . Headache, migraine 04/04/2012    Prior to Admission medications   Medication Sig Start Date End Date Taking? Authorizing Provider  apixaban (ELIQUIS) 5 MG TABS tablet Take 2 tablets (10 mg total) by mouth 2 (two) times daily. 06/12/17  Yes Kimberly Lesches, MD  Cetirizine HCl 10 MG CAPS Take 1 capsule (10 mg total) by mouth daily. Patient taking differently: Take 10 mg by mouth at bedtime.  04/07/17  Yes Kimberly Hess, MD  cyanocobalamin (,VITAMIN B-12,) 1000 MCG/ML injection Inject 1 mL (1,000 mcg total) into the muscle  every 30 (thirty) days. 05/12/17  Yes Kimberly Hess, MD  DULoxetine (CYMBALTA) 30 MG capsule Take 30 mg by mouth at bedtime.  02/05/16  Yes [provider]  fluticasone (FLONASE) 50 MCG/ACT nasal spray Place 2 sprays into both nostrils daily. Patient taking differently:  Place 2 sprays into both nostrils daily as needed for allergies.  04/22/16  Yes Kimberly Hess, MD  gabapentin (NEURONTIN) 300 MG capsule Take 600 mg by mouth at bedtime.  01/01/16  Yes [provider]  ketoconazole (NIZORAL) 2 % shampoo Apply 1 application topically 2 (two) times a week. Patient taking differently: Apply 1 application topically daily as needed (for skin fungus on neck/back).  02/08/17  Yes Kimberly Hess, MD  latanoprost (XALATAN) 0.005 % ophthalmic solution Place 1 drop into both eyes at bedtime.    Yes [provider]  rizatriptan (MAXALT-MLT) 10 MG disintegrating tablet TAKE 1 TABLET BY MOUTH AS NEEDED FOR MIGRAINE. MAY REPEAT IN 2 HOURS IF NEEDED. 04/19/16  Yes Kimberly Hess, MD  SYRINGE-NEEDLE, DISP, 3 ML (B-D 3CC LUER-LOK SYR 25GX1") 25G X 1" 3 ML MISC 1 each by Does not apply route every 30 (thirty) days. 05/12/17  Yes Kimberly Hess, MD  triamterene-hydrochlorothiazide (MAXZIDE-25) 37.5-25 MG tablet TAKE 1 TABLET BY MOUTH DAILY AS NEEDED Patient taking differently: Take 1 tablet by mouth daily as needed (for fluid retention/swelling in right leg or ankle).  05/02/17  Yes Kimberly Hess, MD    Allergies  Allergen Reactions  . Morphine And Related Hives, Shortness Of Breath, Nausea And Vomiting and Other (See Comments)    "can't breathe"  . Penicillins Anaphylaxis, Hives, Shortness Of Breath and Nausea And Vomiting    "can't breathe", Has patient had a PCN reaction causing immediate rash, facial/tongue/throat swelling, SOB or lightheadedness with hypotension: Yes Has patient had a PCN reaction causing severe rash involving mucus membranes or skin necrosis: No Has patient had a PCN reaction that required hospitalization No Has patient had a PCN reaction occurring within the last 10 years: No If all of the above answers are "NO", then may proceed with Cephalosporin use.   . Latex Hives  . Baclofen Nausea Only    Jittery, anxious    Past  Surgical History:  Procedure Laterality Date  . ABDOMINAL HYSTERECTOMY  10/2006  . BACK SURGERY  2014   lumbar fusion  . BACK SURGERY  2009   discectomy  . BREAST REDUCTION SURGERY    . c sections    . GASTRIC BYPASS  2005  . KNEE ARTHROSCOPY WITH LATERAL MENISECTOMY Right 05/27/2017   Procedure: KNEE ARTHROSCOPY WITH LATERAL MENISECTOMY;  Surgeon: Hessie Knows, MD;  Location: [facility] ORS;  Service: Orthopedics;  Laterality: Right;  . KNEE ARTHROSCOPY WITH LATERAL RELEASE Right 05/27/2017   Procedure: KNEE ARTHROSCOPY WITH LATERAL RELEASE;  Surgeon: Hessie Knows, MD;  Location: [facility] ORS;  Service: Orthopedics;  Laterality: Right;  . LAPAROSCOPIC SALPINGO OOPHERECTOMY Bilateral 10/05/2016   Procedure: LAPAROSCOPIC SALPINGO OOPHORECTOMY;  Surgeon: Brayton Mars, MD;  Location: [facility] ORS;  Service: Gynecology;  Laterality: Bilateral;  . PELVIC LAPAROSCOPY    . REDUCTION MAMMAPLASTY Bilateral    2001  . SPINAL CORD STIMULATOR INSERTION N/A 03/20/2016   Procedure: LUMBAR SPINAL CORD STIMULATOR INSERTION;  Surgeon: Eustace Moore, MD;  Location: [place] [facility];  Service: Neurosurgery;  Laterality: N/A;  . TONSILLECTOMY      Social History  Substance Use Topics  . Smoking status: Never Smoker  .  Smokeless tobacco: Never Used  . Alcohol use No     Medication list has been reviewed and updated.  PHQ 2/9 Scores 06/25/2017 03/04/2016 08/14/2015 07/04/2015  PHQ - 2 Score - 0 0 0  Exception Documentation Medical reason - - -    Physical Exam  Constitutional: She is oriented to person, place, and time. She appears well-developed. No distress.  HENT:  Head: Normocephalic and atraumatic.  Right Ear: Tympanic membrane is not perforated.  Left Ear: Tympanic membrane is perforated.  Ears:  Cardiovascular: Normal rate, regular rhythm and normal heart sounds.   Pulmonary/Chest: Effort normal and breath sounds normal. No respiratory distress. She has no wheezes. She has no rales.    Musculoskeletal: Normal range of motion. She exhibits edema (1+ pitting lower right leg) and tenderness (steri strips in place on knee incisions.).       Left lower leg: She exhibits tenderness (to palpation, otherwise soft).  Knee surgery incisions healed  Neurological: She is alert and oriented to person, place, and time.  Skin: Skin is warm and dry. No rash noted.  Psychiatric: She has a normal mood and affect. Her behavior is normal. Thought content normal.  Nursing note and vitals reviewed.   BP 128/64 (BP Location: Right Wrist, Patient Position: Sitting, Cuff Size: Normal)   Pulse 92   Ht 5' 7.5" (1.715 m)   Wt (!) 335 lb (152 kg)   SpO2 97%   BMI 51.69 kg/m   Assessment and Plan: 1. Other pulmonary embolism without acute cor pulmonale, unspecified chronicity (HCC) Will need anticoagulation for 6 months Stop all estrogen Advance activity as tolerated  2. Bilateral lumbar radiculopathy May continue oxycodone prescribed by pain management  3. Perforated ear drum, left Recommend ear plugs while swimming   No orders of the defined types were placed in this encounter.   Partially dictated using Editor, commissioning. Any errors are unintentional.  Halina Maidens, MD [facility]  06/25/2017   No orders of the defined types were placed in this encounter.   Partially dictated using Editor, commissioning. Any errors are unintentional.  Halina Maidens, MD [facility]  06/25/2017

## 2017-06-30 DIAGNOSIS — M25562 Pain in left knee: Secondary | ICD-10-CM | POA: Diagnosis not present

## 2017-06-30 DIAGNOSIS — Z9889 Other specified postprocedural states: Secondary | ICD-10-CM | POA: Diagnosis not present

## 2017-07-02 ENCOUNTER — Other Ambulatory Visit: Payer: Self-pay | Admitting: Internal Medicine

## 2017-07-05 DIAGNOSIS — M25562 Pain in left knee: Secondary | ICD-10-CM | POA: Diagnosis not present

## 2017-07-05 DIAGNOSIS — Z9889 Other specified postprocedural states: Secondary | ICD-10-CM | POA: Diagnosis not present

## 2017-07-08 DIAGNOSIS — Z9889 Other specified postprocedural states: Secondary | ICD-10-CM | POA: Diagnosis not present

## 2017-07-08 DIAGNOSIS — M25562 Pain in left knee: Secondary | ICD-10-CM | POA: Diagnosis not present

## 2017-07-13 DIAGNOSIS — M25562 Pain in left knee: Secondary | ICD-10-CM | POA: Diagnosis not present

## 2017-07-13 DIAGNOSIS — Z9889 Other specified postprocedural states: Secondary | ICD-10-CM | POA: Diagnosis not present

## 2017-07-15 DIAGNOSIS — M25562 Pain in left knee: Secondary | ICD-10-CM | POA: Diagnosis not present

## 2017-07-15 DIAGNOSIS — Z9889 Other specified postprocedural states: Secondary | ICD-10-CM | POA: Diagnosis not present

## 2017-07-22 DIAGNOSIS — H40153 Residual stage of open-angle glaucoma, bilateral: Secondary | ICD-10-CM | POA: Diagnosis not present

## 2017-07-23 DIAGNOSIS — M25562 Pain in left knee: Secondary | ICD-10-CM | POA: Diagnosis not present

## 2017-07-23 DIAGNOSIS — Z9889 Other specified postprocedural states: Secondary | ICD-10-CM | POA: Diagnosis not present

## 2017-07-27 DIAGNOSIS — Z9889 Other specified postprocedural states: Secondary | ICD-10-CM | POA: Diagnosis not present

## 2017-07-27 DIAGNOSIS — M25562 Pain in left knee: Secondary | ICD-10-CM | POA: Diagnosis not present

## 2017-07-30 ENCOUNTER — Other Ambulatory Visit: Payer: Self-pay

## 2017-07-30 ENCOUNTER — Telehealth: Payer: Self-pay | Admitting: Internal Medicine

## 2017-07-30 MED ORDER — APIXABAN 5 MG PO TABS: 5.0000 mg | ORAL_TABLET | Freq: Two times a day (BID) | ORAL | 2 refills | Status: DC

## 2017-07-30 NOTE — Telephone Encounter (Signed)
Sent in patients medication. Thank you.

## 2017-07-30 NOTE — Telephone Encounter (Signed)
Pt needs refill sent to [facility] in [place], Was told by Dr. Army Melia at last visit to call and she would refill it for her. Please Advise.  apixaban (ELIQUIS) 5 MG TABS tablet [[id number]]

## 2017-08-03 DIAGNOSIS — M25562 Pain in left knee: Secondary | ICD-10-CM | POA: Diagnosis not present

## 2017-08-03 DIAGNOSIS — Z9889 Other specified postprocedural states: Secondary | ICD-10-CM | POA: Diagnosis not present

## 2017-08-19 ENCOUNTER — Encounter: Payer: Self-pay | Admitting: Emergency Medicine

## 2017-08-19 ENCOUNTER — Emergency Department
Admission: EM | Admit: 2017-08-19 | Discharge: 2017-08-19 | Disposition: A | Discharge status: Home / Self Care | Payer: Medicare HMO | Attending: Emergency Medicine | Admitting: Emergency Medicine

## 2017-08-19 DIAGNOSIS — J019 Acute sinusitis, unspecified: Secondary | ICD-10-CM | POA: Insufficient documentation

## 2017-08-19 DIAGNOSIS — Z79899 Other long term (current) drug therapy: Secondary | ICD-10-CM | POA: Diagnosis not present

## 2017-08-19 DIAGNOSIS — H9203 Otalgia, bilateral: Secondary | ICD-10-CM | POA: Diagnosis present

## 2017-08-19 DIAGNOSIS — B9689 Other specified bacterial agents as the cause of diseases classified elsewhere: Secondary | ICD-10-CM

## 2017-08-19 DIAGNOSIS — Z91040 Latex allergy status: Secondary | ICD-10-CM | POA: Diagnosis not present

## 2017-08-19 DIAGNOSIS — H65111 Acute and subacute allergic otitis media (mucoid) (sanguinous) (serous), right ear: Secondary | ICD-10-CM | POA: Diagnosis not present

## 2017-08-19 MED ORDER — DOXYCYCLINE HYCLATE 100 MG PO TABS: 100.0000 mg | ORAL_TABLET | Freq: Two times a day (BID) | ORAL | 0 refills | Status: AC

## 2017-08-19 MED ORDER — DOXYCYCLINE HYCLATE 100 MG PO TABS
100.0000 mg | ORAL_TABLET | Freq: Once | ORAL | Status: AC
  Administered 2017-08-19: 100 mg via ORAL
  Filled 2017-08-19: qty 1

## 2017-08-19 NOTE — ED Provider Notes (Signed)
[facility] Emergency Department Provider Note  ____________________________________________  Time seen: Approximately 6:25 PM  I have reviewed the triage vital signs and the nursing notes.   HISTORY  Chief Complaint Otalgia    HPI Ivette Arshia Spellman is a 51 y.o. female who presents emergency department complaining of sinus congestion, bilateral ear pain worse on the right and left, light cough.  Patient reports that she has had problems with her "sinuses" for the past week but developed ear pain over the last 2 days.  Patient reports that the pain in her right ear is sharp, constant.  No recent barotrauma.  Patient denies any drainage from the ears.  No tenderness to palpation to the external surface.  Patient denies any fevers or chills, nausea vomiting, abdominal pain, diarrhea or constipation.  Past Medical History:  Diagnosis Date  . Anxiety   . Arthritis    lower spine  . Chronic back pain   . Clinical depression 04/04/2012  . Depression   . Glaucoma    RIGHT EYE  . Headache(784.0)    migraines  . Ovarian cyst   . Pneumonia    walking pneumonia  . Sleep apnea    no cpcap     last sleep study > 4 yrs    Patient Active Problem List   Diagnosis Date Noted  . Perforated ear drum, left 06/25/2017  . Pulmonary embolism ([place]) 06/10/2017  . Environmental and seasonal allergies 04/07/2017  . Knee pain, left 04/07/2017  . Major depressive disorder with single episode, in partial remission ([place]) 04/07/2017  . Tinea versicolor 02/03/2017  . Vasomotor symptoms due to menopause 11/11/2016  . Status post bilateral salpingo-oophorectomy (BSO) 10/05/2016  . Morbid obesity ([place]) 09/10/2016  . Status post laparoscopic supracervical hysterectomy 09/10/2016  . Right lower quadrant pain 09/10/2016  . B12 nutritional deficiency 08/05/2016  . Obstructive sleep apnea of adult 03/04/2016  . Bilateral lumbar radiculopathy 08/14/2015  . S/P spinal surgery  08/14/2015  . Status post gastric bypass for obesity 07/04/2015  . Prediabetes 07/01/2015  . Spinal stenosis, lumbar region, with neurogenic claudication 11/11/2012  . Headache, migraine 04/04/2012    Past Surgical History:  Procedure Laterality Date  . ABDOMINAL HYSTERECTOMY  10/2006  . BACK SURGERY  2014   lumbar fusion  . BACK SURGERY  2009   discectomy  . BREAST REDUCTION SURGERY    . c sections    . GASTRIC BYPASS  2005  . KNEE ARTHROSCOPY WITH LATERAL MENISECTOMY Right 05/27/2017   Procedure: KNEE ARTHROSCOPY WITH LATERAL MENISECTOMY;  Surgeon: Hessie Knows, MD;  Location: [facility] ORS;  Service: Orthopedics;  Laterality: Right;  . KNEE ARTHROSCOPY WITH LATERAL RELEASE Right 05/27/2017   Procedure: KNEE ARTHROSCOPY WITH LATERAL RELEASE;  Surgeon: Hessie Knows, MD;  Location: [facility] ORS;  Service: Orthopedics;  Laterality: Right;  . LAPAROSCOPIC SALPINGO OOPHERECTOMY Bilateral 10/05/2016   Procedure: LAPAROSCOPIC SALPINGO OOPHORECTOMY;  Surgeon: Brayton Mars, MD;  Location: [facility] ORS;  Service: Gynecology;  Laterality: Bilateral;  . PELVIC LAPAROSCOPY    . REDUCTION MAMMAPLASTY Bilateral    2001  . SPINAL CORD STIMULATOR INSERTION N/A 03/20/2016   Procedure: LUMBAR SPINAL CORD STIMULATOR INSERTION;  Surgeon: Eustace Moore, MD;  Location: [place] [facility];  Service: Neurosurgery;  Laterality: N/A;  . TONSILLECTOMY      Prior to Admission medications   Medication Sig Start Date End Date Taking? Authorizing Provider  apixaban (ELIQUIS) 5 MG TABS tablet Take 1 tablet (5 mg total) by mouth  2 (two) times daily. 07/30/17   Glean Hess, MD  Cetirizine HCl 10 MG CAPS Take 1 capsule (10 mg total) by mouth daily. Patient taking differently: Take 10 mg by mouth at bedtime.  04/07/17   Glean Hess, MD  cyanocobalamin (,VITAMIN B-12,) 1000 MCG/ML injection Inject 1 mL (1,000 mcg total) into the muscle every 30 (thirty) days. 05/12/17   Glean Hess, MD  doxycycline (VIBRA-TABS)  100 MG tablet Take 1 tablet (100 mg total) by mouth 2 (two) times daily for 10 days. 08/19/17 08/29/17  Cuthriell, Charline Bills, PA-C  DULoxetine (CYMBALTA) 30 MG capsule Take 30 mg by mouth at bedtime.  02/05/16   [provider]  fluticasone (FLONASE) 50 MCG/ACT nasal spray Place 2 sprays into both nostrils daily. Patient taking differently: Place 2 sprays into both nostrils daily as needed for allergies.  04/22/16   Glean Hess, MD  gabapentin (NEURONTIN) 300 MG capsule Take 600 mg by mouth at bedtime.  01/01/16   [provider]  ketoconazole (NIZORAL) 2 % shampoo Apply 1 application topically 2 (two) times a week. Patient taking differently: Apply 1 application topically daily as needed (for skin fungus on neck/back).  02/08/17   Glean Hess, MD  latanoprost (XALATAN) 0.005 % ophthalmic solution Place 1 drop into both eyes at bedtime.     [provider]  rizatriptan (MAXALT-MLT) 10 MG disintegrating tablet DISSOLVE ONE TABLET BY MOUTH AS NEEDED FOR MIGRAINE. MAY REPEAT IN 2 HOURS IF NEEDED 07/02/17   Glean Hess, MD  SYRINGE-NEEDLE, DISP, 3 ML (B-D 3CC LUER-LOK SYR 25GX1") 25G X 1" 3 ML MISC 1 each by Does not apply route every 30 (thirty) days. 05/12/17   Glean Hess, MD  triamterene-hydrochlorothiazide (MAXZIDE-25) 37.5-25 MG tablet TAKE 1 TABLET BY MOUTH DAILY AS NEEDED Patient taking differently: Take 1 tablet by mouth daily as needed (for fluid retention/swelling in right leg or ankle).  05/02/17   Glean Hess, MD    Allergies Morphine and related; Penicillins; Latex; and Baclofen  Family History  Problem Relation Age of Onset  . Diabetes Mother   . Hypertension Mother   . Diabetes Sister   . Hypertension Father   . Hypertension Sister   . Breast cancer Neg Hx     Social History Social History   Tobacco Use  . Smoking status: Never Smoker  . Smokeless tobacco: Never Used  Substance Use Topics  . Alcohol use: No     Alcohol/week: 0.0 oz  . Drug use: No     Review of Systems  Constitutional: No fever/chills Eyes: No visual changes. No discharge ENT: Positive for bilateral ear pain, worse on right than left.  Positive for nasal congestion and sinus pressure. Cardiovascular: no chest pain. Respiratory: mild cough. No SOB. Gastrointestinal: No abdominal pain.  No nausea, no vomiting.  No diarrhea.  No constipation. Musculoskeletal: Negative for musculoskeletal pain. Skin: Negative for rash, abrasions, lacerations, ecchymosis. Neurological: Negative for headaches, focal weakness or numbness. 10-point ROS otherwise negative.  ____________________________________________   PHYSICAL EXAM:  VITAL SIGNS: ED Triage Vitals  Enc Vitals Group     BP 08/19/17 1736 (!) 151/78     Pulse Rate 08/19/17 1736 (!) 101     Resp 08/19/17 1736 16     Temp 08/19/17 1736 97.9 F (36.6 C)     Temp Source 08/19/17 1736 Oral     SpO2 08/19/17 1736 97 %     Weight 08/19/17 1733 (!)  335 lb (152 kg)     Height 08/19/17 1733 5\' 7"  (1.702 m)     Head Circumference --      Peak Flow --      Pain Score 08/19/17 1733 6     Pain Loc --      Pain Edu? --      Excl. in [place]? --      Constitutional: Alert and oriented. Well appearing and in no acute distress. Eyes: Conjunctivae are normal. PERRL. EOMI. Head: Atraumatic. ENT:      Ears: EACs unremarkable bilaterally.  TM on right is grossly bulging with significant mucoid levels.  TM on left is mildly bulging but otherwise unremarkable.      Nose: Moderate congestion/rhinnorhea.  Minutes are erythematous and edematous.  Tenderness to percussion over both frontal and maxillary sinuses.        Mouth/Throat: Mucous membranes are moist.  Her pharynx is mild erythematous but nonedematous.  Uvula is midline.  Tonsils are neither erythematous or edematous. Neck: No stridor. Neck is supple with full range of motion Hematological/Lymphatic/Immunilogical: Diffuse, mobile, nontender  anterior cervical lymphadenopathy. Cardiovascular: Normal rate, regular rhythm. Normal S1 and S2.  Good peripheral circulation. Respiratory: Normal respiratory effort without tachypnea or retractions. Lungs CTAB. Good air entry to the bases with no decreased or absent breath sounds. Musculoskeletal: Full range of motion to all extremities. No gross deformities appreciated. Neurologic:  Normal speech and language. No gross focal neurologic deficits are appreciated.  Skin:  Skin is warm, dry and intact. No rash noted. Psychiatric: Mood and affect are normal. Speech and behavior are normal. Patient exhibits appropriate insight and judgement.   ____________________________________________   LABS (all labs ordered are listed, but only abnormal results are displayed)  Labs Reviewed - No data to display ____________________________________________  EKG   ____________________________________________  RADIOLOGY   No results found.  ____________________________________________    PROCEDURES  Procedure(s) performed:    Procedures    Medications  doxycycline (VIBRA-TABS) tablet 100 mg (not administered)     ____________________________________________   INITIAL IMPRESSION / ASSESSMENT AND PLAN / ED COURSE  Pertinent labs & imaging results that were available during my care of the patient were reviewed by me and considered in my medical decision making (see chart for details).  Review of the [place] CSRS was performed in accordance of the [place] prior to dispensing any controlled drugs.     Patient's diagnosis is consistent with bacterial sinusitis and acute otitis media in the right ear.  Differential included sinusitis, otitis externa, otitis media, mastoiditis, strep, bronchitis, pneumonia.  Exam is consistent with significant otitis media in the right ear and a bacterial sinusitis.  Patient is highly allergic to penicillin based antibiotics and is unable to receive Augmentin,  cefdinir, cefoxitin, Rocephin.  As such, patient will be placed on 10-day course of doxycycline.  She is encouraged to continue use of her daily Zyrtec and Flonase.  Patient is given first dose of antibiotics in the emergency department.. Patient will be discharged home with prescriptions for doxycycline. Patient is to follow up with primary care as needed or otherwise directed. Patient is given ED precautions to return to the ED for any worsening or new symptoms.     ____________________________________________  FINAL CLINICAL IMPRESSION(S) / ED DIAGNOSES  Final diagnoses:  Acute mucoid otitis media of right ear  Acute bacterial sinusitis      NEW MEDICATIONS STARTED DURING THIS VISIT:  ED Discharge Orders  Ordered    doxycycline (VIBRA-TABS) 100 MG tablet  2 times daily     08/19/17 1833          This chart was dictated using voice recognition software/Dragon. Despite best efforts to proofread, errors can occur which can change the meaning. Any change was purely unintentional.    Darletta Moll, PA-C 08/19/17 1839    Lisa Roca, MD 08/19/17 [id number]

## 2017-08-19 NOTE — ED Triage Notes (Signed)
Pt to ed with c/o bilat earache, right worse than left since Tuesday.  +cough, denies fever.

## 2017-08-24 DIAGNOSIS — M48061 Spinal stenosis, lumbar region without neurogenic claudication: Secondary | ICD-10-CM | POA: Diagnosis not present

## 2017-08-24 DIAGNOSIS — G4733 Obstructive sleep apnea (adult) (pediatric): Secondary | ICD-10-CM | POA: Diagnosis not present

## 2017-08-24 DIAGNOSIS — F329 Major depressive disorder, single episode, unspecified: Secondary | ICD-10-CM | POA: Diagnosis not present

## 2017-08-24 DIAGNOSIS — M961 Postlaminectomy syndrome, not elsewhere classified: Secondary | ICD-10-CM | POA: Diagnosis not present

## 2017-08-31 DIAGNOSIS — M4800 Spinal stenosis, site unspecified: Secondary | ICD-10-CM | POA: Diagnosis not present

## 2017-08-31 DIAGNOSIS — G4733 Obstructive sleep apnea (adult) (pediatric): Secondary | ICD-10-CM | POA: Diagnosis not present

## 2017-09-09 NOTE — Progress Notes (Signed)
Error

## 2017-09-29 ENCOUNTER — Other Ambulatory Visit: Payer: Self-pay | Admitting: Internal Medicine

## 2017-09-29 DIAGNOSIS — J0100 Acute maxillary sinusitis, unspecified: Secondary | ICD-10-CM

## 2017-10-29 ENCOUNTER — Other Ambulatory Visit: Payer: Self-pay | Admitting: Internal Medicine

## 2017-10-29 DIAGNOSIS — R600 Localized edema: Secondary | ICD-10-CM

## 2017-11-06 ENCOUNTER — Other Ambulatory Visit: Payer: Self-pay | Admitting: Internal Medicine

## 2017-11-06 DIAGNOSIS — E538 Deficiency of other specified B group vitamins: Secondary | ICD-10-CM

## 2017-11-15 ENCOUNTER — Encounter: Payer: Self-pay | Admitting: Internal Medicine

## 2017-11-15 ENCOUNTER — Ambulatory Visit (INDEPENDENT_AMBULATORY_CARE_PROVIDER_SITE_OTHER): Payer: Medicare HMO | Admitting: Internal Medicine

## 2017-11-15 VITALS — BP 128/82 | HR 86 | Resp 16 | Ht 67.0 in | Wt 340.0 lb

## 2017-11-15 DIAGNOSIS — R609 Edema, unspecified: Secondary | ICD-10-CM | POA: Diagnosis not present

## 2017-11-15 DIAGNOSIS — Z23 Encounter for immunization: Secondary | ICD-10-CM | POA: Diagnosis not present

## 2017-11-15 DIAGNOSIS — R600 Localized edema: Secondary | ICD-10-CM | POA: Insufficient documentation

## 2017-11-15 DIAGNOSIS — B360 Pityriasis versicolor: Secondary | ICD-10-CM | POA: Diagnosis not present

## 2017-11-15 DIAGNOSIS — Z86711 Personal history of pulmonary embolism: Secondary | ICD-10-CM | POA: Diagnosis not present

## 2017-11-15 MED ORDER — KETOCONAZOLE 2 % EX SHAM: 1.0000 "application " | MEDICATED_SHAMPOO | Freq: Every day | CUTANEOUS | 1 refills | Status: DC | PRN

## 2017-11-15 NOTE — Progress Notes (Signed)
Date:  11/15/2017   Name:  Kimberly Cowan   DOB:  1966-03-19   MRN:  [id number]   Chief Complaint: Coagulation Disorder (has concerns about blood thinner. She is on 6 month of Eliquis and wants to know if she needs to stay on this or go see someone about what to do next. )  PE - pt had a PE from LLE DVT about 5 months ago after immobilization from surgery.  She has been treated with Eliquis bid since that time.  Since this was a provoked event, she should be able to complete 6 months of anticoagulation and then discontinue it.  If she has another DVT, she will need hematology workup and possibly life long therapy.  Edema - mild ankle edema controlled by diuretics.  Tinea versicolor - needs refill on ketoconazole shampoo.  Obesity - has had gastric bypass - lost 100 lbs but has gained back 40.  Trying to diet and exercise but not making any progress.   Review of Systems  Constitutional: Negative for chills, fatigue and unexpected weight change.  Eyes: Negative for visual disturbance.  Respiratory: Negative for chest tightness and shortness of breath.   Cardiovascular: Negative for chest pain and palpitations.  Gastrointestinal: Negative for abdominal pain, constipation and diarrhea.  Musculoskeletal: Positive for arthralgias.  Neurological: Negative for dizziness, speech difficulty and light-headedness.  Hematological: Negative for adenopathy. Does not bruise/bleed easily.    Patient Active Problem List   Diagnosis Date Noted  . Perforated ear drum, left 06/25/2017  . History of pulmonary embolism 06/10/2017  . Environmental and seasonal allergies 04/07/2017  . Major depressive disorder with single episode, in partial remission ([place]) 04/07/2017  . Tinea versicolor 02/03/2017  . Vasomotor symptoms due to menopause 11/11/2016  . Morbid obesity ([place]) 09/10/2016  . B12 nutritional deficiency 08/05/2016  . Obstructive sleep apnea of adult 03/04/2016  . Bilateral  lumbar radiculopathy 08/14/2015  . Status post gastric bypass for obesity 07/04/2015  . Prediabetes 07/01/2015  . Headache, migraine 04/04/2012    Prior to Admission medications   Medication Sig Start Date End Date Taking? Authorizing Provider  BD INTEGRA SYRINGE 25G X 1" 3 ML MISC USE AS DIRECTED 11/07/17   Glean Hess, MD  Cetirizine HCl 10 MG CAPS Take 1 capsule (10 mg total) by mouth daily. Patient taking differently: Take 10 mg by mouth at bedtime.  04/07/17   Glean Hess, MD  cyanocobalamin (,VITAMIN B-12,) 1000 MCG/ML injection Inject 1 mL (1,000 mcg total) into the muscle every 30 (thirty) days. 05/12/17   Glean Hess, MD  DULoxetine (CYMBALTA) 30 MG capsule Take 30 mg by mouth at bedtime.  02/05/16   [provider]  ELIQUIS 5 MG TABS tablet TAKE 1 TABLET(5 MG) BY MOUTH TWICE DAILY 10/29/17   Glean Hess, MD  fluticasone [facility]) 50 MCG/ACT nasal spray USE 2 SPRAYS IN [facility] NOSTRIL DAILY 09/29/17   Glean Hess, MD  gabapentin (NEURONTIN) 300 MG capsule Take 600 mg by mouth at bedtime.  01/01/16   [provider]  ketoconazole (NIZORAL) 2 % shampoo Apply 1 application topically 2 (two) times a week. Patient taking differently: Apply 1 application topically daily as needed (for skin fungus on neck/back).  02/08/17   Glean Hess, MD  latanoprost (XALATAN) 0.005 % ophthalmic solution Place 1 drop into both eyes at bedtime.     [provider]  rizatriptan (MAXALT-MLT) 10 MG disintegrating tablet DISSOLVE ONE TABLET BY MOUTH  AS NEEDED FOR MIGRAINE. MAY REPEAT IN 2 HOURS IF NEEDED 07/02/17   Glean Hess, MD  triamterene-hydrochlorothiazide (MAXZIDE-25) 37.5-25 MG tablet TAKE 1 TABLET BY MOUTH EVERY DAY AS NEEDED 10/29/17   Glean Hess, MD    Allergies  Allergen Reactions  . Morphine And Related Hives, Shortness Of Breath, Nausea And Vomiting and Other (See Comments)    "can't breathe"  . Penicillins Anaphylaxis, Hives,  Shortness Of Breath and Nausea And Vomiting    "can't breathe", Has patient had a PCN reaction causing immediate rash, facial/tongue/throat swelling, SOB or lightheadedness with hypotension: Yes Has patient had a PCN reaction causing severe rash involving mucus membranes or skin necrosis: No Has patient had a PCN reaction that required hospitalization No Has patient had a PCN reaction occurring within the last 10 years: No If all of the above answers are "NO", then may proceed with Cephalosporin use.   . Latex Hives  . Baclofen Nausea Only    Jittery, anxious    Past Surgical History:  Procedure Laterality Date  . ABDOMINAL HYSTERECTOMY  10/2006  . BACK SURGERY  2014   lumbar fusion  . BACK SURGERY  2009   discectomy  . GASTRIC BYPASS  2005  . KNEE ARTHROSCOPY WITH LATERAL MENISECTOMY Right 05/27/2017   Procedure: KNEE ARTHROSCOPY WITH LATERAL MENISECTOMY;  Surgeon: Hessie Knows, MD;  Location: [facility] ORS;  Service: Orthopedics;  Laterality: Right;  . KNEE ARTHROSCOPY WITH LATERAL RELEASE Right 05/27/2017   Procedure: KNEE ARTHROSCOPY WITH LATERAL RELEASE;  Surgeon: Hessie Knows, MD;  Location: [facility] ORS;  Service: Orthopedics;  Laterality: Right;  . LAPAROSCOPIC SALPINGO OOPHERECTOMY Bilateral 10/05/2016   Procedure: LAPAROSCOPIC SALPINGO OOPHORECTOMY;  Surgeon: Brayton Mars, MD;  Location: [facility] ORS;  Service: Gynecology;  Laterality: Bilateral;  . PELVIC LAPAROSCOPY    . REDUCTION MAMMAPLASTY Bilateral    2001  . SPINAL CORD STIMULATOR INSERTION N/A 03/20/2016   Procedure: LUMBAR SPINAL CORD STIMULATOR INSERTION;  Surgeon: Eustace Moore, MD;  Location: [place] NEURO ORS;  Service: Neurosurgery;  Laterality: N/A;  . TONSILLECTOMY      Social History   Tobacco Use  . Smoking status: Never Smoker  . Smokeless tobacco: Never Used  Substance Use Topics  . Alcohol use: No    Alcohol/week: 0.0 oz  . Drug use: No     Medication list has been reviewed and updated.  PHQ 2/9  Scores 06/25/2017 03/04/2016 08/14/2015 07/04/2015  PHQ - 2 Score - 0 0 0  Exception Documentation Medical reason - - -    Physical Exam  Constitutional: She is oriented to person, place, and time. She appears well-developed. No distress.  HENT:  Head: Normocephalic and atraumatic.  Cardiovascular: Normal rate, regular rhythm and normal heart sounds.  Pulmonary/Chest: Effort normal and breath sounds normal. No respiratory distress.  Musculoskeletal: Normal range of motion. She exhibits edema (trace ankle). She exhibits no tenderness.  Neurological: She is alert and oriented to person, place, and time.  Skin: Skin is warm and dry. No rash noted.  Psychiatric: She has a normal mood and affect. Her behavior is normal. Thought content normal.  Nursing note and vitals reviewed.   BP 128/82   Pulse 86   Resp 16   Ht 5\' 7"  (1.702 m)   Wt (!) 340 lb (154.2 kg)   SpO2 98%   BMI 53.25 kg/m   Assessment and Plan: 1. History of pulmonary embolism Complete one more month for a 6 month course of therapy  No further follow up or scans are needed  2. Tinea versicolor - ketoconazole (NIZORAL) 2 % shampoo; Apply 1 application topically daily as needed (for skin fungus on neck/back).  Dispense: 120 mL; Refill: 1  3. Need for influenza vaccination - Flu Vaccine QUAD 6+ mos PF IM (Fluarix Quad PF)  4. Mild peripheral edema Continue diuretics as needed  5. Morbid obesity ([place]) Information on Dr. Sammuel Hines seminar for intermittent fasting given   Meds ordered this encounter  Medications  . ketoconazole (NIZORAL) 2 % shampoo    Sig: Apply 1 application topically daily as needed (for skin fungus on neck/back).    Dispense:  120 mL    Refill:  1    Partially dictated using Editor, commissioning. Any errors are unintentional.  Halina Maidens, MD [facility]  11/15/2017

## 2017-11-15 NOTE — Patient Instructions (Addendum)
Take Eliquis for 30 more days then discontinue. No further scans or evaluation is needed unless symptoms recur.

## 2017-11-23 DIAGNOSIS — F1120 Opioid dependence, uncomplicated: Secondary | ICD-10-CM | POA: Diagnosis not present

## 2017-11-23 DIAGNOSIS — M48061 Spinal stenosis, lumbar region without neurogenic claudication: Secondary | ICD-10-CM | POA: Diagnosis not present

## 2017-11-23 DIAGNOSIS — I10 Essential (primary) hypertension: Secondary | ICD-10-CM | POA: Diagnosis not present

## 2017-11-23 DIAGNOSIS — G4733 Obstructive sleep apnea (adult) (pediatric): Secondary | ICD-10-CM | POA: Diagnosis not present

## 2017-12-21 ENCOUNTER — Other Ambulatory Visit: Payer: Self-pay

## 2017-12-21 ENCOUNTER — Encounter: Payer: Self-pay | Admitting: Emergency Medicine

## 2017-12-21 ENCOUNTER — Emergency Department: Payer: Medicare HMO

## 2017-12-21 ENCOUNTER — Emergency Department
Admission: EM | Admit: 2017-12-21 | Discharge: 2017-12-21 | Disposition: A | Discharge status: Home / Self Care | Payer: Medicare HMO | Attending: Emergency Medicine | Admitting: Emergency Medicine

## 2017-12-21 DIAGNOSIS — F419 Anxiety disorder, unspecified: Secondary | ICD-10-CM | POA: Insufficient documentation

## 2017-12-21 DIAGNOSIS — F329 Major depressive disorder, single episode, unspecified: Secondary | ICD-10-CM | POA: Insufficient documentation

## 2017-12-21 DIAGNOSIS — Z79899 Other long term (current) drug therapy: Secondary | ICD-10-CM | POA: Diagnosis not present

## 2017-12-21 DIAGNOSIS — Z91040 Latex allergy status: Secondary | ICD-10-CM | POA: Diagnosis not present

## 2017-12-21 DIAGNOSIS — Z7901 Long term (current) use of anticoagulants: Secondary | ICD-10-CM | POA: Insufficient documentation

## 2017-12-21 DIAGNOSIS — M79671 Pain in right foot: Secondary | ICD-10-CM

## 2017-12-21 DIAGNOSIS — M722 Plantar fascial fibromatosis: Secondary | ICD-10-CM | POA: Insufficient documentation

## 2017-12-21 DIAGNOSIS — Z9884 Bariatric surgery status: Secondary | ICD-10-CM | POA: Insufficient documentation

## 2017-12-21 NOTE — ED Notes (Signed)
Pt helped into wheelchair, son pulled up POV. VSS. NAD. Discharge instructions, and follow up discussed. All questions answered.

## 2017-12-21 NOTE — ED Notes (Signed)
See triage note  Woke up with pain to right ankle and across foot  Denies any injury  No deformity noted

## 2017-12-21 NOTE — ED Triage Notes (Signed)
Says woke up with pain right outer ankle and goes into foot.  Denies any recent injury.  jpain increased with weight bearing

## 2017-12-21 NOTE — Discharge Instructions (Signed)
Your exam is consistent with plantar fasciitis to the right foot. Take your home meds as directed. Rest and elevate the foot when seated. Consider using warm epsom salt soaks or ice therapy to the sole of the foot. Follow-up with Dr. Cleda Mccreedy for further management.

## 2017-12-21 NOTE — ED Provider Notes (Signed)
[facility] Emergency Department Provider Note ____________________________________________  Time seen: 1110  I have reviewed the triage vital signs and the nursing notes.  HISTORY  Chief Complaint  Ankle Pain and Foot Pain  HPI Maysie Charne Mcbrien is a 52 y.o. female presents to the ED accompanied by family, for evaluation of sudden onset of right foot pain.  Patient describes onset this morning at about 5 AM.  She describes pain with attempts to put the foot down and bear weight.  She is unaware of any recent injury, accident, trauma, or falls.  She reports a history of plantar fasciitis on the left foot that has been previously evaluated by podiatry.  She refused any treatment at that time that was offered which included cortisone injections.  She reports no significant symptoms in the interim.  She presents today for sudden pain that she localizes to the plantar surface of her right foot.  She also notes some pain to the posterior heel at the Achilles.  She also localizes some pain across the dorsal aspect of the foot at the talus.  She has not taken any medications in the interim for symptom relief.  Denies any chest pain, shortness of breath, or foot drop.  Past Medical History:  Diagnosis Date  . Anxiety   . Arthritis    lower spine  . Chronic back pain   . Clinical depression 04/04/2012  . Depression   . Glaucoma    RIGHT EYE  . Headache(784.0)    migraines  . Ovarian cyst   . Pneumonia    walking pneumonia  . Sleep apnea    no cpcap     last sleep study > 4 yrs    Patient Active Problem List   Diagnosis Date Noted  . Mild peripheral edema 11/15/2017  . Perforated ear drum, left 06/25/2017  . History of pulmonary embolism 06/10/2017  . Environmental and seasonal allergies 04/07/2017  . Major depressive disorder with single episode, in partial remission ([place]) 04/07/2017  . Tinea versicolor 02/03/2017  . Vasomotor symptoms due to menopause  11/11/2016  . Morbid obesity ([place]) 09/10/2016  . B12 nutritional deficiency 08/05/2016  . Obstructive sleep apnea of adult 03/04/2016  . Bilateral lumbar radiculopathy 08/14/2015  . Status post gastric bypass for obesity 07/04/2015  . Prediabetes 07/01/2015  . Headache, migraine 04/04/2012    Past Surgical History:  Procedure Laterality Date  . ABDOMINAL HYSTERECTOMY  10/2006  . BACK SURGERY  2014   lumbar fusion  . BACK SURGERY  2009   discectomy  . GASTRIC BYPASS  2005  . KNEE ARTHROSCOPY WITH LATERAL MENISECTOMY Right 05/27/2017   Procedure: KNEE ARTHROSCOPY WITH LATERAL MENISECTOMY;  Surgeon: Hessie Knows, MD;  Location: [facility] ORS;  Service: Orthopedics;  Laterality: Right;  . KNEE ARTHROSCOPY WITH LATERAL RELEASE Right 05/27/2017   Procedure: KNEE ARTHROSCOPY WITH LATERAL RELEASE;  Surgeon: Hessie Knows, MD;  Location: [facility] ORS;  Service: Orthopedics;  Laterality: Right;  . LAPAROSCOPIC SALPINGO OOPHERECTOMY Bilateral 10/05/2016   Procedure: LAPAROSCOPIC SALPINGO OOPHORECTOMY;  Surgeon: Brayton Mars, MD;  Location: [facility] ORS;  Service: Gynecology;  Laterality: Bilateral;  . PELVIC LAPAROSCOPY    . REDUCTION MAMMAPLASTY Bilateral    2001  . SPINAL CORD STIMULATOR INSERTION N/A 03/20/2016   Procedure: LUMBAR SPINAL CORD STIMULATOR INSERTION;  Surgeon: Eustace Moore, MD;  Location: [place] NEURO ORS;  Service: Neurosurgery;  Laterality: N/A;  . TONSILLECTOMY      Prior to Admission medications   Medication  Sig Start Date End Date Taking? Authorizing Provider  BD INTEGRA SYRINGE 25G X 1" 3 ML MISC USE AS DIRECTED 11/07/17   Glean Hess, MD  Cetirizine HCl 10 MG CAPS Take 1 capsule (10 mg total) by mouth daily. Patient taking differently: Take 10 mg by mouth at bedtime.  04/07/17   Glean Hess, MD  cyanocobalamin (,VITAMIN B-12,) 1000 MCG/ML injection Inject 1 mL (1,000 mcg total) into the muscle every 30 (thirty) days. 05/12/17   Glean Hess, MD  ELIQUIS 5 MG TABS  tablet TAKE 1 TABLET(5 MG) BY MOUTH TWICE DAILY 10/29/17   Glean Hess, MD  fluticasone [facility]) 50 MCG/ACT nasal spray USE 2 SPRAYS IN [employer] NOSTRIL DAILY 09/29/17   Glean Hess, MD  gabapentin (NEURONTIN) 300 MG capsule Take 600 mg by mouth at bedtime.  01/01/16   [provider]  ketoconazole (NIZORAL) 2 % shampoo Apply 1 application topically daily as needed (for skin fungus on neck/back). 11/15/17   Glean Hess, MD  latanoprost (XALATAN) 0.005 % ophthalmic solution Place 1 drop into both eyes at bedtime.     [provider]  rizatriptan (MAXALT-MLT) 10 MG disintegrating tablet DISSOLVE ONE TABLET BY MOUTH AS NEEDED FOR MIGRAINE. MAY REPEAT IN 2 HOURS IF NEEDED 07/02/17   Glean Hess, MD  triamterene-hydrochlorothiazide (MVHQION-62) 37.5-25 MG tablet TAKE 1 TABLET BY MOUTH EVERY DAY AS NEEDED 10/29/17   Glean Hess, MD    Allergies Morphine and related; Penicillins; Latex; and Baclofen  Family History  Problem Relation Age of Onset  . Diabetes Mother   . Hypertension Mother   . Diabetes Sister   . Hypertension Father   . Hypertension Sister   . Breast cancer Neg Hx     Social History Social History   Tobacco Use  . Smoking status: Never Smoker  . Smokeless tobacco: Never Used  Substance Use Topics  . Alcohol use: No    Alcohol/week: 0.0 oz  . Drug use: No    Review of Systems  Constitutional: Negative for fever. Cardiovascular: Negative for chest pain. Respiratory: Negative for shortness of breath. Musculoskeletal: Negative for back pain.  Right foot and ankle pain as above. Skin: Negative for rash. Neurological: Negative for headaches, focal weakness or numbness. ____________________________________________  PHYSICAL EXAM:  VITAL SIGNS: ED Triage Vitals  Enc Vitals Group     BP 12/21/17 1043 139/70     Pulse Rate 12/21/17 1043 87     Resp 12/21/17 1043 16     Temp 12/21/17 1043 98.5 F (36.9 C)     Temp Source 12/21/17  1043 Oral     SpO2 12/21/17 1043 96 %     Weight 12/21/17 1044 (!) 337 lb (152.9 kg)     Height 12/21/17 1044 5\' 7"  (1.702 m)     Head Circumference --      Peak Flow --      Pain Score 12/21/17 1044 6     Pain Loc --      Pain Edu? --      Excl. in [place]? --     Constitutional: Alert and oriented. Well appearing and in no distress. Head: Normocephalic and atraumatic. Cardiovascular: Normal rate, regular rhythm. Normal distal pulses.  Normal capillary refill. Respiratory: Normal respiratory effort.  Musculoskeletal: Foot and ankle without obvious deformity, dislocation, or effusion.  Patient with exquisite tenderness to palpation to the medial and lateral aspects of the ankle.  She also tender across the  dorsal ankle the talar junction.  She is tender to palpation across the Achilles as well as at the Achilles insertion.  Compression of the heel also elicits significant pain response.  Patient is tender to palpation along the plantar fascia on the instep towards the MTPs.  Compression across the toes also elicits pain to the foot.  Patient is able to demonstrate normal foot dorsiflexion and plantar flexion.  Nontender with normal range of motion in all extremities.  Neurologic: Normal LE DTRs bilaterally.  Antalgic gait without ataxia. Normal gross sensation.  No gross focal neurologic deficits are appreciated. Skin:  Skin is warm, dry and intact. No rash noted. ____________________________________________   RADIOLOGY  Right Foot Negative ____________________________________________  PROCEDURES  Procedures Ace bandage ____________________________________________  INITIAL IMPRESSION / ASSESSMENT AND PLAN / ED COURSE  Patient with ED evaluation of sudden right foot pain presenting for evaluation.  Patient's x-ray is negative for any acute findings.  Her exam is likely consistent with a plantar fasciitis.  She is to follow-up with podiatry for ongoing management.  She will be placed in  an Ace bandage for comfort.  She will take her home medications which include oxycodone as prescribed.  She will follow-up as directed.  I reviewed the patient's prescription history over the last 12 months in the multi-state controlled substances database(s) that includes [place], [place], [place], [place], [place], [place], [place], [place], [place], [place], [place], [place], [place], and [place].  Results were notable for recent narcotic prescription filled 12/15/17. ____________________________________________  FINAL CLINICAL IMPRESSION(S) / ED DIAGNOSES  Final diagnoses:  Plantar fasciitis of right foot      Melvenia Needles, PA-C 12/21/17 1244    Nena Polio, MD 12/21/17 [phone]

## 2017-12-30 ENCOUNTER — Telehealth: Payer: Self-pay | Admitting: Internal Medicine

## 2017-12-30 NOTE — Telephone Encounter (Signed)
Called to schedule AWV with Nurse Health Advisor. Last AWV on 10/6 /16 please schedule AWV with NHA any date Thank you! For questions please contact: Jill Alexanders [phone]  Skype [email or web address]

## 2018-01-04 ENCOUNTER — Encounter: Payer: Self-pay | Admitting: Internal Medicine

## 2018-01-04 ENCOUNTER — Ambulatory Visit (INDEPENDENT_AMBULATORY_CARE_PROVIDER_SITE_OTHER): Payer: Medicare HMO | Admitting: Internal Medicine

## 2018-01-04 VITALS — BP 138/80 | HR 97 | Ht 67.0 in | Wt 336.0 lb

## 2018-01-04 DIAGNOSIS — Z86711 Personal history of pulmonary embolism: Secondary | ICD-10-CM

## 2018-01-04 DIAGNOSIS — R7303 Prediabetes: Secondary | ICD-10-CM

## 2018-01-04 DIAGNOSIS — M5416 Radiculopathy, lumbar region: Secondary | ICD-10-CM

## 2018-01-04 DIAGNOSIS — Z0000 Encounter for general adult medical examination without abnormal findings: Secondary | ICD-10-CM | POA: Diagnosis not present

## 2018-01-04 DIAGNOSIS — Z0001 Encounter for general adult medical examination with abnormal findings: Secondary | ICD-10-CM | POA: Diagnosis not present

## 2018-01-04 DIAGNOSIS — G4733 Obstructive sleep apnea (adult) (pediatric): Secondary | ICD-10-CM | POA: Diagnosis not present

## 2018-01-04 DIAGNOSIS — E538 Deficiency of other specified B group vitamins: Secondary | ICD-10-CM | POA: Diagnosis not present

## 2018-01-04 DIAGNOSIS — Z1239 Encounter for other screening for malignant neoplasm of breast: Secondary | ICD-10-CM

## 2018-01-04 LAB — POCT URINALYSIS DIPSTICK
Bilirubin, UA: NEGATIVE
Blood, UA: NEGATIVE
Glucose, UA: NEGATIVE
Ketones, UA: NEGATIVE
Leukocytes, UA: NEGATIVE
Nitrite, UA: NEGATIVE
Protein, UA: NEGATIVE
Spec Grav, UA: 1.025
Urobilinogen, UA: 0.2 U/dL
pH, UA: 6.5

## 2018-01-04 NOTE — Progress Notes (Signed)
Date:  01/04/2018   Name:  Kimberly Cowan   DOB:  [birth year]   MRN:  [id number]   Chief Complaint: Annual Exam (Breast Exam. ) Kimberly Cowan is a 52 y.o. female who presents today for her Complete Annual Exam. She feels fairly well. She reports exercising none. She reports she is sleeping poorly - no using CPAP. Mammogram is due.  Colonoscopy due later this year.  Diabetes  She presents for her follow-up diabetic visit. Diabetes type: prediabetes. Her disease course has been stable. Hypoglycemia symptoms include headaches. Pertinent negatives for hypoglycemia include no dizziness, nervousness/anxiousness or tremors. Pertinent negatives for diabetes include no chest pain, no fatigue, no polydipsia and no polyuria. Her weight is stable.  Migraine   This is a chronic problem. The problem occurs seasonly. The problem has been unchanged. Pertinent negatives include no abdominal pain, coughing, dizziness, fever, hearing loss, tinnitus or vomiting.   OSA - not using her CPAP regularly - she is annoyed by it due to nasal dryness with the nasal pillows.  She has tried nasal spray.   Review of Systems  Constitutional: Negative for chills, fatigue and fever.  HENT: Negative for congestion, hearing loss, tinnitus, trouble swallowing and voice change.   Eyes: Negative for visual disturbance.  Respiratory: Negative for cough, chest tightness, shortness of breath and wheezing.   Cardiovascular: Negative for chest pain, palpitations and leg swelling.  Gastrointestinal: Negative for abdominal pain, constipation, diarrhea and vomiting.  Endocrine: Negative for polydipsia and polyuria.  Genitourinary: Negative for dysuria, frequency, genital sores, vaginal bleeding and vaginal discharge.  Musculoskeletal: Negative for arthralgias, gait problem and joint swelling.  Skin: Negative for color change and rash.  Neurological: Positive for headaches. Negative for dizziness, tremors  and light-headedness.  Hematological: Negative for adenopathy. Does not bruise/bleed easily.  Psychiatric/Behavioral: Negative for dysphoric mood and sleep disturbance. The patient is not nervous/anxious.     Patient Active Problem List   Diagnosis Date Noted  . Mild peripheral edema 11/15/2017  . Perforated ear drum, left 06/25/2017  . History of pulmonary embolism 06/10/2017  . Environmental and seasonal allergies 04/07/2017  . Major depressive disorder with single episode, in partial remission ([place]) 04/07/2017  . Tinea versicolor 02/03/2017  . Vasomotor symptoms due to menopause 11/11/2016  . Morbid obesity ([place]) 09/10/2016  . B12 nutritional deficiency 08/05/2016  . Obstructive sleep apnea of adult 03/04/2016  . Bilateral lumbar radiculopathy 08/14/2015  . Status post gastric bypass for obesity 07/04/2015  . Prediabetes 07/01/2015  . Headache, migraine 04/04/2012    Prior to Admission medications   Medication Sig Start Date End Date Taking? Authorizing Provider  BD INTEGRA SYRINGE 25G X 1" 3 ML MISC USE AS DIRECTED 11/07/17  Yes Kimberly Hess, MD  Cetirizine HCl 10 MG CAPS Take 1 capsule (10 mg total) by mouth daily. Patient taking differently: Take 10 mg by mouth at bedtime.  04/07/17  Yes Kimberly Hess, MD  cyclobenzaprine (FLEXERIL) 10 MG tablet Take 1 tablet by mouth 3 (three) times daily. 12/28/17  Yes [provider]  fluticasone (FLONASE) 50 MCG/ACT nasal spray USE 2 SPRAYS IN EACH NOSTRIL DAILY 09/29/17  Yes Kimberly Hess, MD  gabapentin (NEURONTIN) 300 MG capsule Take 600 mg by mouth at bedtime.  01/01/16  Yes [provider]  ketoconazole (NIZORAL) 2 % shampoo Apply 1 application topically daily as needed (for skin fungus on neck/back). 11/15/17  Yes Kimberly Hess, MD  latanoprost (XALATAN) 0.005 % ophthalmic  solution Place 1 drop into both eyes at bedtime.    Yes [provider]  oxyCODONE-acetaminophen (PERCOCET/ROXICET) 5-325 MG  tablet Take 1 tablet by mouth daily. 12/15/17  Yes [provider]  rizatriptan (MAXALT-MLT) 10 MG disintegrating tablet DISSOLVE ONE TABLET BY MOUTH AS NEEDED FOR MIGRAINE. MAY REPEAT IN 2 HOURS IF NEEDED 07/02/17  Yes Kimberly Hess, MD  triamterene-hydrochlorothiazide (QMVHQIO-96) 37.5-25 MG tablet TAKE 1 TABLET BY MOUTH EVERY DAY AS NEEDED 10/29/17  Yes Kimberly Hess, MD    Allergies  Allergen Reactions  . Morphine And Related Hives, Shortness Of Breath, Nausea And Vomiting and Other (See Comments)    "can't breathe"  . Penicillins Anaphylaxis, Hives, Shortness Of Breath and Nausea And Vomiting    "can't breathe", Has patient had a PCN reaction causing immediate rash, facial/tongue/throat swelling, SOB or lightheadedness with hypotension: Yes Has patient had a PCN reaction causing severe rash involving mucus membranes or skin necrosis: No Has patient had a PCN reaction that required hospitalization No Has patient had a PCN reaction occurring within the last 10 years: No If all of the above answers are "NO", then may proceed with Cephalosporin use.   . Latex Hives  . Baclofen Nausea Only    Jittery, anxious    Past Surgical History:  Procedure Laterality Date  . ABDOMINAL HYSTERECTOMY  10/2006  . BACK SURGERY  2014   lumbar fusion  . BACK SURGERY  2009   discectomy  . GASTRIC BYPASS  2005  . KNEE ARTHROSCOPY WITH LATERAL MENISECTOMY Right 05/27/2017   Procedure: KNEE ARTHROSCOPY WITH LATERAL MENISECTOMY;  Surgeon: Kimberly Knows, MD;  Location: [facility];  Service: Orthopedics;  Laterality: Right;  . KNEE ARTHROSCOPY WITH LATERAL RELEASE Right 05/27/2017   Procedure: KNEE ARTHROSCOPY WITH LATERAL RELEASE;  Surgeon: Kimberly Knows, MD;  Location: [facility];  Service: Orthopedics;  Laterality: Right;  . LAPAROSCOPIC SALPINGO OOPHERECTOMY Bilateral 10/05/2016   Procedure: LAPAROSCOPIC SALPINGO OOPHORECTOMY;  Surgeon: Kimberly Mars, MD;  Location: [facility] OR[facility];  Service:  Gynecology;  Laterality: Bilateral;  . PELVIC LAPAROSCOPY    . REDUCTION MAMMAPLASTY Bilateral    2001  . SPINAL CORD STIMULATOR INSERTION N/A 03/20/2016   Procedure: LUMBAR SPINAL CORD STIMULATOR INSERTION;  Surgeon: Kimberly Moore, MD;  Location: [place] [facility];  Service: Neurosurgery;  Laterality: N/A;  . TONSILLECTOMY      Social History   Tobacco Use  . Smoking status: Never Smoker  . Smokeless tobacco: Never Used  Substance Use Topics  . Alcohol use: No    Alcohol/week: 0.0 oz  . Drug use: No     Medication list has been reviewed and updated.  PHQ 2/9 Scores 01/04/2018 06/25/2017 03/04/2016 08/14/2015  PHQ - 2 Score 0 - 0 0  PHQ- 9 Score 0 - - -  Exception Documentation - Medical reason - -    Physical Exam  Constitutional: She is oriented to person, place, and time. She appears well-developed and well-nourished. No distress.  HENT:  Head: Normocephalic and atraumatic.  Right Ear: Tympanic membrane and ear canal normal.  Left Ear: Tympanic membrane and ear canal normal.  Nose: Right sinus exhibits no maxillary sinus tenderness. Left sinus exhibits no maxillary sinus tenderness.  Mouth/Throat: Uvula is midline and oropharynx is clear and moist.  Eyes: Conjunctivae and EOM are normal. Right eye exhibits no discharge. Left eye exhibits no discharge. No scleral icterus.  Neck: Normal range of motion. Carotid bruit is not present. No erythema present. No thyromegaly  present.  Cardiovascular: Normal rate, regular rhythm, normal heart sounds and normal pulses.  Pulmonary/Chest: Effort normal. No respiratory distress. She has no wheezes. Right breast exhibits skin change. Right breast exhibits no mass, no nipple discharge and no tenderness. Left breast exhibits skin change. Left breast exhibits no mass, no nipple discharge and no tenderness.  S/p breast reduction surgery bilaterally - scars present  Abdominal: Soft. Bowel sounds are normal. She exhibits distension. There is no  hepatosplenomegaly. There is no tenderness. There is no CVA tenderness.  Lymphadenopathy:    She has no cervical adenopathy.    She has no axillary adenopathy.  Neurological: She is alert and oriented to person, place, and time. She has normal strength and normal reflexes. No cranial nerve deficit or sensory deficit. Gait normal.  Skin: Skin is warm, dry and intact. No rash noted.  Psychiatric: She has a normal mood and affect. Her speech is normal and behavior is normal. Thought content normal.  Nursing note and vitals reviewed.   BP 138/80   Pulse 97   Ht 5\' 7"  (1.702 m)   Wt (!) 336 lb (152.4 kg)   SpO2 97%   BMI 52.63 kg/m   Assessment and Plan: 1. Annual physical exam Work on weight loss diet and exercise - CBC with Differential/Platelet - Lipid panel - POCT urinalysis dipstick  2. Breast cancer screening Schedule at [place]; Future  3. Prediabetes Stressed diet; may benefit from meter if insurance would cover - Comprehensive metabolic panel - Hemoglobin A1c - TSH  4. History of pulmonary embolism Completed 6 mo of Eliquis  5. Bilateral lumbar radiculopathy stable  6. Obstructive sleep apnea of adult Encouraged pt to follow up with sleep center for appliance that she will tolerate   No orders of the defined types were placed in this encounter.   Partially dictated using Editor, commissioning. Any errors are unintentional.  Halina Maidens, MD [facility]  01/04/2018

## 2018-01-04 NOTE — Patient Instructions (Addendum)
Call [facility] about CPAP supplies and a better mask, etc. Call to schedule mammogram at [place] being familiar with how your breasts look and feel. It involves checking your breasts regularly and reporting any changes to your health care provider. Practicing breast self-awareness is important. A change in your breasts can be a sign of a serious medical problem. Being familiar with how your breasts look and feel allows you to find any problems early, when treatment is more likely to be successful. All women should practice breast self-awareness, including women who have had breast implants. How to do a breast self-exam One way to learn what is normal for your breasts and whether your breasts are changing is to do a breast self-exam. To do a breast self-exam: Look for Changes  1. Remove all the clothing above your waist. 2. Stand in front of a mirror in a room with good lighting. 3. Put your hands on your hips. 4. Push your hands firmly downward. 5. Compare your breasts in the mirror. Look for differences between them (asymmetry), such as: ? Differences in shape. ? Differences in size. ? Puckers, dips, and bumps in one breast and not the other. 6. Look at each breast for changes in your skin, such as: ? Redness. ? Scaly areas. 7. Look for changes in your nipples, such as: ? Discharge. ? Bleeding. ? Dimpling. ? Redness. ? A change in position. Feel for Changes  Carefully feel your breasts for lumps and changes. It is best to do this while lying on your back on the floor and again while sitting or standing in the shower or tub with soapy water on your skin. Feel each breast in the following way:  Place the arm on the side of the breast you are examining above your head.  Feel your breast with the other hand.  Start in the nipple area and make  inch (2 cm) overlapping circles to feel your breast. Use the pads of  your three middle fingers to do this. Apply light pressure, then medium pressure, then firm pressure. The light pressure will allow you to feel the tissue closest to the skin. The medium pressure will allow you to feel the tissue that is a little deeper. The firm pressure will allow you to feel the tissue close to the ribs.  Continue the overlapping circles, moving downward over the breast until you feel your ribs below your breast.  Move one finger-width toward the center of the body. Continue to use the  inch (2 cm) overlapping circles to feel your breast as you move slowly up toward your collarbone.  Continue the up and down exam using all three pressures until you reach your armpit.  Write Down What You Find  Write down what is normal for each breast and any changes that you find. Keep a written record with breast changes or normal findings for each breast. By writing this information down, you do not need to depend only on memory for size, tenderness, or location. Write down where you are in your menstrual cycle, if you are still menstruating. If you are having trouble noticing differences in your breasts, do not get discouraged. With time you will become more familiar with the variations in your breasts and more comfortable with the exam. How often should I examine my breasts? Examine your breasts every month. If you are breastfeeding, the best time to examine your breasts is after a feeding  or after using a breast pump. If you menstruate, the best time to examine your breasts is 5-7 days after your period is over. During your period, your breasts are lumpier, and it may be more difficult to notice changes. When should I see my health care provider? See your health care provider if you notice:  A change in shape or size of your breasts or nipples.  A change in the skin of your breast or nipples, such as a reddened or scaly area.  Unusual discharge from your nipples.  A lump or thick  area that was not there before.  Pain in your breasts.  Anything that concerns you.  This information is not intended to replace advice given to you by your health care provider. Make sure you discuss any questions you have with your health care provider. Document Released: 09/14/2005 Document Revised: 02/20/2016 Document Reviewed: 08/04/2015 Elsevier Interactive Patient Education  [employer].

## 2018-01-05 LAB — CBC WITH DIFFERENTIAL/PLATELET
BASOS: 0 %
Basophils Absolute: 0 10*3/uL (ref 0.0–0.2)
EOS (ABSOLUTE): 0.1 10*3/uL (ref 0.0–0.4)
Eos: 3 %
HEMOGLOBIN: 11.2 g/dL (ref 11.1–15.9)
Hematocrit: 37.2 % (ref 34.0–46.6)
IMMATURE GRANULOCYTES: 0 %
Immature Grans (Abs): 0 10*3/uL (ref 0.0–0.1)
Lymphocytes Absolute: 2.3 10*3/uL (ref 0.7–3.1)
Lymphs: 49 %
MCH: 24.9 pg — ABNORMAL LOW (ref 26.6–33.0)
MCHC: 30.1 g/dL — ABNORMAL LOW (ref 31.5–35.7)
MCV: 83 fL (ref 79–97)
MONOCYTES: 7 %
MONOS ABS: 0.3 10*3/uL (ref 0.1–0.9)
NEUTROS PCT: 41 %
Neutrophils Absolute: 1.9 10*3/uL (ref 1.4–7.0)
Platelets: 315 10*3/uL (ref 150–379)
RBC: 4.5 x10E6/uL (ref 3.77–5.28)
RDW: 16.4 % — ABNORMAL HIGH (ref 12.3–15.4)
WBC: 4.7 10*3/uL (ref 3.4–10.8)

## 2018-01-05 LAB — COMPREHENSIVE METABOLIC PANEL
A/G RATIO: 1.2 (ref 1.2–2.2)
ALBUMIN: 4 g/dL (ref 3.5–5.5)
ALT: 15 IU/L (ref 0–32)
AST: 16 IU/L (ref 0–40)
Alkaline Phosphatase: 102 IU/L (ref 39–117)
BUN / CREAT RATIO: 11 (ref 9–23)
BUN: 7 mg/dL (ref 6–24)
Bilirubin Total: 0.3 mg/dL (ref 0.0–1.2)
CALCIUM: 9.7 mg/dL (ref 8.7–10.2)
CO2: 25 mmol/L (ref 20–29)
CREATININE: 0.63 mg/dL (ref 0.57–1.00)
Chloride: 104 mmol/L (ref 96–106)
GFR, EST AFRICAN AMERICAN: 122 mL/min/{1.73_m2} (ref >59)
GFR, EST NON AFRICAN AMERICAN: 106 mL/min/{1.73_m2} (ref >59)
GLOBULIN, TOTAL: 3.3 g/dL (ref 1.5–4.5)
Glucose: 93 mg/dL (ref 65–99)
POTASSIUM: 4 mmol/L (ref 3.5–5.2)
SODIUM: 142 mmol/L (ref 134–144)
TOTAL PROTEIN: 7.3 g/dL (ref 6.0–8.5)

## 2018-01-05 LAB — TSH: TSH: 0.942 u[IU]/mL (ref 0.450–4.500)

## 2018-01-05 LAB — LIPID PANEL
CHOL/HDL RATIO: 2.3 ratio (ref 0.0–4.4)
Cholesterol, Total: 142 mg/dL (ref 100–199)
HDL: 62 mg/dL (ref >39)
LDL CALC: 64 mg/dL (ref 0–99)
Triglycerides: 82 mg/dL (ref 0–149)
VLDL Cholesterol Cal: 16 mg/dL (ref 5–40)

## 2018-01-05 LAB — HEMOGLOBIN A1C
ESTIMATED AVERAGE GLUCOSE: 134 mg/dL
HEMOGLOBIN A1C: 6.3 % — AB (ref 4.8–5.6)

## 2018-01-07 DIAGNOSIS — M4800 Spinal stenosis, site unspecified: Secondary | ICD-10-CM | POA: Diagnosis not present

## 2018-01-07 DIAGNOSIS — G4733 Obstructive sleep apnea (adult) (pediatric): Secondary | ICD-10-CM | POA: Diagnosis not present

## 2018-01-17 ENCOUNTER — Ambulatory Visit
Admission: RE | Admit: 2018-01-17 | Discharge: 2018-01-17 | Disposition: A | Discharge status: Home / Self Care | Payer: Medicare HMO | Source: Ambulatory Visit | Attending: Internal Medicine | Admitting: Internal Medicine

## 2018-01-17 ENCOUNTER — Ambulatory Visit (INDEPENDENT_AMBULATORY_CARE_PROVIDER_SITE_OTHER): Payer: Medicare HMO | Admitting: Internal Medicine

## 2018-01-17 ENCOUNTER — Encounter: Payer: Self-pay | Admitting: Internal Medicine

## 2018-01-17 VITALS — BP 128/78 | HR 98 | Ht 67.0 in | Wt 336.0 lb

## 2018-01-17 DIAGNOSIS — R296 Repeated falls: Secondary | ICD-10-CM | POA: Diagnosis not present

## 2018-01-17 DIAGNOSIS — M25562 Pain in left knee: Secondary | ICD-10-CM | POA: Diagnosis not present

## 2018-01-17 DIAGNOSIS — M25561 Pain in right knee: Secondary | ICD-10-CM

## 2018-01-17 DIAGNOSIS — M11261 Other chondrocalcinosis, right knee: Secondary | ICD-10-CM | POA: Insufficient documentation

## 2018-01-17 DIAGNOSIS — M170 Bilateral primary osteoarthritis of knee: Secondary | ICD-10-CM | POA: Insufficient documentation

## 2018-01-17 DIAGNOSIS — M2341 Loose body in knee, right knee: Secondary | ICD-10-CM | POA: Diagnosis not present

## 2018-01-17 DIAGNOSIS — M2342 Loose body in knee, left knee: Secondary | ICD-10-CM | POA: Insufficient documentation

## 2018-01-17 NOTE — Progress Notes (Signed)
Date:  01/17/2018   Name:  Kimberly Cowan   DOB:  1966/08/04   MRN:  [id number]   Chief Complaint: [place] on Thursday and Saturday. Was just walking and fell onto both legs each time. Had no chest pain, dizziness, or SOB. She just fell and felt like knees popped. Legs have been sore from bruising and hurting since fall. )  Fall  The accident occurred 3 to 5 days ago. The fall occurred while walking. She landed on grass. The point of impact was the left knee and right knee. The pain is present in the left knee and right knee. The pain is moderate. The symptoms are aggravated by ambulation, extension and pressure on injury. Pertinent negatives include no fever or headaches. Treatments tried: percocet and rest. The treatment provided mild relief.  Knee Pain   The incident occurred 3 to 5 days ago. The injury mechanism was a fall.  Bruises on each leg from the first fall onto a tree branch.   Review of Systems  Constitutional: Negative for fever.  Respiratory: Negative for chest tightness and shortness of breath.   Cardiovascular: Negative for chest pain and palpitations.  Musculoskeletal: Positive for arthralgias, gait problem and joint swelling.  Skin: Positive for color change (bruises). Negative for rash.  Neurological: Negative for dizziness and headaches.  Hematological: Does not bruise/bleed easily.    Patient Active Problem List   Diagnosis Date Noted  . Mild peripheral edema 11/15/2017  . Perforated ear drum, left 06/25/2017  . History of pulmonary embolism 06/10/2017  . Environmental and seasonal allergies 04/07/2017  . Major depressive disorder with single episode, in partial remission ([place]) 04/07/2017  . Tinea versicolor 02/03/2017  . Vasomotor symptoms due to menopause 11/11/2016  . Morbid obesity ([place]) 09/10/2016  . B12 nutritional deficiency 08/05/2016  . Obstructive sleep apnea of adult 03/04/2016  . Bilateral lumbar radiculopathy 08/14/2015  .  Status post gastric bypass for obesity 07/04/2015  . Prediabetes 07/01/2015  . Headache, migraine 04/04/2012    Prior to Admission medications   Medication Sig Start Date End Date Taking? Authorizing Provider  BD INTEGRA SYRINGE 25G X 1" 3 ML MISC USE AS DIRECTED 11/07/17  Yes Glean Hess, MD  Cetirizine HCl 10 MG CAPS Take 1 capsule (10 mg total) by mouth daily. Patient taking differently: Take 10 mg by mouth at bedtime.  04/07/17  Yes Glean Hess, MD  cyclobenzaprine (FLEXERIL) 10 MG tablet Take 1 tablet by mouth 3 (three) times daily. 12/28/17  Yes [provider]  fluticasone (FLONASE) 50 MCG/ACT nasal spray USE 2 SPRAYS IN EACH NOSTRIL DAILY 09/29/17  Yes Glean Hess, MD  gabapentin (NEURONTIN) 300 MG capsule Take 600 mg by mouth at bedtime.  01/01/16  Yes [provider]  ketoconazole (NIZORAL) 2 % shampoo Apply 1 application topically daily as needed (for skin fungus on neck/back). 11/15/17  Yes Glean Hess, MD  latanoprost (XALATAN) 0.005 % ophthalmic solution Place 1 drop into both eyes at bedtime.    Yes [provider]  oxyCODONE-acetaminophen (PERCOCET/ROXICET) 5-325 MG tablet Take 1 tablet by mouth daily. 12/15/17  Yes [provider]  rizatriptan (MAXALT-MLT) 10 MG disintegrating tablet DISSOLVE ONE TABLET BY MOUTH AS NEEDED FOR MIGRAINE. MAY REPEAT IN 2 HOURS IF NEEDED 07/02/17  Yes Glean Hess, MD  triamterene-hydrochlorothiazide (MAXZIDE-25) 37.5-25 MG tablet TAKE 1 TABLET BY MOUTH EVERY DAY AS NEEDED 10/29/17  Yes Glean Hess, MD    Allergies  Allergen Reactions  . Morphine And Related Hives, Shortness Of Breath, Nausea And Vomiting and Other (See Comments)    "can't breathe"  . Penicillins Anaphylaxis, Hives, Shortness Of Breath and Nausea And Vomiting    "can't breathe", Has patient had a PCN reaction causing immediate rash, facial/tongue/throat swelling, SOB or lightheadedness with hypotension: Yes Has patient  had a PCN reaction causing severe rash involving mucus membranes or skin necrosis: No Has patient had a PCN reaction that required hospitalization No Has patient had a PCN reaction occurring within the last 10 years: No If all of the above answers are "NO", then may proceed with Cephalosporin use.   . Latex Hives  . Baclofen Nausea Only    Jittery, anxious    Past Surgical History:  Procedure Laterality Date  . ABDOMINAL HYSTERECTOMY  10/2006  . BACK SURGERY  2014   lumbar fusion  . BACK SURGERY  2009   discectomy  . GASTRIC BYPASS  2005  . KNEE ARTHROSCOPY WITH LATERAL MENISECTOMY Right 05/27/2017   Procedure: KNEE ARTHROSCOPY WITH LATERAL MENISECTOMY;  Surgeon: Hessie Knows, MD;  Location: [facility];  Service: Orthopedics;  Laterality: Right;  . KNEE ARTHROSCOPY WITH LATERAL RELEASE Right 05/27/2017   Procedure: KNEE ARTHROSCOPY WITH LATERAL RELEASE;  Surgeon: Hessie Knows, MD;  Location: [facility];  Service: Orthopedics;  Laterality: Right;  . LAPAROSCOPIC SALPINGO OOPHERECTOMY Bilateral 10/05/2016   Procedure: LAPAROSCOPIC SALPINGO OOPHORECTOMY;  Surgeon: Brayton Mars, MD;  Location: [facility];  Service: Gynecology;  Laterality: Bilateral;  . REDUCTION MAMMAPLASTY Bilateral    2001  . SPINAL CORD STIMULATOR INSERTION N/A 03/20/2016   Procedure: LUMBAR SPINAL CORD STIMULATOR INSERTION;  Surgeon: Eustace Moore, MD;  Location: [place] [facility];  Service: Neurosurgery;  Laterality: N/A;  . TONSILLECTOMY      Social History   Tobacco Use  . Smoking status: Never Smoker  . Smokeless tobacco: Never Used  Substance Use Topics  . Alcohol use: No    Alcohol/week: 0.0 oz  . Drug use: No     Medication list has been reviewed and updated.  PHQ 2/9 Scores 01/04/2018 06/25/2017 03/04/2016 08/14/2015  PHQ - 2 Score 0 - 0 0  PHQ- 9 Score 0 - - -  Exception Documentation - Medical reason - -    Physical Exam  Constitutional: She is oriented to person, place, and time. She appears  well-developed. No distress.  HENT:  Head: Normocephalic and atraumatic.  Cardiovascular: Normal rate, regular rhythm and normal heart sounds.  Pulmonary/Chest: Effort normal and breath sounds normal. No respiratory distress.  Musculoskeletal: Normal range of motion.       Right knee: She exhibits ecchymosis and erythema. Tenderness found. Medial joint line and lateral joint line tenderness noted.       Left knee: Tenderness found. Medial joint line and lateral joint line tenderness noted.       Legs: Neurological: She is alert and oriented to person, place, and time.  Skin: Skin is warm and dry. No rash noted.  Psychiatric: She has a normal mood and affect. Her behavior is normal. Thought content normal.    BP 128/78   Pulse 98   Ht 5\' 7"  (1.702 m)   Wt (!) 336 lb (152.4 kg)   SpO2 97%   BMI 52.63 kg/m   Assessment and Plan: 1. Acute pain of both knees Fall on 4/18 and 4/20 at home onto grass Continue percocet If no fracture or dislocation - continue conservative management for another  week; will refer to Ortho if pain persists/worsens (prefers [facility]) - DG Knee Complete 4 Views Left; Future - DG Knee Complete 4 Views Right; Future   No orders of the defined types were placed in this encounter.   Partially dictated using Editor, commissioning. Any errors are unintentional.  Halina Maidens, MD [facility]  01/17/2018

## 2018-01-20 DIAGNOSIS — M4800 Spinal stenosis, site unspecified: Secondary | ICD-10-CM | POA: Diagnosis not present

## 2018-01-20 DIAGNOSIS — G4733 Obstructive sleep apnea (adult) (pediatric): Secondary | ICD-10-CM | POA: Diagnosis not present

## 2018-01-26 ENCOUNTER — Ambulatory Visit: Payer: Medicare HMO | Admitting: Internal Medicine

## 2018-01-27 ENCOUNTER — Ambulatory Visit
Admission: RE | Admit: 2018-01-27 | Discharge: 2018-01-27 | Disposition: A | Discharge status: Home / Self Care | Payer: Medicare HMO | Source: Ambulatory Visit | Attending: Internal Medicine | Admitting: Internal Medicine

## 2018-01-27 DIAGNOSIS — Z1231 Encounter for screening mammogram for malignant neoplasm of breast: Secondary | ICD-10-CM | POA: Insufficient documentation

## 2018-01-27 DIAGNOSIS — Z1239 Encounter for other screening for malignant neoplasm of breast: Secondary | ICD-10-CM

## 2018-02-05 ENCOUNTER — Encounter: Payer: Self-pay | Admitting: Emergency Medicine

## 2018-02-05 ENCOUNTER — Emergency Department: Payer: Medicare HMO

## 2018-02-05 ENCOUNTER — Emergency Department
Admission: EM | Admit: 2018-02-05 | Discharge: 2018-02-05 | Disposition: A | Discharge status: Home / Self Care | Payer: Medicare HMO | Attending: Emergency Medicine | Admitting: Emergency Medicine

## 2018-02-05 ENCOUNTER — Other Ambulatory Visit: Payer: Self-pay

## 2018-02-05 DIAGNOSIS — S80211A Abrasion, right knee, initial encounter: Secondary | ICD-10-CM | POA: Diagnosis not present

## 2018-02-05 DIAGNOSIS — Y999 Unspecified external cause status: Secondary | ICD-10-CM | POA: Diagnosis not present

## 2018-02-05 DIAGNOSIS — Z79899 Other long term (current) drug therapy: Secondary | ICD-10-CM | POA: Diagnosis not present

## 2018-02-05 DIAGNOSIS — W19XXXA Unspecified fall, initial encounter: Secondary | ICD-10-CM

## 2018-02-05 DIAGNOSIS — Y9389 Activity, other specified: Secondary | ICD-10-CM | POA: Diagnosis not present

## 2018-02-05 DIAGNOSIS — S8001XA Contusion of right knee, initial encounter: Secondary | ICD-10-CM | POA: Diagnosis not present

## 2018-02-05 DIAGNOSIS — S7001XA Contusion of right hip, initial encounter: Secondary | ICD-10-CM | POA: Diagnosis not present

## 2018-02-05 DIAGNOSIS — Y929 Unspecified place or not applicable: Secondary | ICD-10-CM | POA: Insufficient documentation

## 2018-02-05 DIAGNOSIS — S8991XA Unspecified injury of right lower leg, initial encounter: Secondary | ICD-10-CM | POA: Diagnosis not present

## 2018-02-05 DIAGNOSIS — S79911A Unspecified injury of right hip, initial encounter: Secondary | ICD-10-CM | POA: Diagnosis not present

## 2018-02-05 DIAGNOSIS — M25561 Pain in right knee: Secondary | ICD-10-CM | POA: Diagnosis not present

## 2018-02-05 DIAGNOSIS — Z91040 Latex allergy status: Secondary | ICD-10-CM | POA: Diagnosis not present

## 2018-02-05 DIAGNOSIS — M25551 Pain in right hip: Secondary | ICD-10-CM | POA: Diagnosis not present

## 2018-02-05 DIAGNOSIS — M7071 Other bursitis of hip, right hip: Secondary | ICD-10-CM | POA: Diagnosis not present

## 2018-02-05 HISTORY — DX: Acute embolism and thrombosis of unspecified deep veins of unspecified lower extremity: I82.409

## 2018-02-05 MED ORDER — MELOXICAM 15 MG PO TABS: 15.0000 mg | ORAL_TABLET | Freq: Every day | ORAL | 0 refills | Status: DC

## 2018-02-05 NOTE — ED Triage Notes (Signed)
Fell on Thursday, mechanical fall, c/o right knee and right thigh pain.

## 2018-02-05 NOTE — ED Provider Notes (Signed)
[facility] Emergency Department Provider Note  ____________________________________________  Time seen: Approximately 2:59 PM  I have reviewed the triage vital signs and the nursing notes.   HISTORY  Chief Complaint Fall    HPI Kimberly Cowan is a 52 y.o. female who presents the emergency department complaining of right knee and right hip pain.  Patient reports that she was attempting to get out of her car when she fell landing on her knee 2 days ago.  Patient reports that she has abrasion to the right knee and bruising to the right hip.  Patient reports pain to both the hip joint as well as the knee.  Patient is also reporting swelling of the right lower extremity and a "burning sensation."  Patient did not hit her head or lose consciousness.  No other injury or complaint.  Patient has a history of chronic pain, DVT, peripheral edema, sleep apnea, lumbar radiculopathy.  Past Medical History:  Diagnosis Date  . Anxiety   . Arthritis    lower spine  . Chronic back pain   . Clinical depression 04/04/2012  . Depression   . DVT (deep venous thrombosis) ([place])   . Glaucoma    RIGHT EYE  . Headache(784.0)    migraines  . Ovarian cyst   . Pneumonia    walking pneumonia  . Sleep apnea    no cpcap     last sleep study > 4 yrs    Patient Active Problem List   Diagnosis Date Noted  . Mild peripheral edema 11/15/2017  . Perforated ear drum, left 06/25/2017  . History of pulmonary embolism 06/10/2017  . Environmental and seasonal allergies 04/07/2017  . Major depressive disorder with single episode, in partial remission ([place]) 04/07/2017  . Tinea versicolor 02/03/2017  . Vasomotor symptoms due to menopause 11/11/2016  . Morbid obesity ([place]) 09/10/2016  . B12 nutritional deficiency 08/05/2016  . Obstructive sleep apnea of adult 03/04/2016  . Bilateral lumbar radiculopathy 08/14/2015  . Status post gastric bypass for obesity 07/04/2015  .  Prediabetes 07/01/2015  . Headache, migraine 04/04/2012    Past Surgical History:  Procedure Laterality Date  . ABDOMINAL HYSTERECTOMY  10/2006  . BACK SURGERY  2014   lumbar fusion  . BACK SURGERY  2009   discectomy  . GASTRIC BYPASS  2005  . KNEE ARTHROSCOPY WITH LATERAL MENISECTOMY Right 05/27/2017   Procedure: KNEE ARTHROSCOPY WITH LATERAL MENISECTOMY;  Surgeon: Hessie Knows, MD;  Location: [facility] ORS;  Service: Orthopedics;  Laterality: Right;  . KNEE ARTHROSCOPY WITH LATERAL RELEASE Right 05/27/2017   Procedure: KNEE ARTHROSCOPY WITH LATERAL RELEASE;  Surgeon: Hessie Knows, MD;  Location: [facility] ORS;  Service: Orthopedics;  Laterality: Right;  . LAPAROSCOPIC SALPINGO OOPHERECTOMY Bilateral 10/05/2016   Procedure: LAPAROSCOPIC SALPINGO OOPHORECTOMY;  Surgeon: Brayton Mars, MD;  Location: [facility] ORS;  Service: Gynecology;  Laterality: Bilateral;  . REDUCTION MAMMAPLASTY Bilateral    2001  . SPINAL CORD STIMULATOR INSERTION N/A 03/20/2016   Procedure: LUMBAR SPINAL CORD STIMULATOR INSERTION;  Surgeon: Eustace Moore, MD;  Location: [place] NEURO ORS;  Service: Neurosurgery;  Laterality: N/A;  . TONSILLECTOMY      Prior to Admission medications   Medication Sig Start Date End Date Taking? Authorizing Provider  BD INTEGRA SYRINGE 25G X 1" 3 ML MISC USE AS DIRECTED 11/07/17   Glean Hess, MD  Cetirizine HCl 10 MG CAPS Take 1 capsule (10 mg total) by mouth daily. Patient taking differently: Take 10 mg by  mouth at bedtime.  04/07/17   Glean Hess, MD  cyclobenzaprine (FLEXERIL) 10 MG tablet Take 1 tablet by mouth 3 (three) times daily. 12/28/17   [provider]  fluticasone Asencion Islam) 50 MCG/ACT nasal spray USE 2 SPRAYS IN [facility] NOSTRIL DAILY 09/29/17   Glean Hess, MD  gabapentin (NEURONTIN) 300 MG capsule Take 600 mg by mouth at bedtime.  01/01/16   [provider]  ketoconazole (NIZORAL) 2 % shampoo Apply 1 application topically daily as needed (for skin fungus  on neck/back). 11/15/17   Glean Hess, MD  latanoprost (XALATAN) 0.005 % ophthalmic solution Place 1 drop into both eyes at bedtime.     [provider]  meloxicam (MOBIC) 15 MG tablet Take 1 tablet (15 mg total) by mouth daily. 02/05/18   Cuthriell, Charline Bills, PA-C  oxyCODONE-acetaminophen (PERCOCET/ROXICET) 5-325 MG tablet Take 1 tablet by mouth daily. 12/15/17   [provider]  rizatriptan (MAXALT-MLT) 10 MG disintegrating tablet DISSOLVE ONE TABLET BY MOUTH AS NEEDED FOR MIGRAINE. MAY REPEAT IN 2 HOURS IF NEEDED 07/02/17   Glean Hess, MD  triamterene-hydrochlorothiazide (YIRSWNI-62) 37.5-25 MG tablet TAKE 1 TABLET BY MOUTH EVERY DAY AS NEEDED 10/29/17   Glean Hess, MD    Allergies Morphine and related; Penicillins; Latex; and Baclofen  Family History  Problem Relation Age of Onset  . Diabetes Mother   . Hypertension Mother   . Diabetes Sister   . Hypertension Father   . Hypertension Sister   . Breast cancer Neg Hx     Social History Social History   Tobacco Use  . Smoking status: Never Smoker  . Smokeless tobacco: Never Used  Substance Use Topics  . Alcohol use: No    Alcohol/week: 0.0 oz  . Drug use: No     Review of Systems  Constitutional: No fever/chills Eyes: No visual changes.  Cardiovascular: no chest pain.  Reports right lower extremity edema. Respiratory: no cough. No SOB. Gastrointestinal: No abdominal pain.  No nausea, no vomiting.   Musculoskeletal: Positive for right knee and right hip pain Skin: Negative for rash, abrasions, lacerations, ecchymosis. Neurological: Negative for headaches, focal weakness or numbness. 10-point ROS otherwise negative.  ____________________________________________   PHYSICAL EXAM:  VITAL SIGNS: ED Triage Vitals  Enc Vitals Group     BP 02/05/18 1353 (!) 145/87     Pulse Rate 02/05/18 1353 79     Resp 02/05/18 1353 16     Temp 02/05/18 1353 98.2 F (36.8 C)     Temp Source 02/05/18  1353 Oral     SpO2 02/05/18 1353 98 %     Weight 02/05/18 1352 (!) 336 lb (152.4 kg)     Height 02/05/18 1352 5\' 7"  (1.702 m)     Head Circumference --      Peak Flow --      Pain Score 02/05/18 1352 7     Pain Loc --      Pain Edu? --      Excl. in [place]? --      Constitutional: Alert and oriented. Well appearing and in no acute distress.  Morbid obesity. Eyes: Conjunctivae are normal. PERRL. EOMI. Head: Atraumatic. Neck: No stridor.    Cardiovascular: Normal rate, regular rhythm. Normal S1 and S2.  Good peripheral circulation. Respiratory: Normal respiratory effort without tachypnea or retractions. Lungs CTAB. Good air entry to the bases with no decreased or absent breath sounds. Musculoskeletal: Full range of motion to all extremities. No  gross deformities appreciated.  No gross deformity to hip, knee.  Patient has mild abrasions noted to the right knee.  Mild ecchymosis noted to the right hip.  Patient is diffusely tender to palpation over the lateral aspect of the hip joint with no palpable abnormality.  Diffuse tenderness from iliac crest through mid femur..  Patient is also tender to palpation globally over the anterior aspect of the knee with no specific point tenderness.  Varus, valgus, Lachman's, Murphy's is negative.  Patient is able to flex, extend, rotate properly.  No gross edema of the right lower extremity versus left lower extremity. Neurologic:  Normal speech and language. No gross focal neurologic deficits are appreciated.  Skin:  Skin is warm, dry and intact. No rash noted. Psychiatric: Mood and affect are normal. Speech and behavior are normal. Patient exhibits appropriate insight and judgement.   ____________________________________________   LABS (all labs ordered are listed, but only abnormal results are displayed)  Labs Reviewed - No data to  display ____________________________________________  EKG   ____________________________________________  RADIOLOGY Diamantina Providence Cuthriell, personally viewed and evaluated these images (plain radiographs) as part of my medical decision making, as well as reviewing the written report by the radiologist.  I concur with radiologist finding of no acute osseous abnormality to the hip or knee.  Dg Knee Complete 4 Views Right  Result Date: 02/05/2018 CLINICAL DATA:  Status post fall out of car 3 days ago. Right knee pain. EXAM: RIGHT KNEE - COMPLETE 4+ VIEW COMPARISON:  January 17, 2018 FINDINGS: No evidence of fracture, dislocation, or joint effusion. Minimal tibial spine osteophytosis is noted. Soft tissues are unremarkable. IMPRESSION: No acute fracture or dislocation. Electronically Signed   By: Abelardo Diesel M.D.   On: 02/05/2018 15:39   Dg Hip Unilat W Or Wo Pelvis 2-3 Views Right  Result Date: 02/05/2018 CLINICAL DATA:  Patient fell out of her car 3 days ago with right hip pain EXAM: DG HIP (WITH OR WITHOUT PELVIS) 2-3V RIGHT COMPARISON:  None. FINDINGS: There is no evidence of hip fracture or dislocation. Status post prior fixation of the lower lumbar spine. IMPRESSION: No acute abnormality. Electronically Signed   By: Abelardo Diesel M.D.   On: 02/05/2018 15:37    ____________________________________________    PROCEDURES  Procedure(s) performed:    Procedures    Medications - No data to display   ____________________________________________   INITIAL IMPRESSION / ASSESSMENT AND PLAN / ED COURSE  Pertinent labs & imaging results that were available during my care of the patient were reviewed by me and considered in my medical decision making (see chart for details).  Review of the [place] CSRS was performed in accordance of the [place] prior to dispensing any controlled drugs.     Patient's diagnosis is consistent with fall resulting in contusion of the hip and knee with  bursitis of the right hip.  Patient presents with sharp pain to the hip and knee status post a fall getting out of her vehicle.  Exam was reassuring and x-rays revealed no acute osseous abnormality.  No indication for further work-up.. Patient will be discharged home with prescriptions for meloxicam for symptom improvement. Patient is to follow up with primary care as needed or otherwise directed. Patient is given ED precautions to return to the ED for any worsening or new symptoms.     ____________________________________________  FINAL CLINICAL IMPRESSION(S) / ED DIAGNOSES  Final diagnoses:  Fall, initial encounter  Contusion of right hip, initial encounter  Bursitis of right hip, unspecified bursa  Contusion of right knee, initial encounter      NEW MEDICATIONS STARTED DURING THIS VISIT:  ED Discharge Orders        Ordered    meloxicam (MOBIC) 15 MG tablet  Daily     02/05/18 1624          This chart was dictated using voice recognition software/Dragon. Despite best efforts to proofread, errors can occur which can change the meaning. Any change was purely unintentional.    Darletta Moll, PA-C 02/05/18 1625    Clearnce Hasten Randall An, MD 02/05/18 [phone]

## 2018-02-10 ENCOUNTER — Encounter: Payer: Self-pay | Admitting: Internal Medicine

## 2018-02-18 ENCOUNTER — Telehealth: Payer: Self-pay | Admitting: Internal Medicine

## 2018-02-18 NOTE — Telephone Encounter (Signed)
Spoke to pt to schedule for AWV, she will c/b soon last awv 07/04/15

## 2018-03-09 ENCOUNTER — Ambulatory Visit (INDEPENDENT_AMBULATORY_CARE_PROVIDER_SITE_OTHER): Payer: Medicare HMO

## 2018-03-09 VITALS — BP 128/70 | HR 90 | Temp 98.1°F | Ht 67.0 in | Wt 335.4 lb

## 2018-03-09 DIAGNOSIS — Z598 Other problems related to housing and economic circumstances: Secondary | ICD-10-CM

## 2018-03-09 DIAGNOSIS — Z0000 Encounter for general adult medical examination without abnormal findings: Secondary | ICD-10-CM

## 2018-03-09 DIAGNOSIS — Z599 Problem related to housing and economic circumstances, unspecified: Secondary | ICD-10-CM

## 2018-03-09 NOTE — Progress Notes (Signed)
Subjective:   Kimberly Cowan is a 52 y.o. female who presents for Medicare Annual (Subsequent) preventive examination.  Review of Systems:  N/A Cardiac Risk Factors include: sedentary lifestyle     Objective:     Vitals: BP 128/70 (BP Location: Right Arm, Patient Position: Sitting, Cuff Size: Large)   Pulse 90   Temp 98.1 F (36.7 C) (Oral)   Ht 5\' 7"  (1.702 m)   Wt (!) 335 lb 6.4 oz (152.1 kg)   SpO2 95%   BMI 52.53 kg/m   Body mass index is 52.53 kg/m.  Advanced Directives 03/09/2018 06/10/2017 06/09/2017 05/19/2017 10/05/2016 06/07/2016 05/04/2016  Does Patient Have a Medical Advance Directive? No No No No No No No  Would patient like information on creating a medical advance directive? Yes (MAU/Ambulatory/Procedural Areas - Information given) No - Patient declined No - Patient declined No - Patient declined No - Patient declined No - patient declined information -  Pre-existing out of facility DNR order (yellow form or pink MOST form) - - - - - - -    Tobacco Social History   Tobacco Use  Smoking Status Never Smoker  Smokeless Tobacco Never Used  Tobacco Comment   smoking cessation materials not required     Counseling given: No Comment: smoking cessation materials not required  Clinical Intake:  Pre-visit preparation completed: Yes  Pain : No/denies pain   BMI - recorded: 52.61 Nutritional Status: BMI > 30  Obese Nutritional Risks: None Diabetes: No  How often do you need to have someone help you when you read instructions, pamphlets, or other written materials from your doctor or pharmacy?: 1 - Never  Interpreter Needed?: No  Information entered by :: AEversole, LPN  Past Medical History:  Diagnosis Date  . Anxiety   . Arthritis    lower spine  . Chronic back pain   . Clinical depression 04/04/2012  . Depression   . DVT (deep venous thrombosis) ([place])   . Glaucoma    RIGHT EYE  . Headache(784.0)    migraines  . Ovarian cyst   .  Pneumonia    walking pneumonia  . Sleep apnea    no cpcap     last sleep study > 4 yrs   Past Surgical History:  Procedure Laterality Date  . ABDOMINAL HYSTERECTOMY  10/2006  . BACK SURGERY  2014   lumbar fusion  . BACK SURGERY  2009   discectomy  . GASTRIC BYPASS  2005  . KNEE ARTHROSCOPY WITH LATERAL MENISECTOMY Right 05/27/2017   Procedure: KNEE ARTHROSCOPY WITH LATERAL MENISECTOMY;  Surgeon: Hessie Knows, MD;  Location: [facility] ORS;  Service: Orthopedics;  Laterality: Right;  . KNEE ARTHROSCOPY WITH LATERAL RELEASE Right 05/27/2017   Procedure: KNEE ARTHROSCOPY WITH LATERAL RELEASE;  Surgeon: Hessie Knows, MD;  Location: [facility] ORS;  Service: Orthopedics;  Laterality: Right;  . LAPAROSCOPIC SALPINGO OOPHERECTOMY Bilateral 10/05/2016   Procedure: LAPAROSCOPIC SALPINGO OOPHORECTOMY;  Surgeon: Brayton Mars, MD;  Location: [facility] ORS;  Service: Gynecology;  Laterality: Bilateral;  . REDUCTION MAMMAPLASTY Bilateral    2001  . SPINAL CORD STIMULATOR INSERTION N/A 03/20/2016   Procedure: LUMBAR SPINAL CORD STIMULATOR INSERTION;  Surgeon: Eustace Moore, MD;  Location: [place] NEURO ORS;  Service: Neurosurgery;  Laterality: N/A;  . TONSILLECTOMY     Family History  Problem Relation Age of Onset  . Diabetes Mother   . Hypertension Mother   . Diabetes Sister   . Hypertension Sister   . Hypertension  Father   . Breast cancer Neg Hx    Social History   Socioeconomic History  . Marital status: Widowed    Spouse name: Not on file  . Number of children: 2  . Years of education: some college  . Highest education level: 12th grade  Occupational History  . Occupation: disabled  Social Needs  . Financial resource strain: Somewhat hard  . Food insecurity:    Worry: Often true    Inability: Often true  . Transportation needs:    Medical: No    Non-medical: No  Tobacco Use  . Smoking status: Never Smoker  . Smokeless tobacco: Never Used  . Tobacco comment: smoking cessation materials not  required  Substance and Sexual Activity  . Alcohol use: No    Alcohol/week: 0.0 oz  . Drug use: No  . Sexual activity: Not Currently    Birth control/protection: Surgical  Lifestyle  . Physical activity:    Days per week: 0 days    Minutes per session: 0 min  . Stress: Not at all  Relationships  . Social connections:    Talks on phone: Patient refused    Gets together: Patient refused    Attends religious service: Patient refused    Active member of club or organization: Patient refused    Attends meetings of clubs or organizations: Patient refused    Relationship status: Widowed  Other Topics Concern  . Not on file  Social History Narrative  . Not on file   Referral sent to [employer] for food assistance and financial assistance with dentures  Outpatient Encounter Medications as of 03/09/2018  Medication Sig  . BD INTEGRA SYRINGE 25G X 1" 3 ML MISC USE AS DIRECTED  . Cetirizine HCl 10 MG CAPS Take 1 capsule (10 mg total) by mouth daily. (Patient taking differently: Take 10 mg by mouth at bedtime. )  . cyclobenzaprine (FLEXERIL) 10 MG tablet Take 1 tablet by mouth 3 (three) times daily.  . fluticasone (FLONASE) 50 MCG/ACT nasal spray USE 2 SPRAYS IN EACH NOSTRIL DAILY  . gabapentin (NEURONTIN) 300 MG capsule Take 600 mg by mouth at bedtime.   Marland Kitchen latanoprost (XALATAN) 0.005 % ophthalmic solution Place 1 drop into both eyes at bedtime.   Marland Kitchen oxyCODONE-acetaminophen (PERCOCET/ROXICET) 5-325 MG tablet Take 1 tablet by mouth daily.  . rizatriptan (MAXALT-MLT) 10 MG disintegrating tablet DISSOLVE ONE TABLET BY MOUTH AS NEEDED FOR MIGRAINE. MAY REPEAT IN 2 HOURS IF NEEDED  . triamterene-hydrochlorothiazide (MAXZIDE-25) 37.5-25 MG tablet TAKE 1 TABLET BY MOUTH EVERY DAY AS NEEDED  . ketoconazole (NIZORAL) 2 % shampoo Apply 1 application topically daily as needed (for skin fungus on neck/back). (Patient not taking: Reported on 03/09/2018)  . meloxicam (MOBIC) 15 MG tablet Take 1 tablet (15 mg total)  by mouth daily.   Facility-Administered Encounter Medications as of 03/09/2018  Medication  . cyanocobalamin ((VITAMIN B-12)) injection 1,000 mcg    Activities of Daily Living In your present state of health, do you have any difficulty performing the following activities: 03/09/2018 06/12/2017  Hearing? N -  Comment denies hearing aids -  Vision? N -  Comment wears eyeglasses, R glaucoma -  Difficulty concentrating or making decisions? Y -  Comment short term memory loss -  Walking or climbing stairs? Y -  Comment back pain, joint pain, dyspnea -  Dressing or bathing? N -  Doing errands, shopping? N N  Preparing Food and eating ? N -  Comment denies dentures -  Using the Toilet? N -  In the past six months, have you accidently leaked urine? N -  Do you have problems with loss of bowel control? N -  Managing your Medications? N -  Managing your Finances? N -  Housekeeping or managing your Housekeeping? N -  Some recent data might be hidden    Patient Care Team: Glean Hess, MD as PCP - General (Internal Medicine) Earnie Larsson, MD as Consulting Physician (Neurosurgery) Marlaine Hind, MD as Consulting Physician (Physical Medicine and Rehabilitation) Eustace Moore, MD as Consulting Physician (Neurosurgery)    Assessment:   This is a routine wellness examination for Vala.  Exercise Activities and Dietary recommendations Current Exercise Habits: The patient does not participate in regular exercise at present, Exercise limited by: None identified  Goals    . DIET - INCREASE WATER INTAKE     Recommend to drink at least 6-8 8oz glasses of water per day.    . Exercise 150 min/wk Moderate Activity     Recommend to exercise for at least 150 minutes per week.       Fall Risk Fall Risk  03/09/2018 08/14/2015 07/04/2015  Falls in the past year? Yes No Yes  Number falls in past yr: 2 or more - 2 or more  Injury with Fall? Yes - No  Risk Factor Category  High Fall Risk  - High Fall Risk  Risk for fall due to : Other (Comment);Impaired vision;Medication side effect;Impaired balance/gait - -  Risk for fall due to: Comment arthritis; wears eyeglasses - -  Follow up Falls evaluation completed;Education provided;Falls prevention discussed - -   FALL RISK PREVENTION PERTAINING TO HOME: Is your home free of loose throw rugs in walkways, pet beds, electrical cords, etc? Yes Is there adequate lighting in your home to reduce risk of falls?  Yes Are there stairs in or around your home WITH handrails? Yes  ASSISTIVE DEVICES UTILIZED TO PREVENT FALLS: Use of a cane, walker or w/c? No Grab bars in the bathroom? Yes  Shower chair or a place to sit while bathing? Yes An elevated toilet seat or a handicapped toilet? Yes  Timed Get Up and Go Performed: Yes. Pt ambulated 10 feet within 25 sec. Gait slow, steady and without the use of an assistive device. No intervention required at this time. Fall risk prevention has been discussed.  Community Resource Referral:  [place] Referral not required at this time.  Depression Screen PHQ 2/9 Scores 03/09/2018 01/04/2018 06/25/2017 03/04/2016  PHQ - 2 Score 0 0 - 0  PHQ- 9 Score 0 0 - -  Exception Documentation - - Medical reason -     Cognitive Function     6CIT Screen 03/09/2018  What Year? 0 points  What month? 0 points  What time? 0 points  Count back from 20 0 points  Months in reverse 0 points  Repeat phrase 0 points  Total Score 0    Immunization History  Administered Date(s) Administered  . Influenza,inj,Quad PF,6+ Mos 08/14/2016, 11/15/2017  . Tdap 04/24/2014    Qualifies for Shingles Vaccine? No  Screening Tests Health Maintenance  Topic Date Due  . HIV Screening  09/30/2023 (Originally 03/25/1981)  . INFLUENZA VACCINE  04/28/2018  . MAMMOGRAM  01/28/2019  . TETANUS/TDAP  04/24/2024    Cancer Screenings: Lung: Low Dose CT Chest recommended if Age 26-80 years, 30 pack-year currently  smoking OR have quit w/in 15years. Patient does not qualify. Breast:  Up to  date on Mammogram? Yes. Completed 01/27/18. Repeat every year   Bone Density/Dexa: Not yet required Colorectal: Not yet required  Additional Screenings: Hepatitis C Screening: Does not qualify    Plan:  I have personally reviewed and addressed the Medicare Annual Wellness questionnaire and have noted the following in the patient's chart:  A. Medical and social history B. Use of alcohol, tobacco or illicit drugs  C. Current medications and supplements D. Functional ability and status E.  Nutritional status F.  Physical activity G. Advance directives H. List of other physicians I.  Hospitalizations, surgeries, and ER visits in previous 12 months J.  [place] such as hearing and vision if needed, cognitive and depression L. Referrals and appointments  In addition, I have reviewed and discussed with patient certain preventive protocols, quality metrics, and best practice recommendations. A written personalized care plan for preventive services as well as general preventive health recommendations were provided to patient.  Signed,  Aleatha Borer, LPN Nurse Health Advisor  MD Recommendations: Referral sent to [employer] for food assistance and financial assistance with dentures

## 2018-03-09 NOTE — Patient Instructions (Addendum)
Kimberly Cowan , Thank you for taking time to come for your Medicare Wellness Visit. I appreciate your ongoing commitment to your health goals. Please review the following plan we discussed and let me know if I can assist you in the future.   Screening recommendations/referrals: Colorectal Screening: Not yet required Mammogram: Up to date Bone Density: Not yet required  Vision and Dental Exams: Recommended annual ophthalmology exams for early detection of glaucoma and other disorders of the eye Recommended annual dental exams for proper oral hygiene  Vaccinations: Influenza vaccine: Up to date Pneumococcal vaccine: After age 65 Tdap vaccine: Up to date Shingles vaccine: After age 50    Advanced directives: Advance directive discussed with you today. I have provided a copy for you to complete at home and have notarized. Once this is complete please bring a copy in to our office so we can scan it into your chart.  Conditions/risks identified: Recommend to drink at least 6-8 8oz glasses of water per day.  Next appointment: Please schedule your Annual Wellness Visit with your Nurse Health Advisor in one year.  Preventive Care 40-64 Years, Female Preventive care refers to lifestyle choices and visits with your health care provider that can promote health and wellness. What does preventive care include?  A yearly physical exam. This is also called an annual well check.  Dental exams once or twice a year.  Routine eye exams. Ask your health care provider how often you should have your eyes checked.  Personal lifestyle choices, including:  Daily care of your teeth and gums.  Regular physical activity.  Eating a healthy diet.  Avoiding tobacco and drug use.  Limiting alcohol use.  Practicing safe sex.  Taking low-dose aspirin daily starting at age 50.  Taking vitamin and mineral supplements as recommended by your health care provider. What happens during an annual well  check? The services and screenings done by your health care provider during your annual well check will depend on your age, overall health, lifestyle risk factors, and family history of disease. Counseling  Your health care provider may ask you questions about your:  Alcohol use.  Tobacco use.  Drug use.  Emotional well-being.  Home and relationship well-being.  Sexual activity.  Eating habits.  Work and work environment.  Method of birth control.  Menstrual cycle.  Pregnancy history. Screening  You may have the following tests or measurements:  Height, weight, and BMI.  Blood pressure.  Lipid and cholesterol levels. These may be checked every 5 years, or more frequently if you are over 50 years old.  Skin check.  Lung cancer screening. You may have this screening every year starting at age 55 if you have a 30-pack-year history of smoking and currently smoke or have quit within the past 15 years.  Fecal occult blood test (FOBT) of the stool. You may have this test every year starting at age 50.  Flexible sigmoidoscopy or colonoscopy. You may have a sigmoidoscopy every 5 years or a colonoscopy every 10 years starting at age 50.  Hepatitis C blood test.  Hepatitis B blood test.  Sexually transmitted disease (STD) testing.  Diabetes screening. This is done by checking your blood sugar (glucose) after you have not eaten for a while (fasting). You may have this done every 1-3 years.  Mammogram. This may be done every 1-2 years. Talk to your health care provider about when you should start having regular mammograms. This may depend on whether you have a family   history of breast cancer.  BRCA-related cancer screening. This may be done if you have a family history of breast, ovarian, tubal, or peritoneal cancers.  Pelvic exam and Pap test. This may be done every 3 years starting at age 21. Starting at age 30, this may be done every 5 years if you have a Pap test in  combination with an HPV test.  Bone density scan. This is done to screen for osteoporosis. You may have this scan if you are at high risk for osteoporosis. Discuss your test results, treatment options, and if necessary, the need for more tests with your health care provider. Vaccines  Your health care provider may recommend certain vaccines, such as:  Influenza vaccine. This is recommended every year.  Tetanus, diphtheria, and acellular pertussis (Tdap, Td) vaccine. You may need a Td booster every 10 years.  Zoster vaccine. You may need this after age 60.  Pneumococcal 13-valent conjugate (PCV13) vaccine. You may need this if you have certain conditions and were not previously vaccinated.  Pneumococcal polysaccharide (PPSV23) vaccine. You may need one or two doses if you smoke cigarettes or if you have certain conditions. Talk to your health care provider about which screenings and vaccines you need and how often you need them. This information is not intended to replace advice given to you by your health care provider. Make sure you discuss any questions you have with your health care provider. Document Released: 10/11/2015 Document Revised: 06/03/2016 Document Reviewed: 07/16/2015 Elsevier Interactive Patient Education  2017 [employer]    Fall Prevention in the Home Falls can cause injuries. They can happen to people of all ages. There are many things you can do to make your home safe and to help prevent falls. What can I do on the outside of my home?  Regularly fix the edges of walkways and driveways and fix any cracks.  Remove anything that might make you trip as you walk through a door, such as a raised step or threshold.  Trim any bushes or trees on the path to your home.  Use bright outdoor lighting.  Clear any walking paths of anything that might make someone trip, such as rocks or tools.  Regularly check to see if handrails are loose or broken. Make sure that both  sides of any steps have handrails.  Any raised decks and porches should have guardrails on the edges.  Have any leaves, snow, or ice cleared regularly.  Use sand or salt on walking paths during winter.  Clean up any spills in your garage right away. This includes oil or grease spills. What can I do in the bathroom?  Use night lights.  Install grab bars by the toilet and in the tub and shower. Do not use towel bars as grab bars.  Use non-skid mats or decals in the tub or shower.  If you need to sit down in the shower, use a plastic, non-slip stool.  Keep the floor dry. Clean up any water that spills on the floor as soon as it happens.  Remove soap buildup in the tub or shower regularly.  Attach bath mats securely with double-sided non-slip rug tape.  Do not have throw rugs and other things on the floor that can make you trip. What can I do in the bedroom?  Use night lights.  Make sure that you have a light by your bed that is easy to reach.  Do not use any sheets or blankets that are too   big for your bed. They should not hang down onto the floor.  Have a firm chair that has side arms. You can use this for support while you get dressed.  Do not have throw rugs and other things on the floor that can make you trip. What can I do in the kitchen?  Clean up any spills right away.  Avoid walking on wet floors.  Keep items that you use a lot in easy-to-reach places.  If you need to reach something above you, use a strong step stool that has a grab bar.  Keep electrical cords out of the way.  Do not use floor polish or wax that makes floors slippery. If you must use wax, use non-skid floor wax.  Do not have throw rugs and other things on the floor that can make you trip. What can I do with my stairs?  Do not leave any items on the stairs.  Make sure that there are handrails on both sides of the stairs and use them. Fix handrails that are broken or loose. Make sure that  handrails are as long as the stairways.  Check any carpeting to make sure that it is firmly attached to the stairs. Fix any carpet that is loose or worn.  Avoid having throw rugs at the top or bottom of the stairs. If you do have throw rugs, attach them to the floor with carpet tape.  Make sure that you have a light switch at the top of the stairs and the bottom of the stairs. If you do not have them, ask someone to add them for you. What else can I do to help prevent falls?  Wear shoes that:  Do not have high heels.  Have rubber bottoms.  Are comfortable and fit you well.  Are closed at the toe. Do not wear sandals.  If you use a stepladder:  Make sure that it is fully opened. Do not climb a closed stepladder.  Make sure that both sides of the stepladder are locked into place.  Ask someone to hold it for you, if possible.  Clearly mark and make sure that you can see:  Any grab bars or handrails.  First and last steps.  Where the edge of each step is.  Use tools that help you move around (mobility aids) if they are needed. These include:  Canes.  Walkers.  Scooters.  Crutches.  Turn on the lights when you go into a dark area. Replace any light bulbs as soon as they burn out.  Set up your furniture so you have a clear path. Avoid moving your furniture around.  If any of your floors are uneven, fix them.  If there are any pets around you, be aware of where they are.  Review your medicines with your doctor. Some medicines can make you feel dizzy. This can increase your chance of falling. Ask your doctor what other things that you can do to help prevent falls. This information is not intended to replace advice given to you by your health care provider. Make sure you discuss any questions you have with your health care provider. Document Released: 07/11/2009 Document Revised: 02/20/2016 Document Reviewed: 10/19/2014 Elsevier Interactive Patient Education  2017  Elsevier Inc.  

## 2018-03-10 DIAGNOSIS — M5416 Radiculopathy, lumbar region: Secondary | ICD-10-CM | POA: Diagnosis not present

## 2018-03-10 DIAGNOSIS — F419 Anxiety disorder, unspecified: Secondary | ICD-10-CM | POA: Diagnosis not present

## 2018-03-10 DIAGNOSIS — M48061 Spinal stenosis, lumbar region without neurogenic claudication: Secondary | ICD-10-CM | POA: Diagnosis not present

## 2018-03-10 DIAGNOSIS — M961 Postlaminectomy syndrome, not elsewhere classified: Secondary | ICD-10-CM | POA: Diagnosis not present

## 2018-03-24 ENCOUNTER — Ambulatory Visit (INDEPENDENT_AMBULATORY_CARE_PROVIDER_SITE_OTHER): Payer: Medicare HMO | Admitting: Internal Medicine

## 2018-03-24 ENCOUNTER — Encounter: Payer: Self-pay | Admitting: Internal Medicine

## 2018-03-24 VITALS — BP 110/80 | HR 96 | Temp 98.0°F

## 2018-03-24 DIAGNOSIS — Z1211 Encounter for screening for malignant neoplasm of colon: Secondary | ICD-10-CM

## 2018-03-24 DIAGNOSIS — E538 Deficiency of other specified B group vitamins: Secondary | ICD-10-CM | POA: Diagnosis not present

## 2018-03-24 DIAGNOSIS — M778 Other enthesopathies, not elsewhere classified: Secondary | ICD-10-CM | POA: Diagnosis not present

## 2018-03-24 MED ORDER — PREDNISONE 10 MG PO TABS: ORAL_TABLET | ORAL | 0 refills | Status: DC

## 2018-03-24 MED ORDER — CYANOCOBALAMIN 1000 MCG/ML IJ SOLN
1000.0000 ug | Freq: Once | INTRAMUSCULAR | Status: AC
  Administered 2018-03-24: 1000 ug via INTRAMUSCULAR

## 2018-03-24 NOTE — Progress Notes (Signed)
Date:  03/24/2018   Name:  Kimberly Cowan   DOB:  01/04/1966   MRN:  [id number]   Chief Complaint: B12 Injection and Elbow Pain (left elbow) HPI Elbow pain started about 2 weeks ago - no known injury but may have lifted something heavy.  Now very stiff and painful in the morning.  Mobic has not helped.  No swelling but tender to touch.  Tried ice and heat with no benefit.  B12 def - needs to get B12 injection.  Insurance will no longer cover it at the pharmacy.   Review of Systems  Constitutional: Negative for chills, fatigue and fever.  Respiratory: Negative for choking and shortness of breath.   Cardiovascular: Negative for chest pain and palpitations.  Musculoskeletal: Positive for arthralgias. Negative for joint swelling and myalgias.    Patient Active Problem List   Diagnosis Date Noted  . Mild peripheral edema 11/15/2017  . Perforated ear drum, left 06/25/2017  . History of pulmonary embolism 06/10/2017  . Environmental and seasonal allergies 04/07/2017  . Major depressive disorder with single episode, in partial remission ([place]) 04/07/2017  . Tinea versicolor 02/03/2017  . Vasomotor symptoms due to menopause 11/11/2016  . Morbid obesity ([place]) 09/10/2016  . B12 nutritional deficiency 08/05/2016  . Obstructive sleep apnea of adult 03/04/2016  . Bilateral lumbar radiculopathy 08/14/2015  . Status post gastric bypass for obesity 07/04/2015  . Prediabetes 07/01/2015  . Headache, migraine 04/04/2012    Prior to Admission medications   Medication Sig Start Date End Date Taking? Authorizing Provider  BD INTEGRA SYRINGE 25G X 1" 3 ML MISC USE AS DIRECTED 11/07/17  Yes Glean Hess, MD  Cetirizine HCl 10 MG CAPS Take 1 capsule (10 mg total) by mouth daily. Patient taking differently: Take 10 mg by mouth at bedtime.  04/07/17  Yes Glean Hess, MD  cyclobenzaprine (FLEXERIL) 10 MG tablet Take 1 tablet by mouth 3 (three) times daily. 12/28/17  Yes [provider]  fluticasone (FLONASE) 50 MCG/ACT nasal spray USE 2 SPRAYS IN EACH NOSTRIL DAILY 09/29/17  Yes Glean Hess, MD  gabapentin (NEURONTIN) 300 MG capsule Take 600 mg by mouth at bedtime.  01/01/16  Yes [provider]  ketoconazole (NIZORAL) 2 % shampoo Apply 1 application topically daily as needed (for skin fungus on neck/back). 11/15/17  Yes Glean Hess, MD  latanoprost (XALATAN) 0.005 % ophthalmic solution Place 1 drop into both eyes at bedtime.    Yes [provider]  meloxicam (MOBIC) 15 MG tablet Take 1 tablet (15 mg total) by mouth daily. 02/05/18  Yes Cuthriell, Charline Bills, PA-C  oxyCODONE-acetaminophen (PERCOCET/ROXICET) 5-325 MG tablet Take 1 tablet by mouth daily. 12/15/17  Yes [provider]  rizatriptan (MAXALT-MLT) 10 MG disintegrating tablet DISSOLVE ONE TABLET BY MOUTH AS NEEDED FOR MIGRAINE. MAY REPEAT IN 2 HOURS IF NEEDED 07/02/17  Yes Glean Hess, MD  triamterene-hydrochlorothiazide (DVVOHYW-73) 37.5-25 MG tablet TAKE 1 TABLET BY MOUTH EVERY DAY AS NEEDED 10/29/17  Yes Glean Hess, MD    Allergies  Allergen Reactions  . Morphine And Related Hives, Shortness Of Breath, Nausea And Vomiting and Other (See Comments)    "can't breathe"  . Penicillins Anaphylaxis, Hives, Shortness Of Breath and Nausea And Vomiting    "can't breathe", Has patient had a PCN reaction causing immediate rash, facial/tongue/throat swelling, SOB or lightheadedness with hypotension: Yes Has patient had a PCN reaction causing severe rash involving mucus membranes or skin necrosis:  No Has patient had a PCN reaction that required hospitalization No Has patient had a PCN reaction occurring within the last 10 years: No If all of the above answers are "NO", then may proceed with Cephalosporin use.   . Latex Hives  . Baclofen Nausea Only    Jittery, anxious    Past Surgical History:  Procedure Laterality Date  . ABDOMINAL HYSTERECTOMY  10/2006  . BACK  SURGERY  2014   lumbar fusion  . BACK SURGERY  2009   discectomy  . GASTRIC BYPASS  2005  . KNEE ARTHROSCOPY WITH LATERAL MENISECTOMY Right 05/27/2017   Procedure: KNEE ARTHROSCOPY WITH LATERAL MENISECTOMY;  Surgeon: Hessie Knows, MD;  Location: [facility];  Service: Orthopedics;  Laterality: Right;  . KNEE ARTHROSCOPY WITH LATERAL RELEASE Right 05/27/2017   Procedure: KNEE ARTHROSCOPY WITH LATERAL RELEASE;  Surgeon: Hessie Knows, MD;  Location: [facility];  Service: Orthopedics;  Laterality: Right;  . LAPAROSCOPIC SALPINGO OOPHERECTOMY Bilateral 10/05/2016   Procedure: LAPAROSCOPIC SALPINGO OOPHORECTOMY;  Surgeon: Brayton Mars, MD;  Location: [facility];  Service: Gynecology;  Laterality: Bilateral;  . REDUCTION MAMMAPLASTY Bilateral    2001  . SPINAL CORD STIMULATOR INSERTION N/A 03/20/2016   Procedure: LUMBAR SPINAL CORD STIMULATOR INSERTION;  Surgeon: Eustace Moore, MD;  Location: [facility];  Service: Neurosurgery;  Laterality: N/A;  . TONSILLECTOMY      Social History   Tobacco Use  . Smoking status: Never Smoker  . Smokeless tobacco: Never Used  . Tobacco comment: smoking cessation materials not required  Substance Use Topics  . Alcohol use: No    Alcohol/week: 0.0 oz  . Drug use: No     Medication list has been reviewed and updated.  Current Meds  Medication Sig  . BD INTEGRA SYRINGE 25G X 1" 3 ML MISC USE AS DIRECTED  . Cetirizine HCl 10 MG CAPS Take 1 capsule (10 mg total) by mouth daily. (Patient taking differently: Take 10 mg by mouth at bedtime. )  . cyclobenzaprine (FLEXERIL) 10 MG tablet Take 1 tablet by mouth 3 (three) times daily.  . fluticasone (FLONASE) 50 MCG/ACT nasal spray USE 2 SPRAYS IN EACH NOSTRIL DAILY  . gabapentin (NEURONTIN) 300 MG capsule Take 600 mg by mouth at bedtime.   Marland Kitchen ketoconazole (NIZORAL) 2 % shampoo Apply 1 application topically daily as needed (for skin fungus on neck/back).  . latanoprost (XALATAN) 0.005 % ophthalmic solution Place  1 drop into both eyes at bedtime.   . meloxicam (MOBIC) 15 MG tablet Take 1 tablet (15 mg total) by mouth daily.  Marland Kitchen oxyCODONE-acetaminophen (PERCOCET/ROXICET) 5-325 MG tablet Take 1 tablet by mouth daily.  . rizatriptan (MAXALT-MLT) 10 MG disintegrating tablet DISSOLVE ONE TABLET BY MOUTH AS NEEDED FOR MIGRAINE. MAY REPEAT IN 2 HOURS IF NEEDED  . triamterene-hydrochlorothiazide (MAXZIDE-25) 37.5-25 MG tablet TAKE 1 TABLET BY MOUTH EVERY DAY AS NEEDED   Current Facility-Administered Medications for the 03/24/18 encounter (Office Visit) with Glean Hess, MD  Medication  . cyanocobalamin ((VITAMIN B-12)) injection 1,000 mcg    PHQ 2/9 Scores 03/09/2018 01/04/2018 06/25/2017 03/04/2016  PHQ - 2 Score 0 0 - 0  PHQ- 9 Score 0 0 - -  Exception Documentation - - Medical reason -    Physical Exam  Constitutional: She is oriented to person, place, and time. She appears well-developed. No distress.  HENT:  Head: Normocephalic and atraumatic.  Cardiovascular: Normal rate, regular rhythm and normal heart sounds.  Pulmonary/Chest: Effort normal and breath sounds  normal. No respiratory distress.  Musculoskeletal: Normal range of motion.       Left elbow: She exhibits normal range of motion and no swelling. Tenderness found. Medial epicondyle and olecranon process tenderness noted. No lateral epicondyle tenderness noted.  Neurological: She is alert and oriented to person, place, and time.  Skin: Skin is warm and dry. No rash noted.  Psychiatric: She has a normal mood and affect. Her behavior is normal. Thought content normal.  Nursing note and vitals reviewed.   BP 110/80   Pulse 96   Temp 98 F (36.7 C)   SpO2 96%   Assessment and Plan: 1. B12 nutritional deficiency - cyanocobalamin ((VITAMIN B-12)) injection 1,000 mcg  2. Left elbow tendonitis Continue heat May need Ortho eval if no improvement on prednisone - predniSONE (DELTASONE) 10 MG tablet; Take 6 on day 1, 5 on day 2, 4 on day 3,  3 on day 4, 2 on day 5 and 1 on day 1 then stop.  Dispense: 21 tablet; Refill: 0  3. Colon cancer screening - Ambulatory referral to Gastroenterology   Meds ordered this encounter  Medications  . cyanocobalamin ((VITAMIN B-12)) injection 1,000 mcg  . predniSONE (DELTASONE) 10 MG tablet    Sig: Take 6 on day 1, 5 on day 2, 4 on day 3, 3 on day 4, 2 on day 5 and 1 on day 1 then stop.    Dispense:  21 tablet    Refill:  0    Partially dictated using Editor, commissioning. Any errors are unintentional.  Halina Maidens, MD [facility]  03/24/2018

## 2018-03-25 ENCOUNTER — Other Ambulatory Visit: Payer: Self-pay

## 2018-03-25 DIAGNOSIS — Z1211 Encounter for screening for malignant neoplasm of colon: Secondary | ICD-10-CM

## 2018-03-29 ENCOUNTER — Other Ambulatory Visit: Payer: Self-pay

## 2018-03-29 MED ORDER — NA SULFATE-K SULFATE-MG SULF 17.5-3.13-1.6 GM/177ML PO SOLN: 1.0000 | Freq: Once | ORAL | 0 refills | Status: AC

## 2018-04-11 ENCOUNTER — Encounter: Payer: Self-pay | Admitting: *Deleted

## 2018-04-11 ENCOUNTER — Other Ambulatory Visit: Payer: Self-pay | Admitting: Physical Medicine and Rehabilitation

## 2018-04-11 DIAGNOSIS — M48061 Spinal stenosis, lumbar region without neurogenic claudication: Secondary | ICD-10-CM

## 2018-04-12 ENCOUNTER — Ambulatory Visit: Payer: Medicare HMO | Admitting: Certified Registered Nurse Anesthetist

## 2018-04-12 ENCOUNTER — Encounter
Admission: RE | Disposition: A | Discharge status: Home / Self Care | Payer: Self-pay | Source: Ambulatory Visit | Attending: Gastroenterology

## 2018-04-12 ENCOUNTER — Ambulatory Visit
Admission: RE | Admit: 2018-04-12 | Discharge: 2018-04-12 | Disposition: A | Discharge status: Home / Self Care | Payer: Medicare HMO | Source: Ambulatory Visit | Attending: Gastroenterology | Admitting: Gastroenterology

## 2018-04-12 ENCOUNTER — Encounter: Payer: Self-pay | Admitting: *Deleted

## 2018-04-12 DIAGNOSIS — Z86718 Personal history of other venous thrombosis and embolism: Secondary | ICD-10-CM | POA: Diagnosis not present

## 2018-04-12 DIAGNOSIS — Z9884 Bariatric surgery status: Secondary | ICD-10-CM | POA: Diagnosis not present

## 2018-04-12 DIAGNOSIS — M549 Dorsalgia, unspecified: Secondary | ICD-10-CM | POA: Insufficient documentation

## 2018-04-12 DIAGNOSIS — G4730 Sleep apnea, unspecified: Secondary | ICD-10-CM | POA: Insufficient documentation

## 2018-04-12 DIAGNOSIS — Z79891 Long term (current) use of opiate analgesic: Secondary | ICD-10-CM | POA: Insufficient documentation

## 2018-04-12 DIAGNOSIS — Z6841 Body Mass Index (BMI) 40.0 and over, adult: Secondary | ICD-10-CM | POA: Diagnosis not present

## 2018-04-12 DIAGNOSIS — Z1211 Encounter for screening for malignant neoplasm of colon: Secondary | ICD-10-CM

## 2018-04-12 DIAGNOSIS — H409 Unspecified glaucoma: Secondary | ICD-10-CM | POA: Diagnosis not present

## 2018-04-12 DIAGNOSIS — Z79899 Other long term (current) drug therapy: Secondary | ICD-10-CM | POA: Insufficient documentation

## 2018-04-12 DIAGNOSIS — G8929 Other chronic pain: Secondary | ICD-10-CM | POA: Diagnosis not present

## 2018-04-12 DIAGNOSIS — Z7951 Long term (current) use of inhaled steroids: Secondary | ICD-10-CM | POA: Diagnosis not present

## 2018-04-12 DIAGNOSIS — G4733 Obstructive sleep apnea (adult) (pediatric): Secondary | ICD-10-CM | POA: Diagnosis not present

## 2018-04-12 DIAGNOSIS — G43909 Migraine, unspecified, not intractable, without status migrainosus: Secondary | ICD-10-CM | POA: Insufficient documentation

## 2018-04-12 HISTORY — PX: COLONOSCOPY WITH PROPOFOL: SHX5780

## 2018-04-12 SURGERY — COLONOSCOPY WITH PROPOFOL
Anesthesia: General

## 2018-04-12 MED ORDER — LIDOCAINE HCL (CARDIAC) PF 100 MG/5ML IV SOSY
PREFILLED_SYRINGE | INTRAVENOUS | Status: DC | PRN
  Administered 2018-04-12: 50 mg via INTRAVENOUS

## 2018-04-12 MED ORDER — PROPOFOL 500 MG/50ML IV EMUL
INTRAVENOUS | Status: AC
  Filled 2018-04-12: qty 50

## 2018-04-12 MED ORDER — PROPOFOL 10 MG/ML IV BOLUS
INTRAVENOUS | Status: DC | PRN
  Administered 2018-04-12: 30 mg via INTRAVENOUS
  Administered 2018-04-12: 70 mg via INTRAVENOUS

## 2018-04-12 MED ORDER — PROPOFOL 500 MG/50ML IV EMUL
INTRAVENOUS | Status: DC | PRN
  Administered 2018-04-12: 175 ug/kg/min via INTRAVENOUS

## 2018-04-12 MED ORDER — SODIUM CHLORIDE 0.9 % IV SOLN
INTRAVENOUS | Status: DC
  Administered 2018-04-12: 14:00:00 via INTRAVENOUS

## 2018-04-12 NOTE — Anesthesia Post-op Follow-up Note (Signed)
Anesthesia QCDR form completed.        

## 2018-04-12 NOTE — Transfer of Care (Signed)
Immediate Anesthesia Transfer of Care Note  Patient: Kimberly Cowan  Procedure(s) Performed: COLONOSCOPY WITH PROPOFOL (N/A )  Patient Location: PACU  Anesthesia Type:General  Level of Consciousness: awake, alert  and oriented  Airway & Oxygen Therapy: Patient Spontanous Breathing and Patient connected to nasal cannula oxygen  Post-op Assessment: Report given to RN and Post -op Vital signs reviewed and stable  Post vital signs: Reviewed and stable  Last Vitals:  Vitals Value Taken Time  BP 110/75 04/12/2018  2:53 PM  Temp 36.5 C 04/12/2018  2:53 PM  Pulse 104 04/12/2018  2:53 PM  Resp 24 04/12/2018  2:53 PM  SpO2 95 % 04/12/2018  2:53 PM    Last Pain:  Vitals:   04/12/18 1324  TempSrc: Tympanic  PainSc: 6          Complications: No apparent anesthesia complications

## 2018-04-12 NOTE — Op Note (Signed)
[facility] Gastroenterology Patient Name: Kimberly Cowan Procedure Date: 04/12/2018 2:17 PM MRN: [id number] Account #: [id number] Date of Birth: 07-Feb-1966 Admit Type: Outpatient Age: 52 Room: [facility] ENDO ROOM 4 Gender: Female Note Status: Finalized Procedure:            Colonoscopy Indications:          Screening for colorectal malignant neoplasm, This is                        the patient's first colonoscopy Providers:            Lin Landsman MD, MD Referring MD:         Halina Maidens, MD (Referring MD) Medicines:            Monitored Anesthesia Care Complications:        No immediate complications. Estimated blood loss: None. Procedure:            Pre-Anesthesia Assessment:                       - Prior to the procedure, a History and Physical was                        performed, and patient medications and allergies were                        reviewed. The patient is competent. The risks and                        benefits of the procedure and the sedation options and                        risks were discussed with the patient. All questions                        were answered and informed consent was obtained.                        Patient identification and proposed procedure were                        verified by the physician, the nurse, the                        anesthesiologist, the anesthetist and the technician in                        the pre-procedure area in the procedure room in the                        endoscopy suite. Mental Status Examination: alert and                        oriented. Airway Examination: normal oropharyngeal                        airway and neck mobility. Respiratory Examination:                        clear to auscultation. CV Examination: normal.  Prophylactic Antibiotics: The patient does not require                        prophylactic antibiotics. Prior Anticoagulants: The                         patient has taken no previous anticoagulant or                        antiplatelet agents. ASA Grade Assessment: III - A                        patient with severe systemic disease. After reviewing                        the risks and benefits, the patient was deemed in                        satisfactory condition to undergo the procedure. The                        anesthesia plan was to use monitored anesthesia care                        (MAC). Immediately prior to administration of                        medications, the patient was re-assessed for adequacy                        to receive sedatives. The heart rate, respiratory rate,                        oxygen saturations, blood pressure, adequacy of                        pulmonary ventilation, and response to care were                        monitored throughout the procedure. The physical status                        of the patient was re-assessed after the procedure.                       After obtaining informed consent, the colonoscope was                        passed under direct vision. Throughout the procedure,                        the patient's blood pressure, pulse, and oxygen                        saturations were monitored continuously. The                        Colonoscope was introduced through the anus and  advanced to the the cecum, identified by appendiceal                        orifice and ileocecal valve. The colonoscopy was                        technically difficult and complex due to significant                        looping and the patient's body habitus. Successful                        completion of the procedure was aided by applying                        abdominal pressure. The patient tolerated the procedure                        well. The quality of the bowel preparation was                        evaluated using the BBPS [facility] Bowel Preparation                         Scale) with scores of: Right Colon = 3, Transverse                        Colon = 3 and Left Colon = 3 (entire mucosa seen well                        with no residual staining, small fragments of stool or                        opaque liquid). The total BBPS score equals 9. Findings:      The perianal and digital rectal examinations were normal. Pertinent       negatives include normal sphincter tone and no palpable rectal lesions.      The colon (entire examined portion) appeared normal.      The retroflexed view of the distal rectum and anal verge was normal and       showed no anal or rectal abnormalities. Impression:           - The entire examined colon is normal.                       - The distal rectum and anal verge are normal on                        retroflexion view.                       - No specimens collected. Recommendation:       - Discharge patient to home (with escort).                       - Resume previous diet today.                       - Continue present medications.                       -  Repeat colonoscopy in 10 years for surveillance. Procedure Code(s):    --- Professional ---                       E3343, Colorectal cancer screening; colonoscopy on                        individual not meeting criteria for high risk Diagnosis Code(s):    --- Professional ---                       Z12.11, Encounter for screening for malignant neoplasm                        of colon CPT copyright 2017 American Medical Association. All rights reserved. The codes documented in this report are preliminary and upon coder review may  be revised to meet current compliance requirements. Dr. Ulyess Mort Lin Landsman MD, MD 04/12/2018 2:49:38 PM This report has been signed electronically. Number of Addenda: 0 Note Initiated On: 04/12/2018 2:17 PM Scope Withdrawal Time: 0 hours 10 minutes 4 seconds  Total Procedure Duration: 0 hours 17 minutes 31 seconds        [facility]

## 2018-04-12 NOTE — Anesthesia Postprocedure Evaluation (Signed)
Anesthesia Post Note  Patient: Kimberly Cowan  Procedure(s) Performed: COLONOSCOPY WITH PROPOFOL (N/A )  Patient location during evaluation: Endoscopy Anesthesia Type: General Level of consciousness: awake and alert Pain management: pain level controlled Vital Signs Assessment: post-procedure vital signs reviewed and stable Respiratory status: spontaneous breathing, nonlabored ventilation, respiratory function stable and patient connected to nasal cannula oxygen Cardiovascular status: blood pressure returned to baseline and stable Postop Assessment: no apparent nausea or vomiting Anesthetic complications: no     Last Vitals:  Vitals:   04/12/18 1453 04/12/18 1503  BP: 117/75 122/65  Pulse: (!) 105 97  Resp: (!) 28 (!) 24  Temp: 36.5 C   SpO2: 96% 98%    Last Pain:  Vitals:   04/12/18 1503  TempSrc:   PainSc: 0-No pain                 Martha Clan

## 2018-04-12 NOTE — Anesthesia Preprocedure Evaluation (Signed)
Anesthesia Evaluation  Patient identified by MRN, date of birth, ID band Patient awake    Reviewed: Allergy & Precautions, NPO status , Patient's Chart, lab work & pertinent test results  History of Anesthesia Complications Negative for: history of anesthetic complications  Airway Mallampati: II  TM Distance: >3 FB Neck ROM: Full    Dental no notable dental hx.    Pulmonary sleep apnea and Continuous Positive Airway Pressure Ventilation , neg COPD,    breath sounds clear to auscultation- rhonchi (-) wheezing      Cardiovascular (-) hypertension(-) CAD, (-) Past MI and (-) Cardiac Stents  Rhythm:Regular Rate:Normal - Systolic murmurs and - Diastolic murmurs    Neuro/Psych  Headaches, PSYCHIATRIC DISORDERS Anxiety Depression    GI/Hepatic negative GI ROS, Neg liver ROS,   Endo/Other  neg diabetesMorbid obesity  Renal/GU negative Renal ROS     Musculoskeletal  (+) Arthritis ,   Abdominal (+) + obese,   Peds  Hematology negative hematology ROS (+)   Anesthesia Other Findings Past Medical History: No date: Anxiety No date: Arthritis     Comment:  lower spine No date: Chronic back pain 04/04/2012: Clinical depression No date: Depression No date: Glaucoma     Comment:  RIGHT EYE No date: Headache(784.0)     Comment:  migraines No date: Ovarian cyst No date: Pneumonia     Comment:  walking pneumonia No date: Sleep apnea     Comment:  no cpcap     last sleep study > 4 yrs   Reproductive/Obstetrics                             Anesthesia Physical  Anesthesia Plan  ASA: III  Anesthesia Plan: General   Post-op Pain Management:    Induction: Intravenous  PONV Risk Score and Plan: 2 and Propofol infusion and TIVA  Airway Management Planned: Nasal Cannula  Additional Equipment:   Intra-op Plan:   Post-operative Plan:   Informed Consent: I have reviewed the patients History and  Physical, chart, labs and discussed the procedure including the risks, benefits and alternatives for the proposed anesthesia with the patient or authorized representative who has indicated his/her understanding and acceptance.   Dental advisory given  Plan Discussed with: CRNA and Anesthesiologist  Anesthesia Plan Comments:         Anesthesia Quick Evaluation

## 2018-04-12 NOTE — H&P (Signed)
Cephas Darby, MD [address]  [place]  [place], [place] [address]  Main: [phone]  Fax: [phone] Pager: [phone]  Primary Care Physician:  Glean Hess, MD Primary Gastroenterologist:  Dr. Cephas Darby  Pre-Procedure History & Physical: HPI:  Kimberly Cowan is a 52 y.o. female is here for an colonoscopy.   Past Medical History:  Diagnosis Date  . Anxiety   . Arthritis    lower spine  . Chronic back pain   . Clinical depression 04/04/2012  . Depression   . DVT (deep venous thrombosis) ([place])   . Glaucoma    RIGHT EYE  . Headache(784.0)    migraines  . Ovarian cyst   . Pneumonia    walking pneumonia  . Sleep apnea    no cpcap     last sleep study > 4 yrs    Past Surgical History:  Procedure Laterality Date  . ABDOMINAL HYSTERECTOMY  10/2006  . BACK SURGERY  2014   lumbar fusion  . BACK SURGERY  2009   discectomy  . GASTRIC BYPASS  2005  . KNEE ARTHROSCOPY WITH LATERAL MENISECTOMY Right 05/27/2017   Procedure: KNEE ARTHROSCOPY WITH LATERAL MENISECTOMY;  Surgeon: Hessie Knows, MD;  Location: [facility] ORS;  Service: Orthopedics;  Laterality: Right;  . KNEE ARTHROSCOPY WITH LATERAL RELEASE Right 05/27/2017   Procedure: KNEE ARTHROSCOPY WITH LATERAL RELEASE;  Surgeon: Hessie Knows, MD;  Location: [facility] ORS;  Service: Orthopedics;  Laterality: Right;  . LAPAROSCOPIC SALPINGO OOPHERECTOMY Bilateral 10/05/2016   Procedure: LAPAROSCOPIC SALPINGO OOPHORECTOMY;  Surgeon: Brayton Mars, MD;  Location: [facility] ORS;  Service: Gynecology;  Laterality: Bilateral;  . REDUCTION MAMMAPLASTY Bilateral    2001  . SPINAL CORD STIMULATOR INSERTION N/A 03/20/2016   Procedure: LUMBAR SPINAL CORD STIMULATOR INSERTION;  Surgeon: Eustace Moore, MD;  Location: [place] NEURO ORS;  Service: Neurosurgery;  Laterality: N/A;  . TONSILLECTOMY      Prior to Admission medications   Medication Sig Start Date End Date Taking? Authorizing Provider  BD INTEGRA SYRINGE  25G X 1" 3 ML MISC USE AS DIRECTED 11/07/17   Glean Hess, MD  Cetirizine HCl 10 MG CAPS Take 1 capsule (10 mg total) by mouth daily. Patient taking differently: Take 10 mg by mouth at bedtime.  04/07/17   Glean Hess, MD  cyclobenzaprine (FLEXERIL) 10 MG tablet Take 1 tablet by mouth 3 (three) times daily. 12/28/17   [provider]  fluticasone Asencion Islam) 50 MCG/ACT nasal spray USE 2 SPRAYS IN [facility] NOSTRIL DAILY 09/29/17   Glean Hess, MD  gabapentin (NEURONTIN) 300 MG capsule Take 600 mg by mouth at bedtime.  01/01/16   [provider]  ketoconazole (NIZORAL) 2 % shampoo Apply 1 application topically daily as needed (for skin fungus on neck/back). 11/15/17   Glean Hess, MD  latanoprost (XALATAN) 0.005 % ophthalmic solution Place 1 drop into both eyes at bedtime.     [provider]  oxyCODONE-acetaminophen (PERCOCET/ROXICET) 5-325 MG tablet Take 1 tablet by mouth daily. 12/15/17   [provider]  rizatriptan (MAXALT-MLT) 10 MG disintegrating tablet DISSOLVE ONE TABLET BY MOUTH AS NEEDED FOR MIGRAINE. MAY REPEAT IN 2 HOURS IF NEEDED 07/02/17   Glean Hess, MD  triamterene-hydrochlorothiazide ([id number]-17) 37.5-25 MG tablet TAKE 1 TABLET BY MOUTH EVERY DAY AS NEEDED 10/29/17   Glean Hess, MD    Allergies as of 03/25/2018 - Review Complete 03/24/2018  Allergen Reaction Noted  . Morphine and  related Hives, Shortness Of Breath, Nausea And Vomiting, and Other (See Comments) 10/28/2012  . Penicillins Anaphylaxis, Hives, Shortness Of Breath, and Nausea And Vomiting 10/28/2012  . Latex Hives 08/12/2015  . Baclofen Nausea Only 03/18/2016    Family History  Problem Relation Age of Onset  . Diabetes Mother   . Hypertension Mother   . Diabetes Sister   . Hypertension Sister   . Hypertension Father   . Breast cancer Neg Hx     Social History   Socioeconomic History  . Marital status: Widowed    Spouse name: Not on file  . Number of  children: 2  . Years of education: some college  . Highest education level: 12th grade  Occupational History  . Occupation: disabled  Social Needs  . Financial resource strain: Somewhat hard  . Food insecurity:    Worry: Often true    Inability: Often true  . Transportation needs:    Medical: No    Non-medical: No  Tobacco Use  . Smoking status: Never Smoker  . Smokeless tobacco: Never Used  . Tobacco comment: smoking cessation materials not required  Substance and Sexual Activity  . Alcohol use: No    Alcohol/week: 0.0 oz  . Drug use: No  . Sexual activity: Not Currently    Birth control/protection: Surgical  Lifestyle  . Physical activity:    Days per week: 0 days    Minutes per session: 0 min  . Stress: Not at all  Relationships  . Social connections:    Talks on phone: Patient refused    Gets together: Patient refused    Attends religious service: Patient refused    Active member of club or organization: Patient refused    Attends meetings of clubs or organizations: Patient refused    Relationship status: Widowed  . Intimate partner violence:    Fear of current or ex partner: No    Emotionally abused: No    Physically abused: No    Forced sexual activity: No  Other Topics Concern  . Not on file  Social History Narrative  . Not on file    Review of Systems: See HPI, otherwise negative ROS  Physical Exam: BP (!) 140/100   Pulse 96   Temp 97.6 F (36.4 C) (Tympanic)   Resp 20   Ht 5\' 7"  (1.702 m)   Wt (!) 337 lb (152.9 kg)   SpO2 100%   BMI 52.78 kg/m  General:   Alert,  pleasant and cooperative in NAD Head:  Normocephalic and atraumatic. Neck:  Supple; no masses or thyromegaly. Lungs:  Clear throughout to auscultation.    Heart:  Regular rate and rhythm. Abdomen:  Soft, nontender and nondistended. Normal bowel sounds, without guarding, and without rebound.   Neurologic:  Alert and  oriented x4;  grossly normal  neurologically.  Impression/Plan: Kimberly Cowan is here for an colonoscopy to be performed for colon cancer screening  Risks, benefits, limitations, and alternatives regarding  colonoscopy have been reviewed with the patient.  Questions have been answered.  All parties agreeable.   Sherri Sear, MD  04/12/2018, 2:26 PM

## 2018-04-12 NOTE — Anesthesia Procedure Notes (Signed)
Date/Time: 04/12/2018 2:23 PM Performed by: Johnna Acosta, CRNA Pre-anesthesia Checklist: Patient identified, Emergency Drugs available, Suction available, Patient being monitored and Timeout performed Patient Re-evaluated:Patient Re-evaluated prior to induction Oxygen Delivery Method: Nasal cannula Preoxygenation: Pre-oxygenation with 100% oxygen

## 2018-04-13 ENCOUNTER — Encounter: Payer: Self-pay | Admitting: Gastroenterology

## 2018-04-19 ENCOUNTER — Ambulatory Visit (INDEPENDENT_AMBULATORY_CARE_PROVIDER_SITE_OTHER): Payer: Medicare HMO | Admitting: Internal Medicine

## 2018-04-19 ENCOUNTER — Encounter: Payer: Self-pay | Admitting: Internal Medicine

## 2018-04-19 VITALS — BP 128/70 | HR 85 | Ht 67.0 in | Wt 335.0 lb

## 2018-04-19 DIAGNOSIS — E538 Deficiency of other specified B group vitamins: Secondary | ICD-10-CM

## 2018-04-19 DIAGNOSIS — G8929 Other chronic pain: Secondary | ICD-10-CM | POA: Diagnosis not present

## 2018-04-19 DIAGNOSIS — M25522 Pain in left elbow: Secondary | ICD-10-CM | POA: Diagnosis not present

## 2018-04-19 MED ORDER — PREDNISONE 10 MG PO TABS: ORAL_TABLET | ORAL | 0 refills | Status: DC

## 2018-04-19 NOTE — Progress Notes (Signed)
Date:  04/19/2018   Name:  Kimberly Cowan   DOB:  16-Jun-1966   MRN:  [id number]   Chief Complaint: Elbow Injury (Need referral to orthopedics. ) and b12 deficiency (B12 injection.) Elbow pain - persistent pain in left elbow.  She has had several falls but no direct blows to the elbow.  A month ago she was treated with prednisone taper which helped significantly but the pain has returned. She is reluctant to have cortisone injections but the last oral taper did not last.  Now the pain in her elbow is worse.  Review of Systems  Constitutional: Negative for chills and fatigue.  Respiratory: Negative for chest tightness and shortness of breath.   Cardiovascular: Negative for chest pain.  Musculoskeletal: Positive for arthralgias (left elbow), back pain and gait problem (frequent falls).    Patient Active Problem List   Diagnosis Date Noted  . Encounter for screening colonoscopy   . Mild peripheral edema 11/15/2017  . Perforated ear drum, left 06/25/2017  . History of pulmonary embolism 06/10/2017  . Environmental and seasonal allergies 04/07/2017  . Major depressive disorder with single episode, in partial remission ([place]) 04/07/2017  . Tinea versicolor 02/03/2017  . Vasomotor symptoms due to menopause 11/11/2016  . Morbid obesity ([place]) 09/10/2016  . B12 nutritional deficiency 08/05/2016  . Obstructive sleep apnea of adult 03/04/2016  . Bilateral lumbar radiculopathy 08/14/2015  . Status post gastric bypass for obesity 07/04/2015  . Prediabetes 07/01/2015  . Headache, migraine 04/04/2012    Prior to Admission medications   Medication Sig Start Date End Date Taking? Authorizing Provider  BD INTEGRA SYRINGE 25G X 1" 3 ML MISC USE AS DIRECTED 11/07/17  Yes Glean Hess, MD  Cetirizine HCl 10 MG CAPS Take 1 capsule (10 mg total) by mouth daily. Patient taking differently: Take 10 mg by mouth at bedtime.  04/07/17  Yes Glean Hess, MD  cyclobenzaprine  (FLEXERIL) 10 MG tablet Take 1 tablet by mouth 3 (three) times daily. 12/28/17  Yes [provider]  ferrous sulfate 325 (65 FE) MG EC tablet Take 325 mg by mouth 3 (three) times daily with meals.   Yes [provider]  fluticasone (FLONASE) 50 MCG/ACT nasal spray USE 2 SPRAYS IN EACH NOSTRIL DAILY 09/29/17  Yes Glean Hess, MD  gabapentin (NEURONTIN) 300 MG capsule Take 600 mg by mouth at bedtime.  01/01/16  Yes [provider]  ketoconazole (NIZORAL) 2 % shampoo Apply 1 application topically daily as needed (for skin fungus on neck/back). 11/15/17  Yes Glean Hess, MD  latanoprost (XALATAN) 0.005 % ophthalmic solution Place 1 drop into both eyes at bedtime.    Yes [provider]  oxyCODONE-acetaminophen (PERCOCET/ROXICET) 5-325 MG tablet Take 1 tablet by mouth daily. 12/15/17  Yes [provider]  rizatriptan (MAXALT-MLT) 10 MG disintegrating tablet DISSOLVE ONE TABLET BY MOUTH AS NEEDED FOR MIGRAINE. MAY REPEAT IN 2 HOURS IF NEEDED 07/02/17  Yes Glean Hess, MD  triamterene-hydrochlorothiazide ([id number]-56) 37.5-25 MG tablet TAKE 1 TABLET BY MOUTH EVERY DAY AS NEEDED 10/29/17  Yes Glean Hess, MD    Allergies  Allergen Reactions  . Morphine And Related Hives, Shortness Of Breath, Nausea And Vomiting and Other (See Comments)    "can't breathe"  . Penicillins Anaphylaxis, Hives, Shortness Of Breath and Nausea And Vomiting    "can't breathe", Has patient had a PCN reaction causing immediate rash, facial/tongue/throat swelling, SOB or lightheadedness with hypotension: Yes Has patient  had a PCN reaction causing severe rash involving mucus membranes or skin necrosis: No Has patient had a PCN reaction that required hospitalization No Has patient had a PCN reaction occurring within the last 10 years: No If all of the above answers are "NO", then may proceed with Cephalosporin use.   . Latex Hives  . Baclofen Nausea Only    Jittery, anxious      Past Surgical History:  Procedure Laterality Date  . ABDOMINAL HYSTERECTOMY  10/2006  . BACK SURGERY  2014   lumbar fusion  . BACK SURGERY  2009   discectomy  . COLONOSCOPY WITH PROPOFOL N/A 04/12/2018   Procedure: COLONOSCOPY WITH PROPOFOL;  Surgeon: Lin Landsman, MD;  Location: [facility]COPY;  Service: Gastroenterology;  Laterality: N/A;  . GASTRIC BYPASS  2005  . KNEE ARTHROSCOPY WITH LATERAL MENISECTOMY Right 05/27/2017   Procedure: KNEE ARTHROSCOPY WITH LATERAL MENISECTOMY;  Surgeon: Hessie Knows, MD;  Location: [facility] ORS;  Service: Orthopedics;  Laterality: Right;  . KNEE ARTHROSCOPY WITH LATERAL RELEASE Right 05/27/2017   Procedure: KNEE ARTHROSCOPY WITH LATERAL RELEASE;  Surgeon: Hessie Knows, MD;  Location: [facility] ORS;  Service: Orthopedics;  Laterality: Right;  . LAPAROSCOPIC SALPINGO OOPHERECTOMY Bilateral 10/05/2016   Procedure: LAPAROSCOPIC SALPINGO OOPHORECTOMY;  Surgeon: Brayton Mars, MD;  Location: [facility] ORS;  Service: Gynecology;  Laterality: Bilateral;  . REDUCTION MAMMAPLASTY Bilateral    2001  . SPINAL CORD STIMULATOR INSERTION N/A 03/20/2016   Procedure: LUMBAR SPINAL CORD STIMULATOR INSERTION;  Surgeon: Eustace Moore, MD;  Location: [place] NEURO ORS;  Service: Neurosurgery;  Laterality: N/A;  . TONSILLECTOMY      Social History   Tobacco Use  . Smoking status: Never Smoker  . Smokeless tobacco: Never Used  . Tobacco comment: smoking cessation materials not required  Substance Use Topics  . Alcohol use: No    Alcohol/week: 0.0 oz  . Drug use: No     Medication list has been reviewed and updated.  Current Meds  Medication Sig  . BD INTEGRA SYRINGE 25G X 1" 3 ML MISC USE AS DIRECTED  . Cetirizine HCl 10 MG CAPS Take 1 capsule (10 mg total) by mouth daily. (Patient taking differently: Take 10 mg by mouth at bedtime. )  . cyclobenzaprine (FLEXERIL) 10 MG tablet Take 1 tablet by mouth 3 (three) times daily.  . ferrous sulfate 325 (65 FE) MG EC  tablet Take 325 mg by mouth 3 (three) times daily with meals.  . fluticasone (FLONASE) 50 MCG/ACT nasal spray USE 2 SPRAYS IN EACH NOSTRIL DAILY  . gabapentin (NEURONTIN) 300 MG capsule Take 600 mg by mouth at bedtime.   Marland Kitchen ketoconazole (NIZORAL) 2 % shampoo Apply 1 application topically daily as needed (for skin fungus on neck/back).  . latanoprost (XALATAN) 0.005 % ophthalmic solution Place 1 drop into both eyes at bedtime.   Marland Kitchen oxyCODONE-acetaminophen (PERCOCET/ROXICET) 5-325 MG tablet Take 1 tablet by mouth daily.  . rizatriptan (MAXALT-MLT) 10 MG disintegrating tablet DISSOLVE ONE TABLET BY MOUTH AS NEEDED FOR MIGRAINE. MAY REPEAT IN 2 HOURS IF NEEDED  . triamterene-hydrochlorothiazide (MAXZIDE-25) 37.5-25 MG tablet TAKE 1 TABLET BY MOUTH EVERY DAY AS NEEDED    PHQ 2/9 Scores 03/09/2018 01/04/2018 06/25/2017 03/04/2016  PHQ - 2 Score 0 0 - 0  PHQ- 9 Score 0 0 - -  Exception Documentation - - Medical reason -    Physical Exam  Constitutional: She is oriented to person, place, and time. She appears well-developed. No distress.  HENT:  Head: Normocephalic and atraumatic.  Cardiovascular: Normal rate, regular rhythm and normal heart sounds.  Pulmonary/Chest: Effort normal and breath sounds normal. No respiratory distress.  Musculoskeletal:       Left elbow: She exhibits decreased range of motion. She exhibits no swelling. Tenderness found. Medial epicondyle and olecranon process tenderness noted. No lateral epicondyle tenderness noted.  Neurological: She is alert and oriented to person, place, and time.  Skin: Skin is warm and dry. No rash noted.  Psychiatric: She has a normal mood and affect. Her behavior is normal. Thought content normal.  Nursing note and vitals reviewed.   BP 128/70   Pulse 85   Ht 5\' 7"  (1.702 m)   Wt (!) 335 lb (152 kg)   SpO2 97%   BMI 52.47 kg/m   Assessment and Plan: 1. Elbow pain, chronic, left - Ambulatory referral to Orthopedic Surgery - predniSONE  (DELTASONE) 10 MG tablet; Take 6 on day 1, 5 on day 2, 4 on day 3, 3 on day 4, 2 on day 5 and 1 on day 1 then stop.  Dispense: 21 tablet; Refill: 0  2. B12 nutritional deficiency B12 injection given, continue monthly   Meds ordered this encounter  Medications  . predniSONE (DELTASONE) 10 MG tablet    Sig: Take 6 on day 1, 5 on day 2, 4 on day 3, 3 on day 4, 2 on day 5 and 1 on day 1 then stop.    Dispense:  21 tablet    Refill:  0    Partially dictated using Editor, commissioning. Any errors are unintentional.  Halina Maidens, MD [facility]  04/19/2018

## 2018-04-20 ENCOUNTER — Ambulatory Visit
Admission: RE | Admit: 2018-04-20 | Discharge: 2018-04-20 | Disposition: A | Discharge status: Home / Self Care | Payer: Medicare HMO | Source: Ambulatory Visit | Attending: Physical Medicine and Rehabilitation | Admitting: Physical Medicine and Rehabilitation

## 2018-04-20 DIAGNOSIS — M5126 Other intervertebral disc displacement, lumbar region: Secondary | ICD-10-CM | POA: Diagnosis not present

## 2018-04-20 DIAGNOSIS — M48061 Spinal stenosis, lumbar region without neurogenic claudication: Secondary | ICD-10-CM

## 2018-04-20 MED ORDER — ONDANSETRON HCL 4 MG/2ML IJ SOLN
4.0000 mg | Freq: Once | INTRAMUSCULAR | Status: AC
  Administered 2018-04-20: 4 mg via INTRAMUSCULAR

## 2018-04-20 MED ORDER — MEPERIDINE HCL 100 MG/ML IJ SOLN
75.0000 mg | Freq: Once | INTRAMUSCULAR | Status: AC
  Administered 2018-04-20: 75 mg via INTRAMUSCULAR

## 2018-04-20 MED ORDER — IOPAMIDOL (ISOVUE-M 200) INJECTION 41%
15.0000 mL | Freq: Once | INTRAMUSCULAR | Status: AC
  Administered 2018-04-20: 15 mL via INTRATHECAL

## 2018-04-20 MED ORDER — DIAZEPAM 5 MG PO TABS
10.0000 mg | ORAL_TABLET | Freq: Once | ORAL | Status: AC
  Administered 2018-04-20: 10 mg via ORAL

## 2018-04-20 NOTE — Progress Notes (Signed)
Patient states she has been off Maxalt for at least the past two days.  Gypsy Lore, RN

## 2018-04-20 NOTE — Discharge Instructions (Signed)
Myelogram Discharge Instructions  1. Go home and rest quietly for the next 24 hours.  It is important to lie flat for the next 24 hours.  Get up only to go to the restroom.  You may lie in the bed or on a couch on your back, your stomach, your left side or your right side.  You may have one pillow under your head.  You may have pillows between your knees while you are on your side or under your knees while you are on your back.  2. DO NOT drive today.  Recline the seat as far back as it will go, while still wearing your seat belt, on the way home.  3. You may get up to go to the bathroom as needed.  You may sit up for 10 minutes to eat.  You may resume your normal diet and medications unless otherwise indicated.  Drink lots of extra fluids today and tomorrow.  4. The incidence of headache, nausea, or vomiting is about 5% (one in 20 patients).  If you develop a headache, lie flat and drink plenty of fluids until the headache goes away.  Caffeinated beverages may be helpful.  If you develop severe nausea and vomiting or a headache that does not go away with flat bed rest, call [phone].  5. You may resume normal activities after your 24 hours of bed rest is over; however, do not exert yourself strongly or do any heavy lifting tomorrow. If when you get up you have a headache when standing, go back to bed and force fluids for another 24 hours.  6. Call your physician for a follow-up appointment.  The results of your myelogram will be sent directly to your physician by the following day.  7. If you have any questions or if complications develop after you arrive home, please call [phone].  Discharge instructions have been explained to the patient.  The patient, or the person responsible for the patient, fully understands these instructions.  YOU MAY RESTART YOUR MAXALT AS NEEDED TOMORROW 04/21/2018 AT 1:00PM.

## 2018-05-03 DIAGNOSIS — G5621 Lesion of ulnar nerve, right upper limb: Secondary | ICD-10-CM | POA: Diagnosis not present

## 2018-05-03 DIAGNOSIS — R2232 Localized swelling, mass and lump, left upper limb: Secondary | ICD-10-CM | POA: Diagnosis not present

## 2018-05-03 DIAGNOSIS — G5623 Lesion of ulnar nerve, bilateral upper limbs: Secondary | ICD-10-CM | POA: Diagnosis not present

## 2018-05-03 DIAGNOSIS — G5601 Carpal tunnel syndrome, right upper limb: Secondary | ICD-10-CM | POA: Diagnosis not present

## 2018-05-03 DIAGNOSIS — G5603 Carpal tunnel syndrome, bilateral upper limbs: Secondary | ICD-10-CM | POA: Diagnosis not present

## 2018-05-03 DIAGNOSIS — G5602 Carpal tunnel syndrome, left upper limb: Secondary | ICD-10-CM | POA: Diagnosis not present

## 2018-05-03 DIAGNOSIS — G5622 Lesion of ulnar nerve, left upper limb: Secondary | ICD-10-CM | POA: Diagnosis not present

## 2018-05-04 ENCOUNTER — Other Ambulatory Visit: Payer: Self-pay

## 2018-05-04 ENCOUNTER — Encounter: Payer: Self-pay | Admitting: Emergency Medicine

## 2018-05-04 ENCOUNTER — Emergency Department: Payer: Medicare HMO

## 2018-05-04 ENCOUNTER — Emergency Department
Admission: EM | Admit: 2018-05-04 | Discharge: 2018-05-04 | Disposition: A | Discharge status: Home / Self Care | Payer: Medicare HMO | Attending: Emergency Medicine | Admitting: Emergency Medicine

## 2018-05-04 DIAGNOSIS — M5432 Sciatica, left side: Secondary | ICD-10-CM | POA: Diagnosis not present

## 2018-05-04 DIAGNOSIS — Z91040 Latex allergy status: Secondary | ICD-10-CM | POA: Insufficient documentation

## 2018-05-04 DIAGNOSIS — M25552 Pain in left hip: Secondary | ICD-10-CM | POA: Diagnosis not present

## 2018-05-04 DIAGNOSIS — M7989 Other specified soft tissue disorders: Secondary | ICD-10-CM | POA: Diagnosis not present

## 2018-05-04 DIAGNOSIS — M1612 Unilateral primary osteoarthritis, left hip: Secondary | ICD-10-CM | POA: Insufficient documentation

## 2018-05-04 DIAGNOSIS — M5442 Lumbago with sciatica, left side: Secondary | ICD-10-CM | POA: Diagnosis not present

## 2018-05-04 DIAGNOSIS — Z79899 Other long term (current) drug therapy: Secondary | ICD-10-CM | POA: Diagnosis not present

## 2018-05-04 DIAGNOSIS — R2242 Localized swelling, mass and lump, left lower limb: Secondary | ICD-10-CM | POA: Diagnosis present

## 2018-05-04 MED ORDER — NAPROXEN 500 MG PO TABS
500.0000 mg | ORAL_TABLET | Freq: Once | ORAL | Status: AC
  Administered 2018-05-04: 500 mg via ORAL
  Filled 2018-05-04 (×2): qty 1

## 2018-05-04 MED ORDER — NAPROXEN 500 MG PO TABS: 500.0000 mg | ORAL_TABLET | Freq: Two times a day (BID) | ORAL | 0 refills | Status: DC

## 2018-05-04 NOTE — ED Provider Notes (Signed)
[facility] Emergency Department Provider Note  ____________________________________________  Time seen: Approximately 4:55 PM  I have reviewed the triage vital signs and the nursing notes.   HISTORY  Chief Complaint Leg Pain and Leg Swelling    HPI Kimberly Cowan is a 52 y.o. female who complains of left leg pain from the left hip all the way down to the left foot.  Feels sharp.  Worse with movement and weightbearing, no alleviating factors.  Moderate intensity.  Intermittent and waxing and waning.  Has a history of DVT.  No recent travel trauma hospitalization or surgery.  No fever or chills.  No falls.  She also has chronic low back pain with a spinal stimulator in place.      Past Medical History:  Diagnosis Date  . Anxiety   . Arthritis    lower spine  . Chronic back pain   . Clinical depression 04/04/2012  . Depression   . DVT (deep venous thrombosis) ([place])   . Glaucoma    RIGHT EYE  . Headache(784.0)    migraines  . Ovarian cyst   . Pneumonia    walking pneumonia  . Sleep apnea    no cpcap     last sleep study > 4 yrs     Patient Active Problem List   Diagnosis Date Noted  . Encounter for screening colonoscopy   . Mild peripheral edema 11/15/2017  . Perforated ear drum, left 06/25/2017  . History of pulmonary embolism 06/10/2017  . Environmental and seasonal allergies 04/07/2017  . Major depressive disorder with single episode, in partial remission ([place]) 04/07/2017  . Tinea versicolor 02/03/2017  . Vasomotor symptoms due to menopause 11/11/2016  . Morbid obesity ([place]) 09/10/2016  . B12 nutritional deficiency 08/05/2016  . Obstructive sleep apnea of adult 03/04/2016  . Bilateral lumbar radiculopathy 08/14/2015  . Status post gastric bypass for obesity 07/04/2015  . Prediabetes 07/01/2015  . Headache, migraine 04/04/2012     Past Surgical History:  Procedure Laterality Date  . ABDOMINAL HYSTERECTOMY  10/2006  .  BACK SURGERY  2014   lumbar fusion  . BACK SURGERY  2009   discectomy  . COLONOSCOPY WITH PROPOFOL N/A 04/12/2018   Procedure: COLONOSCOPY WITH PROPOFOL;  Surgeon: Lin Landsman, MD;  Location: [facility] ENDOSCOPY;  Service: Gastroenterology;  Laterality: N/A;  . GASTRIC BYPASS  2005  . KNEE ARTHROSCOPY WITH LATERAL MENISECTOMY Right 05/27/2017   Procedure: KNEE ARTHROSCOPY WITH LATERAL MENISECTOMY;  Surgeon: Hessie Knows, MD;  Location: [facility] ORS;  Service: Orthopedics;  Laterality: Right;  . KNEE ARTHROSCOPY WITH LATERAL RELEASE Right 05/27/2017   Procedure: KNEE ARTHROSCOPY WITH LATERAL RELEASE;  Surgeon: Hessie Knows, MD;  Location: [facility] ORS;  Service: Orthopedics;  Laterality: Right;  . LAPAROSCOPIC SALPINGO OOPHERECTOMY Bilateral 10/05/2016   Procedure: LAPAROSCOPIC SALPINGO OOPHORECTOMY;  Surgeon: Brayton Mars, MD;  Location: [facility] ORS;  Service: Gynecology;  Laterality: Bilateral;  . REDUCTION MAMMAPLASTY Bilateral    2001  . SPINAL CORD STIMULATOR INSERTION N/A 03/20/2016   Procedure: LUMBAR SPINAL CORD STIMULATOR INSERTION;  Surgeon: Eustace Moore, MD;  Location: [facility]URO ORS;  Service: Neurosurgery;  Laterality: N/A;  . TONSILLECTOMY       Prior to Admission medications   Medication Sig Start Date End Date Taking? Authorizing Provider  BD INTEGRA SYRINGE 25G X 1" 3 ML MISC USE AS DIRECTED 11/07/17   Glean Hess, MD  Cetirizine HCl 10 MG CAPS Take 1 capsule (10 mg total) by  mouth daily. Patient taking differently: Take 10 mg by mouth at bedtime.  04/07/17   Glean Hess, MD  cyclobenzaprine (FLEXERIL) 10 MG tablet Take 1 tablet by mouth 3 (three) times daily. 12/28/17   [provider]  ferrous sulfate 325 (65 FE) MG EC tablet Take 325 mg by mouth 3 (three) times daily with meals.    [provider]  fluticasone Asencion Islam) 50 MCG/ACT nasal spray USE 2 SPRAYS IN [facility] NOSTRIL DAILY 09/29/17   Glean Hess, MD  gabapentin (NEURONTIN) 300 MG capsule  Take 600 mg by mouth at bedtime.  01/01/16   [provider]  ketoconazole (NIZORAL) 2 % shampoo Apply 1 application topically daily as needed (for skin fungus on neck/back). 11/15/17   Glean Hess, MD  latanoprost (XALATAN) 0.005 % ophthalmic solution Place 1 drop into both eyes at bedtime.     [provider]  naproxen (NAPROSYN) 500 MG tablet Take 1 tablet (500 mg total) by mouth 2 (two) times daily with a meal. 05/04/18   Carrie Mew, MD  oxyCODONE-acetaminophen (PERCOCET/ROXICET) 5-325 MG tablet Take 1 tablet by mouth daily. 12/15/17   [provider]  predniSONE (DELTASONE) 10 MG tablet Take 6 on day 1, 5 on day 2, 4 on day 3, 3 on day 4, 2 on day 5 and 1 on day 1 then stop. 04/19/18   Glean Hess, MD  rizatriptan (MAXALT-MLT) 10 MG disintegrating tablet DISSOLVE ONE TABLET BY MOUTH AS NEEDED FOR MIGRAINE. MAY REPEAT IN 2 HOURS IF NEEDED 07/02/17   Glean Hess, MD  triamterene-hydrochlorothiazide (KGURKYH-06) 37.5-25 MG tablet TAKE 1 TABLET BY MOUTH EVERY DAY AS NEEDED 10/29/17   Glean Hess, MD     Allergies Morphine and related; Penicillins; Latex; and Baclofen   Family History  Problem Relation Age of Onset  . Diabetes Mother   . Hypertension Mother   . Diabetes Sister   . Hypertension Sister   . Hypertension Father   . Breast cancer Neg Hx     Social History Social History   Tobacco Use  . Smoking status: Never Smoker  . Smokeless tobacco: Never Used  . Tobacco comment: smoking cessation materials not required  Substance Use Topics  . Alcohol use: No    Alcohol/week: 0.0 oz  . Drug use: No    Review of Systems  Constitutional:   No fever or chills.  Cardiovascular:   No chest pain or syncope. Respiratory:   No dyspnea or cough. Gastrointestinal:   Negative for abdominal pain, vomiting and diarrhea.  Musculoskeletal:   Chronic low back pain.  Left leg pain as above.   All other systems reviewed and are negative  except as documented above in ROS and HPI.  ____________________________________________   PHYSICAL EXAM:  VITAL SIGNS: ED Triage Vitals  Enc Vitals Group     BP 05/04/18 1404 131/75     Pulse Rate 05/04/18 1404 98     Resp 05/04/18 1404 20     Temp 05/04/18 1404 98.8 F (37.1 C)     Temp Source 05/04/18 1404 Oral     SpO2 05/04/18 1404 98 %     Weight 05/04/18 1404 (!) 337 lb (152.9 kg)     Height 05/04/18 1404 5' 7.5" (1.715 m)     Head Circumference --      Peak Flow --      Pain Score 05/04/18 1407 7     Pain Loc --  Pain Edu? --      Excl. in [place]? --     Vital signs reviewed, nursing assessments reviewed.   Constitutional:   Alert and oriented. Non-toxic appearance. Eyes:   Conjunctivae are normal. EOMI. PERRL. ENT      Head:   Normocephalic and atraumatic.         Mouth/Throat:   MMM, no pharyngeal erythema. No peritonsillar mass.       Neck:   No meningismus. Full ROM. Hematological/Lymphatic/Immunilogical:   No cervical lymphadenopathy. Cardiovascular:   RRR. Symmetric bilateral radial and DP pulses.  No murmurs. Cap refill less than 2 seconds. Respiratory:   Normal respiratory effort without tachypnea/retractions. Breath sounds are clear and equal bilaterally. No wheezes/rales/rhonchi. Gastrointestinal:   Soft and nontender. Non distended. There is no CVA tenderness.  No rebound, rigidity, or guarding. Musculoskeletal:   Pain limited range of motion of the left hip.  Symmetric calf circumference.  No calf tenderness or palpable cords.  No focal bony tenderness or soft tissue tenderness.  There is pain with flexion of the left hip but she is able to tolerate range of motion.  No midline spinal tenderness. Neurologic:   Normal speech and language.  Motor grossly intact. No acute focal neurologic deficits are appreciated.  Skin:    Skin is warm, dry and intact. No rash noted.  No petechiae, purpura, or bullae.  No inflammatory changes or  crepitus  ____________________________________________    LABS (pertinent positives/negatives) (all labs ordered are listed, but only abnormal results are displayed) Labs Reviewed - No data to display ____________________________________________   EKG    ____________________________________________    RADIOLOGY  US Venous Img Lower Unilateral Left  Result Date: 05/04/2018 CLINICAL DATA:  52 year old female with a history of leg and foot swelling EXAM: LEFT LOWER EXTREMITY VENOUS DOPPLER ULTRASOUND TECHNIQUE: Gray-scale sonography with graded compression, as well as color Doppler and duplex ultrasound were performed to evaluate the lower extremity deep venous systems from the level of the common femoral vein and including the common femoral, femoral, profunda femoral, popliteal and calf veins including the posterior tibial, peroneal and gastrocnemius veins when visible. The superficial great saphenous vein was also interrogated. Spectral Doppler was utilized to evaluate flow at rest and with distal augmentation maneuvers in the common femoral, femoral and popliteal veins. COMPARISON:  None. FINDINGS: Contralateral Common Femoral Vein: Respiratory phasicity is normal and symmetric with the symptomatic side. No evidence of thrombus. Normal compressibility. Common Femoral Vein: No evidence of thrombus. Normal compressibility, respiratory phasicity and response to augmentation. Saphenofemoral Junction: No evidence of thrombus. Normal compressibility and flow on color Doppler imaging. Profunda Femoral Vein: No evidence of thrombus. Normal compressibility and flow on color Doppler imaging. Femoral Vein: No evidence of thrombus. Normal compressibility, respiratory phasicity and response to augmentation. Popliteal Vein: No evidence of thrombus. Normal compressibility, respiratory phasicity and response to augmentation. Calf Veins: No evidence of thrombus. Normal compressibility and flow on color Doppler  imaging. Superficial Great Saphenous Vein: No evidence of thrombus. Normal compressibility and flow on color Doppler imaging. Other Findings:  None. IMPRESSION: Sonographic survey of the left lower extremity negative for DVT Electronically Signed   By: Corrie Mckusick D.O.   On: 05/04/2018 15:44   Dg Hip Unilat W Or Wo Pelvis 2-3 Views Left  Result Date: 05/04/2018 CLINICAL DATA:  Left hip pain.  Painful range of motion. EXAM: DG HIP (WITH OR WITHOUT PELVIS) 2-3V LEFT COMPARISON:  Pelvic radiograph dated 02/05/2018 FINDINGS: There is no  evidence of hip fracture or dislocation. There is no evidence of arthropathy or other focal bone abnormality. Previous lumbar fusion at L5-S1. IMPRESSION: Normal left hip. Electronically Signed   By: Lorriane Shire M.D.   On: 05/04/2018 16:26    ____________________________________________   PROCEDURES Procedures  ____________________________________________    CLINICAL IMPRESSION / ASSESSMENT AND PLAN / ED COURSE  Pertinent labs & imaging results that were available during my care of the patient were reviewed by me and considered in my medical decision making (see chart for details).    Patient presents with left hip pain radiating down to the foot both on the lateral and posterior aspects of the leg.  Think this is consistent with sciatica related to her chronic back pain.  No neurologic impairment.  Good strength, no incontinence.  Ultrasound negative for DVT.  Think she is otherwise low risk for DVT at this point.  X-ray negative for fracture.  I suspect her symptoms are due to arthritis which can be treated with NSAIDs and follow-up with primary care and orthopedics.  She may need PT referral given her morbid obesity.      ____________________________________________   FINAL CLINICAL IMPRESSION(S) / ED DIAGNOSES    Final diagnoses:  Arthritis of left hip  Sciatica of left side     ED Discharge Orders        Ordered    naproxen (NAPROSYN)  500 MG tablet  2 times daily with meals     05/04/18 1655      Portions of this note were generated with dragon dictation software. Dictation errors may occur despite best attempts at proofreading.    Carrie Mew, MD 05/04/18 1700

## 2018-05-04 NOTE — ED Triage Notes (Signed)
Pt states she has a hx of blood clot, concern for pain and swelling in lower leg and foot, wants to be checked for clot. Walked with limp to triage.

## 2018-05-04 NOTE — ED Notes (Signed)
Patient transported to Ultrasound 

## 2018-05-05 DIAGNOSIS — G540 Brachial plexus disorders: Secondary | ICD-10-CM | POA: Diagnosis not present

## 2018-05-05 DIAGNOSIS — G5602 Carpal tunnel syndrome, left upper limb: Secondary | ICD-10-CM | POA: Diagnosis not present

## 2018-05-09 DIAGNOSIS — M7582 Other shoulder lesions, left shoulder: Secondary | ICD-10-CM | POA: Diagnosis not present

## 2018-05-09 DIAGNOSIS — M19012 Primary osteoarthritis, left shoulder: Secondary | ICD-10-CM | POA: Diagnosis not present

## 2018-05-09 DIAGNOSIS — M25512 Pain in left shoulder: Secondary | ICD-10-CM | POA: Diagnosis not present

## 2018-05-10 DIAGNOSIS — G5602 Carpal tunnel syndrome, left upper limb: Secondary | ICD-10-CM | POA: Diagnosis not present

## 2018-05-11 DIAGNOSIS — Z6841 Body Mass Index (BMI) 40.0 and over, adult: Secondary | ICD-10-CM | POA: Diagnosis not present

## 2018-05-11 DIAGNOSIS — M4807 Spinal stenosis, lumbosacral region: Secondary | ICD-10-CM | POA: Diagnosis not present

## 2018-05-11 DIAGNOSIS — M7062 Trochanteric bursitis, left hip: Secondary | ICD-10-CM | POA: Diagnosis not present

## 2018-05-17 ENCOUNTER — Ambulatory Visit (INDEPENDENT_AMBULATORY_CARE_PROVIDER_SITE_OTHER): Payer: Medicare HMO | Admitting: Vascular Surgery

## 2018-05-17 ENCOUNTER — Encounter (INDEPENDENT_AMBULATORY_CARE_PROVIDER_SITE_OTHER): Payer: Self-pay | Admitting: Vascular Surgery

## 2018-05-17 VITALS — BP 138/89 | HR 90 | Resp 17 | Ht 67.0 in | Wt 343.0 lb

## 2018-05-17 DIAGNOSIS — R7303 Prediabetes: Secondary | ICD-10-CM

## 2018-05-17 DIAGNOSIS — M79602 Pain in left arm: Secondary | ICD-10-CM

## 2018-05-17 DIAGNOSIS — M5416 Radiculopathy, lumbar region: Secondary | ICD-10-CM | POA: Diagnosis not present

## 2018-05-17 HISTORY — DX: Pain in left arm: M79.602

## 2018-05-17 NOTE — Progress Notes (Signed)
Patient ID: Kimberly Cowan, female   DOB: [birth year], 52 y.o.   MRN: [id number]  Chief Complaint  Patient presents with  . New Patient (Initial Visit)    Thoracic outlet syndrome    HPI Kimberly Cowan is a 52 y.o. female.  I am asked to see the patient by Dr. Peggye Ley for evaluation of left arm thoracic outlet syndrome.  The patient reports about 10 years of coolness of the left arm and hand as well as numbness and tingling.  Over the past several weeks, the left shoulder and upper arm have been very tender to touch.  This is a new finding without clear inciting event or causative factor.  Nothing has made it better. No right arm symptoms.  She is also dealing with a lot of back and hip problems.  No history of ulceration in the left arm.  She does feel some weakness in the left arm.   Past Medical History:  Diagnosis Date  . Anxiety   . Arthritis    lower spine  . Chronic back pain   . Clinical depression 04/04/2012  . Depression   . DVT (deep venous thrombosis) ([place])   . Glaucoma    RIGHT EYE  . Headache(784.0)    migraines  . Ovarian cyst   . Pneumonia    walking pneumonia  . Sleep apnea    no cpcap     last sleep study > 4 yrs    Past Surgical History:  Procedure Laterality Date  . ABDOMINAL HYSTERECTOMY  10/2006  . BACK SURGERY  2014   lumbar fusion  . BACK SURGERY  2009   discectomy  . COLONOSCOPY WITH PROPOFOL N/A 04/12/2018   Procedure: COLONOSCOPY WITH PROPOFOL;  Surgeon: Lin Landsman, MD;  Location: [facility] ENDOSCOPY;  Service: Gastroenterology;  Laterality: N/A;  . GASTRIC BYPASS  2005  . KNEE ARTHROSCOPY WITH LATERAL MENISECTOMY Right 05/27/2017   Procedure: KNEE ARTHROSCOPY WITH LATERAL MENISECTOMY;  Surgeon: Hessie Knows, MD;  Location: [facility] ORS;  Service: Orthopedics;  Laterality: Right;  . KNEE ARTHROSCOPY WITH LATERAL RELEASE Right 05/27/2017   Procedure: KNEE ARTHROSCOPY WITH LATERAL RELEASE;  Surgeon: Hessie Knows, MD;   Location: [facility] ORS;  Service: Orthopedics;  Laterality: Right;  . LAPAROSCOPIC SALPINGO OOPHERECTOMY Bilateral 10/05/2016   Procedure: LAPAROSCOPIC SALPINGO OOPHORECTOMY;  Surgeon: Brayton Mars, MD;  Location: [facility] ORS;  Service: Gynecology;  Laterality: Bilateral;  . REDUCTION MAMMAPLASTY Bilateral    2001  . SPINAL CORD STIMULATOR INSERTION N/A 03/20/2016   Procedure: LUMBAR SPINAL CORD STIMULATOR INSERTION;  Surgeon: Eustace Moore, MD;  Location: [facility] ORS;  Service: Neurosurgery;  Laterality: N/A;  . TONSILLECTOMY      Family History  Problem Relation Age of Onset  . Diabetes Mother   . Hypertension Mother   . Diabetes Sister   . Hypertension Sister   . Hypertension Father   . Breast cancer Neg Hx      Social History Social History   Tobacco Use  . Smoking status: Never Smoker  . Smokeless tobacco: Never Used  . Tobacco comment: smoking cessation materials not required  Substance Use Topics  . Alcohol use: No    Alcohol/week: 0.0 standard drinks  . Drug use: No     Allergies  Allergen Reactions  . Morphine And Related Hives, Shortness Of Breath, Nausea And Vomiting and Other (See Comments)    "can't breathe"  . Penicillins Anaphylaxis, Hives, Shortness Of Breath and Nausea And  Vomiting    "can't breathe", Has patient had a PCN reaction causing immediate rash, facial/tongue/throat swelling, SOB or lightheadedness with hypotension: Yes Has patient had a PCN reaction causing severe rash involving mucus membranes or skin necrosis: No Has patient had a PCN reaction that required hospitalization No Has patient had a PCN reaction occurring within the last 10 years: No If all of the above answers are "NO", then may proceed with Cephalosporin use.   . Latex Hives  . Baclofen Nausea Only    Jittery, anxious    Current Outpatient Medications  Medication Sig Dispense Refill  . Cetirizine HCl 10 MG CAPS Take 1 capsule (10 mg total) by mouth daily. (Patient taking  differently: Take 10 mg by mouth at bedtime. ) 30 capsule 12  . cyclobenzaprine (FLEXERIL) 10 MG tablet Take 1 tablet by mouth 3 (three) times daily.    . fluticasone (FLONASE) 50 MCG/ACT nasal spray USE 2 SPRAYS IN EACH NOSTRIL DAILY 16 g 5  . gabapentin (NEURONTIN) 300 MG capsule Take 600 mg by mouth at bedtime.   0  . ketoconazole (NIZORAL) 2 % shampoo Apply 1 application topically daily as needed (for skin fungus on neck/back). 120 mL 1  . latanoprost (XALATAN) 0.005 % ophthalmic solution Place 1 drop into both eyes at bedtime.     . naproxen (NAPROSYN) 500 MG tablet Take 1 tablet (500 mg total) by mouth 2 (two) times daily with a meal. 20 tablet 0  . oxyCODONE-acetaminophen (PERCOCET/ROXICET) 5-325 MG tablet Take 1 tablet by mouth daily.  0  . rizatriptan (MAXALT-MLT) 10 MG disintegrating tablet DISSOLVE ONE TABLET BY MOUTH AS NEEDED FOR MIGRAINE. MAY REPEAT IN 2 HOURS IF NEEDED 10 tablet 5  . triamterene-hydrochlorothiazide (MAXZIDE-25) 37.5-25 MG tablet TAKE 1 TABLET BY MOUTH EVERY DAY AS NEEDED 30 tablet 5  . BD INTEGRA SYRINGE 25G X 1" 3 ML MISC USE AS DIRECTED 50 each 12  . ferrous sulfate 325 (65 FE) MG EC tablet Take 325 mg by mouth 3 (three) times daily with meals.    . predniSONE (DELTASONE) 10 MG tablet Take 6 on day 1, 5 on day 2, 4 on day 3, 3 on day 4, 2 on day 5 and 1 on day 1 then stop. (Patient not taking: Reported on 05/17/2018) 21 tablet 0   No current facility-administered medications for this visit.       REVIEW OF SYSTEMS (Negative unless checked)  Constitutional: [] Weight loss  [] Fever  [] Chills Cardiac: [] Chest pain   [] Chest pressure   [] Palpitations   [] Shortness of breath when laying flat   [] Shortness of breath at rest   [x] Shortness of breath with exertion. Vascular:  [] Pain in legs with walking   [] Pain in legs at rest   [] Pain in legs when laying flat   [] Claudication   [] Pain in feet when walking  [] Pain in feet at rest  [] Pain in feet when laying flat    [] History of DVT   [] Phlebitis   [x] Swelling in legs   [] Varicose veins   [] Non-healing ulcers Pulmonary:   [] Uses home oxygen   [] Productive cough   [] Hemoptysis   [] Wheeze  [] COPD   [] Asthma Neurologic:  [] Dizziness  [] Blackouts   [] Seizures   [] History of stroke   [] History of TIA  [] Aphasia   [] Temporary blindness   [] Dysphagia   [] Weakness or numbness in arms   [] Weakness or numbness in legs Musculoskeletal:  [x] Arthritis   [] Joint swelling   [x] Joint pain   [  x]Low back pain Hematologic:  [] Easy bruising  [] Easy bleeding   [] Hypercoagulable state   [] Anemic  [] Hepatitis Gastrointestinal:  [] Blood in stool   [] Vomiting blood  [] Gastroesophageal reflux/heartburn   [] Abdominal pain Genitourinary:  [] Chronic kidney disease   [] Difficult urination  [] Frequent urination  [] Burning with urination   [] Hematuria Skin:  [] Rashes   [] Ulcers   [] Wounds Psychological:  [] History of anxiety   []  History of major depression.    Physical Exam BP 138/89   Pulse 90   Resp 17   Ht 5\' 7"  (1.702 m)   Wt (!) 343 lb (155.6 kg)   BMI 53.72 kg/m  Gen:  WD/WN, NAD. Morbidly obese Head: [place]/AT, No temporalis wasting.  Ear/Nose/Throat: Hearing grossly intact, nares w/o erythema or drainage, oropharynx w/o Erythema/Exudate Eyes: Conjunctiva clear, sclera non-icteric  Neck: trachea midline.  Pulmonary:  Good air movement, respirations not labored, no use of accessory muscles Cardiac: RRR, no JVD Vascular:  Vessel Right Left  Radial Palpable 1+ Palpable                                   Gastrointestinal: soft, non-tender/non-distended.  Musculoskeletal: M/S 5/5 throughout.  Extremities without ischemic changes.  No deformity or atrophy. 1+ BLE edema. Neurologic: Sensation grossly intact in extremities.  Symmetrical.  Speech is fluent. Motor exam as listed above. Psychiatric: Judgment intact, Mood & affect appropriate for pt's clinical situation. Dermatologic: No rashes or ulcers noted.  No cellulitis  or open wounds.    Radiology Ct Lumbar Spine W Contrast  Result Date: 04/20/2018 CLINICAL DATA:  Frequent falls. Left leg gives out. Low back pain extending into the lower extremities along the S1 pathway bilaterally. EXAM: LUMBAR MYELOGRAM FLUOROSCOPY TIME:  di Radiation Exposure Index (as provided by the fluoroscopic device): 412.35 uGy*m2 Fluoroscopy Time:  31 seconds Number of Acquired Images:  17 minutes and seconds PROCEDURE: After thorough discussion of risks and benefits of the procedure including bleeding, infection, injury to nerves, blood vessels, adjacent structures as well as headache and CSF leak, written and oral informed consent was obtained. Consent was obtained by Dr. San Morelle. Time out form was completed. Patient was positioned prone on the fluoroscopy table. Local anesthesia was provided with 1% lidocaine without epinephrine after prepped and draped in the usual sterile fashion. Puncture was performed at L1-2 using a 3 1/2 inch 22-gauge spinal needle via right paramedian approach. Using a single pass through the dura, the needle was placed within the thecal sac, with return of clear CSF. 15 mL of Isovue M-200 was injected into the thecal sac, with normal opacification of the nerve roots and cauda equina consistent with free flow within the subarachnoid space. I personally performed the lumbar puncture and administered the intrathecal contrast. I also personally supervised acquisition of the myelogram images. TECHNIQUE: Contiguous axial images were obtained through the Lumbar spine after the intrathecal infusion of infusion. Coronal and sagittal reconstructions were obtained of the axial image sets. COMPARISON:  None FINDINGS: LUMBAR MYELOGRAM FINDINGS: Five non rib-bearing lumbar type vertebral bodies are present. Posterior pedicle screw and rod fixation is present at L5-S1. AP alignment is anatomic. Adjacent level disease is present at L4-5 with mild broad-based disc  protrusion. Nerve roots fill normally in the lumbar spine bilaterally. L4-5 disc protrusion is exaggerated with standing. Slight anterolisthesis present with standing. This does not change significantly with flexion or extension. There is mild disc bulging at  L3-4. CT LUMBAR MYELOGRAM FINDINGS: The lumbar spine is imaged from the midbody of T10 through the midbody of S3. AP alignment is anatomic. Conus medullaris terminates at T12-L1. Dorsal spinal cord stimulator enters at the T9-10 level. Facet hypertrophy contributes to mild central canal narrowing and bilateral foraminal narrowing at T10-11. This is worse on the left. T12-L1: Negative. L1-2: Negative. L2-3: Negative. L3-4: Negative. L4-5: A mild broad-based disc protrusion is present. Moderate facet hypertrophy is noted. This leads to mild to moderate foraminal stenosis bilaterally, right greater than left. L5-S1: Wide laminectomy is noted. The central canal is decompressed. Moderate left and mild right residual osseous foraminal narrowing is present. IMPRESSION: 1. Lumbar fusion and laminectomy at L5-S1 with decompression of the central canal. 2. Residual moderate foraminal stenosis at L5-S1 is worse on the left. 3. Adjacent level disease with a broad-based disc protrusion and moderate facet hypertrophy at L4-5. The disc protrusion is exaggerated with standing. 4. Mild to moderate foraminal narrowing bilaterally at L4-5 is worse on the right. Electronically Signed   By: San Morelle M.D.   On: 04/20/2018 15:31   US Venous Img Lower Unilateral Left  Result Date: 05/04/2018 CLINICAL DATA:  52 year old female with a history of leg and foot swelling EXAM: LEFT LOWER EXTREMITY VENOUS DOPPLER ULTRASOUND TECHNIQUE: Gray-scale sonography with graded compression, as well as color Doppler and duplex ultrasound were performed to evaluate the lower extremity deep venous systems from the level of the common femoral vein and including the common femoral, femoral,  profunda femoral, popliteal and calf veins including the posterior tibial, peroneal and gastrocnemius veins when visible. The superficial great saphenous vein was also interrogated. Spectral Doppler was utilized to evaluate flow at rest and with distal augmentation maneuvers in the common femoral, femoral and popliteal veins. COMPARISON:  None. FINDINGS: Contralateral Common Femoral Vein: Respiratory phasicity is normal and symmetric with the symptomatic side. No evidence of thrombus. Normal compressibility. Common Femoral Vein: No evidence of thrombus. Normal compressibility, respiratory phasicity and response to augmentation. Saphenofemoral Junction: No evidence of thrombus. Normal compressibility and flow on color Doppler imaging. Profunda Femoral Vein: No evidence of thrombus. Normal compressibility and flow on color Doppler imaging. Femoral Vein: No evidence of thrombus. Normal compressibility, respiratory phasicity and response to augmentation. Popliteal Vein: No evidence of thrombus. Normal compressibility, respiratory phasicity and response to augmentation. Calf Veins: No evidence of thrombus. Normal compressibility and flow on color Doppler imaging. Superficial Great Saphenous Vein: No evidence of thrombus. Normal compressibility and flow on color Doppler imaging. Other Findings:  None. IMPRESSION: Sonographic survey of the left lower extremity negative for DVT Electronically Signed   By: Corrie Mckusick D.O.   On: 05/04/2018 15:44   Dg Myelography Lumbar Inj Lumbosacral  Result Date: 04/20/2018 CLINICAL DATA:  Frequent falls. Left leg gives out. Low back pain extending into the lower extremities along the S1 pathway bilaterally. EXAM: LUMBAR MYELOGRAM FLUOROSCOPY TIME:  di Radiation Exposure Index (as provided by the fluoroscopic device): 412.35 uGy*m2 Fluoroscopy Time:  31 seconds Number of Acquired Images:  17 minutes and seconds PROCEDURE: After thorough discussion of risks and benefits of the  procedure including bleeding, infection, injury to nerves, blood vessels, adjacent structures as well as headache and CSF leak, written and oral informed consent was obtained. Consent was obtained by Dr. San Morelle. Time out form was completed. Patient was positioned prone on the fluoroscopy table. Local anesthesia was provided with 1% lidocaine without epinephrine after prepped and draped in the usual sterile fashion.  Puncture was performed at L1-2 using a 3 1/2 inch 22-gauge spinal needle via right paramedian approach. Using a single pass through the dura, the needle was placed within the thecal sac, with return of clear CSF. 15 mL of Isovue M-200 was injected into the thecal sac, with normal opacification of the nerve roots and cauda equina consistent with free flow within the subarachnoid space. I personally performed the lumbar puncture and administered the intrathecal contrast. I also personally supervised acquisition of the myelogram images. TECHNIQUE: Contiguous axial images were obtained through the Lumbar spine after the intrathecal infusion of infusion. Coronal and sagittal reconstructions were obtained of the axial image sets. COMPARISON:  None FINDINGS: LUMBAR MYELOGRAM FINDINGS: Five non rib-bearing lumbar type vertebral bodies are present. Posterior pedicle screw and rod fixation is present at L5-S1. AP alignment is anatomic. Adjacent level disease is present at L4-5 with mild broad-based disc protrusion. Nerve roots fill normally in the lumbar spine bilaterally. L4-5 disc protrusion is exaggerated with standing. Slight anterolisthesis present with standing. This does not change significantly with flexion or extension. There is mild disc bulging at L3-4. CT LUMBAR MYELOGRAM FINDINGS: The lumbar spine is imaged from the midbody of T10 through the midbody of S3. AP alignment is anatomic. Conus medullaris terminates at T12-L1. Dorsal spinal cord stimulator enters at the T9-10 level. Facet  hypertrophy contributes to mild central canal narrowing and bilateral foraminal narrowing at T10-11. This is worse on the left. T12-L1: Negative. L1-2: Negative. L2-3: Negative. L3-4: Negative. L4-5: A mild broad-based disc protrusion is present. Moderate facet hypertrophy is noted. This leads to mild to moderate foraminal stenosis bilaterally, right greater than left. L5-S1: Wide laminectomy is noted. The central canal is decompressed. Moderate left and mild right residual osseous foraminal narrowing is present. IMPRESSION: 1. Lumbar fusion and laminectomy at L5-S1 with decompression of the central canal. 2. Residual moderate foraminal stenosis at L5-S1 is worse on the left. 3. Adjacent level disease with a broad-based disc protrusion and moderate facet hypertrophy at L4-5. The disc protrusion is exaggerated with standing. 4. Mild to moderate foraminal narrowing bilaterally at L4-5 is worse on the right. Electronically Signed   By: San Morelle M.D.   On: 04/20/2018 15:31   Dg Hip Unilat W Or Wo Pelvis 2-3 Views Left  Result Date: 05/04/2018 CLINICAL DATA:  Left hip pain.  Painful range of motion. EXAM: DG HIP (WITH OR WITHOUT PELVIS) 2-3V LEFT COMPARISON:  Pelvic radiograph dated 02/05/2018 FINDINGS: There is no evidence of hip fracture or dislocation. There is no evidence of arthropathy or other focal bone abnormality. Previous lumbar fusion at L5-S1. IMPRESSION: Normal left hip. Electronically Signed   By: Lorriane Shire M.D.   On: 05/04/2018 16:26    Labs No results found for this or any previous visit (from the past 2160 hour(s)).  Assessment/Plan:  Morbid obesity ([place]) Would increase risk of thoracic outlet as well as joint issues  Bilateral lumbar radiculopathy Now seeing neurosurgery  Prediabetes blood glucose control important in reducing the progression of atherosclerotic disease. Also, involved in wound healing   Arm pain, left The patient has nonspecific left arm symptoms  that are not entirely clear in their etiology.  We had a long discussion today regarding the pathophysiology and natural history of atherosclerosis as well as thoracic outlet syndrome.  We discussed the differences between arterial, venous, and neurogenic thoracic outlet symptoms and that there are treatments are vastly different.  I am going to perform a left upper extremity  arterial duplex to assess for atherosclerotic disease as well as potential arterial thoracic outlet syndrome although I think that is less likely.  I discussed that neurogenic thoracic outlet syndrome is generally treated with nonsurgical therapies and this is very clearly a possibility as well.  These include physical therapy, weight loss, postural modifications, and referral to a pain clinic.  I will see her back following her noninvasive study to discuss the results and determine further treatment options.      Leotis Pain 05/17/2018, 10:45 AM   This note was created with Dragon medical transcription system.  Any errors from dictation are unintentional.

## 2018-05-17 NOTE — Patient Instructions (Signed)
Thoracic Outlet Syndrome Rehab Ask your health care provider which exercises are safe for you. Do exercises exactly as told by your health care provider and adjust them as directed. It is normal to feel mild stretching, pulling, tightness, or discomfort as you do these exercises, but you should stop right away if you feel sudden pain or your pain gets worse.Do not begin these exercises until told by your health care provider. Stretching and range of motion exercises These exercises warm up your muscles and joints and improve the movement and flexibility of your shoulder. These exercises also help to relieve pain, numbness, and tingling. Exercise A: Gentle chest stretch with "belly" breathing 1. On the floor, place a half foam roller or a bath towel that is rolled up lengthwise. 2. Lie on your back so your spine-all the way from your head to your tailbone-is on top of the foam roller or towel. Relax. You should feel a gentle stretch across your upper chest. ? Keep both feet or heels firmly on the floor. ? You may bend your knees for comfort. ? Let your arms fall naturally to your sides. 3. Breathe deeply and slowly from your belly. You should feel your belly rise each time you breathe in (inhale). Breathe out (exhale) completely before you inhale again. 4. Stay in this position and continue to inhale and exhale for __________ seconds. ? Over time, gradually increase the amount of time that you hold this stretch, as told by your health care provider. Repeat __________ times. Complete this exercise __________ times a day. Exercise B: Scalene stretches There are 3 types of scalene stretches. They vary depending on which direction you are looking while you do the exercise. Your health care provider will tell you which type of scalene stretch to do. 1. Lie on your back. Place the hand from your left / right side under the hip of your left / right side. For example, if your right shoulder is injured, place  your right hand under your right hip. 2. Gently and slowly tilt your head toward your healthy shoulder. Follow instructions from your health care provider about which direction you should turn your face while you tilt your head: ? Scalenus posterior stretch: Turn your face toward your healthy shoulder, in the same direction that you tilt your head. ? Scalenus medius stretch: Turn your face toward the ceiling while you tilt your head toward your healthy shoulder. ? Scalenus anterior stretch: Turn your face toward your injured shoulder. This means that you tilt your head one direction (toward your healthy shoulder), but you turn your face to the opposite direction. 3. Hold each stretch for __________ seconds. Repeat __________ times. Complete this exercise __________ times a day. Exercise C: Chin tuck ( axial extension) 1. Using good posture, sit on a stable surface or stand up. If you have trouble keeping good posture, rest your back and head against a stable wall during this exercise. 2. Look straight ahead and slowly move your chin back, toward your neck, until you feel a stretch in the back of your head. ? Your head should slide back. ? Your chin should be slightly lowered. 3. Hold for __________ seconds. 4. Return to the starting position. Repeat __________ times. Complete this exercise __________ times a day. Exercise D: Shoulder rolls  1. Sit in a sturdy chair or stand. Keep good posture during the exercise. 2. Shrug your shoulders up toward your ears and gently move your shoulders around in circles going forward. 3.  Reverse the direction of your shoulder rolls so you are moving your shoulders around in circles going backward. Repeat __________ times. Complete this exercise __________ times a day. Exercise E: Chest stretch ( external rotation and abduction) 1. Stand in a doorway with one of your feet slightly in front of the other. This is called a staggered stance. If you cannot reach  your forearms to the door frame, do this exercise in a corner of a room. 2. Choose one of the following positions as told by your health care provider: ? Place your hands and forearms on the door frame above your head. ? Place your hands and forearms on the door frame at the height of your head. ? Place your hands on the door frame at the height of your elbows. 3. Slowly move your weight onto your front foot until you feel a stretch across your chest and in the front of your shoulders. Keep your head and chest upright and keep your abdominal muscles tight. 4. Hold for __________ seconds. 5. To release the stretch, shift your weight to your back foot. Repeat __________ times. Complete this stretch __________ times a day Strengthening exercise This exercise builds strength and endurance in your shoulder. Endurance is the ability to use your muscles for a long time, even after they get tired. Exercise F: Scapular retraction  1. Sit in a stable chair without armrests, or stand. 2. Secure an exercise band to a stable object in front of you so the band is at shoulder height. 3. Hold one end of the band in each hand. Your palms should face down. 4. Straighten your elbows and lift your arms up to shoulder height. 5. Step back, away from the secured end of the band, until the band has no slack. 6. Squeeze your shoulder blades together and pull your elbows back behind you. ? Your elbows should be at about chest or shoulder height. ? Keep your upper arms lifted, away from your sides. 7. Hold for __________ seconds. 8. Slowly return to the starting position. Repeat __________ times. Complete this exercise __________ times a day. Posture and body mechanics Body mechanics refers to the movements and positions of your body while you do your daily activities. Posture is part of body mechanics. Good posture and healthy body mechanics can help to relieve stress in your body's tissues and joints. Good posture  means that your spine is in its natural S-curve position (your spine is neutral), your shoulders are pulled back slightly, and your head is not tipped forward. The following are general guidelines for applying improved posture and body mechanics to your everyday activities. Standing   When standing, keep your spine neutral and your feet about hip-width apart. Keep a slight bend in your knees. Your ears, shoulders, and hips should line up with each other.  When you do a task in which you stand in one place for a long time, place one foot up on a stable object that is 2-4 inches (5-10 cm) high, such as a footstool. This helps keep your spine neutral. Sitting   When sitting, keep your spine neutral and your feet flat on the floor. Use a footrest, if necessary, and keep your thighs parallel to the floor.Avoid rounding your shoulders, and avoid tilting your head forward.  When working at a desk or a computer, keep your desk at a height where your hands are slightly lower than your elbows. Slide your chair under your desk so you are close enough  to maintain good posture.  When working at a computer, place your monitor at a height and where you are looking straight ahead and you do not have to tilt your head forward or downward to look at the screen. Resting   When lying down and resting, avoid positions that are most painful for you.  If you have pain with activities such as sitting, bending, stooping, or squatting (flexion-based activities), lie in a position in which your body does not bend very much. For example, avoid curling up on your side with your arms and knees near your chest (fetal position).  If you have pain with activities such as standing for a long time or reaching with your arms (extension-based activities), lie with your spine in a neutral position and bend your knees slightly. Try the following positions:  Lying on your side with a pillow between your knees.  Lying on your  back with a pillow under your knees. This information is not intended to replace advice given to you by your health care provider. Make sure you discuss any questions you have with your health care provider. Document Released: 09/14/2005 Document Revised: 05/21/2016 Document Reviewed: 06/09/2015 Elsevier Interactive Patient Education  [employer].

## 2018-05-17 NOTE — Assessment & Plan Note (Signed)
Now seeing neurosurgery

## 2018-05-17 NOTE — Assessment & Plan Note (Addendum)
blood glucose control important in reducing the progression of atherosclerotic disease. Also, involved in wound healing.   

## 2018-05-17 NOTE — Assessment & Plan Note (Signed)
The patient has nonspecific left arm symptoms that are not entirely clear in their etiology.  We had a long discussion today regarding the pathophysiology and natural history of atherosclerosis as well as thoracic outlet syndrome.  We discussed the differences between arterial, venous, and neurogenic thoracic outlet symptoms and that there are treatments are vastly different.  I am going to perform a left upper extremity arterial duplex to assess for atherosclerotic disease as well as potential arterial thoracic outlet syndrome although I think that is less likely.  I discussed that neurogenic thoracic outlet syndrome is generally treated with nonsurgical therapies and this is very clearly a possibility as well.  These include physical therapy, weight loss, postural modifications, and referral to a pain clinic.  I will see her back following her noninvasive study to discuss the results and determine further treatment options.

## 2018-05-17 NOTE — Assessment & Plan Note (Signed)
Would increase risk of thoracic outlet as well as joint issues

## 2018-05-23 ENCOUNTER — Other Ambulatory Visit: Payer: Self-pay

## 2018-05-23 ENCOUNTER — Encounter: Payer: Self-pay | Admitting: Emergency Medicine

## 2018-05-23 ENCOUNTER — Emergency Department: Payer: Medicare HMO

## 2018-05-23 ENCOUNTER — Emergency Department
Admission: EM | Admit: 2018-05-23 | Discharge: 2018-05-23 | Disposition: A | Discharge status: Home / Self Care | Payer: Medicare HMO | Attending: Student in an Organized Health Care Education/Training Program | Admitting: Student in an Organized Health Care Education/Training Program

## 2018-05-23 DIAGNOSIS — Z79899 Other long term (current) drug therapy: Secondary | ICD-10-CM | POA: Insufficient documentation

## 2018-05-23 DIAGNOSIS — M7989 Other specified soft tissue disorders: Secondary | ICD-10-CM | POA: Diagnosis not present

## 2018-05-23 DIAGNOSIS — R2242 Localized swelling, mass and lump, left lower limb: Secondary | ICD-10-CM | POA: Insufficient documentation

## 2018-05-23 DIAGNOSIS — R600 Localized edema: Secondary | ICD-10-CM | POA: Diagnosis not present

## 2018-05-23 DIAGNOSIS — M79605 Pain in left leg: Secondary | ICD-10-CM | POA: Diagnosis present

## 2018-05-23 MED ORDER — HYDROCODONE-ACETAMINOPHEN 5-325 MG PO TABS
1.0000 | ORAL_TABLET | Freq: Once | ORAL | Status: AC
  Administered 2018-05-23: 1 via ORAL
  Filled 2018-05-23: qty 1

## 2018-05-23 NOTE — ED Notes (Signed)
Patient transported to Ultrasound 

## 2018-05-23 NOTE — ED Provider Notes (Signed)
[facility] Emergency Department Provider Note    First MD Initiated Contact with Patient 05/23/18 1424     (approximate)  I have reviewed the triage vital signs and the nursing notes.   HISTORY  Chief Complaint Leg Swelling    HPI Kimberly Cowan is a 52 y.o. female with a history of DVT PE no longer on any anticoagulation presents the ER with several days of recurrent left leg pain and swelling to the left calf.  States she had a history of DVT in that leg.  States is worsened with movement.  States is in the muscle of the calf.  Denies any migration.  No fevers.  Has noted some trace swelling to left leg.  Denies any chest pain or shortness of breath.  Previous DVT PE was provoked after laparoscopic surgery.  She completed 6 months of therapy and was cleared.    Past Medical History:  Diagnosis Date  . Anxiety   . Arthritis    lower spine  . Chronic back pain   . Clinical depression 04/04/2012  . Depression   . DVT (deep venous thrombosis) ([place])   . Glaucoma    RIGHT EYE  . Headache(784.0)    migraines  . Ovarian cyst   . Pneumonia    walking pneumonia  . Sleep apnea    no cpcap     last sleep study > 4 yrs   Family History  Problem Relation Age of Onset  . Diabetes Mother   . Hypertension Mother   . Diabetes Sister   . Hypertension Sister   . Hypertension Father   . Breast cancer Neg Hx    Past Surgical History:  Procedure Laterality Date  . ABDOMINAL HYSTERECTOMY  10/2006  . BACK SURGERY  2014   lumbar fusion  . BACK SURGERY  2009   discectomy  . COLONOSCOPY WITH PROPOFOL N/A 04/12/2018   Procedure: COLONOSCOPY WITH PROPOFOL;  Surgeon: Lin Landsman, MD;  Location: [facility] ENDOSCOPY;  Service: Gastroenterology;  Laterality: N/A;  . GASTRIC BYPASS  2005  . KNEE ARTHROSCOPY WITH LATERAL MENISECTOMY Right 05/27/2017   Procedure: KNEE ARTHROSCOPY WITH LATERAL MENISECTOMY;  Surgeon: Hessie Knows, MD;  Location: [facility]  ORS;  Service: Orthopedics;  Laterality: Right;  . KNEE ARTHROSCOPY WITH LATERAL RELEASE Right 05/27/2017   Procedure: KNEE ARTHROSCOPY WITH LATERAL RELEASE;  Surgeon: Hessie Knows, MD;  Location: [facility] ORS;  Service: Orthopedics;  Laterality: Right;  . LAPAROSCOPIC SALPINGO OOPHERECTOMY Bilateral 10/05/2016   Procedure: LAPAROSCOPIC SALPINGO OOPHORECTOMY;  Surgeon: Brayton Mars, MD;  Location: [facility] ORS;  Service: Gynecology;  Laterality: Bilateral;  . REDUCTION MAMMAPLASTY Bilateral    2001  . SPINAL CORD STIMULATOR INSERTION N/A 03/20/2016   Procedure: LUMBAR SPINAL CORD STIMULATOR INSERTION;  Surgeon: Eustace Moore, MD;  Location: [place] NEURO ORS;  Service: Neurosurgery;  Laterality: N/A;  . TONSILLECTOMY     Patient Active Problem List   Diagnosis Date Noted  . Arm pain, left 05/17/2018  . Encounter for screening colonoscopy   . Mild peripheral edema 11/15/2017  . Perforated ear drum, left 06/25/2017  . History of pulmonary embolism 06/10/2017  . Environmental and seasonal allergies 04/07/2017  . Major depressive disorder with single episode, in partial remission ([place]) 04/07/2017  . Tinea versicolor 02/03/2017  . Vasomotor symptoms due to menopause 11/11/2016  . Morbid obesity ([place]) 09/10/2016  . B12 nutritional deficiency 08/05/2016  . Obstructive sleep apnea of adult 03/04/2016  . Bilateral lumbar radiculopathy  08/14/2015  . Status post gastric bypass for obesity 07/04/2015  . Prediabetes 07/01/2015  . Headache, migraine 04/04/2012      Prior to Admission medications   Medication Sig Start Date End Date Taking? Authorizing Provider  BD INTEGRA SYRINGE 25G X 1" 3 ML MISC USE AS DIRECTED 11/07/17   Glean Hess, MD  Cetirizine HCl 10 MG CAPS Take 1 capsule (10 mg total) by mouth daily. Patient taking differently: Take 10 mg by mouth at bedtime.  04/07/17   Glean Hess, MD  cyclobenzaprine (FLEXERIL) 10 MG tablet Take 1 tablet by mouth 3 (three) times daily. 12/28/17    [provider]  ferrous sulfate 325 (65 FE) MG EC tablet Take 325 mg by mouth 3 (three) times daily with meals.    [provider]  fluticasone Asencion Islam) 50 MCG/ACT nasal spray USE 2 SPRAYS IN [facility] NOSTRIL DAILY 09/29/17   Glean Hess, MD  gabapentin (NEURONTIN) 300 MG capsule Take 600 mg by mouth at bedtime.  01/01/16   [provider]  ketoconazole (NIZORAL) 2 % shampoo Apply 1 application topically daily as needed (for skin fungus on neck/back). 11/15/17   Glean Hess, MD  latanoprost (XALATAN) 0.005 % ophthalmic solution Place 1 drop into both eyes at bedtime.     [provider]  naproxen (NAPROSYN) 500 MG tablet Take 1 tablet (500 mg total) by mouth 2 (two) times daily with a meal. 05/04/18   Carrie Mew, MD  oxyCODONE-acetaminophen (PERCOCET/ROXICET) 5-325 MG tablet Take 1 tablet by mouth daily. 12/15/17   [provider]  predniSONE (DELTASONE) 10 MG tablet Take 6 on day 1, 5 on day 2, 4 on day 3, 3 on day 4, 2 on day 5 and 1 on day 1 then stop. Patient not taking: Reported on 05/17/2018 04/19/18   Glean Hess, MD  rizatriptan (MAXALT-MLT) 10 MG disintegrating tablet DISSOLVE ONE TABLET BY MOUTH AS NEEDED FOR MIGRAINE. MAY REPEAT IN 2 HOURS IF NEEDED 07/02/17   Glean Hess, MD  triamterene-hydrochlorothiazide (DXIPJAS-50) 37.5-25 MG tablet TAKE 1 TABLET BY MOUTH EVERY DAY AS NEEDED 10/29/17   Glean Hess, MD    Allergies Morphine and related; Penicillins; Latex; and Baclofen    Social History Social History   Tobacco Use  . Smoking status: Never Smoker  . Smokeless tobacco: Never Used  . Tobacco comment: smoking cessation materials not required  Substance Use Topics  . Alcohol use: No    Alcohol/week: 0.0 standard drinks  . Drug use: No    Review of Systems Patient denies headaches, rhinorrhea, blurry vision, numbness, shortness of breath, chest pain, edema, cough, abdominal pain, nausea, vomiting,  diarrhea, dysuria, fevers, rashes or hallucinations unless otherwise stated above in HPI. ____________________________________________   PHYSICAL EXAM:  VITAL SIGNS: Vitals:   05/23/18 1346  BP: (!) 139/97  Pulse: 82  Resp: 18  Temp: 98.5 F (36.9 C)  SpO2: 98%    Constitutional: Alert and oriented.  Eyes: Conjunctivae are normal.  Head: Atraumatic. Nose: No congestion/rhinnorhea. Mouth/Throat: Mucous membranes are moist.   Neck: No stridor. Painless ROM.  Cardiovascular: Normal rate, regular rhythm. Grossly normal heart sounds.  Good peripheral circulation. Respiratory: Normal respiratory effort.  No retractions. Lungs CTAB. Gastrointestinal: Soft and nontender. No distention. No abdominal bruits. No CVA tenderness. Genitourinary:  Musculoskeletal: No lower extremity tenderness to palpation, trace edema. 2+ DP and PT pulses BLE.  No joint effusions. Neurologic:  Normal speech and language. No gross focal  neurologic deficits are appreciated. No facial droop Skin:  Skin is warm, dry and intact. No rash noted. Psychiatric: Mood and affect are normal. Speech and behavior are normal.  ____________________________________________   LABS (all labs ordered are listed, but only abnormal results are displayed)  No results found for this or any previous visit (from the past 24 hour(s)). ____________________________________________ ___________________________  [id number]  I personally reviewed all radiographic images ordered to evaluate for the above acute complaints and reviewed radiology reports and findings.  These findings were personally discussed with the patient.  Please see medical record for radiology report.  ____________________________________________   PROCEDURES  Procedure(s) performed:  Procedures    Critical Care performed: no ____________________________________________   INITIAL IMPRESSION / ASSESSMENT AND PLAN / ED COURSE  Pertinent labs & imaging  results that were available during my care of the patient were reviewed by me and considered in my medical decision making (see chart for details).   DDX: DVT, posttraumatic syndrome, cellulitis, neuropathy  Kimberly Cowan is a 52 y.o. who presents to the ED with symptoms as described above.  Patient afebrile.  Exam is reassuring.  Well-perfused lower extremity.  Trace edema.  Ultrasound shows no evidence of DVT.  Pain controlled.  Patient stable and appropriate for outpatient follow-up.      As part of my medical decision making, I reviewed the following data within the [place] notes reviewed and incorporated, Labs reviewed, notes from prior ED visits.  ____________________________________________   FINAL CLINICAL IMPRESSION(S) / ED DIAGNOSES  Final diagnoses:  Left leg swelling      NEW MEDICATIONS STARTED DURING THIS VISIT:  Discharge Medication List as of 05/23/2018  3:58 PM       Note:  This document was prepared using Dragon voice recognition software and may include unintentional dictation errors.    Merlyn Lot, MD 05/23/18 [phone]

## 2018-05-23 NOTE — ED Triage Notes (Signed)
Noted L leg edema this am. Denies fall or injury.

## 2018-05-23 NOTE — ED Notes (Signed)
PT verbalizes d/c understanding and follow up, pt in NAD, pt ambulatory. PT waiting on ride in lobby. Pt unable to sign due to signature pad malfnx

## 2018-05-25 ENCOUNTER — Other Ambulatory Visit: Payer: Self-pay | Admitting: Internal Medicine

## 2018-05-25 DIAGNOSIS — J0100 Acute maxillary sinusitis, unspecified: Secondary | ICD-10-CM

## 2018-06-02 DIAGNOSIS — Z6841 Body Mass Index (BMI) 40.0 and over, adult: Secondary | ICD-10-CM | POA: Diagnosis not present

## 2018-06-02 DIAGNOSIS — M961 Postlaminectomy syndrome, not elsewhere classified: Secondary | ICD-10-CM | POA: Diagnosis not present

## 2018-06-02 DIAGNOSIS — I10 Essential (primary) hypertension: Secondary | ICD-10-CM | POA: Diagnosis not present

## 2018-06-03 ENCOUNTER — Other Ambulatory Visit: Payer: Self-pay | Admitting: Internal Medicine

## 2018-06-08 ENCOUNTER — Ambulatory Visit: Payer: Medicare HMO | Admitting: Internal Medicine

## 2018-06-08 ENCOUNTER — Other Ambulatory Visit: Payer: Self-pay | Admitting: Internal Medicine

## 2018-06-08 ENCOUNTER — Telehealth: Payer: Self-pay

## 2018-06-08 NOTE — Telephone Encounter (Signed)
Patient made my chart appt to discuss weight loss medications. Called patient per Dr Army Melia and informed pt we do not prescribe those medications here so she would need to discuss this with her bariatric surgeon.  She stated her bariatric surgeon is no longer in practice so could we sent her to a dietitian. Spoke with Dr Army Melia and referral was placed and patient appt was canceled. Pt aware and verbalized understanding.

## 2018-06-13 DIAGNOSIS — M48061 Spinal stenosis, lumbar region without neurogenic claudication: Secondary | ICD-10-CM | POA: Diagnosis not present

## 2018-06-13 DIAGNOSIS — G4733 Obstructive sleep apnea (adult) (pediatric): Secondary | ICD-10-CM | POA: Diagnosis not present

## 2018-06-13 DIAGNOSIS — M5416 Radiculopathy, lumbar region: Secondary | ICD-10-CM | POA: Diagnosis not present

## 2018-06-13 DIAGNOSIS — M961 Postlaminectomy syndrome, not elsewhere classified: Secondary | ICD-10-CM | POA: Diagnosis not present

## 2018-06-22 ENCOUNTER — Encounter (INDEPENDENT_AMBULATORY_CARE_PROVIDER_SITE_OTHER): Payer: Self-pay | Admitting: Nurse Practitioner

## 2018-06-22 ENCOUNTER — Ambulatory Visit (INDEPENDENT_AMBULATORY_CARE_PROVIDER_SITE_OTHER): Payer: Medicare HMO | Admitting: Nurse Practitioner

## 2018-06-22 ENCOUNTER — Ambulatory Visit (INDEPENDENT_AMBULATORY_CARE_PROVIDER_SITE_OTHER): Payer: Medicare HMO

## 2018-06-22 VITALS — BP 143/90 | HR 90 | Resp 16 | Ht 67.0 in | Wt 345.0 lb

## 2018-06-22 DIAGNOSIS — R7303 Prediabetes: Secondary | ICD-10-CM | POA: Diagnosis not present

## 2018-06-22 DIAGNOSIS — M79602 Pain in left arm: Secondary | ICD-10-CM

## 2018-06-22 NOTE — Progress Notes (Signed)
Subjective:    Patient ID: Kimberly Cowan, female    DOB: 27-Apr-1966, 52 y.o.   MRN: [id number] Chief Complaint  Patient presents with  . Follow-up    pt conv left art duplex    HPI  Kimberly Cowan is a 52 y.o. female that presents today following an evaluation referral from Dr. Peggye Ley for evaluation of left arm thoracic outlet syndrome.  The patient reports years of numbness, pain, and tingling.  The patient states that her arm will go cold at times.  Patient states that her lately her range of motion has been greatly decreased as well as increasing pain of her arm as well.  There is no new causative factor.  Nothing seems to make the pain better.  The patient has an extensive history of spinal surgeries, as well as a spinal cord stimulator for chronic back pain.  Today the patient presents with decreased range of motion, she is unable to raise her arm even to shoulder level.  She also endorses pain in her elbow.  She also has pain on palpation at the scalene muscles.  The patient denies any fever, chills, nausea, vomiting.  She denies any chest pain or shortness of breath.  She denies any TIA-like symptoms or sudden intense pain in her hand or arm.  She denies any gross swelling.  She denies any cyanosis of her hand or fingers.  The patient underwent an upper extremity duplex study today for thoracic outlet syndrome.  She had triphasic waveforms from her subclavian artery through to her radial and ulnar arteries.  The test was somewhat limited due to the patient's decreased range of motion as she was unable to raise her arm up above 60 degrees.  Based on these limitations no obstruction was visualized in the left upper extremity.  There is no evidence of any positional decrease in perfusion to the bilateral upper extremities based on this evaluation.  Review of Systems   Review of Systems: Negative Unless Checked Constitutional: [] Weight loss  [] Fever   [] Chills Cardiac: [] Chest pain   ? Atrial Fibrillation  [] Palpitations   [] Shortness of breath when laying flat   [] Shortness of breath with exertion. Vascular:  [] Pain in legs with walking   [] Pain in legs with standing  [x] History of DVT   [] Phlebitis   [] Swelling in legs   [] Varicose veins   [] Non-healing ulcers Pulmonary:   [] Uses home oxygen   [] Productive cough   [] Hemoptysis   [] Wheeze  [] COPD   [] Asthma Neurologic:  [] Dizziness   [] Seizures   [] History of stroke   [] History of TIA  [] Aphasia   [] Vissual changes   [x] Weakness or numbness in arm   [] Weakness or numbness in leg Musculoskeletal:   [] Joint swelling   [x] Joint pain   [] Low back pain  ? History of Knee Replacement Hematologic:  [] Easy bruising  [] Easy bleeding   [] Hypercoagulable state   [] Anemic Gastrointestinal:  [] Diarrhea   [] Vomiting  [] Gastroesophageal reflux/heartburn   [] Difficulty swallowing. Genitourinary:  [] Chronic kidney disease   [] Difficult urination  [] Anuric   [] Blood in urine Skin:  [] Rashes   [] Ulcers  Psychological:  [] History of anxiety   []  History of major depression  ? Memory Difficulties     Objective:   Physical Exam  BP (!) 143/90 (BP Location: Right Arm)   Pulse 90   Resp 16   Ht 5\' 7"  (1.702 m)   Wt (!) 345 lb (156.5 kg)   BMI 54.03 kg/m  Past Medical History:  Diagnosis Date  . Anxiety   . Arthritis    lower spine  . Chronic back pain   . Clinical depression 04/04/2012  . Depression   . DVT (deep venous thrombosis) ([place])   . Glaucoma    RIGHT EYE  . Headache(784.0)    migraines  . Ovarian cyst   . Pneumonia    walking pneumonia  . Sleep apnea    no cpcap     last sleep study > 4 yrs     Gen: WD/WN, NAD, morbidly obese Head: [place]/AT, No temporalis wasting.  Ear/Nose/Throat: Hearing grossly intact, nares w/o erythema or drainage Eyes: PER, EOMI, sclera nonicteric.  Neck: Supple, no masses.  No JVD.  Pulmonary:  Good air movement, no use of accessory muscles.  Cardiac:  RRR Vascular:  Extremely tender scalene muscle on light palpation.  Patient unable to lift her arm to the level of the shoulder.  Light palpable radial pulse found on the left upper extremity.  No edema present on left upper extremity Vessel Right Left  Radial Palpable Palpable  ulner Palpable Palpable   Gastrointestinal: soft, non-distended. No guarding/no peritoneal signs.  Musculoskeletal: M/S 5/5 throughout.  No deformity or atrophy.  Neurologic: Pain and light touch intact in extremities.  Symmetrical.  Speech is fluent. Motor exam as listed above. Psychiatric: Judgment intact, Mood & affect appropriate for pt's clinical situation. Dermatologic: No Venous rashes. No Ulcers Noted.  No changes consistent with cellulitis. Lymph : No Cervical lymphadenopathy, no lichenification or skin changes of chronic lymphedema.   Social History   Socioeconomic History  . Marital status: Widowed    Spouse name: Not on file  . Number of children: 2  . Years of education: some college  . Highest education level: 12th grade  Occupational History  . Occupation: disabled  Social Needs  . Financial resource strain: Somewhat hard  . Food insecurity:    Worry: Often true    Inability: Often true  . Transportation needs:    Medical: No    Non-medical: No  Tobacco Use  . Smoking status: Never Smoker  . Smokeless tobacco: Never Used  . Tobacco comment: smoking cessation materials not required  Substance and Sexual Activity  . Alcohol use: No    Alcohol/week: 0.0 standard drinks  . Drug use: No  . Sexual activity: Not Currently    Birth control/protection: Surgical  Lifestyle  . Physical activity:    Days per week: 0 days    Minutes per session: 0 min  . Stress: Not at all  Relationships  . Social connections:    Talks on phone: Patient refused    Gets together: Patient refused    Attends religious service: Patient refused    Active member of club or organization: Patient refused     Attends meetings of clubs or organizations: Patient refused    Relationship status: Widowed  . Intimate partner violence:    Fear of current or ex partner: No    Emotionally abused: No    Physically abused: No    Forced sexual activity: No  Other Topics Concern  . Not on file  Social History Narrative  . Not on file    Past Surgical History:  Procedure Laterality Date  . ABDOMINAL HYSTERECTOMY  10/2006  . BACK SURGERY  2014   lumbar fusion  . BACK SURGERY  2009   discectomy  . COLONOSCOPY WITH PROPOFOL N/A 04/12/2018   Procedure: COLONOSCOPY WITH PROPOFOL;  Surgeon: Lin Landsman, MD;  Location: [facility] ENDOSCOPY;  Service: Gastroenterology;  Laterality: N/A;  . GASTRIC BYPASS  2005  . KNEE ARTHROSCOPY WITH LATERAL MENISECTOMY Right 05/27/2017   Procedure: KNEE ARTHROSCOPY WITH LATERAL MENISECTOMY;  Surgeon: Hessie Knows, MD;  Location: [facility] ORS;  Service: Orthopedics;  Laterality: Right;  . KNEE ARTHROSCOPY WITH LATERAL RELEASE Right 05/27/2017   Procedure: KNEE ARTHROSCOPY WITH LATERAL RELEASE;  Surgeon: Hessie Knows, MD;  Location: [facility] ORS;  Service: Orthopedics;  Laterality: Right;  . LAPAROSCOPIC SALPINGO OOPHERECTOMY Bilateral 10/05/2016   Procedure: LAPAROSCOPIC SALPINGO OOPHORECTOMY;  Surgeon: Brayton Mars, MD;  Location: [facility] ORS;  Service: Gynecology;  Laterality: Bilateral;  . REDUCTION MAMMAPLASTY Bilateral    2001  . SPINAL CORD STIMULATOR INSERTION N/A 03/20/2016   Procedure: LUMBAR SPINAL CORD STIMULATOR INSERTION;  Surgeon: Eustace Moore, MD;  Location: [facility] OR[facility];  Service: Neurosurgery;  Laterality: N/A;  . TONSILLECTOMY      Family History  Problem Relation Age of Onset  . Diabetes Mother   . Hypertension Mother   . Diabetes Sister   . Hypertension Sister   . Hypertension Father   . Breast cancer Neg Hx     Allergies  Allergen Reactions  . Morphine And Related Hives, Shortness Of Breath, Nausea And Vomiting and Other (See Comments)     "can't breathe"  . Penicillins Anaphylaxis, Hives, Shortness Of Breath and Nausea And Vomiting    "can't breathe", Has patient had a PCN reaction causing immediate rash, facial/tongue/throat swelling, SOB or lightheadedness with hypotension: Yes Has patient had a PCN reaction causing severe rash involving mucus membranes or skin necrosis: No Has patient had a PCN reaction that required hospitalization No Has patient had a PCN reaction occurring within the last 10 years: No If all of the above answers are "NO", then may proceed with Cephalosporin use.   . Latex Hives  . Baclofen Nausea Only    Jittery, anxious       Assessment & Plan:   1. Arm pain, left Upon reviewing the patient's test results as well as listening to the patient's description of her pain and symptoms, it is likely this is neurogenic in cause.  We had a long discussion as to the differences between arterial, venous, versus neurogenic thoracic outlet syndrome.  We discussed treatment options for each as well as symptoms.  We discussed that in the absence of any arterial or venous causes, the treatment for neurogenic thoracic outlet syndrome is largely nonsurgical in the form of behavioral modification with things such as weight loss, physical therapy, and postural changes.  The patient has been followed by Dr. Peggye Ley with [facility] due to long history of multiple back surgeries.  I will defer back to Dr. Peggye Ley for continued management and treatment options of possible neurogenic thoracic outlet syndrome.  2. Morbid obesity ([place]) Patient currently working with primary care provider for options for weight loss.  Obesity can increase symptoms of neurogenic thoracic outlet syndrome.  3. Prediabetes Continue diet modification  as already ordered, these medications have been reviewed and there are no changes at this time.  Hgb A1C to be monitored as already arranged by primary service    Current Outpatient Medications on  File Prior to Visit  Medication Sig Dispense Refill  . BD INTEGRA SYRINGE 25G X 1" 3 ML MISC USE AS DIRECTED 50 each 12  . Cetirizine HCl 10 MG CAPS Take 1 capsule (10 mg total) by mouth daily. (Patient taking  differently: Take 10 mg by mouth at bedtime. ) 30 capsule 12  . cyclobenzaprine (FLEXERIL) 10 MG tablet Take 1 tablet by mouth 3 (three) times daily.    . ferrous sulfate 325 (65 FE) MG EC tablet Take 325 mg by mouth 3 (three) times daily with meals.    . fluticasone (FLONASE) 50 MCG/ACT nasal spray SHAKE LIQUID AND USE 2 SPRAYS IN EACH NOSTRIL DAILY 16 g 5  . gabapentin (NEURONTIN) 300 MG capsule Take 600 mg by mouth at bedtime.   0  . ketoconazole (NIZORAL) 2 % shampoo Apply 1 application topically daily as needed (for skin fungus on neck/back). 120 mL 1  . latanoprost (XALATAN) 0.005 % ophthalmic solution Place 1 drop into both eyes at bedtime.     . naproxen (NAPROSYN) 500 MG tablet Take 1 tablet (500 mg total) by mouth 2 (two) times daily with a meal. 20 tablet 0  . oxyCODONE-acetaminophen (PERCOCET/ROXICET) 5-325 MG tablet Take 1 tablet by mouth daily.  0  . rizatriptan (MAXALT-MLT) 10 MG disintegrating tablet DISSOLVE ONE TABLET BY MOUTH AS NEEDED FOR MIGRAINE. MAY REPEAT IN 2 HOURS IF NEEDED 10 tablet 5  . triamterene-hydrochlorothiazide (MAXZIDE-25) 37.5-25 MG tablet TAKE 1 TABLET BY MOUTH EVERY DAY AS NEEDED 30 tablet 5   No current facility-administered medications on file prior to visit.     There are no Patient Instructions on file for this visit. No follow-ups on file.   Kris Hartmann, NP

## 2018-06-23 ENCOUNTER — Encounter: Payer: Medicare HMO | Attending: Internal Medicine | Admitting: Dietician

## 2018-06-23 ENCOUNTER — Encounter: Payer: Self-pay | Admitting: Dietician

## 2018-06-23 VITALS — Ht 67.0 in | Wt 345.0 lb

## 2018-06-23 DIAGNOSIS — Z713 Dietary counseling and surveillance: Secondary | ICD-10-CM | POA: Insufficient documentation

## 2018-06-23 DIAGNOSIS — G5602 Carpal tunnel syndrome, left upper limb: Secondary | ICD-10-CM | POA: Diagnosis not present

## 2018-06-23 DIAGNOSIS — Z6841 Body Mass Index (BMI) 40.0 and over, adult: Secondary | ICD-10-CM

## 2018-06-23 NOTE — Progress Notes (Signed)
Medical Nutrition Therapy: Visit start time: 1100  end time: 1200  Assessment:  Diagnosis: obesity Past medical history: gastric bypass surgery 2005; sleep apnea; spinal stenosis Psychosocial issues/ stress concerns: depression, taking antidepressant medication  Preferred learning method:  . Auditory . Visual . Hands-on   Current weight: 345lbs Height: 5'7" Medications, supplements: reconciled list in medical record  Progress and evaluation: Patient reports gradual weight gain over time; was last at goal of under 200lbs during high school; has tried weight loss diets in the past with only limited success. She had gastric bypass surgery in 2005 and lost from almost 500lbs, down to about 270lbs. Patient voices frustration with inability to lose weight and not knowing how to resume weight loss with limited food budget and limited ability to exercise.    Physical activity: none due to back pain which worsens after exercise.   Dietary Intake:  Usual eating pattern includes 1-2 meals and 2-3 snacks per day. Dining out frequency: 2 meals per week.  Breakfast: 10-11am bacon and toast; sometimes skips Snack: fruit cup about 3 per day, or banana Lunch: fruit cup Snack: fruit Supper: time varies -- grilled cheese sandwich; grilled chicken or ground beef (rarely eats meat due to cost) Snack: pkg Nekot cookies Beverages: water. Coffee maybe once a week, occasional ginger ale.   Nutrition Care Education: Topics covered: weight control Basic nutrition: importance of adequate nutrition and avoidance of vitamin and mineral deficiencies Weight control: factors affecting weight and metabolism after bariatric surgery, including physical activity, sleep, stress, skipped meals; benefits of eating at regular intervals; plate method for basic meal planning; beneifts of tracking food intake; appropriate food portions and low-cost meal and snack options. Encouraged light exercise as tolerated.  Bariatric  diet: importance of adequate protein and low-cost protein foods; need for lifelong multivitamin supplementation; gradual increase in energy intake to help increase metabolism.   Nutritional Diagnosis:  [place]-3.3 Overweight/obesity As related to inactivity, erratic eating pattern, weight regain after bariatric surgery.  As evidenced by patient with BMI of 54 and patient report of weight and diet history.  Intervention: Instruction as noted above.   Set goals with input from patient.    Patient is willing to try diet changes, but is unsure of achieving weight loss success.    Patient would like to try weight loss medication.      Education Materials given:  . Plate Planner with food lists . Sample menus . Build a [employer] . Exercise booklet (ADA) . Goals/ instructions   Learner/ who was taught:  . Patient    Level of understanding: Marland Kitchen Verbalizes/ demonstrates competency   Demonstrated degree of understanding via:   Teach back Learning barriers: . None  Willingness to learn/ readiness for change: . Acceptance, ready for change   Monitoring and Evaluation:  Dietary intake, exercise, and body weight      follow up: 07/22/18

## 2018-06-23 NOTE — Patient Instructions (Signed)
   Plan to eat a small meal or snack every 4-5 hours during the day. Try to include a protein, a vegetable or fruit, and a small portion of starch with each meal.   Include a few minutes of some light exercise as often as possible to boost metabolism, such as chair exercises.   Consider keeping a food diary to monitor what, how much, and how often you eat.

## 2018-06-24 DIAGNOSIS — M5412 Radiculopathy, cervical region: Secondary | ICD-10-CM | POA: Diagnosis not present

## 2018-07-04 ENCOUNTER — Other Ambulatory Visit: Payer: Self-pay

## 2018-07-04 ENCOUNTER — Telehealth: Payer: Self-pay

## 2018-07-04 ENCOUNTER — Other Ambulatory Visit: Payer: Self-pay | Admitting: Physical Medicine and Rehabilitation

## 2018-07-04 DIAGNOSIS — M5412 Radiculopathy, cervical region: Secondary | ICD-10-CM

## 2018-07-04 DIAGNOSIS — R635 Abnormal weight gain: Secondary | ICD-10-CM

## 2018-07-04 NOTE — Telephone Encounter (Signed)
Patient informed. Placed referral for her to go back to Dr Cammie Sickle at [facility] in [facility]

## 2018-07-04 NOTE — Telephone Encounter (Signed)
She should be able to return to the same bariatric clinic even if the MD has left the practice.  I do not prescribe weight loss medications.

## 2018-07-04 NOTE — Telephone Encounter (Signed)
Patient called stating she seen the nutritionist and they do not prescribe weight loss medication. Was told to call PCP and get medication from here.  Please Advise. Patient called about this month ago and we sent to dietitian because her bariatric doctor is no longer in practice.

## 2018-07-13 DIAGNOSIS — H40153 Residual stage of open-angle glaucoma, bilateral: Secondary | ICD-10-CM | POA: Diagnosis not present

## 2018-07-15 DIAGNOSIS — M961 Postlaminectomy syndrome, not elsewhere classified: Secondary | ICD-10-CM | POA: Diagnosis not present

## 2018-07-15 DIAGNOSIS — I10 Essential (primary) hypertension: Secondary | ICD-10-CM | POA: Diagnosis not present

## 2018-07-15 DIAGNOSIS — G5602 Carpal tunnel syndrome, left upper limb: Secondary | ICD-10-CM | POA: Diagnosis not present

## 2018-07-15 DIAGNOSIS — G894 Chronic pain syndrome: Secondary | ICD-10-CM | POA: Diagnosis not present

## 2018-07-15 DIAGNOSIS — Z91040 Latex allergy status: Secondary | ICD-10-CM | POA: Diagnosis not present

## 2018-07-15 DIAGNOSIS — E669 Obesity, unspecified: Secondary | ICD-10-CM | POA: Diagnosis not present

## 2018-07-15 DIAGNOSIS — Z9889 Other specified postprocedural states: Secondary | ICD-10-CM | POA: Diagnosis not present

## 2018-07-15 DIAGNOSIS — Z9689 Presence of other specified functional implants: Secondary | ICD-10-CM | POA: Diagnosis not present

## 2018-07-15 DIAGNOSIS — G4733 Obstructive sleep apnea (adult) (pediatric): Secondary | ICD-10-CM | POA: Diagnosis not present

## 2018-07-20 ENCOUNTER — Ambulatory Visit
Admission: RE | Admit: 2018-07-20 | Discharge: 2018-07-20 | Disposition: A | Discharge status: Home / Self Care | Payer: Medicare HMO | Source: Ambulatory Visit | Attending: Physical Medicine and Rehabilitation | Admitting: Physical Medicine and Rehabilitation

## 2018-07-20 DIAGNOSIS — M5412 Radiculopathy, cervical region: Secondary | ICD-10-CM | POA: Insufficient documentation

## 2018-07-22 ENCOUNTER — Ambulatory Visit: Payer: Medicare HMO | Admitting: Dietician

## 2018-07-25 DIAGNOSIS — G4733 Obstructive sleep apnea (adult) (pediatric): Secondary | ICD-10-CM | POA: Diagnosis not present

## 2018-07-25 DIAGNOSIS — M4800 Spinal stenosis, site unspecified: Secondary | ICD-10-CM | POA: Diagnosis not present

## 2018-08-15 ENCOUNTER — Telehealth: Payer: Self-pay

## 2018-08-15 ENCOUNTER — Ambulatory Visit (INDEPENDENT_AMBULATORY_CARE_PROVIDER_SITE_OTHER): Payer: Medicare HMO | Admitting: Internal Medicine

## 2018-08-15 ENCOUNTER — Encounter: Payer: Self-pay | Admitting: Internal Medicine

## 2018-08-15 VITALS — BP 132/70 | HR 84 | Ht 67.0 in | Wt 360.0 lb

## 2018-08-15 DIAGNOSIS — F325 Major depressive disorder, single episode, in full remission: Secondary | ICD-10-CM

## 2018-08-15 DIAGNOSIS — E538 Deficiency of other specified B group vitamins: Secondary | ICD-10-CM

## 2018-08-15 DIAGNOSIS — Z23 Encounter for immunization: Secondary | ICD-10-CM

## 2018-08-15 DIAGNOSIS — J3089 Other allergic rhinitis: Secondary | ICD-10-CM | POA: Diagnosis not present

## 2018-08-15 DIAGNOSIS — R609 Edema, unspecified: Secondary | ICD-10-CM | POA: Diagnosis not present

## 2018-08-15 DIAGNOSIS — R7303 Prediabetes: Secondary | ICD-10-CM

## 2018-08-15 DIAGNOSIS — R600 Localized edema: Secondary | ICD-10-CM

## 2018-08-15 HISTORY — DX: Major depressive disorder, single episode, in full remission: F32.5

## 2018-08-15 MED ORDER — CYANOCOBALAMIN 1000 MCG/ML IJ SOLN
1000.0000 ug | Freq: Once | INTRAMUSCULAR | Status: AC
  Administered 2018-08-15: 1000 ug via INTRAMUSCULAR

## 2018-08-15 MED ORDER — TRIAMTERENE-HCTZ 75-50 MG PO TABS: 1.0000 | ORAL_TABLET | Freq: Every day | ORAL | 5 refills | Status: DC

## 2018-08-15 MED ORDER — CETIRIZINE HCL 10 MG PO CAPS: 10.0000 mg | ORAL_CAPSULE | Freq: Every day | ORAL | 5 refills | Status: DC

## 2018-08-15 NOTE — Progress Notes (Signed)
Date:  08/15/2018   Name:  Kimberly Cowan   DOB:  05-May-1966   MRN:  [id number]   Chief Complaint: Edema (Started in August. Still present. Swelling has gotten no better and is painful. ); b12 deficiency (B12 injection needed while here. ); and Immunizations (FLu shot. )  Diabetes  She presents for her follow-up diabetic visit. Diabetes type: prediabetes. Her disease course has been stable. Pertinent negatives for hypoglycemia include no dizziness or headaches. Pertinent negatives for diabetes include no chest pain and no fatigue. Symptoms are stable. Current diabetic treatment includes diet. An ACE inhibitor/angiotensin II receptor blocker is not being taken.   Edema - worsening edema in legs, now more painful but not open or weeping.  She has gained 15 lbs since last visit. She had to move out from her dad's house and is sleeping in a recliner at here daughter's.  She has been unable to get her legs really elevated.  Allergies - taking zyrtec daily, out of refills from the pharmacy.  Depression -  Not taking any regular medication.  In general feeling well but has some recent stress having to move and financial issues.  Not tearful or feeling hopeless.   Gastric bypass - due for B12 injection.  Still waiting for [facility] to review her records and schedule an appointment.  She has gained back much of the weight she initially lost and needs structured program, medications etc for success.  Wt Readings from Last 3 Encounters:  08/15/18 (!) 360 lb (163.3 kg)  06/23/18 (!) 345 lb (156.5 kg)  06/22/18 (!) 345 lb (156.5 kg)    Review of Systems  Constitutional: Positive for unexpected weight change. Negative for chills, fatigue and fever.  Respiratory: Negative for chest tightness and shortness of breath.   Cardiovascular: Positive for leg swelling. Negative for chest pain and palpitations.  Gastrointestinal: Negative for abdominal pain.  Musculoskeletal: Positive for arthralgias  and back pain.  Skin: Negative for rash.  Neurological: Negative for dizziness and headaches.  Psychiatric/Behavioral: Negative for dysphoric mood and sleep disturbance.    Patient Active Problem List   Diagnosis Date Noted  . Arm pain, left 05/17/2018  . Encounter for screening colonoscopy   . Mild peripheral edema 11/15/2017  . Perforated ear drum, left 06/25/2017  . History of pulmonary embolism 06/10/2017  . Environmental and seasonal allergies 04/07/2017  . Major depressive disorder with single episode, in partial remission ([place]) 04/07/2017  . Tinea versicolor 02/03/2017  . Vasomotor symptoms due to menopause 11/11/2016  . Morbid obesity ([place]) 09/10/2016  . B12 nutritional deficiency 08/05/2016  . Obstructive sleep apnea of adult 03/04/2016  . Bilateral lumbar radiculopathy 08/14/2015  . Status post gastric bypass for obesity 07/04/2015  . Prediabetes 07/01/2015  . Headache, migraine 04/04/2012    Allergies  Allergen Reactions  . Morphine And Related Hives, Shortness Of Breath, Nausea And Vomiting and Other (See Comments)    "can't breathe"  . Penicillins Anaphylaxis, Hives, Shortness Of Breath and Nausea And Vomiting    "can't breathe", Has patient had a PCN reaction causing immediate rash, facial/tongue/throat swelling, SOB or lightheadedness with hypotension: Yes Has patient had a PCN reaction causing severe rash involving mucus membranes or skin necrosis: No Has patient had a PCN reaction that required hospitalization No Has patient had a PCN reaction occurring within the last 10 years: No If all of the above answers are "NO", then may proceed with Cephalosporin use.   . Latex Hives  .  Baclofen Nausea Only    Jittery, anxious    Past Surgical History:  Procedure Laterality Date  . ABDOMINAL HYSTERECTOMY  10/2006  . BACK SURGERY  2014   lumbar fusion  . BACK SURGERY  2009   discectomy  . COLONOSCOPY WITH PROPOFOL N/A 04/12/2018   Procedure: COLONOSCOPY WITH  PROPOFOL;  Surgeon: Lin Landsman, MD;  Location: [facility] ENDOSCOPY;  Service: Gastroenterology;  Laterality: N/A;  . GASTRIC BYPASS  2005  . KNEE ARTHROSCOPY WITH LATERAL MENISECTOMY Right 05/27/2017   Procedure: KNEE ARTHROSCOPY WITH LATERAL MENISECTOMY;  Surgeon: Hessie Knows, MD;  Location: [facility] ORS;  Service: Orthopedics;  Laterality: Right;  . KNEE ARTHROSCOPY WITH LATERAL RELEASE Right 05/27/2017   Procedure: KNEE ARTHROSCOPY WITH LATERAL RELEASE;  Surgeon: Hessie Knows, MD;  Location: [facility] ORS;  Service: Orthopedics;  Laterality: Right;  . LAPAROSCOPIC SALPINGO OOPHERECTOMY Bilateral 10/05/2016   Procedure: LAPAROSCOPIC SALPINGO OOPHORECTOMY;  Surgeon: Brayton Mars, MD;  Location: [facility] ORS;  Service: Gynecology;  Laterality: Bilateral;  . REDUCTION MAMMAPLASTY Bilateral    2001  . SPINAL CORD STIMULATOR INSERTION N/A 03/20/2016   Procedure: LUMBAR SPINAL CORD STIMULATOR INSERTION;  Surgeon: Eustace Moore, MD;  Location: [facility]UR[facility] ORS;  Service: Neurosurgery;  Laterality: N/A;  . TONSILLECTOMY      Social History   Tobacco Use  . Smoking status: Never Smoker  . Smokeless tobacco: Never Used  . Tobacco comment: smoking cessation materials not required  Substance Use Topics  . Alcohol use: No    Alcohol/week: 0.0 standard drinks  . Drug use: No     Medication list has been reviewed and updated.  Current Meds  Medication Sig  . BD INTEGRA SYRINGE 25G X 1" 3 ML MISC USE AS DIRECTED  . Cetirizine HCl 10 MG CAPS Take 1 capsule (10 mg total) by mouth daily. (Patient taking differently: Take 10 mg by mouth at bedtime. )  . cyanocobalamin (,VITAMIN B-12,) 1000 MCG/ML injection cyanocobalamin (vit B-12) 1,000 mcg/mL injection solution  INJECT 1 ML INTO THE MUSCLE Q 30 DAYS  . cyclobenzaprine (FLEXERIL) 10 MG tablet Take 1 tablet by mouth 3 (three) times daily.  . ferrous sulfate 325 (65 FE) MG EC tablet Take 325 mg by mouth 3 (three) times daily with meals.  . fluticasone  (FLONASE) 50 MCG/ACT nasal spray SHAKE LIQUID AND USE 2 SPRAYS IN EACH NOSTRIL DAILY  . gabapentin (NEURONTIN) 300 MG capsule Take 600 mg by mouth at bedtime.   Marland Kitchen ketoconazole (NIZORAL) 2 % shampoo Apply 1 application topically daily as needed (for skin fungus on neck/back).  . latanoprost (XALATAN) 0.005 % ophthalmic solution Place 1 drop into both eyes at bedtime.   . Multiple Vitamins-Minerals (MULTIVITAMIN WOMEN PO) Take by mouth.  . naproxen (NAPROSYN) 500 MG tablet Take 1 tablet (500 mg total) by mouth 2 (two) times daily with a meal.  . oxyCODONE-acetaminophen (PERCOCET/ROXICET) 5-325 MG tablet Take 1 tablet by mouth daily.  . rizatriptan (MAXALT-MLT) 10 MG disintegrating tablet DISSOLVE ONE TABLET BY MOUTH AS NEEDED FOR MIGRAINE. MAY REPEAT IN 2 HOURS IF NEEDED  . triamterene-hydrochlorothiazide (MAXZIDE-25) 37.5-25 MG tablet TAKE 1 TABLET BY MOUTH EVERY DAY AS NEEDED    PHQ 2/9 Scores 08/15/2018 06/23/2018 03/09/2018 01/04/2018  PHQ - 2 Score 0 0 0 0  PHQ- 9 Score 0 - 0 0  Exception Documentation - - - -    Physical Exam  Constitutional: She is oriented to person, place, and time. She appears well-developed. No distress.  HENT:  Head: Normocephalic and atraumatic.  Neck: Normal range of motion. Neck supple.  Cardiovascular: Normal rate, regular rhythm and normal heart sounds.  No murmur heard. Pulmonary/Chest: Effort normal and breath sounds normal. No respiratory distress. She has no wheezes.  Musculoskeletal: Normal range of motion.  Lymphadenopathy:    She has no cervical adenopathy.  Neurological: She is alert and oriented to person, place, and time.  Skin: Skin is warm and dry. No rash noted.  Psychiatric: She has a normal mood and affect. Her behavior is normal. Thought content normal.  Nursing note and vitals reviewed.   BP 132/70 (BP Location: Right Wrist, Patient Position: Sitting, Cuff Size: Normal)   Pulse 84   Ht 5\' 7"  (1.702 m)   Wt (!) 360 lb (163.3 kg)   SpO2  100%   BMI 56.38 kg/m   Assessment and Plan: 1. Prediabetes Continue to work on diet and weight - Comprehensive metabolic panel - Hemoglobin A1c  2. Mild peripheral edema More swelling due to sleeping in a recliner Will increase maxzide and check labs - CBC with Differential/Platelet - TSH - triamterene-hydrochlorothiazide (MAXZIDE) 75-50 MG tablet; Take 1 tablet by mouth daily.  Dispense: 30 tablet; Refill: 5  3. Environmental and seasonal allergies - Cetirizine HCl 10 MG CAPS; Take 1 capsule (10 mg total) by mouth at bedtime.  Dispense: 30 capsule; Refill: 5  4. Major depressive disorder with single episode, in full remission ([place]) resolved  5. B12 deficiency - cyanocobalamin ((VITAMIN B-12)) injection 1,000 mcg  6. Need for immunization against influenza - Flu Vaccine QUAD 36+ mos IM  Partially dictated using Editor, commissioning. Any errors are unintentional.  Halina Maidens, MD [facility]  08/15/2018

## 2018-08-15 NOTE — Telephone Encounter (Signed)
Patient called stating insurance no longer covers the Zyrtec that we called in today at appt. Told patient that she needs to contact her insurance and find out what alternatives they will cover and then we can send in the correct medication. She verbalized understanding and said she will call tomorrow with information.

## 2018-08-16 LAB — COMPREHENSIVE METABOLIC PANEL
ALBUMIN: 3.8 g/dL (ref 3.5–5.5)
ALK PHOS: 97 IU/L (ref 39–117)
ALT: 13 IU/L (ref 0–32)
AST: 15 IU/L (ref 0–40)
Albumin/Globulin Ratio: 1.5 (ref 1.2–2.2)
BILIRUBIN TOTAL: 0.3 mg/dL (ref 0.0–1.2)
BUN / CREAT RATIO: 13 (ref 9–23)
BUN: 13 mg/dL (ref 6–24)
CO2: 27 mmol/L (ref 20–29)
Calcium: 9.8 mg/dL (ref 8.7–10.2)
Chloride: 100 mmol/L (ref 96–106)
Creatinine, Ser: 0.98 mg/dL (ref 0.57–1.00)
GFR calc Af Amer: 78 mL/min/{1.73_m2} (ref >59)
GFR calc non Af Amer: 67 mL/min/{1.73_m2} (ref >59)
Globulin, Total: 2.6 g/dL (ref 1.5–4.5)
Glucose: 95 mg/dL (ref 65–99)
POTASSIUM: 3.5 mmol/L (ref 3.5–5.2)
Sodium: 142 mmol/L (ref 134–144)
Total Protein: 6.4 g/dL (ref 6.0–8.5)

## 2018-08-16 LAB — HEMOGLOBIN A1C
Est. average glucose Bld gHb Est-mCnc: 137 mg/dL
Hgb A1c MFr Bld: 6.4 % — ABNORMAL HIGH (ref 4.8–5.6)

## 2018-08-16 LAB — CBC WITH DIFFERENTIAL/PLATELET
BASOS ABS: 0 10*3/uL (ref 0.0–0.2)
BASOS: 0 %
EOS (ABSOLUTE): 0.2 10*3/uL (ref 0.0–0.4)
Eos: 4 %
HEMATOCRIT: 33.7 % — AB (ref 34.0–46.6)
HEMOGLOBIN: 10.7 g/dL — AB (ref 11.1–15.9)
Immature Grans (Abs): 0 10*3/uL (ref 0.0–0.1)
Immature Granulocytes: 0 %
LYMPHS ABS: 2.1 10*3/uL (ref 0.7–3.1)
Lymphs: 43 %
MCH: 25.7 pg — AB (ref 26.6–33.0)
MCHC: 31.8 g/dL (ref 31.5–35.7)
MCV: 81 fL (ref 79–97)
Monocytes Absolute: 0.4 10*3/uL (ref 0.1–0.9)
Monocytes: 9 %
NEUTROS ABS: 2.2 10*3/uL (ref 1.4–7.0)
Neutrophils: 44 %
Platelets: 298 10*3/uL (ref 150–450)
RBC: 4.16 x10E6/uL (ref 3.77–5.28)
RDW: 14.9 % (ref 12.3–15.4)
WBC: 4.9 10*3/uL (ref 3.4–10.8)

## 2018-08-16 LAB — TSH: TSH: 1.36 u[IU]/mL (ref 0.450–4.500)

## 2018-08-17 ENCOUNTER — Other Ambulatory Visit: Payer: Self-pay | Admitting: Internal Medicine

## 2018-08-17 ENCOUNTER — Telehealth: Payer: Self-pay

## 2018-08-17 DIAGNOSIS — J3089 Other allergic rhinitis: Secondary | ICD-10-CM

## 2018-08-17 MED ORDER — LEVOCETIRIZINE DIHYDROCHLORIDE 5 MG PO TABS: 5.0000 mg | ORAL_TABLET | Freq: Every evening | ORAL | 0 refills | Status: DC

## 2018-08-17 NOTE — Telephone Encounter (Signed)
Patient called and left VM saying she tried to call her insurance company and find out alternative we can send in besides Zyrtec and they would not give her the names of medications. They told her the doctor will have to send in medication before they can let her know if approved or denied.   Please Advise.   Needs alternative for Zyrtec.

## 2018-08-17 NOTE — Telephone Encounter (Signed)
I will try zyzal but I will not continue to send all the possible meds.  I believe that this class is just not covered by insurance since it is over the counter.

## 2018-08-17 NOTE — Telephone Encounter (Signed)
Patient informed that we cannot cont to send in different medications but told her Dr B sent Xyzal.

## 2018-08-18 ENCOUNTER — Encounter: Payer: Self-pay | Admitting: Dietician

## 2018-08-18 NOTE — Progress Notes (Signed)
Patient cancelled her follow-up visit on 07/22/18 due to having surgery. In checking her medical records, it seems she is planning to begin nutrition therapy elsewhere for now; sent a discharge letter to her referring provider.

## 2018-08-19 ENCOUNTER — Other Ambulatory Visit: Payer: Self-pay

## 2018-08-19 DIAGNOSIS — R635 Abnormal weight gain: Secondary | ICD-10-CM

## 2018-09-05 DIAGNOSIS — M5416 Radiculopathy, lumbar region: Secondary | ICD-10-CM | POA: Diagnosis not present

## 2018-09-05 DIAGNOSIS — F329 Major depressive disorder, single episode, unspecified: Secondary | ICD-10-CM | POA: Diagnosis not present

## 2018-09-05 DIAGNOSIS — G4733 Obstructive sleep apnea (adult) (pediatric): Secondary | ICD-10-CM | POA: Diagnosis not present

## 2018-09-05 DIAGNOSIS — F1120 Opioid dependence, uncomplicated: Secondary | ICD-10-CM | POA: Diagnosis not present

## 2018-09-14 ENCOUNTER — Ambulatory Visit (INDEPENDENT_AMBULATORY_CARE_PROVIDER_SITE_OTHER): Payer: Medicare HMO

## 2018-09-14 DIAGNOSIS — E538 Deficiency of other specified B group vitamins: Secondary | ICD-10-CM | POA: Diagnosis not present

## 2018-09-14 MED ORDER — CYANOCOBALAMIN 1000 MCG/ML IJ SOLN
1000.0000 ug | Freq: Once | INTRAMUSCULAR | Status: AC
  Administered 2018-09-14: 1000 ug via INTRAMUSCULAR

## 2018-09-14 NOTE — Progress Notes (Signed)
Patient came in today to receive her monthly B12 injection. Was given in right arm in [place]. Patient chose to stand during injection. Patient tolerated well. Will schedule 1 month nurse visit for next dose.

## 2018-09-24 ENCOUNTER — Other Ambulatory Visit: Payer: Self-pay | Admitting: Internal Medicine

## 2018-09-24 DIAGNOSIS — R600 Localized edema: Secondary | ICD-10-CM

## 2018-10-10 ENCOUNTER — Emergency Department: Payer: Medicare HMO

## 2018-10-10 ENCOUNTER — Encounter: Payer: Self-pay | Admitting: Emergency Medicine

## 2018-10-10 ENCOUNTER — Other Ambulatory Visit: Payer: Self-pay

## 2018-10-10 ENCOUNTER — Telehealth: Payer: Self-pay

## 2018-10-10 ENCOUNTER — Ambulatory Visit
Admission: RE | Admit: 2018-10-10 | Discharge: 2018-10-10 | Disposition: A | Discharge status: Home / Self Care | Payer: Medicare HMO | Source: Home / Self Care | Attending: Internal Medicine | Admitting: Internal Medicine

## 2018-10-10 ENCOUNTER — Emergency Department
Admission: EM | Admit: 2018-10-10 | Discharge: 2018-10-10 | Disposition: A | Discharge status: Home / Self Care | Payer: Medicare HMO | Attending: Emergency Medicine | Admitting: Emergency Medicine

## 2018-10-10 ENCOUNTER — Ambulatory Visit (INDEPENDENT_AMBULATORY_CARE_PROVIDER_SITE_OTHER): Payer: Medicare HMO | Admitting: Internal Medicine

## 2018-10-10 ENCOUNTER — Ambulatory Visit
Admission: RE | Admit: 2018-10-10 | Discharge: 2018-10-10 | Disposition: A | Discharge status: Home / Self Care | Payer: Medicare HMO | Source: Ambulatory Visit | Attending: Internal Medicine | Admitting: Internal Medicine

## 2018-10-10 ENCOUNTER — Encounter: Payer: Self-pay | Admitting: Internal Medicine

## 2018-10-10 VITALS — BP 118/68 | HR 101 | Ht 67.0 in | Wt 372.0 lb

## 2018-10-10 DIAGNOSIS — M25522 Pain in left elbow: Secondary | ICD-10-CM | POA: Diagnosis not present

## 2018-10-10 DIAGNOSIS — M7989 Other specified soft tissue disorders: Secondary | ICD-10-CM | POA: Diagnosis not present

## 2018-10-10 DIAGNOSIS — I10 Essential (primary) hypertension: Secondary | ICD-10-CM | POA: Diagnosis not present

## 2018-10-10 DIAGNOSIS — R0602 Shortness of breath: Secondary | ICD-10-CM | POA: Insufficient documentation

## 2018-10-10 DIAGNOSIS — M25512 Pain in left shoulder: Secondary | ICD-10-CM | POA: Diagnosis not present

## 2018-10-10 DIAGNOSIS — R600 Localized edema: Secondary | ICD-10-CM

## 2018-10-10 DIAGNOSIS — R609 Edema, unspecified: Secondary | ICD-10-CM | POA: Diagnosis not present

## 2018-10-10 DIAGNOSIS — R202 Paresthesia of skin: Secondary | ICD-10-CM | POA: Diagnosis not present

## 2018-10-10 DIAGNOSIS — R51 Headache: Secondary | ICD-10-CM | POA: Diagnosis not present

## 2018-10-10 DIAGNOSIS — R52 Pain, unspecified: Secondary | ICD-10-CM | POA: Diagnosis not present

## 2018-10-10 DIAGNOSIS — M79602 Pain in left arm: Secondary | ICD-10-CM | POA: Diagnosis not present

## 2018-10-10 DIAGNOSIS — R509 Fever, unspecified: Secondary | ICD-10-CM | POA: Diagnosis not present

## 2018-10-10 LAB — BASIC METABOLIC PANEL
Anion gap: 7 (ref 5–15)
BUN: 8 mg/dL (ref 6–20)
CALCIUM: 9.6 mg/dL (ref 8.9–10.3)
CHLORIDE: 108 mmol/L (ref 98–111)
CO2: 26 mmol/L (ref 22–32)
Creatinine, Ser: 0.72 mg/dL (ref 0.44–1.00)
GFR calc non Af Amer: >60 mL/min (ref >60)
Glucose, Bld: 87 mg/dL (ref 70–99)
Potassium: 3.7 mmol/L (ref 3.5–5.1)
SODIUM: 141 mmol/L (ref 135–145)

## 2018-10-10 LAB — CBC
HCT: 36 % (ref 36.0–46.0)
HEMOGLOBIN: 10.6 g/dL — AB (ref 12.0–15.0)
MCH: 24.8 pg — AB (ref 26.0–34.0)
MCHC: 29.4 g/dL — ABNORMAL LOW (ref 30.0–36.0)
MCV: 84.1 fL (ref 80.0–100.0)
NRBC: 0 % (ref 0.0–0.2)
Platelets: 263 10*3/uL (ref 150–400)
RBC: 4.28 MIL/uL (ref 3.87–5.11)
RDW: 16.6 % — ABNORMAL HIGH (ref 11.5–15.5)
WBC: 4.6 10*3/uL (ref 4.0–10.5)

## 2018-10-10 LAB — TROPONIN I: Troponin I: <0.03 ng/mL (ref <0.03)

## 2018-10-10 LAB — APTT: APTT: 25 s (ref 24–36)

## 2018-10-10 LAB — BRAIN NATRIURETIC PEPTIDE: B NATRIURETIC PEPTIDE 5: 10 pg/mL (ref 0.0–100.0)

## 2018-10-10 LAB — PROTIME-INR
INR: 1.04
Prothrombin Time: 13.5 seconds (ref 11.4–15.2)

## 2018-10-10 MED ORDER — OXYCODONE-ACETAMINOPHEN 5-325 MG PO TABS
2.0000 | ORAL_TABLET | Freq: Once | ORAL | Status: AC
  Administered 2018-10-10: 2 via ORAL
  Filled 2018-10-10: qty 2

## 2018-10-10 MED ORDER — POTASSIUM CHLORIDE ER 10 MEQ PO CPCR: 10.0000 meq | ORAL_CAPSULE | Freq: Two times a day (BID) | ORAL | 3 refills | Status: DC

## 2018-10-10 MED ORDER — IOHEXOL 350 MG/ML SOLN
75.0000 mL | Freq: Once | INTRAVENOUS | Status: AC | PRN
  Administered 2018-10-10: 75 mL via INTRAVENOUS

## 2018-10-10 MED ORDER — KETOROLAC TROMETHAMINE 30 MG/ML IJ SOLN
30.0000 mg | Freq: Once | INTRAMUSCULAR | Status: AC
  Administered 2018-10-10: 30 mg via INTRAVENOUS
  Filled 2018-10-10: qty 1

## 2018-10-10 MED ORDER — FUROSEMIDE 10 MG/ML IJ SOLN
40.0000 mg | Freq: Once | INTRAMUSCULAR | Status: AC
  Administered 2018-10-10: 40 mg via INTRAVENOUS
  Filled 2018-10-10: qty 4

## 2018-10-10 MED ORDER — FENTANYL CITRATE (PF) 100 MCG/2ML IJ SOLN
75.0000 ug | Freq: Once | INTRAMUSCULAR | Status: AC
  Administered 2018-10-10: 75 ug via INTRAVENOUS
  Filled 2018-10-10: qty 2

## 2018-10-10 MED ORDER — FUROSEMIDE 20 MG PO TABS: 20.0000 mg | ORAL_TABLET | Freq: Two times a day (BID) | ORAL | 3 refills | Status: DC

## 2018-10-10 NOTE — Progress Notes (Signed)
Date:  10/10/2018   Name:  Kimberly Cowan   DOB:  [birth year]   MRN:  [id number]   Chief Complaint: Edema (Legs and feet swelling.)  Edema - she is having progressive swelling in her legs, now becoming painful.  Taking HCTZ daily but still unable to elevate her legs - still sleeping in a recliner.  She was recently seen by [facility] - had vascular evaluation - no clots. She is short of breath but not wheezing, no cough.  Mostly just getting depressed over the ongoing pain.  Review of Systems  Constitutional: Negative for chills, fatigue and fever.  Respiratory: Positive for shortness of breath. Negative for cough, chest tightness and wheezing.   Cardiovascular: Positive for leg swelling. Negative for chest pain and palpitations.  Gastrointestinal: Negative for abdominal pain.  Neurological: Negative for dizziness and headaches.    Patient Active Problem List   Diagnosis Date Noted  . Major depressive disorder with single episode, in full remission ([place]) 08/15/2018  . Arm pain, left 05/17/2018  . Encounter for screening colonoscopy   . Mild peripheral edema 11/15/2017  . Perforated ear drum, left 06/25/2017  . History of pulmonary embolism 06/10/2017  . Environmental and seasonal allergies 04/07/2017  . Tinea versicolor 02/03/2017  . Vasomotor symptoms due to menopause 11/11/2016  . Morbid obesity ([place]) 09/10/2016  . B12 nutritional deficiency 08/05/2016  . Obstructive sleep apnea of adult 03/04/2016  . Bilateral lumbar radiculopathy 08/14/2015  . Status post gastric bypass for obesity 07/04/2015  . Prediabetes 07/01/2015  . Headache, migraine 04/04/2012    Allergies  Allergen Reactions  . Morphine And Related Hives, Shortness Of Breath, Nausea And Vomiting and Other (See Comments)    "can't breathe"  . Penicillins Anaphylaxis, Hives, Shortness Of Breath and Nausea And Vomiting    "can't breathe", Has patient had a PCN reaction causing immediate rash,  facial/tongue/throat swelling, SOB or lightheadedness with hypotension: Yes Has patient had a PCN reaction causing severe rash involving mucus membranes or skin necrosis: No Has patient had a PCN reaction that required hospitalization No Has patient had a PCN reaction occurring within the last 10 years: No If all of the above answers are "NO", then may proceed with Cephalosporin use.   . Latex Hives  . Baclofen Nausea Only    Jittery, anxious    Past Surgical History:  Procedure Laterality Date  . ABDOMINAL HYSTERECTOMY  10/2006  . BACK SURGERY  2014   lumbar fusion  . BACK SURGERY  2009   discectomy  . COLONOSCOPY WITH PROPOFOL N/A 04/12/2018   Procedure: COLONOSCOPY WITH PROPOFOL;  Surgeon: Lin Landsman, MD;  Location: [facility] ENDOSCOPY;  Service: Gastroenterology;  Laterality: N/A;  . GASTRIC BYPASS  2005  . KNEE ARTHROSCOPY WITH LATERAL MENISECTOMY Right 05/27/2017   Procedure: KNEE ARTHROSCOPY WITH LATERAL MENISECTOMY;  Surgeon: Hessie Knows, MD;  Location: [facility] ORS;  Service: Orthopedics;  Laterality: Right;  . KNEE ARTHROSCOPY WITH LATERAL RELEASE Right 05/27/2017   Procedure: KNEE ARTHROSCOPY WITH LATERAL RELEASE;  Surgeon: Hessie Knows, MD;  Location: [facility] ORS;  Service: Orthopedics;  Laterality: Right;  . LAPAROSCOPIC SALPINGO OOPHERECTOMY Bilateral 10/05/2016   Procedure: LAPAROSCOPIC SALPINGO OOPHORECTOMY;  Surgeon: Brayton Mars, MD;  Location: [facility] ORS;  Service: Gynecology;  Laterality: Bilateral;  . REDUCTION MAMMAPLASTY Bilateral    2001  . SPINAL CORD STIMULATOR INSERTION N/A 03/20/2016   Procedure: LUMBAR SPINAL CORD STIMULATOR INSERTION;  Surgeon: Eustace Moore, MD;  Location: [facility] NEURO ORS;  Service: Neurosurgery;  Laterality: N/A;  . TONSILLECTOMY      Social History   Tobacco Use  . Smoking status: Never Smoker  . Smokeless tobacco: Never Used  . Tobacco comment: smoking cessation materials not required  Substance Use Topics  . Alcohol use: No     Alcohol/week: 0.0 standard drinks  . Drug use: No     Medication list has been reviewed and updated.  Current Meds  Medication Sig  . BD INTEGRA SYRINGE 25G X 1" 3 ML MISC USE AS DIRECTED  . cyanocobalamin (,VITAMIN B-12,) 1000 MCG/ML injection cyanocobalamin (vit B-12) 1,000 mcg/mL injection solution  INJECT 1 ML INTO THE MUSCLE Q 30 DAYS  . cyclobenzaprine (FLEXERIL) 10 MG tablet Take 1 tablet by mouth 3 (three) times daily.  . ferrous sulfate 325 (65 FE) MG EC tablet Take 325 mg by mouth 3 (three) times daily with meals.  . fluticasone (FLONASE) 50 MCG/ACT nasal spray SHAKE LIQUID AND USE 2 SPRAYS IN EACH NOSTRIL DAILY  . gabapentin (NEURONTIN) 300 MG capsule Take 600 mg by mouth at bedtime.   Marland Kitchen ketoconazole (NIZORAL) 2 % shampoo Apply 1 application topically daily as needed (for skin fungus on neck/back).  . latanoprost (XALATAN) 0.005 % ophthalmic solution Place 1 drop into both eyes at bedtime.   Marland Kitchen levocetirizine (XYZAL) 5 MG tablet TAKE 1 TABLET(5 MG) BY MOUTH EVERY EVENING  . Multiple Vitamins-Minerals (MULTIVITAMIN WOMEN PO) Take by mouth.  . naproxen (NAPROSYN) 500 MG tablet Take 1 tablet (500 mg total) by mouth 2 (two) times daily with a meal.  . oxyCODONE-acetaminophen (PERCOCET/ROXICET) 5-325 MG tablet Take 1 tablet by mouth daily.  . rizatriptan (MAXALT-MLT) 10 MG disintegrating tablet DISSOLVE ONE TABLET BY MOUTH AS NEEDED FOR MIGRAINE. MAY REPEAT IN 2 HOURS IF NEEDED  . triamterene-hydrochlorothiazide (MAXZIDE) 75-50 MG tablet Take 1 tablet by mouth daily.    PHQ 2/9 Scores 08/15/2018 06/23/2018 03/09/2018 01/04/2018  PHQ - 2 Score 0 0 0 0  PHQ- 9 Score 0 - 0 0  Exception Documentation - - - -    Physical Exam Vitals signs and nursing note reviewed.  Constitutional:      General: She is not in acute distress.    Appearance: She is well-developed. She is obese.  HENT:     Head: Normocephalic and atraumatic.  Cardiovascular:     Rate and Rhythm: Normal rate and  regular rhythm.  Pulmonary:     Effort: Pulmonary effort is normal. No respiratory distress.     Breath sounds: Normal breath sounds. No wheezing, rhonchi or rales.  Musculoskeletal:     Right lower leg: Edema (2+ to the knees bilaterally) present.     Left lower leg: Edema present.  Skin:    General: Skin is warm and dry.     Findings: No rash.  Neurological:     Mental Status: She is alert and oriented to person, place, and time.  Psychiatric:        Mood and Affect: Mood is anxious.        Behavior: Behavior normal.        Thought Content: Thought content normal.     BP 118/68 (BP Location: Right Wrist, Patient Position: Sitting, Cuff Size: Normal)   Pulse (!) 101   Ht 5\' 7"  (1.702 m)   Wt (!) 372 lb (168.7 kg)   SpO2 95%   BMI 58.26 kg/m   Assessment and Plan: 1. Localized edema Stop HCTZ and get CXR, start  lasix Pt to follow up in 2 weeks Call to report progress in 3 days - furosemide (LASIX) 20 MG tablet; Take 1 tablet (20 mg total) by mouth 2 (two) times daily.  Dispense: 60 tablet; Refill: 3 - potassium chloride (MICRO-K) 10 MEQ CR capsule; Take 1 capsule (10 mEq total) by mouth 2 (two) times daily.  Dispense: 60 capsule; Refill: 3 - Comprehensive metabolic panel - DG Chest 2 View; Future  2. Shortness of breath - DG Chest 2 View; Future   Partially dictated using Editor, commissioning. Any errors are unintentional.  Halina Maidens, MD [facility]  10/10/2018

## 2018-10-10 NOTE — ED Triage Notes (Signed)
PT arrived with complaints of bilateral swelling and pain in both legs as well as left arm. Pt reports intermittent "numbness" of her left arm. Pt states the pain/swelling started in early November. PT was seen by PCP today who prescribed lasix but pt reports inability to take medications today. PT reports she became short of breath after seeing her PCP and states her arm pain/swelling is new since seeing her PCP this morning. PT in NAD in triage, breathing regular and unlabored.

## 2018-10-10 NOTE — Patient Instructions (Addendum)
Stop HCTZ  Call on Thursday morning to report your progress

## 2018-10-10 NOTE — Telephone Encounter (Signed)
Patient called saying she just left our office and she was waiting to get her Rx for pharmacy. Suddenly had left arm numbness and tingling along with swelling down into her hand. Asked if she should be evaluated at ER.  Called her back and told her to go ahead to ER to have a work up and make sure everything is okay.  She verbalized understanding.

## 2018-10-10 NOTE — ED Notes (Signed)
Patient transported to CT 

## 2018-10-10 NOTE — ED Provider Notes (Signed)
[facility] Provider Note  ____________________________________________  Time seen: Approximately 4:58 PM  I have reviewed the triage vital signs and the nursing notes.   HISTORY  Chief Complaint Leg Pain    HPI Kimberly Cowan is a 53 y.o. female with a history of PE, off Eliquis, presenting with left upper extremity pain, left upper extremity tingling, lower extremity swelling.  The patient reports that over last several weeks, she has had left greater than right lower extremity swelling.  She went to see her PCP this morning who prescribed Lasix for her.  Since then, she developed a pain in the left upper extremity with associated tingling.  She reports chronic unchanged headaches.  No changes in vision, speech or mental status.  No difficulty walking.  No weakness.  The patient does report some shortness of breath.  Past Medical History:  Diagnosis Date  . Anxiety   . Arthritis    lower spine  . Chronic back pain   . Clinical depression 04/04/2012  . Depression   . DVT (deep venous thrombosis) ([place])   . Glaucoma    RIGHT EYE  . Headache(784.0)    migraines  . Ovarian cyst   . Pneumonia    walking pneumonia  . Sleep apnea    no cpcap     last sleep study > 4 yrs    Patient Active Problem List   Diagnosis Date Noted  . Major depressive disorder with single episode, in full remission ([place]) 08/15/2018  . Arm pain, left 05/17/2018  . Encounter for screening colonoscopy   . Mild peripheral edema 11/15/2017  . Perforated ear drum, left 06/25/2017  . History of pulmonary embolism 06/10/2017  . Environmental and seasonal allergies 04/07/2017  . Tinea versicolor 02/03/2017  . Vasomotor symptoms due to menopause 11/11/2016  . Morbid obesity ([place]) 09/10/2016  . B12 nutritional deficiency 08/05/2016  . Obstructive sleep apnea of adult 03/04/2016  . Bilateral lumbar radiculopathy 08/14/2015  . Status post gastric bypass  for obesity 07/04/2015  . Prediabetes 07/01/2015  . Headache, migraine 04/04/2012    Past Surgical History:  Procedure Laterality Date  . ABDOMINAL HYSTERECTOMY  10/2006  . BACK SURGERY  2014   lumbar fusion  . BACK SURGERY  2009   discectomy  . COLONOSCOPY WITH PROPOFOL N/A 04/12/2018   Procedure: COLONOSCOPY WITH PROPOFOL;  Surgeon: Lin Landsman, MD;  Location: [facility] ENDOSCOPY;  Service: Gastroenterology;  Laterality: N/A;  . GASTRIC BYPASS  2005  . KNEE ARTHROSCOPY WITH LATERAL MENISECTOMY Right 05/27/2017   Procedure: KNEE ARTHROSCOPY WITH LATERAL MENISECTOMY;  Surgeon: Hessie Knows, MD;  Location: [facility] ORS;  Service: Orthopedics;  Laterality: Right;  . KNEE ARTHROSCOPY WITH LATERAL RELEASE Right 05/27/2017   Procedure: KNEE ARTHROSCOPY WITH LATERAL RELEASE;  Surgeon: Hessie Knows, MD;  Location: [facility] ORS;  Service: Orthopedics;  Laterality: Right;  . LAPAROSCOPIC SALPINGO OOPHERECTOMY Bilateral 10/05/2016   Procedure: LAPAROSCOPIC SALPINGO OOPHORECTOMY;  Surgeon: Brayton Mars, MD;  Location: [facility] ORS;  Service: Gynecology;  Laterality: Bilateral;  . REDUCTION MAMMAPLASTY Bilateral    2001  . SPINAL CORD STIMULATOR INSERTION N/A 03/20/2016   Procedure: LUMBAR SPINAL CORD STIMULATOR INSERTION;  Surgeon: Eustace Moore, MD;  Location: [place]UR[facility] ORS;  Service: Neurosurgery;  Laterality: N/A;  . TONSILLECTOMY      Current Outpatient Rx  . Order #: [id number] Class: Normal  . Order #: [id number] Class: Historical Med  . Order #: [id number] Class: Historical Med  . Order #: [id number] Class:  Historical Med  . Order #: [id number] Class: Normal  . Order #: [id number] Class: Normal  . Order #: [id number] Class: Historical Med  . Order #: [id number] Class: Normal  . Order #: [id number] Class: Historical Med  . Order #: [id number] Class: Normal  . Order #: [id number] Class: Historical Med  . Order #: [id number] Class: Print  . Order #: [id number] Class: Historical Med  . Order #: [id number] Class:  Normal  . Order #: [id number] Class: Normal  . Order #: [id number] Class: Normal    Allergies Morphine and related; Penicillins; Latex; and Baclofen  Family History  Problem Relation Age of Onset  . Diabetes Mother   . Hypertension Mother   . Diabetes Sister   . Hypertension Sister   . Hypertension Father   . Breast cancer Neg Hx     Social History Social History   Tobacco Use  . Smoking status: Never Smoker  . Smokeless tobacco: Never Used  . Tobacco comment: smoking cessation materials not required  Substance Use Topics  . Alcohol use: No    Alcohol/week: 0.0 standard drinks  . Drug use: No    Review of Systems Constitutional: No fever/chills.  No lightheadedness or syncope. Eyes: No visual changes. ENT: No sore throat. No congestion or rhinorrhea. Cardiovascular: Denies chest pain. Denies palpitations. Respiratory: Denies shortness of breath.  No cough. Gastrointestinal: No abdominal pain.  No nausea, no vomiting.  No diarrhea.  No constipation. Genitourinary: Negative for dysuria. Musculoskeletal: Negative for back pain.  Positive left upper extremity pain.  As of bilateral lower extremity left greater than right edema. Skin: Negative for rash. Neurological: Positive headaches.  Positive left upper extremity numbness.    ____________________________________________   PHYSICAL EXAM:  VITAL SIGNS: ED Triage Vitals  Enc Vitals Group     BP 10/10/18 1323 (!) 144/81     Pulse Rate 10/10/18 1323 92     Resp 10/10/18 1323 20     Temp 10/10/18 1323 98.3 F (36.8 C)     Temp Source 10/10/18 1323 Oral     SpO2 10/10/18 1323 98 %     Weight 10/10/18 1324 (!) 382 lb (173.3 kg)     Height 10/10/18 1324 5\' 7"  (1.702 m)     Head Circumference --      Peak Flow --      Pain Score 10/10/18 1323 7     Pain Loc --      Pain Edu? --      Excl. in [place]? --     Constitutional: Alert and oriented.  Answers questions appropriately.  Tearful on exam. Eyes: Conjunctivae are  normal.  EOMI. No scleral icterus. Head: Atraumatic. Nose: No congestion/rhinnorhea. Mouth/Throat: Mucous membranes are moist.  Neck: No stridor.  Supple.  No JVD.  No meningismus. Cardiovascular: Normal rate, regular rhythm. No murmurs, rubs or gallops.  Respiratory: Normal respiratory effort.  No accessory muscle use or retractions. Lungs CTAB.  No wheezes, rales or ronchi. Gastrointestinal: Morbidly obese.  Soft, nontender and nondistended.  No guarding or rebound.  No peritoneal signs. Musculoskeletal: Positive left greater than right lower extremity edema that is pitting to just below the knee without any overlying erythema or warmth.. No ttp in the calves or palpable cords.  The patient has severe pain with any movement of the left shoulder and elbow and will not let me move them.  There is no overlying skin change, and I do not feel any palpable instability.  The patient has not had any trauma.  Negative Homan's sign. Neurologic:  A&Ox3.  Speech is clear.  Face and smile are symmetric.  EOMI.  Moves all extremities well. Skin:  Skin is warm, dry and intact. No rash noted. Psychiatric: Anxious affect.  ____________________________________________   LABS (all labs ordered are listed, but only abnormal results are displayed)  Labs Reviewed  CBC - Abnormal; Notable for the following components:      Result Value   Hemoglobin 10.6 (*)    MCH 24.8 (*)    MCHC 29.4 (*)    RDW 16.6 (*)    All other components within normal limits  BASIC METABOLIC PANEL  TROPONIN I  BRAIN NATRIURETIC PEPTIDE   ____________________________________________  EKG  ED ECG REPORT I, Anne-Caroline Mariea Clonts, the attending physician, personally viewed and interpreted this ECG.   Date: 10/10/2018  EKG Time: 1331  Rate: 97  Rhythm: normal sinus rhythm  Axis: normal  Intervals:none  ST&T Change: No STEMI  ____________________________________________  RADIOLOGY  Dg Chest 2 View  Result Date:  10/10/2018 CLINICAL DATA:  Shortness of breath for 2 months EXAM: CHEST - 2 VIEW COMPARISON:  06/09/2017 FINDINGS: Cardiac shadow is stable. Lungs are well aerated bilaterally without focal infiltrate or sizable effusion. Spinal stimulator is noted midthoracic spine. No focal bony abnormality is seen. IMPRESSION: No active cardiopulmonary disease. Electronically Signed   By: Inez Catalina M.D.   On: 10/10/2018 13:52    ____________________________________________   PROCEDURES  Procedure(s) performed: None  Procedures  Critical Care performed: No ____________________________________________   INITIAL IMPRESSION / ASSESSMENT AND PLAN / ED COURSE  Pertinent labs & imaging results that were available during my care of the patient were reviewed by me and considered in my medical decision making (see chart for details).  53 y.o. female with a history of PE no longer anticoagulated presenting with bilateral lower extremity swelling, left upper extremity pain and tingling.  On my exam, the patient does have bilateral lower extremity edema and we will get ultrasounds to rule out DVT given that she has a prior history of clots and has not been on Eliquis.  We will also get a CT of the chest to rule out PE.  I will get a CT of the head to rule out CVA although the patient's significant amount of pain in the left upper extremity is not consistent with CVA.  Without trauma, it is difficult to explain the amount of pain the patient has in the left upper extremity.  I will plan to treat her with pain medication and reevaluate her for final disposition.  The patient will be signed out to the oncoming provider for final disposition.  ____________________________________________  FINAL CLINICAL IMPRESSION(S) / ED DIAGNOSES  Final diagnoses:  Bilateral lower extremity edema  Left arm pain  Tingling  Shortness of breath         NEW MEDICATIONS STARTED DURING THIS VISIT:  New Prescriptions   No  medications on file      Eula Listen, MD 10/10/18 1744

## 2018-10-10 NOTE — ED Provider Notes (Signed)
Patient received in sign-out from Dr. Mariea Clonts.  Workup and evaluation pending follow up on imaging and reassessment.  Patient reassessed.  Has had some improvement in symptoms.  Blood work is reassuring.  No evidence of PE.  Not consistent with dissection.  States that she has had history of some more pain issues in the past.  Follows up with pain clinic.  Given reassuring findings I do believe patient appropriate for continued management as an outpatient.      Kimberly Lot, MD 10/10/18 Sharilyn Sites

## 2018-10-11 ENCOUNTER — Other Ambulatory Visit: Payer: Self-pay | Admitting: Internal Medicine

## 2018-10-17 ENCOUNTER — Ambulatory Visit: Payer: Medicare HMO

## 2018-10-17 ENCOUNTER — Ambulatory Visit (INDEPENDENT_AMBULATORY_CARE_PROVIDER_SITE_OTHER): Payer: Medicare HMO | Admitting: Internal Medicine

## 2018-10-17 ENCOUNTER — Encounter: Payer: Self-pay | Admitting: Internal Medicine

## 2018-10-17 VITALS — BP 126/86 | HR 95 | Temp 98.6°F | Ht 67.0 in | Wt 353.0 lb

## 2018-10-17 DIAGNOSIS — R600 Localized edema: Secondary | ICD-10-CM

## 2018-10-17 DIAGNOSIS — J208 Acute bronchitis due to other specified organisms: Secondary | ICD-10-CM

## 2018-10-17 DIAGNOSIS — R609 Edema, unspecified: Secondary | ICD-10-CM

## 2018-10-17 DIAGNOSIS — E538 Deficiency of other specified B group vitamins: Secondary | ICD-10-CM | POA: Diagnosis not present

## 2018-10-17 MED ORDER — CYANOCOBALAMIN 1000 MCG/ML IJ SOLN
1000.0000 ug | Freq: Once | INTRAMUSCULAR | Status: AC
  Administered 2018-10-17: 1000 ug via INTRAMUSCULAR

## 2018-10-17 MED ORDER — GUAIFENESIN-CODEINE 100-10 MG/5ML PO SYRP: 5.0000 mL | ORAL_SOLUTION | Freq: Three times a day (TID) | ORAL | 0 refills | Status: DC | PRN

## 2018-10-17 NOTE — Patient Instructions (Signed)
Drink warm liquids to help calm the throat irritation.

## 2018-10-17 NOTE — Progress Notes (Signed)
Date:  10/17/2018   Name:  Kimberly Cowan   DOB:  1965/12/22   MRN:  [id number]   Chief Complaint: Fever (Fever with cough. Started Monday since leaving Hospital. SOB. Vomiting due to cough. ) and b12 deficiency (B12 injection needed today. )  Cough  The current episode started in the past 7 days. The problem has been unchanged. The problem occurs every few minutes. The cough is non-productive. Associated symptoms include a fever and postnasal drip. Pertinent negatives include no chest pain, chills, headaches, shortness of breath or wheezing. She has tried OTC cough suppressant for the symptoms. The treatment provided mild relief.   Edema - much improved after starting Lasix.  ED trip last week for arm tingling - ruled out for cardiac source.  Doing better, weight down 20 lbs.  Leg are much leg swollen and no longer tender.   Review of Systems  Constitutional: Positive for fatigue and fever. Negative for chills.  HENT: Positive for postnasal drip.   Eyes: Negative for visual disturbance.  Respiratory: Positive for cough. Negative for shortness of breath and wheezing.   Cardiovascular: Positive for leg swelling (much improved). Negative for chest pain and palpitations.  Neurological: Negative for dizziness and headaches.    Patient Active Problem List   Diagnosis Date Noted  . Major depressive disorder with single episode, in full remission ([place]) 08/15/2018  . Arm pain, left 05/17/2018  . Encounter for screening colonoscopy   . Mild peripheral edema 11/15/2017  . Perforated ear drum, left 06/25/2017  . History of pulmonary embolism 06/10/2017  . Environmental and seasonal allergies 04/07/2017  . Tinea versicolor 02/03/2017  . Vasomotor symptoms due to menopause 11/11/2016  . Morbid obesity ([place]) 09/10/2016  . B12 nutritional deficiency 08/05/2016  . Obstructive sleep apnea of adult 03/04/2016  . Bilateral lumbar radiculopathy 08/14/2015  . Status post gastric  bypass for obesity 07/04/2015  . Prediabetes 07/01/2015  . Headache, migraine 04/04/2012    Allergies  Allergen Reactions  . Morphine And Related Hives, Shortness Of Breath, Nausea And Vomiting and Other (See Comments)    "can't breathe"  . Penicillins Anaphylaxis, Hives, Shortness Of Breath and Nausea And Vomiting    "can't breathe", Has patient had a PCN reaction causing immediate rash, facial/tongue/throat swelling, SOB or lightheadedness with hypotension: Yes Has patient had a PCN reaction causing severe rash involving mucus membranes or skin necrosis: No Has patient had a PCN reaction that required hospitalization No Has patient had a PCN reaction occurring within the last 10 years: No If all of the above answers are "NO", then may proceed with Cephalosporin use.   . Latex Hives  . Baclofen Nausea Only    Jittery, anxious    Past Surgical History:  Procedure Laterality Date  . ABDOMINAL HYSTERECTOMY  10/2006  . BACK SURGERY  2014   lumbar fusion  . BACK SURGERY  2009   discectomy  . COLONOSCOPY WITH PROPOFOL N/A 04/12/2018   Procedure: COLONOSCOPY WITH PROPOFOL;  Surgeon: Lin Landsman, MD;  Location[facility];  Service: Gastroenterology;  Laterality: N/A;  . GASTRIC BYPASS  2005  . KNEE ARTHROSCOPY WITH LATERAL MENISECTOMY Right 05/27/2017   Procedure: KNEE ARTHROSCOPY WITH LATERAL MENISECTOMY;  Surgeon: Hessie Knows, MD;  Location: [facility] ORS;  Service: Orthopedics;  Laterality: Right;  . KNEE ARTHROSCOPY WITH LATERAL RELEASE Right 05/27/2017   Procedure: KNEE ARTHROSCOPY WITH LATERAL RELEASE;  Surgeon: Hessie Knows, MD;  Location: [facility] ORS;  Service: Orthopedics;  Laterality: Right;  .  LAPAROSCOPIC SALPINGO OOPHERECTOMY Bilateral 10/05/2016   Procedure: LAPAROSCOPIC SALPINGO OOPHORECTOMY;  Surgeon: Brayton Mars, MD;  Location: [facility] ORS;  Service: Gynecology;  Laterality: Bilateral;  . REDUCTION MAMMAPLASTY Bilateral    2001  . SPINAL CORD STIMULATOR  INSERTION N/A 03/20/2016   Procedure: LUMBAR SPINAL CORD STIMULATOR INSERTION;  Surgeon: Eustace Moore, MD;  Location: [place] NEURO ORS;  Service: Neurosurgery;  Laterality: N/A;  . TONSILLECTOMY      Social History   Tobacco Use  . Smoking status: Never Smoker  . Smokeless tobacco: Never Used  . Tobacco comment: smoking cessation materials not required  Substance Use Topics  . Alcohol use: No    Alcohol/week: 0.0 standard drinks  . Drug use: No     Medication list has been reviewed and updated.  Current Meds  Medication Sig  . BD INTEGRA SYRINGE 25G X 1" 3 ML MISC USE AS DIRECTED  . cetirizine (ZYRTEC) 10 MG tablet TAKE 1 TABLET BY MOUTH DAILY  . cyanocobalamin (,VITAMIN B-12,) 1000 MCG/ML injection cyanocobalamin (vit B-12) 1,000 mcg/mL injection solution  INJECT 1 ML INTO THE MUSCLE Q 30 DAYS  . cyclobenzaprine (FLEXERIL) 10 MG tablet Take 1 tablet by mouth 3 (three) times daily.  . ferrous sulfate 325 (65 FE) MG EC tablet Take 325 mg by mouth 3 (three) times daily with meals.  . fluticasone (FLONASE) 50 MCG/ACT nasal spray SHAKE LIQUID AND USE 2 SPRAYS IN EACH NOSTRIL DAILY  . furosemide (LASIX) 20 MG tablet Take 1 tablet (20 mg total) by mouth 2 (two) times daily.  Marland Kitchen gabapentin (NEURONTIN) 300 MG capsule Take 600 mg by mouth at bedtime.   Marland Kitchen ketoconazole (NIZORAL) 2 % shampoo Apply 1 application topically daily as needed (for skin fungus on neck/back).  . latanoprost (XALATAN) 0.005 % ophthalmic solution Place 1 drop into both eyes at bedtime.   Marland Kitchen levocetirizine (XYZAL) 5 MG tablet TAKE 1 TABLET(5 MG) BY MOUTH EVERY EVENING  . Multiple Vitamins-Minerals (MULTIVITAMIN WOMEN PO) Take by mouth.  . naproxen (NAPROSYN) 500 MG tablet Take 1 tablet (500 mg total) by mouth 2 (two) times daily with a meal.  . oxyCODONE-acetaminophen (PERCOCET/ROXICET) 5-325 MG tablet Take 1 tablet by mouth daily.  . potassium chloride (MICRO-K) 10 MEQ CR capsule Take 1 capsule (10 mEq total) by mouth 2  (two) times daily.  . rizatriptan (MAXALT-MLT) 10 MG disintegrating tablet DISSOLVE ONE TABLET BY MOUTH AS NEEDED FOR MIGRAINE. MAY REPEAT IN 2 HOURS IF NEEDED  . triamterene-hydrochlorothiazide (MAXZIDE) 75-50 MG tablet Take 1 tablet by mouth daily.    PHQ 2/9 Scores 08/15/2018 06/23/2018 03/09/2018 01/04/2018  PHQ - 2 Score 0 0 0 0  PHQ- 9 Score 0 - 0 0  Exception Documentation - - - -   Wt Readings from Last 3 Encounters:  10/17/18 (!) 353 lb (160.1 kg)  10/10/18 (!) 382 lb (173.3 kg)  10/10/18 (!) 372 lb (168.7 kg)    Physical Exam Vitals signs and nursing note reviewed.  Constitutional:      General: She is not in acute distress.    Appearance: She is well-developed.  HENT:     Head: Normocephalic and atraumatic.     Right Ear: Tympanic membrane and ear canal normal.     Left Ear: Tympanic membrane and ear canal normal.     Nose:     Right Sinus: No maxillary sinus tenderness.     Left Sinus: No maxillary sinus tenderness.     Mouth/Throat:  Pharynx: No posterior oropharyngeal erythema.     Tonsils: No tonsillar exudate.  Neck:     Musculoskeletal: Normal range of motion and neck supple.  Cardiovascular:     Rate and Rhythm: Normal rate and regular rhythm.  Pulmonary:     Effort: Pulmonary effort is normal. No respiratory distress.     Breath sounds: Decreased breath sounds present. No wheezing or rhonchi.  Musculoskeletal: Normal range of motion.     Right lower leg: Edema (1+ edema) present.     Left lower leg: Edema (1+ edema) present.  Skin:    General: Skin is warm and dry.     Findings: No rash.  Neurological:     Mental Status: She is alert and oriented to person, place, and time.  Psychiatric:        Behavior: Behavior normal.        Thought Content: Thought content normal.     BP 126/86   Pulse 95   Temp 98.6 F (37 C) (Oral)   Ht 5\' 7"  (1.702 m)   Wt (!) 353 lb (160.1 kg)   SpO2 98%   BMI 55.29 kg/m   Assessment and Plan: 1. Acute viral  bronchitis Supportive care with warm liquids, tylenol for fever, rest - guaiFENesin-codeine (ROBITUSSIN AC) 100-10 MG/5ML syrup; Take 5 mLs by mouth 3 (three) times daily as needed for cough.  Dispense: 118 mL; Refill: 0  2. B12 nutritional deficiency  3. Mild peripheral edema Much improved with lasix therapy Will check labs at follow up next week   Partially dictated using [place]. Any errors are unintentional.  Halina Maidens, MD [facility]  10/17/2018

## 2018-10-25 ENCOUNTER — Ambulatory Visit (INDEPENDENT_AMBULATORY_CARE_PROVIDER_SITE_OTHER): Payer: Medicare HMO | Admitting: Internal Medicine

## 2018-10-25 ENCOUNTER — Encounter: Payer: Self-pay | Admitting: Internal Medicine

## 2018-10-25 ENCOUNTER — Other Ambulatory Visit: Payer: Self-pay

## 2018-10-25 VITALS — BP 118/64 | HR 100 | Ht 67.0 in | Wt 355.0 lb

## 2018-10-25 DIAGNOSIS — R609 Edema, unspecified: Secondary | ICD-10-CM

## 2018-10-25 DIAGNOSIS — R600 Localized edema: Secondary | ICD-10-CM

## 2018-10-25 DIAGNOSIS — J40 Bronchitis, not specified as acute or chronic: Secondary | ICD-10-CM | POA: Diagnosis not present

## 2018-10-25 MED ORDER — AZITHROMYCIN 250 MG PO TABS: ORAL_TABLET | ORAL | 0 refills | Status: AC

## 2018-10-25 NOTE — Progress Notes (Signed)
Date:  10/25/2018   Name:  Kimberly Cowan   DOB:  07/25/1966   MRN:  [id number]   Chief Complaint: Edema  Edema - started on lasix for peripheral edema 2 weeks ago.  Last week weight was down 20 lbs.  She continues on lasix twice a day with no cramps or lightheadedness. Her weight is back up 2 lbs. Cough - she was given cough syrup for viral bronchitis last week.  She is still coughing and now bringing up green thick phlegm.  No SOB or fever.  Still using cough suppressant.  Review of Systems  Constitutional: Negative for chills, fatigue and fever.  HENT: Negative for sinus pressure and trouble swallowing.   Respiratory: Positive for cough. Negative for chest tightness and wheezing.   Cardiovascular: Positive for leg swelling. Negative for chest pain and palpitations.  Gastrointestinal: Negative for abdominal pain.  Neurological: Negative for dizziness, light-headedness and headaches.    Patient Active Problem List   Diagnosis Date Noted  . Major depressive disorder with single episode, in full remission ([place]) 08/15/2018  . Arm pain, left 05/17/2018  . Encounter for screening colonoscopy   . Mild peripheral edema 11/15/2017  . Perforated ear drum, left 06/25/2017  . History of pulmonary embolism 06/10/2017  . Environmental and seasonal allergies 04/07/2017  . Tinea versicolor 02/03/2017  . Vasomotor symptoms due to menopause 11/11/2016  . Morbid obesity ([place]) 09/10/2016  . B12 nutritional deficiency 08/05/2016  . Obstructive sleep apnea of adult 03/04/2016  . Bilateral lumbar radiculopathy 08/14/2015  . Status post gastric bypass for obesity 07/04/2015  . Prediabetes 07/01/2015  . Headache, migraine 04/04/2012    Allergies  Allergen Reactions  . Morphine And Related Hives, Shortness Of Breath, Nausea And Vomiting and Other (See Comments)    "can't breathe"  . Penicillins Anaphylaxis, Hives, Shortness Of Breath and Nausea And Vomiting    "can't breathe",  Has patient had a PCN reaction causing immediate rash, facial/tongue/throat swelling, SOB or lightheadedness with hypotension: Yes Has patient had a PCN reaction causing severe rash involving mucus membranes or skin necrosis: No Has patient had a PCN reaction that required hospitalization No Has patient had a PCN reaction occurring within the last 10 years: No If all of the above answers are "NO", then may proceed with Cephalosporin use.   . Latex Hives  . Baclofen Nausea Only    Jittery, anxious    Past Surgical History:  Procedure Laterality Date  . ABDOMINAL HYSTERECTOMY  10/2006  . BACK SURGERY  2014   lumbar fusion  . BACK SURGERY  2009   discectomy  . COLONOSCOPY WITH PROPOFOL N/A 04/12/2018   Procedure: COLONOSCOPY WITH PROPOFOL;  Surgeon: Lin Landsman, MD;  Location: [facility] ENDOSCOPY;  Service: Gastroenterology;  Laterality: N/A;  . GASTRIC BYPASS  2005  . KNEE ARTHROSCOPY WITH LATERAL MENISECTOMY Right 05/27/2017   Procedure: KNEE ARTHROSCOPY WITH LATERAL MENISECTOMY;  Surgeon: Hessie Knows, MD;  Location: [facility] ORS;  Service: Orthopedics;  Laterality: Right;  . KNEE ARTHROSCOPY WITH LATERAL RELEASE Right 05/27/2017   Procedure: KNEE ARTHROSCOPY WITH LATERAL RELEASE;  Surgeon: Hessie Knows, MD;  Location: [facility] ORS;  Service: Orthopedics;  Laterality: Right;  . LAPAROSCOPIC SALPINGO OOPHERECTOMY Bilateral 10/05/2016   Procedure: LAPAROSCOPIC SALPINGO OOPHORECTOMY;  Surgeon: Brayton Mars, MD;  Location: [facility] ORS;  Service: Gynecology;  Laterality: Bilateral;  . REDUCTION MAMMAPLASTY Bilateral    2001  . SPINAL CORD STIMULATOR INSERTION N/A 03/20/2016   Procedure: LUMBAR SPINAL CORD  STIMULATOR INSERTION;  Surgeon: Eustace Moore, MD;  Location: [facility] NEURO ORS;  Service: Neurosurgery;  Laterality: N/A;  . TONSILLECTOMY      Social History   Tobacco Use  . Smoking status: Never Smoker  . Smokeless tobacco: Never Used  . Tobacco comment: smoking cessation materials not  required  Substance Use Topics  . Alcohol use: No    Alcohol/week: 0.0 standard drinks  . Drug use: No     Medication list has been reviewed and updated.  Current Meds  Medication Sig  . BD INTEGRA SYRINGE 25G X 1" 3 ML MISC USE AS DIRECTED  . cetirizine (ZYRTEC) 10 MG tablet TAKE 1 TABLET BY MOUTH DAILY  . cyanocobalamin (,VITAMIN B-12,) 1000 MCG/ML injection cyanocobalamin (vit B-12) 1,000 mcg/mL injection solution  INJECT 1 ML INTO THE MUSCLE Q 30 DAYS  . cyclobenzaprine (FLEXERIL) 10 MG tablet Take 1 tablet by mouth 3 (three) times daily.  . ferrous sulfate 325 (65 FE) MG EC tablet Take 325 mg by mouth 3 (three) times daily with meals.  . fluticasone (FLONASE) 50 MCG/ACT nasal spray SHAKE LIQUID AND USE 2 SPRAYS IN EACH NOSTRIL DAILY  . furosemide (LASIX) 20 MG tablet Take 1 tablet (20 mg total) by mouth 2 (two) times daily.  Marland Kitchen gabapentin (NEURONTIN) 300 MG capsule Take 600 mg by mouth at bedtime.   Marland Kitchen ketoconazole (NIZORAL) 2 % shampoo Apply 1 application topically daily as needed (for skin fungus on neck/back).  . latanoprost (XALATAN) 0.005 % ophthalmic solution Place 1 drop into both eyes at bedtime.   Marland Kitchen levocetirizine (XYZAL) 5 MG tablet TAKE 1 TABLET(5 MG) BY MOUTH EVERY EVENING  . Multiple Vitamins-Minerals (MULTIVITAMIN WOMEN PO) Take by mouth.  . naproxen (NAPROSYN) 500 MG tablet Take 1 tablet (500 mg total) by mouth 2 (two) times daily with a meal.  . oxyCODONE-acetaminophen (PERCOCET/ROXICET) 5-325 MG tablet Take 1 tablet by mouth daily.  . potassium chloride (MICRO-K) 10 MEQ CR capsule Take 1 capsule (10 mEq total) by mouth 2 (two) times daily.  . rizatriptan (MAXALT-MLT) 10 MG disintegrating tablet DISSOLVE ONE TABLET BY MOUTH AS NEEDED FOR MIGRAINE. MAY REPEAT IN 2 HOURS IF NEEDED  . triamterene-hydrochlorothiazide (MAXZIDE) 75-50 MG tablet Take 1 tablet by mouth daily.    PHQ 2/9 Scores 08/15/2018 06/23/2018 03/09/2018 01/04/2018  PHQ - 2 Score 0 0 0 0  PHQ- 9 Score 0  - 0 0  Exception Documentation - - - -   Wt Readings from Last 3 Encounters:  10/25/18 (!) 355 lb (161 kg)  10/17/18 (!) 353 lb (160.1 kg)  10/10/18 (!) 382 lb (173.3 kg)    Physical Exam Vitals signs and nursing note reviewed.  Constitutional:      General: She is not in acute distress.    Appearance: She is well-developed.  HENT:     Head: Normocephalic and atraumatic.  Neck:     Musculoskeletal: Normal range of motion and neck supple.  Cardiovascular:     Rate and Rhythm: Normal rate and regular rhythm.  Pulmonary:     Effort: Pulmonary effort is normal. No respiratory distress.     Breath sounds: Normal breath sounds. No wheezing or rhonchi.     Comments: Frequent loose cough noted Musculoskeletal: Normal range of motion.     Right lower leg: Edema (trace) present.     Left lower leg: Edema (trace) present.  Skin:    General: Skin is warm and dry.     Findings: No  rash.  Neurological:     Mental Status: She is alert and oriented to person, place, and time.  Psychiatric:        Behavior: Behavior normal.        Thought Content: Thought content normal.     BP 118/64   Pulse 100   Ht 5\' 7"  (1.702 m)   Wt (!) 355 lb (161 kg)   SpO2 96%   BMI 55.60 kg/m   Assessment and Plan: 1. Mild peripheral edema Continue lasix bid May add HCTZ if labs are normal - Basic metabolic panel  2. Bronchitis Failed conservative treatment - azithromycin (ZITHROMAX Z-PAK) 250 MG tablet; UAD  Dispense: 6 each; Refill: 0   Partially dictated using Editor, commissioning. Any errors are unintentional.  Halina Maidens, MD [facility]  10/25/2018

## 2018-10-26 LAB — BASIC METABOLIC PANEL
BUN/Creatinine Ratio: 9 (ref 9–23)
BUN: 7 mg/dL (ref 6–24)
CO2: 25 mmol/L (ref 20–29)
Calcium: 9.7 mg/dL (ref 8.7–10.2)
Chloride: 101 mmol/L (ref 96–106)
Creatinine, Ser: 0.78 mg/dL (ref 0.57–1.00)
GFR calc Af Amer: 103 mL/min/{1.73_m2} (ref >59)
GFR, EST NON AFRICAN AMERICAN: 89 mL/min/{1.73_m2} (ref >59)
Glucose: 102 mg/dL — ABNORMAL HIGH (ref 65–99)
Potassium: 4 mmol/L (ref 3.5–5.2)
Sodium: 141 mmol/L (ref 134–144)

## 2018-10-27 ENCOUNTER — Telehealth: Payer: Self-pay

## 2018-10-27 NOTE — Telephone Encounter (Signed)
10/26/2018 Spoke with patient about resource for replacing full dentures.[facility]ed [email or web address]

## 2018-11-09 DIAGNOSIS — H524 Presbyopia: Secondary | ICD-10-CM | POA: Diagnosis not present

## 2018-11-09 DIAGNOSIS — H40153 Residual stage of open-angle glaucoma, bilateral: Secondary | ICD-10-CM | POA: Diagnosis not present

## 2018-11-10 DIAGNOSIS — G4733 Obstructive sleep apnea (adult) (pediatric): Secondary | ICD-10-CM | POA: Diagnosis not present

## 2018-11-10 DIAGNOSIS — M4800 Spinal stenosis, site unspecified: Secondary | ICD-10-CM | POA: Diagnosis not present

## 2018-11-11 ENCOUNTER — Other Ambulatory Visit: Payer: Self-pay | Admitting: Internal Medicine

## 2018-11-11 DIAGNOSIS — J0100 Acute maxillary sinusitis, unspecified: Secondary | ICD-10-CM

## 2018-11-17 ENCOUNTER — Ambulatory Visit (INDEPENDENT_AMBULATORY_CARE_PROVIDER_SITE_OTHER): Payer: Medicare HMO

## 2018-11-17 DIAGNOSIS — E538 Deficiency of other specified B group vitamins: Secondary | ICD-10-CM

## 2018-11-17 MED ORDER — CYANOCOBALAMIN 1000 MCG/ML IJ SOLN
1000.0000 ug | Freq: Once | INTRAMUSCULAR | Status: AC
  Administered 2018-11-17: 1000 ug via INTRAMUSCULAR

## 2018-12-02 DIAGNOSIS — M48061 Spinal stenosis, lumbar region without neurogenic claudication: Secondary | ICD-10-CM | POA: Diagnosis not present

## 2018-12-02 DIAGNOSIS — M961 Postlaminectomy syndrome, not elsewhere classified: Secondary | ICD-10-CM | POA: Diagnosis not present

## 2018-12-02 DIAGNOSIS — F419 Anxiety disorder, unspecified: Secondary | ICD-10-CM | POA: Diagnosis not present

## 2018-12-02 DIAGNOSIS — M5416 Radiculopathy, lumbar region: Secondary | ICD-10-CM | POA: Diagnosis not present

## 2018-12-19 ENCOUNTER — Other Ambulatory Visit: Payer: Self-pay

## 2018-12-19 ENCOUNTER — Ambulatory Visit (INDEPENDENT_AMBULATORY_CARE_PROVIDER_SITE_OTHER): Payer: Medicare HMO

## 2018-12-19 DIAGNOSIS — E538 Deficiency of other specified B group vitamins: Secondary | ICD-10-CM | POA: Diagnosis not present

## 2018-12-19 MED ORDER — CYANOCOBALAMIN 1000 MCG/ML IJ SOLN
1000.0000 ug | Freq: Once | INTRAMUSCULAR | Status: AC
  Administered 2018-12-19: 1000 ug via INTRAMUSCULAR

## 2018-12-20 DIAGNOSIS — G4733 Obstructive sleep apnea (adult) (pediatric): Secondary | ICD-10-CM | POA: Diagnosis not present

## 2019-01-05 ENCOUNTER — Other Ambulatory Visit: Payer: Self-pay | Admitting: Internal Medicine

## 2019-01-05 DIAGNOSIS — R600 Localized edema: Secondary | ICD-10-CM

## 2019-01-09 ENCOUNTER — Other Ambulatory Visit: Payer: Self-pay

## 2019-01-09 ENCOUNTER — Encounter: Payer: Self-pay | Admitting: Internal Medicine

## 2019-01-09 ENCOUNTER — Ambulatory Visit (INDEPENDENT_AMBULATORY_CARE_PROVIDER_SITE_OTHER): Payer: Medicare HMO | Admitting: Internal Medicine

## 2019-01-09 VITALS — BP 128/70 | HR 89 | Ht 67.0 in | Wt 358.0 lb

## 2019-01-09 DIAGNOSIS — Z0000 Encounter for general adult medical examination without abnormal findings: Secondary | ICD-10-CM | POA: Diagnosis not present

## 2019-01-09 DIAGNOSIS — M5416 Radiculopathy, lumbar region: Secondary | ICD-10-CM

## 2019-01-09 DIAGNOSIS — G4733 Obstructive sleep apnea (adult) (pediatric): Secondary | ICD-10-CM | POA: Diagnosis not present

## 2019-01-09 DIAGNOSIS — R7303 Prediabetes: Secondary | ICD-10-CM

## 2019-01-09 DIAGNOSIS — F325 Major depressive disorder, single episode, in full remission: Secondary | ICD-10-CM

## 2019-01-09 DIAGNOSIS — Z23 Encounter for immunization: Secondary | ICD-10-CM

## 2019-01-09 DIAGNOSIS — Z1231 Encounter for screening mammogram for malignant neoplasm of breast: Secondary | ICD-10-CM

## 2019-01-09 LAB — POCT URINALYSIS DIPSTICK
Bilirubin, UA: NEGATIVE
Blood, UA: NEGATIVE
Glucose, UA: NEGATIVE
Ketones, UA: NEGATIVE
Leukocytes, UA: NEGATIVE
Nitrite, UA: NEGATIVE
Protein, UA: NEGATIVE
Spec Grav, UA: <=1.005 — AB (ref 1.010–1.025)
Urobilinogen, UA: 0.2 E.U./dL
pH, UA: 5 (ref 5.0–8.0)

## 2019-01-09 MED ORDER — ZOSTER VAC RECOMB ADJUVANTED 50 MCG/0.5ML IM SUSR: 0.5000 mL | Freq: Once | INTRAMUSCULAR | 1 refills | Status: AC

## 2019-01-09 NOTE — Patient Instructions (Signed)
This information is directly available on the CDC website: https://www.cdc.gov/coronavirus/2019-ncov/if-you-are-sick/steps-when-sick.html    Source:CDC Reference to specific commercial products, manufacturers, companies, or trademarks does not constitute its endorsement or recommendation by the U.S. Government, Department of Health and Human Services, or Centers for Disease Control and Prevention.  

## 2019-01-09 NOTE — Progress Notes (Signed)
Date:  01/09/2019   Name:  Kimberly Cowan   DOB:  June 11, 1966   MRN:  [id number]   Chief Complaint: Annual Exam (Breast Exam. ) Kimberly Cowan is a 53 y.o. female who presents today for her Complete Annual Exam. She feels fairly well. She reports exercising very little due to back pain. She reports she is sleeping well on CPAP.  She has tried to see Bariatrics at [facility] but they needed notes from [facility] gastric bypass surgery.  She has not heard back and has left several messages.  Last mammogram 01/2018 Last Colonoscopy 03/2018 No pap needed Diabetes  She presents for her follow-up diabetic visit. Diabetes type: pre-diabetes. Her disease course has been stable. Pertinent negatives for hypoglycemia include no dizziness, headaches, nervousness/anxiousness or tremors. Pertinent negatives for diabetes include no chest pain, no fatigue, no polydipsia and no polyuria.  Depression         The problem has been resolved since onset.  Associated symptoms include no fatigue and no headaches.  Past treatments include SNRIs - Serotonin and norepinephrine reuptake inhibitors.  Compliance with treatment is good.  Previous treatment provided significant relief. Migraine   This is a recurrent problem. The problem occurs seasonly. The problem has been unchanged. Pertinent negatives include no abdominal pain, coughing, dizziness, fever, hearing loss, tinnitus or vomiting. She has tried triptans for the symptoms. The treatment provided significant relief.  Lumbar radiculopathy - being followed by Ortho and pain management.  On Flexeril, Neurontin, Percocet. OSA - on Cpap nightly with benefit, sleeps well.  Lab Results  Component Value Date   HGBA1C 6.4 (H) 08/15/2018   Lab Results  Component Value Date   CREATININE 0.78 10/25/2018   BUN 7 10/25/2018   NA 141 10/25/2018   K 4.0 10/25/2018   CL 101 10/25/2018   CO2 25 10/25/2018   Lab Results  Component Value Date   CHOL 142  01/04/2018   HDL 62 01/04/2018   LDLCALC 64 01/04/2018   TRIG 82 01/04/2018   CHOLHDL 2.3 01/04/2018    Review of Systems  Constitutional: Negative for chills, fatigue, fever and unexpected weight change.  HENT: Negative for congestion, hearing loss, tinnitus, trouble swallowing and voice change.   Eyes: Negative for visual disturbance.  Respiratory: Negative for cough, chest tightness, shortness of breath and wheezing.   Cardiovascular: Negative for chest pain, palpitations and leg swelling.  Gastrointestinal: Negative for abdominal pain, constipation, diarrhea and vomiting.  Endocrine: Negative for polydipsia and polyuria.  Genitourinary: Negative for dysuria, frequency, genital sores, vaginal bleeding and vaginal discharge.  Musculoskeletal: Negative for arthralgias, gait problem and joint swelling.  Skin: Negative for color change and rash.  Allergic/Immunologic: Positive for environmental allergies.  Neurological: Negative for dizziness, tremors, light-headedness and headaches.  Hematological: Negative for adenopathy. Bruises/bleeds easily.  Psychiatric/Behavioral: Positive for depression. Negative for dysphoric mood and sleep disturbance. The patient is not nervous/anxious.     Patient Active Problem List   Diagnosis Date Noted  . Major depressive disorder with single episode, in full remission ([place]) 08/15/2018  . Arm pain, left 05/17/2018  . Encounter for screening colonoscopy   . Mild peripheral edema 11/15/2017  . Perforated ear drum, left 06/25/2017  . History of pulmonary embolism 06/10/2017  . Environmental and seasonal allergies 04/07/2017  . Tinea versicolor 02/03/2017  . Vasomotor symptoms due to menopause 11/11/2016  . Morbid obesity ([place]) 09/10/2016  . B12 nutritional deficiency 08/05/2016  . Obstructive sleep apnea of adult 03/04/2016  .  Bilateral lumbar radiculopathy 08/14/2015  . Status post gastric bypass for obesity 07/04/2015  . Prediabetes 07/01/2015   . Headache, migraine 04/04/2012    Allergies  Allergen Reactions  . Morphine And Related Hives, Shortness Of Breath, Nausea And Vomiting and Other (See Comments)    "can't breathe"  . Penicillins Anaphylaxis, Hives, Shortness Of Breath and Nausea And Vomiting    "can't breathe", Has patient had a PCN reaction causing immediate rash, facial/tongue/throat swelling, SOB or lightheadedness with hypotension: Yes Has patient had a PCN reaction causing severe rash involving mucus membranes or skin necrosis: No Has patient had a PCN reaction that required hospitalization No Has patient had a PCN reaction occurring within the last 10 years: No If all of the above answers are "NO", then may proceed with Cephalosporin use.   . Latex Hives  . Baclofen Nausea Only    Jittery, anxious    Past Surgical History:  Procedure Laterality Date  . ABDOMINAL HYSTERECTOMY  10/2006  . BACK SURGERY  2014   lumbar fusion  . BACK SURGERY  2009   discectomy  . COLONOSCOPY WITH PROPOFOL N/A 04/12/2018   Procedure: COLONOSCOPY WITH PROPOFOL;  Surgeon: Lin Landsman, MD;  Location[facility] ENDOSCOPY;  Service: Gastroenterology;  Laterality: N/A;  . GASTRIC BYPASS  2005  . KNEE ARTHROSCOPY WITH LATERAL MENISECTOMY Right 05/27/2017   Procedure: KNEE ARTHROSCOPY WITH LATERAL MENISECTOMY;  Surgeon: Hessie Knows, MD;  Location: [facility] ORS;  Service: Orthopedics;  Laterality: Right;  . KNEE ARTHROSCOPY WITH LATERAL RELEASE Right 05/27/2017   Procedure: KNEE ARTHROSCOPY WITH LATERAL RELEASE;  Surgeon: Hessie Knows, MD;  Location: [facility] ORS;  Service: Orthopedics;  Laterality: Right;  . LAPAROSCOPIC SALPINGO OOPHERECTOMY Bilateral 10/05/2016   Procedure: LAPAROSCOPIC SALPINGO OOPHORECTOMY;  Surgeon: Brayton Mars, MD;  Location: [facility] ORS;  Service: Gynecology;  Laterality: Bilateral;  . REDUCTION MAMMAPLASTY Bilateral    2001  . SPINAL CORD STIMULATOR INSERTION N/A 03/20/2016   Procedure: LUMBAR SPINAL CORD  STIMULATOR INSERTION;  Surgeon: Eustace Moore, MD;  Location: [place] NEURO ORS;  Service: Neurosurgery;  Laterality: N/A;  . TONSILLECTOMY      Social History   Tobacco Use  . Smoking status: Never Smoker  . Smokeless tobacco: Never Used  . Tobacco comment: smoking cessation materials not required  Substance Use Topics  . Alcohol use: No    Alcohol/week: 0.0 standard drinks  . Drug use: No     Medication list has been reviewed and updated.  Current Meds  Medication Sig  . BD INTEGRA SYRINGE 25G X 1" 3 ML MISC USE AS DIRECTED  . cyanocobalamin (,VITAMIN B-12,) 1000 MCG/ML injection cyanocobalamin (vit B-12) 1,000 mcg/mL injection solution  INJECT 1 ML INTO THE MUSCLE Q 30 DAYS  . cyclobenzaprine (FLEXERIL) 10 MG tablet Take 1 tablet by mouth 3 (three) times daily.  . DULoxetine (CYMBALTA) 30 MG capsule Take 1 capsule by mouth daily.  . fluticasone (FLONASE) 50 MCG/ACT nasal spray SHAKE LIQUID AND USE 2 SPRAYS IN EACH NOSTRIL DAILY  . furosemide (LASIX) 20 MG tablet TAKE 1 TABLET(20 MG) BY MOUTH TWICE DAILY  . gabapentin (NEURONTIN) 300 MG capsule Take 600 mg by mouth at bedtime.   Marland Kitchen ketoconazole (NIZORAL) 2 % shampoo Apply 1 application topically daily as needed (for skin fungus on neck/back).  . latanoprost (XALATAN) 0.005 % ophthalmic solution Place 1 drop into both eyes at bedtime.   Marland Kitchen levocetirizine (XYZAL) 5 MG tablet TAKE 1 TABLET(5 MG) BY MOUTH EVERY EVENING  .  naproxen (NAPROSYN) 500 MG tablet Take 1 tablet (500 mg total) by mouth 2 (two) times daily with a meal.  . oxyCODONE-acetaminophen (PERCOCET/ROXICET) 5-325 MG tablet Take 1 tablet by mouth daily.  . potassium chloride (MICRO-K) 10 MEQ CR capsule TAKE 1 CAPSULE(10 MEQ) BY MOUTH TWICE DAILY  . rizatriptan (MAXALT-MLT) 10 MG disintegrating tablet DISSOLVE ONE TABLET BY MOUTH AS NEEDED FOR MIGRAINE. MAY REPEAT IN 2 HOURS IF NEEDED  . triamterene-hydrochlorothiazide (MAXZIDE) 75-50 MG tablet Take 1 tablet by mouth daily.     PHQ 2/9 Scores 01/09/2019 08/15/2018 06/23/2018 03/09/2018  PHQ - 2 Score 0 0 0 0  PHQ- 9 Score - 0 - 0  Exception Documentation - - - -    BP Readings from Last 3 Encounters:  01/09/19 128/70  10/25/18 118/64  10/17/18 126/86    Physical Exam Vitals signs and nursing note reviewed.  Constitutional:      General: She is not in acute distress.    Appearance: She is well-developed. She is obese.  HENT:     Head: Normocephalic and atraumatic.     Right Ear: Tympanic membrane and ear canal normal.     Left Ear: Tympanic membrane and ear canal normal.     Nose:     Right Sinus: No maxillary sinus tenderness.     Left Sinus: No maxillary sinus tenderness.     Mouth/Throat:     Pharynx: Uvula midline.  Eyes:     General: No scleral icterus.       Right eye: No discharge.        Left eye: No discharge.     Conjunctiva/sclera: Conjunctivae normal.  Neck:     Musculoskeletal: Normal range of motion. No erythema.     Thyroid: No thyromegaly.     Vascular: No carotid bruit.  Cardiovascular:     Rate and Rhythm: Normal rate and regular rhythm.     Pulses: Normal pulses.     Heart sounds: Normal heart sounds.  Pulmonary:     Effort: Pulmonary effort is normal. No respiratory distress.     Breath sounds: No wheezing.  Chest:     Breasts:        Right: Skin change present. No mass, nipple discharge or tenderness.        Left: Skin change present. No mass, nipple discharge or tenderness.     Comments: Bilateral breast reduction scars Abdominal:     General: Bowel sounds are normal.     Palpations: Abdomen is soft.     Tenderness: There is no abdominal tenderness.  Musculoskeletal:     Right lower leg: Edema present.     Left lower leg: Edema present.     Comments: Lumbar spine not examined  Lymphadenopathy:     Cervical: No cervical adenopathy.  Skin:    General: Skin is warm and dry.     Findings: No rash.  Neurological:     Mental Status: She is alert and oriented to  person, place, and time.     Cranial Nerves: No cranial nerve deficit.     Sensory: No sensory deficit.     Deep Tendon Reflexes: Reflexes are normal and symmetric.  Psychiatric:        Speech: Speech normal.        Behavior: Behavior normal.        Thought Content: Thought content normal.     Wt Readings from Last 3 Encounters:  01/09/19 (!) 358 lb (162.4 kg)  10/25/18 (!) 355 lb (161 kg)  10/17/18 (!) 353 lb (160.1 kg)    BP 128/70   Pulse 89   Ht 5\' 7"  (1.702 m)   Wt (!) 358 lb (162.4 kg)   SpO2 95%   BMI 56.07 kg/m   Assessment and Plan: 1. Annual physical exam Normal exam except for weight - Lipid panel - POCT urinalysis dipstick  2. Encounter for screening mammogram for breast cancer Schedule in May if able - MM 3D SCREEN BREAST BILATERAL; Future  3. Prediabetes Work on low carb diet to prevent progression to DM - Comprehensive metabolic panel - Hemoglobin A1c  4. Major depressive disorder with single episode, in full remission ([place]) Resolved per patient - TSH  5. Obstructive sleep apnea of adult Good CPAP compliance - CBC with Differential/Platelet  6. Bilateral lumbar radiculopathy Followed by pain management Spinal stimulator in place  7. Need for shingles vaccine - Zoster Vaccine Adjuvanted [facility]) injection; Inject 0.5 mLs into the muscle once for 1 dose.  Dispense: 0.5 mL; Refill: 1  8. Morbid obesity [facility]) Had been referred to Bariatric Surgery at [facility], they were waiting for records from [facility] to schedule follow up but pt has never heard back - rec emailing or continuing to call    Partially dictated using Editor, commissioning. Any errors are unintentional.  Halina Maidens, MD [facility]  01/09/2019

## 2019-01-10 LAB — LIPID PANEL
Chol/HDL Ratio: 2.2 ratio (ref 0.0–4.4)
Cholesterol, Total: 135 mg/dL (ref 100–199)
HDL: 62 mg/dL (ref >39)
LDL Calculated: 58 mg/dL (ref 0–99)
Triglycerides: 76 mg/dL (ref 0–149)
VLDL Cholesterol Cal: 15 mg/dL (ref 5–40)

## 2019-01-10 LAB — CBC WITH DIFFERENTIAL/PLATELET
Basophils Absolute: 0 10*3/uL (ref 0.0–0.2)
Basos: 1 %
EOS (ABSOLUTE): 0.3 10*3/uL (ref 0.0–0.4)
Eos: 6 %
Hematocrit: 34.9 % (ref 34.0–46.6)
Hemoglobin: 10.8 g/dL — ABNORMAL LOW (ref 11.1–15.9)
Immature Grans (Abs): 0 10*3/uL (ref 0.0–0.1)
Immature Granulocytes: 0 %
Lymphocytes Absolute: 2.6 10*3/uL (ref 0.7–3.1)
Lymphs: 54 %
MCH: 24.7 pg — ABNORMAL LOW (ref 26.6–33.0)
MCHC: 30.9 g/dL — ABNORMAL LOW (ref 31.5–35.7)
MCV: 80 fL (ref 79–97)
Monocytes Absolute: 0.4 10*3/uL (ref 0.1–0.9)
Monocytes: 8 %
Neutrophils Absolute: 1.5 10*3/uL (ref 1.4–7.0)
Neutrophils: 31 %
Platelets: 301 10*3/uL (ref 150–450)
RBC: 4.38 x10E6/uL (ref 3.77–5.28)
RDW: 16.4 % — ABNORMAL HIGH (ref 11.7–15.4)
WBC: 4.7 10*3/uL (ref 3.4–10.8)

## 2019-01-10 LAB — COMPREHENSIVE METABOLIC PANEL
ALT: 12 IU/L (ref 0–32)
AST: 18 IU/L (ref 0–40)
Albumin/Globulin Ratio: 1.4 (ref 1.2–2.2)
Albumin: 4 g/dL (ref 3.8–4.8)
Alkaline Phosphatase: 92 IU/L (ref 39–117)
BUN/Creatinine Ratio: 11 (ref 9–23)
BUN: 8 mg/dL (ref 6–24)
Bilirubin Total: <0.2 mg/dL (ref 0.0–1.2)
CO2: 22 mmol/L (ref 20–29)
Calcium: 9.8 mg/dL (ref 8.7–10.2)
Chloride: 104 mmol/L (ref 96–106)
Creatinine, Ser: 0.73 mg/dL (ref 0.57–1.00)
GFR calc Af Amer: 111 mL/min/{1.73_m2} (ref >59)
GFR calc non Af Amer: 96 mL/min/{1.73_m2} (ref >59)
Globulin, Total: 2.9 g/dL (ref 1.5–4.5)
Glucose: 92 mg/dL (ref 65–99)
Potassium: 4.1 mmol/L (ref 3.5–5.2)
Sodium: 141 mmol/L (ref 134–144)
Total Protein: 6.9 g/dL (ref 6.0–8.5)

## 2019-01-10 LAB — TSH: TSH: 2.68 u[IU]/mL (ref 0.450–4.500)

## 2019-01-10 LAB — HEMOGLOBIN A1C
Est. average glucose Bld gHb Est-mCnc: 134 mg/dL
Hgb A1c MFr Bld: 6.3 % — ABNORMAL HIGH (ref 4.8–5.6)

## 2019-01-19 ENCOUNTER — Ambulatory Visit (INDEPENDENT_AMBULATORY_CARE_PROVIDER_SITE_OTHER): Payer: Medicare HMO

## 2019-01-19 ENCOUNTER — Other Ambulatory Visit: Payer: Self-pay

## 2019-01-19 DIAGNOSIS — E538 Deficiency of other specified B group vitamins: Secondary | ICD-10-CM

## 2019-01-19 MED ORDER — CYANOCOBALAMIN 1000 MCG/ML IJ SOLN
1000.0000 ug | Freq: Once | INTRAMUSCULAR | Status: AC
  Administered 2019-01-19: 1000 ug via INTRAMUSCULAR

## 2019-01-30 ENCOUNTER — Other Ambulatory Visit: Payer: Self-pay | Admitting: Internal Medicine

## 2019-01-30 DIAGNOSIS — R609 Edema, unspecified: Principal | ICD-10-CM

## 2019-01-30 DIAGNOSIS — R600 Localized edema: Secondary | ICD-10-CM

## 2019-02-04 ENCOUNTER — Other Ambulatory Visit: Payer: Self-pay | Admitting: Internal Medicine

## 2019-02-04 DIAGNOSIS — J3089 Other allergic rhinitis: Secondary | ICD-10-CM

## 2019-02-09 DIAGNOSIS — H40153 Residual stage of open-angle glaucoma, bilateral: Secondary | ICD-10-CM | POA: Diagnosis not present

## 2019-02-21 ENCOUNTER — Encounter: Payer: Self-pay | Admitting: Internal Medicine

## 2019-02-21 ENCOUNTER — Ambulatory Visit (INDEPENDENT_AMBULATORY_CARE_PROVIDER_SITE_OTHER): Payer: 59 | Admitting: Internal Medicine

## 2019-02-21 ENCOUNTER — Other Ambulatory Visit: Payer: Medicare HMO

## 2019-02-21 ENCOUNTER — Other Ambulatory Visit: Payer: Self-pay

## 2019-02-21 VITALS — BP 130/78 | HR 94 | Ht 67.0 in | Wt 357.0 lb

## 2019-02-21 DIAGNOSIS — E538 Deficiency of other specified B group vitamins: Secondary | ICD-10-CM

## 2019-02-21 DIAGNOSIS — M25561 Pain in right knee: Secondary | ICD-10-CM | POA: Diagnosis not present

## 2019-02-21 MED ORDER — CYANOCOBALAMIN 1000 MCG/ML IJ SOLN
1000.0000 ug | Freq: Once | INTRAMUSCULAR | Status: AC
  Administered 2019-02-21: 1000 ug via INTRAMUSCULAR

## 2019-02-21 NOTE — Patient Instructions (Signed)
[facility] - walk in clinic 9 AM - 7:30 PM

## 2019-02-21 NOTE — Progress Notes (Signed)
Date:  02/21/2019   Name:  Kimberly Cowan   DOB:  10-Mar-1966   MRN:  [id number]   Chief Complaint: Knee Pain (Right knee pain. Fell 2 weeks ago and knee is not getting any better. Seen Dr. Rudene Christians- ortho- last in august of last year. Pt says she doesn't want to continue her care with him. Says he has no "bed side manner." Would like to see [facility] instead. )  Knee Pain   The incident occurred more than 1 week ago. The incident occurred at home. The injury mechanism was a direct blow. The pain is present in the right knee. The pain is moderate. Treatments tried: percocet.  Hip Pain   The incident occurred more than 1 week ago. The incident occurred at home. The injury mechanism was a direct blow (fell onto right side). The pain is present in the right leg, right hip and right knee. The pain is moderate.    Review of Systems  Constitutional: Negative for chills, fatigue and fever.  Respiratory: Negative for shortness of breath and wheezing.   Cardiovascular: Negative for chest pain, palpitations and leg swelling.  Musculoskeletal: Positive for arthralgias and gait problem. Negative for joint swelling.  Neurological: Negative for dizziness and headaches.    Patient Active Problem List   Diagnosis Date Noted  . Major depressive disorder with single episode, in full remission ([place]) 08/15/2018  . Arm pain, left 05/17/2018  . Encounter for screening colonoscopy   . Mild peripheral edema 11/15/2017  . Perforated ear drum, left 06/25/2017  . History of pulmonary embolism 06/10/2017  . Environmental and seasonal allergies 04/07/2017  . Tinea versicolor 02/03/2017  . Vasomotor symptoms due to menopause 11/11/2016  . Morbid obesity ([place]) 09/10/2016  . B12 nutritional deficiency 08/05/2016  . Obstructive sleep apnea of adult 03/04/2016  . Bilateral lumbar radiculopathy 08/14/2015  . Status post gastric bypass for obesity 07/04/2015  . Prediabetes 07/01/2015  . Headache,  migraine 04/04/2012    Allergies  Allergen Reactions  . Morphine And Related Hives, Shortness Of Breath, Nausea And Vomiting and Other (See Comments)    "can't breathe"  . Penicillins Anaphylaxis, Hives, Shortness Of Breath and Nausea And Vomiting    "can't breathe", Has patient had a PCN reaction causing immediate rash, facial/tongue/throat swelling, SOB or lightheadedness with hypotension: Yes Has patient had a PCN reaction causing severe rash involving mucus membranes or skin necrosis: No Has patient had a PCN reaction that required hospitalization No Has patient had a PCN reaction occurring within the last 10 years: No If all of the above answers are "NO", then may proceed with Cephalosporin use.   . Latex Hives  . Baclofen Nausea Only    Jittery, anxious    Past Surgical History:  Procedure Laterality Date  . ABDOMINAL HYSTERECTOMY  10/2006  . BACK SURGERY  2014   lumbar fusion  . BACK SURGERY  2009   discectomy  . COLONOSCOPY WITH PROPOFOL N/A 04/12/2018   Procedure: COLONOSCOPY WITH PROPOFOL;  Surgeon: Lin Landsman, MD;  Location[facility]COPY;  Service: Gastroenterology;  Laterality: N/A;  . GASTRIC BYPASS  2005  . KNEE ARTHROSCOPY WITH LATERAL MENISECTOMY Right 05/27/2017   Procedure: KNEE ARTHROSCOPY WITH LATERAL MENISECTOMY;  Surgeon: Hessie Knows, MD;  Location: [facility] ORS;  Service: Orthopedics;  Laterality: Right;  . KNEE ARTHROSCOPY WITH LATERAL RELEASE Right 05/27/2017   Procedure: KNEE ARTHROSCOPY WITH LATERAL RELEASE;  Surgeon: Hessie Knows, MD;  Location: [facility] ORS;  Service:  Orthopedics;  Laterality: Right;  . LAPAROSCOPIC SALPINGO OOPHERECTOMY Bilateral 10/05/2016   Procedure: LAPAROSCOPIC SALPINGO OOPHORECTOMY;  Surgeon: Brayton Mars, MD;  Location: [facility] ORS;  Service: Gynecology;  Laterality: Bilateral;  . REDUCTION MAMMAPLASTY Bilateral    2001  . SPINAL CORD STIMULATOR INSERTION N/A 03/20/2016   Procedure: LUMBAR SPINAL CORD STIMULATOR  INSERTION;  Surgeon: Eustace Moore, MD;  Location: [facility];  Service: Neurosurgery;  Laterality: N/A;  . TONSILLECTOMY      Social History   Tobacco Use  . Smoking status: Never Smoker  . Smokeless tobacco: Never Used  . Tobacco comment: smoking cessation materials not required  Substance Use Topics  . Alcohol use: No    Alcohol/week: 0.0 standard drinks  . Drug use: No     Medication list has been reviewed and updated.  Current Meds  Medication Sig  . BD INTEGRA SYRINGE 25G X 1" 3 ML MISC USE AS DIRECTED  . cyanocobalamin (,VITAMIN B-12,) 1000 MCG/ML injection cyanocobalamin (vit B-12) 1,000 mcg/mL injection solution  INJECT 1 ML INTO THE MUSCLE Q 30 DAYS  . cyclobenzaprine (FLEXERIL) 10 MG tablet Take 1 tablet by mouth 3 (three) times daily.  . DULoxetine (CYMBALTA) 30 MG capsule Take 1 capsule by mouth daily.  . ferrous sulfate 325 (65 FE) MG EC tablet Take 325 mg by mouth 3 (three) times daily with meals.  . fluticasone (FLONASE) 50 MCG/ACT nasal spray SHAKE LIQUID AND USE 2 SPRAYS IN EACH NOSTRIL DAILY  . furosemide (LASIX) 20 MG tablet TAKE 1 TABLET(20 MG) BY MOUTH TWICE DAILY  . gabapentin (NEURONTIN) 300 MG capsule Take 600 mg by mouth at bedtime.   Marland Kitchen ketoconazole (NIZORAL) 2 % shampoo Apply 1 application topically daily as needed (for skin fungus on neck/back).  . latanoprost (XALATAN) 0.005 % ophthalmic solution Place 1 drop into both eyes at bedtime.   Marland Kitchen levocetirizine (XYZAL) 5 MG tablet TAKE 1 TABLET(5 MG) BY MOUTH EVERY EVENING  . naproxen (NAPROSYN) 500 MG tablet Take 1 tablet (500 mg total) by mouth 2 (two) times daily with a meal.  . oxyCODONE-acetaminophen (PERCOCET/ROXICET) 5-325 MG tablet Take 1 tablet by mouth daily.  . potassium chloride (MICRO-K) 10 MEQ CR capsule TAKE 1 CAPSULE(10 MEQ) BY MOUTH TWICE DAILY  . rizatriptan (MAXALT-MLT) 10 MG disintegrating tablet DISSOLVE ONE TABLET BY MOUTH AS NEEDED FOR MIGRAINE. MAY REPEAT IN 2 HOURS IF NEEDED  .  triamterene-hydrochlorothiazide (MAXZIDE) 75-50 MG tablet TAKE 1 TABLET BY MOUTH DAILY  . [DISCONTINUED] levocetirizine (XYZAL) 5 MG tablet TAKE 1 TABLET(5 MG) BY MOUTH EVERY EVENING    PHQ 2/9 Scores 02/21/2019 01/09/2019 08/15/2018 06/23/2018  PHQ - 2 Score 0 0 0 0  PHQ- 9 Score - - 0 -  Exception Documentation - - - -    BP Readings from Last 3 Encounters:  02/21/19 130/78  01/09/19 128/70  10/25/18 118/64    Physical Exam Vitals signs and nursing note reviewed.  Constitutional:      General: She is not in acute distress.    Appearance: She is well-developed.  HENT:     Head: Normocephalic and atraumatic.  Neck:     Musculoskeletal: Normal range of motion.  Cardiovascular:     Rate and Rhythm: Normal rate and regular rhythm.  Pulmonary:     Effort: Pulmonary effort is normal. No respiratory distress.     Breath sounds: Normal breath sounds.  Musculoskeletal: Normal range of motion.     Right hip: She exhibits tenderness.  She exhibits normal range of motion.     Right knee: She exhibits no swelling and no effusion. Tenderness found. Medial joint line and lateral joint line tenderness noted.     Comments: SLR positive on Right  Skin:    General: Skin is warm and dry.     Findings: No rash.  Neurological:     Mental Status: She is alert and oriented to person, place, and time.  Psychiatric:        Behavior: Behavior normal.        Thought Content: Thought content normal.     Wt Readings from Last 3 Encounters:  02/21/19 (!) 357 lb (161.9 kg)  01/09/19 (!) 358 lb (162.4 kg)  10/25/18 (!) 355 lb (161 kg)    BP 130/78   Pulse 94   Ht 5\' 7"  (1.702 m)   Wt (!) 357 lb (161.9 kg)   SpO2 96%   BMI 55.91 kg/m   Assessment and Plan: 1. Acute pain of right knee Recommend [facility] walk in  2. B12 nutritional deficiency - cyanocobalamin ((VITAMIN B-12)) injection 1,000 mcg   Partially dictated using Editor, commissioning. Any errors are unintentional.  Halina Maidens, MD [facility]  02/21/2019

## 2019-03-01 DIAGNOSIS — F329 Major depressive disorder, single episode, unspecified: Secondary | ICD-10-CM | POA: Diagnosis not present

## 2019-03-01 DIAGNOSIS — M5416 Radiculopathy, lumbar region: Secondary | ICD-10-CM | POA: Diagnosis not present

## 2019-03-01 DIAGNOSIS — F1120 Opioid dependence, uncomplicated: Secondary | ICD-10-CM | POA: Diagnosis not present

## 2019-03-01 DIAGNOSIS — G4733 Obstructive sleep apnea (adult) (pediatric): Secondary | ICD-10-CM | POA: Diagnosis not present

## 2019-03-07 ENCOUNTER — Other Ambulatory Visit: Payer: Self-pay

## 2019-03-07 ENCOUNTER — Ambulatory Visit
Admission: RE | Admit: 2019-03-07 | Discharge: 2019-03-07 | Disposition: A | Discharge status: Home / Self Care | Payer: Medicare HMO | Source: Ambulatory Visit | Attending: Internal Medicine | Admitting: Internal Medicine

## 2019-03-07 DIAGNOSIS — Z1231 Encounter for screening mammogram for malignant neoplasm of breast: Secondary | ICD-10-CM | POA: Diagnosis not present

## 2019-03-09 DIAGNOSIS — M25561 Pain in right knee: Secondary | ICD-10-CM | POA: Diagnosis not present

## 2019-03-09 DIAGNOSIS — M25569 Pain in unspecified knee: Secondary | ICD-10-CM | POA: Diagnosis not present

## 2019-03-09 DIAGNOSIS — M7071 Other bursitis of hip, right hip: Secondary | ICD-10-CM | POA: Diagnosis not present

## 2019-03-13 ENCOUNTER — Ambulatory Visit (INDEPENDENT_AMBULATORY_CARE_PROVIDER_SITE_OTHER): Payer: Medicare HMO

## 2019-03-13 ENCOUNTER — Other Ambulatory Visit: Payer: Self-pay

## 2019-03-13 VITALS — BP 126/72 | HR 90 | Temp 98.4°F | Resp 16 | Ht 67.0 in | Wt 353.0 lb

## 2019-03-13 DIAGNOSIS — Z0000 Encounter for general adult medical examination without abnormal findings: Secondary | ICD-10-CM | POA: Diagnosis not present

## 2019-03-13 DIAGNOSIS — M25551 Pain in right hip: Secondary | ICD-10-CM | POA: Diagnosis not present

## 2019-03-13 DIAGNOSIS — M25561 Pain in right knee: Secondary | ICD-10-CM | POA: Diagnosis not present

## 2019-03-13 NOTE — Progress Notes (Addendum)
Subjective:   Kimberly Cowan is a 53 y.o. female who presents for Medicare Annual (Subsequent) preventive examination.  Review of Systems:   Cardiac Risk Factors include: obesity (BMI >30kg/m2);hypertension     Objective:     Vitals: BP 126/72 (BP Location: Left Arm, Patient Position: Sitting, Cuff Size: Large)   Pulse 90   Temp 98.4 F (36.9 C) (Oral)   Resp 16   Ht 5\' 7"  (1.702 m)   Wt (!) 353 lb (160.1 kg)   SpO2 98%   BMI 55.29 kg/m   Body mass index is 55.29 kg/m.  Advanced Directives 03/13/2019 10/10/2018 06/23/2018 05/23/2018 05/04/2018 04/12/2018 03/09/2018  Does Patient Have a Medical Advance Directive? No No No No No No No  Would patient like information on creating a medical advance directive? Yes (MAU/Ambulatory/Procedural Areas - Information given) No - Patient declined No - Patient declined No - Patient declined - - Yes (MAU/Ambulatory/Procedural Areas - Information given)  Pre-existing out of facility DNR order (yellow form or pink MOST form) - - - - - - -    Tobacco Social History   Tobacco Use  Smoking Status Never Smoker  Smokeless Tobacco Never Used  Tobacco Comment   smoking cessation materials not required     Counseling given: Not Answered Comment: smoking cessation materials not required   Clinical Intake:  Pre-visit preparation completed: Yes  Pain : 0-10 Pain Score: 7  Pain Type: Chronic pain Pain Location: Back Pain Orientation: Lower Pain Descriptors / Indicators: Aching, Discomfort, Sore Pain Onset: More than a month ago Pain Frequency: Constant     BMI - recorded: 55.29 Nutritional Status: BMI > 30  Obese Nutritional Risks: None Diabetes: No  How often do you need to have someone help you when you read instructions, pamphlets, or other written materials from your doctor or pharmacy?: 1 - Never  Interpreter Needed?: No  Information entered by :: Clemetine Marker LPN  Past Medical History:  Diagnosis Date  . Allergy  1999   SEASONAL  . Anemia   . Anxiety   . Arthritis    lower spine  . Bilateral lumbar radiculopathy   . Chronic back pain   . Clinical depression 04/04/2012  . Depression   . DVT (deep venous thrombosis) ([place])   . Glaucoma    RIGHT EYE  . Headache(784.0)    migraines  . Ovarian cyst   . Pneumonia    walking pneumonia  . Sleep apnea    no cpcap     last sleep study > 4 yrs   Past Surgical History:  Procedure Laterality Date  . ABDOMINAL HYSTERECTOMY  10/2006  . BACK SURGERY  2014   lumbar fusion  . BACK SURGERY  2009   discectomy  . [place]  . COLONOSCOPY WITH PROPOFOL N/A 04/12/2018   Procedure: COLONOSCOPY WITH PROPOFOL;  Surgeon: Lin Landsman, MD;  Location: [facility] ENDOSCOPY;  Service: Gastroenterology;  Laterality: N/A;  . GASTRIC BYPASS  2005  . KNEE ARTHROSCOPY WITH LATERAL MENISECTOMY Right 05/27/2017   Procedure: KNEE ARTHROSCOPY WITH LATERAL MENISECTOMY;  Surgeon: Hessie Knows, MD;  Location: [facility] ORS;  Service: Orthopedics;  Laterality: Right;  . KNEE ARTHROSCOPY WITH LATERAL RELEASE Right 05/27/2017   Procedure: KNEE ARTHROSCOPY WITH LATERAL RELEASE;  Surgeon: Hessie Knows, MD;  Location: [facility] ORS;  Service: Orthopedics;  Laterality: Right;  . LAPAROSCOPIC SALPINGO OOPHERECTOMY Bilateral 10/05/2016   Procedure: LAPAROSCOPIC SALPINGO OOPHORECTOMY;  Surgeon: Brayton Mars, MD;  Location: [facility] ORS;  Service: Gynecology;  Laterality: Bilateral;  . REDUCTION MAMMAPLASTY Bilateral    2001  . SPINAL CORD STIMULATOR INSERTION N/A 03/20/2016   Procedure: LUMBAR SPINAL CORD STIMULATOR INSERTION;  Surgeon: Eustace Moore, MD;  Location: [place] [facility]UR[facility] ORS;  Service: Neurosurgery;  Laterality: N/A;  . SPINE SURGERY  2009 2014 2017  . TONSILLECTOMY     Family History  Problem Relation Age of Onset  . Diabetes Mother   . Hypertension Mother   . Obesity Mother   . Diabetes Sister   . Hypertension Sister   . Hypertension Father   . Obesity Daughter    . Obesity Sister   . Obesity Sister   . Breast cancer Neg Hx    Social History   Socioeconomic History  . Marital status: Widowed    Spouse name: Not on file  . Number of children: 2  . Years of education: some college  . Highest education level: 12th grade  Occupational History  . Occupation: disabled  Social Needs  . Financial resource strain: Somewhat hard  . Food insecurity    Worry: Never true    Inability: Never true  . Transportation needs    Medical: No    Non-medical: No  Tobacco Use  . Smoking status: Never Smoker  . Smokeless tobacco: Never Used  . Tobacco comment: smoking cessation materials not required  Substance and Sexual Activity  . Alcohol use: No    Alcohol/week: 0.0 standard drinks  . Drug use: No  . Sexual activity: Not Currently    Birth control/protection: Surgical  Lifestyle  . Physical activity    Days per week: 0 days    Minutes per session: 0 min  . Stress: Not at all  Relationships  . Social connections    Talks on phone: More than three times a week    Gets together: Three times a week    Attends religious service: More than 4 times per year    Active member of club or organization: No    Attends meetings of clubs or organizations: Never    Relationship status: Widowed  Other Topics Concern  . Not on file  Social History Narrative  . Not on file    Outpatient Encounter Medications as of 03/13/2019  Medication Sig  . cyanocobalamin (,VITAMIN B-12,) 1000 MCG/ML injection cyanocobalamin (vit B-12) 1,000 mcg/mL injection solution  INJECT 1 ML INTO THE MUSCLE Q 30 DAYS  . cyclobenzaprine (FLEXERIL) 10 MG tablet Take 1 tablet by mouth 3 (three) times daily. Pt takes one tablet qhs  . fluticasone (FLONASE) 50 MCG/ACT nasal spray SHAKE LIQUID AND USE 2 SPRAYS IN EACH NOSTRIL DAILY  . furosemide (LASIX) 20 MG tablet TAKE 1 TABLET(20 MG) BY MOUTH TWICE DAILY  . gabapentin (NEURONTIN) 300 MG capsule Take 600 mg by mouth at bedtime.   Marland Kitchen  ketoconazole (NIZORAL) 2 % shampoo Apply 1 application topically daily as needed (for skin fungus on neck/back).  . latanoprost (XALATAN) 0.005 % ophthalmic solution Place 1 drop into both eyes at bedtime.   Marland Kitchen levocetirizine (XYZAL) 5 MG tablet TAKE 1 TABLET(5 MG) BY MOUTH EVERY EVENING  . meloxicam (MOBIC) 15 MG tablet   . oxyCODONE-acetaminophen (PERCOCET/ROXICET) 5-325 MG tablet Take 1 tablet by mouth daily.  . potassium chloride (MICRO-K) 10 MEQ CR capsule TAKE 1 CAPSULE(10 MEQ) BY MOUTH TWICE DAILY  . rizatriptan (MAXALT-MLT) 10 MG disintegrating tablet DISSOLVE ONE TABLET BY MOUTH AS NEEDED FOR MIGRAINE. MAY REPEAT IN  2 HOURS IF NEEDED  . triamterene-hydrochlorothiazide (MAXZIDE) 75-50 MG tablet TAKE 1 TABLET BY MOUTH DAILY  . ferrous sulfate 325 (65 FE) MG EC tablet Take 325 mg by mouth 3 (three) times daily with meals.  . [DISCONTINUED] BD INTEGRA SYRINGE 25G X 1" 3 ML MISC USE AS DIRECTED  . [DISCONTINUED] DULoxetine (CYMBALTA) 30 MG capsule Take 1 capsule by mouth daily.  . [DISCONTINUED] naproxen (NAPROSYN) 500 MG tablet Take 1 tablet (500 mg total) by mouth 2 (two) times daily with a meal.   No facility-administered encounter medications on file as of 03/13/2019.     Activities of Daily Living In your present state of health, do you have any difficulty performing the following activities: 03/13/2019  Hearing? N  Comment declines hearing aids  Vision? N  Comment wears glasses  Difficulty concentrating or making decisions? N  Walking or climbing stairs? N  Dressing or bathing? N  Doing errands, shopping? N  Preparing Food and eating ? N  Using the Toilet? N  In the past six months, have you accidently leaked urine? N  Do you have problems with loss of bowel control? N  Managing your Medications? N  Managing your Finances? N  Housekeeping or managing your Housekeeping? N  Some recent data might be hidden    Patient Care Team: Glean Hess, MD as PCP - General  (Internal Medicine) Earnie Larsson, MD as Consulting Physician (Neurosurgery) Marlaine Hind, MD as Consulting Physician (Physical Medicine and Rehabilitation) Eustace Moore, MD as Consulting Physician (Neurosurgery)    Assessment:   This is a routine wellness examination for Kimberly Cowan.  Exercise Activities and Dietary recommendations Current Exercise Habits: The patient does not participate in regular exercise at present, Exercise limited by: orthopedic condition(s)  Goals    . DIET - INCREASE WATER INTAKE     Recommend to drink at least 6-8 8oz glasses of water per day.    . Exercise 150 min/wk Moderate Activity     Recommend to exercise for at least 150 minutes per week.       Fall Risk Fall Risk  03/13/2019 02/21/2019 01/09/2019 06/23/2018 03/09/2018  Falls in the past year? 1 1 1  Yes Yes  Number falls in past yr: 1 1 0 1 2 or more  Injury with Fall? 1 1 0 (No Data) Yes  Comment - right knee pain- fell 2 weeks ago.  - bruising -  Risk Factor Category  - - - - High Fall Risk  Risk for fall due to : History of fall(s);Impaired mobility History of fall(s);Impaired balance/gait;Impaired mobility History of fall(s);Impaired balance/gait History of fall(s) Other (Comment);Impaired vision;Medication side effect;Impaired balance/gait  Risk for fall due to: Comment - - - - arthritis; wears eyeglasses  Follow up Falls prevention discussed Falls evaluation completed Falls evaluation completed Falls prevention discussed Falls evaluation completed;Education provided;Falls prevention discussed   FALL RISK PREVENTION PERTAINING TO THE HOME:  Any stairs in or around the home? Yes  If so, do they handrails? Yes   Home free of loose throw rugs in walkways, pet beds, electrical cords, etc? Yes  Adequate lighting in your home to reduce risk of falls? Yes   ASSISTIVE DEVICES UTILIZED TO PREVENT FALLS:  Life alert? No  Use of a cane, walker or w/c? Yes  Grab bars in the bathroom? No  Shower chair  or bench in shower? No  Elevated toilet seat or a handicapped toilet? No   DME ORDERS:  DME order needed?  No   TIMED UP AND GO:  Was the test performed? Yes .  Length of time to ambulate 10 feet: 6 sec.   GAIT:  Appearance of gait: Gait stead-fast and without the use of an assistive device.   Education: Fall risk prevention has been discussed.  Intervention(s) required? No   Depression Screen PHQ 2/9 Scores 03/13/2019 02/21/2019 01/09/2019 08/15/2018  PHQ - 2 Score 0 0 0 0  PHQ- 9 Score - - - 0  Exception Documentation - - - -     Cognitive Function     6CIT Screen 03/13/2019 03/09/2018  What Year? 0 points 0 points  What month? 0 points 0 points  What time? 0 points 0 points  Count back from 20 0 points 0 points  Months in reverse 0 points 0 points  Repeat phrase 0 points 0 points  Total Score 0 0    Immunization History  Administered Date(s) Administered  . Influenza,inj,Quad PF,6+ Mos 08/14/2016, 11/15/2017, 08/15/2018  . Tdap 04/24/2014  . Zoster Recombinat (Shingrix) 01/09/2019    Qualifies for Shingles Vaccine? Yes  1st Shingrix vaccine done due for second dose. .   Tdap: Up to date  Flu Vaccine: Up to date  Pneumococcal Vaccine: due at age 73  Screening Tests Health Maintenance  Topic Date Due  . HIV Screening  09/30/2023 (Originally 03/25/1981)  . INFLUENZA VACCINE  04/29/2019  . MAMMOGRAM  03/06/2020  . TETANUS/TDAP  04/24/2024  . COLONOSCOPY  04/12/2028   Cancer Screenings:  Colorectal Screening: Completed 04/12/18. Repeat every 10 years  Mammogram: Completed 03/07/19. Repeat every year.   Bone Density: due age 78  Lung Cancer Screening: (Low Dose CT Chest recommended if Age 27-80 years, 30 pack-year currently smoking OR have quit w/in 15years.) does not qualify.    Additional Screening:  Hepatitis C Screening: does not qualify;  Vision Screening: Recommended annual ophthalmology exams for early detection of glaucoma and other disorders  of the eye. Is the patient up to date with their annual eye exam?  Yes  Who is the provider or what is the name of the office in which the pt attends annual eye exams? Dr. Gloriann Loan   Dental Screening: Recommended annual dental exams for proper oral hygiene  Community Resource Referral:  CRR required this visit?  No      Plan:    I have personally reviewed and addressed the Medicare Annual Wellness questionnaire and have noted the following in the patient's chart:  A. Medical and social history B. Use of alcohol, tobacco or illicit drugs  C. Current medications and supplements D. Functional ability and status E.  Nutritional status F.  Physical activity G. Advance directives H. List of other physicians I.  Hospitalizations, surgeries, and ER visits in previous 12 months J.  [place] such as hearing and vision if needed, cognitive and depression L. Referrals and appointments   In addition, I have reviewed and discussed with patient certain preventive protocols, quality metrics, and best practice recommendations. A written personalized care plan for preventive services as well as general preventive health recommendations were provided to patient.   Signed,  Clemetine Marker, LPN Nurse Health Advisor   Nurse Notes: pt doing well and appreciative of visit today. She plans to start physical therapy today for right knee and hip pain. [facility] at [facility] week and given meloxicam. She is interested in meeting with a bariatric surgeon due to putting weight back on from gatric bypass in 2005.  She has contacted [facility] several times and she completed a seminar in [place], now awaiting records from [facility] and to hear from Psychologist, sport and exercise.

## 2019-03-13 NOTE — Patient Instructions (Signed)
Kimberly Cowan , °Thank you for taking time to come for your Medicare Wellness Visit. I appreciate your ongoing commitment to your health goals. Please review the following plan we discussed and let me know if I can assist you in the future.  ° °Screening recommendations/referrals: °Colonoscopy: done 04/12/18. Repeat in 2029 °Mammogram: done 03/07/19 ° °Recommended yearly ophthalmology/optometry visit for glaucoma screening and checkup °Recommended yearly dental visit for hygiene and checkup ° °Vaccinations: °Influenza vaccine: done 08/15/18 °Pneumococcal vaccine: due age 65 °Tdap vaccine: done 04/24/14 °Shingles vaccine: done 01/09/19 due for second dose   ° °Advanced directives: Advance directive discussed with you today. I have provided a copy for you to complete at home and have notarized. Once this is complete please bring a copy in to our office so we can scan it into your chart. °  °Conditions/risks identified: recommend increasing physical activity to 150 minutes per week ° °Next appointment: Please follow up in one year for your Medicare Annual Wellness visit.  ° °Preventive Care 40-64 Years, Female °Preventive care refers to lifestyle choices and visits with your health care provider that can promote health and wellness. °What does preventive care include? °· A yearly physical exam. This is also called an annual well check. °· Dental exams once or twice a year. °· Routine eye exams. Ask your health care provider how often you should have your eyes checked. °· Personal lifestyle choices, including: °¨ Daily care of your teeth and gums. °¨ Regular physical activity. °¨ Eating a healthy diet. °¨ Avoiding tobacco and drug use. °¨ Limiting alcohol use. °¨ Practicing safe sex. °¨ Taking low-dose aspirin daily starting at age 50. °¨ Taking vitamin and mineral supplements as recommended by your health care provider. °What happens during an annual well check? °The services and screenings done by your health care  provider during your annual well check will depend on your age, overall health, lifestyle risk factors, and family history of disease. °Counseling  °Your health care provider may ask you questions about your: °· Alcohol use. °· Tobacco use. °· Drug use. °· Emotional well-being. °· Home and relationship well-being. °· Sexual activity. °· Eating habits. °· Work and work environment. °· Method of birth control. °· Menstrual cycle. °· Pregnancy history. °Screening  °You may have the following tests or measurements: °· Height, weight, and BMI. °· Blood pressure. °· Lipid and cholesterol levels. These may be checked every 5 years, or more frequently if you are over 50 years old. °· Skin check. °· Lung cancer screening. You may have this screening every year starting at age 55 if you have a 30-pack-year history of smoking and currently smoke or have quit within the past 15 years. °· Fecal occult blood test (FOBT) of the stool. You may have this test every year starting at age 50. °· Flexible sigmoidoscopy or colonoscopy. You may have a sigmoidoscopy every 5 years or a colonoscopy every 10 years starting at age 50. °· Hepatitis C blood test. °· Hepatitis B blood test. °· Sexually transmitted disease (STD) testing. °· Diabetes screening. This is done by checking your blood sugar (glucose) after you have not eaten for a while (fasting). You may have this done every 1-3 years. °· Mammogram. This may be done every 1-2 years. Talk to your health care provider about when you should start having regular mammograms. This may depend on whether you have a family history of breast cancer. °· BRCA-related cancer screening. This may be done if you have a family   history of breast, ovarian, tubal, or peritoneal cancers.  Pelvic exam and Pap test. This may be done every 3 years starting at age 64. Starting at age 39, this may be done every 5 years if you have a Pap test in combination with an HPV test.  Bone density scan. This is done  to screen for osteoporosis. You may have this scan if you are at high risk for osteoporosis. Discuss your test results, treatment options, and if necessary, the need for more tests with your health care provider. Vaccines  Your health care provider may recommend certain vaccines, such as:  Influenza vaccine. This is recommended every year.  Tetanus, diphtheria, and acellular pertussis (Tdap, Td) vaccine. You may need a Td booster every 10 years.  Zoster vaccine. You may need this after age 56.  Pneumococcal 13-valent conjugate (PCV13) vaccine. You may need this if you have certain conditions and were not previously vaccinated.  Pneumococcal polysaccharide (PPSV23) vaccine. You may need one or two doses if you smoke cigarettes or if you have certain conditions. Talk to your health care provider about which screenings and vaccines you need and how often you need them. This information is not intended to replace advice given to you by your health care provider. Make sure you discuss any questions you have with your health care provider. Document Released: 10/11/2015 Document Revised: 06/03/2016 Document Reviewed: 07/16/2015 Elsevier Interactive Patient Education  2017 [place] Prevention in the Home Falls can cause injuries. They can happen to people of all ages. There are many things you can do to make your home safe and to help prevent falls. What can I do on the outside of my home?  Regularly fix the edges of walkways and driveways and fix any cracks.  Remove anything that might make you trip as you walk through a door, such as a raised step or threshold.  Trim any bushes or trees on the path to your home.  Use bright outdoor lighting.  Clear any walking paths of anything that might make someone trip, such as rocks or tools.  Regularly check to see if handrails are loose or broken. Make sure that both sides of any steps have handrails.  Any raised decks and porches  should have guardrails on the edges.  Have any leaves, snow, or ice cleared regularly.  Use sand or salt on walking paths during winter.  Clean up any spills in your garage right away. This includes oil or grease spills. What can I do in the bathroom?  Use night lights.  Install grab bars by the toilet and in the tub and shower. Do not use towel bars as grab bars.  Use non-skid mats or decals in the tub or shower.  If you need to sit down in the shower, use a plastic, non-slip stool.  Keep the floor dry. Clean up any water that spills on the floor as soon as it happens.  Remove soap buildup in the tub or shower regularly.  Attach bath mats securely with double-sided non-slip rug tape.  Do not have throw rugs and other things on the floor that can make you trip. What can I do in the bedroom?  Use night lights.  Make sure that you have a light by your bed that is easy to reach.  Do not use any sheets or blankets that are too big for your bed. They should not hang down onto the floor.  Have a firm chair  that has side arms. You can use this for support while you get dressed. °· Do not have throw rugs and other things on the floor that can make you trip. °What can I do in the kitchen? °· Clean up any spills right away. °· Avoid walking on wet floors. °· Keep items that you use a lot in easy-to-reach places. °· If you need to reach something above you, use a strong step stool that has a grab bar. °· Keep electrical cords out of the way. °· Do not use floor polish or wax that makes floors slippery. If you must use wax, use non-skid floor wax. °· Do not have throw rugs and other things on the floor that can make you trip. °What can I do with my stairs? °· Do not leave any items on the stairs. °· Make sure that there are handrails on both sides of the stairs and use them. Fix handrails that are broken or loose. Make sure that handrails are as long as the stairways. °· Check any carpeting to  make sure that it is firmly attached to the stairs. Fix any carpet that is loose or worn. °· Avoid having throw rugs at the top or bottom of the stairs. If you do have throw rugs, attach them to the floor with carpet tape. °· Make sure that you have a light switch at the top of the stairs and the bottom of the stairs. If you do not have them, ask someone to add them for you. °What else can I do to help prevent falls? °· Wear shoes that: °¨ Do not have high heels. °¨ Have rubber bottoms. °¨ Are comfortable and fit you well. °¨ Are closed at the toe. Do not wear sandals. °· If you use a stepladder: °¨ Make sure that it is fully opened. Do not climb a closed stepladder. °¨ Make sure that both sides of the stepladder are locked into place. °¨ Ask someone to hold it for you, if possible. °· Clearly mark and make sure that you can see: °¨ Any grab bars or handrails. °¨ First and last steps. °¨ Where the edge of each step is. °· Use tools that help you move around (mobility aids) if they are needed. These include: °¨ Canes. °¨ Walkers. °¨ Scooters. °¨ Crutches. °· Turn on the lights when you go into a dark area. Replace any light bulbs as soon as they burn out. °· Set up your furniture so you have a clear path. Avoid moving your furniture around. °· If any of your floors are uneven, fix them. °· If there are any pets around you, be aware of where they are. °· Review your medicines with your doctor. Some medicines can make you feel dizzy. This can increase your chance of falling. °Ask your doctor what other things that you can do to help prevent falls. °This information is not intended to replace advice given to you by your health care provider. Make sure you discuss any questions you have with your health care provider. °Document Released: 07/11/2009 Document Revised: 02/20/2016 Document Reviewed: 10/19/2014 °Elsevier Interactive Patient Education © 2017 [employer] ° °

## 2019-03-20 DIAGNOSIS — M25551 Pain in right hip: Secondary | ICD-10-CM | POA: Diagnosis not present

## 2019-03-20 DIAGNOSIS — M25561 Pain in right knee: Secondary | ICD-10-CM | POA: Diagnosis not present

## 2019-03-20 DIAGNOSIS — G4733 Obstructive sleep apnea (adult) (pediatric): Secondary | ICD-10-CM | POA: Diagnosis not present

## 2019-03-24 ENCOUNTER — Ambulatory Visit (INDEPENDENT_AMBULATORY_CARE_PROVIDER_SITE_OTHER): Payer: Medicare HMO

## 2019-03-24 ENCOUNTER — Other Ambulatory Visit: Payer: Self-pay

## 2019-03-24 DIAGNOSIS — E538 Deficiency of other specified B group vitamins: Secondary | ICD-10-CM

## 2019-03-24 MED ORDER — CYANOCOBALAMIN 1000 MCG/ML IJ SOLN
1000.0000 ug | Freq: Once | INTRAMUSCULAR | Status: AC
  Administered 2019-03-24: 1000 ug via INTRAMUSCULAR

## 2019-03-30 DIAGNOSIS — M25561 Pain in right knee: Secondary | ICD-10-CM | POA: Diagnosis not present

## 2019-03-30 DIAGNOSIS — M25551 Pain in right hip: Secondary | ICD-10-CM | POA: Diagnosis not present

## 2019-03-31 ENCOUNTER — Other Ambulatory Visit: Payer: Self-pay | Admitting: Internal Medicine

## 2019-03-31 DIAGNOSIS — R600 Localized edema: Secondary | ICD-10-CM

## 2019-04-21 ENCOUNTER — Other Ambulatory Visit: Payer: Self-pay

## 2019-04-21 ENCOUNTER — Ambulatory Visit: Payer: Medicare HMO

## 2019-04-21 MED ORDER — CYANOCOBALAMIN 1000 MCG/ML IJ SOLN: 1000.0000 ug | Freq: Once | INTRAMUSCULAR | Status: DC

## 2019-04-24 ENCOUNTER — Ambulatory Visit: Payer: Medicare HMO

## 2019-05-02 ENCOUNTER — Other Ambulatory Visit: Payer: Self-pay | Admitting: Internal Medicine

## 2019-05-02 DIAGNOSIS — J0100 Acute maxillary sinusitis, unspecified: Secondary | ICD-10-CM

## 2019-05-09 ENCOUNTER — Other Ambulatory Visit: Payer: Self-pay

## 2019-05-09 ENCOUNTER — Ambulatory Visit (INDEPENDENT_AMBULATORY_CARE_PROVIDER_SITE_OTHER): Payer: Medicare HMO

## 2019-05-09 DIAGNOSIS — E538 Deficiency of other specified B group vitamins: Secondary | ICD-10-CM

## 2019-05-09 MED ORDER — CYANOCOBALAMIN 1000 MCG/ML IJ SOLN
1000.0000 ug | Freq: Once | INTRAMUSCULAR | Status: AC
  Administered 2019-05-09: 1000 ug via INTRAMUSCULAR

## 2019-05-30 DIAGNOSIS — F419 Anxiety disorder, unspecified: Secondary | ICD-10-CM | POA: Diagnosis not present

## 2019-05-30 DIAGNOSIS — Z79899 Other long term (current) drug therapy: Secondary | ICD-10-CM | POA: Diagnosis not present

## 2019-05-30 DIAGNOSIS — M961 Postlaminectomy syndrome, not elsewhere classified: Secondary | ICD-10-CM | POA: Diagnosis not present

## 2019-05-30 DIAGNOSIS — M48061 Spinal stenosis, lumbar region without neurogenic claudication: Secondary | ICD-10-CM | POA: Diagnosis not present

## 2019-06-03 IMAGING — DX DG FOOT COMPLETE 3+V*R*
3 series · 3 of 3 positions shown · non-contrast
Comparison: None in PACs

CLINICAL DATA: Awakened with right ankle pain and dorsal foot pain.
No known injury.

EXAM:
RIGHT FOOT COMPLETE - 3+ VIEW

[foot ap]
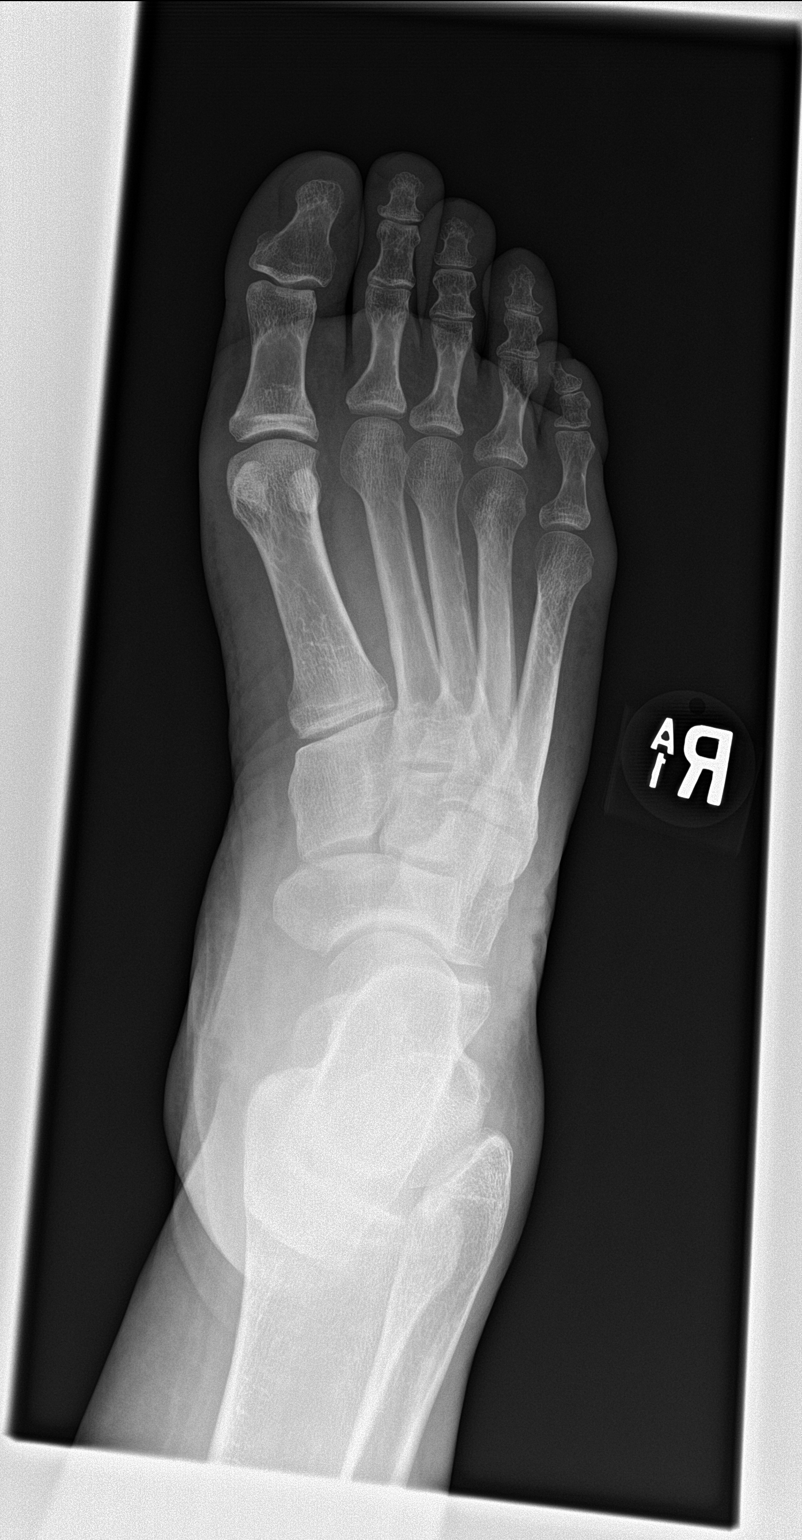

[foot obl]
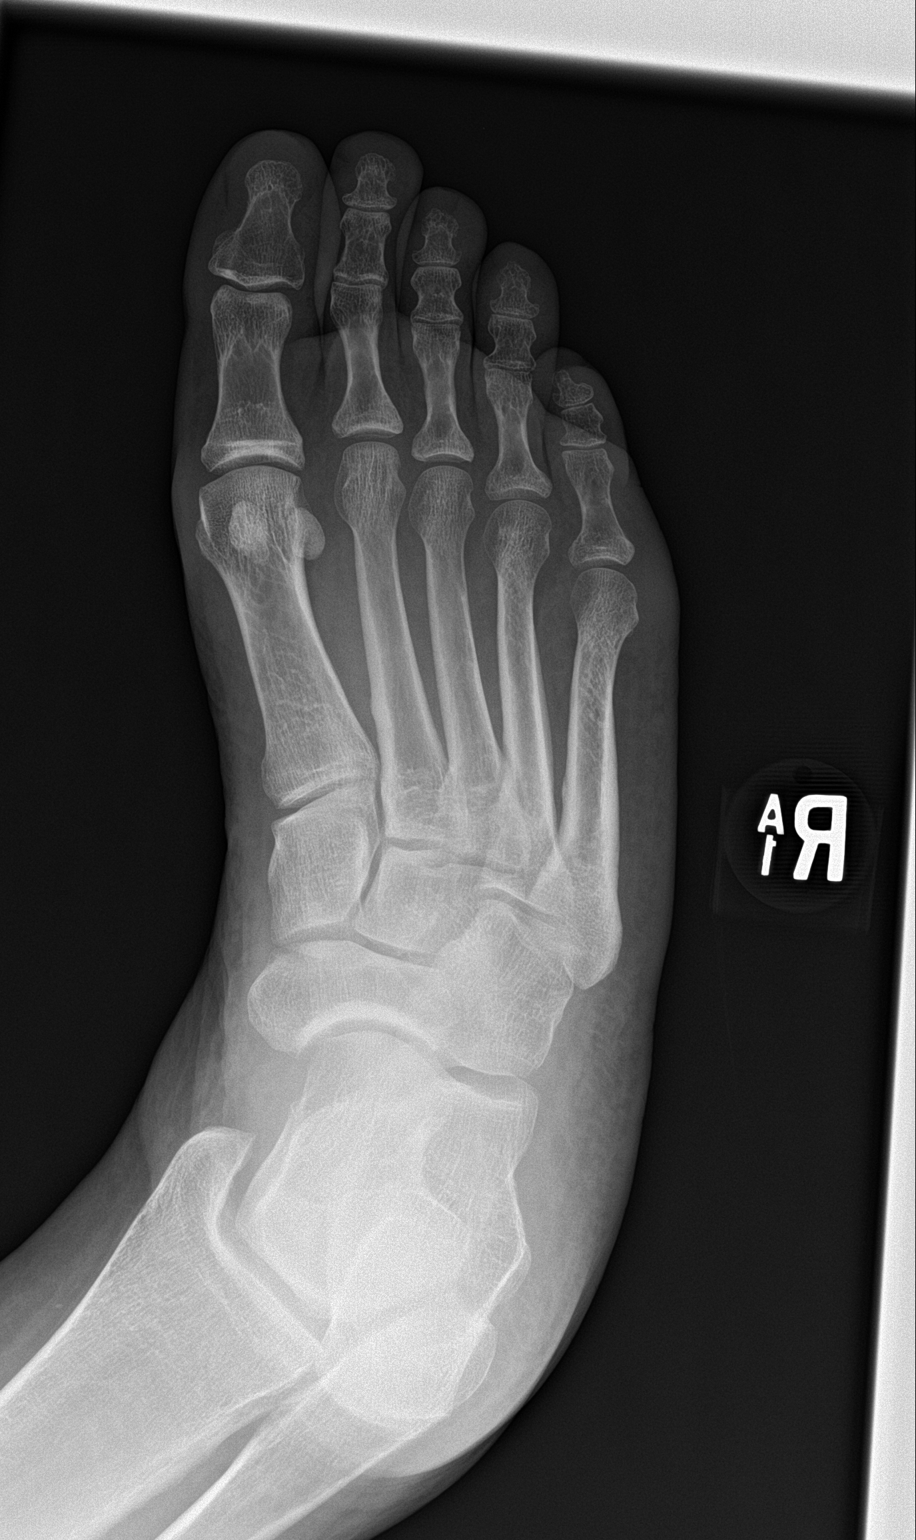

[foot lat]
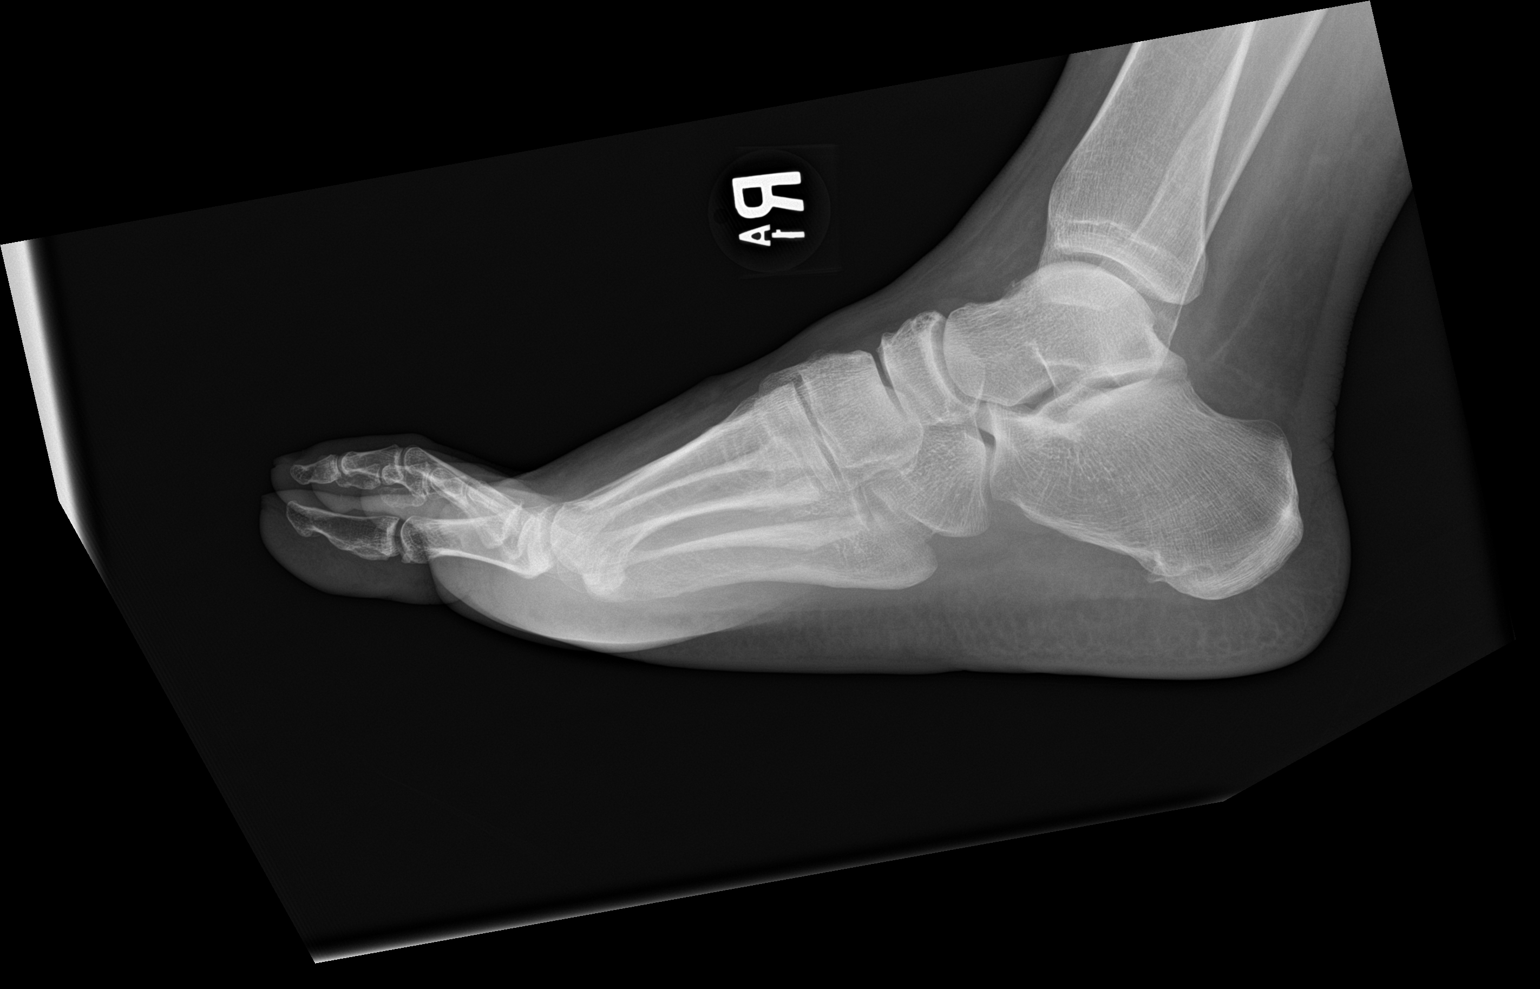

[3 of 3 positions shown; findings below may reference images not displayed]

FINDINGS: The bones are subjectively adequately mineralized. There is no acute
fracture nor dislocation. The joint spaces are well maintained.
There is a tiny plantar calcaneal spur. The soft tissues are
unremarkable.
IMPRESSION: There is no acute or significant chronic bony abnormality of the
right foot.

## 2019-06-12 ENCOUNTER — Other Ambulatory Visit: Payer: Self-pay

## 2019-06-12 ENCOUNTER — Ambulatory Visit: Payer: Medicare HMO

## 2019-06-12 ENCOUNTER — Ambulatory Visit (INDEPENDENT_AMBULATORY_CARE_PROVIDER_SITE_OTHER): Payer: Medicare HMO

## 2019-06-12 DIAGNOSIS — E538 Deficiency of other specified B group vitamins: Secondary | ICD-10-CM | POA: Diagnosis not present

## 2019-06-12 DIAGNOSIS — J3089 Other allergic rhinitis: Secondary | ICD-10-CM

## 2019-06-12 DIAGNOSIS — J0100 Acute maxillary sinusitis, unspecified: Secondary | ICD-10-CM

## 2019-06-12 DIAGNOSIS — R600 Localized edema: Secondary | ICD-10-CM

## 2019-06-12 MED ORDER — FLUTICASONE PROPIONATE 50 MCG/ACT NA SUSP: NASAL | 0 refills | Status: DC

## 2019-06-12 MED ORDER — POTASSIUM CHLORIDE ER 10 MEQ PO CPCR: ORAL_CAPSULE | ORAL | 0 refills | Status: DC

## 2019-06-12 MED ORDER — FUROSEMIDE 20 MG PO TABS: ORAL_TABLET | ORAL | 0 refills | Status: DC

## 2019-06-12 MED ORDER — CYANOCOBALAMIN 1000 MCG/ML IJ SOLN
1000.0000 ug | Freq: Once | INTRAMUSCULAR | Status: AC
  Administered 2019-06-12: 1000 ug via INTRAMUSCULAR

## 2019-06-12 MED ORDER — LEVOCETIRIZINE DIHYDROCHLORIDE 5 MG PO TABS: ORAL_TABLET | ORAL | 0 refills | Status: DC

## 2019-06-12 MED ORDER — TRIAMTERENE-HCTZ 75-50 MG PO TABS: 1.0000 | ORAL_TABLET | Freq: Every day | ORAL | 0 refills | Status: DC

## 2019-06-13 ENCOUNTER — Other Ambulatory Visit: Payer: Self-pay

## 2019-06-13 DIAGNOSIS — R600 Localized edema: Secondary | ICD-10-CM

## 2019-06-13 DIAGNOSIS — J0100 Acute maxillary sinusitis, unspecified: Secondary | ICD-10-CM

## 2019-06-13 MED ORDER — RIZATRIPTAN BENZOATE 10 MG PO TBDP: ORAL_TABLET | ORAL | 0 refills | Status: DC

## 2019-06-16 ENCOUNTER — Other Ambulatory Visit: Payer: Self-pay | Admitting: Internal Medicine

## 2019-06-16 DIAGNOSIS — R600 Localized edema: Secondary | ICD-10-CM

## 2019-06-17 ENCOUNTER — Other Ambulatory Visit: Payer: Self-pay | Admitting: Internal Medicine

## 2019-06-17 DIAGNOSIS — R600 Localized edema: Secondary | ICD-10-CM

## 2019-06-20 DIAGNOSIS — G4733 Obstructive sleep apnea (adult) (pediatric): Secondary | ICD-10-CM | POA: Diagnosis not present

## 2019-06-21 DIAGNOSIS — H40153 Residual stage of open-angle glaucoma, bilateral: Secondary | ICD-10-CM | POA: Diagnosis not present

## 2019-07-07 ENCOUNTER — Ambulatory Visit (INDEPENDENT_AMBULATORY_CARE_PROVIDER_SITE_OTHER): Payer: Medicare HMO | Admitting: Family Medicine

## 2019-07-07 ENCOUNTER — Encounter: Payer: Self-pay | Admitting: Family Medicine

## 2019-07-07 ENCOUNTER — Other Ambulatory Visit: Payer: Self-pay

## 2019-07-07 VITALS — BP 120/70 | HR 72 | Temp 98.6°F | Ht 67.0 in | Wt 351.0 lb

## 2019-07-07 DIAGNOSIS — Z23 Encounter for immunization: Secondary | ICD-10-CM

## 2019-07-07 DIAGNOSIS — H60332 Swimmer's ear, left ear: Secondary | ICD-10-CM | POA: Diagnosis not present

## 2019-07-07 MED ORDER — NEOMYCIN-POLYMYXIN-HC 3.5-10000-1 OT SOLN: 3.0000 [drp] | Freq: Four times a day (QID) | OTIC | 0 refills | Status: DC

## 2019-07-07 MED ORDER — DOXYCYCLINE HYCLATE 100 MG PO TABS: 100.0000 mg | ORAL_TABLET | Freq: Two times a day (BID) | ORAL | 0 refills | Status: DC

## 2019-07-07 NOTE — Progress Notes (Signed)
Date:  07/07/2019   Name:  Kimberly Cowan   DOB:  03-Dec-1965   MRN:  [id number]   Chief Complaint: Ear Pain (woke up at 4 this am with a throbbing pain)  Otalgia  There is pain in the left ear. This is a new problem. The current episode started today (last ear). The problem occurs constantly. The problem has been gradually worsening. There has been no fever. The pain is moderate. Pertinent negatives include no abdominal pain, coughing, diarrhea, ear discharge, headaches, hearing loss, neck pain, rash, rhinorrhea, sore throat or vomiting. The treatment provided moderate relief. Her past medical history is significant for a tympanostomy tube.    Review of Systems  Constitutional: Negative for chills and fever.  HENT: Positive for ear pain. Negative for drooling, ear discharge, hearing loss, rhinorrhea and sore throat.   Respiratory: Negative for cough, shortness of breath and wheezing.   Cardiovascular: Negative for chest pain, palpitations and leg swelling.  Gastrointestinal: Negative for abdominal pain, blood in stool, constipation, diarrhea, nausea and vomiting.  Endocrine: Negative for polydipsia.  Genitourinary: Negative for dysuria, frequency, hematuria and urgency.  Musculoskeletal: Negative for back pain, myalgias and neck pain.  Skin: Negative for rash.  Allergic/Immunologic: Negative for environmental allergies.  Neurological: Negative for dizziness and headaches.  Hematological: Does not bruise/bleed easily.  Psychiatric/Behavioral: Negative for suicidal ideas. The patient is not nervous/anxious.     Patient Active Problem List   Diagnosis Date Noted  . Major depressive disorder with single episode, in full remission ([place]) 08/15/2018  . Arm pain, left 05/17/2018  . Encounter for screening colonoscopy   . Mild peripheral edema 11/15/2017  . Perforated ear drum, left 06/25/2017  . History of pulmonary embolism 06/10/2017  . Environmental and seasonal  allergies 04/07/2017  . Tinea versicolor 02/03/2017  . Vasomotor symptoms due to menopause 11/11/2016  . Morbid obesity ([place]) 09/10/2016  . B12 nutritional deficiency 08/05/2016  . Obstructive sleep apnea of adult 03/04/2016  . Bilateral lumbar radiculopathy 08/14/2015  . Status post gastric bypass for obesity 07/04/2015  . Prediabetes 07/01/2015  . Headache, migraine 04/04/2012    Allergies  Allergen Reactions  . Morphine And Related Hives, Shortness Of Breath, Nausea And Vomiting and Other (See Comments)    "can't breathe"  . Penicillins Anaphylaxis, Hives, Shortness Of Breath and Nausea And Vomiting    "can't breathe", Has patient had a PCN reaction causing immediate rash, facial/tongue/throat swelling, SOB or lightheadedness with hypotension: Yes Has patient had a PCN reaction causing severe rash involving mucus membranes or skin necrosis: No Has patient had a PCN reaction that required hospitalization No Has patient had a PCN reaction occurring within the last 10 years: No If all of the above answers are "NO", then may proceed with Cephalosporin use.   . Latex Hives  . Baclofen Nausea Only    Jittery, anxious    Past Surgical History:  Procedure Laterality Date  . ABDOMINAL HYSTERECTOMY  10/2006  . BACK SURGERY  2014   lumbar fusion  . BACK SURGERY  2009   discectomy  . [place]  . COLONOSCOPY WITH PROPOFOL N/A 04/12/2018   Procedure: COLONOSCOPY WITH PROPOFOL;  Surgeon: Lin Landsman, MD;  Location: [facility] ENDOSCOPY;  Service: Gastroenterology;  Laterality: N/A;  . GASTRIC BYPASS  2005  . KNEE ARTHROSCOPY WITH LATERAL MENISECTOMY Right 05/27/2017   Procedure: KNEE ARTHROSCOPY WITH LATERAL MENISECTOMY;  Surgeon: Hessie Knows, MD;  Location: [facility] ORS;  Service: Orthopedics;  Laterality: Right;  . KNEE ARTHROSCOPY WITH LATERAL RELEASE Right 05/27/2017   Procedure: KNEE ARTHROSCOPY WITH LATERAL RELEASE;  Surgeon: Hessie Knows, MD;  Location: [facility] ORS;   Service: Orthopedics;  Laterality: Right;  . LAPAROSCOPIC SALPINGO OOPHERECTOMY Bilateral 10/05/2016   Procedure: LAPAROSCOPIC SALPINGO OOPHORECTOMY;  Surgeon: Brayton Mars, MD;  Location: [facility] ORS;  Service: Gynecology;  Laterality: Bilateral;  . REDUCTION MAMMAPLASTY Bilateral    2001  . SPINAL CORD STIMULATOR INSERTION N/A 03/20/2016   Procedure: LUMBAR SPINAL CORD STIMULATOR INSERTION;  Surgeon: Eustace Moore, MD;  Location: [place] NEURO ORS;  Service: Neurosurgery;  Laterality: N/A;  . SPINE SURGERY  2009 2014 2017  . TONSILLECTOMY      Social History   Tobacco Use  . Smoking status: Never Smoker  . Smokeless tobacco: Never Used  . Tobacco comment: smoking cessation materials not required  Substance Use Topics  . Alcohol use: No    Alcohol/week: 0.0 standard drinks  . Drug use: No     Medication list has been reviewed and updated.  Current Meds  Medication Sig  . cyanocobalamin (,VITAMIN B-12,) 1000 MCG/ML injection cyanocobalamin (vit B-12) 1,000 mcg/mL injection solution  INJECT 1 ML INTO THE MUSCLE Q 30 DAYS  . cyclobenzaprine (FLEXERIL) 10 MG tablet Take 1 tablet by mouth 3 (three) times daily. Pt takes one tablet qhs  . fluticasone (FLONASE) 50 MCG/ACT nasal spray SHAKE LIQUID AND USE 2 SPRAYS IN EACH NOSTRIL DAILY  . gabapentin (NEURONTIN) 300 MG capsule Take 600 mg by mouth at bedtime.   Marland Kitchen ketoconazole (NIZORAL) 2 % shampoo Apply 1 application topically daily as needed (for skin fungus on neck/back).  . latanoprost (XALATAN) 0.005 % ophthalmic solution Place 1 drop into both eyes at bedtime.   Marland Kitchen levocetirizine (XYZAL) 5 MG tablet TAKE 1 TABLET(5 MG) BY MOUTH EVERY EVENING  . meloxicam (MOBIC) 15 MG tablet   . oxyCODONE-acetaminophen (PERCOCET/ROXICET) 5-325 MG tablet Take 1 tablet by mouth daily.  . potassium chloride (MICRO-K) 10 MEQ CR capsule TAKE 1 CAPSULE (10 MEQ) BY MOUTH TWICE DAILY  . rizatriptan (MAXALT-MLT) 10 MG disintegrating tablet DISSOLVE ONE TABLET  BY MOUTH AS NEEDED FOR MIGRAINE. MAY REPEAT IN 2 HOURS IF NEEDED  . triamterene-hydrochlorothiazide (MAXZIDE) 75-50 MG tablet Take 1 tablet by mouth daily.  . [DISCONTINUED] ferrous sulfate 325 (65 FE) MG EC tablet Take 325 mg by mouth 3 (three) times daily with meals.  . [DISCONTINUED] furosemide (LASIX) 20 MG tablet TAKE 1 TABLET (20 MG) BY MOUTH TWICE DAILY   Current Facility-Administered Medications for the 07/07/19 encounter (Office Visit) with Juline Patch, MD  Medication  . cyanocobalamin ((VITAMIN B-12)) injection 1,000 mcg    PHQ 2/9 Scores 07/07/2019 03/13/2019 02/21/2019 01/09/2019  PHQ - 2 Score 0 0 0 0  PHQ- 9 Score 0 - - -  Exception Documentation - - - -    BP Readings from Last 3 Encounters:  07/07/19 120/70  03/13/19 126/72  02/21/19 130/78    Physical Exam Vitals signs reviewed.  Constitutional:      Appearance: She is well-developed.  HENT:     Head: Normocephalic.     Right Ear: Ear canal and external ear normal. Tympanic membrane is retracted.     Left Ear: Swelling and tenderness present. Tympanic membrane is retracted.     Nose:     Right Sinus: No maxillary sinus tenderness or frontal sinus tenderness.     Left Sinus: Maxillary sinus tenderness present. No frontal sinus  tenderness.  Eyes:     General: Lids are everted, no foreign bodies appreciated. No scleral icterus.       Left eye: No foreign body or hordeolum.     Conjunctiva/sclera: Conjunctivae normal.     Right eye: Right conjunctiva is not injected.     Left eye: Left conjunctiva is not injected.     Pupils: Pupils are equal, round, and reactive to light.  Neck:     Musculoskeletal: Normal range of motion and neck supple.     Thyroid: No thyromegaly.     Vascular: No JVD.     Trachea: No tracheal deviation.  Cardiovascular:     Rate and Rhythm: Normal rate and regular rhythm.     Heart sounds: Normal heart sounds. No murmur. No friction rub. No gallop.   Pulmonary:     Effort: Pulmonary  effort is normal. No respiratory distress.     Breath sounds: Normal breath sounds. No wheezing or rales.  Abdominal:     General: Bowel sounds are normal.     Palpations: Abdomen is soft. There is no mass.     Tenderness: There is no abdominal tenderness. There is no guarding or rebound.  Musculoskeletal: Normal range of motion.        General: No tenderness.  Lymphadenopathy:     Cervical: No cervical adenopathy.  Skin:    General: Skin is warm.     Findings: No rash.  Neurological:     Mental Status: She is alert and oriented to person, place, and time.     Cranial Nerves: No cranial nerve deficit.     Deep Tendon Reflexes: Reflexes normal.  Psychiatric:        Mood and Affect: Mood is not anxious or depressed.     Wt Readings from Last 3 Encounters:  07/07/19 (!) 351 lb (159.2 kg)  03/13/19 (!) 353 lb (160.1 kg)  02/21/19 (!) 357 lb (161.9 kg)    BP 120/70   Pulse 72   Temp 98.6 F (37 C) (Oral)   Ht 5\' 7"  (1.702 m)   Wt (!) 351 lb (159.2 kg)   BMI 54.97 kg/m   Assessment and Plan:  1. Acute swimmer's ear of left side Acute.  Presently painful.  Exam and previous history of ear infections including external otitis was reviewed.  Neomycin otic drops was initiated 3 drops in ear every 3-4 hours while awake and doxycycline 100 mg twice a day for 7 days for swelling in the canal.  Incidental finding was some tenderness over the left maxillary sinus in case there is an infection this should cover this as well  2. Influenza vaccine needed Discussed and initiated - Flu Vaccine QUAD 6+ mos PF IM (Fluarix Quad PF)

## 2019-07-11 DIAGNOSIS — M542 Cervicalgia: Secondary | ICD-10-CM | POA: Diagnosis not present

## 2019-07-13 ENCOUNTER — Ambulatory Visit: Payer: Medicare HMO | Admitting: Internal Medicine

## 2019-07-13 DIAGNOSIS — Z20828 Contact with and (suspected) exposure to other viral communicable diseases: Secondary | ICD-10-CM | POA: Diagnosis not present

## 2019-07-17 ENCOUNTER — Other Ambulatory Visit: Payer: Self-pay | Admitting: Internal Medicine

## 2019-07-17 ENCOUNTER — Other Ambulatory Visit: Payer: Self-pay

## 2019-07-17 ENCOUNTER — Telehealth: Payer: Medicare HMO | Admitting: Family

## 2019-07-17 DIAGNOSIS — J0100 Acute maxillary sinusitis, unspecified: Secondary | ICD-10-CM

## 2019-07-17 DIAGNOSIS — R600 Localized edema: Secondary | ICD-10-CM

## 2019-07-17 DIAGNOSIS — Z20822 Contact with and (suspected) exposure to covid-19: Secondary | ICD-10-CM

## 2019-07-17 DIAGNOSIS — R509 Fever, unspecified: Secondary | ICD-10-CM

## 2019-07-17 NOTE — Progress Notes (Signed)
E-Visit for Corona Virus Screening   Your current symptoms could be consistent with the coronavirus.  Many health care providers can now test patients at their office but not all are.  [place] has multiple testing sites. For information on our COVID testing locations and hours go to [email or web address]  Please quarantine yourself while awaiting your test results.  We are enrolling you in our [place] for [place] . Daily you will receive a questionnaire within the [place] website. Our COVID 19 response team willl be monitoriing your responses daily.  You can go to one of the  testing sites listed below, while they are opened (see hours). You do not need an order and will stay in your car during the test. You do need to self isolate until your results return and if positive 14 days from when your symptoms started and until you are 3 days symptom free.   Testing Locations (Monday - Friday, 8 a.m. - 3:30 p.m.) . [place]: [facility] at [facility], [address] [place], [place]: [place], [place], [place], [place] (entrance off [address])  . [facility]: (Closed each Monday): Testing site relocated to the short stay covered drive at [facility]. (Use the [facility] entrance to [facility] next to [place] is a respiratory illness with symptoms that are similar to the flu. Symptoms are typically mild to moderate, but there have been cases of severe illness and death due to the virus. The following symptoms may appear 2-14 days after exposure: . Fever . Cough . Shortness of breath or difficulty breathing . Chills . Repeated shaking with chills . Muscle pain . Headache . Sore throat . New loss of taste or smell . Fatigue . Congestion or runny nose . Nausea or vomiting . Diarrhea  It is vitally important that if you feel that you  have an infection such as this virus or any other virus that you stay home and away from places where you may spread it to others.  You should self-quarantine for 14 days if you have symptoms that could potentially be coronavirus or have been in close contact a with a person diagnosed with COVID-19 within the last 2 weeks. You should avoid contact with people age 67 and older.   You should wear a mask or cloth face covering over your nose and mouth if you must be around other people or animals, including pets (even at home). Try to stay at least 6 feet away from other people. This will protect the people around you.   You may also take acetaminophen (Tylenol) as needed for fever.   Reduce your risk of any infection by using the same precautions used for avoiding the common cold or flu:  Marland Kitchen Wash your hands often with soap and warm water for at least 20 seconds.  If soap and water are not readily available, use an alcohol-based hand sanitizer with at least 60% alcohol.  . If coughing or sneezing, cover your mouth and nose by coughing or sneezing into the elbow areas of your shirt or coat, into a tissue or into your sleeve (not your hands). . Avoid shaking hands with others and consider head nods or verbal greetings only. . Avoid touching your eyes, nose, or mouth with unwashed hands.  . Avoid close contact with people who are sick. . Avoid places or events with large numbers of  people in one location, like concerts or sporting events. . Carefully consider travel plans you have or are making. . If you are planning any travel outside or inside the [place], visit the CDC's Travelers' Health webpage for the latest health notices. . If you have some symptoms but not all symptoms, continue to monitor at home and seek medical attention if your symptoms worsen. . If you are having a medical emergency, call 911.  HOME CARE . Only take medications as instructed by your medical team. . Drink plenty of fluids and  get plenty of rest. . A steam or ultrasonic humidifier can help if you have congestion.   GET HELP RIGHT AWAY IF YOU HAVE EMERGENCY WARNING SIGNS** FOR COVID-19. If you or someone is showing any of these signs seek emergency medical care immediately. Call 911 or proceed to your closest emergency facility if: . You develop worsening high fever. . Trouble breathing . Bluish lips or face . Persistent pain or pressure in the chest . New confusion . Inability to wake or stay awake . You cough up blood. . Your symptoms become more severe  **This list is not all possible symptoms. Contact your medical provider for any symptoms that are sever or concerning to you.   MAKE SURE YOU   Understand these instructions.  Will watch your condition.  Will get help right away if you are not doing well or get worse.  Your e-visit answers were reviewed by a board certified advanced clinical practitioner to complete your personal care plan.  Depending on the condition, your plan could have included both over the counter or prescription medications.  If there is a problem please reply once you have received a response from your provider.  Your safety is important to [place].  If you have drug allergies check your prescription carefully.    You can use MyChart to ask questions about today's visit, request a non-urgent call back, or ask for a work or school excuse for 24 hours related to this e-Visit. If it has been greater than 24 hours you will need to follow up with your provider, or enter a new e-Visit to address those concerns. You will get an e-mail in the next two days asking about your experience.  I hope that your e-visit has been valuable and will speed your recovery. Thank you for using e-visits.   Approximately 5 minutes was spent documenting and reviewing patient's chart.

## 2019-07-19 ENCOUNTER — Other Ambulatory Visit: Payer: Self-pay | Admitting: *Deleted

## 2019-07-19 DIAGNOSIS — Z20822 Contact with and (suspected) exposure to covid-19: Secondary | ICD-10-CM

## 2019-07-19 LAB — NOVEL CORONAVIRUS, NAA: SARS-CoV-2, NAA: NOT DETECTED

## 2019-07-20 LAB — NOVEL CORONAVIRUS, NAA: SARS-CoV-2, NAA: NOT DETECTED

## 2019-08-08 ENCOUNTER — Other Ambulatory Visit: Payer: Self-pay

## 2019-08-08 ENCOUNTER — Ambulatory Visit (INDEPENDENT_AMBULATORY_CARE_PROVIDER_SITE_OTHER): Payer: Medicare HMO

## 2019-08-08 DIAGNOSIS — E538 Deficiency of other specified B group vitamins: Secondary | ICD-10-CM

## 2019-08-08 MED ORDER — CYANOCOBALAMIN 1000 MCG/ML IJ SOLN
1000.0000 ug | Freq: Once | INTRAMUSCULAR | Status: AC
  Administered 2019-08-08: 1000 ug via INTRAMUSCULAR

## 2019-08-28 DIAGNOSIS — M542 Cervicalgia: Secondary | ICD-10-CM | POA: Diagnosis not present

## 2019-08-28 DIAGNOSIS — M47816 Spondylosis without myelopathy or radiculopathy, lumbar region: Secondary | ICD-10-CM | POA: Diagnosis not present

## 2019-08-28 DIAGNOSIS — Z6841 Body Mass Index (BMI) 40.0 and over, adult: Secondary | ICD-10-CM | POA: Diagnosis not present

## 2019-08-28 DIAGNOSIS — F329 Major depressive disorder, single episode, unspecified: Secondary | ICD-10-CM | POA: Diagnosis not present

## 2019-09-19 ENCOUNTER — Other Ambulatory Visit: Payer: Self-pay | Admitting: Internal Medicine

## 2019-09-27 DIAGNOSIS — G4733 Obstructive sleep apnea (adult) (pediatric): Secondary | ICD-10-CM | POA: Diagnosis not present

## 2019-10-01 IMAGING — XA DG MYELOGRAPHY LUMBAR INJ LUMBOSACRAL
13 of 18 series · 13 of 18 positions shown · non-contrast
Comparison: None

CLINICAL DATA: Frequent falls. Left leg gives out. Low back pain
extending into the lower extremities along the S1 pathway
bilaterally.
TECHNIQUE: Contiguous axial images were obtained through the Lumbar spine after
the intrathecal infusion of infusion. Coronal and sagittal
reconstructions were obtained of the axial image sets.

[Series 1: vasc adipose · 1 of 1 slices shown (1 of 10)]
[im 1/1]
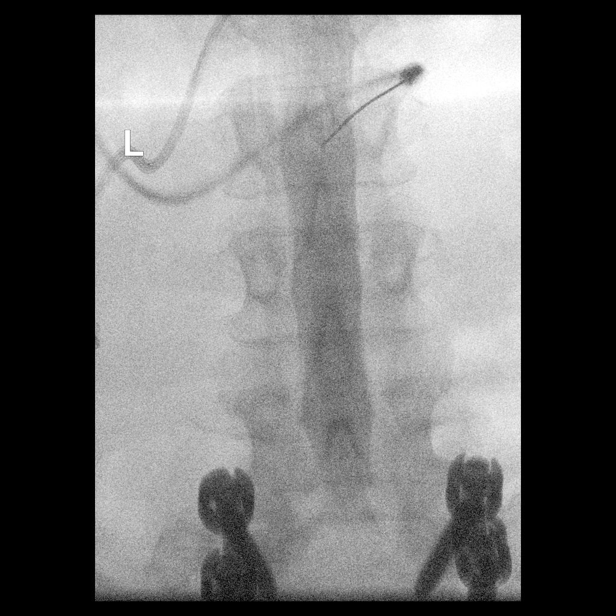

[Series 2: w lumbar spine lat · 0.15mm/px · 1 of 1 slices shown]
[im 1/1]
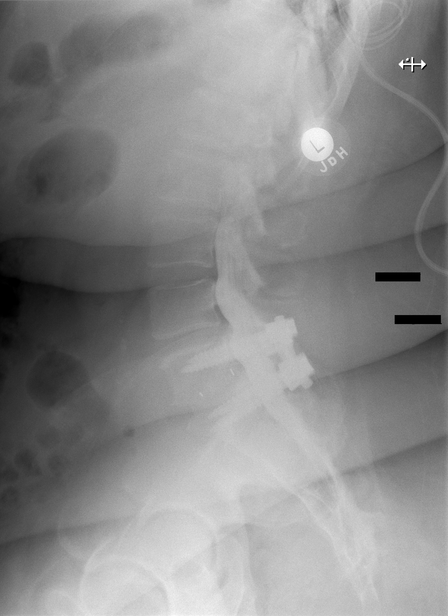

[Series 3: w lumbar spine flexion · 0.15mm/px · 1 of 1 slices shown]
[im 1/1]
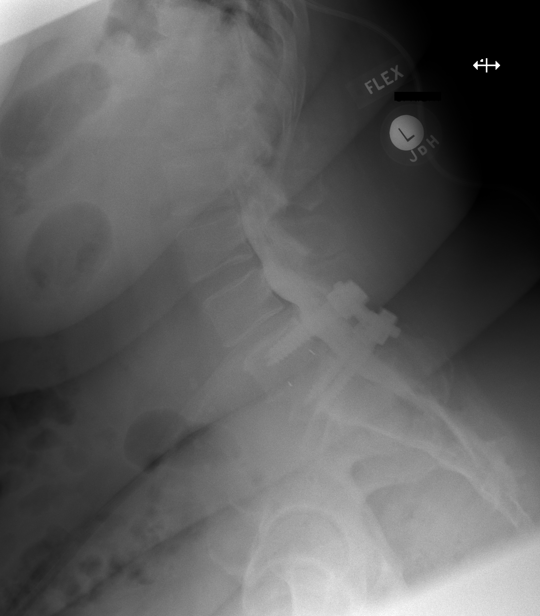

[Series 3: vasc adipose · 1 of 1 slices shown (2 of 10)]
[im 1/1]
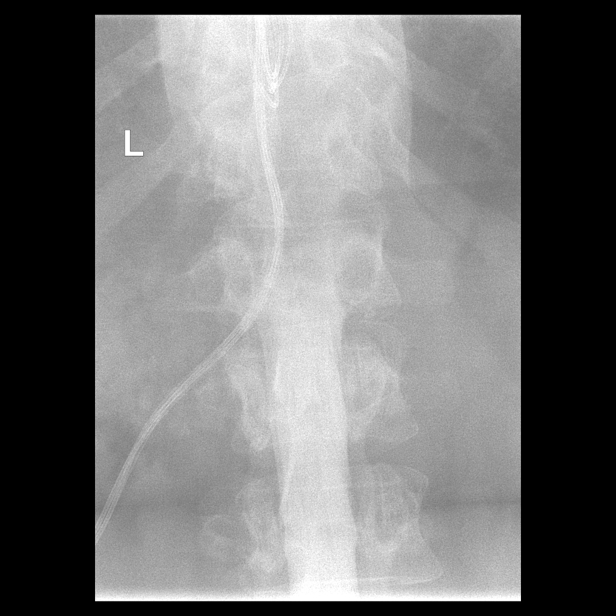

[Series 4: w lumbar spine extension · 0.15mm/px · 1 of 1 slices shown]
[im 1/1]
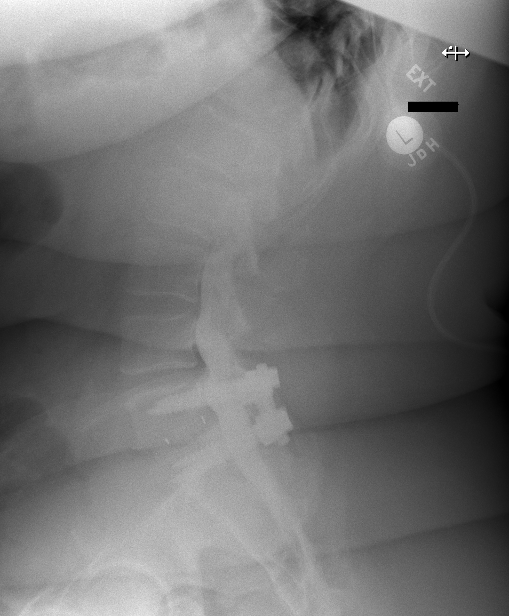

[Series 5: vasc adipose · 1 of 1 slices shown (3 of 10)]
[im 1/1]
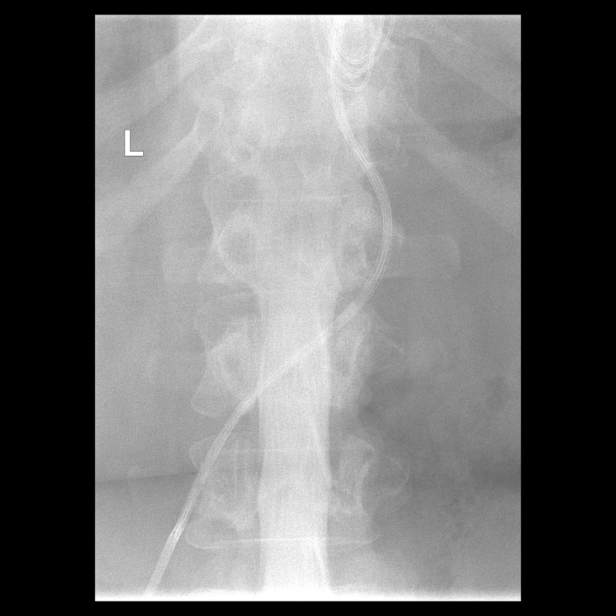

[Series 7: vasc adipose · 1 of 1 slices shown (4 of 10)]
[im 1/1]
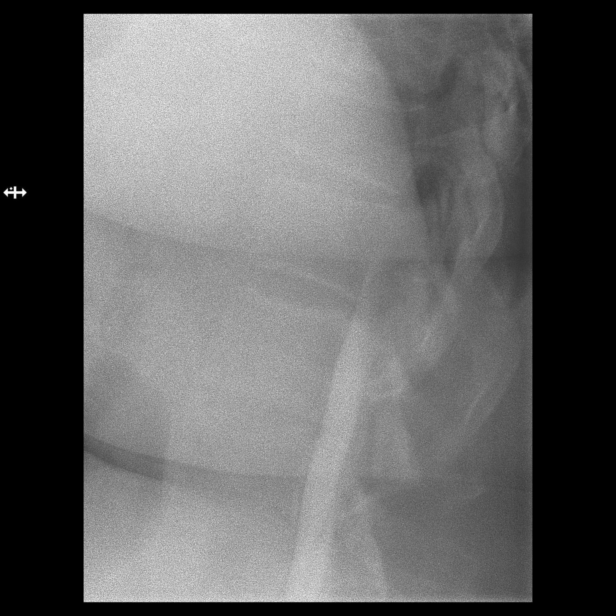

[Series 8: vasc adipose · 1 of 1 slices shown (5 of 10)]
[im 1/1]
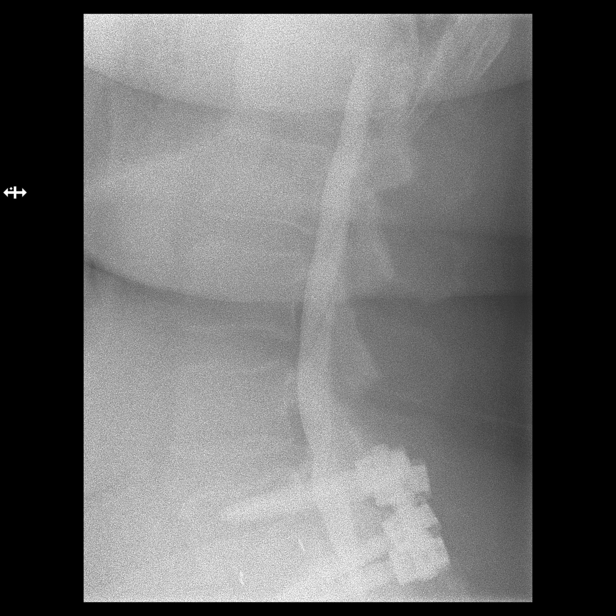

[Series 9: vasc adipose · 1 of 1 slices shown (6 of 10)]
[im 1/1]
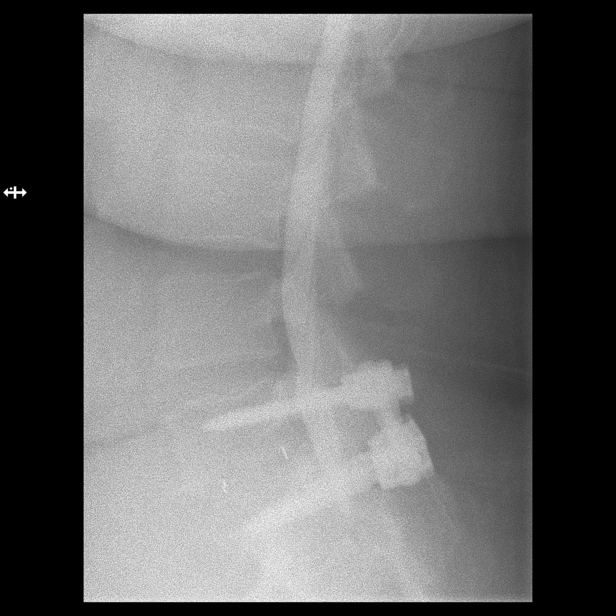

[Series 11: vasc adipose · 1 of 1 slices shown (7 of 10)]
[im 1/1]
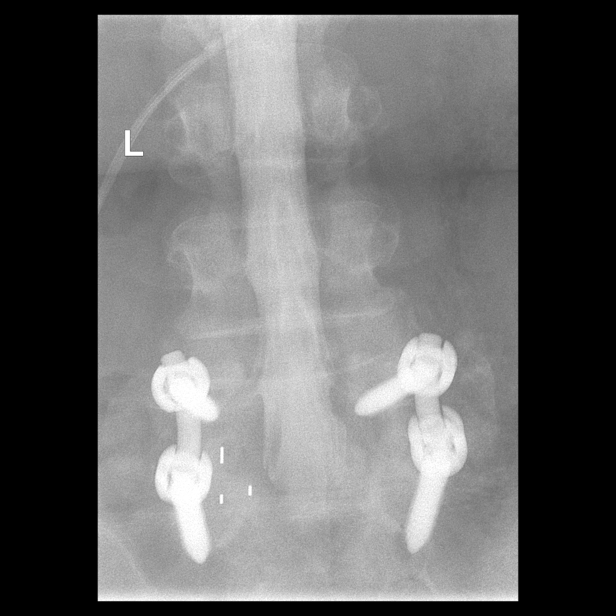

[Series 12: vasc adipose · 1 of 1 slices shown (8 of 10)]
[im 1/1]
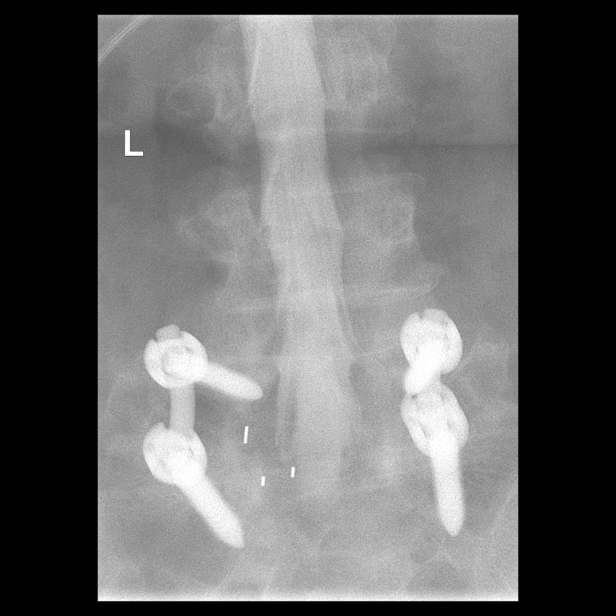

[Series 13: vasc adipose · 1 of 1 slices shown (9 of 10)]
[im 1/1]
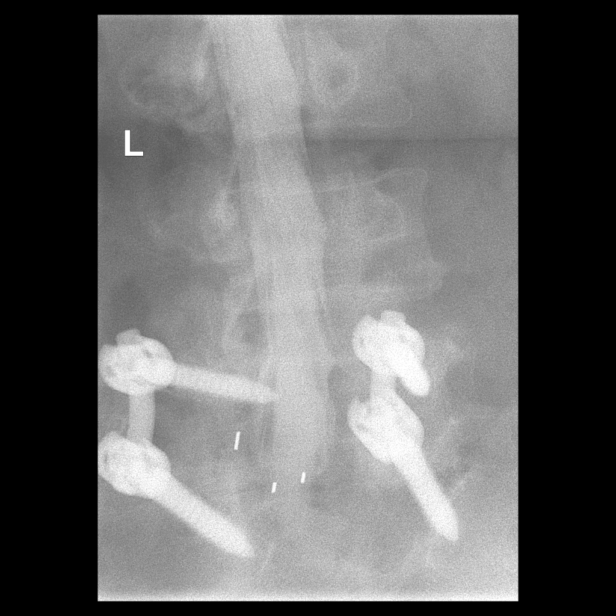

[Series 15: vasc adipose · 1 of 1 slices shown (10 of 10)]
[im 1/1]
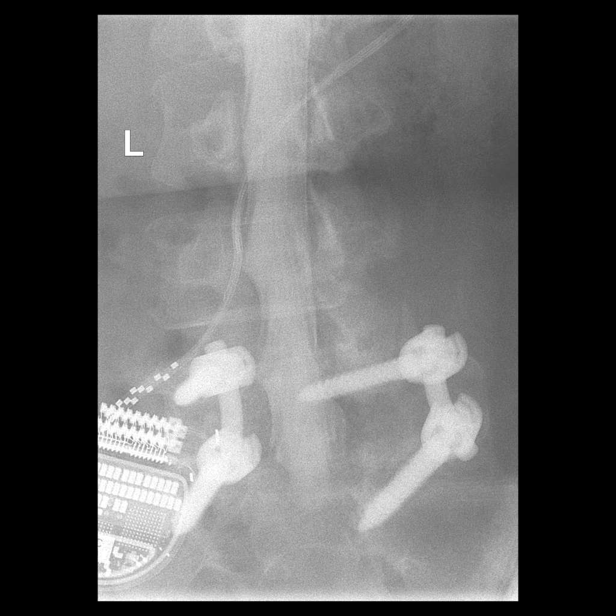

[13 of 18 positions shown; findings below may reference images not displayed]

EXAM:
LUMBAR MYELOGRAM

FLUOROSCOPY TIME:  Bing

Radiation Exposure Index (as provided by the fluoroscopic device):
412.35 uGy*m2

Fluoroscopy Time:  31 seconds

Number of Acquired Images:  17

minutes and seconds

PROCEDURE:
After thorough discussion of risks and benefits of the procedure
including bleeding, infection, injury to nerves, blood vessels,
adjacent structures as well as headache and CSF leak, written and
oral informed consent was obtained. Consent was obtained by Dr.
Prosda Jhgffjk. Time out form was completed.

Patient was positioned prone on the fluoroscopy table. Local
anesthesia was provided with 1% lidocaine without epinephrine after
prepped and draped in the usual sterile fashion. Puncture was
performed at L1-2 using a 3 1/2 inch 22-gauge spinal needle via
right paramedian approach. Using a single pass through the dura, the
needle was placed within the thecal sac, with return of clear CSF.
15 mL of Isovue 6-QWW was injected into the thecal sac, with normal
opacification of the nerve roots and cauda equina consistent with
free flow within the subarachnoid space.

I personally performed the lumbar puncture and administered the
intrathecal contrast. I also personally supervised acquisition of
the myelogram images.
FINDINGS: LUMBAR MYELOGRAM FINDINGS:

Five non rib-bearing lumbar type vertebral bodies are present.
Posterior pedicle screw and rod fixation is present at L5-S1. AP
alignment is anatomic. Adjacent level disease is present at L4-5
with mild broad-based disc protrusion. Nerve roots fill normally in
the lumbar spine bilaterally. L4-5 disc protrusion is exaggerated
with standing. Slight anterolisthesis present with standing. This
does not change significantly with flexion or extension. There is
mild disc bulging at L3-4.

CT LUMBAR MYELOGRAM FINDINGS:

The lumbar spine is imaged from the midbody of T10 through the
midbody of S3. AP alignment is anatomic. Conus medullaris terminates
at T12-L1. Dorsal spinal cord stimulator enters at the T9-10 level.
Facet hypertrophy contributes to mild central canal narrowing and
bilateral foraminal narrowing at T10-11. This is worse on the left.

T12-L1: Negative.

L1-2: Negative.

L2-3: Negative.

L3-4: Negative.

L4-5: A mild broad-based disc protrusion is present. Moderate facet
hypertrophy is noted. This leads to mild to moderate foraminal
stenosis bilaterally, right greater than left.

L5-S1: Wide laminectomy is noted. The central canal is decompressed.
Moderate left and mild right residual osseous foraminal narrowing is
present.
IMPRESSION: 1. Lumbar fusion and laminectomy at L5-S1 with decompression of the
central canal.
2. Residual moderate foraminal stenosis at L5-S1 is worse on the
left.
3. Adjacent level disease with a broad-based disc protrusion and
moderate facet hypertrophy at L4-5. The disc protrusion is
exaggerated with standing.
4. Mild to moderate foraminal narrowing bilaterally at L4-5 is worse
on the right.

## 2019-10-26 ENCOUNTER — Ambulatory Visit: Payer: Medicare HMO | Attending: Internal Medicine

## 2019-10-26 DIAGNOSIS — Z20822 Contact with and (suspected) exposure to covid-19: Secondary | ICD-10-CM

## 2019-10-27 LAB — NOVEL CORONAVIRUS, NAA: SARS-CoV-2, NAA: NOT DETECTED

## 2019-11-27 DIAGNOSIS — F419 Anxiety disorder, unspecified: Secondary | ICD-10-CM | POA: Diagnosis not present

## 2019-11-27 DIAGNOSIS — M48061 Spinal stenosis, lumbar region without neurogenic claudication: Secondary | ICD-10-CM | POA: Diagnosis not present

## 2019-11-27 DIAGNOSIS — H524 Presbyopia: Secondary | ICD-10-CM | POA: Diagnosis not present

## 2019-11-27 DIAGNOSIS — Z79899 Other long term (current) drug therapy: Secondary | ICD-10-CM | POA: Diagnosis not present

## 2019-11-27 DIAGNOSIS — G4733 Obstructive sleep apnea (adult) (pediatric): Secondary | ICD-10-CM | POA: Diagnosis not present

## 2019-11-27 DIAGNOSIS — M961 Postlaminectomy syndrome, not elsewhere classified: Secondary | ICD-10-CM | POA: Insufficient documentation

## 2019-11-27 DIAGNOSIS — H40153 Residual stage of open-angle glaucoma, bilateral: Secondary | ICD-10-CM | POA: Diagnosis not present

## 2019-12-04 DIAGNOSIS — Z0100 Encounter for examination of eyes and vision without abnormal findings: Secondary | ICD-10-CM | POA: Diagnosis not present

## 2019-12-11 ENCOUNTER — Other Ambulatory Visit: Payer: Self-pay

## 2019-12-11 ENCOUNTER — Encounter: Payer: Self-pay | Admitting: Internal Medicine

## 2019-12-11 ENCOUNTER — Ambulatory Visit (INDEPENDENT_AMBULATORY_CARE_PROVIDER_SITE_OTHER): Payer: Medicare HMO | Admitting: Internal Medicine

## 2019-12-11 VITALS — BP 116/78 | HR 96 | Temp 98.0°F | Ht 67.0 in | Wt 357.0 lb

## 2019-12-11 DIAGNOSIS — M25562 Pain in left knee: Secondary | ICD-10-CM | POA: Diagnosis not present

## 2019-12-11 DIAGNOSIS — F5101 Primary insomnia: Secondary | ICD-10-CM | POA: Diagnosis not present

## 2019-12-11 DIAGNOSIS — E538 Deficiency of other specified B group vitamins: Secondary | ICD-10-CM | POA: Diagnosis not present

## 2019-12-11 DIAGNOSIS — G4733 Obstructive sleep apnea (adult) (pediatric): Secondary | ICD-10-CM

## 2019-12-11 MED ORDER — CYANOCOBALAMIN 1000 MCG/ML IJ SOLN
1000.0000 ug | Freq: Once | INTRAMUSCULAR | Status: AC
  Administered 2019-12-11: 1000 ug via INTRAMUSCULAR

## 2019-12-11 NOTE — Progress Notes (Signed)
Date:  12/11/2019   Name:  Kimberly Cowan   DOB:  Feb 18, 1966   MRN:  [id number]   Chief Complaint: Knee Pain (Left knee pain. No better.), b12 def (Injection.), and Insomnia (In the last 7 days she has only slept 1 hour. Tried gabapentin and melatonin. )  Knee Pain  There was no injury mechanism. The pain is present in the left knee. The quality of the pain is described as aching and shooting. The pain is moderate. The pain has been fluctuating since onset. Treatments tried: she has hx of left knee surgery 2018 Dr. Rudene Christians and PT last year for hip and knee pain.   Improvement on treatment: does not want to see Dr. Rudene Christians - has seen [facility].  Insomnia Primary symptoms: fragmented sleep, sleep disturbance, difficulty falling asleep.  The current episode started in the past 7 days. The problem occurs nightly. The problem is unchanged. The symptoms are aggravated by caffeine. How many beverages per day that contain caffeine: 0 - 1.  Past treatments include medication. The treatment provided no relief. How long after going to bed to you fall asleep: over an hour.    B12 deficiency - getting B12 injections monthly previously but the last documented here was in November.   12/2017 Left knee films: FINDINGS: Soft tissues are unremarkable. Mild degenerative change with small loose bodies. No acute bony or joint abnormality. No evidence of acute fracture.  IMPRESSION: Mild degenerative changes with small loose bodies. No acute bony abnormality.  Lab Results  Component Value Date   CREATININE 0.73 01/09/2019   BUN 8 01/09/2019   NA 141 01/09/2019   K 4.1 01/09/2019   CL 104 01/09/2019   CO2 22 01/09/2019   Lab Results  Component Value Date   CHOL 135 01/09/2019   HDL 62 01/09/2019   LDLCALC 58 01/09/2019   TRIG 76 01/09/2019   CHOLHDL 2.2 01/09/2019   Lab Results  Component Value Date   TSH 2.680 01/09/2019   Lab Results  Component Value Date   HGBA1C 6.3 (H)  01/09/2019     Review of Systems  Constitutional: Negative for diaphoresis, fatigue and fever.  Eyes: Negative for visual disturbance.  Respiratory: Negative for cough and shortness of breath.   Cardiovascular: Negative for chest pain and palpitations.  Musculoskeletal: Positive for arthralgias, gait problem and joint swelling.  Neurological: Negative for dizziness, light-headedness and headaches.  Psychiatric/Behavioral: Positive for sleep disturbance. Negative for dysphoric mood. The patient has insomnia. The patient is not nervous/anxious.     Patient Active Problem List   Diagnosis Date Noted  . Major depressive disorder with single episode, in full remission ([place]) 08/15/2018  . Mild peripheral edema 11/15/2017  . Perforated ear drum, left 06/25/2017  . History of pulmonary embolism 06/10/2017  . Environmental and seasonal allergies 04/07/2017  . Tinea versicolor 02/03/2017  . Vasomotor symptoms due to menopause 11/11/2016  . Morbid obesity ([place]) 09/10/2016  . B12 nutritional deficiency 08/05/2016  . Obstructive sleep apnea of adult 03/04/2016  . Bilateral lumbar radiculopathy 08/14/2015  . Status post gastric bypass for obesity 07/04/2015  . Prediabetes 07/01/2015  . Headache, migraine 04/04/2012    Allergies  Allergen Reactions  . Morphine And Related Hives, Shortness Of Breath, Nausea And Vomiting and Other (See Comments)    "can't breathe"  . Penicillins Anaphylaxis, Hives, Shortness Of Breath and Nausea And Vomiting    "can't breathe", Has patient had a PCN reaction causing immediate rash, facial/tongue/throat  swelling, SOB or lightheadedness with hypotension: Yes Has patient had a PCN reaction causing severe rash involving mucus membranes or skin necrosis: No Has patient had a PCN reaction that required hospitalization No Has patient had a PCN reaction occurring within the last 10 years: No If all of the above answers are "NO", then may proceed with Cephalosporin  use.   . Latex Hives  . Baclofen Nausea Only    Jittery, anxious    Past Surgical History:  Procedure Laterality Date  . ABDOMINAL HYSTERECTOMY  10/2006  . BACK SURGERY  2014   lumbar fusion  . BACK SURGERY  2009   discectomy  . [place]  . COLONOSCOPY WITH PROPOFOL N/A 04/12/2018   Procedure: COLONOSCOPY WITH PROPOFOL;  Surgeon: Lin Landsman, MD;  Location: [facility] ENDOSCOPY;  Service: Gastroenterology;  Laterality: N/A;  . GASTRIC BYPASS  2005  . KNEE ARTHROSCOPY WITH LATERAL MENISECTOMY Right 05/27/2017   Procedure: KNEE ARTHROSCOPY WITH LATERAL MENISECTOMY;  Surgeon: Hessie Knows, MD;  Location: [facility] ORS;  Service: Orthopedics;  Laterality: Right;  . KNEE ARTHROSCOPY WITH LATERAL RELEASE Right 05/27/2017   Procedure: KNEE ARTHROSCOPY WITH LATERAL RELEASE;  Surgeon: Hessie Knows, MD;  Location: [facility] ORS;  Service: Orthopedics;  Laterality: Right;  . LAPAROSCOPIC SALPINGO OOPHERECTOMY Bilateral 10/05/2016   Procedure: LAPAROSCOPIC SALPINGO OOPHORECTOMY;  Surgeon: Brayton Mars, MD;  Location: [facility] ORS;  Service: Gynecology;  Laterality: Bilateral;  . REDUCTION MAMMAPLASTY Bilateral    2001  . SPINAL CORD STIMULATOR INSERTION N/A 03/20/2016   Procedure: LUMBAR SPINAL CORD STIMULATOR INSERTION;  Surgeon: Eustace Moore, MD;  Location: [facility] ORS;  Service: Neurosurgery;  Laterality: N/A;  . SPINE SURGERY  2009 2014 2017  . TONSILLECTOMY      Social History   Tobacco Use  . Smoking status: Never Smoker  . Smokeless tobacco: Never Used  . Tobacco comment: smoking cessation materials not required  Substance Use Topics  . Alcohol use: No    Alcohol/week: 0.0 standard drinks  . Drug use: No     Medication list has been reviewed and updated.  Current Meds  Medication Sig  . cyclobenzaprine (FLEXERIL) 10 MG tablet Take 1 tablet by mouth 3 (three) times daily. Pt takes one tablet qhs  . DULoxetine (CYMBALTA) 30 MG capsule duloxetine 30 mg  capsule,delayed release  . gabapentin (NEURONTIN) 300 MG capsule Take 600 mg by mouth at bedtime.   Marland Kitchen ketoconazole (NIZORAL) 2 % shampoo Apply 1 application topically daily as needed (for skin fungus on neck/back).  . latanoprost (XALATAN) 0.005 % ophthalmic solution Place 1 drop into both eyes at bedtime.   Marland Kitchen levocetirizine (XYZAL) 5 MG tablet TAKE 1 TABLET(5 MG) BY MOUTH EVERY EVENING  . meloxicam (MOBIC) 15 MG tablet   . oxyCODONE-acetaminophen (PERCOCET/ROXICET) 5-325 MG tablet Take 1 tablet by mouth daily.  . rizatriptan (MAXALT-MLT) 10 MG disintegrating tablet DISSOLVE 1 TABLET BY MOUTH AS NEEDED FOR MIGRAINE- MAY REPEAT IN 2 HOURS AS NEEDED  . triamterene-hydrochlorothiazide (MAXZIDE) 75-50 MG tablet TAKE 1 TABLET BY MOUTH DAILY   Current Facility-Administered Medications for the 12/11/19 encounter (Office Visit) with Glean Hess, MD  Medication  . cyanocobalamin ((VITAMIN B-12)) injection 1,000 mcg    PHQ 2/9 Scores 12/11/2019 07/07/2019 03/13/2019 02/21/2019  PHQ - 2 Score 0 0 0 0  PHQ- 9 Score 8 0 - -  Exception Documentation - - - -    BP Readings from Last 3 Encounters:  12/11/19 116/78  07/07/19 120/70  03/13/19 126/72    Physical Exam Vitals and nursing note reviewed.  Constitutional:      General: She is not in acute distress.    Appearance: She is well-developed. She is obese.  HENT:     Head: Normocephalic and atraumatic.  Cardiovascular:     Rate and Rhythm: Normal rate and regular rhythm.  Pulmonary:     Effort: Pulmonary effort is normal. No respiratory distress.     Breath sounds: No wheezing or rhonchi.  Musculoskeletal:        General: Tenderness (left knee) present.     Cervical back: Normal range of motion.     Right lower leg: Edema present.     Left lower leg: Edema present.  Lymphadenopathy:     Cervical: No cervical adenopathy.  Skin:    General: Skin is warm and dry.     Findings: No rash.  Neurological:     General: No focal deficit  present.     Mental Status: She is alert and oriented to person, place, and time.  Psychiatric:        Behavior: Behavior normal.        Thought Content: Thought content normal.     Wt Readings from Last 3 Encounters:  12/11/19 (!) 357 lb (161.9 kg)  07/07/19 (!) 351 lb (159.2 kg)  03/13/19 (!) 353 lb (160.1 kg)    BP 116/78   Pulse 96   Temp 98 F (36.7 C) (Oral)   Ht 5\' 7"  (1.702 m)   Wt (!) 357 lb (161.9 kg)   SpO2 96%   BMI 55.91 kg/m   Assessment and Plan: 1. Primary insomnia Of uncertain cause but acute onset and severe Samples of Dayvigo 5 mg given; hopefully can reset sleep patterns Stop all caffeine  2. Left knee pain, unspecified chronicity Recommend that she follow up with Ortho since this is the knee with a previous procedure - Ambulatory referral to Orthopedic Surgery  3. B12 nutritional deficiency Continue monthly B12 injections - cyanocobalamin ((VITAMIN B-12)) injection 1,000 mcg  4. Obstructive sleep apnea of adult Continue to use CPAP nightly   Partially dictated using Editor, commissioning. Any errors are unintentional.  Halina Maidens, MD [facility]  12/11/2019

## 2019-12-12 ENCOUNTER — Encounter: Payer: Self-pay | Admitting: *Deleted

## 2019-12-19 DIAGNOSIS — E669 Obesity, unspecified: Secondary | ICD-10-CM | POA: Diagnosis not present

## 2019-12-19 DIAGNOSIS — S86912A Strain of unspecified muscle(s) and tendon(s) at lower leg level, left leg, initial encounter: Secondary | ICD-10-CM | POA: Diagnosis not present

## 2019-12-19 DIAGNOSIS — S86911A Strain of unspecified muscle(s) and tendon(s) at lower leg level, right leg, initial encounter: Secondary | ICD-10-CM | POA: Diagnosis not present

## 2019-12-24 ENCOUNTER — Ambulatory Visit: Payer: Medicare HMO | Attending: Internal Medicine

## 2019-12-24 DIAGNOSIS — Z23 Encounter for immunization: Secondary | ICD-10-CM

## 2019-12-24 NOTE — Progress Notes (Signed)
   Covid-19 Vaccination Clinic  Name:  Delphia Anhalt    MRN: [id number] DOB: 1966/09/07  12/24/2019  Ms. Haith-Kelly was observed post Covid-19 immunization for 30 minutes based on pre-vaccination screening without incident. She was provided with Vaccine Information Sheet and instruction to access the V-Safe system.   Ms. Cadotte was instructed to call 911 with any severe reactions post vaccine: Marland Kitchen Difficulty breathing  . Swelling of face and throat  . A fast heartbeat  . A bad rash all over body  . Dizziness and weakness   Immunizations Administered    Name Date Dose VIS Date Route   Pfizer COVID-19 Vaccine 12/24/2019  9:08 AM 0.3 mL 09/08/2019 Intramuscular   Manufacturer: [place]   Lot: [id number]   [place]: [id number]

## 2019-12-27 ENCOUNTER — Other Ambulatory Visit: Payer: Self-pay | Admitting: Internal Medicine

## 2019-12-27 ENCOUNTER — Telehealth: Payer: Self-pay | Admitting: Internal Medicine

## 2019-12-27 DIAGNOSIS — F5101 Primary insomnia: Secondary | ICD-10-CM

## 2019-12-27 MED ORDER — DAYVIGO 5 MG PO TABS: 1.0000 | ORAL_TABLET | Freq: Every evening | ORAL | 0 refills | Status: DC | PRN

## 2019-12-27 NOTE — Telephone Encounter (Signed)
Pt wants to come by and get samples of sleeping pills that were given to her before. Or does she need a prescription called in?

## 2019-12-27 NOTE — Telephone Encounter (Signed)
I sent in a prescription for Dayvigo 5 mg.  We may need to do a PA so go ahead and get a list from her of the prescription medications and over the counter meds that she has tried and failed.

## 2019-12-28 ENCOUNTER — Telehealth: Payer: Self-pay

## 2019-12-28 ENCOUNTER — Other Ambulatory Visit: Payer: Self-pay | Admitting: Internal Medicine

## 2019-12-28 DIAGNOSIS — F5101 Primary insomnia: Secondary | ICD-10-CM

## 2019-12-28 MED ORDER — BELSOMRA 10 MG PO TABS: 1.0000 | ORAL_TABLET | Freq: Every evening | ORAL | 0 refills | Status: DC | PRN

## 2019-12-28 NOTE — Telephone Encounter (Signed)
Received PA from the pharmacy. Spoke with pt and informed. Pt has tried and failed gabapentin and melatonin OTC.  CM

## 2019-12-28 NOTE — Telephone Encounter (Signed)
I will send in [place].

## 2019-12-28 NOTE — Telephone Encounter (Signed)
Tried doing a PA over the phone with pt insurance for [employer]. Itn is denied.  Patient has to try Belsomra or Trazodone first.  Estimated copayment per insurance is $1 for trazodone 50mg , and $4 for Belsomra.   Please advise,

## 2020-01-01 DIAGNOSIS — Z6841 Body Mass Index (BMI) 40.0 and over, adult: Secondary | ICD-10-CM | POA: Diagnosis not present

## 2020-01-01 DIAGNOSIS — G4733 Obstructive sleep apnea (adult) (pediatric): Secondary | ICD-10-CM | POA: Diagnosis not present

## 2020-01-01 DIAGNOSIS — R7303 Prediabetes: Secondary | ICD-10-CM | POA: Diagnosis not present

## 2020-01-01 DIAGNOSIS — Z9884 Bariatric surgery status: Secondary | ICD-10-CM | POA: Insufficient documentation

## 2020-01-01 NOTE — Telephone Encounter (Signed)
Left detailed msg informing patient of this. Told her to call us back with any questions.  CM

## 2020-01-05 DIAGNOSIS — G4733 Obstructive sleep apnea (adult) (pediatric): Secondary | ICD-10-CM | POA: Diagnosis not present

## 2020-01-05 DIAGNOSIS — K760 Fatty (change of) liver, not elsewhere classified: Secondary | ICD-10-CM | POA: Diagnosis not present

## 2020-01-05 DIAGNOSIS — Z01818 Encounter for other preprocedural examination: Secondary | ICD-10-CM | POA: Diagnosis not present

## 2020-01-05 DIAGNOSIS — Z9884 Bariatric surgery status: Secondary | ICD-10-CM | POA: Diagnosis not present

## 2020-01-10 ENCOUNTER — Encounter: Payer: Medicare HMO | Admitting: Internal Medicine

## 2020-01-12 ENCOUNTER — Other Ambulatory Visit: Payer: Self-pay | Admitting: Internal Medicine

## 2020-01-12 ENCOUNTER — Ambulatory Visit: Payer: Medicare HMO

## 2020-01-16 ENCOUNTER — Ambulatory Visit: Payer: Medicare HMO | Attending: Internal Medicine

## 2020-01-16 ENCOUNTER — Other Ambulatory Visit: Payer: Self-pay

## 2020-01-16 ENCOUNTER — Encounter: Payer: Self-pay | Admitting: Internal Medicine

## 2020-01-16 ENCOUNTER — Ambulatory Visit (INDEPENDENT_AMBULATORY_CARE_PROVIDER_SITE_OTHER): Payer: Medicare HMO | Admitting: Internal Medicine

## 2020-01-16 VITALS — BP 132/82 | HR 87 | Temp 97.1°F | Ht 67.0 in | Wt 363.0 lb

## 2020-01-16 DIAGNOSIS — Z1231 Encounter for screening mammogram for malignant neoplasm of breast: Secondary | ICD-10-CM

## 2020-01-16 DIAGNOSIS — J3089 Other allergic rhinitis: Secondary | ICD-10-CM

## 2020-01-16 DIAGNOSIS — F5101 Primary insomnia: Secondary | ICD-10-CM

## 2020-01-16 DIAGNOSIS — G4733 Obstructive sleep apnea (adult) (pediatric): Secondary | ICD-10-CM

## 2020-01-16 DIAGNOSIS — R7303 Prediabetes: Secondary | ICD-10-CM | POA: Diagnosis not present

## 2020-01-16 DIAGNOSIS — Z6841 Body Mass Index (BMI) 40.0 and over, adult: Secondary | ICD-10-CM | POA: Diagnosis not present

## 2020-01-16 DIAGNOSIS — R600 Localized edema: Secondary | ICD-10-CM | POA: Diagnosis not present

## 2020-01-16 DIAGNOSIS — G43909 Migraine, unspecified, not intractable, without status migrainosus: Secondary | ICD-10-CM

## 2020-01-16 DIAGNOSIS — M5416 Radiculopathy, lumbar region: Secondary | ICD-10-CM

## 2020-01-16 DIAGNOSIS — E538 Deficiency of other specified B group vitamins: Secondary | ICD-10-CM

## 2020-01-16 DIAGNOSIS — Z23 Encounter for immunization: Secondary | ICD-10-CM

## 2020-01-16 DIAGNOSIS — Z0000 Encounter for general adult medical examination without abnormal findings: Secondary | ICD-10-CM | POA: Diagnosis not present

## 2020-01-16 DIAGNOSIS — F325 Major depressive disorder, single episode, in full remission: Secondary | ICD-10-CM

## 2020-01-16 LAB — POCT URINALYSIS DIPSTICK
Bilirubin, UA: NEGATIVE
Blood, UA: NEGATIVE
Glucose, UA: NEGATIVE
Ketones, UA: NEGATIVE
Leukocytes, UA: NEGATIVE
Nitrite, UA: NEGATIVE
Protein, UA: NEGATIVE
Spec Grav, UA: 1.01 (ref 1.010–1.025)
Urobilinogen, UA: 0.2 E.U./dL
pH, UA: 6.5 (ref 5.0–8.0)

## 2020-01-16 MED ORDER — POTASSIUM CHLORIDE ER 10 MEQ PO CPCR: ORAL_CAPSULE | ORAL | 1 refills | Status: DC

## 2020-01-16 MED ORDER — BELSOMRA 10 MG PO TABS: 1.0000 | ORAL_TABLET | Freq: Every evening | ORAL | 5 refills | Status: DC | PRN

## 2020-01-16 MED ORDER — FUROSEMIDE 20 MG PO TABS: 20.0000 mg | ORAL_TABLET | Freq: Every day | ORAL | 3 refills | Status: DC

## 2020-01-16 MED ORDER — TRIAMTERENE-HCTZ 75-50 MG PO TABS: 1.0000 | ORAL_TABLET | Freq: Every day | ORAL | 5 refills | Status: DC

## 2020-01-16 MED ORDER — FLUTICASONE PROPIONATE 50 MCG/ACT NA SUSP: 2.0000 | Freq: Every day | NASAL | 5 refills | Status: DC

## 2020-01-16 MED ORDER — CYANOCOBALAMIN 1000 MCG/ML IJ SOLN
1000.0000 ug | Freq: Once | INTRAMUSCULAR | Status: AC
  Administered 2020-01-16: 1000 ug via INTRAMUSCULAR

## 2020-01-16 MED ORDER — LEVOCETIRIZINE DIHYDROCHLORIDE 5 MG PO TABS: ORAL_TABLET | ORAL | 0 refills | Status: DC

## 2020-01-16 NOTE — Progress Notes (Signed)
Date:  01/16/2020   Name:  Kimberly Cowan   DOB:  01/09/1966   MRN:  [id number]  Chief Complaint: Annual Exam (breast exam / no pap / B12 ) Kimberly Cowan is a 54 y.o. female who presents today for her Complete Annual Exam. She feels fairly well. She reports exercising rarely due to knee pain. She reports she is sleeping fairly well now on Belsomra.  Colonoscopy  02/2018 Mammogram  02/2019 Pap - discontinued Immunization History  Administered Date(s) Administered  . Influenza,inj,Quad PF,6+ Mos 08/14/2016, 11/15/2017, 08/15/2018, 07/07/2019  . PFIZER SARS-COV-2 Vaccination 12/24/2019, 01/16/2020  . Tdap 04/24/2014  . Zoster Recombinat (Shingrix) 01/09/2019, 03/13/2019    Migraine  This is a recurrent problem. The problem occurs intermittently (about every few months). Associated symptoms include back pain and insomnia. Pertinent negatives include no abdominal pain, coughing, dizziness, fever, hearing loss, tinnitus or vomiting. She has tried triptans for the symptoms.  Back Pain Pertinent negatives include no abdominal pain, chest pain, dysuria, fever or headaches.  Insomnia Primary symptoms: no sleep disturbance, difficulty falling asleep, frequent awakening.  The problem occurs nightly. Treatments tried: Carolynn Comment currently.  Diabetes She presents for her follow-up diabetic visit. Diabetes type: prediabetes. Her disease course has been stable. Pertinent negatives for hypoglycemia include no dizziness, headaches, nervousness/anxiousness or tremors. Pertinent negatives for diabetes include no chest pain, no fatigue, no polydipsia and no polyuria.  OSA - using nightly.  No daytime somnolence or headaches. Edema - improved control on maxzide, lasix and potassium daily.  Lab Results  Component Value Date   CREATININE 0.73 01/09/2019   BUN 8 01/09/2019   NA 141 01/09/2019   K 4.1 01/09/2019   CL 104 01/09/2019   CO2 22 01/09/2019   Lab Results    Component Value Date   CHOL 135 01/09/2019   HDL 62 01/09/2019   LDLCALC 58 01/09/2019   TRIG 76 01/09/2019   CHOLHDL 2.2 01/09/2019   Lab Results  Component Value Date   TSH 2.680 01/09/2019   Lab Results  Component Value Date   HGBA1C 6.3 (H) 01/09/2019   Lab Results  Component Value Date   WBC 4.7 01/09/2019   HGB 10.8 (L) 01/09/2019   HCT 34.9 01/09/2019   MCV 80 01/09/2019   PLT 301 01/09/2019   Lab Results  Component Value Date   ALT 12 01/09/2019   AST 18 01/09/2019   ALKPHOS 92 01/09/2019   BILITOT <0.2 01/09/2019     Review of Systems  Constitutional: Negative for chills, fatigue and fever.  HENT: Negative for congestion, hearing loss, tinnitus, trouble swallowing and voice change.   Eyes: Negative for visual disturbance (glaucoma controlled).  Respiratory: Negative for cough, chest tightness, shortness of breath and wheezing.   Cardiovascular: Negative for chest pain, palpitations and leg swelling.  Gastrointestinal: Negative for abdominal pain, constipation, diarrhea and vomiting.  Endocrine: Negative for polydipsia and polyuria.  Genitourinary: Negative for dysuria, frequency, genital sores, vaginal bleeding and vaginal discharge.  Musculoskeletal: Positive for arthralgias (both knees) and back pain. Negative for gait problem and joint swelling.  Skin: Negative for color change and rash.  Neurological: Negative for dizziness, tremors, light-headedness and headaches.  Hematological: Negative for adenopathy. Does not bruise/bleed easily.  Psychiatric/Behavioral: Negative for dysphoric mood and sleep disturbance. The patient has insomnia. The patient is not nervous/anxious.     Patient Active Problem List   Diagnosis Date Noted  . Primary insomnia 12/28/2019  . Major depressive disorder with  single episode, in full remission ([place]) 08/15/2018  . Mild peripheral edema 11/15/2017  . Perforated ear drum, left 06/25/2017  . History of pulmonary embolism  06/10/2017  . Environmental and seasonal allergies 04/07/2017  . Tinea versicolor 02/03/2017  . Vasomotor symptoms due to menopause 11/11/2016  . Morbid obesity ([place]) 09/10/2016  . B12 nutritional deficiency 08/05/2016  . Obstructive sleep apnea of adult 03/04/2016  . Bilateral lumbar radiculopathy 08/14/2015  . Status post gastric bypass for obesity 07/04/2015  . Prediabetes 07/01/2015  . Headache, migraine 04/04/2012    Allergies  Allergen Reactions  . Morphine And Related Hives, Shortness Of Breath, Nausea And Vomiting and Other (See Comments)    "can't breathe"  . Penicillins Anaphylaxis, Hives, Shortness Of Breath and Nausea And Vomiting    "can't breathe", Has patient had a PCN reaction causing immediate rash, facial/tongue/throat swelling, SOB or lightheadedness with hypotension: Yes Has patient had a PCN reaction causing severe rash involving mucus membranes or skin necrosis: No Has patient had a PCN reaction that required hospitalization No Has patient had a PCN reaction occurring within the last 10 years: No If all of the above answers are "NO", then may proceed with Cephalosporin use.   . Latex Hives  . Baclofen Nausea Only    Jittery, anxious    Past Surgical History:  Procedure Laterality Date  . ABDOMINAL HYSTERECTOMY  10/2006  . BACK SURGERY  2014   lumbar fusion  . BACK SURGERY  2009   discectomy  . [place]  . COLONOSCOPY WITH PROPOFOL N/A 04/12/2018   Procedure: COLONOSCOPY WITH PROPOFOL;  Surgeon: Lin Landsman, MD;  Location: [facility] ENDOSCOPY;  Service: Gastroenterology;  Laterality: N/A;  . GASTRIC BYPASS  2005  . KNEE ARTHROSCOPY WITH LATERAL MENISECTOMY Left 05/27/2017   Procedure: KNEE ARTHROSCOPY WITH LATERAL MENISECTOMY;  Surgeon: Hessie Knows, MD;  Location: [facility] ORS;  Service: Orthopedics;  Laterality: Left;  . KNEE ARTHROSCOPY WITH LATERAL RELEASE Left 05/27/2017   Procedure: KNEE ARTHROSCOPY WITH LATERAL RELEASE;  Surgeon:  Hessie Knows, MD;  Location: [facility] ORS;  Service: Orthopedics;  Laterality: Left;  . LAPAROSCOPIC SALPINGO OOPHERECTOMY Bilateral 10/05/2016   Procedure: LAPAROSCOPIC SALPINGO OOPHORECTOMY;  Surgeon: Brayton Mars, MD;  Location: [facility] ORS;  Service: Gynecology;  Laterality: Bilateral;  . REDUCTION MAMMAPLASTY Bilateral    2001  . SPINAL CORD STIMULATOR INSERTION N/A 03/20/2016   Procedure: LUMBAR SPINAL CORD STIMULATOR INSERTION;  Surgeon: Eustace Moore, MD;  Location: [place] NEURO ORS;  Service: Neurosurgery;  Laterality: N/A;  . SPINE SURGERY  2009 2014 2017  . TONSILLECTOMY      Social History   Tobacco Use  . Smoking status: Never Smoker  . Smokeless tobacco: Never Used  . Tobacco comment: smoking cessation materials not required  Substance Use Topics  . Alcohol use: No    Alcohol/week: 0.0 standard drinks  . Drug use: No     Medication list has been reviewed and updated.  Current Meds  Medication Sig  . cyclobenzaprine (FLEXERIL) 10 MG tablet Take 1 tablet by mouth daily. Pt takes one tablet qhs  . fluticasone (FLONASE) 50 MCG/ACT nasal spray   . gabapentin (NEURONTIN) 300 MG capsule Take 600 mg by mouth at bedtime.   Marland Kitchen ketoconazole (NIZORAL) 2 % shampoo Apply 1 application topically daily as needed (for skin fungus on neck/back).  . latanoprost (XALATAN) 0.005 % ophthalmic solution Place 1 drop into both eyes at bedtime.   Marland Kitchen levocetirizine (XYZAL) 5 MG  tablet TAKE 1 TABLET(5 MG) BY MOUTH EVERY EVENING  . oxyCODONE-acetaminophen (PERCOCET/ROXICET) 5-325 MG tablet Take 1 tablet by mouth daily.  . rizatriptan (MAXALT-MLT) 10 MG disintegrating tablet DISSOLVE 1 TABLET BY MOUTH AS NEEDED FOR MIGRAINE- MAY REPEAT IN 2 HOURS AS NEEDED  . Suvorexant (BELSOMRA) 10 MG TABS Take 1 tablet by mouth at bedtime as needed.  . triamterene-hydrochlorothiazide (MAXZIDE) 75-50 MG tablet TAKE 1 TABLET BY MOUTH DAILY  . [DISCONTINUED] furosemide (LASIX) 20 MG tablet TAKE 1 TABLET(20 MG) BY  MOUTH TWICE DAILY  . [DISCONTINUED] potassium chloride (MICRO-K) 10 MEQ CR capsule TAKE 1 CAPSULE(10 MEQ) BY MOUTH TWICE DAILY   Current Facility-Administered Medications for the 01/16/20 encounter (Office Visit) with Glean Hess, MD  Medication  . cyanocobalamin ((VITAMIN B-12)) injection 1,000 mcg    PHQ 2/9 Scores 01/16/2020 12/11/2019 07/07/2019 03/13/2019  PHQ - 2 Score 0 0 0 0  PHQ- 9 Score 0 8 0 -  Exception Documentation - - - -    BP Readings from Last 3 Encounters:  01/16/20 132/82  12/11/19 116/78  07/07/19 120/70    Physical Exam Vitals and nursing note reviewed.  Constitutional:      General: She is not in acute distress.    Appearance: She is well-developed. She is obese.  HENT:     Head: Normocephalic and atraumatic.     Right Ear: Tympanic membrane and ear canal normal.     Left Ear: Tympanic membrane and ear canal normal.     Nose:     Right Sinus: No maxillary sinus tenderness.     Left Sinus: No maxillary sinus tenderness.  Eyes:     General: No scleral icterus.       Right eye: No discharge.        Left eye: No discharge.     Conjunctiva/sclera: Conjunctivae normal.  Neck:     Thyroid: No thyromegaly.     Vascular: No carotid bruit.  Cardiovascular:     Rate and Rhythm: Normal rate and regular rhythm.     Pulses: Normal pulses.     Heart sounds: Normal heart sounds.  Pulmonary:     Effort: Pulmonary effort is normal. No respiratory distress.     Breath sounds: No wheezing.  Chest:     Breasts:        Right: No mass, nipple discharge, skin change or tenderness.        Left: No mass, nipple discharge, skin change or tenderness.  Abdominal:     General: Bowel sounds are normal.     Palpations: Abdomen is soft.     Tenderness: There is no abdominal tenderness. There is no guarding or rebound.  Musculoskeletal:        General: Normal range of motion.     Cervical back: Normal range of motion. No erythema.     Right lower leg: Edema (trace)  present.     Left lower leg: Edema (trace) present.  Lymphadenopathy:     Cervical: No cervical adenopathy.  Skin:    General: Skin is warm and dry.     Capillary Refill: Capillary refill takes less than 2 seconds.     Findings: No rash.  Neurological:     General: No focal deficit present.     Mental Status: She is alert and oriented to person, place, and time.     Cranial Nerves: No cranial nerve deficit.     Sensory: No sensory deficit.     Deep  Tendon Reflexes: Reflexes are normal and symmetric.  Psychiatric:        Attention and Perception: Attention normal.        Mood and Affect: Mood normal.        Speech: Speech normal.        Behavior: Behavior normal.     Wt Readings from Last 3 Encounters:  01/16/20 (!) 363 lb (164.7 kg)  12/11/19 (!) 357 lb (161.9 kg)  07/07/19 (!) 351 lb (159.2 kg)    BP 132/82   Pulse 87   Temp (!) 97.1 F (36.2 C) (Temporal)   Ht 5\' 7"  (1.702 m)   Wt (!) 363 lb (164.7 kg)   SpO2 97%   BMI 56.85 kg/m   Assessment and Plan: 1. Annual physical exam Normal exam except for weight - Lipid panel - TSH - POCT urinalysis dipstick  2. Encounter for screening mammogram for breast cancer To be scheduled - MM 3D SCREEN BREAST BILATERAL; Future  3. Obstructive sleep apnea of adult Good compliance with CPAP; no issues and restful sleep - CBC with Differential/Platelet  4. Bilateral lumbar radiculopathy Followed by Ortho On gabapentin  5. Localized edema Well controlled on current regimen - triamterene-hydrochlorothiazide (MAXZIDE) 75-50 MG tablet; Take 1 tablet by mouth daily.  Dispense: 30 tablet; Refill: 5 - furosemide (LASIX) 20 MG tablet; Take 1 tablet (20 mg total) by mouth daily. TAKE 1 TABLET(20 MG) BY MOUTH TWICE DAILY  Dispense: 90 tablet; Refill: 3 - potassium chloride (MICRO-K) 10 MEQ CR capsule; TAKE 1 CAPSULE(10 MEQ) BY MOUTH TWICE DAILY  Dispense: 180 capsule; Refill: 1  6. Migraine without status migrainosus, not  intractable, unspecified migraine type Headaches are unchanged.  Occurring every few months and remain responsive to Maxalt  7. Prediabetes Continue work on diet and weight loss - Comprehensive metabolic panel - Hemoglobin A1c  8. Primary insomnia Doing well now on Belsomra - Suvorexant (BELSOMRA) 10 MG TABS; Take 1 tablet by mouth at bedtime as needed.  Dispense: 30 tablet; Refill: 5  9. Morbid obesity ([place]) Pt seeing Bariatric surgery - planning to get a revision after she completes 6 mo of nutrition counselling  10. Environmental and seasonal allergies Controlled on Flonase and antihistamines - levocetirizine (XYZAL) 5 MG tablet; TAKE 1 TABLET(5 MG) BY MOUTH EVERY EVENING  Dispense: 90 tablet; Refill: 0  11. Major depressive disorder with single episode, in full remission ([place]) resolved  B12 injection given today.  Partially dictated using Editor, commissioning. Any errors are unintentional.  Halina Maidens, MD [facility]  01/16/2020

## 2020-01-16 NOTE — Progress Notes (Signed)
   Covid-19 Vaccination Clinic  Name:  Kimberly Cowan    MRN: [id number] DOB: Apr 02, 1966  01/16/2020  [name] was observed post Covid-19 immunization for 30 minutes based on pre-vaccination screening without incident. She was provided with Vaccine Information Sheet and instruction to access the V-Safe system.   [name] was instructed to call 911 with any severe reactions post vaccine: Marland Kitchen Difficulty breathing  . Swelling of face and throat  . A fast heartbeat  . A bad rash all over body  . Dizziness and weakness   Immunizations Administered    Name Date Dose VIS Date [place] COVID-19 Vaccine 01/16/2020 -- -- --   [place] COVID-19 Vaccine 01/16/2020 10:12 AM 0.3 mL 11/22/2018 Intramuscular   Manufacturer: [employer]   Lot: [id number]   [place]: [id number]

## 2020-01-17 LAB — LIPID PANEL
Chol/HDL Ratio: 2.1 ratio (ref 0.0–4.4)
Cholesterol, Total: 138 mg/dL (ref 100–199)
HDL: 65 mg/dL (ref >39)
LDL Chol Calc (NIH): 60 mg/dL (ref 0–99)
Triglycerides: 61 mg/dL (ref 0–149)
VLDL Cholesterol Cal: 13 mg/dL (ref 5–40)

## 2020-01-17 LAB — COMPREHENSIVE METABOLIC PANEL
ALT: 11 IU/L (ref 0–32)
AST: 14 IU/L (ref 0–40)
Albumin/Globulin Ratio: 1.4 (ref 1.2–2.2)
Albumin: 4 g/dL (ref 3.8–4.9)
Alkaline Phosphatase: 97 IU/L (ref 39–117)
BUN/Creatinine Ratio: 12 (ref 9–23)
BUN: 8 mg/dL (ref 6–24)
Bilirubin Total: 0.4 mg/dL (ref 0.0–1.2)
CO2: 24 mmol/L (ref 20–29)
Calcium: 9.7 mg/dL (ref 8.7–10.2)
Chloride: 104 mmol/L (ref 96–106)
Creatinine, Ser: 0.68 mg/dL (ref 0.57–1.00)
GFR calc Af Amer: 117 mL/min/{1.73_m2} (ref >59)
GFR calc non Af Amer: 102 mL/min/{1.73_m2} (ref >59)
Globulin, Total: 2.9 g/dL (ref 1.5–4.5)
Glucose: 94 mg/dL (ref 65–99)
Potassium: 4.2 mmol/L (ref 3.5–5.2)
Sodium: 140 mmol/L (ref 134–144)
Total Protein: 6.9 g/dL (ref 6.0–8.5)

## 2020-01-17 LAB — CBC WITH DIFFERENTIAL/PLATELET
Basophils Absolute: 0 10*3/uL (ref 0.0–0.2)
Basos: 1 %
EOS (ABSOLUTE): 0.2 10*3/uL (ref 0.0–0.4)
Eos: 4 %
Hematocrit: 38.7 % (ref 34.0–46.6)
Hemoglobin: 12.3 g/dL (ref 11.1–15.9)
Immature Grans (Abs): 0 10*3/uL (ref 0.0–0.1)
Immature Granulocytes: 0 %
Lymphocytes Absolute: 2.4 10*3/uL (ref 0.7–3.1)
Lymphs: 51 %
MCH: 27.6 pg (ref 26.6–33.0)
MCHC: 31.8 g/dL (ref 31.5–35.7)
MCV: 87 fL (ref 79–97)
Monocytes Absolute: 0.4 10*3/uL (ref 0.1–0.9)
Monocytes: 9 %
Neutrophils Absolute: 1.7 10*3/uL (ref 1.4–7.0)
Neutrophils: 35 %
Platelets: 301 10*3/uL (ref 150–450)
RBC: 4.45 x10E6/uL (ref 3.77–5.28)
RDW: 15.2 % (ref 11.7–15.4)
WBC: 4.7 10*3/uL (ref 3.4–10.8)

## 2020-01-17 LAB — HEMOGLOBIN A1C
Est. average glucose Bld gHb Est-mCnc: 128 mg/dL
Hgb A1c MFr Bld: 6.1 % — ABNORMAL HIGH (ref 4.8–5.6)

## 2020-01-17 LAB — TSH: TSH: 2.15 u[IU]/mL (ref 0.450–4.500)

## 2020-01-19 DIAGNOSIS — R7303 Prediabetes: Secondary | ICD-10-CM | POA: Diagnosis not present

## 2020-01-19 DIAGNOSIS — Z86711 Personal history of pulmonary embolism: Secondary | ICD-10-CM | POA: Diagnosis not present

## 2020-01-19 DIAGNOSIS — R0602 Shortness of breath: Secondary | ICD-10-CM | POA: Diagnosis not present

## 2020-01-19 DIAGNOSIS — G4733 Obstructive sleep apnea (adult) (pediatric): Secondary | ICD-10-CM | POA: Diagnosis not present

## 2020-01-19 DIAGNOSIS — Z9884 Bariatric surgery status: Secondary | ICD-10-CM | POA: Diagnosis not present

## 2020-01-19 DIAGNOSIS — Z01810 Encounter for preprocedural cardiovascular examination: Secondary | ICD-10-CM | POA: Diagnosis not present

## 2020-01-19 DIAGNOSIS — R9431 Abnormal electrocardiogram [ECG] [EKG]: Secondary | ICD-10-CM | POA: Diagnosis not present

## 2020-02-01 DIAGNOSIS — Z9884 Bariatric surgery status: Secondary | ICD-10-CM | POA: Diagnosis not present

## 2020-02-01 DIAGNOSIS — G4733 Obstructive sleep apnea (adult) (pediatric): Secondary | ICD-10-CM | POA: Diagnosis not present

## 2020-02-01 DIAGNOSIS — R7303 Prediabetes: Secondary | ICD-10-CM | POA: Diagnosis not present

## 2020-02-01 DIAGNOSIS — Z6841 Body Mass Index (BMI) 40.0 and over, adult: Secondary | ICD-10-CM | POA: Diagnosis not present

## 2020-02-01 DIAGNOSIS — Z79899 Other long term (current) drug therapy: Secondary | ICD-10-CM | POA: Diagnosis not present

## 2020-02-12 ENCOUNTER — Telehealth: Payer: Self-pay | Admitting: Internal Medicine

## 2020-02-13 ENCOUNTER — Other Ambulatory Visit: Payer: Self-pay | Admitting: Internal Medicine

## 2020-02-13 ENCOUNTER — Telehealth: Payer: Self-pay | Admitting: Internal Medicine

## 2020-02-13 DIAGNOSIS — E538 Deficiency of other specified B group vitamins: Secondary | ICD-10-CM

## 2020-02-13 MED ORDER — "SYRINGE 25G X 1-1/2"" 3 ML MISC": 1.0000 | 0 refills | Status: DC

## 2020-02-13 MED ORDER — CYANOCOBALAMIN 1000 MCG/ML IJ SOLN: 1000.0000 ug | INTRAMUSCULAR | 12 refills | Status: DC

## 2020-02-13 NOTE — Telephone Encounter (Signed)
Copied from [place] [id number]. Topic: General - Other >> Feb 13, 2020  2:58 PM Keene Breath wrote: Reason for CRM: Called to ask the nurse or doctor if it would be ok to switch the Syringe/Needle, Disp, (SYRINGE 3CC/25GX1-1/2") 25G X 1-1/2" 3 ML MISC because they only have the 25x5x8.  Please advise and call back to confirm at [phone]

## 2020-02-14 NOTE — Telephone Encounter (Signed)
Spoke with pharmacy and they found screw caps with 1 1/2 length needle and they will give the pt these instead.   CM

## 2020-02-16 ENCOUNTER — Ambulatory Visit: Payer: Medicare HMO

## 2020-02-27 DIAGNOSIS — M961 Postlaminectomy syndrome, not elsewhere classified: Secondary | ICD-10-CM | POA: Diagnosis not present

## 2020-02-27 DIAGNOSIS — M5416 Radiculopathy, lumbar region: Secondary | ICD-10-CM | POA: Diagnosis not present

## 2020-02-27 DIAGNOSIS — F329 Major depressive disorder, single episode, unspecified: Secondary | ICD-10-CM | POA: Diagnosis not present

## 2020-02-27 DIAGNOSIS — F1120 Opioid dependence, uncomplicated: Secondary | ICD-10-CM | POA: Diagnosis not present

## 2020-02-29 DIAGNOSIS — E611 Iron deficiency: Secondary | ICD-10-CM | POA: Diagnosis not present

## 2020-02-29 DIAGNOSIS — Z9884 Bariatric surgery status: Secondary | ICD-10-CM | POA: Diagnosis not present

## 2020-02-29 DIAGNOSIS — Z01810 Encounter for preprocedural cardiovascular examination: Secondary | ICD-10-CM | POA: Diagnosis not present

## 2020-02-29 DIAGNOSIS — I071 Rheumatic tricuspid insufficiency: Secondary | ICD-10-CM | POA: Diagnosis not present

## 2020-02-29 DIAGNOSIS — R0602 Shortness of breath: Secondary | ICD-10-CM | POA: Diagnosis not present

## 2020-02-29 DIAGNOSIS — R9431 Abnormal electrocardiogram [ECG] [EKG]: Secondary | ICD-10-CM | POA: Diagnosis not present

## 2020-03-13 ENCOUNTER — Ambulatory Visit: Payer: Medicare HMO

## 2020-03-20 DIAGNOSIS — Z9884 Bariatric surgery status: Secondary | ICD-10-CM | POA: Diagnosis not present

## 2020-03-20 DIAGNOSIS — E611 Iron deficiency: Secondary | ICD-10-CM | POA: Diagnosis not present

## 2020-03-20 DIAGNOSIS — D509 Iron deficiency anemia, unspecified: Secondary | ICD-10-CM | POA: Diagnosis not present

## 2020-03-27 DIAGNOSIS — Z9884 Bariatric surgery status: Secondary | ICD-10-CM | POA: Diagnosis not present

## 2020-03-27 DIAGNOSIS — D509 Iron deficiency anemia, unspecified: Secondary | ICD-10-CM | POA: Diagnosis not present

## 2020-03-27 DIAGNOSIS — E611 Iron deficiency: Secondary | ICD-10-CM | POA: Diagnosis not present

## 2020-04-09 ENCOUNTER — Other Ambulatory Visit: Payer: Self-pay | Admitting: Internal Medicine

## 2020-04-09 DIAGNOSIS — J3089 Other allergic rhinitis: Secondary | ICD-10-CM

## 2020-04-12 DIAGNOSIS — G4733 Obstructive sleep apnea (adult) (pediatric): Secondary | ICD-10-CM | POA: Diagnosis not present

## 2020-04-16 DIAGNOSIS — E611 Iron deficiency: Secondary | ICD-10-CM | POA: Diagnosis not present

## 2020-04-16 DIAGNOSIS — G4733 Obstructive sleep apnea (adult) (pediatric): Secondary | ICD-10-CM | POA: Diagnosis not present

## 2020-04-16 DIAGNOSIS — Z9884 Bariatric surgery status: Secondary | ICD-10-CM | POA: Diagnosis not present

## 2020-04-16 DIAGNOSIS — Z6841 Body Mass Index (BMI) 40.0 and over, adult: Secondary | ICD-10-CM | POA: Diagnosis not present

## 2020-04-18 ENCOUNTER — Ambulatory Visit (INDEPENDENT_AMBULATORY_CARE_PROVIDER_SITE_OTHER): Payer: Medicare HMO | Admitting: Internal Medicine

## 2020-04-18 ENCOUNTER — Encounter: Payer: Self-pay | Admitting: Internal Medicine

## 2020-04-18 ENCOUNTER — Other Ambulatory Visit: Payer: Self-pay

## 2020-04-18 VITALS — BP 118/78 | HR 93 | Temp 98.1°F | Ht 67.0 in | Wt 366.0 lb

## 2020-04-18 DIAGNOSIS — F5101 Primary insomnia: Secondary | ICD-10-CM | POA: Diagnosis not present

## 2020-04-18 DIAGNOSIS — Z6841 Body Mass Index (BMI) 40.0 and over, adult: Secondary | ICD-10-CM | POA: Diagnosis not present

## 2020-04-18 DIAGNOSIS — G4733 Obstructive sleep apnea (adult) (pediatric): Secondary | ICD-10-CM | POA: Diagnosis not present

## 2020-04-18 NOTE — Progress Notes (Signed)
Date:  04/18/2020   Name:  Kimberly Cowan   DOB:  09/20/1966   MRN:  [id number]   Chief Complaint: Weight Loss (medical clearance) and Insomnia (going on for years, hard to fall to fal asleep at night )  Insomnia Primary symptoms: sleep disturbance, difficulty falling asleep.  The problem occurs nightly. The problem has been gradually improving (but she often takes belsomra too early) since onset. Treatments tried: gabapentin, otc, belsomra.   Obesity - she is planning a gastric bypass revision in September.  She has a form for me to sign stating that I agree with the surgery and that she is tobacco, alcohol and drug free to the best of my knowledge.  OSA - she has resumed using the CPAP nightly.   Lab Results  Component Value Date   CREATININE 0.68 01/16/2020   BUN 8 01/16/2020   NA 140 01/16/2020   K 4.2 01/16/2020   CL 104 01/16/2020   CO2 24 01/16/2020   Lab Results  Component Value Date   CHOL 138 01/16/2020   HDL 65 01/16/2020   LDLCALC 60 01/16/2020   TRIG 61 01/16/2020   CHOLHDL 2.1 01/16/2020   Lab Results  Component Value Date   TSH 2.150 01/16/2020   Lab Results  Component Value Date   HGBA1C 6.1 (H) 01/16/2020   Lab Results  Component Value Date   WBC 4.7 01/16/2020   HGB 12.3 01/16/2020   HCT 38.7 01/16/2020   MCV 87 01/16/2020   PLT 301 01/16/2020   Lab Results  Component Value Date   ALT 11 01/16/2020   AST 14 01/16/2020   ALKPHOS 97 01/16/2020   BILITOT 0.4 01/16/2020     Review of Systems  Constitutional: Negative for chills, fatigue and fever.  Respiratory: Negative for cough and chest tightness.   Cardiovascular: Positive for leg swelling. Negative for chest pain and palpitations.  Musculoskeletal: Positive for back pain.  Neurological: Negative for dizziness and headaches.  Psychiatric/Behavioral: Positive for sleep disturbance. Negative for dysphoric mood. The patient has insomnia. The patient is not nervous/anxious.      Patient Active Problem List   Diagnosis Date Noted  . Primary insomnia 12/28/2019  . Mild peripheral edema 11/15/2017  . Perforated ear drum, left 06/25/2017  . History of pulmonary embolism 06/10/2017  . Environmental and seasonal allergies 04/07/2017  . Tinea versicolor 02/03/2017  . Vasomotor symptoms due to menopause 11/11/2016  . Morbid obesity ([place]) 09/10/2016  . B12 nutritional deficiency 08/05/2016  . Obstructive sleep apnea of adult 03/04/2016  . Bilateral lumbar radiculopathy 08/14/2015  . Status post gastric bypass for obesity 07/04/2015  . Prediabetes 07/01/2015  . Headache, migraine 04/04/2012    Allergies  Allergen Reactions  . Morphine And Related Hives, Shortness Of Breath, Nausea And Vomiting and Other (See Comments)    "can't breathe"  . Penicillins Anaphylaxis, Hives, Shortness Of Breath and Nausea And Vomiting    "can't breathe", Has patient had a PCN reaction causing immediate rash, facial/tongue/throat swelling, SOB or lightheadedness with hypotension: Yes Has patient had a PCN reaction causing severe rash involving mucus membranes or skin necrosis: No Has patient had a PCN reaction that required hospitalization No Has patient had a PCN reaction occurring within the last 10 years: No If all of the above answers are "NO", then may proceed with Cephalosporin use.   . Latex Hives  . Baclofen Nausea Only    Jittery, anxious    Past Surgical History:  Procedure Laterality Date  . ABDOMINAL HYSTERECTOMY  10/2006  . BACK SURGERY  2014   lumbar fusion  . BACK SURGERY  2009   discectomy  . [place]  . COLONOSCOPY WITH PROPOFOL N/A 04/12/2018   Procedure: COLONOSCOPY WITH PROPOFOL;  Surgeon: Lin Landsman, MD;  Location[facility] ENDOSCOPY;  Service: Gastroenterology;  Laterality: N/A;  . GASTRIC BYPASS  2005  . KNEE ARTHROSCOPY WITH LATERAL MENISECTOMY Left 05/27/2017   Procedure: KNEE ARTHROSCOPY WITH LATERAL MENISECTOMY;  Surgeon:  Hessie Knows, MD;  Location: [facility] ORS;  Service: Orthopedics;  Laterality: Left;  . KNEE ARTHROSCOPY WITH LATERAL RELEASE Left 05/27/2017   Procedure: KNEE ARTHROSCOPY WITH LATERAL RELEASE;  Surgeon: Hessie Knows, MD;  Location: [facility] ORS;  Service: Orthopedics;  Laterality: Left;  . LAPAROSCOPIC SALPINGO OOPHERECTOMY Bilateral 10/05/2016   Procedure: LAPAROSCOPIC SALPINGO OOPHORECTOMY;  Surgeon: Brayton Mars, MD;  Location: [facility] ORS;  Service: Gynecology;  Laterality: Bilateral;  . REDUCTION MAMMAPLASTY Bilateral    2001  . SPINAL CORD STIMULATOR INSERTION N/A 03/20/2016   Procedure: LUMBAR SPINAL CORD STIMULATOR INSERTION;  Surgeon: Eustace Moore, MD;  Location: [place] NEURO ORS;  Service: Neurosurgery;  Laterality: N/A;  . SPINE SURGERY  2009 2014 2017  . TONSILLECTOMY      Social History   Tobacco Use  . Smoking status: Never Smoker  . Smokeless tobacco: Never Used  . Tobacco comment: smoking cessation materials not required  Vaping Use  . Vaping Use: Never used  Substance Use Topics  . Alcohol use: No    Alcohol/week: 0.0 standard drinks  . Drug use: No     Medication list has been reviewed and updated.  Current Meds  Medication Sig  . CALCIUM PO Take 1 tablet by mouth daily.  . cyanocobalamin (,VITAMIN B-12,) 1000 MCG/ML injection Inject 1 mL (1,000 mcg total) into the muscle every 30 (thirty) days.  . cyclobenzaprine (FLEXERIL) 10 MG tablet Take 1 tablet by mouth daily. Pt takes one tablet qhs  . fluticasone (FLONASE) 50 MCG/ACT nasal spray Place 2 sprays into both nostrils daily.  . furosemide (LASIX) 20 MG tablet Take 1 tablet (20 mg total) by mouth daily. TAKE 1 TABLET(20 MG) BY MOUTH TWICE DAILY  . gabapentin (NEURONTIN) 300 MG capsule Take 600 mg by mouth at bedtime.   Marland Kitchen ketoconazole (NIZORAL) 2 % shampoo Apply 1 application topically daily as needed (for skin fungus on neck/back).  . latanoprost (XALATAN) 0.005 % ophthalmic solution Place 1 drop into both eyes at  bedtime.   Marland Kitchen levocetirizine (XYZAL) 5 MG tablet TAKE 1 TABLET(5 MG) BY MOUTH EVERY EVENING  . naproxen (NAPROSYN) 500 MG tablet In AM and PM  . oxyCODONE-acetaminophen (PERCOCET/ROXICET) 5-325 MG tablet Take 1 tablet by mouth daily.  . potassium chloride (MICRO-K) 10 MEQ CR capsule TAKE 1 CAPSULE(10 MEQ) BY MOUTH TWICE DAILY  . rizatriptan (MAXALT-MLT) 10 MG disintegrating tablet DISSOLVE 1 TABLET BY MOUTH AS NEEDED FOR MIGRAINE- MAY REPEAT IN 2 HOURS AS NEEDED  . Suvorexant (BELSOMRA) 10 MG TABS Take 1 tablet by mouth at bedtime as needed.  . Syringe/Needle, Disp, (SYRINGE 3CC/25GX1-1/2") 25G X 1-1/2" 3 ML MISC 1 each by Does not apply route every 30 (thirty) days.  Marland Kitchen triamterene-hydrochlorothiazide (MAXZIDE) 75-50 MG tablet Take 1 tablet by mouth daily.   Current Facility-Administered Medications for the 04/18/20 encounter (Office Visit) with Glean Hess, MD  Medication  . cyanocobalamin ((VITAMIN B-12)) injection 1,000 mcg    PHQ 2/9 Scores  04/18/2020 01/16/2020 12/11/2019 07/07/2019  PHQ - 2 Score 0 0 0 0  PHQ- 9 Score 0 0 8 0  Exception Documentation - - - -    GAD 7 : Generalized Anxiety Score 04/18/2020 01/16/2020  Nervous, Anxious, on Edge 0 0  Control/stop worrying 0 0  Worry too much - different things 0 0  Trouble relaxing 0 0  Restless 0 0  Easily annoyed or irritable 0 0  Afraid - awful might happen 0 0  Total GAD 7 Score 0 0  Anxiety Difficulty Not difficult at all Not difficult at all    BP Readings from Last 3 Encounters:  04/18/20 118/78  01/16/20 132/82  12/11/19 116/78    Physical Exam Vitals and nursing note reviewed.  Constitutional:      General: She is not in acute distress.    Appearance: She is well-developed.  HENT:     Head: Normocephalic and atraumatic.  Pulmonary:     Effort: Pulmonary effort is normal. No respiratory distress.  Musculoskeletal:        General: Normal range of motion.  Skin:    General: Skin is warm and dry.      Findings: No rash.  Neurological:     Mental Status: She is alert and oriented to person, place, and time.  Psychiatric:        Behavior: Behavior normal.        Thought Content: Thought content normal.     Wt Readings from Last 3 Encounters:  04/18/20 (!) 366 lb (166 kg)  01/16/20 (!) 363 lb (164.7 kg)  12/11/19 (!) 357 lb (161.9 kg)    BP 118/78   Pulse 93   Temp 98.1 F (36.7 C) (Oral)   Ht 5\' 7"  (1.702 m)   Wt (!) 366 lb (166 kg)   SpO2 95%   BMI 57.32 kg/m   Assessment and Plan: 1. Primary insomnia Try to increase Belsomra to 20 mg - call for new Rx if needed Take just before going to bed.  2. Morbid obesity ([place]) Sign for surgery is signed and faxed.  3. Obstructive sleep apnea of adult Encourage continued nightly use of CPAP   Partially dictated using Editor, commissioning. Any errors are unintentional.  Halina Maidens, MD [facility]  04/18/2020

## 2020-04-22 ENCOUNTER — Encounter: Payer: Self-pay | Admitting: Internal Medicine

## 2020-04-22 ENCOUNTER — Other Ambulatory Visit: Payer: Self-pay | Admitting: Internal Medicine

## 2020-04-22 DIAGNOSIS — F5101 Primary insomnia: Secondary | ICD-10-CM

## 2020-04-22 MED ORDER — BELSOMRA 20 MG PO TABS: 20.0000 mg | ORAL_TABLET | Freq: Every evening | ORAL | 5 refills | Status: DC | PRN

## 2020-04-22 NOTE — Telephone Encounter (Signed)
Please Advise.  KP

## 2020-04-26 DIAGNOSIS — Z9884 Bariatric surgery status: Secondary | ICD-10-CM | POA: Diagnosis not present

## 2020-04-26 DIAGNOSIS — Z713 Dietary counseling and surveillance: Secondary | ICD-10-CM | POA: Diagnosis not present

## 2020-04-30 ENCOUNTER — Other Ambulatory Visit: Payer: Self-pay

## 2020-04-30 ENCOUNTER — Ambulatory Visit
Admission: RE | Admit: 2020-04-30 | Discharge: 2020-04-30 | Disposition: A | Discharge status: Home / Self Care | Payer: Medicare HMO | Source: Ambulatory Visit | Attending: Internal Medicine | Admitting: Internal Medicine

## 2020-04-30 DIAGNOSIS — Z1231 Encounter for screening mammogram for malignant neoplasm of breast: Secondary | ICD-10-CM | POA: Insufficient documentation

## 2020-05-10 ENCOUNTER — Other Ambulatory Visit: Payer: Self-pay | Admitting: Internal Medicine

## 2020-05-10 ENCOUNTER — Encounter: Payer: Self-pay | Admitting: Internal Medicine

## 2020-05-10 DIAGNOSIS — E538 Deficiency of other specified B group vitamins: Secondary | ICD-10-CM

## 2020-05-10 MED ORDER — "SYRINGE 25G X 1-1/2"" 3 ML MISC": 1.0000 | 0 refills | Status: DC

## 2020-05-24 DIAGNOSIS — Z9884 Bariatric surgery status: Secondary | ICD-10-CM | POA: Diagnosis not present

## 2020-05-24 DIAGNOSIS — Z713 Dietary counseling and surveillance: Secondary | ICD-10-CM | POA: Diagnosis not present

## 2020-05-30 DIAGNOSIS — Z9884 Bariatric surgery status: Secondary | ICD-10-CM | POA: Diagnosis not present

## 2020-05-30 DIAGNOSIS — G4733 Obstructive sleep apnea (adult) (pediatric): Secondary | ICD-10-CM | POA: Diagnosis not present

## 2020-05-30 DIAGNOSIS — Z6841 Body Mass Index (BMI) 40.0 and over, adult: Secondary | ICD-10-CM | POA: Diagnosis not present

## 2020-05-30 DIAGNOSIS — E611 Iron deficiency: Secondary | ICD-10-CM | POA: Diagnosis not present

## 2020-06-04 DIAGNOSIS — M48061 Spinal stenosis, lumbar region without neurogenic claudication: Secondary | ICD-10-CM | POA: Diagnosis not present

## 2020-06-04 DIAGNOSIS — Z79899 Other long term (current) drug therapy: Secondary | ICD-10-CM | POA: Diagnosis not present

## 2020-06-04 DIAGNOSIS — F419 Anxiety disorder, unspecified: Secondary | ICD-10-CM | POA: Diagnosis not present

## 2020-06-04 DIAGNOSIS — I10 Essential (primary) hypertension: Secondary | ICD-10-CM

## 2020-06-04 DIAGNOSIS — M47816 Spondylosis without myelopathy or radiculopathy, lumbar region: Secondary | ICD-10-CM | POA: Diagnosis not present

## 2020-06-04 HISTORY — DX: Essential (primary) hypertension: I10

## 2020-06-05 DIAGNOSIS — F419 Anxiety disorder, unspecified: Secondary | ICD-10-CM | POA: Diagnosis not present

## 2020-06-07 DIAGNOSIS — F419 Anxiety disorder, unspecified: Secondary | ICD-10-CM | POA: Diagnosis not present

## 2020-06-10 ENCOUNTER — Ambulatory Visit (INDEPENDENT_AMBULATORY_CARE_PROVIDER_SITE_OTHER): Payer: Medicare HMO

## 2020-06-10 ENCOUNTER — Encounter: Payer: Self-pay | Admitting: Family Medicine

## 2020-06-10 ENCOUNTER — Encounter: Payer: Self-pay | Admitting: Internal Medicine

## 2020-06-10 DIAGNOSIS — Z0000 Encounter for general adult medical examination without abnormal findings: Secondary | ICD-10-CM

## 2020-06-10 DIAGNOSIS — Z789 Other specified health status: Secondary | ICD-10-CM

## 2020-06-10 NOTE — Progress Notes (Addendum)
Subjective:   Kimberly Cowan is a 54 y.o. female who presents for Medicare Annual (Subsequent) preventive examination.  Virtual Visit via Telephone Note  I connected with  Kimberly Cowan on 06/10/20 at  2:40 PM EDT by telephone and verified that I am speaking with the correct person using two identifiers.  Medicare Annual Wellness visit completed telephonically due to Covid-19 pandemic.   Location: Patient: home Provider: [facility]   I discussed the limitations, risks, security and privacy concerns of performing an evaluation and management service by telephone and the availability of in person appointments. The patient expressed understanding and agreed to proceed.  Unable to perform video visit due to video visit attempted and failed and/or patient does not have video capability.   Some vital signs may be absent or patient reported.   Clemetine Marker, LPN    Review of Systems     Cardiac Risk Factors include: hypertension;obesity (BMI >30kg/m2)     Objective:    Today's Vitals   06/10/20 1427  PainSc: 6    There is no height or weight on file to calculate BMI.  Advanced Directives 06/10/2020 03/13/2019 10/10/2018 06/23/2018 05/23/2018 05/04/2018 04/12/2018  Does Patient Have a Medical Advance Directive? Yes No No No No No No  Type of Paramedic of [place];Living will - - - - - -  Copy of [place] in Chart? No - copy requested - - - - - -  Would patient like information on creating a medical advance directive? - Yes (MAU/Ambulatory/Procedural Areas - Information given) No - Patient declined No - Patient declined No - Patient declined - -  Pre-existing out of facility DNR order (yellow form or pink MOST form) - - - - - - -    Current Medications (verified) Outpatient Encounter Medications as of 06/10/2020  Medication Sig   B Complex Vitamins (VITAMIN B-COMPLEX) TABS Take 1 tablet by mouth daily.   B-D 3CC  LUER-LOK SYR 25GX1" 25G X 1" 3 ML MISC    calcium carbonate (OS-CAL) 600 MG tablet Take by mouth. Take 2 tablets by mouth in the morning and 1 tablet at night   cyanocobalamin (,VITAMIN B-12,) 1000 MCG/ML injection Inject 1 mL (1,000 mcg total) into the muscle every 30 (thirty) days.   cyclobenzaprine (FLEXERIL) 10 MG tablet Take 1 tablet by mouth daily. Pt takes one tablet qhs   fluticasone (FLONASE) 50 MCG/ACT nasal spray Place 2 sprays into both nostrils daily.   furosemide (LASIX) 20 MG tablet Take 1 tablet (20 mg total) by mouth daily. TAKE 1 TABLET(20 MG) BY MOUTH TWICE DAILY   gabapentin (NEURONTIN) 300 MG capsule Take 600 mg by mouth at bedtime.    ketoconazole (NIZORAL) 2 % shampoo Apply 1 application topically daily as needed (for skin fungus on neck/back).   latanoprost (XALATAN) 0.005 % ophthalmic solution Place 1 drop into both eyes at bedtime.    levocetirizine (XYZAL) 5 MG tablet TAKE 1 TABLET(5 MG) BY MOUTH EVERY EVENING   Multiple Vitamins-Minerals (ONE-A-DAY WOMENS) tablet Take by mouth.   naproxen (NAPROSYN) 500 MG tablet In AM and PM   oxyCODONE-acetaminophen (PERCOCET/ROXICET) 5-325 MG tablet Take 1 tablet by mouth daily.   pantoprazole (PROTONIX) 40 MG tablet Take 1 tablet by mouth daily.   potassium chloride (MICRO-K) 10 MEQ CR capsule TAKE 1 CAPSULE(10 MEQ) BY MOUTH TWICE DAILY   rizatriptan (MAXALT-MLT) 10 MG disintegrating tablet DISSOLVE 1 TABLET BY MOUTH AS NEEDED FOR MIGRAINE- MAY REPEAT  IN 2 HOURS AS NEEDED   Suvorexant (BELSOMRA) 20 MG TABS Take 20 mg by mouth at bedtime as needed.   Syringe/Needle, Disp, (SYRINGE 3CC/25GX1-1/2") 25G X 1-1/2" 3 ML MISC 1 each by Does not apply route every 30 (thirty) days.   triamterene-hydrochlorothiazide (MAXZIDE) 75-50 MG tablet Take 1 tablet by mouth daily.   [DISCONTINUED] CALCIUM PO Take 1 tablet by mouth daily.   Facility-Administered Encounter Medications as of 06/10/2020  Medication   cyanocobalamin  ((VITAMIN B-12)) injection 1,000 mcg    Allergies (verified) Morphine and related, Penicillins, Latex, and Baclofen   History: Past Medical History:  Diagnosis Date   Allergy 1999   SEASONAL   Anemia    Anxiety    Arm pain, left 05/17/2018   Arthritis    lower spine   Bilateral lumbar radiculopathy    Chronic back pain    Clinical depression 04/04/2012   Depression    DVT (deep venous thrombosis) ([place])    Encounter for screening colonoscopy    Glaucoma    RIGHT EYE   Headache(784.0)    migraines   Major depressive disorder with single episode, in full remission ([place]) 08/15/2018   Ovarian cyst    Pneumonia    walking pneumonia   Sleep apnea    no cpcap     last sleep study > 4 yrs   Past Surgical History:  Procedure Laterality Date   ABDOMINAL HYSTERECTOMY  10/2006   BACK SURGERY  2014   lumbar fusion   BACK SURGERY  2009   discectomy   [place]   COLONOSCOPY WITH PROPOFOL N/A 04/12/2018   Procedure: COLONOSCOPY WITH PROPOFOL;  Surgeon: Lin Landsman, MD;  Location: [facility];  Service: Gastroenterology;  Laterality: N/A;   GASTRIC BYPASS  2005   KNEE ARTHROSCOPY WITH LATERAL MENISECTOMY Left 05/27/2017   Procedure: KNEE ARTHROSCOPY WITH LATERAL MENISECTOMY;  Surgeon: Hessie Knows, MD;  Location: [facility];  Service: Orthopedics;  Laterality: Left;   KNEE ARTHROSCOPY WITH LATERAL RELEASE Left 05/27/2017   Procedure: KNEE ARTHROSCOPY WITH LATERAL RELEASE;  Surgeon: Hessie Knows, MD;  Location: [facility];  Service: Orthopedics;  Laterality: Left;   LAPAROSCOPIC SALPINGO OOPHERECTOMY Bilateral 10/05/2016   Procedure: LAPAROSCOPIC SALPINGO OOPHORECTOMY;  Surgeon: Brayton Mars, MD;  Location: [facility];  Service: Gynecology;  Laterality: Bilateral;   REDUCTION MAMMAPLASTY Bilateral    2001   SPINAL CORD STIMULATOR INSERTION N/A 03/20/2016   Procedure: LUMBAR SPINAL CORD STIMULATOR INSERTION;  Surgeon: Eustace Moore, MD;  Location: [facility];  Service: Neurosurgery;  Laterality: N/A;   SPINE SURGERY  2009 2014 2017   TONSILLECTOMY     Family History  Problem Relation Age of Onset   Diabetes Mother    Hypertension Mother    Obesity Mother    Diabetes Sister    Hypertension Sister    Hypertension Father    Obesity Daughter    Obesity Sister    Obesity Sister    Breast cancer Neg Hx    Social History   Socioeconomic History   Marital status: Widowed    Spouse name: Not on file   Number of children: 2   Years of education: some college   Highest education level: 12th grade  Occupational History   Occupation: disabled  Tobacco Use   Smoking status: Never Smoker   Smokeless tobacco: Never Used   Tobacco comment: smoking cessation materials not required  Vaping Use   Vaping Use: Never  used  Substance and Sexual Activity   Alcohol use: No    Alcohol/week: 0.0 standard drinks   Drug use: No   Sexual activity: Not Currently    Birth control/protection: Surgical  Other Topics Concern   Not on file  Social History Narrative   Pt lives with her daughter and family    Social Determinants of Health   Financial Resource Strain: Low Risk    Difficulty of Paying Living Expenses: Not very hard  Food Insecurity: No Food Insecurity   Worried About Charity fundraiser in the Last Year: Never true   Ran Out of Food in the Last Year: Never true  Transportation Needs: No Transportation Needs   Lack of Transportation (Medical): No   Lack of Transportation (Non-Medical): No  Physical Activity: Insufficiently Active   Days of Exercise per Week: 3 days   Minutes of Exercise per Session: 30 min  Stress: No Stress Concern Present   Feeling of Stress : Not at all  Social Connections: Moderately Isolated   Frequency of Communication with Friends and Family: More than three times a week   Frequency of Social Gatherings with Friends and Family: Three times a  week   Attends Religious Services: More than 4 times per year   Active Member of Clubs or Organizations: No   Attends Archivist Meetings: Never   Marital Status: Widowed    Tobacco Counseling Counseling given: Not Answered Comment: smoking cessation materials not required   Clinical Intake:  Pre-visit preparation completed: Yes  Pain : 0-10 Pain Score: 6  Pain Type: Chronic pain Pain Location: Back Pain Orientation: Lower Pain Descriptors / Indicators: Aching, Sore, Discomfort Pain Onset: More than a month ago Pain Frequency: Constant     Nutritional Risks: None Diabetes: No  How often do you need to have someone help you when you read instructions, pamphlets, or other written materials from your doctor or pharmacy?: 1 - Never    Interpreter Needed?: No  Information entered by :: Clemetine Marker LPN   Activities of Daily Living In your present state of health, do you have any difficulty performing the following activities: 06/10/2020 04/18/2020  Hearing? N N  Comment declines hearing aids -  Vision? N N  Difficulty concentrating or making decisions? N N  Walking or climbing stairs? N N  Dressing or bathing? N N  Doing errands, shopping? N N  Preparing Food and eating ? N -  Using the Toilet? N -  In the past six months, have you accidently leaked urine? Y -  Comment wears pads for protection -  Do you have problems with loss of bowel control? N -  Managing your Medications? N -  Managing your Finances? N -  Housekeeping or managing your Housekeeping? N -  Some recent data might be hidden    Patient Care Team: Glean Hess, MD as PCP - General (Internal Medicine) Earnie Larsson, MD as Consulting Physician (Neurosurgery) Marlaine Hind, MD as Consulting Physician (Physical Medicine and Rehabilitation) Eustace Moore, MD as Consulting Physician (Neurosurgery) Hessie Knows, MD as Consulting Physician (Orthopedic Surgery)  Indicate any recent  Medical Services you may have received from other than [facility] providers in the past year (date may be approximate).     Assessment:   This is a routine wellness examination for Aliese.  Hearing/Vision screen  Hearing Screening   125Hz  250Hz  500Hz  1000Hz  2000Hz  3000Hz  4000Hz  6000Hz  8000Hz   Right ear:  Left ear:           Comments: Pt denies hearing difficulty   Vision Screening Comments: Annual vision screenings with Dr. Gloriann Loan  Dietary issues and exercise activities discussed: Current Exercise Habits: Home exercise routine, Type of exercise: walking, Time (Minutes): 30, Frequency (Times/Week): 3, Weekly Exercise (Minutes/Week): 90, Intensity: Mild, Exercise limited by: orthopedic condition(s)  Goals     DIET - INCREASE WATER INTAKE     Recommend to drink at least 6-8 8oz glasses of water per day.     Exercise 150 min/wk Moderate Activity     Recommend to exercise for at least 150 minutes per week.      Depression Screen PHQ 2/9 Scores 06/10/2020 04/18/2020 01/16/2020 12/11/2019 07/07/2019 03/13/2019 02/21/2019  PHQ - 2 Score 0 0 0 0 0 0 0  PHQ- 9 Score - 0 0 8 0 - -  Exception Documentation - - - - - - -    Fall Risk Fall Risk  06/10/2020 04/18/2020 01/16/2020 03/13/2019 02/21/2019  Falls in the past year? 0 0 0 1 1  Number falls in past yr: 0 - 0 1 1  Injury with Fall? 0 - 0 1 1  Comment - - - - right knee pain- fell 2 weeks ago.   Risk Factor Category  - - - - -  Risk for fall due to : No Fall Risks No Fall Risks - History of fall(s);Impaired mobility History of fall(s);Impaired balance/gait;Impaired mobility  Risk for fall due to: Comment - - - - -  Follow up Falls prevention discussed Falls evaluation completed Falls evaluation completed Falls prevention discussed Falls evaluation completed    Any stairs in or around the home? Yes  If so, are there any without handrails? No  Home free of loose throw rugs in walkways, pet beds, electrical cords, etc? Yes  Adequate  lighting in your home to reduce risk of falls? Yes   ASSISTIVE DEVICES UTILIZED TO PREVENT FALLS:  Life alert? No  Use of a cane, walker or w/c? No  Grab bars in the bathroom? No  Shower chair or bench in shower? No  Elevated toilet seat or a handicapped toilet? No   TIMED UP AND GO:  Was the test performed? No . Telephonic visit.   Cognitive Function: 6CIT deferred for 2021 AWV; pt states no memory issues.      6CIT Screen 03/13/2019 03/09/2018  What Year? 0 points 0 points  What month? 0 points 0 points  What time? 0 points 0 points  Count back from 20 0 points 0 points  Months in reverse 0 points 0 points  Repeat phrase 0 points 0 points  Total Score 0 0    Immunizations Immunization History  Administered Date(s) Administered   Influenza,inj,Quad PF,6+ Mos 08/14/2016, 11/15/2017, 08/15/2018, 07/07/2019   PFIZER SARS-COV-2 Vaccination 12/24/2019, 01/16/2020, 01/16/2020   Tdap 04/24/2014   Zoster Recombinat (Shingrix) 01/09/2019, 03/13/2019    TDAP status: Up to date   Flu Vaccine status: Declined, Education has been provided regarding the importance of this vaccine but patient still declined. Advised may receive this vaccine at local pharmacy or Health Dept. Aware to provide a copy of the vaccination record if obtained from local pharmacy or Health Dept. Verbalized acceptance and understanding.   Pneumonia vaccine: due at age 55  Covid-19 vaccine status: Completed vaccines  Qualifies for Shingles Vaccine? Yes   Zostavax completed No   Shingrix Completed?: Yes  Screening Tests Health Maintenance  Topic Date  Due   INFLUENZA VACCINE  04/28/2020   Hepatitis C Screening  04/18/2021 (Originally 1965/11/14)   HIV Screening  09/30/2023 (Originally 03/25/1981)   MAMMOGRAM  04/30/2021   TETANUS/TDAP  04/24/2024   COLONOSCOPY  04/12/2028   COVID-19 Vaccine  Completed    Health Maintenance  Health Maintenance Due  Topic Date Due   INFLUENZA VACCINE   04/28/2020    Colorectal cancer screening: Completed 04/12/18. Repeat every 10 years   Mammogram status: Completed 04/30/20. Repeat every year   Bone density status: due at age 62  Lung Cancer Screening: (Low Dose CT Chest recommended if Age 35-80 years, 30 pack-year currently smoking OR have quit w/in 15years.) does not qualify.   Additional Screening:  Hepatitis C Screening: does qualify; postponed   Vision Screening: Recommended annual ophthalmology exams for early detection of glaucoma and other disorders of the eye. Is the patient up to date with their annual eye exam?  Yes  Who is the provider or what is the name of the office in which the patient attends annual eye exams? Dr. Gloriann Loan  Dental Screening: Recommended annual dental exams for proper oral hygiene  Community Resource Referral / Chronic Care Management: CRR required this visit?  Yes - housing resources  CCM required this visit?  No      Plan:     I have personally reviewed and noted the following in the patients chart:    Medical and social history  Use of alcohol, tobacco or illicit drugs   Current medications and supplements  Functional ability and status  Nutritional status  Physical activity  Advanced directives  List of other physicians  Hospitalizations, surgeries, and ER visits in previous 12 months  Vitals  Screenings to include cognitive, depression, and falls  Referrals and appointments  In addition, I have reviewed and discussed with patient certain preventive protocols, quality metrics, and best practice recommendations. A written personalized care plan for preventive services as well as general preventive health recommendations were provided to patient.     Clemetine Marker, LPN   [id number]   Nurse Notes: pt has upcoming endoscopy on 06/12/20 and plans to have revision to gastric bypass completed as well.

## 2020-06-10 NOTE — Patient Instructions (Signed)
Kimberly Cowan , Thank you for taking time to come for your Medicare Wellness Visit. I appreciate your ongoing commitment to your health goals. Please review the following plan we discussed and let me know if I can assist you in the future.   Screening recommendations/referrals: Colonoscopy: done 04/12/18 Mammogram: done 04/30/20 Bone Density: due at age 54 Recommended yearly ophthalmology/optometry visit for glaucoma screening and checkup Recommended yearly dental visit for hygiene and checkup  Vaccinations: Influenza vaccine: due Pneumococcal vaccine: due age 72 Tdap vaccine: done 04/24/14 Shingles vaccine: done 01/09/19 & 03/13/19  Covid-19: done 12/24/19 & 01/16/20  Advanced directives: Please bring a copy of your health care power of attorney and living will to the office at your convenience.  Conditions/risks identified: Recommend healthy eating and physical activity to aid in weight loss  Next appointment: Follow up in one year for your annual wellness visit.   Preventive Care 40-64 Years, Female Preventive care refers to lifestyle choices and visits with your health care provider that can promote health and wellness. What does preventive care include?  A yearly physical exam. This is also called an annual well check.  Dental exams once or twice a year.  Routine eye exams. Ask your health care provider how often you should have your eyes checked.  Personal lifestyle choices, including:  Daily care of your teeth and gums.  Regular physical activity.  Eating a healthy diet.  Avoiding tobacco and drug use.  Limiting alcohol use.  Practicing safe sex.  Taking low-dose aspirin daily starting at age 35.  Taking vitamin and mineral supplements as recommended by your health care provider. What happens during an annual well check? The services and screenings done by your health care provider during your annual well check will depend on your age, overall health, lifestyle  risk factors, and family history of disease. Counseling  Your health care provider may ask you questions about your:  Alcohol use.  Tobacco use.  Drug use.  Emotional well-being.  Home and relationship well-being.  Sexual activity.  Eating habits.  Work and work Statistician.  Method of birth control.  Menstrual cycle.  Pregnancy history. Screening  You may have the following tests or measurements:  Height, weight, and BMI.  Blood pressure.  Lipid and cholesterol levels. These may be checked every 5 years, or more frequently if you are over 67 years old.  Skin check.  Lung cancer screening. You may have this screening every year starting at age 60 if you have a 30-pack-year history of smoking and currently smoke or have quit within the past 15 years.  Fecal occult blood test (FOBT) of the stool. You may have this test every year starting at age 5.  Flexible sigmoidoscopy or colonoscopy. You may have a sigmoidoscopy every 5 years or a colonoscopy every 10 years starting at age 72.  Hepatitis C blood test.  Hepatitis B blood test.  Sexually transmitted disease (STD) testing.  Diabetes screening. This is done by checking your blood sugar (glucose) after you have not eaten for a while (fasting). You may have this done every 1-3 years.  Mammogram. This may be done every 1-2 years. Talk to your health care provider about when you should start having regular mammograms. This may depend on whether you have a family history of breast cancer.  BRCA-related cancer screening. This may be done if you have a family history of breast, ovarian, tubal, or peritoneal cancers.  Pelvic exam and Pap test. This may be done  every 3 years starting at age 51. Starting at age 4, this may be done every 5 years if you have a Pap test in combination with an HPV test.  Bone density scan. This is done to screen for osteoporosis. You may have this scan if you are at high risk for  osteoporosis. Discuss your test results, treatment options, and if necessary, the need for more tests with your health care provider. Vaccines  Your health care provider may recommend certain vaccines, such as:  Influenza vaccine. This is recommended every year.  Tetanus, diphtheria, and acellular pertussis (Tdap, Td) vaccine. You may need a Td booster every 10 years.  Zoster vaccine. You may need this after age 18.  Pneumococcal 13-valent conjugate (PCV13) vaccine. You may need this if you have certain conditions and were not previously vaccinated.  Pneumococcal polysaccharide (PPSV23) vaccine. You may need one or two doses if you smoke cigarettes or if you have certain conditions. Talk to your health care provider about which screenings and vaccines you need and how often you need them. This information is not intended to replace advice given to you by your health care provider. Make sure you discuss any questions you have with your health care provider. Document Released: 10/11/2015 Document Revised: 06/03/2016 Document Reviewed: 07/16/2015 Elsevier Interactive Patient Education  2017 [place] Prevention in the Home Falls can cause injuries. They can happen to people of all ages. There are many things you can do to make your home safe and to help prevent falls. What can I do on the outside of my home?  Regularly fix the edges of walkways and driveways and fix any cracks.  Remove anything that might make you trip as you walk through a door, such as a raised step or threshold.  Trim any bushes or trees on the path to your home.  Use bright outdoor lighting.  Clear any walking paths of anything that might make someone trip, such as rocks or tools.  Regularly check to see if handrails are loose or broken. Make sure that both sides of any steps have handrails.  Any raised decks and porches should have guardrails on the edges.  Have any leaves, snow, or ice cleared  regularly.  Use sand or salt on walking paths during winter.  Clean up any spills in your garage right away. This includes oil or grease spills. What can I do in the bathroom?  Use night lights.  Install grab bars by the toilet and in the tub and shower. Do not use towel bars as grab bars.  Use non-skid mats or decals in the tub or shower.  If you need to sit down in the shower, use a plastic, non-slip stool.  Keep the floor dry. Clean up any water that spills on the floor as soon as it happens.  Remove soap buildup in the tub or shower regularly.  Attach bath mats securely with double-sided non-slip rug tape.  Do not have throw rugs and other things on the floor that can make you trip. What can I do in the bedroom?  Use night lights.  Make sure that you have a light by your bed that is easy to reach.  Do not use any sheets or blankets that are too big for your bed. They should not hang down onto the floor.  Have a firm chair that has side arms. You can use this for support while you get dressed.  Do not have  throw rugs and other things on the floor that can make you trip. What can I do in the kitchen?  Clean up any spills right away.  Avoid walking on wet floors.  Keep items that you use a lot in easy-to-reach places.  If you need to reach something above you, use a strong step stool that has a grab bar.  Keep electrical cords out of the way.  Do not use floor polish or wax that makes floors slippery. If you must use wax, use non-skid floor wax.  Do not have throw rugs and other things on the floor that can make you trip. What can I do with my stairs?  Do not leave any items on the stairs.  Make sure that there are handrails on both sides of the stairs and use them. Fix handrails that are broken or loose. Make sure that handrails are as long as the stairways.  Check any carpeting to make sure that it is firmly attached to the stairs. Fix any carpet that is loose  or worn.  Avoid having throw rugs at the top or bottom of the stairs. If you do have throw rugs, attach them to the floor with carpet tape.  Make sure that you have a light switch at the top of the stairs and the bottom of the stairs. If you do not have them, ask someone to add them for you. What else can I do to help prevent falls?  Wear shoes that:  Do not have high heels.  Have rubber bottoms.  Are comfortable and fit you well.  Are closed at the toe. Do not wear sandals.  If you use a stepladder:  Make sure that it is fully opened. Do not climb a closed stepladder.  Make sure that both sides of the stepladder are locked into place.  Ask someone to hold it for you, if possible.  Clearly mark and make sure that you can see:  Any grab bars or handrails.  First and last steps.  Where the edge of each step is.  Use tools that help you move around (mobility aids) if they are needed. These include:  Canes.  Walkers.  Scooters.  Crutches.  Turn on the lights when you go into a dark area. Replace any light bulbs as soon as they burn out.  Set up your furniture so you have a clear path. Avoid moving your furniture around.  If any of your floors are uneven, fix them.  If there are any pets around you, be aware of where they are.  Review your medicines with your doctor. Some medicines can make you feel dizzy. This can increase your chance of falling. Ask your doctor what other things that you can do to help prevent falls. This information is not intended to replace advice given to you by your health care provider. Make sure you discuss any questions you have with your health care provider. Document Released: 07/11/2009 Document Revised: 02/20/2016 Document Reviewed: 10/19/2014 Elsevier Interactive Patient Education  2017 [employer].

## 2020-06-12 DIAGNOSIS — Z79899 Other long term (current) drug therapy: Secondary | ICD-10-CM | POA: Diagnosis not present

## 2020-06-12 DIAGNOSIS — Z880 Allergy status to penicillin: Secondary | ICD-10-CM | POA: Diagnosis not present

## 2020-06-12 DIAGNOSIS — Z86711 Personal history of pulmonary embolism: Secondary | ICD-10-CM | POA: Diagnosis not present

## 2020-06-12 DIAGNOSIS — Z885 Allergy status to narcotic agent status: Secondary | ICD-10-CM | POA: Diagnosis not present

## 2020-06-12 DIAGNOSIS — R1013 Epigastric pain: Secondary | ICD-10-CM | POA: Diagnosis not present

## 2020-06-12 DIAGNOSIS — Z91040 Latex allergy status: Secondary | ICD-10-CM | POA: Diagnosis not present

## 2020-06-12 DIAGNOSIS — G4733 Obstructive sleep apnea (adult) (pediatric): Secondary | ICD-10-CM | POA: Diagnosis not present

## 2020-06-12 DIAGNOSIS — Z9884 Bariatric surgery status: Secondary | ICD-10-CM | POA: Diagnosis not present

## 2020-06-12 DIAGNOSIS — Z6841 Body Mass Index (BMI) 40.0 and over, adult: Secondary | ICD-10-CM | POA: Diagnosis not present

## 2020-06-21 ENCOUNTER — Telehealth: Payer: Self-pay

## 2020-06-21 NOTE — Telephone Encounter (Signed)
° °   MA9/24/2021 2nd Attempt   Name: Kimberly Cowan    MRN: [id number]    DOB: May 11, 1966    AGE: 54 y.o.    GENDER: female    PCP Glean Hess, MD.   06/21/20 Spoke with patient she received emailed housing resources.  She is familiar with [employer] and does not want to live in low-income housing.  I provided a link for [employer] housing website so she can specify what she is looking for throughout [facility].     Seraj Dunnam, AAS Paralegal, [place] [facility]  [address], [place] [address] [email or web address]   [phone]   [email or web address]

## 2020-06-21 NOTE — Telephone Encounter (Signed)
° °   MA9/24/2021 1st Attempt   Name: Candita Borenstein    MRN: [id number]    DOB: Apr 21, 1966    AGE: 54 y.o.    GENDER: female    PCP Glean Hess, MD.   06/21/20 Left message on voicemail for patient to return my call regarding housing resources. Will call patient again next week.   Delson Dulworth, AAS Paralegal, [place] [facility]  [address] [place], [place] [address] [email or web address]   [phone]   [email or web address]

## 2020-06-24 ENCOUNTER — Telehealth: Payer: Self-pay

## 2020-06-24 NOTE — Telephone Encounter (Signed)
    MA9/27/2021   Name: [name]   MRN: [id number]   DOB: [birth year]   AGE: 54 y.o.   GENDER: female   PCP Glean Hess, MD.   06/24/20 Spoke with patient she plans on searching the [employer] and other emailed resources for housing.  No other resources are needed at this time. Closing referral.    Prisha Hiley, AAS Paralegal, [place] . Embedded Care Coordination [facility]  [address], [place] [address] [email or web address]  [phone]   [email or web address]

## 2020-06-26 ENCOUNTER — Telehealth: Payer: Self-pay | Admitting: Internal Medicine

## 2020-06-26 NOTE — Telephone Encounter (Signed)
Called and left a VM for a return call about the patient. I need more details as to what she is needing. Informed her we have not ordered any procedures for this patient so if she has a pending procedure , she needs to talk to the ordering provider about this.  Awaiting call back with more DETAILS regarding this issue and what she is needing with our office and this procedure.   CM

## 2020-06-26 NOTE — Telephone Encounter (Signed)
Copied from [place] [id number]. Topic: General - Other >> Jun 26, 2020  1:26 PM Erick Blinks wrote: Reason for CRM: Pt has a pending procedure, with [facility] on 07/26/2020   Best contact: [phone] extension [phone] Rito Ehrlich, [place])

## 2020-07-04 ENCOUNTER — Other Ambulatory Visit: Payer: Self-pay | Admitting: Internal Medicine

## 2020-07-04 DIAGNOSIS — R600 Localized edema: Secondary | ICD-10-CM

## 2020-07-10 ENCOUNTER — Telehealth: Payer: Medicare HMO | Admitting: Internal Medicine

## 2020-07-10 ENCOUNTER — Encounter: Payer: Self-pay | Admitting: Internal Medicine

## 2020-07-10 ENCOUNTER — Other Ambulatory Visit: Payer: Self-pay

## 2020-07-11 ENCOUNTER — Other Ambulatory Visit: Payer: Medicare HMO

## 2020-07-11 ENCOUNTER — Telehealth: Payer: Self-pay

## 2020-07-11 ENCOUNTER — Encounter: Payer: Self-pay | Admitting: Internal Medicine

## 2020-07-11 ENCOUNTER — Other Ambulatory Visit: Payer: Self-pay

## 2020-07-11 ENCOUNTER — Ambulatory Visit (INDEPENDENT_AMBULATORY_CARE_PROVIDER_SITE_OTHER): Payer: Medicare HMO | Admitting: Internal Medicine

## 2020-07-11 VITALS — BP 138/91 | HR 67 | Temp 102.0°F | Ht 67.0 in | Wt 366.0 lb

## 2020-07-11 DIAGNOSIS — Z20822 Contact with and (suspected) exposure to covid-19: Secondary | ICD-10-CM | POA: Diagnosis not present

## 2020-07-11 DIAGNOSIS — J0100 Acute maxillary sinusitis, unspecified: Secondary | ICD-10-CM

## 2020-07-11 MED ORDER — AZITHROMYCIN 250 MG PO TABS: ORAL_TABLET | ORAL | 0 refills | Status: AC

## 2020-07-11 NOTE — Telephone Encounter (Signed)
Copied from [place] [id number]. Topic: Quick Communication - See Telephone Encounter >> Jul 10, 2020  4:49 PM Loma [place] wrote: CRM for notification. See Telephone encounter for: 07/10/20.pt called about missed appt at  4:20   time 4:49 pt states she was asleep. Wants a call back, front desk not available told pt would let Dr B know that she had called back. Pt wants [phone]

## 2020-07-11 NOTE — Telephone Encounter (Signed)
This visit type is being conducted due to national recommendations for restrictions regarding the COVID- 19 Pandemic (e.g. social distancing) in effort to limit this patients exposure and mitigate transmission in our community. This visit type is felt to be most appropriate for this patient at this time.   I connected with the patient today and received telephone consent from the patient and patient understand this consent will be good for 1 year.  CM 

## 2020-07-11 NOTE — Progress Notes (Signed)
Date:  07/11/2020   Name:  Kimberly Cowan   DOB:  08/09/1966   MRN:  [id number]  I connected with this patient, Kimberly Cowan, by telephone at the patient's home.  I verified that I am speaking with the correct person using two identifiers. This visit was conducted via telephone due to the Covid-19 outbreak from my office at [facility] in [place], [place].  Chief Complaint: Fever (Started Wednesday - fever, chills, body aches. Slight Cough - dry. No SOB. No Loss of taste or smell. )  Sinusitis This is a new problem. The current episode started in the past 7 days. The problem is unchanged. The maximum temperature recorded prior to her arrival was 101 - 101.9 F. The pain is moderate. Associated symptoms include chills, congestion, coughing (dry), headaches, sinus pressure and a sore throat. Pertinent negatives include no ear pain, hoarse voice or shortness of breath. Past treatments include acetaminophen and spray decongestants. The treatment provided mild relief.  Fever  This is a new problem. The current episode started in the past 7 days. The problem occurs daily. Progression since onset: no SOB or wheezing; no loss of taste or smell. The maximum temperature noted was 103 to 103.9 F. Associated symptoms include congestion, coughing (dry), headaches and a sore throat. Pertinent negatives include no diarrhea, ear pain, nausea, vomiting or wheezing. She has tried acetaminophen and NSAIDs for the symptoms. The treatment provided moderate relief.  She is fully vaccinated against Covid19.  No one else in the house has been ill and all are vaccinated.  Lab Results  Component Value Date   CREATININE 0.68 01/16/2020   BUN 8 01/16/2020   NA 140 01/16/2020   K 4.2 01/16/2020   CL 104 01/16/2020   CO2 24 01/16/2020   Lab Results  Component Value Date   CHOL 138 01/16/2020   HDL 65 01/16/2020   LDLCALC 60 01/16/2020   TRIG 61 01/16/2020   CHOLHDL 2.1 01/16/2020   Lab  Results  Component Value Date   TSH 2.150 01/16/2020   Lab Results  Component Value Date   HGBA1C 6.1 (H) 01/16/2020   Lab Results  Component Value Date   WBC 4.7 01/16/2020   HGB 12.3 01/16/2020   HCT 38.7 01/16/2020   MCV 87 01/16/2020   PLT 301 01/16/2020   Lab Results  Component Value Date   ALT 11 01/16/2020   AST 14 01/16/2020   ALKPHOS 97 01/16/2020   BILITOT 0.4 01/16/2020     Review of Systems  Constitutional: Positive for chills and fever.  HENT: Positive for congestion, sinus pressure and sore throat. Negative for ear pain and hoarse voice.   Respiratory: Positive for cough (dry). Negative for chest tightness, shortness of breath and wheezing.   Gastrointestinal: Negative for diarrhea, nausea and vomiting.  Neurological: Positive for headaches.    Patient Active Problem List   Diagnosis Date Noted  . Primary insomnia 12/28/2019  . Mild peripheral edema 11/15/2017  . Perforated ear drum, left 06/25/2017  . History of pulmonary embolism 06/10/2017  . Environmental and seasonal allergies 04/07/2017  . Tinea versicolor 02/03/2017  . Vasomotor symptoms due to menopause 11/11/2016  . Morbid obesity ([place]) 09/10/2016  . B12 nutritional deficiency 08/05/2016  . Obstructive sleep apnea of adult 03/04/2016  . Bilateral lumbar radiculopathy 08/14/2015  . Status post gastric bypass for obesity 07/04/2015  . Prediabetes 07/01/2015  . Headache, migraine 04/04/2012    Allergies  Allergen Reactions  .  Morphine And Related Hives, Shortness Of Breath, Nausea And Vomiting and Other (See Comments)    "can't breathe"  . Penicillins Anaphylaxis, Hives, Shortness Of Breath and Nausea And Vomiting    "can't breathe", Has patient had a PCN reaction causing immediate rash, facial/tongue/throat swelling, SOB or lightheadedness with hypotension: Yes Has patient had a PCN reaction causing severe rash involving mucus membranes or skin necrosis: No Has patient had a PCN reaction  that required hospitalization No Has patient had a PCN reaction occurring within the last 10 years: No If all of the above answers are "NO", then may proceed with Cephalosporin use.   . Latex Hives  . Baclofen Nausea Only    Jittery, anxious    Past Surgical History:  Procedure Laterality Date  . ABDOMINAL HYSTERECTOMY  10/2006  . BACK SURGERY  2014   lumbar fusion  . BACK SURGERY  2009   discectomy  . [place]  . COLONOSCOPY WITH PROPOFOL N/A 04/12/2018   Procedure: COLONOSCOPY WITH PROPOFOL;  Surgeon: Lin Landsman, MD;  Location: [facility] END[facility]COPY;  Service: Gastroenterology;  Laterality: N/A;  . GASTRIC BYPASS  2005  . KNEE ARTHROSCOPY WITH LATERAL MENISECTOMY Left 05/27/2017   Procedure: KNEE ARTHROSCOPY WITH LATERAL MENISECTOMY;  Surgeon: Hessie Knows, MD;  Location: [facility] ORS;  Service: Orthopedics;  Laterality: Left;  . KNEE ARTHROSCOPY WITH LATERAL RELEASE Left 05/27/2017   Procedure: KNEE ARTHROSCOPY WITH LATERAL RELEASE;  Surgeon: Hessie Knows, MD;  Location: [facility] ORS;  Service: Orthopedics;  Laterality: Left;  . LAPAROSCOPIC SALPINGO OOPHERECTOMY Bilateral 10/05/2016   Procedure: LAPAROSCOPIC SALPINGO OOPHORECTOMY;  Surgeon: Brayton Mars, MD;  Location: [facility] ORS;  Service: Gynecology;  Laterality: Bilateral;  . REDUCTION MAMMAPLASTY Bilateral    2001  . SPINAL CORD STIMULATOR INSERTION N/A 03/20/2016   Procedure: LUMBAR SPINAL CORD STIMULATOR INSERTION;  Surgeon: Eustace Moore, MD;  Location: [place] NEURO ORS;  Service: Neurosurgery;  Laterality: N/A;  . SPINE SURGERY  2009 2014 2017  . TONSILLECTOMY      Social History   Tobacco Use  . Smoking status: Never Smoker  . Smokeless tobacco: Never Used  . Tobacco comment: smoking cessation materials not required  Vaping Use  . Vaping Use: Never used  Substance Use Topics  . Alcohol use: No    Alcohol/week: 0.0 standard drinks  . Drug use: No     Medication list has been reviewed and  updated.  Current Meds  Medication Sig  . B Complex Vitamins (VITAMIN B-COMPLEX) TABS Take 1 tablet by mouth daily.  . B-D 3CC LUER-LOK SYR 25GX1" 25G X 1" 3 ML MISC   . calcium carbonate (OS-CAL) 600 MG tablet Take by mouth. Take 2 tablets by mouth in the morning and 1 tablet at night  . cyanocobalamin (,VITAMIN B-12,) 1000 MCG/ML injection Inject 1 mL (1,000 mcg total) into the muscle every 30 (thirty) days.  . cyclobenzaprine (FLEXERIL) 10 MG tablet Take 1 tablet by mouth daily. Pt takes one tablet qhs  . fluticasone (FLONASE) 50 MCG/ACT nasal spray Place 2 sprays into both nostrils daily.  . furosemide (LASIX) 20 MG tablet TAKE 1 TABLET(20 MG) BY MOUTH TWICE DAILY  . gabapentin (NEURONTIN) 300 MG capsule Take 600 mg by mouth at bedtime.   Marland Kitchen ketoconazole (NIZORAL) 2 % shampoo Apply 1 application topically daily as needed (for skin fungus on neck/back).  . latanoprost (XALATAN) 0.005 % ophthalmic solution Place 1 drop into both eyes at bedtime.   Marland Kitchen levocetirizine (  XYZAL) 5 MG tablet TAKE 1 TABLET(5 MG) BY MOUTH EVERY EVENING  . Multiple Vitamins-Minerals (ONE-A-DAY WOMENS) tablet Take by mouth.  . naproxen (NAPROSYN) 500 MG tablet In AM and PM  . oxyCODONE-acetaminophen (PERCOCET/ROXICET) 5-325 MG tablet Take 1 tablet by mouth daily.  . pantoprazole (PROTONIX) 40 MG tablet Take 1 tablet by mouth daily.  . potassium chloride (MICRO-K) 10 MEQ CR capsule TAKE 1 CAPSULE(10 MEQ) BY MOUTH TWICE DAILY  . rizatriptan (MAXALT-MLT) 10 MG disintegrating tablet DISSOLVE 1 TABLET BY MOUTH AS NEEDED FOR MIGRAINE- MAY REPEAT IN 2 HOURS AS NEEDED  . Suvorexant (BELSOMRA) 20 MG TABS Take 20 mg by mouth at bedtime as needed.  . Syringe/Needle, Disp, (SYRINGE 3CC/25GX1-1/2") 25G X 1-1/2" 3 ML MISC 1 each by Does not apply route every 30 (thirty) days.  Marland Kitchen triamterene-hydrochlorothiazide (MAXZIDE) 75-50 MG tablet Take 1 tablet by mouth daily.   Current Facility-Administered Medications for the 07/11/20  encounter (Office Visit) with Glean Hess, MD  Medication  . cyanocobalamin ((VITAMIN B-12)) injection 1,000 mcg    PHQ 2/9 Scores 07/11/2020 06/10/2020 04/18/2020 01/16/2020  PHQ - 2 Score 0 0 0 0  PHQ- 9 Score 0 - 0 0  Exception Documentation - - - -    GAD 7 : Generalized Anxiety Score 04/18/2020 01/16/2020  Nervous, Anxious, on Edge 0 0  Control/stop worrying 0 0  Worry too much - different things 0 0  Trouble relaxing 0 0  Restless 0 0  Easily annoyed or irritable 0 0  Afraid - awful might happen 0 0  Total GAD 7 Score 0 0  Anxiety Difficulty Not difficult at all Not difficult at all    BP Readings from Last 3 Encounters:  07/11/20 (!) 138/91  04/18/20 118/78  01/16/20 132/82    Physical Exam Constitutional:      Comments: Sounds fatigued but not breathless  HENT:     Nose:     Comments: Pt reports tenderness to palpation over frontal and maxillary sinues Pulmonary:     Comments: No cough or dyspnea noted during the call Neurological:     Mental Status: She is alert.  Psychiatric:        Attention and Perception: Attention normal.        Mood and Affect: Mood normal.     Wt Readings from Last 3 Encounters:  07/11/20 (!) 366 lb (166 kg)  04/18/20 (!) 366 lb (166 kg)  01/16/20 (!) 363 lb (164.7 kg)    BP (!) 138/91   Pulse 67   Temp (!) 102 F (38.9 C) (Oral)   Ht 5\' 7"  (1.702 m)   Wt (!) 366 lb (166 kg)   BMI 57.32 kg/m   Assessment and Plan: 1. Acute non-recurrent maxillary sinusitis Continue Flonase Use tylenol alternating with advil every three hours for fever Increase fluids - azithromycin (ZITHROMAX Z-PAK) 250 MG tablet; UAD  Dispense: 6 each; Refill: 0  2. Encounter by telehealth for suspected COVID-19 Concern for Covid infection because of persistent high fevers She is advised to go for testing today - information given - in case she worsens and needs Mab infusion.  I spent 8 minutes on this encounter.  Partially dictated using  Editor, commissioning. Any errors are unintentional.  Halina Maidens, MD [facility]  07/11/2020

## 2020-07-11 NOTE — Telephone Encounter (Signed)
Patient was considered a no show because she could not answer the phone within 20 mins of her appt time. Patient sent a my chart message about this - and I responded to see if we could do her VV this morning at 1140.   CM

## 2020-07-12 LAB — SARS-COV-2, NAA 2 DAY TAT

## 2020-07-12 LAB — NOVEL CORONAVIRUS, NAA: SARS-CoV-2, NAA: NOT DETECTED

## 2020-07-18 ENCOUNTER — Ambulatory Visit: Payer: Medicare HMO | Admitting: Internal Medicine

## 2020-07-19 DIAGNOSIS — G4733 Obstructive sleep apnea (adult) (pediatric): Secondary | ICD-10-CM | POA: Diagnosis not present

## 2020-07-24 ENCOUNTER — Other Ambulatory Visit: Payer: Self-pay | Admitting: Internal Medicine

## 2020-07-24 ENCOUNTER — Encounter: Payer: Self-pay | Admitting: Internal Medicine

## 2020-07-24 ENCOUNTER — Other Ambulatory Visit: Payer: Self-pay

## 2020-07-24 ENCOUNTER — Ambulatory Visit (INDEPENDENT_AMBULATORY_CARE_PROVIDER_SITE_OTHER): Payer: Medicare HMO | Admitting: Internal Medicine

## 2020-07-24 VITALS — BP 124/68 | HR 96 | Temp 97.9°F | Ht 67.0 in | Wt 369.0 lb

## 2020-07-24 DIAGNOSIS — R21 Rash and other nonspecific skin eruption: Secondary | ICD-10-CM | POA: Diagnosis not present

## 2020-07-24 DIAGNOSIS — R7303 Prediabetes: Secondary | ICD-10-CM

## 2020-07-24 DIAGNOSIS — G43909 Migraine, unspecified, not intractable, without status migrainosus: Secondary | ICD-10-CM | POA: Diagnosis not present

## 2020-07-24 DIAGNOSIS — Z23 Encounter for immunization: Secondary | ICD-10-CM | POA: Diagnosis not present

## 2020-07-24 MED ORDER — TRIAMCINOLONE ACETONIDE 0.5 % EX OINT: 1.0000 "application " | TOPICAL_OINTMENT | Freq: Two times a day (BID) | CUTANEOUS | 0 refills | Status: DC

## 2020-07-24 MED ORDER — TOPIRAMATE 50 MG PO TABS: 50.0000 mg | ORAL_TABLET | Freq: Every day | ORAL | 1 refills | Status: DC

## 2020-07-24 NOTE — Progress Notes (Signed)
Date:  07/24/2020   Name:  Kimberly Cowan   DOB:  Sep 19, 1966   MRN:  [id number]   Chief Complaint: Diabetes (Follow up.), Rash (Both hands. Itchy. Tried coritsone OTC and no relief. ), and Migraine (Patient still complaining of having migraines- Wants to know if she can up the dose on the rizatriptan.)  Diabetes She presents for her follow-up diabetic visit. She has type 2 (prediabetes) diabetes mellitus. Her disease course has been stable. Hypoglycemia symptoms include headaches. Pertinent negatives for hypoglycemia include no dizziness. Pertinent negatives for diabetes include no chest pain, no fatigue, no foot paresthesias and no visual change. Symptoms are stable. Risk factors for coronary artery disease include obesity (she is trying to get a gastric revision covered - has gained up to more weight than before her bypass). Current diabetic treatment includes diet. She is compliant with treatment some of the time. Her weight is increasing steadily. She is following a generally unhealthy diet.  Rash This is a chronic problem. The problem is unchanged. The affected locations include the left hand and right hand. The rash is characterized by itchiness and redness. She was exposed to nothing. Pertinent negatives include no cough, fatigue, fever, shortness of breath or vomiting. Past treatments include anti-itch cream. The treatment provided no relief.  Migraine  This is a recurrent problem. The problem occurs daily. The pain is located in the occipital and frontal region. The pain does not radiate. The pain quality is similar to prior headaches. The quality of the pain is described as stabbing. The pain is moderate. Associated symptoms include nausea and photophobia. Pertinent negatives include no coughing, dizziness, fever, visual change or vomiting. The symptoms are aggravated by work (esp working at [employer]). She has tried triptans and NSAIDs for the symptoms. The treatment  provided mild (only eases the headaches but they never go away completely - except after iron infusions they stop temporarily) relief.    Lab Results  Component Value Date   CREATININE 0.68 01/16/2020   BUN 8 01/16/2020   NA 140 01/16/2020   K 4.2 01/16/2020   CL 104 01/16/2020   CO2 24 01/16/2020   Lab Results  Component Value Date   CHOL 138 01/16/2020   HDL 65 01/16/2020   LDLCALC 60 01/16/2020   TRIG 61 01/16/2020   CHOLHDL 2.1 01/16/2020   Lab Results  Component Value Date   TSH 2.150 01/16/2020   Lab Results  Component Value Date   HGBA1C 6.1 (H) 01/16/2020   Lab Results  Component Value Date   WBC 4.7 01/16/2020   HGB 12.3 01/16/2020   HCT 38.7 01/16/2020   MCV 87 01/16/2020   PLT 301 01/16/2020   Lab Results  Component Value Date   ALT 11 01/16/2020   AST 14 01/16/2020   ALKPHOS 97 01/16/2020   BILITOT 0.4 01/16/2020     Review of Systems  Constitutional: Negative for chills, fatigue and fever.  Eyes: Positive for photophobia.  Respiratory: Negative for cough, chest tightness, shortness of breath and wheezing.   Cardiovascular: Negative for chest pain and palpitations.  Gastrointestinal: Positive for nausea. Negative for blood in stool and vomiting.  Musculoskeletal: Positive for arthralgias and gait problem.  Skin: Positive for rash.  Neurological: Positive for headaches. Negative for dizziness and syncope.  Psychiatric/Behavioral: Negative for sleep disturbance (much improved with gabapentin and belsomra).    Patient Active Problem List   Diagnosis Date Noted  . Primary insomnia 12/28/2019  . Mild  peripheral edema 11/15/2017  . Perforated ear drum, left 06/25/2017  . History of pulmonary embolism 06/10/2017  . Environmental and seasonal allergies 04/07/2017  . Tinea versicolor 02/03/2017  . Vasomotor symptoms due to menopause 11/11/2016  . Morbid obesity ([place]) 09/10/2016  . B12 nutritional deficiency 08/05/2016  . Obstructive sleep apnea  of adult 03/04/2016  . Bilateral lumbar radiculopathy 08/14/2015  . Status post gastric bypass for obesity 07/04/2015  . Prediabetes 07/01/2015  . Headache, migraine 04/04/2012    Allergies  Allergen Reactions  . Morphine And Related Hives, Shortness Of Breath, Nausea And Vomiting and Other (See Comments)    "can't breathe"  . Penicillins Anaphylaxis, Hives, Shortness Of Breath and Nausea And Vomiting    "can't breathe", Has patient had a PCN reaction causing immediate rash, facial/tongue/throat swelling, SOB or lightheadedness with hypotension: Yes Has patient had a PCN reaction causing severe rash involving mucus membranes or skin necrosis: No Has patient had a PCN reaction that required hospitalization No Has patient had a PCN reaction occurring within the last 10 years: No If all of the above answers are "NO", then may proceed with Cephalosporin use.   . Latex Hives  . Baclofen Nausea Only    Jittery, anxious    Past Surgical History:  Procedure Laterality Date  . ABDOMINAL HYSTERECTOMY  10/2006  . BACK SURGERY  2014   lumbar fusion  . BACK SURGERY  2009   discectomy  . [place]  . COLONOSCOPY WITH PROPOFOL N/A 04/12/2018   Procedure: COLONOSCOPY WITH PROPOFOL;  Surgeon: Lin Landsman, MD;  Location: [facility] ENDOSCOPY;  Service: Gastroenterology;  Laterality: N/A;  . GASTRIC BYPASS  2005  . KNEE ARTHROSCOPY WITH LATERAL MENISECTOMY Left 05/27/2017   Procedure: KNEE ARTHROSCOPY WITH LATERAL MENISECTOMY;  Surgeon: Hessie Knows, MD;  Location: [facility] ORS;  Service: Orthopedics;  Laterality: Left;  . KNEE ARTHROSCOPY WITH LATERAL RELEASE Left 05/27/2017   Procedure: KNEE ARTHROSCOPY WITH LATERAL RELEASE;  Surgeon: Hessie Knows, MD;  Location: [facility] ORS;  Service: Orthopedics;  Laterality: Left;  . LAPAROSCOPIC SALPINGO OOPHERECTOMY Bilateral 10/05/2016   Procedure: LAPAROSCOPIC SALPINGO OOPHORECTOMY;  Surgeon: Brayton Mars, MD;  Location: [facility] ORS;   Service: Gynecology;  Laterality: Bilateral;  . REDUCTION MAMMAPLASTY Bilateral    2001  . SPINAL CORD STIMULATOR INSERTION N/A 03/20/2016   Procedure: LUMBAR SPINAL CORD STIMULATOR INSERTION;  Surgeon: Eustace Moore, MD;  Location: [place] NEURO ORS;  Service: Neurosurgery;  Laterality: N/A;  . SPINE SURGERY  2009 2014 2017  . TONSILLECTOMY      Social History   Tobacco Use  . Smoking status: Never Smoker  . Smokeless tobacco: Never Used  . Tobacco comment: smoking cessation materials not required  Vaping Use  . Vaping Use: Never used  Substance Use Topics  . Alcohol use: No    Alcohol/week: 0.0 standard drinks  . Drug use: No     Medication list has been reviewed and updated.  Current Meds  Medication Sig  . B Complex Vitamins (VITAMIN B-COMPLEX) TABS Take 1 tablet by mouth daily.  . B-D 3CC LUER-LOK SYR 25GX1" 25G X 1" 3 ML MISC   . calcium carbonate (OS-CAL) 600 MG tablet Take by mouth. Take 2 tablets by mouth in the morning and 1 tablet at night  . cyanocobalamin (,VITAMIN B-12,) 1000 MCG/ML injection Inject 1 mL (1,000 mcg total) into the muscle every 30 (thirty) days.  . cyclobenzaprine (FLEXERIL) 10 MG tablet Take 1 tablet by mouth  daily. Pt takes one tablet qhs  . fluticasone (FLONASE) 50 MCG/ACT nasal spray Place 2 sprays into both nostrils daily.  . furosemide (LASIX) 20 MG tablet TAKE 1 TABLET(20 MG) BY MOUTH TWICE DAILY  . gabapentin (NEURONTIN) 300 MG capsule Take 600 mg by mouth at bedtime.   Marland Kitchen ketoconazole (NIZORAL) 2 % shampoo Apply 1 application topically daily as needed (for skin fungus on neck/back).  . latanoprost (XALATAN) 0.005 % ophthalmic solution Place 1 drop into both eyes at bedtime.   Marland Kitchen levocetirizine (XYZAL) 5 MG tablet TAKE 1 TABLET(5 MG) BY MOUTH EVERY EVENING  . Multiple Vitamins-Minerals (ONE-A-DAY WOMENS) tablet Take by mouth.  . naproxen (NAPROSYN) 500 MG tablet In AM and PM  . oxyCODONE-acetaminophen (PERCOCET/ROXICET) 5-325 MG tablet Take 1  tablet by mouth daily.  . pantoprazole (PROTONIX) 40 MG tablet Take 1 tablet by mouth daily.  . potassium chloride (MICRO-K) 10 MEQ CR capsule TAKE 1 CAPSULE(10 MEQ) BY MOUTH TWICE DAILY  . rizatriptan (MAXALT-MLT) 10 MG disintegrating tablet DISSOLVE 1 TABLET BY MOUTH AS NEEDED FOR MIGRAINE- MAY REPEAT IN 2 HOURS AS NEEDED  . Suvorexant (BELSOMRA) 20 MG TABS Take 20 mg by mouth at bedtime as needed.  . Syringe/Needle, Disp, (SYRINGE 3CC/25GX1-1/2") 25G X 1-1/2" 3 ML MISC 1 each by Does not apply route every 30 (thirty) days.  Marland Kitchen triamterene-hydrochlorothiazide (MAXZIDE) 75-50 MG tablet Take 1 tablet by mouth daily.   Current Facility-Administered Medications for the 07/24/20 encounter (Office Visit) with Glean Hess, MD  Medication  . cyanocobalamin ((VITAMIN B-12)) injection 1,000 mcg    PHQ 2/9 Scores 07/24/2020 07/11/2020 06/10/2020 04/18/2020  PHQ - 2 Score 0 0 0 0  PHQ- 9 Score 0 0 - 0  Exception Documentation - - - -    GAD 7 : Generalized Anxiety Score 07/24/2020 04/18/2020 01/16/2020  Nervous, Anxious, on Edge 0 0 0  Control/stop worrying 0 0 0  Worry too much - different things 0 0 0  Trouble relaxing 0 0 0  Restless 0 0 0  Easily annoyed or irritable 0 0 0  Afraid - awful might happen 0 0 0  Total GAD 7 Score 0 0 0  Anxiety Difficulty Not difficult at all Not difficult at all Not difficult at all    BP Readings from Last 3 Encounters:  07/24/20 124/68  07/11/20 (!) 138/91  04/18/20 118/78    Physical Exam Constitutional:      Appearance: She is obese.  Cardiovascular:     Rate and Rhythm: Normal rate and regular rhythm.     Pulses: Normal pulses.     Heart sounds: No murmur heard.   Pulmonary:     Effort: Pulmonary effort is normal.     Breath sounds: Normal breath sounds. No wheezing or rhonchi.  Musculoskeletal:     Cervical back: Normal range of motion.  Lymphadenopathy:     Cervical: No cervical adenopathy.  Skin:    General: Skin is warm.      Findings: Erythema (flat pink area on dorsum of each hand) present.  Neurological:     General: No focal deficit present.     Mental Status: She is alert and oriented to person, place, and time.     Wt Readings from Last 3 Encounters:  07/24/20 (!) 369 lb (167.4 kg)  07/11/20 (!) 366 lb (166 kg)  04/18/20 (!) 366 lb (166 kg)    BP 124/68   Pulse 96   Temp 97.9 F (36.6 C) (  Oral)   Ht 5\' 7"  (1.702 m)   Wt (!) 369 lb (167.4 kg)   SpO2 100%   BMI 57.79 kg/m   Assessment and Plan: 1. Prediabetes Not doing well with diet Currently trying to get a gastric revision approved - Hemoglobin A1c - Comprehensive metabolic panel  2. Migraine without status migrainosus, not intractable, unspecified migraine type Chronic headaches that may be due to rebound from daily use of Aleve Discontinue maxalt if no benefit Begin topamax 50 mg as bedtime and refer to Neurology - topiramate (TOPAMAX) 50 MG tablet; Take 1 tablet (50 mg total) by mouth at bedtime.  Dispense: 30 tablet; Refill: 1 - Ambulatory referral to Neurology  3. Rash and nonspecific skin eruption - triamcinolone ointment (KENALOG) 0.5 %; Apply 1 application topically 2 (two) times daily. To rash on hands  Dispense: 30 g; Refill: 0  4. Need for immunization against influenza - Flu Vaccine QUAD 36+ mos IM   Partially dictated using Editor, commissioning. Any errors are unintentional.  Halina Maidens, MD [facility]  07/24/2020

## 2020-07-25 LAB — COMPREHENSIVE METABOLIC PANEL
ALT: 10 IU/L (ref 0–32)
AST: 14 IU/L (ref 0–40)
Albumin/Globulin Ratio: 1.5 (ref 1.2–2.2)
Albumin: 4 g/dL (ref 3.8–4.9)
Alkaline Phosphatase: 88 IU/L (ref 44–121)
BUN/Creatinine Ratio: 9 (ref 9–23)
BUN: 7 mg/dL (ref 6–24)
Bilirubin Total: 0.2 mg/dL (ref 0.0–1.2)
CO2: 27 mmol/L (ref 20–29)
Calcium: 9.9 mg/dL (ref 8.7–10.2)
Chloride: 108 mmol/L — ABNORMAL HIGH (ref 96–106)
Creatinine, Ser: 0.79 mg/dL (ref 0.57–1.00)
GFR calc Af Amer: 100 mL/min/{1.73_m2} (ref >59)
GFR calc non Af Amer: 86 mL/min/{1.73_m2} (ref >59)
Globulin, Total: 2.7 g/dL (ref 1.5–4.5)
Glucose: 85 mg/dL (ref 65–99)
Potassium: 4 mmol/L (ref 3.5–5.2)
Sodium: 145 mmol/L — ABNORMAL HIGH (ref 134–144)
Total Protein: 6.7 g/dL (ref 6.0–8.5)

## 2020-07-25 LAB — HEMOGLOBIN A1C
Est. average glucose Bld gHb Est-mCnc: 134 mg/dL
Hgb A1c MFr Bld: 6.3 % — ABNORMAL HIGH (ref 4.8–5.6)

## 2020-07-29 HISTORY — PX: BARIATRIC SURGERY: SHX1103

## 2020-09-24 ENCOUNTER — Other Ambulatory Visit: Payer: Self-pay | Admitting: Internal Medicine

## 2020-10-07 DIAGNOSIS — Z79899 Other long term (current) drug therapy: Secondary | ICD-10-CM | POA: Diagnosis not present

## 2020-10-07 DIAGNOSIS — F329 Major depressive disorder, single episode, unspecified: Secondary | ICD-10-CM | POA: Diagnosis not present

## 2020-10-07 DIAGNOSIS — M461 Sacroiliitis, not elsewhere classified: Secondary | ICD-10-CM | POA: Diagnosis not present

## 2020-10-07 DIAGNOSIS — F1120 Opioid dependence, uncomplicated: Secondary | ICD-10-CM | POA: Diagnosis not present

## 2020-10-07 DIAGNOSIS — M5416 Radiculopathy, lumbar region: Secondary | ICD-10-CM | POA: Diagnosis not present

## 2020-10-07 DIAGNOSIS — M961 Postlaminectomy syndrome, not elsewhere classified: Secondary | ICD-10-CM | POA: Diagnosis not present

## 2020-10-07 DIAGNOSIS — F419 Anxiety disorder, unspecified: Secondary | ICD-10-CM | POA: Diagnosis not present

## 2020-10-07 DIAGNOSIS — G4733 Obstructive sleep apnea (adult) (pediatric): Secondary | ICD-10-CM | POA: Diagnosis not present

## 2020-10-07 DIAGNOSIS — I10 Essential (primary) hypertension: Secondary | ICD-10-CM | POA: Diagnosis not present

## 2020-10-09 DIAGNOSIS — H53149 Visual discomfort, unspecified: Secondary | ICD-10-CM | POA: Diagnosis not present

## 2020-10-09 DIAGNOSIS — R519 Headache, unspecified: Secondary | ICD-10-CM | POA: Insufficient documentation

## 2020-10-09 DIAGNOSIS — H538 Other visual disturbances: Secondary | ICD-10-CM | POA: Diagnosis not present

## 2020-10-18 DIAGNOSIS — G4733 Obstructive sleep apnea (adult) (pediatric): Secondary | ICD-10-CM | POA: Diagnosis not present

## 2020-10-21 ENCOUNTER — Other Ambulatory Visit: Payer: Self-pay | Admitting: Internal Medicine

## 2020-10-21 DIAGNOSIS — G43909 Migraine, unspecified, not intractable, without status migrainosus: Secondary | ICD-10-CM

## 2020-10-21 NOTE — Telephone Encounter (Signed)
Requested medication (s) are due for refill today:yes  Requested medication (s) are on the active medication list: yes   refill:  09/24/2020  Future visit scheduled: yes  Notes to clinic:  this refill cannot be delegated  Requesting 90 day Requested Prescriptions  Pending Prescriptions Disp Refills   topiramate (TOPAMAX) 50 MG tablet [Pharmacy Med Name: TOPIRAMATE 50MG  TABLETS] 90 tablet     Sig: TAKE 1 TABLET(50 MG) BY MOUTH AT BEDTIME      Not Delegated - Neurology: Anticonvulsants - topiramate & zonisamide Failed - 10/21/2020  6:27 AM      Failed - This refill cannot be delegated      Passed - Cr in normal range and within 360 days    Creatinine  Date Value Ref Range Status  10/31/2014 0.69 0.60 - 1.30 mg/dL Final   Creatinine, Ser  Date Value Ref Range Status  07/24/2020 0.79 0.57 - 1.00 mg/dL Final          Passed - CO2 in normal range and within 360 days    CO2  Date Value Ref Range Status  07/24/2020 27 20 - 29 mmol/L Final   Co2  Date Value Ref Range Status  10/31/2014 26 21 - 32 mmol/L Final          Passed - Valid encounter within last 12 months    Recent Outpatient Visits           2 months ago Prediabetes   [place], Laura H, MD   3 months ago Acute non-recurrent maxillary sinusitis   [facility] Glean Hess, MD   6 months ago Primary insomnia   [facility] Glean Hess, MD   9 months ago Annual physical exam   [facility] Glean Hess, MD   10 months ago Primary insomnia   [facility] Glean Hess, MD       Future Appointments             In 1 month Army Melia Jesse Sans, MD [facility], [place]   In 2 months Army Melia, Jesse Sans, MD [facility], [facility]

## 2020-10-30 ENCOUNTER — Other Ambulatory Visit: Payer: Self-pay | Admitting: Internal Medicine

## 2020-10-30 DIAGNOSIS — G43909 Migraine, unspecified, not intractable, without status migrainosus: Secondary | ICD-10-CM

## 2020-10-30 NOTE — Telephone Encounter (Signed)
   Notes to clinic:  Looks like this medication was refused  Note states that patient was given referral     Requested Prescriptions  Pending Prescriptions Disp Refills   topiramate (TOPAMAX) 50 MG tablet [Pharmacy Med Name: TOPIRAMATE 50MG  TABLETS] 90 tablet     Sig: TAKE 1 TABLET(50 MG) BY MOUTH AT BEDTIME      Not Delegated - Neurology: Anticonvulsants - topiramate & zonisamide Failed - 10/30/2020  9:26 AM      Failed - This refill cannot be delegated      Passed - Cr in normal range and within 360 days    Creatinine  Date Value Ref Range Status  10/31/2014 0.69 0.60 - 1.30 mg/dL Final   Creatinine, Ser  Date Value Ref Range Status  07/24/2020 0.79 0.57 - 1.00 mg/dL Final          Passed - CO2 in normal range and within 360 days    CO2  Date Value Ref Range Status  07/24/2020 27 20 - 29 mmol/L Final   Co2  Date Value Ref Range Status  10/31/2014 26 21 - 32 mmol/L Final          Passed - Valid encounter within last 12 months    Recent Outpatient Visits           3 months ago Prediabetes   [place], MD   3 months ago Acute non-recurrent maxillary sinusitis   [facility] Glean Hess, MD   6 months ago Primary insomnia   [facility] Glean Hess, MD   9 months ago Annual physical exam   [facility] Glean Hess, MD   10 months ago Primary insomnia   [facility] Glean Hess, MD       Future Appointments             In 3 weeks Army Melia Jesse Sans, MD [facility], [place]   In 2 months Army Melia, Jesse Sans, MD [facility], [facility]

## 2020-11-01 ENCOUNTER — Other Ambulatory Visit: Payer: Self-pay | Admitting: Internal Medicine

## 2020-11-01 DIAGNOSIS — R600 Localized edema: Secondary | ICD-10-CM

## 2020-11-05 DIAGNOSIS — Z90710 Acquired absence of both cervix and uterus: Secondary | ICD-10-CM | POA: Insufficient documentation

## 2020-11-06 DIAGNOSIS — H538 Other visual disturbances: Secondary | ICD-10-CM | POA: Diagnosis not present

## 2020-11-06 DIAGNOSIS — R519 Headache, unspecified: Secondary | ICD-10-CM | POA: Diagnosis not present

## 2020-11-06 DIAGNOSIS — H53149 Visual discomfort, unspecified: Secondary | ICD-10-CM | POA: Diagnosis not present

## 2020-11-21 ENCOUNTER — Other Ambulatory Visit: Payer: Self-pay | Admitting: Internal Medicine

## 2020-11-21 DIAGNOSIS — F5101 Primary insomnia: Secondary | ICD-10-CM

## 2020-11-21 NOTE — Telephone Encounter (Signed)
Please review. [place] office visit 07/24/2020.  KP

## 2020-11-21 NOTE — Telephone Encounter (Signed)
Requested medication (s) are due for refill today: yes  Requested medication (s) are on the active medication list: yes  Last refill: 10/17/2020  Future visit scheduled: yes  Notes to clinic: Medication not assigned to a protocol, review manually   Requested Prescriptions  Pending Prescriptions Disp Refills   BELSOMRA 20 MG TABS [Pharmacy Med Name: BELSOMRA 20MG  TABLETS] 30 tablet     Sig: TAKE 1 TABLET BY MOUTH AT BEDTIME AS NEEDED      Off-Protocol Failed - 11/21/2020  9:05 AM      Failed - Medication not assigned to a protocol, review manually.      Passed - Valid encounter within last 12 months    Recent Outpatient Visits           4 months ago Prediabetes   [facility] Glean Hess, MD   4 months ago Acute non-recurrent maxillary sinusitis   [facility] Glean Hess, MD   7 months ago Primary insomnia   [facility] Glean Hess, MD   10 months ago Annual physical exam   [facility] Glean Hess, MD   11 months ago Primary insomnia   [facility] Glean Hess, MD       Future Appointments             In 4 days Glean Hess, MD [facility], [place]   In 1 month Army Melia, Jesse Sans, MD [facility], [facility]

## 2020-11-25 ENCOUNTER — Encounter: Payer: Self-pay | Admitting: Internal Medicine

## 2020-11-25 ENCOUNTER — Other Ambulatory Visit: Payer: Self-pay

## 2020-11-25 ENCOUNTER — Ambulatory Visit (INDEPENDENT_AMBULATORY_CARE_PROVIDER_SITE_OTHER): Payer: BC Managed Care – PPO | Admitting: Internal Medicine

## 2020-11-25 VITALS — BP 124/78 | HR 90 | Temp 97.7°F | Ht 67.0 in | Wt 316.0 lb

## 2020-11-25 DIAGNOSIS — R7303 Prediabetes: Secondary | ICD-10-CM | POA: Diagnosis not present

## 2020-11-25 DIAGNOSIS — R600 Localized edema: Secondary | ICD-10-CM

## 2020-11-25 DIAGNOSIS — G43909 Migraine, unspecified, not intractable, without status migrainosus: Secondary | ICD-10-CM | POA: Diagnosis not present

## 2020-11-25 MED ORDER — RIZATRIPTAN BENZOATE 10 MG PO TBDP: ORAL_TABLET | ORAL | 0 refills | Status: DC

## 2020-11-25 NOTE — Progress Notes (Signed)
Date:  11/25/2020   Name:  Kimberly Cowan   DOB:  Jul 05, 1966   MRN:  [id number]   Chief Complaint: Diabetes  Diabetes She presents for her follow-up diabetic visit. Diabetes type: prediabetes. Pertinent negatives for hypoglycemia include no dizziness or headaches. Pertinent negatives for diabetes include no chest pain, no fatigue and no weakness. Symptoms are stable.   Peripheral edema - on Lasix for control previously but she has not needed to take it.  Obesity - she had gastric bypass revision in November and has lost over 50 lbs.  She feels well.  She does have some gerd that is managed by the surgeon with PPI and carafate.   Lab Results  Component Value Date   CREATININE 0.79 07/24/2020   BUN 7 07/24/2020   NA 145 (H) 07/24/2020   K 4.0 07/24/2020   CL 108 (H) 07/24/2020   CO2 27 07/24/2020   Lab Results  Component Value Date   CHOL 138 01/16/2020   HDL 65 01/16/2020   LDLCALC 60 01/16/2020   TRIG 61 01/16/2020   CHOLHDL 2.1 01/16/2020   Lab Results  Component Value Date   TSH 2.150 01/16/2020   Lab Results  Component Value Date   HGBA1C 6.3 (H) 07/24/2020   Lab Results  Component Value Date   WBC 4.7 01/16/2020   HGB 12.3 01/16/2020   HCT 38.7 01/16/2020   MCV 87 01/16/2020   PLT 301 01/16/2020   Lab Results  Component Value Date   ALT 10 07/24/2020   AST 14 07/24/2020   ALKPHOS 88 07/24/2020   BILITOT 0.2 07/24/2020     Review of Systems  Constitutional: Negative for fatigue and unexpected weight change.  HENT: Negative for nosebleeds.   Eyes: Negative for visual disturbance.  Respiratory: Negative for cough, chest tightness, shortness of breath and wheezing.   Cardiovascular: Negative for chest pain, palpitations and leg swelling.  Gastrointestinal: Negative for abdominal pain, constipation and diarrhea.       Gerd   Neurological: Negative for dizziness, weakness, light-headedness and headaches.    Patient Active Problem  List   Diagnosis Date Noted  . Status post hysterectomy 11/05/2020  . Primary insomnia 12/28/2019  . Mild peripheral edema 11/15/2017  . Perforated ear drum, left 06/25/2017  . History of pulmonary embolism 06/10/2017  . Environmental and seasonal allergies 04/07/2017  . Tinea versicolor 02/03/2017  . Vasomotor symptoms due to menopause 11/11/2016  . Morbid obesity ([place]) 09/10/2016  . B12 nutritional deficiency 08/05/2016  . Obstructive sleep apnea of adult 03/04/2016  . Bilateral lumbar radiculopathy 08/14/2015  . Status post gastric bypass for obesity 07/04/2015  . Prediabetes 07/01/2015  . Headache, migraine 04/04/2012    Allergies  Allergen Reactions  . Morphine And Related Hives, Shortness Of Breath, Nausea And Vomiting and Other (See Comments)    "can't breathe"  . Penicillins Anaphylaxis, Hives, Shortness Of Breath and Nausea And Vomiting    "can't breathe", Has patient had a PCN reaction causing immediate rash, facial/tongue/throat swelling, SOB or lightheadedness with hypotension: Yes Has patient had a PCN reaction causing severe rash involving mucus membranes or skin necrosis: No Has patient had a PCN reaction that required hospitalization No Has patient had a PCN reaction occurring within the last 10 years: No If all of the above answers are "NO", then may proceed with Cephalosporin use.   . Latex Hives  . Baclofen Nausea Only    Jittery, anxious    Past Surgical History:  Procedure Laterality Date  . ABDOMINAL HYSTERECTOMY  10/2006  . BACK SURGERY  2014   lumbar fusion  . BACK SURGERY  2009   discectomy  . [place]  . COLONOSCOPY WITH PROPOFOL N/A 04/12/2018   Procedure: COLONOSCOPY WITH PROPOFOL;  Surgeon: Lin Landsman, MD;  Location: [facility] ENDOSCOPY;  Service: Gastroenterology;  Laterality: N/A;  . GASTRIC BYPASS  2005  . KNEE ARTHROSCOPY WITH LATERAL MENISECTOMY Left 05/27/2017   Procedure: KNEE ARTHROSCOPY WITH LATERAL MENISECTOMY;   Surgeon: Hessie Knows, MD;  Location: [facility] ORS;  Service: Orthopedics;  Laterality: Left;  . KNEE ARTHROSCOPY WITH LATERAL RELEASE Left 05/27/2017   Procedure: KNEE ARTHROSCOPY WITH LATERAL RELEASE;  Surgeon: Hessie Knows, MD;  Location: [facility] ORS;  Service: Orthopedics;  Laterality: Left;  . LAPAROSCOPIC SALPINGO OOPHERECTOMY Bilateral 10/05/2016   Procedure: LAPAROSCOPIC SALPINGO OOPHORECTOMY;  Surgeon: Brayton Mars, MD;  Location: [facility] ORS;  Service: Gynecology;  Laterality: Bilateral;  . REDUCTION MAMMAPLASTY Bilateral    2001  . SPINAL CORD STIMULATOR INSERTION N/A 03/20/2016   Procedure: LUMBAR SPINAL CORD STIMULATOR INSERTION;  Surgeon: Eustace Moore, MD;  Location: [place] [facility]URO ORS;  Service: Neurosurgery;  Laterality: N/A;  . SPINE SURGERY  2009 2014 2017  . TONSILLECTOMY      Social History   Tobacco Use  . Smoking status: Never Smoker  . Smokeless tobacco: Never Used  . Tobacco comment: smoking cessation materials not required  Vaping Use  . Vaping Use: Never used  Substance Use Topics  . Alcohol use: No    Alcohol/week: 0.0 standard drinks  . Drug use: No     Medication list has been reviewed and updated.  Current Meds  Medication Sig  . B Complex Vitamins (VITAMIN B-COMPLEX) TABS Take 1 tablet by mouth daily.  . B-D 3CC LUER-LOK SYR 25GX1" 25G X 1" 3 ML MISC   . BELSOMRA 20 MG TABS TAKE 1 TABLET BY MOUTH AT BEDTIME AS NEEDED  . calcium carbonate (OS-CAL) 600 MG tablet Take by mouth. Take 2 tablets by mouth in the morning and 1 tablet at night  . cyanocobalamin (,VITAMIN B-12,) 1000 MCG/ML injection Inject 1 mL (1,000 mcg total) into the muscle every 30 (thirty) days.  . cyclobenzaprine (FLEXERIL) 10 MG tablet Take 1 tablet by mouth daily. Pt takes one tablet qhs  . fluticasone (FLONASE) 50 MCG/ACT nasal spray SHAKE LIQUID AND USE 2 SPRAYS IN EACH NOSTRIL DAILY  . furosemide (LASIX) 20 MG tablet TAKE 1 TABLET(20 MG) BY MOUTH TWICE DAILY  . gabapentin (NEURONTIN)  300 MG capsule Take 600 mg by mouth at bedtime.   Marland Kitchen ketoconazole (NIZORAL) 2 % shampoo Apply 1 application topically daily as needed (for skin fungus on neck/back).  . latanoprost (XALATAN) 0.005 % ophthalmic solution Place 1 drop into both eyes at bedtime.   Marland Kitchen levocetirizine (XYZAL) 5 MG tablet TAKE 1 TABLET(5 MG) BY MOUTH EVERY EVENING  . Multiple Vitamins-Minerals (ONE-A-DAY WOMENS) tablet Take by mouth.  . nortriptyline (PAMELOR) 10 MG capsule Take 30 mg at night for two weeks then increase to 40 mg at night and continue that dose 120  . ondansetron (ZOFRAN-ODT) 4 MG disintegrating tablet   . oxyCODONE-acetaminophen (PERCOCET/ROXICET) 5-325 MG tablet Take 1 tablet by mouth daily.  . pantoprazole (PROTONIX) 40 MG tablet Take 1 tablet by mouth daily.  . potassium chloride (MICRO-K) 10 MEQ CR capsule TAKE 1 CAPSULE(10 MEQ) BY MOUTH TWICE DAILY  . rizatriptan (MAXALT-MLT) 10 MG disintegrating tablet DISSOLVE  1 TABLET BY MOUTH AS NEEDED FOR MIGRAINE- MAY REPEAT IN 2 HOURS AS NEEDED  . sucralfate (CARAFATE) 1 GM/10ML suspension Take by mouth.  . Syringe/Needle, Disp, (SYRINGE 3CC/25GX1-1/2") 25G X 1-1/2" 3 ML MISC 1 each by Does not apply route every 30 (thirty) days.  Marland Kitchen triamcinolone ointment (KENALOG) 0.5 % Apply 1 application topically 2 (two) times daily. To rash on hands   Current Facility-Administered Medications for the 11/25/20 encounter (Office Visit) with Glean Hess, MD  Medication  . cyanocobalamin ((VITAMIN B-12)) injection 1,000 mcg    PHQ 2/9 Scores 11/25/2020 07/24/2020 07/11/2020 06/10/2020  PHQ - 2 Score 0 0 0 0  PHQ- 9 Score 0 0 0 -  Exception Documentation - - - -    GAD 7 : Generalized Anxiety Score 11/25/2020 07/24/2020 04/18/2020 01/16/2020  Nervous, Anxious, on Edge 0 0 0 0  Control/stop worrying 0 0 0 0  Worry too much - different things 0 0 0 0  Trouble relaxing 0 0 0 0  Restless 0 0 0 0  Easily annoyed or irritable 0 0 0 0  Afraid - awful might happen 0 0 0 0   Total GAD 7 Score 0 0 0 0  Anxiety Difficulty - Not difficult at all Not difficult at all Not difficult at all    BP Readings from Last 3 Encounters:  11/25/20 124/78  07/24/20 124/68  07/11/20 (!) 138/91    Physical Exam Vitals and nursing note reviewed.  Constitutional:      General: She is not in acute distress.    Appearance: She is well-developed.  HENT:     Head: Normocephalic and atraumatic.  Cardiovascular:     Rate and Rhythm: Normal rate and regular rhythm.  Pulmonary:     Effort: Pulmonary effort is normal. No respiratory distress.     Breath sounds: No wheezing or rhonchi.  Musculoskeletal:     Cervical back: Normal range of motion.     Right lower leg: No edema.     Left lower leg: No edema.  Lymphadenopathy:     Cervical: No cervical adenopathy.  Skin:    General: Skin is warm and dry.     Findings: No rash.  Neurological:     General: No focal deficit present.     Mental Status: She is alert and oriented to person, place, and time.  Psychiatric:        Mood and Affect: Mood normal.        Behavior: Behavior normal.     Wt Readings from Last 3 Encounters:  11/25/20 (!) 316 lb (143.3 kg)  07/24/20 (!) 369 lb (167.4 kg)  07/11/20 (!) 366 lb (166 kg)    BP 124/78   Pulse 90   Temp 97.7 F (36.5 C) (Oral)   Ht 5\' 7"  (1.702 m)   Wt (!) 316 lb (143.3 kg)   SpO2 98%   BMI 49.49 kg/m   Assessment and Plan: 1. Prediabetes Expect improvement with weight loss - Hemoglobin A1c - Comprehensive metabolic panel  2. Mild peripheral edema Mostly resolved - continue low sodium diet  3. Migraine without status migrainosus, not intractable, unspecified migraine type Seen by neurology - dx'd with tension headaches and started on nortriptyline however still has occasional one sided headaches and would like some maxalt to use PRN - rizatriptan (MAXALT-MLT) 10 MG disintegrating tablet; DISSOLVE 1 TABLET BY MOUTH AS NEEDED FOR MIGRAINE- MAY REPEAT IN 2 HOURS  AS NEEDED  Dispense: 10  tablet; Refill: 0   Partially dictated using Editor, commissioning. Any errors are unintentional.  Halina Maidens, MD [facility]  11/25/2020

## 2020-11-26 LAB — COMPREHENSIVE METABOLIC PANEL
ALT: 27 IU/L (ref 0–32)
AST: 27 IU/L (ref 0–40)
Albumin/Globulin Ratio: 1.2 (ref 1.2–2.2)
Albumin: 3.7 g/dL — ABNORMAL LOW (ref 3.8–4.9)
Alkaline Phosphatase: 97 IU/L (ref 44–121)
BUN/Creatinine Ratio: 7 — ABNORMAL LOW (ref 9–23)
BUN: 5 mg/dL — ABNORMAL LOW (ref 6–24)
Bilirubin Total: 0.5 mg/dL (ref 0.0–1.2)
CO2: 25 mmol/L (ref 20–29)
Calcium: 9.6 mg/dL (ref 8.7–10.2)
Chloride: 104 mmol/L (ref 96–106)
Creatinine, Ser: 0.67 mg/dL (ref 0.57–1.00)
Globulin, Total: 3 g/dL (ref 1.5–4.5)
Glucose: 89 mg/dL (ref 65–99)
Potassium: 3.6 mmol/L (ref 3.5–5.2)
Sodium: 144 mmol/L (ref 134–144)
Total Protein: 6.7 g/dL (ref 6.0–8.5)
eGFR: 105 mL/min/{1.73_m2} (ref >59)

## 2020-11-26 LAB — HEMOGLOBIN A1C
Est. average glucose Bld gHb Est-mCnc: 128 mg/dL
Hgb A1c MFr Bld: 6.1 % — ABNORMAL HIGH (ref 4.8–5.6)

## 2020-11-27 ENCOUNTER — Encounter: Payer: Self-pay | Admitting: Internal Medicine

## 2020-12-30 ENCOUNTER — Other Ambulatory Visit: Payer: Self-pay | Admitting: Internal Medicine

## 2020-12-30 DIAGNOSIS — F5101 Primary insomnia: Secondary | ICD-10-CM

## 2020-12-30 NOTE — Telephone Encounter (Signed)
Requested medication (s) are due for refill today: yes  Requested medication (s) are on the active medication list: yes  Last refill:  11/21/20 #30 0 refills  Future visit scheduled: yes in 2 weeks  Notes to clinic:  medication not assigned to a prorotcol     Requested Prescriptions  Pending Prescriptions Disp Refills   [place] 20 MG TABS [Pharmacy Med Name: BELSOMRA 20MG  TABLETS] 30 tablet     Sig: TAKE 1 TABLET BY MOUTH AT BEDTIME AS NEEDED      Off-Protocol Failed - 12/30/2020  4:59 PM      Failed - Medication not assigned to a protocol, review manually.      Passed - Valid encounter within last 12 months    Recent Outpatient Visits           1 month ago Prediabetes   [facility] Glean Hess, MD   5 months ago Prediabetes   [facility] Glean Hess, MD   5 months ago Acute non-recurrent maxillary sinusitis   [facility] Glean Hess, MD   8 months ago Primary insomnia   [facility] Glean Hess, MD   11 months ago Annual physical exam   [facility] Glean Hess, MD       Future Appointments             In 2 weeks Army Melia Jesse Sans, MD [facility], [facility]

## 2020-12-31 DIAGNOSIS — Z79899 Other long term (current) drug therapy: Secondary | ICD-10-CM | POA: Diagnosis not present

## 2020-12-31 DIAGNOSIS — M961 Postlaminectomy syndrome, not elsewhere classified: Secondary | ICD-10-CM | POA: Diagnosis not present

## 2020-12-31 DIAGNOSIS — F419 Anxiety disorder, unspecified: Secondary | ICD-10-CM | POA: Diagnosis not present

## 2020-12-31 DIAGNOSIS — M5416 Radiculopathy, lumbar region: Secondary | ICD-10-CM | POA: Diagnosis not present

## 2020-12-31 DIAGNOSIS — F329 Major depressive disorder, single episode, unspecified: Secondary | ICD-10-CM | POA: Diagnosis not present

## 2020-12-31 DIAGNOSIS — F1120 Opioid dependence, uncomplicated: Secondary | ICD-10-CM | POA: Diagnosis not present

## 2020-12-31 NOTE — Telephone Encounter (Signed)
Please review. Last office visit 11/25/2020.  KP

## 2021-01-16 ENCOUNTER — Ambulatory Visit
Admission: EM | Admit: 2021-01-16 | Discharge: 2021-01-16 | Disposition: A | Discharge status: Home / Self Care | Payer: Medicare HMO

## 2021-01-16 ENCOUNTER — Other Ambulatory Visit: Payer: Self-pay

## 2021-01-16 ENCOUNTER — Encounter: Payer: Self-pay | Admitting: Emergency Medicine

## 2021-01-16 DIAGNOSIS — G4733 Obstructive sleep apnea (adult) (pediatric): Secondary | ICD-10-CM | POA: Diagnosis not present

## 2021-01-16 DIAGNOSIS — K645 Perianal venous thrombosis: Secondary | ICD-10-CM | POA: Diagnosis not present

## 2021-01-16 NOTE — Discharge Instructions (Signed)
Do sits baths for 15 minutes three times a day today and tomorrow, then 1-2 times a day for 5 more days. The bleeding should stop in 48 hours, but if it gets worse, go to ER.

## 2021-01-16 NOTE — ED Provider Notes (Signed)
[facility]    CSN: [id number] Arrival date & time: 01/16/21  1310      History   Chief Complaint Chief Complaint  Patient presents with  . Hemorrhoids    HPI Kimberly Cowan is a 55 y.o. female who presents with onset of hemorrhoids one week ago. Has been using OTC meds for hemorrhoids and started bleeding this am.     Past Medical History:  Diagnosis Date  . Allergy 1999   SEASONAL  . Anemia   . Anxiety   . Arm pain, left 05/17/2018  . Arthritis    lower spine  . Bilateral lumbar radiculopathy   . Chronic back pain   . Clinical depression 04/04/2012  . Depression   . DVT (deep venous thrombosis) ([place])   . Encounter for screening colonoscopy   . Glaucoma    RIGHT EYE  . Headache(784.0)    migraines  . Major depressive disorder with single episode, in full remission ([place]) 08/15/2018  . Ovarian cyst   . Pneumonia    walking pneumonia  . Sleep apnea    no cpcap     last sleep study > 4 yrs    Patient Active Problem List   Diagnosis Date Noted  . Status post hysterectomy 11/05/2020  . Primary insomnia 12/28/2019  . Mild peripheral edema 11/15/2017  . Perforated ear drum, left 06/25/2017  . History of pulmonary embolism 06/10/2017  . Environmental and seasonal allergies 04/07/2017  . Tinea versicolor 02/03/2017  . Vasomotor symptoms due to menopause 11/11/2016  . Morbid obesity ([place]) 09/10/2016  . B12 nutritional deficiency 08/05/2016  . Obstructive sleep apnea of adult 03/04/2016  . Bilateral lumbar radiculopathy 08/14/2015  . Status post gastric bypass for obesity 07/04/2015  . Prediabetes 07/01/2015  . Headache, migraine 04/04/2012    Past Surgical History:  Procedure Laterality Date  . ABDOMINAL HYSTERECTOMY  10/2006  . BACK SURGERY  2014   lumbar fusion  . BACK SURGERY  2009   discectomy  . BARIATRIC SURGERY  07/2020   Revision  . [place]  . COLONOSCOPY WITH PROPOFOL N/A 04/12/2018   Procedure:  COLONOSCOPY WITH PROPOFOL;  Surgeon: Lin Landsman, MD;  Location: [facility] ENDOSCOPY;  Service: Gastroenterology;  Laterality: N/A;  . GASTRIC BYPASS  2005  . KNEE ARTHROSCOPY WITH LATERAL MENISECTOMY Left 05/27/2017   Procedure: KNEE ARTHROSCOPY WITH LATERAL MENISECTOMY;  Surgeon: Hessie Knows, MD;  Location: [facility] ORS;  Service: Orthopedics;  Laterality: Left;  . KNEE ARTHROSCOPY WITH LATERAL RELEASE Left 05/27/2017   Procedure: KNEE ARTHROSCOPY WITH LATERAL RELEASE;  Surgeon: Hessie Knows, MD;  Location: [facility] ORS;  Service: Orthopedics;  Laterality: Left;  . LAPAROSCOPIC SALPINGO OOPHERECTOMY Bilateral 10/05/2016   Procedure: LAPAROSCOPIC SALPINGO OOPHORECTOMY;  Surgeon: Brayton Mars, MD;  Location: [facility] ORS;  Service: Gynecology;  Laterality: Bilateral;  . REDUCTION MAMMAPLASTY Bilateral    2001  . SPINAL CORD STIMULATOR INSERTION N/A 03/20/2016   Procedure: LUMBAR SPINAL CORD STIMULATOR INSERTION;  Surgeon: Eustace Moore, MD;  Location: [place] NEURO ORS;  Service: Neurosurgery;  Laterality: N/A;  . SPINE SURGERY  2009 2014 2017  . TONSILLECTOMY      OB History    Gravida  2   Para  2   Term  2   Preterm      AB      Living  2     SAB      IAB      Ectopic  Multiple      Live Births  2            Home Medications    Prior to Admission medications   Medication Sig Start Date End Date Taking? Authorizing Provider  B-D 3CC LUER-LOK SYR 25GX1" 25G X 1" 3 ML MISC  02/14/20  Yes [provider]  BELSOMRA 20 MG TABS TAKE 1 TABLET BY MOUTH AT BEDTIME AS NEEDED 12/31/20  Yes Glean Hess, MD  calcium carbonate (OS-CAL) 600 MG tablet Take by mouth. Take 2 tablets by mouth in the morning and 1 tablet at night   Yes [provider]  cyanocobalamin (,VITAMIN B-12,) 1000 MCG/ML injection Inject 1 mL (1,000 mcg total) into the muscle every 30 (thirty) days. 02/13/20  Yes Glean Hess, MD  cyclobenzaprine (FLEXERIL) 10 MG tablet Take 1 tablet  by mouth daily. Pt takes one tablet qhs 12/28/17  Yes [provider]  fluticasone (FLONASE) 50 MCG/ACT nasal spray SHAKE LIQUID AND USE 2 SPRAYS IN EACH NOSTRIL DAILY 09/24/20  Yes Glean Hess, MD  gabapentin (NEURONTIN) 300 MG capsule Take 600 mg by mouth at bedtime.  01/01/16  Yes [provider]  latanoprost (XALATAN) 0.005 % ophthalmic solution Place 1 drop into both eyes at bedtime.    Yes [provider]  levocetirizine (XYZAL) 5 MG tablet TAKE 1 TABLET(5 MG) BY MOUTH EVERY EVENING 04/09/20  Yes Glean Hess, MD  nortriptyline (PAMELOR) 10 MG capsule Take 30 mg at night for two weeks then increase to 40 mg at night and continue that dose 120 10/09/20  Yes [provider]  oxyCODONE-acetaminophen (PERCOCET/ROXICET) 5-325 MG tablet Take 1 tablet by mouth daily. 12/15/17  Yes [provider]  pantoprazole (PROTONIX) 40 MG tablet Take 1 tablet by mouth daily. 05/27/20  Yes [provider]  rizatriptan (MAXALT-MLT) 10 MG disintegrating tablet DISSOLVE 1 TABLET BY MOUTH AS NEEDED FOR MIGRAINE- MAY REPEAT IN 2 HOURS AS NEEDED 11/25/20  Yes Glean Hess, MD  sucralfate (CARAFATE) 1 GM/10ML suspension Take by mouth. 11/04/20  Yes [provider]  B Complex Vitamins (VITAMIN B-COMPLEX) TABS Take 1 tablet by mouth daily.    [provider]  ketoconazole (NIZORAL) 2 % shampoo Apply 1 application topically daily as needed (for skin fungus on neck/back). 11/15/17   Glean Hess, MD  Multiple Vitamins-Minerals (ONE-A-DAY WOMENS) tablet Take by mouth.    [provider]  ondansetron (ZOFRAN-ODT) 4 MG disintegrating tablet  08/01/20   [provider]  Syringe/Needle, Disp, (SYRINGE 3CC/25GX1-1/2") 25G X 1-1/2" 3 ML MISC 1 each by Does not apply route every 30 (thirty) days. 05/10/20 05/10/21  Glean Hess, MD  triamcinolone ointment (KENALOG) 0.5 % Apply 1 application topically 2 (two) times daily. To rash on  hands 07/24/20   Glean Hess, MD    Family History Family History  Problem Relation Age of Onset  . Diabetes Mother   . Hypertension Mother   . Obesity Mother   . Diabetes Sister   . Hypertension Sister   . Hypertension Father   . Obesity Daughter   . Obesity Sister   . Obesity Sister   . Breast cancer Neg Hx     Social History Social History   Tobacco Use  . Smoking status: Never Smoker  . Smokeless tobacco: Never Used  . Tobacco comment: smoking cessation materials not required  Vaping Use  . Vaping Use: Never used  Substance Use Topics  .  Alcohol use: No    Alcohol/week: 0.0 standard drinks  . Drug use: No     Allergies   Morphine and related, Penicillins, Latex, and Baclofen   Review of Systems Review of Systems  Constitutional: Negative for fever.  Gastrointestinal: Positive for anal bleeding and diarrhea. Negative for abdominal pain and constipation.  Skin: Negative for rash.     Physical Exam Triage Vital Signs ED Triage Vitals  Enc Vitals Group     BP 01/16/21 1337 (!) 155/105     Pulse Rate 01/16/21 1337 99     Resp 01/16/21 1337 18     Temp 01/16/21 1337 98.3 F (36.8 C)     Temp Source 01/16/21 1337 Oral     SpO2 01/16/21 1337 99 %     Weight 01/16/21 1334 (!) 315 lb 14.7 oz (143.3 kg)     Height 01/16/21 1334 5\' 7"  (1.702 m)     Head Circumference --      Peak Flow --      Pain Score 01/16/21 1334 6     Pain Loc --      Pain Edu? --      Excl. in Rule? --    No data found.  Updated Vital Signs BP (!) 155/105 (BP Location: Right Arm)   Pulse 99   Temp 98.3 F (36.8 C) (Oral)   Resp 18   Ht 5\' 7"  (1.702 m)   Wt (!) 315 lb 14.7 oz (143.3 kg)   SpO2 99%   BMI 49.48 kg/m   Visual Acuity Right Eye Distance:   Left Eye Distance:   Bilateral Distance:    Right Eye Near:   Left Eye Near:    Bilateral Near:     Physical Exam Vitals and nursing note reviewed.  Constitutional:      General: She is not in acute distress.     Appearance: She is obese. She is not toxic-appearing.  HENT:     Right Ear: External ear normal.     Left Ear: External ear normal.  Eyes:     Conjunctiva/sclera: Conjunctivae normal.     Pupils: Pupils are equal, round, and reactive to light.  Pulmonary:     Effort: Pulmonary effort is normal.  Genitourinary:    Comments: ANAL- has large thrombosed hemorrhoid on her L.  Musculoskeletal:        General: Normal range of motion.     Cervical back: Neck supple.  Skin:    General: Skin is warm and dry.     Findings: No rash.  Neurological:     Mental Status: She is alert and oriented to person, place, and time.     Gait: Gait normal.  Psychiatric:        Mood and Affect: Mood normal.        Behavior: Behavior normal.        Thought Content: Thought content normal.        Judgment: Judgment normal.      UC Treatments / Results  Labs (all labs ordered are listed, but only abnormal results are displayed) Labs Reviewed - No data to display  EKG   Radiology No results found.  Procedures Procedures (including critical care time) Area was numbed wtih 1% lidocaine with epi, 2 ml injected. Skin cleansed with betadine and sterile # 11 knife used to make 1 cm incision. With a hemostat I removed several clots. Pt tolerated this well. 4x4 folded in two applied over it.  Medications Ordered in UC Medications - No data to display  Initial Impression / Assessment and Plan / UC Course  I have reviewed the triage vital signs and the nursing notes. Advised to do sits baths tid today and tomorrow, and 1-2 times a day for 5 more days.  May continue the hemorrhoid cream she has been using to apply externally.   Final Clinical Impressions(s) / UC Diagnoses   Final diagnoses:  None   Discharge Instructions   None    ED Prescriptions    None     PDMP not reviewed this encounter.   Shelby Mattocks, PA-C 01/17/21 1402

## 2021-01-16 NOTE — ED Triage Notes (Signed)
Pt c/o hemorrhoids. Started about a week ago. She has tried multiple OTC medications without relief. She states they started bleeding this morning.

## 2021-01-17 ENCOUNTER — Encounter: Payer: Medicare HMO | Admitting: Internal Medicine

## 2021-01-18 ENCOUNTER — Other Ambulatory Visit: Payer: Self-pay | Admitting: Internal Medicine

## 2021-01-18 DIAGNOSIS — G43909 Migraine, unspecified, not intractable, without status migrainosus: Secondary | ICD-10-CM

## 2021-01-26 HISTORY — PX: CHOLECYSTECTOMY: SHX55

## 2021-01-29 DIAGNOSIS — E611 Iron deficiency: Secondary | ICD-10-CM | POA: Diagnosis not present

## 2021-01-29 DIAGNOSIS — G43909 Migraine, unspecified, not intractable, without status migrainosus: Secondary | ICD-10-CM | POA: Diagnosis not present

## 2021-01-29 DIAGNOSIS — R0602 Shortness of breath: Secondary | ICD-10-CM | POA: Diagnosis not present

## 2021-01-29 DIAGNOSIS — R1013 Epigastric pain: Secondary | ICD-10-CM | POA: Diagnosis not present

## 2021-01-29 DIAGNOSIS — H409 Unspecified glaucoma: Secondary | ICD-10-CM | POA: Diagnosis not present

## 2021-01-29 DIAGNOSIS — K219 Gastro-esophageal reflux disease without esophagitis: Secondary | ICD-10-CM | POA: Diagnosis not present

## 2021-01-29 DIAGNOSIS — G4733 Obstructive sleep apnea (adult) (pediatric): Secondary | ICD-10-CM | POA: Diagnosis not present

## 2021-01-29 DIAGNOSIS — K3189 Other diseases of stomach and duodenum: Secondary | ICD-10-CM | POA: Diagnosis not present

## 2021-02-03 ENCOUNTER — Other Ambulatory Visit: Payer: Self-pay | Admitting: Internal Medicine

## 2021-02-03 DIAGNOSIS — F5101 Primary insomnia: Secondary | ICD-10-CM

## 2021-02-03 MED ORDER — BELSOMRA 20 MG PO TABS: 1.0000 | ORAL_TABLET | Freq: Every evening | ORAL | 3 refills | Status: DC | PRN

## 2021-02-03 NOTE — Telephone Encounter (Signed)
Requested medication (s) are due for refill today: yes  Requested medication (s) are on the active medication list: yes  Last refill: 12/31/2020  Future visit scheduled: yes  Notes to clinic:   Medication not assigned to a protocol, review manually  Requested Prescriptions  Pending Prescriptions Disp Refills   BELSOMRA 20 MG TABS [Pharmacy Med Name: BELSOMRA 20MG  TABLETS] 30 tablet     Sig: TAKE 1 TABLET BY MOUTH AT BEDTIME AS NEEDED      Off-Protocol Failed - 02/03/2021  9:53 AM      Failed - Medication not assigned to a protocol, review manually.      Passed - Valid encounter within last 12 months    Recent Outpatient Visits           2 months ago Prediabetes   [facility] Glean Hess, MD   6 months ago Prediabetes   [facility] Glean Hess, MD   6 months ago Acute non-recurrent maxillary sinusitis   [facility] Glean Hess, MD   9 months ago Primary insomnia   [facility] Glean Hess, MD   1 year ago Annual physical exam   [facility] Glean Hess, MD       Future Appointments             In 2 months Army Melia Jesse Sans, MD [facility], [facility]

## 2021-02-07 DIAGNOSIS — Z9049 Acquired absence of other specified parts of digestive tract: Secondary | ICD-10-CM | POA: Insufficient documentation

## 2021-02-07 HISTORY — DX: Acquired absence of other specified parts of digestive tract: Z90.49

## 2021-02-13 DIAGNOSIS — K9413 Enterostomy malfunction: Secondary | ICD-10-CM

## 2021-02-13 DIAGNOSIS — E611 Iron deficiency: Secondary | ICD-10-CM | POA: Diagnosis not present

## 2021-02-13 DIAGNOSIS — E639 Nutritional deficiency, unspecified: Secondary | ICD-10-CM | POA: Diagnosis not present

## 2021-02-13 DIAGNOSIS — U071 COVID-19: Secondary | ICD-10-CM | POA: Diagnosis not present

## 2021-02-13 DIAGNOSIS — H409 Unspecified glaucoma: Secondary | ICD-10-CM | POA: Diagnosis not present

## 2021-02-13 DIAGNOSIS — M170 Bilateral primary osteoarthritis of knee: Secondary | ICD-10-CM | POA: Diagnosis not present

## 2021-02-13 DIAGNOSIS — G4733 Obstructive sleep apnea (adult) (pediatric): Secondary | ICD-10-CM | POA: Diagnosis not present

## 2021-02-13 DIAGNOSIS — K219 Gastro-esophageal reflux disease without esophagitis: Secondary | ICD-10-CM | POA: Diagnosis not present

## 2021-02-13 HISTORY — DX: Enterostomy malfunction: K94.13

## 2021-02-17 ENCOUNTER — Other Ambulatory Visit: Payer: Self-pay | Admitting: Internal Medicine

## 2021-02-17 DIAGNOSIS — E538 Deficiency of other specified B group vitamins: Secondary | ICD-10-CM

## 2021-02-17 NOTE — Telephone Encounter (Signed)
Requested medication (s) are due for refill today:   Yes  Requested medication (s) are on the active medication list:   Yes  Future visit scheduled:   Yes   Last ordered: 02/13/2020 1 ml,  12 refills  No protocol assigned to this medication and it's usually a non delegated medication   Requested Prescriptions  Pending Prescriptions Disp Refills   cyanocobalamin (,VITAMIN B-12,) 1000 MCG/ML injection [Pharmacy Med Name: CYANOCOBALAMIN 1000MCG/ML INJ, 1ML] 1 mL 12    Sig: ADMINISTER 1 ML(1000 MCG) IN THE MUSCLE EVERY 30 DAYS      Off-Protocol Failed - 02/17/2021  4:33 AM      Failed - Medication not assigned to a protocol, review manually.      Passed - Valid encounter within last 12 months    Recent Outpatient Visits           2 months ago Prediabetes   [facility] Glean Hess, MD   6 months ago Prediabetes   [facility] Glean Hess, MD   7 months ago Acute non-recurrent maxillary sinusitis   [facility] Glean Hess, MD   10 months ago Primary insomnia   [facility] Glean Hess, MD   1 year ago Annual physical exam   [facility] Glean Hess, MD       Future Appointments             In 2 months Glean Hess, MD [facility], [facility]            Off-Protocol Failed - 02/17/2021  4:33 AM      Failed - Medication not assigned to a protocol, review manually.      Passed - Valid encounter within last 12 months    Recent Outpatient Visits           2 months ago Prediabetes   [facility] Glean Hess, MD   6 months ago Prediabetes   [facility] Glean Hess, MD   7 months ago Acute non-recurrent maxillary sinusitis   [facility] Glean Hess, MD   10 months ago Primary insomnia   [facility] Glean Hess, MD   1 year ago Annual physical exam   [facility] Glean Hess, MD       Future Appointments              In 2 months Army Melia Jesse Sans, MD [facility], [facility]

## 2021-02-25 ENCOUNTER — Other Ambulatory Visit: Payer: Self-pay

## 2021-02-25 ENCOUNTER — Encounter: Payer: Self-pay | Admitting: Internal Medicine

## 2021-02-25 DIAGNOSIS — R531 Weakness: Secondary | ICD-10-CM

## 2021-02-25 DIAGNOSIS — Z7189 Other specified counseling: Secondary | ICD-10-CM

## 2021-02-25 DIAGNOSIS — Z9889 Other specified postprocedural states: Secondary | ICD-10-CM

## 2021-02-27 DIAGNOSIS — Z9884 Bariatric surgery status: Secondary | ICD-10-CM | POA: Diagnosis not present

## 2021-02-27 DIAGNOSIS — R112 Nausea with vomiting, unspecified: Secondary | ICD-10-CM | POA: Diagnosis not present

## 2021-03-03 ENCOUNTER — Emergency Department: Payer: BC Managed Care – PPO

## 2021-03-03 ENCOUNTER — Observation Stay
Admission: EM | Admit: 2021-03-03 | Discharge: 2021-03-03 | Disposition: A | Discharge status: Other Acute Inpatient Hospital | Payer: BC Managed Care – PPO | Attending: Internal Medicine | Admitting: Internal Medicine

## 2021-03-03 ENCOUNTER — Other Ambulatory Visit: Payer: Self-pay

## 2021-03-03 ENCOUNTER — Encounter: Payer: Self-pay | Admitting: Emergency Medicine

## 2021-03-03 ENCOUNTER — Inpatient Hospital Stay: Payer: BC Managed Care – PPO

## 2021-03-03 DIAGNOSIS — R079 Chest pain, unspecified: Secondary | ICD-10-CM | POA: Diagnosis not present

## 2021-03-03 DIAGNOSIS — R52 Pain, unspecified: Secondary | ICD-10-CM | POA: Diagnosis not present

## 2021-03-03 DIAGNOSIS — L03312 Cellulitis of back [any part except buttock]: Secondary | ICD-10-CM | POA: Insufficient documentation

## 2021-03-03 DIAGNOSIS — K219 Gastro-esophageal reflux disease without esophagitis: Secondary | ICD-10-CM | POA: Diagnosis not present

## 2021-03-03 DIAGNOSIS — Z86711 Personal history of pulmonary embolism: Secondary | ICD-10-CM

## 2021-03-03 DIAGNOSIS — G4733 Obstructive sleep apnea (adult) (pediatric): Secondary | ICD-10-CM

## 2021-03-03 DIAGNOSIS — G43909 Migraine, unspecified, not intractable, without status migrainosus: Secondary | ICD-10-CM | POA: Diagnosis not present

## 2021-03-03 DIAGNOSIS — K760 Fatty (change of) liver, not elsewhere classified: Secondary | ICD-10-CM | POA: Diagnosis not present

## 2021-03-03 DIAGNOSIS — R197 Diarrhea, unspecified: Secondary | ICD-10-CM | POA: Insufficient documentation

## 2021-03-03 DIAGNOSIS — F419 Anxiety disorder, unspecified: Secondary | ICD-10-CM | POA: Diagnosis not present

## 2021-03-03 DIAGNOSIS — R1084 Generalized abdominal pain: Secondary | ICD-10-CM | POA: Insufficient documentation

## 2021-03-03 DIAGNOSIS — R059 Cough, unspecified: Secondary | ICD-10-CM | POA: Diagnosis not present

## 2021-03-03 DIAGNOSIS — Z91040 Latex allergy status: Secondary | ICD-10-CM | POA: Diagnosis not present

## 2021-03-03 DIAGNOSIS — Z8616 Personal history of COVID-19: Secondary | ICD-10-CM | POA: Diagnosis not present

## 2021-03-03 DIAGNOSIS — Z9989 Dependence on other enabling machines and devices: Secondary | ICD-10-CM | POA: Diagnosis not present

## 2021-03-03 DIAGNOSIS — L02211 Cutaneous abscess of abdominal wall: Secondary | ICD-10-CM | POA: Diagnosis not present

## 2021-03-03 DIAGNOSIS — I10 Essential (primary) hypertension: Secondary | ICD-10-CM | POA: Diagnosis not present

## 2021-03-03 DIAGNOSIS — G8929 Other chronic pain: Secondary | ICD-10-CM | POA: Diagnosis present

## 2021-03-03 DIAGNOSIS — D649 Anemia, unspecified: Secondary | ICD-10-CM | POA: Diagnosis not present

## 2021-03-03 DIAGNOSIS — K651 Peritoneal abscess: Secondary | ICD-10-CM | POA: Diagnosis not present

## 2021-03-03 DIAGNOSIS — T8143XA Infection following a procedure, organ and space surgical site, initial encounter: Secondary | ICD-10-CM | POA: Diagnosis not present

## 2021-03-03 DIAGNOSIS — R110 Nausea: Secondary | ICD-10-CM | POA: Diagnosis not present

## 2021-03-03 DIAGNOSIS — Z6841 Body Mass Index (BMI) 40.0 and over, adult: Secondary | ICD-10-CM | POA: Diagnosis not present

## 2021-03-03 DIAGNOSIS — R109 Unspecified abdominal pain: Secondary | ICD-10-CM | POA: Diagnosis not present

## 2021-03-03 DIAGNOSIS — Z20822 Contact with and (suspected) exposure to covid-19: Secondary | ICD-10-CM | POA: Diagnosis not present

## 2021-03-03 DIAGNOSIS — Z9884 Bariatric surgery status: Secondary | ICD-10-CM | POA: Diagnosis not present

## 2021-03-03 DIAGNOSIS — R600 Localized edema: Secondary | ICD-10-CM | POA: Diagnosis not present

## 2021-03-03 DIAGNOSIS — E876 Hypokalemia: Secondary | ICD-10-CM | POA: Diagnosis not present

## 2021-03-03 DIAGNOSIS — E46 Unspecified protein-calorie malnutrition: Secondary | ICD-10-CM | POA: Diagnosis not present

## 2021-03-03 DIAGNOSIS — T85698A Other mechanical complication of other specified internal prosthetic devices, implants and grafts, initial encounter: Secondary | ICD-10-CM | POA: Diagnosis not present

## 2021-03-03 DIAGNOSIS — L03311 Cellulitis of abdominal wall: Secondary | ICD-10-CM | POA: Diagnosis not present

## 2021-03-03 DIAGNOSIS — R160 Hepatomegaly, not elsewhere classified: Secondary | ICD-10-CM | POA: Diagnosis not present

## 2021-03-03 DIAGNOSIS — A419 Sepsis, unspecified organism: Principal | ICD-10-CM | POA: Insufficient documentation

## 2021-03-03 DIAGNOSIS — T8579XA Infection and inflammatory reaction due to other internal prosthetic devices, implants and grafts, initial encounter: Secondary | ICD-10-CM | POA: Diagnosis not present

## 2021-03-03 DIAGNOSIS — E538 Deficiency of other specified B group vitamins: Secondary | ICD-10-CM

## 2021-03-03 DIAGNOSIS — Z751 Person awaiting admission to adequate facility elsewhere: Secondary | ICD-10-CM | POA: Diagnosis not present

## 2021-03-03 DIAGNOSIS — M545 Low back pain, unspecified: Secondary | ICD-10-CM

## 2021-03-03 DIAGNOSIS — Z90710 Acquired absence of both cervix and uterus: Secondary | ICD-10-CM | POA: Diagnosis present

## 2021-03-03 DIAGNOSIS — M5416 Radiculopathy, lumbar region: Secondary | ICD-10-CM | POA: Diagnosis present

## 2021-03-03 DIAGNOSIS — J9811 Atelectasis: Secondary | ICD-10-CM | POA: Diagnosis not present

## 2021-03-03 DIAGNOSIS — F1120 Opioid dependence, uncomplicated: Secondary | ICD-10-CM | POA: Diagnosis not present

## 2021-03-03 HISTORY — DX: Gastro-esophageal reflux disease without esophagitis: K21.9

## 2021-03-03 HISTORY — DX: Cellulitis of abdominal wall: L03.311

## 2021-03-03 LAB — URINALYSIS, COMPLETE (UACMP) WITH MICROSCOPIC
Bilirubin Urine: NEGATIVE
Glucose, UA: NEGATIVE mg/dL
Hgb urine dipstick: NEGATIVE
Ketones, ur: NEGATIVE mg/dL
Nitrite: NEGATIVE
Protein, ur: NEGATIVE mg/dL
Specific Gravity, Urine: 1.04 — ABNORMAL HIGH (ref 1.005–1.030)
pH: 5 (ref 5.0–8.0)

## 2021-03-03 LAB — CBC WITH DIFFERENTIAL/PLATELET
Abs Immature Granulocytes: 0.18 10*3/uL — ABNORMAL HIGH (ref 0.00–0.07)
Basophils Absolute: 0.1 10*3/uL (ref 0.0–0.1)
Basophils Relative: 1 %
Eosinophils Absolute: 0.2 10*3/uL (ref 0.0–0.5)
Eosinophils Relative: 1 %
HCT: 26.8 % — ABNORMAL LOW (ref 36.0–46.0)
Hemoglobin: 8.8 g/dL — ABNORMAL LOW (ref 12.0–15.0)
Immature Granulocytes: 1 %
Lymphocytes Relative: 12 %
Lymphs Abs: 1.6 10*3/uL (ref 0.7–4.0)
MCH: 28.7 pg (ref 26.0–34.0)
MCHC: 32.8 g/dL (ref 30.0–36.0)
MCV: 87.3 fL (ref 80.0–100.0)
Monocytes Absolute: 1.3 10*3/uL — ABNORMAL HIGH (ref 0.1–1.0)
Monocytes Relative: 10 %
Neutro Abs: 10.2 10*3/uL — ABNORMAL HIGH (ref 1.7–7.7)
Neutrophils Relative %: 75 %
Platelets: 384 10*3/uL (ref 150–400)
RBC: 3.07 MIL/uL — ABNORMAL LOW (ref 3.87–5.11)
RDW: 16.6 % — ABNORMAL HIGH (ref 11.5–15.5)
Smear Review: NORMAL
WBC: 13.6 10*3/uL — ABNORMAL HIGH (ref 4.0–10.5)
nRBC: 0 % (ref 0.0–0.2)

## 2021-03-03 LAB — LACTIC ACID, PLASMA: Lactic Acid, Venous: 1.5 mmol/L (ref 0.5–1.9)

## 2021-03-03 LAB — COMPREHENSIVE METABOLIC PANEL
ALT: 33 U/L (ref 0–44)
AST: 36 U/L (ref 15–41)
Albumin: 1.6 g/dL — ABNORMAL LOW (ref 3.5–5.0)
Alkaline Phosphatase: 66 U/L (ref 38–126)
Anion gap: 9 (ref 5–15)
BUN: 13 mg/dL (ref 6–20)
CO2: 26 mmol/L (ref 22–32)
Calcium: 8.6 mg/dL — ABNORMAL LOW (ref 8.9–10.3)
Chloride: 102 mmol/L (ref 98–111)
Creatinine, Ser: 0.77 mg/dL (ref 0.44–1.00)
GFR, Estimated: >60 mL/min (ref >60)
Glucose, Bld: 126 mg/dL — ABNORMAL HIGH (ref 70–99)
Potassium: 3.3 mmol/L — ABNORMAL LOW (ref 3.5–5.1)
Sodium: 137 mmol/L (ref 135–145)
Total Bilirubin: 0.9 mg/dL (ref 0.3–1.2)
Total Protein: 6.1 g/dL — ABNORMAL LOW (ref 6.5–8.1)

## 2021-03-03 LAB — TYPE AND SCREEN
ABO/RH(D): B POS
Antibody Screen: NEGATIVE

## 2021-03-03 LAB — MAGNESIUM: Magnesium: 1.9 mg/dL (ref 1.7–2.4)

## 2021-03-03 LAB — RESP PANEL BY RT-PCR (FLU A&B, COVID) ARPGX2
Influenza A by PCR: NEGATIVE
Influenza B by PCR: NEGATIVE
SARS Coronavirus 2 by RT PCR: NEGATIVE

## 2021-03-03 MED ORDER — RIZATRIPTAN BENZOATE 10 MG PO TBDP: 10.0000 mg | ORAL_TABLET | Freq: Every day | ORAL | Status: DC | PRN

## 2021-03-03 MED ORDER — HYDROMORPHONE HCL 1 MG/ML IJ SOLN
1.0000 mg | INTRAMUSCULAR | Status: DC | PRN
  Administered 2021-03-03: 1 mg via INTRAVENOUS
  Filled 2021-03-03: qty 1

## 2021-03-03 MED ORDER — LACTATED RINGERS IV BOLUS: 1000.0000 mL | Freq: Once | INTRAVENOUS | Status: DC

## 2021-03-03 MED ORDER — DEXTROSE-NACL 5-0.45 % IV SOLN: INTRAVENOUS | Status: DC

## 2021-03-03 MED ORDER — HEPARIN SODIUM (PORCINE) 5000 UNIT/ML IJ SOLN: 5000.0000 [IU] | Freq: Three times a day (TID) | INTRAMUSCULAR | Status: DC

## 2021-03-03 MED ORDER — PANCRELIPASE (LIP-PROT-AMYL) 36000-114000 UNITS PO CPEP
36000.0000 [IU] | ORAL_CAPSULE | Freq: Every day | ORAL | Status: DC
  Administered 2021-03-03: 36000 [IU] via ORAL
  Filled 2021-03-03 (×4): qty 1

## 2021-03-03 MED ORDER — VANCOMYCIN HCL 1500 MG/300ML IV SOLN
1500.0000 mg | Freq: Once | INTRAVENOUS | Status: AC
  Administered 2021-03-03: 1500 mg via INTRAVENOUS
  Filled 2021-03-03: qty 300

## 2021-03-03 MED ORDER — ONDANSETRON HCL 4 MG/2ML IJ SOLN: 4.0000 mg | Freq: Four times a day (QID) | INTRAMUSCULAR | Status: DC | PRN

## 2021-03-03 MED ORDER — SUVOREXANT 20 MG PO TABS: 20.0000 mg | ORAL_TABLET | Freq: Every day | ORAL | Status: DC

## 2021-03-03 MED ORDER — MIDAZOLAM HCL 2 MG/2ML IJ SOLN
INTRAMUSCULAR | Status: AC | PRN
  Administered 2021-03-03 (×2): 1 mg via INTRAVENOUS

## 2021-03-03 MED ORDER — ACETAMINOPHEN 500 MG PO TABS: 1000.0000 mg | ORAL_TABLET | Freq: Four times a day (QID) | ORAL | Status: DC | PRN

## 2021-03-03 MED ORDER — METRONIDAZOLE 500 MG/100ML IV SOLN
500.0000 mg | Freq: Three times a day (TID) | INTRAVENOUS | Status: DC
  Administered 2021-03-03: 500 mg via INTRAVENOUS
  Filled 2021-03-03: qty 100

## 2021-03-03 MED ORDER — VANCOMYCIN HCL IN DEXTROSE 1-5 GM/200ML-% IV SOLN: 1000.0000 mg | Freq: Two times a day (BID) | INTRAVENOUS | Status: DC

## 2021-03-03 MED ORDER — IOHEXOL 350 MG/ML SOLN
100.0000 mL | Freq: Once | INTRAVENOUS | Status: AC | PRN
  Administered 2021-03-03: 100 mL via INTRAVENOUS

## 2021-03-03 MED ORDER — FENTANYL CITRATE (PF) 100 MCG/2ML IJ SOLN
INTRAMUSCULAR | Status: AC
  Filled 2021-03-03: qty 2

## 2021-03-03 MED ORDER — MIDAZOLAM HCL 2 MG/2ML IJ SOLN
INTRAMUSCULAR | Status: AC
  Filled 2021-03-03: qty 2

## 2021-03-03 MED ORDER — FENTANYL CITRATE (PF) 100 MCG/2ML IJ SOLN
INTRAMUSCULAR | Status: AC | PRN
  Administered 2021-03-03 (×2): 50 ug via INTRAVENOUS

## 2021-03-03 MED ORDER — CIPROFLOXACIN IN D5W 400 MG/200ML IV SOLN
400.0000 mg | Freq: Once | INTRAVENOUS | Status: AC
  Administered 2021-03-03: 400 mg via INTRAVENOUS
  Filled 2021-03-03: qty 200

## 2021-03-03 MED ORDER — VANCOMYCIN HCL IN DEXTROSE 1-5 GM/200ML-% IV SOLN
1000.0000 mg | Freq: Once | INTRAVENOUS | Status: AC
  Administered 2021-03-03: 1000 mg via INTRAVENOUS
  Filled 2021-03-03: qty 200

## 2021-03-03 MED ORDER — ONDANSETRON HCL 4 MG PO TABS: 4.0000 mg | ORAL_TABLET | Freq: Four times a day (QID) | ORAL | Status: DC | PRN

## 2021-03-03 MED ORDER — POTASSIUM CHLORIDE 10 MEQ/100ML IV SOLN
10.0000 meq | INTRAVENOUS | Status: AC
  Administered 2021-03-03 (×4): 10 meq via INTRAVENOUS
  Filled 2021-03-03 (×3): qty 100

## 2021-03-03 MED ORDER — KETOROLAC TROMETHAMINE 30 MG/ML IJ SOLN
30.0000 mg | Freq: Four times a day (QID) | INTRAMUSCULAR | Status: DC | PRN
Start: 2021-03-03 — End: 2021-03-04
  Administered 2021-03-03: 30 mg via INTRAVENOUS
  Filled 2021-03-03: qty 1

## 2021-03-03 MED ORDER — ACETAMINOPHEN 650 MG RE SUPP: 650.0000 mg | Freq: Four times a day (QID) | RECTAL | Status: DC | PRN

## 2021-03-03 MED ORDER — NORTRIPTYLINE HCL 10 MG PO CAPS
10.0000 mg | ORAL_CAPSULE | Freq: Every day | ORAL | Status: DC
  Filled 2021-03-03: qty 1

## 2021-03-03 MED ORDER — METRONIDAZOLE 500 MG/100ML IV SOLN
500.0000 mg | Freq: Once | INTRAVENOUS | Status: AC
  Administered 2021-03-03: 500 mg via INTRAVENOUS
  Filled 2021-03-03: qty 100

## 2021-03-03 MED ORDER — LACTATED RINGERS IV BOLUS
1000.0000 mL | Freq: Once | INTRAVENOUS | Status: AC
  Administered 2021-03-03: 1000 mL via INTRAVENOUS

## 2021-03-03 MED ORDER — PANTOPRAZOLE SODIUM 40 MG IV SOLR
40.0000 mg | INTRAVENOUS | Status: DC
  Administered 2021-03-03: 40 mg via INTRAVENOUS
  Filled 2021-03-03: qty 40

## 2021-03-03 MED ORDER — LACTATED RINGERS IV SOLN: INTRAVENOUS | Status: DC

## 2021-03-03 MED ORDER — CIPROFLOXACIN IN D5W 400 MG/200ML IV SOLN: 400.0000 mg | Freq: Two times a day (BID) | INTRAVENOUS | Status: DC

## 2021-03-03 MED ORDER — HYDROMORPHONE HCL 1 MG/ML IJ SOLN
1.0000 mg | Freq: Once | INTRAMUSCULAR | Status: AC
  Administered 2021-03-03: 1 mg via INTRAVENOUS
  Filled 2021-03-03: qty 1

## 2021-03-03 MED ORDER — SUMATRIPTAN SUCCINATE 50 MG PO TABS
100.0000 mg | ORAL_TABLET | ORAL | Status: DC | PRN
  Filled 2021-03-03: qty 2

## 2021-03-03 NOTE — ED Notes (Signed)
Pt transported to CT ?

## 2021-03-03 NOTE — Consult Note (Signed)
CODE SEPSIS - PHARMACY COMMUNICATION  **Broad Spectrum Antibiotics should be administered within 1 hour of Sepsis diagnosis**  Time Code Sepsis Called/Page Received: 1125  Antibiotics Ordered: ciprofloxacin + metronidazole   Time of 1st antibiotic administration: [place] ,PharmD Clinical Pharmacist  03/03/2021  11:26 AM

## 2021-03-03 NOTE — Sepsis Progress Note (Signed)
Code sepsis cancelled

## 2021-03-03 NOTE — Consult Note (Signed)
Subjective:   CC: enterocutaneous fistula, abdominal wall abscess  HPI:  Kimberly Cowan is a 55 y.o. female who was consulted by Tamala Julian for issue above.  Symptoms were first noted several days ago. Pain is sharp, worsening.  Associated with leaking ostomy, exacerbated by touch.  Hx of J tube placement for malnutrition.  J tube dysfunction was noted and had to be removed, but developed EC fistula at that site.  Controlled with ostomy placement, TPN, but recently noted the increasing pain in the area along with frequent leaks around the bag.    She also had chronic back pain issues with stimulator placement.  She states the pain around the stimulator increased couple days after the J tube placement and has been consistent since.  She states she slept on it the wrong the way and that is what started the back pain.   Past Medical History:  has a past medical history of Allergy (1999), Anemia, Anxiety, Arm pain, left (05/17/2018), Arthritis, Bilateral lumbar radiculopathy, Chronic back pain, Chronic GERD (03/03/2021), Clinical depression (04/04/2012), Depression, DVT (deep venous thrombosis) ([place]), Encounter for screening colonoscopy, Glaucoma, Headache(784.0), Major depressive disorder with single episode, in full remission ([place]) (08/15/2018), Ovarian cyst, Pneumonia, and Sleep apnea.  Past Surgical History:  Past Surgical History:  Procedure Laterality Date  . ABDOMINAL HYSTERECTOMY  10/2006  . BACK SURGERY  2014   lumbar fusion  . BACK SURGERY  2009   discectomy  . BARIATRIC SURGERY  07/2020   Revision  . [place]  . COLONOSCOPY WITH PROPOFOL N/A 04/12/2018   Procedure: COLONOSCOPY WITH PROPOFOL;  Surgeon: Lin Landsman, MD;  Location: [facility] ENDOSCOPY;  Service: Gastroenterology;  Laterality: N/A;  . GASTRIC BYPASS  2005  . KNEE ARTHROSCOPY WITH LATERAL MENISECTOMY Left 05/27/2017   Procedure: KNEE ARTHROSCOPY WITH LATERAL MENISECTOMY;  Surgeon: Hessie Knows,  MD;  Location: [facility] ORS;  Service: Orthopedics;  Laterality: Left;  . KNEE ARTHROSCOPY WITH LATERAL RELEASE Left 05/27/2017   Procedure: KNEE ARTHROSCOPY WITH LATERAL RELEASE;  Surgeon: Hessie Knows, MD;  Location: [facility] ORS;  Service: Orthopedics;  Laterality: Left;  . LAPAROSCOPIC SALPINGO OOPHERECTOMY Bilateral 10/05/2016   Procedure: LAPAROSCOPIC SALPINGO OOPHORECTOMY;  Surgeon: Brayton Mars, MD;  Location: [facility] ORS;  Service: Gynecology;  Laterality: Bilateral;  . REDUCTION MAMMAPLASTY Bilateral    2001  . SPINAL CORD STIMULATOR INSERTION N/A 03/20/2016   Procedure: LUMBAR SPINAL CORD STIMULATOR INSERTION;  Surgeon: Eustace Moore, MD;  Location: [place] NEURO ORS;  Service: Neurosurgery;  Laterality: N/A;  . SPINE SURGERY  2009 2014 2017  . TONSILLECTOMY      Family History: family history includes Diabetes in her mother and sister; Hypertension in her father, mother, and sister; Obesity in her daughter, mother, sister, and sister.  Social History:  reports that she has never smoked. She has never used smokeless tobacco. She reports that she does not drink alcohol and does not use drugs.  Current Medications:  Prior to Admission medications   Medication Sig Start Date End Date Taking? Authorizing Provider  cyclobenzaprine (FLEXERIL) 10 MG tablet Take 10 mg by mouth daily as needed for muscle spasms.   Yes [provider]  latanoprost (XALATAN) 0.005 % ophthalmic solution Place 1 drop into both eyes at bedtime.    Yes [provider]  oxyCODONE-acetaminophen (PERCOCET/ROXICET) 5-325 MG tablet Take 1 tablet by mouth every 6 (six) hours as needed for moderate pain.   Yes [provider]  pantoprazole (PROTONIX) 40  MG tablet Take 40 mg by mouth daily.   Yes [provider]  rizatriptan (MAXALT-MLT) 10 MG disintegrating tablet DISSOLVE 1 TABLET BY MOUTH AS NEEDED FOR MIGRAINE- MAY REPEAT IN 2 HOURS AS NEEDED Patient taking differently: Take 10 mg by mouth  daily as needed for migraine. (repeat after 2 hours if needed) 11/25/20  Yes Glean Hess, MD  Suvorexant (BELSOMRA) 20 MG TABS Take 1 tablet by mouth at bedtime as needed. Patient taking differently: Take 20 mg by mouth at bedtime. 02/03/21  Yes Glean Hess, MD  cyanocobalamin (,VITAMIN B-12,) 1000 MCG/ML injection ADMINISTER 1 ML(1000 MCG) IN THE MUSCLE EVERY 30 DAYS Patient not taking: No sig reported 02/17/21   Glean Hess, MD  fluticasone [facility]) 50 MCG/ACT nasal spray SHAKE LIQUID AND USE 2 SPRAYS IN [facility] NOSTRIL DAILY Patient not taking: No sig reported 09/24/20   Glean Hess, MD  levocetirizine (XYZAL) 5 MG tablet TAKE 1 TABLET(5 MG) BY MOUTH EVERY EVENING Patient not taking: No sig reported 04/09/20   Glean Hess, MD    Allergies:  Allergies as of 03/03/2021 - Review Complete 03/03/2021  Allergen Reaction Noted  . Morphine and related Hives, Shortness Of Breath, Nausea And Vomiting, and Other (See Comments) 10/28/2012  . Penicillins Anaphylaxis, Hives, Shortness Of Breath, and Nausea And Vomiting 10/28/2012  . Latex Hives 08/12/2015  . Baclofen Nausea Only 03/18/2016    ROS:  General: Denies weight loss, weight gain, fatigue, fevers, chills, and night sweats. Eyes: Denies blurry vision, double vision, eye pain, itchy eyes, and tearing. Ears: Denies hearing loss, earache, and ringing in ears. Nose: Denies sinus pain, congestion, infections, runny nose, and nosebleeds. Mouth/throat: Denies hoarseness, sore throat, bleeding gums, and difficulty swallowing. Heart: Denies chest pain, palpitations, racing heart, irregular heartbeat, leg pain or swelling, and decreased activity tolerance. Respiratory: Denies breathing difficulty, shortness of breath, wheezing, cough, and sputum. GI: Denies change in appetite, heartburn, nausea, vomiting, constipation, diarrhea, and blood in stool. GU: Denies difficulty urinating, pain with urinating, urgency, frequency, blood  in urine. Musculoskeletal: Denies joint stiffness, pain, swelling, muscle weakness. Skin: Denies rash, itching, mass, tumors, sores, and boils Neurologic: Denies headache, fainting, dizziness, seizures, numbness, and tingling. Psychiatric: Denies depression, anxiety, difficulty sleeping, and memory loss. Endocrine: Denies heat or cold intolerance, and increased thirst or urination. Blood/lymph: Denies easy bruising, easy bruising, and swollen glands     Objective:     BP 115/75   Pulse (!) 110   Temp 98.5 F (36.9 C) (Oral)   Resp (!) 36   Ht 5\' 7"  (1.702 m)   Wt 123.4 kg   SpO2 91%   BMI 42.60 kg/m   Constitutional :  alert, cooperative and appears stated age  Lymphatics/Throat:  no asymmetry, masses, or scars  Respiratory:  clear to auscultation bilaterally  Cardiovascular:  tachy rate, regular rhythm  Gastrointestinal: soft, no guarding, exquisite tenderness around open wound about 1cm in length at former J tube site in LUQ with active purulent/feculant drainage consistent with EC fistula.  No obvoius erythema noted with some skin irritation around the open wound.  The edema and induration however, extends towards the left flank toward the back on limited exam..   Musculoskeletal: Steady movement of upper extremities.  Unable to roll over on bed secondary to pain  Skin: Cool and moist, lap chole incision scars c/d/i.    Psychiatric: Normal affect, non-agitated, not confused       LABS:  CMP Latest Ref Rng &  Units 03/03/2021 11/25/2020 07/24/2020  Glucose 70 - 99 mg/dL 126(H) 89 85  BUN 6 - 20 mg/dL 13 5(L) 7  Creatinine 0.44 - 1.00 mg/dL 0.77 0.67 0.79  Sodium 135 - 145 mmol/L 137 144 145(H)  Potassium 3.5 - 5.1 mmol/L 3.3(L) 3.6 4.0  Chloride 98 - 111 mmol/L 102 104 108(H)  CO2 22 - 32 mmol/L 26 25 27   Calcium 8.9 - 10.3 mg/dL 8.6(L) 9.6 9.9  Total Protein 6.5 - 8.1 g/dL 6.1(L) 6.7 6.7  Total Bilirubin 0.3 - 1.2 mg/dL 0.9 0.5 0.2  Alkaline Phos 38 - 126 U/L 66 97 88   AST 15 - 41 U/L 36 27 14  ALT 0 - 44 U/L 33 27 10   CBC Latest Ref Rng & Units 03/03/2021 01/16/2020 01/09/2019  WBC 4.0 - 10.5 K/uL 13.6(H) 4.7 4.7  Hemoglobin 12.0 - 15.0 g/dL 8.8(L) 12.3 10.8(L)  Hematocrit 36.0 - 46.0 % 26.8(L) 38.7 34.9  Platelets 150 - 400 K/uL 384 301 301    RADS: CLINICAL DATA:  55 year old female with abdominal pain. Difficult to exclude draining abscess from left lower quadrant ileostomy site on noncontrast study earlier today.  EXAM: CT ABDOMEN AND PELVIS WITH CONTRAST  TECHNIQUE: Multidetector CT imaging of the abdomen and pelvis was performed using the standard protocol following bolus administration of intravenous contrast.  CONTRAST:  160mL OMNIPAQUE IOHEXOL 350 MG/ML SOLN  COMPARISON:  CT Abdomen and Pelvis without contrast 0825 hours.  CTA chest reported separately.  FINDINGS: Lower chest: Reported separately today.  Hepatobiliary: Diminutive or absent gallbladder.  Negative liver.  Pancreas: Negative.  Spleen: Negative.  Adrenals/Urinary Tract: Negative. Pelvic phleboliths. No urinary calculus or obstructive uropathy.  Stomach/Bowel: Redundant but largely decompressed large bowel from the splenic flexure distally. Gas-filled transverse colon also contains a small volume of fluid. Mostly decompressed right colon. Decompressed terminal ileum.  Small bowel anastomosis in the right mid abdomen on series 2, image 65 with no adverse features. Sequelae of ileostomy take down suspected at the left abdominal wall although a bag remains in place. But no obvious ileostomy or colostomy loop can be identified. Moderate inflammatory stranding there, and thin mildly complex fluid collection tracking inferiorly and laterally in the subcutaneous space on series 2, image 71 (about 1.8 cm thickness and roughly 14 cm in length). Early rim enhancement there. No soft tissue gas.  Sequelae of gastric bypass also with gastrojejunostomy.  Small volume of fluid in the bypassed portion of the stomach. No free air or free fluid.  Vascular/Lymphatic: Major vascular structures in the abdomen and pelvis appear patent. No lymphadenopathy.  Reproductive: Absent uterus.  Diminutive or absent ovaries.  Other: No pelvic free fluid.  Musculoskeletal: Lower thoracic spinal stimulator device and previous lumbosacral fusion again noted. Severe right SI joint degeneration. No acute osseous abnormality identified.  IMPRESSION: 1. Postoperative changes and inflammation underlying the left abdominal ostomy bag, but no ostomy bowel loop can be delineated. Suspect this reflects a recent ostomy take-down, correlate with surgical history. In addition to inflammatory stranding in the ostomy region there is an elongated 1-2 cm thick fluid collection tracking about 14 cm into the left lateral and lower abdominal wall with early rim enhancement suspicious for abscess.  2. No bowel dilatation or obstruction. No free air or free fluid. Previous gastrojejunostomy also.  3. Chest CTA reported separately.   Electronically Signed   By: Genevie Ann M.D.   On: 03/03/2021 10:23  Assessment:   Abdominal wall abscess secondary to [facility]  fistula- images reviewed by myself and agree with report above, with additional clinical concerns of possible infection spreading toward her spine simulator Malnutrition- albumin 1.6 and pre-albumin of 5 from outside labs Morbid obesity Elevated A1c Deconditioning Hx of OSA on CPAP?  Plan:    Complicated EC fistula with associated abscess- IR to drain the fluid collection.  Initially recommended by IR possible attempt at recannulating the jejujum via fluoroscopy, but patient refused this, so will proceed with subcutaneous drain placement only at this time.  NPO, TPN, local wound care with ostomy RN to further minimize output.  Will continue IV abx and monitoring for worsening infection while awaiting  transfer back to [place] for more definitive care with primary surgeon, Dr. Gustavus Messing.  Case discussed with him personally over the phone and he is in agreement with the plan.  Proceeding with surgical intervention at this time, likely will yield more issues than benefit due to her current nutritional status.  Recommend PT/OT in the meantime as well.  Further care for chronic conditions per hospitalist.  Plan discussed with her and she is in agreement as well.

## 2021-03-03 NOTE — ED Triage Notes (Signed)
Pt to ED via [facility] from home with c/o colostomy bag to L abd, per EMS colostomy bag leaking, unsure if from site or bag malfunction. Per pt home health nurse came 2 times to home but never changed colostomy bag. Pt states dx with covid on 5/19.     142/113

## 2021-03-03 NOTE — Discharge Summary (Signed)
Physician Discharge Summary  Kimberly Cowan [place] JJH:[id number] DOB: 1966-07-22 DOA: 03/03/2021  PCP: Glean Hess, MD  Admit date: 03/03/2021 Discharge date: 03/03/2021  Admitted From: Home Disposition: [facility]  Recommendations for Outpatient Follow-up: Per discharge instruction from [facility] facility  Discharge Condition: Stable CODE STATUS: Full code Diet recommendation: Heart Healthy / Carb Modified  Brief/Interim Summary:  Please refer to H&P for full summary.  Patient was admitted for abdominal pain secondary to abdominal abscess and meeting sepsis criteria with increased respiration rate, heart rate, elevated WBC, fever, and source of abdominal abscess.  Patient was treated with broad-spectrum antibiotic vancomycin, ciprofloxacin, metronidazole.  General surgery was consulted and recommended IR consult for abscess drainage. [facility] bariatric surgeon has been called and said that they have accepted the patient however pending bed.  Patient underwent10 [place] drain placed in the left lower quadrant subcutaneous abscess via interventional radiology.  Patient was sent excepted to [facility] and transferred to facility for further care.  Discharge Diagnoses:  Principal Problem:   Sepsis ([place]) Active Problems:   Headache, migraine   Status post gastric bypass for obesity   Bilateral lumbar radiculopathy   OSA on CPAP   Morbid obesity ([place])   History of pulmonary embolism   Status post hysterectomy   Chronic GERD   Hypokalemia  Discharge Instructions  Per accepting facility   Allergies  Allergen Reactions  . Morphine And Related Hives, Shortness Of Breath, Nausea And Vomiting and Other (See Comments)    "can't breathe"  . Penicillins Anaphylaxis, Hives, Shortness Of Breath and Nausea And Vomiting    "can't breathe", Has patient had a PCN reaction causing immediate rash, facial/tongue/throat swelling, SOB or lightheadedness with hypotension: Yes Has patient had  a PCN reaction causing severe rash involving mucus membranes or skin necrosis: No Has patient had a PCN reaction that required hospitalization No Has patient had a PCN reaction occurring within the last 10 years: No If all of the above answers are "NO", then may proceed with Cephalosporin use.   . Latex Hives  . Baclofen Nausea Only    Jittery, anxious   Consultations:  General surgery  Interventional radiology  Consultation with bariatric surgery at [facility]  Procedures/Studies: CT ABDOMEN PELVIS WO CONTRAST  Result Date: 03/03/2021 CLINICAL DATA:  Abdominal pain.  Leaking colostomy bag. EXAM: CT ABDOMEN AND PELVIS WITHOUT CONTRAST TECHNIQUE: Multidetector CT imaging of the abdomen and pelvis was performed following the standard protocol without IV contrast. COMPARISON:  08/31/2016. FINDINGS: Lower chest: Chronic appearing volume loss in the right middle and right lower lobes, adjacent to an elevated right hemidiaphragm. Heart size normal. No pericardial or pleural effusion. Distal esophagus is unremarkable. Hepatobiliary: Liver is decreased in attenuation diffusely. Heterogeneous area of decreased attenuation along the falciform ligament may be due to additional fat deposition. Liver is minimally enlarged, 18.1 cm. Cholecystectomy. No unexpected biliary ductal dilatation. Pancreas: Negative. Spleen: Negative. Adrenals/Urinary Tract: Adrenal glands and kidneys are unremarkable. Ureters are decompressed. There may be slight ventral bladder wall thickening. Stomach/Bowel: Gastric bypass. Left lower quadrant ileostomy. Associated subcutaneous edema and possible scattered fluid. Colon is unremarkable. Vascular/Lymphatic: Vascular structures are unremarkable. No pathologically enlarged lymph nodes. Reproductive: Uterus is visualized.  No adnexal mass. Other: No free fluid.  Mesenteries and peritoneum are unremarkable. Musculoskeletal: Postoperative and degenerative changes in the spine. Spinal  stimulator wires are seen in the thoracic spine. IMPRESSION: 1. Left lower quadrant ileostomy. Associated subcutaneous edema and scattered fluid. Difficult to exclude a draining abscess. Consider repeat imaging  with IV contrast, as clinically indicated. 2. Enlarged steatotic liver with probable focal fat along the falciform ligament. Electronically Signed   By: Lorin Picket M.D.   On: 03/03/2021 08:53   CT Angio Chest PE W and/or Wo Contrast  Result Date: 03/03/2021 CLINICAL DATA:  Chest pain. EXAM: CT ANGIOGRAPHY CHEST WITH CONTRAST TECHNIQUE: Multidetector CT imaging of the chest was performed using the standard protocol during bolus administration of intravenous contrast. Multiplanar CT image reconstructions and MIPs were obtained to evaluate the vascular anatomy. CONTRAST:  154mL OMNIPAQUE IOHEXOL 350 MG/ML SOLN COMPARISON:  October 10, 2018. FINDINGS: Cardiovascular: Satisfactory opacification of the pulmonary arteries to the segmental level. No evidence of pulmonary embolism. Normal heart size. No pericardial effusion. Mediastinum/Nodes: No enlarged mediastinal, hilar, or axillary lymph nodes. Thyroid gland, trachea, and esophagus demonstrate no significant findings. Lungs/Pleura: No pneumothorax or pleural effusion is noted. Left lung is clear. Mild right lower lobe subsegmental atelectasis is noted. Upper Abdomen: Status post gastric bypass. Musculoskeletal: No chest wall abnormality. No acute or significant osseous findings. Review of the MIP images confirms the above findings. IMPRESSION: No definite evidence of pulmonary embolus. Mild right lower lobe subsegmental atelectasis. Electronically Signed   By: Marijo Conception M.D.   On: 03/03/2021 10:54   CT ABDOMEN PELVIS W CONTRAST  Result Date: 03/03/2021 CLINICAL DATA:  55 year old female with abdominal pain. Difficult to exclude draining abscess from left lower quadrant ileostomy site on noncontrast study earlier today. EXAM: CT ABDOMEN AND PELVIS  WITH CONTRAST TECHNIQUE: Multidetector CT imaging of the abdomen and pelvis was performed using the standard protocol following bolus administration of intravenous contrast. CONTRAST:  138mL OMNIPAQUE IOHEXOL 350 MG/ML SOLN COMPARISON:  CT Abdomen and Pelvis without contrast 0825 hours. CTA chest reported separately. FINDINGS: Lower chest: Reported separately today. Hepatobiliary: Diminutive or absent gallbladder.  Negative liver. Pancreas: Negative. Spleen: Negative. Adrenals/Urinary Tract: Negative. Pelvic phleboliths. No urinary calculus or obstructive uropathy. Stomach/Bowel: Redundant but largely decompressed large bowel from the splenic flexure distally. Gas-filled transverse colon also contains a small volume of fluid. Mostly decompressed right colon. Decompressed terminal ileum. Small bowel anastomosis in the right mid abdomen on series 2, image 65 with no adverse features. Sequelae of ileostomy take down suspected at the left abdominal wall although a bag remains in place. But no obvious ileostomy or colostomy loop can be identified. Moderate inflammatory stranding there, and thin mildly complex fluid collection tracking inferiorly and laterally in the subcutaneous space on series 2, image 71 (about 1.8 cm thickness and roughly 14 cm in length). Early rim enhancement there. No soft tissue gas. Sequelae of gastric bypass also with gastrojejunostomy. Small volume of fluid in the bypassed portion of the stomach. No free air or free fluid. Vascular/Lymphatic: Major vascular structures in the abdomen and pelvis appear patent. No lymphadenopathy. Reproductive: Absent uterus.  Diminutive or absent ovaries. Other: No pelvic free fluid. Musculoskeletal: Lower thoracic spinal stimulator device and previous lumbosacral fusion again noted. Severe right SI joint degeneration. No acute osseous abnormality identified. IMPRESSION: 1. Postoperative changes and inflammation underlying the left abdominal ostomy bag, but no  ostomy bowel loop can be delineated. Suspect this reflects a recent ostomy take-down, correlate with surgical history. In addition to inflammatory stranding in the ostomy region there is an elongated 1-2 cm thick fluid collection tracking about 14 cm into the left lateral and lower abdominal wall with early rim enhancement suspicious for abscess. 2. No bowel dilatation or obstruction. No free air or free fluid. Previous  gastrojejunostomy also. 3. Chest CTA reported separately. Electronically Signed   By: Genevie Ann M.D.   On: 03/03/2021 10:23   DG Chest Portable 1 View  Result Date: 03/03/2021 CLINICAL DATA:  55 year old female with cough. EXAM: PORTABLE CHEST 1 VIEW COMPARISON:  CTA chest 10/10/2018 and earlier. FINDINGS: Portable AP upright view at 0811 hours. Large body habitus. Lordotic positioning. Chronic thoracic spinal stimulator. Right PICC line in place. Mediastinal contours are stable and within normal limits. Allowing for portable technique the lungs are clear. No pneumothorax or pleural effusion. No acute osseous abnormality identified. Paucity of bowel gas in the upper abdomen. IMPRESSION: Large body habitus and lordotic positioning. No acute cardiopulmonary abnormality identified. Electronically Signed   By: Genevie Ann M.D.   On: 03/03/2021 08:19   [place] IMAGE GUIDED DRAINAGE BY PERCUTANEOUS CATHETER  Result Date: 03/03/2021 INDICATION: 55 year old woman with postsurgical fluid collection of the subcutaneous region of the left lower quadrant presents to IR for imaging guided drain placement. EXAM: Ultrasound-guided left lower quadrant abscess drain placement MEDICATIONS: The patient is currently admitted to the hospital and receiving intravenous antibiotics. The antibiotics were administered within an appropriate time frame prior to the initiation of the procedure. ANESTHESIA/SEDATION: Fentanyl 100 mcg IV; Versed 2 mg IV Moderate Sedation Time:  12 minutes The patient was continuously monitored during  the procedure by the interventional radiology nurse under my direct supervision. COMPLICATIONS: None immediate. PROCEDURE: Informed written consent was obtained from the patient after a thorough discussion of the procedural risks, benefits and alternatives. All questions were addressed. Maximal Sterile Barrier Technique was utilized including caps, mask, sterile gowns, sterile gloves, sterile drape, hand hygiene and skin antiseptic. A timeout was performed prior to the initiation of the procedure. Patient positioned supine on the procedure table. Ultrasound evaluation of the left lower quadrant demonstrated large lenticular fluid collection. The overlying skin was prepped and draped in usual fashion. Following local lidocaine administration, 17 gauge introducer needle was advanced into the left lower quadrant fluid collection utilizing continuous ultrasound guidance. Sterile ultrasound probe cover and gel utilized throughout the procedure. Purulent material was noted returning from the needle hub. The needle was exchanged for 10 [place] multipurpose pigtail drain over 0.035 inch guidewire. 100 mL of Purulent material was aspirated. Samples were sent for Gram stain and culture. Drain secured to skin with suture and connected to bulb suction. IMPRESSION: 10 French drain placed in subcutaneous abscess of left lower quadrant of the abdomen. Electronically Signed   By: Miachel Roux M.D.   On: 03/03/2021 16:26   Subjective: No change in physical exam and diagnosis from HPI  Discharge Exam: Vitals:   03/03/21 1830 03/03/21 1909  BP: 99/61 102/67  Pulse: (!) 108 (!) 104  Resp: (!) 33 20  Temp:  97.7 F (36.5 C)  SpO2: 96% 96%   Vitals:   03/03/21 1730 03/03/21 1800 03/03/21 1830 03/03/21 1909  BP: 106/70 100/70 99/61 102/67  Pulse: (!) 106 (!) 111 (!) 108 (!) 104  Resp: (!) 33 (!) 26 (!) 33 20  Temp:    97.7 F (36.5 C)  TempSrc:      SpO2: 91% 91% 96% 96%  Weight:      Height:       General: Pt is  alert, awake, not in acute distress Cardiovascular: Elevated heart rate that is regular rhythm, S1/S2 +, no rubs, no gallops Respiratory: CTA bilaterally, no wheezing, no rhonchi Abdominal: Morbidly obese abdomen, soft, generalized abdominal tenderness present, ostomy wound with  purulence present Extremities: no edema, no cyanosis  The results of significant diagnostics from this hospitalization (including imaging, microbiology, ancillary and laboratory) are listed below for reference.    Microbiology: Recent Results (from the past 240 hour(s))  Resp Panel by RT-PCR (Flu A&B, Covid) Nasopharyngeal Swab     Status: None   Collection Time: 03/03/21  8:39 AM   Specimen: Nasopharyngeal Swab; Nasopharyngeal(NP) swabs in vial transport medium  Result Value Ref Range Status   SARS Coronavirus 2 by RT PCR NEGATIVE NEGATIVE Final    Comment: (NOTE) SARS-CoV-2 target nucleic acids are NOT DETECTED.  The SARS-CoV-2 RNA is generally detectable in upper respiratory specimens during the acute phase of infection. The lowest concentration of SARS-CoV-2 viral copies this assay can detect is 138 copies/mL. A negative result does not preclude SARS-Cov-2 infection and should not be used as the sole basis for treatment or other patient management decisions. A negative result may occur with  improper specimen collection/handling, submission of specimen other than nasopharyngeal swab, presence of viral mutation(s) within the areas targeted by this assay, and inadequate number of viral copies(<138 copies/mL). A negative result must be combined with clinical observations, patient history, and epidemiological information. The expected result is Negative.  Fact Sheet for Patients:  EntrepreneurPulse.com.au  Fact Sheet for Healthcare Providers:  IncredibleEmployment.be  This test is no t yet approved or cleared by the [place] FDA and  has been authorized for  detection and/or diagnosis of SARS-CoV-2 by FDA under an Emergency Use Authorization (EUA). This EUA will remain  in effect (meaning this test can be used) for the duration of the COVID-19 declaration under Section 564(b)(1) of the Act, 21 U.S.C.section 360bbb-3(b)(1), unless the authorization is terminated  or revoked sooner.       Influenza A by PCR NEGATIVE NEGATIVE Final   Influenza B by PCR NEGATIVE NEGATIVE Final    Comment: (NOTE) The Xpert Xpress SARS-CoV-2/FLU/RSV plus assay is intended as an aid in the diagnosis of influenza from Nasopharyngeal swab specimens and should not be used as a sole basis for treatment. Nasal washings and aspirates are unacceptable for Xpert Xpress SARS-CoV-2/FLU/RSV testing.  Fact Sheet for Patients: EntrepreneurPulse.com.au  Fact Sheet for Healthcare Providers: IncredibleEmployment.be  This test is not yet approved or cleared by the [place] FDA and has been authorized for detection and/or diagnosis of SARS-CoV-2 by FDA under an Emergency Use Authorization (EUA). This EUA will remain in effect (meaning this test can be used) for the duration of the COVID-19 declaration under Section 564(b)(1) of the Act, 21 U.S.C. section 360bbb-3(b)(1), unless the authorization is terminated or revoked.  Performed at [facility], [address] [place], [place] [address]   Aerobic/Anaerobic Culture w Gram Stain (surgical/deep wound)     Status: None (Preliminary result)   Collection Time: 03/03/21  4:11 PM   Specimen: Abscess  Result Value Ref Range Status   Specimen Description   Final    ABSCESS Performed at [facility], [address] [place], [place] [address]    Special Requests   Final    LLQ SUBQ Performed at [facility], [place] [address] [place], [place] [address]    Gram Stain PENDING  Incomplete   Culture PENDING  Incomplete   Report Status PENDING  Incomplete     Labs: BNP (last 3 results) No results for input(s): BNP in the last 8760 hours. Basic Metabolic Panel: Recent Labs  Lab 03/03/21 0839  NA 137  K  3.3*  CL 102  CO2 26  GLUCOSE 126*  BUN 13  CREATININE 0.77  CALCIUM 8.6*  MG 1.9   Liver Function Tests: Recent Labs  Lab 03/03/21 0839  AST 36  ALT 33  ALKPHOS 66  BILITOT 0.9  PROT 6.1*  ALBUMIN 1.6*   CBC: Recent Labs  Lab 03/03/21 0839  WBC 13.6*  NEUTROABS 10.2*  HGB 8.8*  HCT 26.8*  MCV 87.3  PLT 384   Anemia work up No results for input(s): VITAMINB12, FOLATE, FERRITIN, TIBC, IRON, RETICCTPCT in the last 72 hours. Urinalysis    Component Value Date/Time   COLORURINE YELLOW (A) 03/03/2021 1706   APPEARANCEUR CLEAR (A) 03/03/2021 1706   APPEARANCEUR Hazy 10/31/2014 0731   LABSPEC 1.040 (H) 03/03/2021 1706   LABSPEC 1.013 10/31/2014 0731   PHURINE 5.0 03/03/2021 1706   GLUCOSEU NEGATIVE 03/03/2021 1706   GLUCOSEU Negative 10/31/2014 0731   HGBUR NEGATIVE 03/03/2021 1706   BILIRUBINUR NEGATIVE 03/03/2021 1706   BILIRUBINUR neg 01/16/2020 0856   BILIRUBINUR Negative 10/31/2014 0731   KETONESUR NEGATIVE 03/03/2021 1706   PROTEINUR NEGATIVE 03/03/2021 1706   UROBILINOGEN 0.2 01/16/2020 0856   NITRITE NEGATIVE 03/03/2021 1706   LEUKOCYTESUR TRACE (A) 03/03/2021 1706   LEUKOCYTESUR 1+ 10/31/2014 0731   Microbiology Recent Results (from the past 240 hour(s))  Resp Panel by RT-PCR (Flu A&B, Covid) Nasopharyngeal Swab     Status: None   Collection Time: 03/03/21  8:39 AM   Specimen: Nasopharyngeal Swab; Nasopharyngeal(NP) swabs in vial transport medium  Result Value Ref Range Status   SARS Coronavirus 2 by RT PCR NEGATIVE NEGATIVE Final    Comment: (NOTE) SARS-CoV-2 target nucleic acids are NOT DETECTED.  The SARS-CoV-2 RNA is generally detectable in upper respiratory specimens during the acute phase of infection. The lowest concentration of SARS-CoV-2 viral copies this assay can detect is 138  copies/mL. A negative result does not preclude SARS-Cov-2 infection and should not be used as the sole basis for treatment or other patient management decisions. A negative result may occur with  improper specimen collection/handling, submission of specimen other than nasopharyngeal swab, presence of viral mutation(s) within the areas targeted by this assay, and inadequate number of viral copies(<138 copies/mL). A negative result must be combined with clinical observations, patient history, and epidemiological information. The expected result is Negative.  Fact Sheet for Patients:  EntrepreneurPulse.com.au  Fact Sheet for Healthcare Providers:  IncredibleEmployment.be  This test is no t yet approved or cleared by the [place] FDA and  has been authorized for detection and/or diagnosis of SARS-CoV-2 by FDA under an Emergency Use Authorization (EUA). This EUA will remain  in effect (meaning this test can be used) for the duration of the COVID-19 declaration under Section 564(b)(1) of the Act, 21 U.S.C.section 360bbb-3(b)(1), unless the authorization is terminated  or revoked sooner.       Influenza A by PCR NEGATIVE NEGATIVE Final   Influenza B by PCR NEGATIVE NEGATIVE Final    Comment: (NOTE) The Xpert Xpress SARS-CoV-2/FLU/RSV plus assay is intended as an aid in the diagnosis of influenza from Nasopharyngeal swab specimens and should not be used as a sole basis for treatment. Nasal washings and aspirates are unacceptable for Xpert Xpress SARS-CoV-2/FLU/RSV testing.  Fact Sheet for Patients: EntrepreneurPulse.com.au  Fact Sheet for Healthcare Providers: IncredibleEmployment.be  This test is not yet approved or cleared by the [place] FDA and has been authorized for detection and/or diagnosis of SARS-CoV-2 by FDA under an Emergency Use  Authorization (EUA). This EUA will remain in effect (meaning  this test can be used) for the duration of the COVID-19 declaration under Section 564(b)(1) of the Act, 21 U.S.C. section 360bbb-3(b)(1), unless the authorization is terminated or revoked.  Performed at [facility], [address] [place], [place] [address]   Aerobic/Anaerobic Culture w Gram Stain (surgical/deep wound)     Status: None (Preliminary result)   Collection Time: 03/03/21  4:11 PM   Specimen: Abscess  Result Value Ref Range Status   Specimen Description   Final    ABSCESS Performed at [facility], [address], [place], [place] [address]    Special Requests   Final    LLQ SUBQ Performed at [facility], [place] [address], [place], [place] [address]    Gram Stain PENDING  Incomplete   Culture PENDING  Incomplete   Report Status PENDING  Incomplete   Time coordinating discharge: Over 15 minutes  SIGNED:  Criss Alvine, MD  [facility] 03/03/2021, 11:15 PM

## 2021-03-03 NOTE — ED Provider Notes (Signed)
[facility] Emergency Department Provider Note  ____________________________________________   Event Date/Time   First MD Initiated Contact with Patient 03/03/21 [id number]     (approximate)  I have reviewed the triage vital signs and the nursing notes.   HISTORY  Chief Complaint No chief complaint on file.   HPI Kimberly Cowan is a 55 y.o. female with past medical history of anemia, anxiety, DVT on subcu heparin, chronic back pain with nerve stimulator in place, obesity and recent cholecystectomy 5/13 previously dependent on PEG tube which was recently removed although patient is not sure exactly when this happened with subsequent placement of a PICC line who presents for assessment of some worsening pain on the medial side of the back that she states was attached after the tube was removed as well as worsening pain in her lower posterior back.  Patient thinks she has an infection of her skin on her left back because she was placed improperly on the surgery table during her recent cholecystectomy.  She also states she has had a cough for 2 days.  She endorses a little bit of diarrhea and some nausea but no vomiting, headache or earache, sore throat, chest pain, upper back pain, urinary symptoms, rash or recent falls.  She states she had some skin irritation on the lateral side of the previous feeding tube site with bag placed over it but this is gotten much better.  She states she was previously on antibiotics to treat infection at the feeding tube site but she is not currently on these and is not sure when it was stopped.  States she has had some leaking from her colostomy site of the last couple days.  Home nurses come twice to see her at home since she was last seen at [facility] but her colostomy bag has not been changed.         Past Medical History:  Diagnosis Date  . Allergy 1999   SEASONAL  . Anemia   . Anxiety   . Arm pain, left 05/17/2018  .  Arthritis    lower spine  . Bilateral lumbar radiculopathy   . Chronic back pain   . Chronic GERD 03/03/2021  . Clinical depression 04/04/2012  . Depression   . DVT (deep venous thrombosis) ([place])   . Encounter for screening colonoscopy   . Glaucoma    RIGHT EYE  . Headache(784.0)    migraines  . Major depressive disorder with single episode, in full remission ([place]) 08/15/2018  . Ovarian cyst   . Pneumonia    walking pneumonia  . Sleep apnea    no cpcap     last sleep study > 4 yrs    Patient Active Problem List   Diagnosis Date Noted  . Sepsis ([place]) 03/03/2021  . Chronic GERD 03/03/2021  . Status post hysterectomy 11/05/2020  . Primary insomnia 12/28/2019  . Mild peripheral edema 11/15/2017  . Perforated ear drum, left 06/25/2017  . History of pulmonary embolism 06/10/2017  . Environmental and seasonal allergies 04/07/2017  . Tinea versicolor 02/03/2017  . Vasomotor symptoms due to menopause 11/11/2016  . Morbid obesity ([place]) 09/10/2016  . B12 nutritional deficiency 08/05/2016  . OSA on CPAP 03/04/2016  . Bilateral lumbar radiculopathy 08/14/2015  . Status post gastric bypass for obesity 07/04/2015  . Prediabetes 07/01/2015  . Headache, migraine 04/04/2012    Past Surgical History:  Procedure Laterality Date  . ABDOMINAL HYSTERECTOMY  10/2006  . BACK SURGERY  2014  lumbar fusion  . BACK SURGERY  2009   discectomy  . BARIATRIC SURGERY  07/2020   Revision  . Pulcifer  . COLONOSCOPY WITH PROPOFOL N/A 04/12/2018   Procedure: COLONOSCOPY WITH PROPOFOL;  Surgeon: Lin Landsman, MD;  Location: [facility] ENDOSCOPY;  Service: Gastroenterology;  Laterality: N/A;  . GASTRIC BYPASS  2005  . KNEE ARTHROSCOPY WITH LATERAL MENISECTOMY Left 05/27/2017   Procedure: KNEE ARTHROSCOPY WITH LATERAL MENISECTOMY;  Surgeon: Hessie Knows, MD;  Location: [facility] ORS;  Service: Orthopedics;  Laterality: Left;  . KNEE ARTHROSCOPY WITH LATERAL RELEASE Left 05/27/2017    Procedure: KNEE ARTHROSCOPY WITH LATERAL RELEASE;  Surgeon: Hessie Knows, MD;  Location: [facility] ORS;  Service: Orthopedics;  Laterality: Left;  . LAPAROSCOPIC SALPINGO OOPHERECTOMY Bilateral 10/05/2016   Procedure: LAPAROSCOPIC SALPINGO OOPHORECTOMY;  Surgeon: Brayton Mars, MD;  Location: [facility] ORS;  Service: Gynecology;  Laterality: Bilateral;  . REDUCTION MAMMAPLASTY Bilateral    2001  . SPINAL CORD STIMULATOR INSERTION N/A 03/20/2016   Procedure: LUMBAR SPINAL CORD STIMULATOR INSERTION;  Surgeon: Eustace Moore, MD;  Location: [facility] NEURO ORS;  Service: Neurosurgery;  Laterality: N/A;  . SPINE SURGERY  2009 2014 2017  . TONSILLECTOMY      Prior to Admission medications   Medication Sig Start Date End Date Taking? Authorizing Provider  cyclobenzaprine (FLEXERIL) 10 MG tablet Take 10 mg by mouth daily as needed for muscle spasms.   Yes [provider]  latanoprost (XALATAN) 0.005 % ophthalmic solution Place 1 drop into both eyes at bedtime.    Yes [provider]  oxyCODONE-acetaminophen (PERCOCET/ROXICET) 5-325 MG tablet Take 1 tablet by mouth every 6 (six) hours as needed for moderate pain.   Yes [provider]  pantoprazole (PROTONIX) 40 MG tablet Take 40 mg by mouth daily.   Yes [provider]  rizatriptan (MAXALT-MLT) 10 MG disintegrating tablet DISSOLVE 1 TABLET BY MOUTH AS NEEDED FOR MIGRAINE- MAY REPEAT IN 2 HOURS AS NEEDED Patient taking differently: Take 10 mg by mouth daily as needed for migraine. (repeat after 2 hours if needed) 11/25/20  Yes Glean Hess, MD  Suvorexant (BELSOMRA) 20 MG TABS Take 1 tablet by mouth at bedtime as needed. Patient taking differently: Take 20 mg by mouth at bedtime. 02/03/21  Yes Glean Hess, MD  cyanocobalamin (,VITAMIN B-12,) 1000 MCG/ML injection ADMINISTER 1 ML(1000 MCG) IN THE MUSCLE EVERY 30 DAYS Patient not taking: No sig reported 02/17/21   Glean Hess, MD  fluticasone [facility]) 50 MCG/ACT  nasal spray SHAKE LIQUID AND USE 2 SPRAYS IN [facility] NOSTRIL DAILY Patient not taking: No sig reported 09/24/20   Glean Hess, MD  levocetirizine (XYZAL) 5 MG tablet TAKE 1 TABLET(5 MG) BY MOUTH EVERY EVENING Patient not taking: No sig reported 04/09/20   Glean Hess, MD    Allergies Morphine and related, Penicillins, Latex, and Baclofen  Family History  Problem Relation Age of Onset  . Diabetes Mother   . Hypertension Mother   . Obesity Mother   . Diabetes Sister   . Hypertension Sister   . Hypertension Father   . Obesity Daughter   . Obesity Sister   . Obesity Sister   . Breast cancer Neg Hx     Social History Social History   Tobacco Use  . Smoking status: Never Smoker  . Smokeless tobacco: Never Used  . Tobacco comment: smoking cessation materials not required  Vaping Use  . Vaping Use: Never used  Substance Use Topics  . Alcohol use: No    Alcohol/week: 0.0 standard drinks  . Drug use: No    Review of Systems  Review of Systems  Constitutional: Negative for chills and fever.  HENT: Negative for sore throat.   Eyes: Negative for pain.  Respiratory: Positive for cough. Negative for stridor.   Cardiovascular: Negative for chest pain.  Gastrointestinal: Positive for abdominal pain. Negative for vomiting.  Musculoskeletal: Positive for back pain.  Skin: Negative for rash.  Neurological: Negative for seizures, loss of consciousness and headaches.  Psychiatric/Behavioral: Negative for suicidal ideas.  All other systems reviewed and are negative.     ____________________________________________   PHYSICAL EXAM:  VITAL SIGNS: ED Triage Vitals [03/03/21 0724]  Enc Vitals Group     BP 92/74     Pulse Rate 100     Resp 20     Temp 98.5 F (36.9 C)     Temp Source Oral     SpO2 100 %     Weight 272 lb (123.4 kg)     Height 5\' 7"  (1.702 m)     Head Circumference      Peak Flow      Pain Score 8     Pain Loc      Pain Edu?      Excl. in [place]?     Vitals:   03/03/21 1100 03/03/21 1130  BP: 120/73 115/75  Pulse: (!) 111 (!) 110  Resp: (!) 36 (!) 36  Temp:    SpO2: 95% 91%   Physical Exam Vitals and nursing note reviewed.  Constitutional:      General: She is not in acute distress.    Appearance: She is well-developed. She is obese. She is ill-appearing.  HENT:     Head: Normocephalic and atraumatic.     Right Ear: External ear normal.     Left Ear: External ear normal.  Eyes:     Conjunctiva/sclera: Conjunctivae normal.  Cardiovascular:     Rate and Rhythm: Normal rate and regular rhythm.     Heart sounds: No murmur heard.   Pulmonary:     Effort: Pulmonary effort is normal. No respiratory distress.     Breath sounds: Normal breath sounds.  Abdominal:     Palpations: Abdomen is soft.     Tenderness: There is no abdominal tenderness.  Musculoskeletal:     Cervical back: Neck supple.  Skin:    General: Skin is warm and dry.  Neurological:     Mental Status: She is alert and oriented to person, place, and time.  Psychiatric:        Mood and Affect: Mood normal.     Patient has colostomy bag in place.  On the medial side there is some surrounding induration erythema and significant tenderness less on the lateral superior and inferior aspects.  Remainder of abdomen is relatively soft.  Patient's left lower back she has some erythema, induration, and significant tenderness as well as warmth in the left lower side.  There are several incision sites noted known actively draining purulent.  No fluctuance.  Midline is unremarkable.  Right lower back is unremarkable. ____________________________________________   LABS (all labs ordered are listed, but only abnormal results are displayed)  Labs Reviewed  CBC WITH DIFFERENTIAL/PLATELET - Abnormal; Notable for the following components:      Result Value   WBC 13.6 (*)    RBC 3.07 (*)    Hemoglobin 8.8 (*)  HCT 26.8 (*)    RDW 16.6 (*)    Neutro Abs 10.2 (*)     Monocytes Absolute 1.3 (*)    Abs Immature Granulocytes 0.18 (*)    All other components within normal limits  COMPREHENSIVE METABOLIC PANEL - Abnormal; Notable for the following components:   Potassium 3.3 (*)    Glucose, Bld 126 (*)    Calcium 8.6 (*)    Total Protein 6.1 (*)    Albumin 1.6 (*)    All other components within normal limits  RESP PANEL BY RT-PCR (FLU A&B, COVID) ARPGX2  CULTURE, BLOOD (ROUTINE X 2)  CULTURE, BLOOD (ROUTINE X 2)  URINE CULTURE  LACTIC ACID, PLASMA  MAGNESIUM  LACTIC ACID, PLASMA  PROTIME-INR  APTT  URINALYSIS, COMPLETE (UACMP) WITH MICROSCOPIC  HIV ANTIBODY (ROUTINE TESTING W REFLEX)   ____________________________________________  EKG  Sinus tachycardia ventricular of 118, normal axis, unremarkable intervals without evidence of acute ischemia. ____________________________________________  RADIOLOGY  ED MD interpretation: X-rays no large effusion, edema, pneumothorax or other clear acute intrathoracic process.  CTA chest shows no evidence of PE, pneumonia but some atelectasis.  No other clear acute thoracic process.  CT abdomen pelvis initially obtained without contrast shows some subcu emphysema around PEG tube fistula site.  There is also evidence of steatosis.  CT abdomen pelvis obtained with contrast shows likely abdominal wall abscess without SBO.  Official radiology report(s): CT ABDOMEN PELVIS WO CONTRAST  Result Date: 03/03/2021 CLINICAL DATA:  Abdominal pain.  Leaking colostomy bag. EXAM: CT ABDOMEN AND PELVIS WITHOUT CONTRAST TECHNIQUE: Multidetector CT imaging of the abdomen and pelvis was performed following the standard protocol without IV contrast. COMPARISON:  08/31/2016. FINDINGS: Lower chest: Chronic appearing volume loss in the right middle and right lower lobes, adjacent to an elevated right hemidiaphragm. Heart size normal. No pericardial or pleural effusion. Distal esophagus is unremarkable. Hepatobiliary: Liver is decreased in  attenuation diffusely. Heterogeneous area of decreased attenuation along the falciform ligament may be due to additional fat deposition. Liver is minimally enlarged, 18.1 cm. Cholecystectomy. No unexpected biliary ductal dilatation. Pancreas: Negative. Spleen: Negative. Adrenals/Urinary Tract: Adrenal glands and kidneys are unremarkable. Ureters are decompressed. There may be slight ventral bladder wall thickening. Stomach/Bowel: Gastric bypass. Left lower quadrant ileostomy. Associated subcutaneous edema and possible scattered fluid. Colon is unremarkable. Vascular/Lymphatic: Vascular structures are unremarkable. No pathologically enlarged lymph nodes. Reproductive: Uterus is visualized.  No adnexal mass. Other: No free fluid.  Mesenteries and peritoneum are unremarkable. Musculoskeletal: Postoperative and degenerative changes in the spine. Spinal stimulator wires are seen in the thoracic spine. IMPRESSION: 1. Left lower quadrant ileostomy. Associated subcutaneous edema and scattered fluid. Difficult to exclude a draining abscess. Consider repeat imaging with IV contrast, as clinically indicated. 2. Enlarged steatotic liver with probable focal fat along the falciform ligament. Electronically Signed   By: Lorin Picket M.D.   On: 03/03/2021 08:53   CT Angio Chest PE W and/or Wo Contrast  Result Date: 03/03/2021 CLINICAL DATA:  Chest pain. EXAM: CT ANGIOGRAPHY CHEST WITH CONTRAST TECHNIQUE: Multidetector CT imaging of the chest was performed using the standard protocol during bolus administration of intravenous contrast. Multiplanar CT image reconstructions and MIPs were obtained to evaluate the vascular anatomy. CONTRAST:  160mL OMNIPAQUE IOHEXOL 350 MG/ML SOLN COMPARISON:  October 10, 2018. FINDINGS: Cardiovascular: Satisfactory opacification of the pulmonary arteries to the segmental level. No evidence of pulmonary embolism. Normal heart size. No pericardial effusion. Mediastinum/Nodes: No enlarged  mediastinal, hilar, or axillary lymph nodes. Thyroid  gland, trachea, and esophagus demonstrate no significant findings. Lungs/Pleura: No pneumothorax or pleural effusion is noted. Left lung is clear. Mild right lower lobe subsegmental atelectasis is noted. Upper Abdomen: Status post gastric bypass. Musculoskeletal: No chest wall abnormality. No acute or significant osseous findings. Review of the MIP images confirms the above findings. IMPRESSION: No definite evidence of pulmonary embolus. Mild right lower lobe subsegmental atelectasis. Electronically Signed   By: Marijo Conception M.D.   On: 03/03/2021 10:54   CT ABDOMEN PELVIS W CONTRAST  Result Date: 03/03/2021 CLINICAL DATA:  55 year old female with abdominal pain. Difficult to exclude draining abscess from left lower quadrant ileostomy site on noncontrast study earlier today. EXAM: CT ABDOMEN AND PELVIS WITH CONTRAST TECHNIQUE: Multidetector CT imaging of the abdomen and pelvis was performed using the standard protocol following bolus administration of intravenous contrast. CONTRAST:  148mL OMNIPAQUE IOHEXOL 350 MG/ML SOLN COMPARISON:  CT Abdomen and Pelvis without contrast 0825 hours. CTA chest reported separately. FINDINGS: Lower chest: Reported separately today. Hepatobiliary: Diminutive or absent gallbladder.  Negative liver. Pancreas: Negative. Spleen: Negative. Adrenals/Urinary Tract: Negative. Pelvic phleboliths. No urinary calculus or obstructive uropathy. Stomach/Bowel: Redundant but largely decompressed large bowel from the splenic flexure distally. Gas-filled transverse colon also contains a small volume of fluid. Mostly decompressed right colon. Decompressed terminal ileum. Small bowel anastomosis in the right mid abdomen on series 2, image 65 with no adverse features. Sequelae of ileostomy take down suspected at the left abdominal wall although a bag remains in place. But no obvious ileostomy or colostomy loop can be identified. Moderate  inflammatory stranding there, and thin mildly complex fluid collection tracking inferiorly and laterally in the subcutaneous space on series 2, image 71 (about 1.8 cm thickness and roughly 14 cm in length). Early rim enhancement there. No soft tissue gas. Sequelae of gastric bypass also with gastrojejunostomy. Small volume of fluid in the bypassed portion of the stomach. No free air or free fluid. Vascular/Lymphatic: Major vascular structures in the abdomen and pelvis appear patent. No lymphadenopathy. Reproductive: Absent uterus.  Diminutive or absent ovaries. Other: No pelvic free fluid. Musculoskeletal: Lower thoracic spinal stimulator device and previous lumbosacral fusion again noted. Severe right SI joint degeneration. No acute osseous abnormality identified. IMPRESSION: 1. Postoperative changes and inflammation underlying the left abdominal ostomy bag, but no ostomy bowel loop can be delineated. Suspect this reflects a recent ostomy take-down, correlate with surgical history. In addition to inflammatory stranding in the ostomy region there is an elongated 1-2 cm thick fluid collection tracking about 14 cm into the left lateral and lower abdominal wall with early rim enhancement suspicious for abscess. 2. No bowel dilatation or obstruction. No free air or free fluid. Previous gastrojejunostomy also. 3. Chest CTA reported separately. Electronically Signed   By: Genevie Ann M.D.   On: 03/03/2021 10:23   DG Chest Portable 1 View  Result Date: 03/03/2021 CLINICAL DATA:  55 year old female with cough. EXAM: PORTABLE CHEST 1 VIEW COMPARISON:  CTA chest 10/10/2018 and earlier. FINDINGS: Portable AP upright view at 0811 hours. Large body habitus. Lordotic positioning. Chronic thoracic spinal stimulator. Right PICC line in place. Mediastinal contours are stable and within normal limits. Allowing for portable technique the lungs are clear. No pneumothorax or pleural effusion. No acute osseous abnormality identified.  Paucity of bowel gas in the upper abdomen. IMPRESSION: Large body habitus and lordotic positioning. No acute cardiopulmonary abnormality identified. Electronically Signed   By: Genevie Ann M.D.   On: 03/03/2021 08:19  ____________________________________________   PROCEDURES  Procedure(s) performed (including Critical Care):  .Critical Care Performed by: Lucrezia Starch, MD Authorized by: Lucrezia Starch, MD   Critical care provider statement:    Critical care time (minutes):  45   Critical care time was exclusive of:  Separately billable procedures and treating other patients   Critical care was necessary to treat or prevent imminent or life-threatening deterioration of the following conditions:  Sepsis   Critical care was time spent personally by me on the following activities:  Discussions with consultants, evaluation of patient's response to treatment, examination of patient, ordering and performing treatments and interventions, ordering and review of laboratory studies, ordering and review of radiographic studies, pulse oximetry, re-evaluation of patient's condition, obtaining history from patient or surrogate and review of old charts     ____________________________________________   INITIAL IMPRESSION / ASSESSMENT AND PLAN / ED COURSE     Patient presents with seemingly 3 separate related concerns first cough over the last 2 days, second worsening pain and redness on the medial side of ostomy bag was placed at the site of her previously J-tube feeding tube that was removed after her recent cholecystectomy and third for worsening pain and redness on her left lower back or she thinks she is developing a skin infection from recent surgery as this is a site where she had a nerve implant replaced and has been extremely painful since her surgery.  With regard to her cough initially chest x-ray is obtained.  This showed no evidence of acute volume overload or pneumonia.  Given  tachycardia and tachypnea CTA obtained.  CTA chest shows no evidence of PE, pneumonia but some atelectasis.  No other clear acute thoracic process.  Suspect cough may be related to bronchitis.  COVID and flu is negative.  With regard to her abdominal discomfort and concern she may have abdominal wall infection given her erythema tenderness and induration with history of recent infection in this area.  We will obtain a CT to assess for abdominal wall abscess.  In addition I am concerned that she may have a cellulitis versus deeper space infection in her lower back given induration erythema and tenderness here as well.  CT will also help delineate if she has possibly of deeper space infection in the left lower back.  CT abdomen pelvis initially obtained without contrast shows some subcu emphysema around PEG tube fistula site.  There is also evidence of steatosis.  CT abdomen pelvis obtained with contrast shows likely abdominal wall abscess without SBO.  CBC with WBC count of 13.6, hemoglobin of 8.8 without recent to compare to.  CMP remarkable for K of 3.3 without any other significant electrolyte or metabolic derangements.  Lactic acid nonelevated at 1.5.  Magnesium WNL.  Given tachycardia and leukocytosis concern for abdominal wall abscess patient treated for possible sepsis with IV fluids and broad-spectrum biotics.  Blood cultures obtained.  This will also cover for possible cellulitis in her left lower back.  Discussed patient with her surgeon at [facility] Dr. Gustavus Messing who agreed with IV antibiotics and stated he would work on securing patient in bed at [facility] and see tentatively was in agreement to accept the patient for transfer pending discussions with transfer center.  Unfortunately [facility] has no beds and they are unwilling to take the patient for emergency department to emergency department transfer..  They recommend admission here for IV antibiotics pending bed availability.  Discussed with  on-call surgeon here Dr. Lysle Pearl who will  see the patient and I will admit to medicine service here.      ____________________________________________   FINAL CLINICAL IMPRESSION(S) / ED DIAGNOSES  Final diagnoses:  Cough  Generalized abdominal pain  Acute left-sided low back pain without sciatica  Abdominal wall abscess  Sepsis, due to unspecified organism, unspecified whether acute organ dysfunction present ([place])  Cellulitis of lower back    Medications  vancomycin (VANCOCIN) IVPB 1000 mg/200 mL premix (0 mg Intravenous Stopped 03/03/21 1237)    And  vancomycin (VANCOREADY) IVPB 1500 mg/300 mL (1,500 mg Intravenous New Bag/Given 03/03/21 1236)  lactated ringers bolus 1,000 mL (0 mLs Intravenous Paused 03/03/21 1023)  potassium chloride 10 mEq in 100 mL IVPB (0 mEq Intravenous Stopped 03/03/21 1323)  lactated ringers infusion ( Intravenous New Bag/Given 03/03/21 1140)  heparin injection 5,000 Units (has no administration in time range)  metroNIDAZOLE (FLAGYL) IVPB 500 mg (500 mg Intravenous Not Given 03/03/21 1257)  acetaminophen (TYLENOL) tablet 1,000 mg (has no administration in time range)    Or  acetaminophen (TYLENOL) suppository 650 mg (has no administration in time range)  ondansetron (ZOFRAN) tablet 4 mg (has no administration in time range)    Or  ondansetron (ZOFRAN) injection 4 mg (has no administration in time range)  ciprofloxacin (CIPRO) IVPB 400 mg (has no administration in time range)  vancomycin (VANCOCIN) IVPB 1000 mg/200 mL premix (has no administration in time range)  HYDROmorphone (DILAUDID) injection 1 mg (has no administration in time range)  ketorolac (TORADOL) 30 MG/ML injection 30 mg (has no administration in time range)  lactated ringers bolus 1,000 mL (0 mLs Intravenous Stopped 03/03/21 1023)  HYDROmorphone (DILAUDID) injection 1 mg (1 mg Intravenous Given 03/03/21 0855)  metroNIDAZOLE (FLAGYL) IVPB 500 mg (0 mg Intravenous Stopped 03/03/21 1121)  ciprofloxacin  (CIPRO) IVPB 400 mg (0 mg Intravenous Stopped 03/03/21 1121)  iohexol (OMNIPAQUE) 350 MG/ML injection 100 mL (100 mLs Intravenous Contrast Given 03/03/21 0949)     ED Discharge Orders    None       Note:  This document was prepared using Dragon voice recognition software and may include unintentional dictation errors.   Lucrezia Starch, MD 03/03/21 1336

## 2021-03-03 NOTE — Consult Note (Signed)
PHARMACY -  BRIEF ANTIBIOTIC NOTE   Pharmacy has received consult(s) for vancomycin from an ED provider. The patient's profile has been reviewed for ht/wt/allergies/indication/available labs.    One time order(s) placed for vancomycin 2500 mg   Further antibiotics/pharmacy consults should be ordered by admitting physician if indicated.                       Thank you, Darnelle Bos, PharmD 03/03/2021  9:15 AM

## 2021-03-03 NOTE — Progress Notes (Signed)
Report given to [facility] RN at bedside post procedure/recovery.

## 2021-03-03 NOTE — H&P (Signed)
History and Physical   Kimberly Cowan PPJ:[id number] DOB: Feb 19, 1966 DOA: 03/03/2021  PCP: Glean Hess, MD  Outpatient Specialists: Dr. Gustavus Messing [facility]) Patient coming from: home  I have personally briefly reviewed patient's old medical records in [place].  Chief Concern: abdominal pain  HPI: Kimberly Cowan is a 55 y.o. female with medical history significant for morbid obesity, status post Roux-en-Y in 2005 with revision in August 21, 2020, hypertension, malnutrition status post PEG tube placement, status post PEG tube removal due to malfunction, on TPN via PICC placement, opioid dependence, obstructive sleep apnea on CPAP, history of pulmonary embolism, history of DVT.  She presents to the emergency department for chief concerns of abdominal pain.  She reports that the abdominal pain started approximately May 14.  She describes the pain as throbbing and persistent. She reports that she had a feeding tube removed on May 13 and the pain has been persistent ever since. She reports the dilaudid given in the ED improved her pain.   She endorsed that she had a fever on 03/02/2021 and T-max at home was 102.  She reports that she took 2 over-the-counter dose of Tylenol and this improved the fever.  She endorses nausea and no vomiting.  She denies trauma to the abdomen.  She endorses that the previous ostomy site has been purulence with redness and tenderness.  Social history: She currently lives by herself, daughter is 10 minutes away.  She denies tobacco, EtOH, recreational drug use.  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    Vaccinations: 4 doses of Pfizer    ROS: Constitutional: no weight change, + fever ENT/Mouth: no sore throat, no rhinorrhea Eyes: no eye pain, no vision changes Cardiovascular: no chest pain, no dyspnea,  + edema, no palpitations Respiratory: no cough, no sputum, no  wheezing Gastrointestinal: no nausea, no vomiting, no diarrhea, no constipation Genitourinary: no urinary incontinence, no dysuria, no hematuria Musculoskeletal: no arthralgias, no myalgias Skin: + skin lesions, no pruritus, + purulence drainage Neuro: + weakness, no loss of consciousness, no syncope Psych: no anxiety, no depression, + decrease appetite Heme/Lymph: no bruising, no bleeding  ED Course: Discussed with ED provider, patient requiring hospitalization for meeting sepsis criteria.  Initial vitals in the emergency department show temperature of 98.5, respiration rate of 20, heart rate 100, initial blood pressure was 92/74, SPO2 of 100% on room air.  Labs in the emergency department was remarkable for potassium 3.3, sodium 137, chloride 102, bicarb 26, nonfasting blood glucose 126, BUN 13, serum creatinine of 0.77, EGFR greater than 60.  Albumin 1.3, calcium 8.6.  WBC 13.6, hemoglobin 8.8, platelets 384.  Lactic acid 1.5.  COVID PCR/influenza A PCR/influenza B PCR were negative.  EDP gave 1 L lactated ringer bolus, started patient on lactated ringer IVF at 150 cc/h, broad-spectrum antibiotics with vancomycin, ciprofloxacin, metronidazole.  CT abdomen pelvis with contrast was ordered and was read as inflammatory stranding in the ostomy region there is an elongated 1 to 2 cm thick fluid collection tracking about 14 cm into the left lateral and lower abdominal wall with early rim enhancement suspicious for abscess.   No bowel dilatation or obstruction.  No free air or free fluid.  Previous gastrojejunostomy.  EDP called [facility] bariatric surgeon and bariatric surgeon agrees that patient needs to be admitted however with forced does not have a bed.  Patient has been accepted to [facility] pending bed.  ED to  ED transfer was attempted however not possible.  General surgeon on-call Dr. Lysle Pearl was consulted.  Assessment/Plan  Active Problems:   Headache, migraine   Status  post gastric bypass for obesity   Bilateral lumbar radiculopathy   Obstructive sleep apnea of adult   Morbid obesity ([place])   History of pulmonary embolism   Status post hysterectomy   Sepsis ([place])   Chronic GERD   # Abdominal pain secondary to abdominal abscess suspect # Meet sepsis criteria, with increased respiration rate, heart rate, WBC 13.6, fever at home (102) -Blood cultures x2 ordered and in process - Continue broad-spectrum antibiotic with vancomycin, ciprofloxacin, metronidazole - General surgery has been consulted and recommends IR consult for abscess drainage - [facility]ariatric surgeon has been called and they have accepted the patient however there is no beds available - ED to ED transfer was attempted by EDP and not possible at this time - Status post lactated ringer 1 L bolus and LR IVF at 150 mL/h - Patient is maintaining appropriate MAP with SBP at bedside greater than 120 - No clinical indication for further sepsis resuscitative effort at this time - General surgery recommends: Strict n.p.o., IR, TPN - TPN order has been placed and pharmacy is aware - Pain control: Status post Dilaudid 1 mg IV per EDP - Continued pain control effort: Dilaudid 1 mg  q4h prn for severe pain, ketorolac 30 mg IV every 6 hours as needed for moderate pain - Wound care has been consulted and images of her previous abdominal ostomy has been uploaded  # Left thoracic and left gluteal cellulitis -treat with broad-spectrum antibiotics as above -On physical exam patient had left thoracic and left superior gluteal redness and warmth -Clinical capture was attempted however upload was unsuccessful possibly technology error - As patient was in excruciating pain, a second attempt was not attempted  # Hypokalemia-present on admission and suspect secondary to malnutrition - Status post replacement, potassium 10 mill equivalent every hour, 6 doses ordered per EDP - CMP in the a.m.  # Moderate  anemia -  - Hemoglobin on 02/21/2021 was 11.4, 10.1 on 02/27/2021 - Hemoglobin on admission is 8.8/hematocrit 26.8 - Patient did not endorse any signs or symptoms of blood loss -Type and screen ordered -CBC in the a.m.  # Moderate protein-calorie malnutrition-present on admission - Dietitian has been consulted for TPN order - Pharmacy is aware of TPN order  # GERD- pantoprazole 40 mg IV daily ordered  # History of hypertension-currently normotensive and per chart review patient is no longer taking antihypertensive medications  # OSA on CPAP-CPAP nightly ordered  # Morbid obesity- continue outpatient follow-up  # History of multiple abdominal surgery- we will I in 2005, cesarean C-section, hysterectomy, Roux-en-Y revision in August 21, 2020  # Headache disorder/migraine-resumed nortriptyline 10 mg nightly  # Opioid dependence # Depression  # Neuropathy-query fibromyalgia -I have not resumed home gabapentin and p.o. opioid as patient is currently being treated with IV pain management and I do not want to put her at risk for decreased respiratory drive given multiple comorbid comorbidities including morbid obesity, sepsis, OSA on CPAP  # Malnutrition secondary to history of bariatric surgery- -Per [facility] chart review in care everywhere, patient takes cyanocobalamin 1000 mcg injection every 30 days, this has not been resumed -Lipase-protease-amylase 36,000- 114,000-180,000, 2 capsules by mouth 5 times a day, this has been resumed  # History of COVID-19 infection- 02/13/2021  # COVID PCR/influenza a and B PCR- resulted  as negative during this hospitalization  # History of pulmonary embolism and DVT-Heparin 5000 units every 8 hours ordered with first dose to be given at 03/03/2021 at 2300 -Per chart review patient had bilateral pulmonary embolism in 2018  # Atelectasis prevention- incentive spirometry every 1 hour, 5-10 reps each while awake, flutter valve 5-10 reps every 2 hours  while awake ordered - Respiratory team communication placed for education of proper incentive spirometry and flutter valve use  Extensive chart reviewed.   Patient presented on 02/13/2021 - 02/21/2021: at [facility] for malfunctioning of the jejunostomy tube with bile leak around tube at skin level.  Patient was admitted for PEG tube malfunction, was started on IV antibiotics.  Patient had increasing tenderness of the abdominal insertion site, J-tube study was done and showed suboptimal position with extravasation, patient was maintained n.p.o. at that time.  CT of the abdomen and pelvis at [facility] showed J-tube tip terminates in the subcutaneous tissue, PICC line was placed, TPN was started.  J-tube was removed due to malposition on day 6 of this hospitalization.  IR was consulted to place J-tube to control drainage however unclear as to why J-tube was not placed by IR at [facility] at that time.  PICC line placement: 02/17/2021  DVT prophylaxis: Heparin 5000 units every 8 hours Code Status: Full code Diet: strict NPO Family Communication: Updated daughter per patient's request, Kimberly Cowan Disposition Plan: Pending clinical course Consults called: General surgery, IR Admission status: Inpatient, MedSurg, telemetry ordered for 24 hours  Past Medical History:  Diagnosis Date  . Allergy 1999   SEASONAL  . Anemia   . Anxiety   . Arm pain, left 05/17/2018  . Arthritis    lower spine  . Bilateral lumbar radiculopathy   . Chronic back pain   . Chronic GERD 03/03/2021  . Clinical depression 04/04/2012  . Depression   . DVT (deep venous thrombosis) ([place])   . Encounter for screening colonoscopy   . Glaucoma    RIGHT EYE  . Headache(784.0)    migraines  . Major depressive disorder with single episode, in full remission ([place]) 08/15/2018  . Ovarian cyst   . Pneumonia    walking pneumonia  . Sleep apnea    no cpcap     last sleep study > 4 yrs   Past Surgical History:   Procedure Laterality Date  . ABDOMINAL HYSTERECTOMY  10/2006  . BACK SURGERY  2014   lumbar fusion  . BACK SURGERY  2009   discectomy  . BARIATRIC SURGERY  07/2020   Revision  . [place]  . COLONOSCOPY WITH PROPOFOL N/A 04/12/2018   Procedure: COLONOSCOPY WITH PROPOFOL;  Surgeon: Lin Landsman, MD;  Location: [facility] ENDOSCOPY;  Service: Gastroenterology;  Laterality: N/A;  . GASTRIC BYPASS  2005  . KNEE ARTHROSCOPY WITH LATERAL MENISECTOMY Left 05/27/2017   Procedure: KNEE ARTHROSCOPY WITH LATERAL MENISECTOMY;  Surgeon: Hessie Knows, MD;  Location: [facility] ORS;  Service: Orthopedics;  Laterality: Left;  . KNEE ARTHROSCOPY WITH LATERAL RELEASE Left 05/27/2017   Procedure: KNEE ARTHROSCOPY WITH LATERAL RELEASE;  Surgeon: Hessie Knows, MD;  Location: [facility] ORS;  Service: Orthopedics;  Laterality: Left;  . LAPAROSCOPIC SALPINGO OOPHERECTOMY Bilateral 10/05/2016   Procedure: LAPAROSCOPIC SALPINGO OOPHORECTOMY;  Surgeon: Brayton Mars, MD;  Location: [facility] ORS;  Service: Gynecology;  Laterality: Bilateral;  . REDUCTION MAMMAPLASTY Bilateral    2001  . SPINAL CORD STIMULATOR INSERTION N/A 03/20/2016   Procedure:  LUMBAR SPINAL CORD STIMULATOR INSERTION;  Surgeon: Eustace Moore, MD;  Location: [facility] NEURO ORS;  Service: Neurosurgery;  Laterality: N/A;  . SPINE SURGERY  2009 2014 2017  . TONSILLECTOMY     Social History:  reports that she has never smoked. She has never used smokeless tobacco. She reports that she does not drink alcohol and does not use drugs.  Allergies  Allergen Reactions  . Morphine And Related Hives, Shortness Of Breath, Nausea And Vomiting and Other (See Comments)    "can't breathe"  . Penicillins Anaphylaxis, Hives, Shortness Of Breath and Nausea And Vomiting    "can't breathe", Has patient had a PCN reaction causing immediate rash, facial/tongue/throat swelling, SOB or lightheadedness with hypotension: Yes Has patient had a PCN reaction causing  severe rash involving mucus membranes or skin necrosis: No Has patient had a PCN reaction that required hospitalization No Has patient had a PCN reaction occurring within the last 10 years: No If all of the above answers are "NO", then may proceed with Cephalosporin use.   . Latex Hives  . Baclofen Nausea Only    Jittery, anxious   Family History  Problem Relation Age of Onset  . Diabetes Mother   . Hypertension Mother   . Obesity Mother   . Diabetes Sister   . Hypertension Sister   . Hypertension Father   . Obesity Daughter   . Obesity Sister   . Obesity Sister   . Breast cancer Neg Hx    Family history: Family history reviewed and not pertinent  Prior to Admission medications   Medication Sig Start Date End Date Taking? Authorizing Provider  cyclobenzaprine (FLEXERIL) 10 MG tablet Take 10 mg by mouth daily as needed for muscle spasms.   Yes [provider]  latanoprost (XALATAN) 0.005 % ophthalmic solution Place 1 drop into both eyes at bedtime.    Yes [provider]  oxyCODONE-acetaminophen (PERCOCET/ROXICET) 5-325 MG tablet Take 1 tablet by mouth every 6 (six) hours as needed for moderate pain.   Yes [provider]  pantoprazole (PROTONIX) 40 MG tablet Take 40 mg by mouth daily.   Yes [provider]  rizatriptan (MAXALT-MLT) 10 MG disintegrating tablet DISSOLVE 1 TABLET BY MOUTH AS NEEDED FOR MIGRAINE- MAY REPEAT IN 2 HOURS AS NEEDED Patient taking differently: Take 10 mg by mouth daily as needed for migraine. (repeat after 2 hours if needed) 11/25/20  Yes Glean Hess, MD  Suvorexant (BELSOMRA) 20 MG TABS Take 1 tablet by mouth at bedtime as needed. Patient taking differently: Take 20 mg by mouth at bedtime. 02/03/21  Yes Glean Hess, MD  cyanocobalamin (,VITAMIN B-12,) 1000 MCG/ML injection ADMINISTER 1 ML(1000 MCG) IN THE MUSCLE EVERY 30 DAYS Patient not taking: No sig reported 02/17/21   Glean Hess, MD  fluticasone  [facility]) 50 MCG/ACT nasal spray SHAKE LIQUID AND USE 2 SPRAYS IN [facility] NOSTRIL DAILY Patient not taking: No sig reported 09/24/20   Glean Hess, MD  levocetirizine (XYZAL) 5 MG tablet TAKE 1 TABLET(5 MG) BY MOUTH EVERY EVENING Patient not taking: No sig reported 04/09/20   Glean Hess, MD   Physical Exam: Vitals:   03/03/21 1020 03/03/21 1030 03/03/21 1100 03/03/21 1130  BP: 126/73 120/74 120/73 115/75  Pulse: (!) 114 (!) 109 (!) 111 (!) 110  Resp: (!) 34 (!) 27 (!) 36 (!) 36  Temp:      TempSrc:      SpO2: 92% 93% 95% 91%  Weight:      Height:       Constitutional: appears age-appropriate, NAD, calm, comfortable Eyes: PERRL, lids and conjunctivae normal ENMT: Mucous membranes are moist. Posterior pharynx clear of any exudate or lesions. Age-appropriate dentition. Hearing appropriate Neck: normal, supple, no masses, no thyromegaly Respiratory: clear to auscultation bilaterally, no wheezing, no crackles. Normal respiratory effort. No accessory muscle use.  Cardiovascular: Regular rate and rhythm, no murmurs / rubs / gallops. No extremity edema. 2+ pedal pulses. No carotid bruits.  Abdomen: Morbid obese abdomen, + generalized tenderness, no masses palpated, no hepatosplenomegaly. Bowel sounds positive.  Musculoskeletal: no clubbing / cyanosis. No joint deformity upper and lower extremities. Good ROM, no contractures, no atrophy. Normal muscle tone. PICC line in RUExt in place and negative for signs of infection Skin: + rashes, + lesions documented per imaging uploaded into media.   Left thoracic and left gluteal redness and warmth, indicative of cellulitis - POA, clinical capture attempted, however upload failed due to technology  Neurologic: Sensation intact. Strength 5/5 in all 4.  Psychiatric: Normal judgment and insight. Alert and oriented x 3. Normal mood.   EKG: independently reviewed, showing sinus tachycardia 118, with QTC 473  Chest x-ray on Admission: I personally  reviewed and I agree with radiologist reading as below.  CT ABDOMEN PELVIS WO CONTRAST  Result Date: 03/03/2021 CLINICAL DATA:  Abdominal pain.  Leaking colostomy bag. EXAM: CT ABDOMEN AND PELVIS WITHOUT CONTRAST TECHNIQUE: Multidetector CT imaging of the abdomen and pelvis was performed following the standard protocol without IV contrast. COMPARISON:  08/31/2016. FINDINGS: Lower chest: Chronic appearing volume loss in the right middle and right lower lobes, adjacent to an elevated right hemidiaphragm. Heart size normal. No pericardial or pleural effusion. Distal esophagus is unremarkable. Hepatobiliary: Liver is decreased in attenuation diffusely. Heterogeneous area of decreased attenuation along the falciform ligament may be due to additional fat deposition. Liver is minimally enlarged, 18.1 cm. Cholecystectomy. No unexpected biliary ductal dilatation. Pancreas: Negative. Spleen: Negative. Adrenals/Urinary Tract: Adrenal glands and kidneys are unremarkable. Ureters are decompressed. There may be slight ventral bladder wall thickening. Stomach/Bowel: Gastric bypass. Left lower quadrant ileostomy. Associated subcutaneous edema and possible scattered fluid. Colon is unremarkable. Vascular/Lymphatic: Vascular structures are unremarkable. No pathologically enlarged lymph nodes. Reproductive: Uterus is visualized.  No adnexal mass. Other: No free fluid.  Mesenteries and peritoneum are unremarkable. Musculoskeletal: Postoperative and degenerative changes in the spine. Spinal stimulator wires are seen in the thoracic spine. IMPRESSION: 1. Left lower quadrant ileostomy. Associated subcutaneous edema and scattered fluid. Difficult to exclude a draining abscess. Consider repeat imaging with IV contrast, as clinically indicated. 2. Enlarged steatotic liver with probable focal fat along the falciform ligament. Electronically Signed   By: Lorin Picket M.D.   On: 03/03/2021 08:53   CT Angio Chest PE W and/or Wo  Contrast  Result Date: 03/03/2021 CLINICAL DATA:  Chest pain. EXAM: CT ANGIOGRAPHY CHEST WITH CONTRAST TECHNIQUE: Multidetector CT imaging of the chest was performed using the standard protocol during bolus administration of intravenous contrast. Multiplanar CT image reconstructions and MIPs were obtained to evaluate the vascular anatomy. CONTRAST:  140m OMNIPAQUE IOHEXOL 350 MG/ML SOLN COMPARISON:  October 10, 2018. FINDINGS: Cardiovascular: Satisfactory opacification of the pulmonary arteries to the segmental level. No evidence of pulmonary embolism. Normal heart size. No pericardial effusion. Mediastinum/Nodes: No enlarged mediastinal, hilar, or axillary lymph nodes. Thyroid gland, trachea, and esophagus demonstrate no significant findings. Lungs/Pleura: No pneumothorax or pleural effusion is noted. Left lung is clear. Mild  right lower lobe subsegmental atelectasis is noted. Upper Abdomen: Status post gastric bypass. Musculoskeletal: No chest wall abnormality. No acute or significant osseous findings. Review of the MIP images confirms the above findings. IMPRESSION: No definite evidence of pulmonary embolus. Mild right lower lobe subsegmental atelectasis. Electronically Signed   By: Marijo Conception M.D.   On: 03/03/2021 10:54   CT ABDOMEN PELVIS W CONTRAST  Result Date: 03/03/2021 CLINICAL DATA:  55 year old female with abdominal pain. Difficult to exclude draining abscess from left lower quadrant ileostomy site on noncontrast study earlier today. EXAM: CT ABDOMEN AND PELVIS WITH CONTRAST TECHNIQUE: Multidetector CT imaging of the abdomen and pelvis was performed using the standard protocol following bolus administration of intravenous contrast. CONTRAST:  118m OMNIPAQUE IOHEXOL 350 MG/ML SOLN COMPARISON:  CT Abdomen and Pelvis without contrast 0825 hours. CTA chest reported separately. FINDINGS: Lower chest: Reported separately today. Hepatobiliary: Diminutive or absent gallbladder.  Negative liver.  Pancreas: Negative. Spleen: Negative. Adrenals/Urinary Tract: Negative. Pelvic phleboliths. No urinary calculus or obstructive uropathy. Stomach/Bowel: Redundant but largely decompressed large bowel from the splenic flexure distally. Gas-filled transverse colon also contains a small volume of fluid. Mostly decompressed right colon. Decompressed terminal ileum. Small bowel anastomosis in the right mid abdomen on series 2, image 65 with no adverse features. Sequelae of ileostomy take down suspected at the left abdominal wall although a bag remains in place. But no obvious ileostomy or colostomy loop can be identified. Moderate inflammatory stranding there, and thin mildly complex fluid collection tracking inferiorly and laterally in the subcutaneous space on series 2, image 71 (about 1.8 cm thickness and roughly 14 cm in length). Early rim enhancement there. No soft tissue gas. Sequelae of gastric bypass also with gastrojejunostomy. Small volume of fluid in the bypassed portion of the stomach. No free air or free fluid. Vascular/Lymphatic: Major vascular structures in the abdomen and pelvis appear patent. No lymphadenopathy. Reproductive: Absent uterus.  Diminutive or absent ovaries. Other: No pelvic free fluid. Musculoskeletal: Lower thoracic spinal stimulator device and previous lumbosacral fusion again noted. Severe right SI joint degeneration. No acute osseous abnormality identified. IMPRESSION: 1. Postoperative changes and inflammation underlying the left abdominal ostomy bag, but no ostomy bowel loop can be delineated. Suspect this reflects a recent ostomy take-down, correlate with surgical history. In addition to inflammatory stranding in the ostomy region there is an elongated 1-2 cm thick fluid collection tracking about 14 cm into the left lateral and lower abdominal wall with early rim enhancement suspicious for abscess. 2. No bowel dilatation or obstruction. No free air or free fluid. Previous  gastrojejunostomy also. 3. Chest CTA reported separately. Electronically Signed   By: HGenevie AnnM.D.   On: 03/03/2021 10:23   DG Chest Portable 1 View  Result Date: 03/03/2021 CLINICAL DATA:  55year old female with cough. EXAM: PORTABLE CHEST 1 VIEW COMPARISON:  CTA chest 10/10/2018 and earlier. FINDINGS: Portable AP upright view at 0811 hours. Large body habitus. Lordotic positioning. Chronic thoracic spinal stimulator. Right PICC line in place. Mediastinal contours are stable and within normal limits. Allowing for portable technique the lungs are clear. No pneumothorax or pleural effusion. No acute osseous abnormality identified. Paucity of bowel gas in the upper abdomen. IMPRESSION: Large body habitus and lordotic positioning. No acute cardiopulmonary abnormality identified. Electronically Signed   By: HGenevie AnnM.D.   On: 03/03/2021 08:19   Labs on Admission: I have personally reviewed following labs  CBC: Recent Labs  Lab 03/03/21 0839  WBC 13.6*  NEUTROABS 10.2*  HGB 8.8*  HCT 26.8*  MCV 87.3  PLT 015   Basic Metabolic Panel: Recent Labs  Lab 03/03/21 0839  NA 137  K 3.3*  CL 102  CO2 26  GLUCOSE 126*  BUN 13  CREATININE 0.77  CALCIUM 8.6*  MG 1.9   GFR: Estimated Creatinine Clearance: 112.1 mL/min (by C-G formula based on SCr of 0.77 mg/dL). Liver Function Tests: Recent Labs  Lab 03/03/21 0839  AST 36  ALT 33  ALKPHOS 66  BILITOT 0.9  PROT 6.1*  ALBUMIN 1.6*   No results for input(s): LIPASE, AMYLASE in the last 168 hours. No results for input(s): AMMONIA in the last 168 hours. Coagulation Profile: No results for input(s): INR, PROTIME in the last 168 hours. Cardiac Enzymes: No results for input(s): CKTOTAL, CKMB, CKMBINDEX, TROPONINI in the last 168 hours. BNP (last 3 results) No results for input(s): PROBNP in the last 8760 hours. HbA1C: No results for input(s): HGBA1C in the last 72 hours. CBG: No results for input(s): GLUCAP in the last 168  hours. Lipid Profile: No results for input(s): CHOL, HDL, LDLCALC, TRIG, CHOLHDL, LDLDIRECT in the last 72 hours. Thyroid Function Tests: No results for input(s): TSH, T4TOTAL, FREET4, T3FREE, THYROIDAB in the last 72 hours. Anemia Panel: No results for input(s): VITAMINB12, FOLATE, FERRITIN, TIBC, IRON, RETICCTPCT in the last 72 hours. Urine analysis:    Component Value Date/Time   COLORURINE YELLOW (A) 08/31/2016 2037   APPEARANCEUR HAZY (A) 08/31/2016 2037   APPEARANCEUR Hazy 10/31/2014 0731   LABSPEC 1.011 08/31/2016 2037   LABSPEC 1.013 10/31/2014 0731   PHURINE 6.0 08/31/2016 2037   GLUCOSEU NEGATIVE 08/31/2016 2037   GLUCOSEU Negative 10/31/2014 0731   HGBUR NEGATIVE 08/31/2016 2037   BILIRUBINUR neg 01/16/2020 0856   BILIRUBINUR Negative 10/31/2014 0731   KETONESUR NEGATIVE 08/31/2016 2037   PROTEINUR Negative 01/16/2020 0856   PROTEINUR NEGATIVE 08/31/2016 2037   UROBILINOGEN 0.2 01/16/2020 0856   NITRITE neg 01/16/2020 0856   NITRITE NEGATIVE 08/31/2016 2037   LEUKOCYTESUR Negative 01/16/2020 0856   LEUKOCYTESUR 1+ 10/31/2014 0731    Rylee Huestis N Brileigh Sevcik D.O. [facility]  If 7PM-7AM, please contact overnight-coverage provider If 7AM-7PM, please contact day coverage provider [email or web address]  03/03/2021, 1:15 PM

## 2021-03-03 NOTE — ED Triage Notes (Signed)
Pt with noted leaking from under her colostomy bag, bag does not appear to sealed properly.  This RN spoke with Dr. Charna Archer regarding blood work orders due to patient receiving TPN through PICC line, per Dr. Charna Archer, no orders for blood work received at this time.

## 2021-03-03 NOTE — Consult Note (Signed)
Chief Complaint: Intra abdominal abscess. Request is for intra- abdominal abscess drain placement  Referring Physician(s): Dr. Ermalinda Memos  Supervising Physician: Mir, Sharen Heck  Patient Status: [facility] - In-pt  History of Present Illness: Kimberly Cowan is a 55 y.o. female History of GERD, obesity s/p Roux-en-Y in 2005 with bypass revision on  11.24.21. Chronic back pain s/p nerve stimulator. Cholelithiasis  s/p lap chole and jejunostomy tube placement for food intolerance X 6 weeks on 5.13.22 at [facility] in [place]. Patient was discharged and seen at the bariatric surgeons office on 5.19.22 due to  leaking around the J tube site. Patient states that she was re-admitted to [facility] from the [place] office ( May 19  -27).  Md Barletta states that it was during her admission that the surgeon removed her J tube at bedside. The patient is receiving TPN from PICC line placed at [place].  Patient presented to the ED at [facility] with back pain, concern for skin infection over her stimulator site and  leaking from the former J tube site. CT abdomen pelvis reads 1-2 cm thick fluid collection tracking about 14 cm into the left lateral and lower abdominal wall with early rim enhancement suspicious for abscess.Team is requesting an intra abdominal abscess drain placement.   Currently endorsing abdominal pain worse with palpation and fever of 103. She reports a sensation that she describes as "GERD" radiation from her abdomen to her esophagus.Patient alert and laying in bed. Denies any  headache, chest pain, SOB, cough, nausea, vomiting or bleeding. Return precautions and treatment recommendations and follow-up discussed with the patient who is agreeable with the plan.   Past Medical History:  Diagnosis Date  . Allergy 1999   SEASONAL  . Anemia   . Anxiety   . Arm pain, left 05/17/2018  . Arthritis    lower spine  . Bilateral lumbar radiculopathy   . Chronic back pain   . Chronic GERD  03/03/2021  . Clinical depression 04/04/2012  . Depression   . DVT (deep venous thrombosis) ([place])   . Encounter for screening colonoscopy   . Glaucoma    RIGHT EYE  . Headache(784.0)    migraines  . Major depressive disorder with single episode, in full remission ([place]) 08/15/2018  . Ovarian cyst   . Pneumonia    walking pneumonia  . Sleep apnea    no cpcap     last sleep study > 4 yrs    Past Surgical History:  Procedure Laterality Date  . ABDOMINAL HYSTERECTOMY  10/2006  . BACK SURGERY  2014   lumbar fusion  . BACK SURGERY  2009   discectomy  . BARIATRIC SURGERY  07/2020   Revision  . [place]  . COLONOSCOPY WITH PROPOFOL N/A 04/12/2018   Procedure: COLONOSCOPY WITH PROPOFOL;  Surgeon: Lin Landsman, MD;  Location: [facility] ENDOSCOPY;  Service: Gastroenterology;  Laterality: N/A;  . GASTRIC BYPASS  2005  . KNEE ARTHROSCOPY WITH LATERAL MENISECTOMY Left 05/27/2017   Procedure: KNEE ARTHROSCOPY WITH LATERAL MENISECTOMY;  Surgeon: Hessie Knows, MD;  Location: [facility] ORS;  Service: Orthopedics;  Laterality: Left;  . KNEE ARTHROSCOPY WITH LATERAL RELEASE Left 05/27/2017   Procedure: KNEE ARTHROSCOPY WITH LATERAL RELEASE;  Surgeon: Hessie Knows, MD;  Location: [facility] ORS;  Service: Orthopedics;  Laterality: Left;  . LAPAROSCOPIC SALPINGO OOPHERECTOMY Bilateral 10/05/2016   Procedure: LAPAROSCOPIC SALPINGO OOPHORECTOMY;  Surgeon: Brayton Mars, MD;  Location: [facility] ORS;  Service: Gynecology;  Laterality:  Bilateral;  . REDUCTION MAMMAPLASTY Bilateral    2001  . SPINAL CORD STIMULATOR INSERTION N/A 03/20/2016   Procedure: LUMBAR SPINAL CORD STIMULATOR INSERTION;  Surgeon: Eustace Moore, MD;  Location: [facility] NEURO ORS;  Service: Neurosurgery;  Laterality: N/A;  . SPINE SURGERY  2009 2014 2017  . TONSILLECTOMY      Allergies: Morphine and related, Penicillins, Latex, and Baclofen  Medications: Prior to Admission medications   Medication Sig Start Date End Date  Taking? Authorizing Provider  cyclobenzaprine (FLEXERIL) 10 MG tablet Take 10 mg by mouth daily as needed for muscle spasms.   Yes [provider]  latanoprost (XALATAN) 0.005 % ophthalmic solution Place 1 drop into both eyes at bedtime.    Yes [provider]  oxyCODONE-acetaminophen (PERCOCET/ROXICET) 5-325 MG tablet Take 1 tablet by mouth every 6 (six) hours as needed for moderate pain.   Yes [provider]  pantoprazole (PROTONIX) 40 MG tablet Take 40 mg by mouth daily.   Yes [provider]  rizatriptan (MAXALT-MLT) 10 MG disintegrating tablet DISSOLVE 1 TABLET BY MOUTH AS NEEDED FOR MIGRAINE- MAY REPEAT IN 2 HOURS AS NEEDED Patient taking differently: Take 10 mg by mouth daily as needed for migraine. (repeat after 2 hours if needed) 11/25/20  Yes Glean Hess, MD  Suvorexant (BELSOMRA) 20 MG TABS Take 1 tablet by mouth at bedtime as needed. Patient taking differently: Take 20 mg by mouth at bedtime. 02/03/21  Yes Glean Hess, MD  cyanocobalamin (,VITAMIN B-12,) 1000 MCG/ML injection ADMINISTER 1 ML(1000 MCG) IN THE MUSCLE EVERY 30 DAYS Patient not taking: No sig reported 02/17/21   Glean Hess, MD  fluticasone [facility]) 50 MCG/ACT nasal spray SHAKE LIQUID AND USE 2 SPRAYS IN [facility] NOSTRIL DAILY Patient not taking: No sig reported 09/24/20   Glean Hess, MD  levocetirizine (XYZAL) 5 MG tablet TAKE 1 TABLET(5 MG) BY MOUTH EVERY EVENING Patient not taking: No sig reported 04/09/20   Glean Hess, MD     Family History  Problem Relation Age of Onset  . Diabetes Mother   . Hypertension Mother   . Obesity Mother   . Diabetes Sister   . Hypertension Sister   . Hypertension Father   . Obesity Daughter   . Obesity Sister   . Obesity Sister   . Breast cancer Neg Hx     Social History   Socioeconomic History  . Marital status: Widowed    Spouse name: Not on file  . Number of children: 2  . Years of education: some college  .  Highest education level: 12th grade  Occupational History  . Occupation: disabled  Tobacco Use  . Smoking status: Never Smoker  . Smokeless tobacco: Never Used  . Tobacco comment: smoking cessation materials not required  Vaping Use  . Vaping Use: Never used  Substance and Sexual Activity  . Alcohol use: No    Alcohol/week: 0.0 standard drinks  . Drug use: No  . Sexual activity: Not Currently    Birth control/protection: Surgical  Other Topics Concern  . Not on file  Social History Narrative   Pt lives with her daughter and family    Social Determinants of Health   Financial Resource Strain: Low Risk   . Difficulty of Paying Living Expenses: Not very hard  Food Insecurity: No Food Insecurity  . Worried About Charity fundraiser in the Last Year: Never true  . Ran Out of Food in the Last Year:  Never true  Transportation Needs: No Transportation Needs  . Lack of Transportation (Medical): No  . Lack of Transportation (Non-Medical): No  Physical Activity: Insufficiently Active  . Days of Exercise per Week: 3 days  . Minutes of Exercise per Session: 30 min  Stress: No Stress Concern Present  . Feeling of Stress : Not at all  Social Connections: Moderately Isolated  . Frequency of Communication with Friends and Family: More than three times a week  . Frequency of Social Gatherings with Friends and Family: Three times a week  . Attends Religious Services: More than 4 times per year  . Active Member of Clubs or Organizations: No  . Attends Archivist Meetings: Never  . Marital Status: Widowed     Review of Systems: A 12 point ROS discussed and pertinent positives are indicated in the HPI above.  All other systems are negative.  Review of Systems  Constitutional: Positive for fever ( 103 on Saturday). Negative for fatigue.  HENT: Negative for congestion.   Respiratory: Negative for cough and shortness of breath.   Gastrointestinal: Positive for abdominal pain.  Negative for diarrhea, nausea and vomiting.    Vital Signs: BP 115/75   Pulse (!) 110   Temp 98.5 F (36.9 C) (Oral)   Resp (!) 36   Ht 5\' 7"  (1.702 m)   Wt 272 lb (123.4 kg)   SpO2 91%   BMI 42.60 kg/m   Physical Exam Vitals and nursing note reviewed.  Constitutional:      Appearance: She is well-developed.  HENT:     Head: Normocephalic and atraumatic.  Eyes:     Conjunctiva/sclera: Conjunctivae normal.  Cardiovascular:     Rate and Rhythm: Normal rate and regular rhythm.     Heart sounds: Normal heart sounds.  Pulmonary:     Effort: Pulmonary effort is normal.     Breath sounds: Normal breath sounds.  Abdominal:     Comments: Purulent looking output from former j tube site.   Musculoskeletal:        General: Normal range of motion.     Cervical back: Normal range of motion.  Skin:    General: Skin is warm.  Neurological:     Mental Status: She is alert and oriented to person, place, and time.     Imaging: CT ABDOMEN PELVIS WO CONTRAST  Result Date: 03/03/2021 CLINICAL DATA:  Abdominal pain.  Leaking colostomy bag. EXAM: CT ABDOMEN AND PELVIS WITHOUT CONTRAST TECHNIQUE: Multidetector CT imaging of the abdomen and pelvis was performed following the standard protocol without IV contrast. COMPARISON:  08/31/2016. FINDINGS: Lower chest: Chronic appearing volume loss in the right middle and right lower lobes, adjacent to an elevated right hemidiaphragm. Heart size normal. No pericardial or pleural effusion. Distal esophagus is unremarkable. Hepatobiliary: Liver is decreased in attenuation diffusely. Heterogeneous area of decreased attenuation along the falciform ligament may be due to additional fat deposition. Liver is minimally enlarged, 18.1 cm. Cholecystectomy. No unexpected biliary ductal dilatation. Pancreas: Negative. Spleen: Negative. Adrenals/Urinary Tract: Adrenal glands and kidneys are unremarkable. Ureters are decompressed. There may be slight ventral bladder wall  thickening. Stomach/Bowel: Gastric bypass. Left lower quadrant ileostomy. Associated subcutaneous edema and possible scattered fluid. Colon is unremarkable. Vascular/Lymphatic: Vascular structures are unremarkable. No pathologically enlarged lymph nodes. Reproductive: Uterus is visualized.  No adnexal mass. Other: No free fluid.  Mesenteries and peritoneum are unremarkable. Musculoskeletal: Postoperative and degenerative changes in the spine. Spinal stimulator wires are seen in the thoracic  spine. IMPRESSION: 1. Left lower quadrant ileostomy. Associated subcutaneous edema and scattered fluid. Difficult to exclude a draining abscess. Consider repeat imaging with IV contrast, as clinically indicated. 2. Enlarged steatotic liver with probable focal fat along the falciform ligament. Electronically Signed   By: Lorin Picket M.D.   On: 03/03/2021 08:53   CT Angio Chest PE W and/or Wo Contrast  Result Date: 03/03/2021 CLINICAL DATA:  Chest pain. EXAM: CT ANGIOGRAPHY CHEST WITH CONTRAST TECHNIQUE: Multidetector CT imaging of the chest was performed using the standard protocol during bolus administration of intravenous contrast. Multiplanar CT image reconstructions and MIPs were obtained to evaluate the vascular anatomy. CONTRAST:  125mL OMNIPAQUE IOHEXOL 350 MG/ML SOLN COMPARISON:  October 10, 2018. FINDINGS: Cardiovascular: Satisfactory opacification of the pulmonary arteries to the segmental level. No evidence of pulmonary embolism. Normal heart size. No pericardial effusion. Mediastinum/Nodes: No enlarged mediastinal, hilar, or axillary lymph nodes. Thyroid gland, trachea, and esophagus demonstrate no significant findings. Lungs/Pleura: No pneumothorax or pleural effusion is noted. Left lung is clear. Mild right lower lobe subsegmental atelectasis is noted. Upper Abdomen: Status post gastric bypass. Musculoskeletal: No chest wall abnormality. No acute or significant osseous findings. Review of the MIP images  confirms the above findings. IMPRESSION: No definite evidence of pulmonary embolus. Mild right lower lobe subsegmental atelectasis. Electronically Signed   By: Marijo Conception M.D.   On: 03/03/2021 10:54   CT ABDOMEN PELVIS W CONTRAST  Result Date: 03/03/2021 CLINICAL DATA:  55 year old female with abdominal pain. Difficult to exclude draining abscess from left lower quadrant ileostomy site on noncontrast study earlier today. EXAM: CT ABDOMEN AND PELVIS WITH CONTRAST TECHNIQUE: Multidetector CT imaging of the abdomen and pelvis was performed using the standard protocol following bolus administration of intravenous contrast. CONTRAST:  138mL OMNIPAQUE IOHEXOL 350 MG/ML SOLN COMPARISON:  CT Abdomen and Pelvis without contrast 0825 hours. CTA chest reported separately. FINDINGS: Lower chest: Reported separately today. Hepatobiliary: Diminutive or absent gallbladder.  Negative liver. Pancreas: Negative. Spleen: Negative. Adrenals/Urinary Tract: Negative. Pelvic phleboliths. No urinary calculus or obstructive uropathy. Stomach/Bowel: Redundant but largely decompressed large bowel from the splenic flexure distally. Gas-filled transverse colon also contains a small volume of fluid. Mostly decompressed right colon. Decompressed terminal ileum. Small bowel anastomosis in the right mid abdomen on series 2, image 65 with no adverse features. Sequelae of ileostomy take down suspected at the left abdominal wall although a bag remains in place. But no obvious ileostomy or colostomy loop can be identified. Moderate inflammatory stranding there, and thin mildly complex fluid collection tracking inferiorly and laterally in the subcutaneous space on series 2, image 71 (about 1.8 cm thickness and roughly 14 cm in length). Early rim enhancement there. No soft tissue gas. Sequelae of gastric bypass also with gastrojejunostomy. Small volume of fluid in the bypassed portion of the stomach. No free air or free fluid. Vascular/Lymphatic:  Major vascular structures in the abdomen and pelvis appear patent. No lymphadenopathy. Reproductive: Absent uterus.  Diminutive or absent ovaries. Other: No pelvic free fluid. Musculoskeletal: Lower thoracic spinal stimulator device and previous lumbosacral fusion again noted. Severe right SI joint degeneration. No acute osseous abnormality identified. IMPRESSION: 1. Postoperative changes and inflammation underlying the left abdominal ostomy bag, but no ostomy bowel loop can be delineated. Suspect this reflects a recent ostomy take-down, correlate with surgical history. In addition to inflammatory stranding in the ostomy region there is an elongated 1-2 cm thick fluid collection tracking about 14 cm into the left lateral and lower  abdominal wall with early rim enhancement suspicious for abscess. 2. No bowel dilatation or obstruction. No free air or free fluid. Previous gastrojejunostomy also. 3. Chest CTA reported separately. Electronically Signed   By: Genevie Ann M.D.   On: 03/03/2021 10:23   DG Chest Portable 1 View  Result Date: 03/03/2021 CLINICAL DATA:  55 year old female with cough. EXAM: PORTABLE CHEST 1 VIEW COMPARISON:  CTA chest 10/10/2018 and earlier. FINDINGS: Portable AP upright view at 0811 hours. Large body habitus. Lordotic positioning. Chronic thoracic spinal stimulator. Right PICC line in place. Mediastinal contours are stable and within normal limits. Allowing for portable technique the lungs are clear. No pneumothorax or pleural effusion. No acute osseous abnormality identified. Paucity of bowel gas in the upper abdomen. IMPRESSION: Large body habitus and lordotic positioning. No acute cardiopulmonary abnormality identified. Electronically Signed   By: Genevie Ann M.D.   On: 03/03/2021 08:19    Labs:  CBC: Recent Labs    03/03/21 0839  WBC 13.6*  HGB 8.8*  HCT 26.8*  PLT 384    COAGS: No results for input(s): INR, APTT in the last 8760 hours.  BMP: Recent Labs    07/24/20 1554  11/25/20 1004 03/03/21 0839  NA 145* 144 137  K 4.0 3.6 3.3*  CL 108* 104 102  CO2 27 25 26   GLUCOSE 85 89 126*  BUN 7 5* 13  CALCIUM 9.9 9.6 8.6*  CREATININE 0.79 0.67 0.77  GFRNONAA 86  --  >60  GFRAA 100  --   --     LIVER FUNCTION TESTS: Recent Labs    07/24/20 1554 11/25/20 1004 03/03/21 0839  BILITOT 0.2 0.5 0.9  AST 14 27 36  ALT 10 27 33  ALKPHOS 88 97 66  PROT 6.7 6.7 6.1*  ALBUMIN 4.0 3.7* 1.6*    Assessment and Plan:  55 y.o. female, inpatient (in the ED). History of obesity s/p Roux-en-Y in 2005 with bypass revision on  11.24.21. Chronic back pain s/p nerve stimulator. Cholelithiasis s/p lap chole and jejunostomy tube placement for food intolerance X 6 weeks on 5.13.22 at [facility] in [place]. Patient was discharged and seen at the bariatric surgeons office on 5.19.22 due to  leaking around the J tube site. Patient states that she was re-admitted to [facility] from the [place] office ( May 19  -27).  Md Sedam states that it was during her admission that the surgeon removed her J tube at bedside. The patient is receiving TPN from PICC line placed at [place].  Patient presented to the ED at [facility] with back pain, concern for skin infection over her stimulator site and  leaking from the former J tube site. CT abdomen pelvis reads 1-2 cm thick fluid collection tracking about 14 cm into the left lateral and lower abdominal wall with early rim enhancement suspicious for abscess.Team is requesting an intra abdominal abscess drain placement.   WBC is 133.6, Potassium 3.3, Calcium 8.6 albumin 1.6, Total protein 6.1. all other labs and medications are within acceptable parameters. Allergies include morphine, PCN and  Latex. Patient has been NPO since midnight.   Risks and benefits discussed with the patient including bleeding, infection, damage to adjacent structures, bowel perforation/fistula connection, and sepsis.  All of the patient's questions were answered, patient is  agreeable to proceed. Consent signed and in [place] control room.   Thank you for this interesting consult.  I greatly enjoyed meeting [facility] and look forward to participating in  their care.  A copy of this report was sent to the requesting provider on this date.  Electronically Signed: Jacqualine Mau, NP 03/03/2021, 1:35 PM   I spent a total of 40 Minutes    in face to face in clinical consultation, greater than 50% of which was counseling/coordinating care for intra abdominal abscess drain placement

## 2021-03-03 NOTE — Progress Notes (Signed)
Patient clinically stable post 10 FR abscess drain placement per Dr Dwaine Gale, tolerated well. Vitals stable pre and post procedure. Versed 2 mg along with Fentanyl 100 mcg IV given for procedure. Post procedure to await bed placement in ER,as patient stable.

## 2021-03-03 NOTE — Sepsis Progress Note (Signed)
Sepsis protocol being followed by Warren Lacy

## 2021-03-03 NOTE — ED Notes (Signed)
Pt with recently placed PICC line and colostomy bag. Pt also with

## 2021-03-03 NOTE — ED Notes (Signed)
Pt's linens changed due to urine leaking around ext cath. Pt repositioned for comfort. Pt transported for procedure OTF at this time.

## 2021-03-03 NOTE — Consult Note (Signed)
Pharmacy Antibiotic Note  Kimberly Cowan is a 55 y.o. female admitted on 03/03/2021 with intra-abdominal infection.  Pharmacy has been consulted for ciprofloxacin and vancomycin dosing. Hx of PEG tube placement and PICC line.   Plan: Will start cipro 400 mg IV BID. PCN allergy reported as anaphylaxis.   Pt received vancomycin 2500 mg x 1. Will order vancomycin 1000 mg q12H for a predicted AUC of 457. Goal AUC 400-550. Used Scr 0.8. Vd 0.5. Plan to obtain vancomycin levels prior to the 4th or 5th dose.   Height: 5\' 7"  (170.2 cm) Weight: 123.4 kg (272 lb) IBW/kg (Calculated) : 61.6  Temp (24hrs), Avg:98.5 F (36.9 C), Min:98.5 F (36.9 C), Max:98.5 F (36.9 C)  Recent Labs  Lab 03/03/21 0839  WBC 13.6*  CREATININE 0.77  LATICACIDVEN 1.5    Estimated Creatinine Clearance: 112.1 mL/min (by C-G formula based on SCr of 0.77 mg/dL).    Allergies  Allergen Reactions  . Morphine And Related Hives, Shortness Of Breath, Nausea And Vomiting and Other (See Comments)    "can't breathe"  . Penicillins Anaphylaxis, Hives, Shortness Of Breath and Nausea And Vomiting    "can't breathe", Has patient had a PCN reaction causing immediate rash, facial/tongue/throat swelling, SOB or lightheadedness with hypotension: Yes Has patient had a PCN reaction causing severe rash involving mucus membranes or skin necrosis: No Has patient had a PCN reaction that required hospitalization No Has patient had a PCN reaction occurring within the last 10 years: No If all of the above answers are "NO", then may proceed with Cephalosporin use.   . Latex Hives  . Baclofen Nausea Only    Jittery, anxious    Antimicrobials this admission: 6/6 cipro >>  6/6 vancomcyin >>   Dose adjustments this admission: None  Microbiology results: 6/6 BCx: pending  Thank you for allowing pharmacy to be a part of this patient's care.  Oswald Hillock, PharmD, BCPS 03/03/2021 12:45 PM

## 2021-03-03 NOTE — ED Notes (Signed)
[facility]  med  surg  called per  dr  z  Tamala Julian  md

## 2021-03-03 NOTE — ED Notes (Signed)
Dr Tobie Poet notified of pt being transferred. Sts potassium may be rechecked at receiving facility prior to giving remaining doses.

## 2021-03-03 NOTE — Procedures (Signed)
Interventional Radiology Procedure Note  Procedure: 10 fr drain placed in LLQ subq abscess  Indication: LLQ abscess  Findings: Please refer to procedural dictation for full description.  Complications: None  EBL: < 10 mL  Miachel Roux, MD [phone]

## 2021-03-04 LAB — HIV ANTIBODY (ROUTINE TESTING W REFLEX): HIV Screen 4th Generation wRfx: NONREACTIVE

## 2021-03-05 LAB — URINE CULTURE

## 2021-03-08 LAB — CULTURE, BLOOD (ROUTINE X 2)
Culture: NO GROWTH
Culture: NO GROWTH
Special Requests: ADEQUATE
Special Requests: ADEQUATE

## 2021-03-09 LAB — AEROBIC/ANAEROBIC CULTURE W GRAM STAIN (SURGICAL/DEEP WOUND)

## 2021-03-10 DIAGNOSIS — M6281 Muscle weakness (generalized): Secondary | ICD-10-CM | POA: Diagnosis not present

## 2021-03-10 NOTE — Progress Notes (Signed)
ED Antimicrobial Stewardship Positive Culture Follow Up   Kimberly Cowan is an 55 y.o. female who presented to [facility] on 03/03/2021 with a chief complaint of No chief complaint on file.   Recent Results (from the past 720 hour(s))  Blood culture (routine x 2)     Status: None   Collection Time: 03/03/21  8:39 AM   Specimen: BLOOD  Result Value Ref Range Status   Specimen Description BLOOD LEFT HAND  Final   Special Requests   Final    BOTTLES DRAWN AEROBIC AND ANAEROBIC Blood Culture adequate volume   Culture   Final    NO GROWTH 5 DAYS Performed at [facility], [address], [place], [place] [address]    Report Status 03/08/2021 FINAL  Final  Blood culture (routine x 2)     Status: None   Collection Time: 03/03/21  8:39 AM   Specimen: BLOOD  Result Value Ref Range Status   Specimen Description BLOOD LEFT HAND  Final   Special Requests   Final    BOTTLES DRAWN AEROBIC AND ANAEROBIC Blood Culture adequate volume   Culture   Final    NO GROWTH 5 DAYS Performed at [facility], [address] [place],  [address]    Report Status 03/08/2021 FINAL  Final  Resp Panel by RT-PCR (Flu A&B, Covid) Nasopharyngeal Swab     Status: None   Collection Time: 03/03/21  8:39 AM   Specimen: Nasopharyngeal Swab; Nasopharyngeal(NP) swabs in vial transport medium  Result Value Ref Range Status   SARS Coronavirus 2 by RT PCR NEGATIVE NEGATIVE Final    Comment: (NOTE) SARS-CoV-2 target nucleic acids are NOT DETECTED.  The SARS-CoV-2 RNA is generally detectable in upper respiratory specimens during the acute phase of infection. The lowest concentration of SARS-CoV-2 viral copies this assay can detect is 138 copies/mL. A negative result does not preclude SARS-Cov-2 infection and should not be used as the sole basis for treatment or other patient management decisions. A negative result may occur with  improper specimen collection/handling, submission  of specimen other than nasopharyngeal swab, presence of viral mutation(s) within the areas targeted by this assay, and inadequate number of viral copies(<138 copies/mL). A negative result must be combined with clinical observations, patient history, and epidemiological information. The expected result is Negative.  Fact Sheet for Patients:  EntrepreneurPulse.com.au  Fact Sheet for Healthcare Providers:  IncredibleEmployment.be  This test is no t yet approved or cleared by the [place] FDA and  has been authorized for detection and/or diagnosis of SARS-CoV-2 by FDA under an Emergency Use Authorization (EUA). This EUA will remain  in effect (meaning this test can be used) for the duration of the COVID-19 declaration under Section 564(b)(1) of the Act, 21 U.S.C.section 360bbb-3(b)(1), unless the authorization is terminated  or revoked sooner.       Influenza A by PCR NEGATIVE NEGATIVE Final   Influenza B by PCR NEGATIVE NEGATIVE Final    Comment: (NOTE) The Xpert Xpress SARS-CoV-2/FLU/RSV plus assay is intended as an aid in the diagnosis of influenza from Nasopharyngeal swab specimens and should not be used as a sole basis for treatment. Nasal washings and aspirates are unacceptable for Xpert Xpress SARS-CoV-2/FLU/RSV testing.  Fact Sheet for Patients: EntrepreneurPulse.com.au  Fact Sheet for Healthcare Providers: IncredibleEmployment.be  This test is not yet approved or cleared by the [place] FDA and has been authorized for detection and/or diagnosis of SARS-CoV-2 by FDA under an Emergency Use  Authorization (EUA). This EUA will remain in effect (meaning this test can be used) for the duration of the COVID-19 declaration under Section 564(b)(1) of the Act, 21 U.S.C. section 360bbb-3(b)(1), unless the authorization is terminated or revoked.  Performed at [facility], [address], [place], [place] [address]   Aerobic/Anaerobic Culture w Gram Stain (surgical/deep wound)     Status: None   Collection Time: 03/03/21  4:11 PM   Specimen: Abscess  Result Value Ref Range Status   Specimen Description   Final    ABSCESS Performed at [facility], [address] [place], [place] [address]    Special Requests LLQ SUBQ  Final   Gram Stain   Final    FEW WBC PRESENT,BOTH PMN AND MONONUCLEAR FEW GRAM POSITIVE COCCI IN PAIRS IN CLUSTERS    Culture   Final    RARE STAPHYLOCOCCUS SIMULANS FEW STENOTROPHOMONAS MALTOPHILIA NO ANAEROBES ISOLATED Performed at [facility], [place] [address], [place], [place] [address]    Report Status 03/09/2021 FINAL  Final   Organism ID, Bacteria STAPHYLOCOCCUS SIMULANS  Final   Organism ID, Bacteria STENOTROPHOMONAS MALTOPHILIA  Final      Susceptibility   Stenotrophomonas maltophilia - MIC*    LEVOFLOXACIN >=8 RESISTANT Resistant     TRIMETH/SULFA 80 RESISTANT Resistant     * FEW STENOTROPHOMONAS MALTOPHILIA   Staphylococcus simulans - MIC*    CIPROFLOXACIN >=8 RESISTANT Resistant     ERYTHROMYCIN >=8 RESISTANT Resistant     GENTAMICIN <=0.5 SENSITIVE Sensitive     OXACILLIN >=4 RESISTANT Resistant     TETRACYCLINE <=1 SENSITIVE Sensitive     VANCOMYCIN <=0.5 SENSITIVE Sensitive     TRIMETH/SULFA <=10 SENSITIVE Sensitive     CLINDAMYCIN 4 RESISTANT Resistant     RIFAMPIN <=0.5 SENSITIVE Sensitive     Inducible Clindamycin NEGATIVE Sensitive     * RARE STAPHYLOCOCCUS SIMULANS  Urine culture     Status: Abnormal   Collection Time: 03/03/21  5:06 PM   Specimen: In/Out Cath Urine  Result Value Ref Range Status   Specimen Description   Final    IN/OUT CATH URINE Performed at [facility], [address] [place], [place] [address]    Special Requests   Final    NONE Performed at [facility], [address] [place], [place] [address]    Culture MULTIPLE SPECIES PRESENT, SUGGEST RECOLLECTION  (A)  Final   Report Status 03/05/2021 FINAL  Final    Patient received Vancomycin , Ciprofloxacin and Metronidazole in ED. Abd wall abscess.  Patient then transferred to [facility]. Per Care Everywhere appears patient discharged from [facility] 03/09/21 on levaquin and Metronidazole  Faxed culture results to office of provider listed in [facility] notes. Dr. Clayburn Pert [facility] Office ph  [phone] Fax [phone]     Kalynne Womac A 03/10/2021, 5:50 PM Clinical Pharmacist

## 2021-03-11 ENCOUNTER — Encounter: Payer: Self-pay | Admitting: Internal Medicine

## 2021-03-11 DIAGNOSIS — R112 Nausea with vomiting, unspecified: Secondary | ICD-10-CM | POA: Diagnosis not present

## 2021-03-12 DIAGNOSIS — Z6841 Body Mass Index (BMI) 40.0 and over, adult: Secondary | ICD-10-CM | POA: Insufficient documentation

## 2021-03-13 DIAGNOSIS — L03311 Cellulitis of abdominal wall: Secondary | ICD-10-CM | POA: Diagnosis not present

## 2021-03-14 ENCOUNTER — Telehealth: Payer: Self-pay | Admitting: Internal Medicine

## 2021-03-14 NOTE — Telephone Encounter (Signed)
Gave verbal orders and informed Dr Army Melia did not prescribe these meds for patient.

## 2021-03-14 NOTE — Telephone Encounter (Signed)
Kimberly Cowan PT with [facility] is calling and needs PT verbal order for 2x3 and then 1x2 . Kimberly Cowan also is calling to let dr berglund know  there is level 2 medication interaction warning with levofloxacin and zofran. Pt is not having any symptoms. Please advise

## 2021-03-18 DIAGNOSIS — R112 Nausea with vomiting, unspecified: Secondary | ICD-10-CM | POA: Diagnosis not present

## 2021-03-18 DIAGNOSIS — Z9884 Bariatric surgery status: Secondary | ICD-10-CM | POA: Diagnosis not present

## 2021-03-19 ENCOUNTER — Other Ambulatory Visit: Payer: Self-pay | Admitting: Internal Medicine

## 2021-03-19 DIAGNOSIS — R600 Localized edema: Secondary | ICD-10-CM

## 2021-03-20 DIAGNOSIS — K9413 Enterostomy malfunction: Secondary | ICD-10-CM | POA: Diagnosis not present

## 2021-03-20 DIAGNOSIS — Z9884 Bariatric surgery status: Secondary | ICD-10-CM | POA: Diagnosis not present

## 2021-03-21 ENCOUNTER — Encounter: Payer: Self-pay | Admitting: Internal Medicine

## 2021-03-21 ENCOUNTER — Other Ambulatory Visit: Payer: Self-pay | Admitting: Internal Medicine

## 2021-03-21 DIAGNOSIS — B360 Pityriasis versicolor: Secondary | ICD-10-CM

## 2021-03-21 MED ORDER — KETOCONAZOLE 2 % EX SHAM: 1.0000 "application " | MEDICATED_SHAMPOO | Freq: Every day | CUTANEOUS | 1 refills | Status: DC | PRN

## 2021-03-26 DIAGNOSIS — Z9884 Bariatric surgery status: Secondary | ICD-10-CM | POA: Diagnosis not present

## 2021-03-26 DIAGNOSIS — Z79899 Other long term (current) drug therapy: Secondary | ICD-10-CM | POA: Diagnosis not present

## 2021-03-27 ENCOUNTER — Other Ambulatory Visit (INDEPENDENT_AMBULATORY_CARE_PROVIDER_SITE_OTHER): Payer: BC Managed Care – PPO | Admitting: Internal Medicine

## 2021-03-27 ENCOUNTER — Encounter: Payer: Self-pay | Admitting: Internal Medicine

## 2021-03-27 DIAGNOSIS — Z86711 Personal history of pulmonary embolism: Secondary | ICD-10-CM

## 2021-03-27 DIAGNOSIS — G43909 Migraine, unspecified, not intractable, without status migrainosus: Secondary | ICD-10-CM

## 2021-03-27 DIAGNOSIS — K219 Gastro-esophageal reflux disease without esophagitis: Secondary | ICD-10-CM

## 2021-03-27 NOTE — Progress Notes (Signed)
Received orders from [facility]. Start of care 03/13/21.   Orders from 03/13/21 through 05/11/21 are reviewed, signed and faxed.

## 2021-04-02 DIAGNOSIS — Z79899 Other long term (current) drug therapy: Secondary | ICD-10-CM | POA: Diagnosis not present

## 2021-04-02 DIAGNOSIS — Z9884 Bariatric surgery status: Secondary | ICD-10-CM | POA: Diagnosis not present

## 2021-04-02 DIAGNOSIS — E43 Unspecified severe protein-calorie malnutrition: Secondary | ICD-10-CM | POA: Diagnosis not present

## 2021-04-02 DIAGNOSIS — R112 Nausea with vomiting, unspecified: Secondary | ICD-10-CM | POA: Diagnosis not present

## 2021-04-09 DIAGNOSIS — Z9884 Bariatric surgery status: Secondary | ICD-10-CM | POA: Diagnosis not present

## 2021-04-09 DIAGNOSIS — R112 Nausea with vomiting, unspecified: Secondary | ICD-10-CM | POA: Diagnosis not present

## 2021-04-15 DIAGNOSIS — Z9884 Bariatric surgery status: Secondary | ICD-10-CM | POA: Diagnosis not present

## 2021-04-15 DIAGNOSIS — E43 Unspecified severe protein-calorie malnutrition: Secondary | ICD-10-CM | POA: Diagnosis not present

## 2021-04-15 DIAGNOSIS — Z6841 Body Mass Index (BMI) 40.0 and over, adult: Secondary | ICD-10-CM | POA: Diagnosis not present

## 2021-04-16 ENCOUNTER — Other Ambulatory Visit: Payer: Self-pay | Admitting: Internal Medicine

## 2021-04-16 DIAGNOSIS — G4733 Obstructive sleep apnea (adult) (pediatric): Secondary | ICD-10-CM | POA: Diagnosis not present

## 2021-04-16 DIAGNOSIS — Z9884 Bariatric surgery status: Secondary | ICD-10-CM | POA: Diagnosis not present

## 2021-04-16 DIAGNOSIS — F5101 Primary insomnia: Secondary | ICD-10-CM

## 2021-04-16 DIAGNOSIS — R112 Nausea with vomiting, unspecified: Secondary | ICD-10-CM | POA: Diagnosis not present

## 2021-04-16 NOTE — Telephone Encounter (Signed)
Requested medications are due for refill today.  Unknown  Requested medications are on the active medications list.  yes  Last refill.   Future visit scheduled.   yes  Notes to clinic.  Some are historical medications. One has no protocol, and 1 is not delegated.

## 2021-04-18 ENCOUNTER — Other Ambulatory Visit: Payer: Self-pay | Admitting: Internal Medicine

## 2021-04-18 DIAGNOSIS — E538 Deficiency of other specified B group vitamins: Secondary | ICD-10-CM

## 2021-04-18 DIAGNOSIS — F5101 Primary insomnia: Secondary | ICD-10-CM

## 2021-04-18 MED ORDER — BELSOMRA 20 MG PO TABS: 20.0000 mg | ORAL_TABLET | Freq: Every day | ORAL | 0 refills | Status: DC

## 2021-04-24 ENCOUNTER — Encounter: Payer: Medicare HMO | Admitting: Internal Medicine

## 2021-04-29 DIAGNOSIS — R0602 Shortness of breath: Secondary | ICD-10-CM | POA: Diagnosis not present

## 2021-04-29 DIAGNOSIS — M5416 Radiculopathy, lumbar region: Secondary | ICD-10-CM | POA: Diagnosis not present

## 2021-04-29 DIAGNOSIS — F329 Major depressive disorder, single episode, unspecified: Secondary | ICD-10-CM | POA: Diagnosis not present

## 2021-04-29 DIAGNOSIS — M961 Postlaminectomy syndrome, not elsewhere classified: Secondary | ICD-10-CM | POA: Diagnosis not present

## 2021-04-29 DIAGNOSIS — F419 Anxiety disorder, unspecified: Secondary | ICD-10-CM | POA: Diagnosis not present

## 2021-04-29 DIAGNOSIS — Z9884 Bariatric surgery status: Secondary | ICD-10-CM | POA: Diagnosis not present

## 2021-04-29 DIAGNOSIS — Z79899 Other long term (current) drug therapy: Secondary | ICD-10-CM | POA: Diagnosis not present

## 2021-04-29 DIAGNOSIS — G4733 Obstructive sleep apnea (adult) (pediatric): Secondary | ICD-10-CM | POA: Diagnosis not present

## 2021-04-29 DIAGNOSIS — Z6841 Body Mass Index (BMI) 40.0 and over, adult: Secondary | ICD-10-CM | POA: Diagnosis not present

## 2021-04-29 DIAGNOSIS — F1120 Opioid dependence, uncomplicated: Secondary | ICD-10-CM | POA: Diagnosis not present

## 2021-04-29 DIAGNOSIS — E46 Unspecified protein-calorie malnutrition: Secondary | ICD-10-CM | POA: Diagnosis not present

## 2021-05-29 NOTE — Telephone Encounter (Signed)
complete

## 2021-06-11 ENCOUNTER — Ambulatory Visit: Payer: Medicare HMO

## 2021-06-18 ENCOUNTER — Ambulatory Visit: Payer: Medicare HMO

## 2021-07-08 DIAGNOSIS — H524 Presbyopia: Secondary | ICD-10-CM | POA: Diagnosis not present

## 2021-07-22 DIAGNOSIS — G4733 Obstructive sleep apnea (adult) (pediatric): Secondary | ICD-10-CM | POA: Diagnosis not present

## 2021-07-30 ENCOUNTER — Other Ambulatory Visit: Payer: Self-pay

## 2021-07-30 ENCOUNTER — Ambulatory Visit (INDEPENDENT_AMBULATORY_CARE_PROVIDER_SITE_OTHER): Payer: BC Managed Care – PPO

## 2021-07-30 VITALS — BP 122/78 | HR 89 | Temp 98.7°F | Resp 16 | Ht 67.0 in | Wt 264.6 lb

## 2021-07-30 DIAGNOSIS — Z1231 Encounter for screening mammogram for malignant neoplasm of breast: Secondary | ICD-10-CM

## 2021-07-30 DIAGNOSIS — Z23 Encounter for immunization: Secondary | ICD-10-CM

## 2021-07-30 DIAGNOSIS — Z0000 Encounter for general adult medical examination without abnormal findings: Secondary | ICD-10-CM | POA: Diagnosis not present

## 2021-07-30 DIAGNOSIS — Z599 Problem related to housing and economic circumstances, unspecified: Secondary | ICD-10-CM

## 2021-07-30 NOTE — Progress Notes (Signed)
Subjective:   Kimberly Cowan is a 55 y.o. female who presents for Medicare Annual (Subsequent) preventive examination.  Review of Systems     Cardiac Risk Factors include: obesity (BMI >30kg/m2)     Objective:    Today's Vitals   07/30/21 1533 07/30/21 1534  BP: 122/78   Pulse: 89   Resp: 16   Temp: 98.7 F (37.1 C)   TempSrc: Oral   SpO2: 98%   Weight: 264 lb 9.6 oz (120 kg)   Height: 5' 7"  (1.702 m)   PainSc:  7    Body mass index is 41.44 kg/m.  Advanced Directives 07/30/2021 03/03/2021 01/16/2021 06/10/2020 03/13/2019 10/10/2018 06/23/2018  Does Patient Have a Medical Advance Directive? Yes Yes No Yes No No No  Type of Paramedic of [place];Living will Healthcare Power of [place];Living will - - -  Copy of [place] in Chart? No - copy requested - - No - copy requested - - -  Would patient like information on creating a medical advance directive? - - - - Yes (MAU/Ambulatory/Procedural Areas - Information given) No - Patient declined No - Patient declined  Pre-existing out of facility DNR order (yellow form or pink MOST form) - - - - - - -    Current Medications (verified) Outpatient Encounter Medications as of 07/30/2021  Medication Sig   cyanocobalamin (,VITAMIN B-12,) 1000 MCG/ML injection ADMINISTER 1 ML(1000 MCG) IN THE MUSCLE EVERY 30 DAYS   cyclobenzaprine (FLEXERIL) 10 MG tablet Take 10 mg by mouth daily as needed for muscle spasms.   fluticasone (FLONASE) 50 MCG/ACT nasal spray NEW PRESCRIPTION REQUEST: INSTILL ONE SPRAY IN EACH NOSTRIL EVERY DAY AS NEEDED   gabapentin (NEURONTIN) 300 MG capsule Take 2 capsules by mouth at bedtime.   ketoconazole (NIZORAL) 2 % shampoo Apply 1 application topically daily as needed (for skin fungus on neck/back).   latanoprost (XALATAN) 0.005 % ophthalmic solution Place 1 drop into both eyes at bedtime.    levocetirizine (XYZAL) 5 MG tablet TAKE 1  TABLET(5 MG) BY MOUTH EVERY EVENING   oxyCODONE-acetaminophen (PERCOCET/ROXICET) 5-325 MG tablet Take 1 tablet by mouth every 6 (six) hours as needed for moderate pain.   pantoprazole (PROTONIX) 40 MG tablet NEW PRESCRIPTION REQUEST: TAKE ONE TABLET BY MOUTH EVERY DAY   potassium chloride (KLOR-CON) 10 MEQ tablet NEW PRESCRIPTION REQUEST: TAKE ONE TABLET BY MOUTH TWICE DAILY   rizatriptan (MAXALT-MLT) 10 MG disintegrating tablet DISSOLVE 1 TABLET BY MOUTH AS NEEDED FOR MIGRAINE- MAY REPEAT IN 2 HOURS AS NEEDED (Patient taking differently: Take 10 mg by mouth daily as needed for migraine. (repeat after 2 hours if needed))   Suvorexant (BELSOMRA) 20 MG TABS Take 20 mg by mouth at bedtime.   Facility-Administered Encounter Medications as of 07/30/2021  Medication   cyanocobalamin ((VITAMIN B-12)) injection 1,000 mcg    Allergies (verified) Morphine and related, Penicillins, Latex, and Baclofen   History: Past Medical History:  Diagnosis Date   Allergy 1999   SEASONAL   Anemia    Anxiety    Arm pain, left 05/17/2018   Arthritis    lower spine   Bilateral lumbar radiculopathy    Chronic back pain    Chronic GERD 03/03/2021   Clinical depression 04/04/2012   Depression    DVT (deep venous thrombosis) ([place])    Encounter for screening colonoscopy    Essential (primary) hypertension 06/04/2020   Glaucoma    RIGHT EYE  Headache(784.0)    migraines   Major depressive disorder with single episode, in full remission ([place]) 08/15/2018   Ovarian cyst    Pneumonia    walking pneumonia   Sleep apnea    no cpcap     last sleep study > 4 yrs   Past Surgical History:  Procedure Laterality Date   ABDOMINAL HYSTERECTOMY  10/2006   BACK SURGERY  2014   lumbar fusion   BACK SURGERY  2009   discectomy   BARIATRIC SURGERY  07/2020   Revision   [place]   COLONOSCOPY WITH PROPOFOL N/A 04/12/2018   Procedure: COLONOSCOPY WITH PROPOFOL;  Surgeon: Lin Landsman, MD;   Location: [facility];  Service: Gastroenterology;  Laterality: N/A;   GASTRIC BYPASS  2005   KNEE ARTHROSCOPY WITH LATERAL MENISECTOMY Left 05/27/2017   Procedure: KNEE ARTHROSCOPY WITH LATERAL MENISECTOMY;  Surgeon: Hessie Knows, MD;  Location: [facility];  Service: Orthopedics;  Laterality: Left;   KNEE ARTHROSCOPY WITH LATERAL RELEASE Left 05/27/2017   Procedure: KNEE ARTHROSCOPY WITH LATERAL RELEASE;  Surgeon: Hessie Knows, MD;  Location: [facility];  Service: Orthopedics;  Laterality: Left;   LAPAROSCOPIC SALPINGO OOPHERECTOMY Bilateral 10/05/2016   Procedure: LAPAROSCOPIC SALPINGO OOPHORECTOMY;  Surgeon: Brayton Mars, MD;  Location: [facility];  Service: Gynecology;  Laterality: Bilateral;   REDUCTION MAMMAPLASTY Bilateral    2001   SPINAL CORD STIMULATOR INSERTION N/A 03/20/2016   Procedure: LUMBAR SPINAL CORD STIMULATOR INSERTION;  Surgeon: Eustace Moore, MD;  Location: [facility];  Service: Neurosurgery;  Laterality: N/A;   [place] SURGERY  2009 2014 2017   TONSILLECTOMY     Family History  Problem Relation Age of Onset   Diabetes Mother    Hypertension Mother    Obesity Mother    Arthritis Mother    Varicose Veins Mother    Diabetes Sister    Hypertension Sister    Arthritis Sister    Hypertension Father    Arthritis Father    Cancer Father    Obesity Father    Obesity Daughter    Obesity Sister    Arthritis Sister    Obesity Sister    Diabetes Maternal Grandmother    Cancer Maternal Aunt    Diabetes Maternal Aunt    Breast cancer Neg Hx    Social History   Socioeconomic History   Marital status: Widowed    Spouse name: Not on file   Number of children: 2   Years of education: some college   Highest education level: 12th grade  Occupational History   Occupation: disabled  Tobacco Use   Smoking status: Never   Smokeless tobacco: Never   Tobacco comments:    smoking cessation materials not required  Vaping Use   Vaping Use: Never used  Substance and  Sexual Activity   Alcohol use: No   Drug use: No   Sexual activity: Not Currently    Birth control/protection: Surgical  Other Topics Concern   Not on file  Social History Narrative   Pt lives alone   Social Determinants of Health   Financial Resource Strain: High Risk   Difficulty of Paying Living Expenses: Hard  Food Insecurity: No Food Insecurity   Worried About Running Out of Food in the Last Year: Never true   Ran Out of Food in the Last Year: Never true  Transportation Needs: No Transportation Needs   Lack of Transportation (Medical): No   Lack of Transportation (Non-Medical):  No  Physical Activity: Inactive   Days of Exercise per Week: 0 days   Minutes of Exercise per Session: 0 min  Stress: Stress Concern Present   Feeling of Stress : Rather much  Social Connections: Moderately Isolated   Frequency of Communication with Friends and Family: More than three times a week   Frequency of Social Gatherings with Friends and Family: Three times a week   Attends Religious Services: More than 4 times per year   Active Member of Clubs or Organizations: No   Attends Archivist Meetings: Never   Marital Status: Widowed    Tobacco Counseling Counseling given: Not Answered Tobacco comments: smoking cessation materials not required   Clinical Intake:  Pre-visit preparation completed: Yes  Pain : 0-10 Pain Score: 7  Pain Type: Chronic pain Pain Location: Back Pain Orientation: Lower Pain Descriptors / Indicators: Aching, Sore Pain Onset: More than a month ago Pain Frequency: Constant     BMI - recorded: 41.44 Nutritional Status: BMI > 30  Obese Nutritional Risks: None Diabetes: No  How often do you need to have someone help you when you read instructions, pamphlets, or other written materials from your doctor or pharmacy?: 1 - Never   Interpreter Needed?: No  Information entered by :: Clemetine Marker LPN   Activities of Daily Living In your present  state of health, do you have any difficulty performing the following activities: 07/30/2021  Hearing? N  Vision? N  Difficulty concentrating or making decisions? N  Walking or climbing stairs? N  Dressing or bathing? N  Doing errands, shopping? N  Preparing Food and eating ? N  Using the Toilet? N  In the past six months, have you accidently leaked urine? N  Do you have problems with loss of bowel control? N  Managing your Medications? N  Managing your Finances? N  Housekeeping or managing your Housekeeping? N  Some recent data might be hidden    Patient Care Team: Glean Hess, MD as PCP - General (Internal Medicine) Marlaine Hind, MD as Consulting Physician (Physical Medicine and Rehabilitation) Pilati, Youlanda Roys, MD as Referring Physician [facility])  Indicate any recent Medical Services you may have received from other than [facility] providers in the past year (date may be approximate).     Assessment:   This is a routine wellness examination for [place].  Hearing/Vision screen Hearing Screening - Comments:: Pt denies hearing difficulty  Vision Screening - Comments:: Annual vision screenings with Dr. Gloriann Loan  Dietary issues and exercise activities discussed: Current Exercise Habits: The patient does not participate in regular exercise at present, Exercise limited by: orthopedic condition(s)   Goals Addressed             This Visit's Progress    Exercise 150 min/wk Moderate Activity   Not on track    Recommend to exercise for at least 150 minutes per week.     Weight (lb) < 200 lb (90.7 kg)   264 lb 9.6 oz (120 kg)    Pt states she would like to lose weight with healthy eating and physical activity        Depression Screen PHQ 2/9 Scores 07/30/2021 11/25/2020 07/24/2020 07/11/2020 06/10/2020 04/18/2020 01/16/2020  PHQ - 2 Score 0 0 0 0 0 0 0  PHQ- 9 Score 6 0 0 0 - 0 0  Exception Documentation - - - - - - -    Fall Risk Fall Risk  07/30/2021 11/25/2020 07/11/2020  06/10/2020 04/18/2020  Falls in the past year? 0 0 0 0 0  Number falls in past yr: 0 - 0 0 -  Injury with Fall? 0 - 0 0 -  Comment - - - - -  Risk Factor Category  - - - - -  Risk for fall due to : No Fall Risks - No Fall Risks No Fall Risks No Fall Risks  Risk for fall due to: Comment - - - - -  Follow up Falls prevention discussed Falls evaluation completed Falls evaluation completed Falls prevention discussed Falls evaluation completed    FALL RISK PREVENTION PERTAINING TO THE HOME:  Any stairs in or around the home? No  If so, are there any without handrails? No  Home free of loose throw rugs in walkways, pet beds, electrical cords, etc? Yes  Adequate lighting in your home to reduce risk of falls? Yes   ASSISTIVE DEVICES UTILIZED TO PREVENT FALLS:  Life alert? No  Use of a cane, walker or w/c? No  Grab bars in the bathroom? Yes  Shower chair or bench in shower? Yes Elevated toilet seat or a handicapped toilet? Yes   TIMED UP AND GO:  Was the test performed? Yes .  Length of time to ambulate 10 feet: 5 sec.   Gait steady and fast without use of assistive device  Cognitive Function: Normal cognitive status assessed by direct observation by this Nurse Health Advisor. No abnormalities found.       6CIT Screen 03/13/2019 03/09/2018  What Year? 0 points 0 points  What month? 0 points 0 points  What time? 0 points 0 points  Count back from 20 0 points 0 points  Months in reverse 0 points 0 points  Repeat phrase 0 points 0 points  Total Score 0 0    Immunizations Immunization History  Administered Date(s) Administered   Influenza,inj,Quad PF,6+ Mos 08/14/2016, 11/15/2017, 08/15/2018, 07/07/2019, 07/24/2020, 07/30/2021   MMR 10/20/2002   PFIZER(Purple Top)SARS-COV-2 Vaccination 12/24/2019, 01/16/2020, 08/04/2020   Td 08/18/1991, 10/20/2002   Tdap 04/24/2014   Zoster Recombinat (Shingrix) 01/09/2019, 03/13/2019    TDAP status: Up to date  Flu Vaccine status:  Completed at today's visit  Pneumococcal vaccine status: Due, Education has been provided regarding the importance of this vaccine. Advised may receive this vaccine at local pharmacy or Health Dept. Aware to provide a copy of the vaccination record if obtained from local pharmacy or Health Dept. Verbalized acceptance and understanding.  Covid-19 vaccine status: Completed vaccines  Qualifies for Shingles Vaccine? Yes   Zostavax completed No   Shingrix Completed?: Yes  Screening Tests Health Maintenance  Topic Date Due   Pneumococcal Vaccine 28-34 Years old (1 - PCV) Never done   Hepatitis C Screening  Never done   COVID-19 Vaccine (4 - Booster for Pfizer series) 09/29/2020   MAMMOGRAM  04/30/2021   TETANUS/TDAP  04/24/2024   COLONOSCOPY (Pts 45-24yr Insurance coverage will need to be confirmed)  04/12/2028   INFLUENZA VACCINE  Completed   HIV Screening  Completed   Zoster Vaccines- Shingrix  Completed   HPV VACCINES  Aged Out    Health Maintenance  Health Maintenance Due  Topic Date Due   Pneumococcal Vaccine 135680Years old (1 - PCV) Never done   Hepatitis C Screening  Never done   COVID-19 Vaccine (4 - Booster for Pfizer series) 09/29/2020   MAMMOGRAM  04/30/2021    Colorectal cancer screening: Type of screening: Colonoscopy. Completed 04/12/18. Repeat every 10 years  Mammogram  status: Completed 04/30/20. Repeat every year. Ordered today  Bone density screening: due age 43  Lung Cancer Screening: (Low Dose CT Chest recommended if Age 25-80 years, 30 pack-year currently smoking OR have quit w/in 15years.) does not qualify.   Additional Screening:  Hepatitis C Screening: does qualify; postponed  Vision Screening: Recommended annual ophthalmology exams for early detection of glaucoma and other disorders of the eye. Is the patient up to date with their annual eye exam?  Yes  Who is the provider or what is the name of the office in which the patient attends annual eye  exams? Dr. Gloriann Loan.   Dental Screening: Recommended annual dental exams for proper oral hygiene  Community Resource Referral / Chronic Care Management: CRR required this visit?  Yes  - housing  CCM required this visit?  No      Plan:     I have personally reviewed and noted the following in the patient's chart:   Medical and social history Use of alcohol, tobacco or illicit drugs  Current medications and supplements including opioid prescriptions.  Functional ability and status Nutritional status Physical activity Advanced directives List of other physicians Hospitalizations, surgeries, and ER visits in previous 12 months Vitals Screenings to include cognitive, depression, and falls Referrals and appointments  In addition, I have reviewed and discussed with patient certain preventive protocols, quality metrics, and best practice recommendations. A written personalized care plan for preventive services as well as general preventive health recommendations were provided to patient.     Clemetine Marker, LPN   [id number]/04/2517   Nurse Notes: none

## 2021-07-30 NOTE — Patient Instructions (Signed)
Ms. Castrellon , Thank you for taking time to come for your Medicare Wellness Visit. I appreciate your ongoing commitment to your health goals. Please review the following plan we discussed and let me know if I can assist you in the future.   Screening recommendations/referrals: Colonoscopy: done 04/12/18. Repeat 03/2028 Mammogram: done 04/30/20 Bone Density: due age 55 Recommended yearly ophthalmology/optometry visit for glaucoma screening and checkup Recommended yearly dental visit for hygiene and checkup  Vaccinations: Influenza vaccine: done today Pneumococcal vaccine: due Tdap vaccine: done 04/24/14 Shingles vaccine: done 01/09/19 & 03/13/19  Covid-19: done 12/24/19, 01/16/20 & 08/04/20  Advanced directives: Please bring a copy of your health care power of attorney and living will to the office at your convenience.   Conditions/risks identified: Recommend healthy eating and physical activity for desired weight loss   Next appointment: Follow up in one year for your annual wellness visit.   Preventive Care 40-64 Years, Female Preventive care refers to lifestyle choices and visits with your health care provider that can promote health and wellness. What does preventive care include? A yearly physical exam. This is also called an annual well check. Dental exams once or twice a year. Routine eye exams. Ask your health care provider how often you should have your eyes checked. Personal lifestyle choices, including: Daily care of your teeth and gums. Regular physical activity. Eating a healthy diet. Avoiding tobacco and drug use. Limiting alcohol use. Practicing safe sex. Taking low-dose aspirin daily starting at age 83. Taking vitamin and mineral supplements as recommended by your health care provider. What happens during an annual well check? The services and screenings done by your health care provider during your annual well check will depend on your age, overall health, lifestyle  risk factors, and family history of disease. Counseling  Your health care provider may ask you questions about your: Alcohol use. Tobacco use. Drug use. Emotional well-being. Home and relationship well-being. Sexual activity. Eating habits. Work and work Statistician. Method of birth control. Menstrual cycle. Pregnancy history. Screening  You may have the following tests or measurements: Height, weight, and BMI. Blood pressure. Lipid and cholesterol levels. These may be checked every 5 years, or more frequently if you are over 94 years old. Skin check. Lung cancer screening. You may have this screening every year starting at age 84 if you have a 30-pack-year history of smoking and currently smoke or have quit within the past 15 years. Fecal occult blood test (FOBT) of the stool. You may have this test every year starting at age 67. Flexible sigmoidoscopy or colonoscopy. You may have a sigmoidoscopy every 5 years or a colonoscopy every 10 years starting at age 40. Hepatitis C blood test. Hepatitis B blood test. Sexually transmitted disease (STD) testing. Diabetes screening. This is done by checking your blood sugar (glucose) after you have not eaten for a while (fasting). You may have this done every 1-3 years. Mammogram. This may be done every 1-2 years. Talk to your health care provider about when you should start having regular mammograms. This may depend on whether you have a family history of breast cancer. BRCA-related cancer screening. This may be done if you have a family history of breast, ovarian, tubal, or peritoneal cancers. Pelvic exam and Pap test. This may be done every 3 years starting at age 30. Starting at age 28, this may be done every 5 years if you have a Pap test in combination with an HPV test. Bone density scan. This is  done to screen for osteoporosis. You may have this scan if you are at high risk for osteoporosis. Discuss your test results, treatment options,  and if necessary, the need for more tests with your health care provider. Vaccines  Your health care provider may recommend certain vaccines, such as: Influenza vaccine. This is recommended every year. Tetanus, diphtheria, and acellular pertussis (Tdap, Td) vaccine. You may need a Td booster every 10 years. Zoster vaccine. You may need this after age 40. Pneumococcal 13-valent conjugate (PCV13) vaccine. You may need this if you have certain conditions and were not previously vaccinated. Pneumococcal polysaccharide (PPSV23) vaccine. You may need one or two doses if you smoke cigarettes or if you have certain conditions. Talk to your health care provider about which screenings and vaccines you need and how often you need them. This information is not intended to replace advice given to you by your health care provider. Make sure you discuss any questions you have with your health care provider. Document Released: 10/11/2015 Document Revised: 06/03/2016 Document Reviewed: 07/16/2015 Elsevier Interactive Patient Education  2017 [place] Prevention in the Home Falls can cause injuries. They can happen to people of all ages. There are many things you can do to make your home safe and to help prevent falls. What can I do on the outside of my home? Regularly fix the edges of walkways and driveways and fix any cracks. Remove anything that might make you trip as you walk through a door, such as a raised step or threshold. Trim any bushes or trees on the path to your home. Use bright outdoor lighting. Clear any walking paths of anything that might make someone trip, such as rocks or tools. Regularly check to see if handrails are loose or broken. Make sure that both sides of any steps have handrails. Any raised decks and porches should have guardrails on the edges. Have any leaves, snow, or ice cleared regularly. Use sand or salt on walking paths during winter. Clean up any spills in  your garage right away. This includes oil or grease spills. What can I do in the bathroom? Use night lights. Install grab bars by the toilet and in the tub and shower. Do not use towel bars as grab bars. Use non-skid mats or decals in the tub or shower. If you need to sit down in the shower, use a plastic, non-slip stool. Keep the floor dry. Clean up any water that spills on the floor as soon as it happens. Remove soap buildup in the tub or shower regularly. Attach bath mats securely with double-sided non-slip rug tape. Do not have throw rugs and other things on the floor that can make you trip. What can I do in the bedroom? Use night lights. Make sure that you have a light by your bed that is easy to reach. Do not use any sheets or blankets that are too big for your bed. They should not hang down onto the floor. Have a firm chair that has side arms. You can use this for support while you get dressed. Do not have throw rugs and other things on the floor that can make you trip. What can I do in the kitchen? Clean up any spills right away. Avoid walking on wet floors. Keep items that you use a lot in easy-to-reach places. If you need to reach something above you, use a strong step stool that has a grab bar. Keep electrical cords out of  the way. Do not use floor polish or wax that makes floors slippery. If you must use wax, use non-skid floor wax. Do not have throw rugs and other things on the floor that can make you trip. What can I do with my stairs? Do not leave any items on the stairs. Make sure that there are handrails on both sides of the stairs and use them. Fix handrails that are broken or loose. Make sure that handrails are as long as the stairways. Check any carpeting to make sure that it is firmly attached to the stairs. Fix any carpet that is loose or worn. Avoid having throw rugs at the top or bottom of the stairs. If you do have throw rugs, attach them to the floor with carpet  tape. Make sure that you have a light switch at the top of the stairs and the bottom of the stairs. If you do not have them, ask someone to add them for you. What else can I do to help prevent falls? Wear shoes that: Do not have high heels. Have rubber bottoms. Are comfortable and fit you well. Are closed at the toe. Do not wear sandals. If you use a stepladder: Make sure that it is fully opened. Do not climb a closed stepladder. Make sure that both sides of the stepladder are locked into place. Ask someone to hold it for you, if possible. Clearly mark and make sure that you can see: Any grab bars or handrails. First and last steps. Where the edge of each step is. Use tools that help you move around (mobility aids) if they are needed. These include: Canes. Walkers. Scooters. Crutches. Turn on the lights when you go into a dark area. Replace any light bulbs as soon as they burn out. Set up your furniture so you have a clear path. Avoid moving your furniture around. If any of your floors are uneven, fix them. If there are any pets around you, be aware of where they are. Review your medicines with your doctor. Some medicines can make you feel dizzy. This can increase your chance of falling. Ask your doctor what other things that you can do to help prevent falls. This information is not intended to replace advice given to you by your health care provider. Make sure you discuss any questions you have with your health care provider. Document Released: 07/11/2009 Document Revised: 02/20/2016 Document Reviewed: 10/19/2014 Elsevier Interactive Patient Education  2017 [employer].

## 2021-08-06 ENCOUNTER — Telehealth: Payer: Self-pay

## 2021-08-06 NOTE — Telephone Encounter (Signed)
   Telephone encounter was:  Successful.  08/06/2021 Name: Kimberly Cowan MRN: [id number] DOB: 08-Oct-1965  Kimberly Cowan is a 55 y.o. year old female who is a primary care patient of Army Melia Jesse Sans, MD . The community resource team was consulted for assistance with  housing.  Care guide performed the following interventions: Spoke with patient confirmed email address sent list of low-income housing and shelters.  Patient has already applied with [employer] and [employer] both have waitlist that are at least a year. She has also checked local shelter and there is no availability.  Follow Up Plan:  Care guide will follow up with patient by phone over the next 4-00 days  Tajuanna Burnett, AAS Paralegal, [employer]  [address], [place] [address] ??[email or web address]  ?? [phone]   [email or web address]

## 2021-08-18 DIAGNOSIS — E538 Deficiency of other specified B group vitamins: Secondary | ICD-10-CM | POA: Diagnosis not present

## 2021-08-18 DIAGNOSIS — Z6839 Body mass index (BMI) 39.0-39.9, adult: Secondary | ICD-10-CM | POA: Diagnosis not present

## 2021-08-18 DIAGNOSIS — Z9884 Bariatric surgery status: Secondary | ICD-10-CM | POA: Diagnosis not present

## 2021-08-18 DIAGNOSIS — E611 Iron deficiency: Secondary | ICD-10-CM | POA: Diagnosis not present

## 2021-08-18 DIAGNOSIS — Z9049 Acquired absence of other specified parts of digestive tract: Secondary | ICD-10-CM | POA: Diagnosis not present

## 2021-08-19 ENCOUNTER — Telehealth: Payer: Self-pay

## 2021-08-19 NOTE — Telephone Encounter (Signed)
   Telephone encounter was:  Successful.  08/19/2021 Name: Kimberly Cowan MRN: [id number] DOB: 1965-11-21  Kimberly Cowan is a 55 y.o. year old female who is a primary care patient of Army Melia Jesse Sans, MD . The community resource team was consulted for assistance with  housing.  Care guide performed the following interventions: Spoke with patient she has exhausted all the resources given there is no availability in the shelter and all the housing authorities have waitlist.   Follow Up Plan:  No further follow up planned at this time. The patient has been provided with needed resources.  Demitrius Crass, AAS Paralegal, [employer]  [address], [place] [address] ??[email or web address]  ?? [phone]   [email or web address]

## 2021-09-03 ENCOUNTER — Other Ambulatory Visit: Payer: Self-pay | Admitting: Internal Medicine

## 2021-09-03 DIAGNOSIS — F5101 Primary insomnia: Secondary | ICD-10-CM

## 2021-09-03 NOTE — Telephone Encounter (Signed)
Requested Prescriptions  Pending Prescriptions Disp Refills  . pantoprazole (PROTONIX) 40 MG tablet [Pharmacy Med Name: pantoprazole 40 mg tablet,delayed release] 90 tablet 0    Sig: TAKE 1 TABLET BY MOUTH ONCE DAILY     Gastroenterology: Proton Pump Inhibitors Passed - 09/03/2021 10:49 AM      Passed - Valid encounter within last 12 months    Recent Outpatient Visits          9 months ago Prediabetes   [facility] Glean Hess, MD   1 year ago Prediabetes   [facility] Glean Hess, MD   1 year ago Acute non-recurrent maxillary sinusitis   [facility] Glean Hess, MD   1 year ago Primary insomnia   [facility] Glean Hess, MD   1 year ago Annual physical exam   [facility] Glean Hess, MD      Future Appointments            In 4 weeks Glean Hess, MD [facility], [place]           . potassium chloride (KLOR-CON M) 10 MEQ tablet [Pharmacy Med Name: potassium chloride ER 10 mEq tablet,extended release(part/cryst)] 180 tablet 0    Sig: TAKE 1 TABLET BY MOUTH TWICE DAILY     Endocrinology:  Minerals - Potassium Supplementation Failed - 09/03/2021 10:49 AM      Failed - K in normal range and within 360 days    Potassium  Date Value Ref Range Status  03/03/2021 3.3 (L) 3.5 - 5.1 mmol/L Final  10/31/2014 3.8 3.5 - 5.1 mmol/L Final         Passed - Cr in normal range and within 360 days    Creatinine  Date Value Ref Range Status  10/31/2014 0.69 0.60 - 1.30 mg/dL Final   Creatinine, Ser  Date Value Ref Range Status  03/03/2021 0.77 0.44 - 1.00 mg/dL Final         Passed - Valid encounter within last 12 months    Recent Outpatient Visits          9 months ago Prediabetes   [facility] Glean Hess, MD   1 year ago Prediabetes   [facility] Glean Hess, MD   1 year ago Acute non-recurrent maxillary sinusitis   [facility] Glean Hess, MD   1 year ago Primary insomnia   [facility] Glean Hess, MD   1 year ago Annual physical exam   [facility] Glean Hess, MD      Future Appointments            In 4 weeks Army Melia Jesse Sans, MD [facility], [place]           . BELSOMRA 20 MG TABS [Pharmacy Med Name: Belsomra 20 mg tablet] 30 tablet 2    Sig: TAKE 1 TABLET BY MOUTH AT BEDTIME     Off-Protocol Failed - 09/03/2021 10:49 AM      Failed - Medication not assigned to a protocol, review manually.      Passed - Valid encounter within last 12 months    Recent Outpatient Visits          9 months ago Prediabetes   [facility] Glean Hess, MD   1 year ago Prediabetes   [facility] Glean Hess, MD   1 year ago  Acute non-recurrent maxillary sinusitis   [facility] Glean Hess, MD   1 year ago Primary insomnia   [facility] Glean Hess, MD   1 year ago Annual physical exam   [facility] Glean Hess, MD      Future Appointments            In 4 weeks Army Melia Jesse Sans, MD [facility], [facility]

## 2021-09-03 NOTE — Telephone Encounter (Signed)
Requested medication (s) are due for refill today: yes  Requested medication (s) are on the active medication list: yes  Last refill:  04/18/21 #90 0 refills   Future visit scheduled: yes in 4 weeks  Notes to clinic:  medication not assigned to a protocol. Do you want to refill Rx?     Requested Prescriptions  Pending Prescriptions Disp Refills   BELSOMRA 20 MG TABS [Pharmacy Med Name: Belsomra 20 mg tablet] 30 tablet 2    Sig: TAKE 1 TABLET BY MOUTH AT BEDTIME     Off-Protocol Failed - 09/03/2021 10:49 AM      Failed - Medication not assigned to a protocol, review manually.      Passed - Valid encounter within last 12 months    Recent Outpatient Visits           9 months ago Prediabetes   [facility] Glean Hess, MD   1 year ago Prediabetes   [facility] Glean Hess, MD   1 year ago Acute non-recurrent maxillary sinusitis   [facility] Glean Hess, MD   1 year ago Primary insomnia   [facility] Glean Hess, MD   1 year ago Annual physical exam   [facility] Glean Hess, MD       Future Appointments             In 4 weeks Army Melia Jesse Sans, MD [facility]            Signed Prescriptions Disp Refills   pantoprazole (PROTONIX) 40 MG tablet 90 tablet 0    Sig: TAKE 1 TABLET BY MOUTH ONCE DAILY     Gastroenterology: Proton Pump Inhibitors Passed - 09/03/2021 10:49 AM      Passed - Valid encounter within last 12 months    Recent Outpatient Visits           9 months ago Prediabetes   [facility] Glean Hess, MD   1 year ago Prediabetes   [facility] Glean Hess, MD   1 year ago Acute non-recurrent maxillary sinusitis   [facility] Glean Hess, MD   1 year ago Primary insomnia   [facility] Glean Hess, MD   1 year ago Annual physical exam   [facility] Glean Hess, MD       Future  Appointments             In 4 weeks Glean Hess, MD [facility]             potassium chloride (KLOR-CON M) 10 MEQ tablet 180 tablet 0    Sig: TAKE 1 TABLET BY MOUTH TWICE DAILY     Endocrinology:  Minerals - Potassium Supplementation Failed - 09/03/2021 10:49 AM      Failed - K in normal range and within 360 days    Potassium  Date Value Ref Range Status  03/03/2021 3.3 (L) 3.5 - 5.1 mmol/L Final  10/31/2014 3.8 3.5 - 5.1 mmol/L Final          Passed - Cr in normal range and within 360 days    Creatinine  Date Value Ref Range Status  10/31/2014 0.69 0.60 - 1.30 mg/dL Final   Creatinine, Ser  Date Value Ref Range Status  03/03/2021 0.77 0.44 - 1.00 mg/dL Final          Passed -  Valid encounter within last 12 months    Recent Outpatient Visits           9 months ago Prediabetes   [facility] Glean Hess, MD   1 year ago Prediabetes   [facility] Glean Hess, MD   1 year ago Acute non-recurrent maxillary sinusitis   [facility] Glean Hess, MD   1 year ago Primary insomnia   [facility] Glean Hess, MD   1 year ago Annual physical exam   [facility] Glean Hess, MD       Future Appointments             In 4 weeks Army Melia Jesse Sans, MD [facility], [facility]

## 2021-09-19 ENCOUNTER — Encounter: Payer: Medicare HMO | Admitting: Internal Medicine

## 2021-10-02 ENCOUNTER — Other Ambulatory Visit: Payer: Self-pay | Admitting: Internal Medicine

## 2021-10-02 ENCOUNTER — Other Ambulatory Visit: Payer: Self-pay

## 2021-10-02 ENCOUNTER — Ambulatory Visit (INDEPENDENT_AMBULATORY_CARE_PROVIDER_SITE_OTHER): Payer: BC Managed Care – PPO | Admitting: Internal Medicine

## 2021-10-02 ENCOUNTER — Encounter: Payer: Self-pay | Admitting: Internal Medicine

## 2021-10-02 VITALS — BP 122/78 | HR 79 | Ht 67.0 in | Wt 250.0 lb

## 2021-10-02 DIAGNOSIS — R7303 Prediabetes: Secondary | ICD-10-CM | POA: Diagnosis not present

## 2021-10-02 DIAGNOSIS — E538 Deficiency of other specified B group vitamins: Secondary | ICD-10-CM

## 2021-10-02 DIAGNOSIS — R600 Localized edema: Secondary | ICD-10-CM

## 2021-10-02 DIAGNOSIS — J3089 Other allergic rhinitis: Secondary | ICD-10-CM | POA: Diagnosis not present

## 2021-10-02 DIAGNOSIS — Z0000 Encounter for general adult medical examination without abnormal findings: Secondary | ICD-10-CM

## 2021-10-02 DIAGNOSIS — Z1231 Encounter for screening mammogram for malignant neoplasm of breast: Secondary | ICD-10-CM | POA: Diagnosis not present

## 2021-10-02 MED ORDER — LEVOCETIRIZINE DIHYDROCHLORIDE 5 MG PO TABS: 5.0000 mg | ORAL_TABLET | Freq: Every evening | ORAL | 2 refills | Status: DC

## 2021-10-02 MED ORDER — FUROSEMIDE 20 MG PO TABS: 10.0000 mg | ORAL_TABLET | Freq: Every day | ORAL | 0 refills | Status: DC | PRN

## 2021-10-02 NOTE — Progress Notes (Signed)
Date:  10/02/2021   Name:  Kimberly Cowan   DOB:  October 29, 1965   MRN:  [id number]   Chief Complaint: Annual Exam (Breast Exam. No pap.) Kimberly Cowan is a 56 y.o. female who presents today for her Complete Annual Exam. She feels well. She reports exercising - none. She reports she is sleeping fairly well. Breast complaints - none. She has recovered from her infection last year post gastric bypass revision.  Now back to work.  Still losing weight gradually.  No new issues.  Mammogram: 04/2020 DEXA: none Pap smear: discontinued Colonoscopy: 03/2018  Immunization History  Administered Date(s) Administered   Influenza,inj,Quad PF,6+ Mos 08/14/2016, 11/15/2017, 08/15/2018, 07/07/2019, 07/24/2020, 07/30/2021   MMR 10/20/2002   PFIZER(Purple Top)SARS-COV-2 Vaccination 12/24/2019, 01/16/2020, 08/04/2020, 08/17/2021   Td 08/18/1991, 10/20/2002   Tdap 04/24/2014   Zoster Recombinat (Shingrix) 01/09/2019, 03/13/2019    Migraine  This is a recurrent problem. The problem occurs seasonly. She has tried triptans for the symptoms. The treatment provided significant relief.  Gastroesophageal Reflux She complains of heartburn. This is a recurrent problem. The problem occurs rarely. She has tried a PPI for the symptoms. The treatment provided significant relief.   Lab Results  Component Value Date   NA 137 03/03/2021   K 3.3 (L) 03/03/2021   CO2 26 03/03/2021   GLUCOSE 126 (H) 03/03/2021   BUN 13 03/03/2021   CREATININE 0.77 03/03/2021   CALCIUM 8.6 (L) 03/03/2021   EGFR 105 11/25/2020   GFRNONAA >60 03/03/2021   Lab Results  Component Value Date   CHOL 138 01/16/2020   HDL 65 01/16/2020   LDLCALC 60 01/16/2020   TRIG 61 01/16/2020   CHOLHDL 2.1 01/16/2020   Lab Results  Component Value Date   TSH 2.150 01/16/2020   Lab Results  Component Value Date   HGBA1C 6.1 (H) 11/25/2020   Lab Results  Component Value Date   WBC 13.6 (H) 03/03/2021   HGB 8.8  (L) 03/03/2021   HCT 26.8 (L) 03/03/2021   MCV 87.3 03/03/2021   PLT 384 03/03/2021   Lab Results  Component Value Date   ALT 33 03/03/2021   AST 36 03/03/2021   ALKPHOS 66 03/03/2021   BILITOT 0.9 03/03/2021   No results found for: 25OHVITD2, 25OHVITD3, VD25OH   Review of Systems  Gastrointestinal:  Positive for heartburn.   Patient Active Problem List   Diagnosis Date Noted   Chronic GERD 03/03/2021   Status post hysterectomy 11/05/2020   Essential (primary) hypertension 06/04/2020   Bariatric surgery status 01/01/2020   Primary insomnia 12/28/2019   Failed back surgical syndrome 11/27/2019   Mild peripheral edema 11/15/2017   Perforated ear drum, left 06/25/2017   History of pulmonary embolism 06/10/2017   Environmental and seasonal allergies 04/07/2017   Tinea versicolor 02/03/2017   Vasomotor symptoms due to menopause 11/11/2016   Glaucoma 09/28/2016   B12 nutritional deficiency 08/05/2016   OSA on CPAP 03/04/2016   Chronic depression 12/09/2015   Anxiety 12/09/2015   Bilateral lumbar radiculopathy 08/14/2015   Status post gastric bypass for obesity 07/04/2015   Prediabetes 07/01/2015   Spinal stenosis of lumbar region 12/10/2014   Headache, migraine 04/04/2012    Allergies  Allergen Reactions   Morphine And Related Hives, Shortness Of Breath, Nausea And Vomiting and Other (See Comments)    "can't breathe"   Penicillins Anaphylaxis, Hives, Shortness Of Breath and Nausea And Vomiting    "can't breathe", Has patient had a PCN reaction  causing immediate rash, facial/tongue/throat swelling, SOB or lightheadedness with hypotension: Yes Has patient had a PCN reaction causing severe rash involving mucus membranes or skin necrosis: No Has patient had a PCN reaction that required hospitalization No Has patient had a PCN reaction occurring within the last 10 years: No If all of the above answers are "NO", then may proceed with Cephalosporin use.    Latex Hives    Baclofen Nausea Only    Jittery, anxious    Past Surgical History:  Procedure Laterality Date   ABDOMINAL HYSTERECTOMY  10/2006   BACK SURGERY  2014   lumbar fusion   BACK SURGERY  2009   discectomy   BARIATRIC SURGERY  07/2020   Revision   [place]   COLONOSCOPY WITH PROPOFOL N/A 04/12/2018   Procedure: COLONOSCOPY WITH PROPOFOL;  Surgeon: Lin Landsman, MD;  Location: [facility] ENDOSCOPY;  Service: Gastroenterology;  Laterality: N/A;   GASTRIC BYPASS  2005   KNEE ARTHROSCOPY WITH LATERAL MENISECTOMY Left 05/27/2017   Procedure: KNEE ARTHROSCOPY WITH LATERAL MENISECTOMY;  Surgeon: Hessie Knows, MD;  Location: [facility] ORS;  Service: Orthopedics;  Laterality: Left;   KNEE ARTHROSCOPY WITH LATERAL RELEASE Left 05/27/2017   Procedure: KNEE ARTHROSCOPY WITH LATERAL RELEASE;  Surgeon: Hessie Knows, MD;  Location: [facility] ORS;  Service: Orthopedics;  Laterality: Left;   LAPAROSCOPIC SALPINGO OOPHERECTOMY Bilateral 10/05/2016   Procedure: LAPAROSCOPIC SALPINGO OOPHORECTOMY;  Surgeon: Brayton Mars, MD;  Location: [facility] ORS;  Service: Gynecology;  Laterality: Bilateral;   REDUCTION MAMMAPLASTY Bilateral    2001   SPINAL CORD STIMULATOR INSERTION N/A 03/20/2016   Procedure: LUMBAR SPINAL CORD STIMULATOR INSERTION;  Surgeon: Eustace Moore, MD;  Location: [place] [facility];  Service: Neurosurgery;  Laterality: N/A;   SPINE SURGERY  2009 2014 2017   TONSILLECTOMY      Social History   Tobacco Use   Smoking status: Never   Smokeless tobacco: Never   Tobacco comments:    smoking cessation materials not required  Vaping Use   Vaping Use: Never used  Substance Use Topics   Alcohol use: No   Drug use: No     Medication list has been reviewed and updated.  Current Meds  Medication Sig   BELSOMRA 20 MG TABS TAKE 1 TABLET BY MOUTH AT BEDTIME   cyanocobalamin (,VITAMIN B-12,) 1000 MCG/ML injection ADMINISTER 1 ML(1000 MCG) IN THE MUSCLE EVERY 30 DAYS   cyclobenzaprine  (FLEXERIL) 10 MG tablet Take 10 mg by mouth daily as needed for muscle spasms.   fluticasone (FLONASE) 50 MCG/ACT nasal spray NEW PRESCRIPTION REQUEST: INSTILL ONE SPRAY IN EACH NOSTRIL EVERY DAY AS NEEDED   furosemide (LASIX) 20 MG tablet Take 0.5 tablets (10 mg total) by mouth daily as needed.   gabapentin (NEURONTIN) 300 MG capsule Take 2 capsules by mouth at bedtime.   HYDROcodone-acetaminophen (NORCO/VICODIN) 5-325 MG tablet Take 1 tablet by mouth every 6 (six) hours as needed for moderate pain.   ketoconazole (NIZORAL) 2 % shampoo Apply 1 application topically daily as needed (for skin fungus on neck/back).   latanoprost (XALATAN) 0.005 % ophthalmic solution Place 1 drop into both eyes at bedtime.    pantoprazole (PROTONIX) 40 MG tablet TAKE 1 TABLET BY MOUTH ONCE DAILY   [DISCONTINUED] levocetirizine (XYZAL) 5 MG tablet TAKE 1 TABLET(5 MG) BY MOUTH EVERY EVENING   Current Facility-Administered Medications for the 10/02/21 encounter (Office Visit) with Glean Hess, MD  Medication   cyanocobalamin ((VITAMIN B-12)) injection 1,000  mcg    PHQ 2/9 Scores 10/02/2021 07/30/2021 11/25/2020 07/24/2020  PHQ - 2 Score 0 0 0 0  PHQ- 9 Score 4 6 0 0  Exception Documentation - - - -    GAD 7 : Generalized Anxiety Score 10/02/2021 11/25/2020 07/24/2020 04/18/2020  Nervous, Anxious, on Edge 1 0 0 0  Control/stop worrying 1 0 0 0  Worry too much - different things 1 0 0 0  Trouble relaxing 1 0 0 0  Restless 1 0 0 0  Easily annoyed or irritable 1 0 0 0  Afraid - awful might happen 1 0 0 0  Total GAD 7 Score 7 0 0 0  Anxiety Difficulty Not difficult at all - Not difficult at all Not difficult at all    BP Readings from Last 3 Encounters:  10/02/21 122/78  07/30/21 122/78  03/03/21 102/67    Physical Exam Vitals and nursing note reviewed.  Constitutional:      General: She is not in acute distress.    Appearance: She is well-developed.  HENT:     Head: Normocephalic and atraumatic.      Right Ear: Tympanic membrane and ear canal normal.     Left Ear: Tympanic membrane and ear canal normal.     Nose:     Right Sinus: No maxillary sinus tenderness.     Left Sinus: No maxillary sinus tenderness.  Eyes:     General: No scleral icterus.       Right eye: No discharge.        Left eye: No discharge.     Conjunctiva/sclera: Conjunctivae normal.  Neck:     Thyroid: No thyromegaly.     Vascular: No carotid bruit.  Cardiovascular:     Rate and Rhythm: Normal rate and regular rhythm.     Pulses: Normal pulses.     Heart sounds: Normal heart sounds.  Pulmonary:     Effort: Pulmonary effort is normal. No respiratory distress.     Breath sounds: No wheezing.  Chest:  Breasts:    Right: No mass, nipple discharge, skin change or tenderness.     Left: No mass, nipple discharge, skin change or tenderness.     Comments: Bilateral breast reduction scars Abdominal:     General: Bowel sounds are normal.     Palpations: Abdomen is soft.     Tenderness: There is no abdominal tenderness.  Musculoskeletal:     Cervical back: Normal range of motion. No erythema.     Right lower leg: No edema.     Left lower leg: Left lower leg edema: trace ankle edema.  Lymphadenopathy:     Cervical: No cervical adenopathy.  Skin:    General: Skin is warm and dry.     Capillary Refill: Capillary refill takes less than 2 seconds.     Findings: No rash.  Neurological:     General: No focal deficit present.     Mental Status: She is alert and oriented to person, place, and time.     Cranial Nerves: No cranial nerve deficit.     Sensory: No sensory deficit.     Deep Tendon Reflexes: Reflexes are normal and symmetric.  Psychiatric:        Attention and Perception: Attention normal.        Mood and Affect: Mood normal.    Wt Readings from Last 3 Encounters:  10/02/21 250 lb (113.4 kg)  07/30/21 264 lb 9.6 oz (120 kg)  03/03/21  272 lb (123.4 kg)    BP 122/78    Pulse 79    Ht _0  (1.702 m)     Wt 250 lb (113.4 kg)    SpO2 97%    BMI 39.16 kg/m   Assessment and Plan: 1. Annual physical exam Up to date on screenings and immunizations. She is not using CPAP but should probably resume. - CBC with Differential/Platelet - Lipid panel - TSH  2. Encounter for screening mammogram for breast cancer Schedule at [facility]  3. Prediabetes Check labs; continue weight loss - Comprehensive metabolic panel - Hemoglobin A1c  4. B12 nutritional deficiency Continue monthly B12 injections - Vitamin B12  5. Environmental and seasonal allergies Continue xyzal daily PRN - levocetirizine (XYZAL) 5 MG tablet; Take 1 tablet (5 mg total) by mouth every evening.  Dispense: 90 tablet; Refill: 2  6. Mild peripheral edema Can use Lasix 10 mg once a week as needed - not recommended to take daily Continue compression stockings - furosemide (LASIX) 20 MG tablet; Take 0.5 tablets (10 mg total) by mouth daily as needed.  Dispense: 30 tablet; Refill: 0   Partially dictated using Editor, commissioning. Any errors are unintentional.  Halina Maidens, MD [facility]  10/02/2021

## 2021-10-02 NOTE — Telephone Encounter (Signed)
Med was signed by provider today.  Requested Prescriptions  Pending Prescriptions Disp Refills   furosemide (LASIX) 20 MG tablet [Pharmacy Med Name: FUROSEMIDE 20MG  TABLETS] 45 tablet     Sig: TAKE 1/2 TABLET(10 MG) BY MOUTH DAILY AS NEEDED     Cardiovascular:  Diuretics - Loop Failed - 10/02/2021  9:26 AM      Failed - K in normal range and within 360 days    Potassium  Date Value Ref Range Status  03/03/2021 3.3 (L) 3.5 - 5.1 mmol/L Final  10/31/2014 3.8 3.5 - 5.1 mmol/L Final         Failed - Ca in normal range and within 360 days    Calcium  Date Value Ref Range Status  03/03/2021 8.6 (L) 8.9 - 10.3 mg/dL Final   Calcium, Total  Date Value Ref Range Status  10/31/2014 9.1 8.5 - 10.1 mg/dL Final         Passed - Na in normal range and within 360 days    Sodium  Date Value Ref Range Status  03/03/2021 137 135 - 145 mmol/L Final  11/25/2020 144 134 - 144 mmol/L Final  10/31/2014 138 136 - 145 mmol/L Final         Passed - Cr in normal range and within 360 days    Creatinine  Date Value Ref Range Status  10/31/2014 0.69 0.60 - 1.30 mg/dL Final   Creatinine, Ser  Date Value Ref Range Status  03/03/2021 0.77 0.44 - 1.00 mg/dL Final         Passed - Last BP in normal range    BP Readings from Last 1 Encounters:  10/02/21 122/78         Passed - Valid encounter within last 6 months    Recent Outpatient Visits          Today Annual physical exam   [facility] Glean Hess, MD   10 months ago Prediabetes   [facility] Glean Hess, MD   1 year ago Prediabetes   [facility] Glean Hess, MD   1 year ago Acute non-recurrent maxillary sinusitis   [facility] Glean Hess, MD   1 year ago Primary insomnia   [facility] Glean Hess, MD      Future Appointments            In 1 year [place] Melia Jesse Sans, MD [facility]

## 2021-10-03 LAB — CBC WITH DIFFERENTIAL/PLATELET
Basophils Absolute: 0 10*3/uL (ref 0.0–0.2)
Basos: 1 %
EOS (ABSOLUTE): 0.2 10*3/uL (ref 0.0–0.4)
Eos: 5 %
Hematocrit: 37.9 % (ref 34.0–46.6)
Hemoglobin: 12.2 g/dL (ref 11.1–15.9)
Immature Grans (Abs): 0 10*3/uL (ref 0.0–0.1)
Immature Granulocytes: 0 %
Lymphocytes Absolute: 2.1 10*3/uL (ref 0.7–3.1)
Lymphs: 56 %
MCH: 29 pg (ref 26.6–33.0)
MCHC: 32.2 g/dL (ref 31.5–35.7)
MCV: 90 fL (ref 79–97)
Monocytes Absolute: 0.3 10*3/uL (ref 0.1–0.9)
Monocytes: 7 %
Neutrophils Absolute: 1.2 10*3/uL — ABNORMAL LOW (ref 1.4–7.0)
Neutrophils: 31 %
Platelets: 206 10*3/uL (ref 150–450)
RBC: 4.2 x10E6/uL (ref 3.77–5.28)
RDW: 14.4 % (ref 11.7–15.4)
WBC: 3.8 10*3/uL (ref 3.4–10.8)

## 2021-10-03 LAB — COMPREHENSIVE METABOLIC PANEL
ALT: 13 IU/L (ref 0–32)
AST: 20 IU/L (ref 0–40)
Albumin/Globulin Ratio: 1.3 (ref 1.2–2.2)
Albumin: 3.5 g/dL — ABNORMAL LOW (ref 3.8–4.9)
Alkaline Phosphatase: 101 IU/L (ref 44–121)
BUN/Creatinine Ratio: 8 — ABNORMAL LOW (ref 9–23)
BUN: 6 mg/dL (ref 6–24)
Bilirubin Total: 0.5 mg/dL (ref 0.0–1.2)
CO2: 27 mmol/L (ref 20–29)
Calcium: 9.7 mg/dL (ref 8.7–10.2)
Chloride: 106 mmol/L (ref 96–106)
Creatinine, Ser: 0.71 mg/dL (ref 0.57–1.00)
Globulin, Total: 2.8 g/dL (ref 1.5–4.5)
Glucose: 83 mg/dL (ref 70–99)
Potassium: 3.2 mmol/L — ABNORMAL LOW (ref 3.5–5.2)
Sodium: 145 mmol/L — ABNORMAL HIGH (ref 134–144)
Total Protein: 6.3 g/dL (ref 6.0–8.5)
eGFR: 102 mL/min/{1.73_m2} (ref >59)

## 2021-10-03 LAB — TSH: TSH: 0.786 u[IU]/mL (ref 0.450–4.500)

## 2021-10-03 LAB — VITAMIN B12: Vitamin B-12: 1286 pg/mL — ABNORMAL HIGH (ref 232–1245)

## 2021-10-03 LAB — LIPID PANEL
Chol/HDL Ratio: 1.8 ratio (ref 0.0–4.4)
Cholesterol, Total: 122 mg/dL (ref 100–199)
HDL: 66 mg/dL (ref >39)
LDL Chol Calc (NIH): 43 mg/dL (ref 0–99)
Triglycerides: 57 mg/dL (ref 0–149)
VLDL Cholesterol Cal: 13 mg/dL (ref 5–40)

## 2021-10-03 LAB — HEMOGLOBIN A1C
Est. average glucose Bld gHb Est-mCnc: 111 mg/dL
Hgb A1c MFr Bld: 5.5 % (ref 4.8–5.6)

## 2021-10-16 DIAGNOSIS — G4733 Obstructive sleep apnea (adult) (pediatric): Secondary | ICD-10-CM | POA: Diagnosis not present

## 2021-10-16 DIAGNOSIS — Z79899 Other long term (current) drug therapy: Secondary | ICD-10-CM | POA: Diagnosis not present

## 2021-10-16 DIAGNOSIS — M5416 Radiculopathy, lumbar region: Secondary | ICD-10-CM | POA: Diagnosis not present

## 2021-10-16 DIAGNOSIS — F1120 Opioid dependence, uncomplicated: Secondary | ICD-10-CM | POA: Diagnosis not present

## 2021-10-16 DIAGNOSIS — M961 Postlaminectomy syndrome, not elsewhere classified: Secondary | ICD-10-CM | POA: Diagnosis not present

## 2021-10-16 DIAGNOSIS — F329 Major depressive disorder, single episode, unspecified: Secondary | ICD-10-CM | POA: Diagnosis not present

## 2021-10-16 DIAGNOSIS — F419 Anxiety disorder, unspecified: Secondary | ICD-10-CM | POA: Diagnosis not present

## 2021-10-16 DIAGNOSIS — M461 Sacroiliitis, not elsewhere classified: Secondary | ICD-10-CM | POA: Diagnosis not present

## 2021-10-21 ENCOUNTER — Other Ambulatory Visit: Payer: Self-pay | Admitting: Physical Medicine and Rehabilitation

## 2021-10-22 ENCOUNTER — Encounter: Payer: Self-pay | Admitting: Internal Medicine

## 2021-10-22 ENCOUNTER — Other Ambulatory Visit: Payer: Self-pay | Admitting: Physical Medicine and Rehabilitation

## 2021-10-22 DIAGNOSIS — M961 Postlaminectomy syndrome, not elsewhere classified: Secondary | ICD-10-CM

## 2021-10-23 ENCOUNTER — Ambulatory Visit: Payer: Medicare HMO | Admitting: Internal Medicine

## 2021-10-24 ENCOUNTER — Encounter: Payer: Self-pay | Admitting: Internal Medicine

## 2021-10-24 ENCOUNTER — Other Ambulatory Visit: Payer: Self-pay

## 2021-10-24 ENCOUNTER — Ambulatory Visit (INDEPENDENT_AMBULATORY_CARE_PROVIDER_SITE_OTHER): Payer: BC Managed Care – PPO | Admitting: Internal Medicine

## 2021-10-24 VITALS — BP 116/84 | HR 86 | Ht 67.0 in | Wt 249.0 lb

## 2021-10-24 DIAGNOSIS — R197 Diarrhea, unspecified: Secondary | ICD-10-CM

## 2021-10-24 NOTE — Patient Instructions (Signed)
Start a pro-biotic supplement.  There are many different ones but try to get one with the highest count or the most different strains.  Or get what is on sale.

## 2021-10-24 NOTE — Progress Notes (Signed)
Date:  10/24/2021   Name:  Kimberly Cowan   DOB:  December 29, 1965   MRN:  [id number]   Chief Complaint: Diarrhea (Experiences vertigo 1 day ago and started vomiting  )  Diarrhea  This is a new problem. Episode onset: 3 weeks. The problem occurs more than 10 times per day. The problem has been gradually worsening. The stool consistency is described as Watery. The patient states that diarrhea awakens her from sleep. Associated symptoms include headaches, increased flatus, sweats and vomiting (one episode). Pertinent negatives include no abdominal pain, coughing or fever. Nothing aggravates the symptoms. There are no known risk factors. She has tried anti-motility drug (imodium) for the symptoms. The treatment provided no relief. Her past medical history is significant for a recent abdominal surgery. weight loss surgery 07/2020   Lab Results  Component Value Date   NA 145 (H) 10/02/2021   K 3.2 (L) 10/02/2021   CO2 27 10/02/2021   GLUCOSE 83 10/02/2021   BUN 6 10/02/2021   CREATININE 0.71 10/02/2021   CALCIUM 9.7 10/02/2021   EGFR 102 10/02/2021   GFRNONAA >60 03/03/2021   Lab Results  Component Value Date   CHOL 122 10/02/2021   HDL 66 10/02/2021   LDLCALC 43 10/02/2021   TRIG 57 10/02/2021   CHOLHDL 1.8 10/02/2021   Lab Results  Component Value Date   TSH 0.786 10/02/2021   Lab Results  Component Value Date   HGBA1C 5.5 10/02/2021   Lab Results  Component Value Date   WBC 3.8 10/02/2021   HGB 12.2 10/02/2021   HCT 37.9 10/02/2021   MCV 90 10/02/2021   PLT 206 10/02/2021   Lab Results  Component Value Date   ALT 13 10/02/2021   AST 20 10/02/2021   ALKPHOS 101 10/02/2021   BILITOT 0.5 10/02/2021   No results found for: 25OHVITD2, 25OHVITD3, VD25OH   Review of Systems  Constitutional:  Negative for diaphoresis, fever and unexpected weight change.  HENT:  Negative for trouble swallowing.   Respiratory:  Negative for cough and shortness of breath.    Cardiovascular:  Negative for chest pain.  Gastrointestinal:  Positive for diarrhea, flatus and vomiting (one episode). Negative for abdominal pain.  Neurological:  Positive for headaches. Negative for syncope and weakness.  Psychiatric/Behavioral:  Negative for dysphoric mood and sleep disturbance. The patient is not nervous/anxious.    Patient Active Problem List   Diagnosis Date Noted   Chronic GERD 03/03/2021   Status post hysterectomy 11/05/2020   Bariatric surgery status 01/01/2020   Primary insomnia 12/28/2019   Failed back surgical syndrome 11/27/2019   Mild peripheral edema 11/15/2017   Perforated ear drum, left 06/25/2017   History of pulmonary embolism 06/10/2017   Environmental and seasonal allergies 04/07/2017   Tinea versicolor 02/03/2017   Vasomotor symptoms due to menopause 11/11/2016   Glaucoma 09/28/2016   B12 nutritional deficiency 08/05/2016   OSA on CPAP 03/04/2016   Chronic depression 12/09/2015   Anxiety 12/09/2015   Bilateral lumbar radiculopathy 08/14/2015   Status post gastric bypass for obesity 07/04/2015   Prediabetes 07/01/2015   Spinal stenosis of lumbar region 12/10/2014   Headache, migraine 04/04/2012    Allergies  Allergen Reactions   Morphine And Related Hives, Shortness Of Breath, Nausea And Vomiting and Other (See Comments)    "can't breathe"   Penicillins Anaphylaxis, Hives, Shortness Of Breath and Nausea And Vomiting    "can't breathe", Has patient had a PCN reaction causing immediate rash, facial/tongue/throat  swelling, SOB or lightheadedness with hypotension: Yes Has patient had a PCN reaction causing severe rash involving mucus membranes or skin necrosis: No Has patient had a PCN reaction that required hospitalization No Has patient had a PCN reaction occurring within the last 10 years: No If all of the above answers are "NO", then may proceed with Cephalosporin use.    Latex Hives   Baclofen Nausea Only    Jittery, anxious     Past Surgical History:  Procedure Laterality Date   ABDOMINAL HYSTERECTOMY  10/2006   BACK SURGERY  2014   lumbar fusion   BACK SURGERY  2009   discectomy   BARIATRIC SURGERY  07/2020   Revision   [place]   CHOLECYSTECTOMY  01/2021   COLONOSCOPY WITH PROPOFOL N/A 04/12/2018   Procedure: COLONOSCOPY WITH PROPOFOL;  Surgeon: Lin Landsman, MD;  Location: [place];  Service: Gastroenterology;  Laterality: N/A;   GASTRIC BYPASS  2005   KNEE ARTHROSCOPY WITH LATERAL MENISECTOMY Left 05/27/2017   Procedure: KNEE ARTHROSCOPY WITH LATERAL MENISECTOMY;  Surgeon: Hessie Knows, MD;  Location: [facility] ORS;  Service: Orthopedics;  Laterality: Left;   KNEE ARTHROSCOPY WITH LATERAL RELEASE Left 05/27/2017   Procedure: KNEE ARTHROSCOPY WITH LATERAL RELEASE;  Surgeon: Hessie Knows, MD;  Location: [facility] ORS;  Service: Orthopedics;  Laterality: Left;   LAPAROSCOPIC SALPINGO OOPHERECTOMY Bilateral 10/05/2016   Procedure: LAPAROSCOPIC SALPINGO OOPHORECTOMY;  Surgeon: Brayton Mars, MD;  Location: [facility] ORS;  Service: Gynecology;  Laterality: Bilateral;   REDUCTION MAMMAPLASTY Bilateral    2001   SPINAL CORD STIMULATOR INSERTION N/A 03/20/2016   Procedure: LUMBAR SPINAL CORD STIMULATOR INSERTION;  Surgeon: Eustace Moore, MD;  Location: [place] [facility];  Service: Neurosurgery;  Laterality: N/A;   SPINE SURGERY  2009 2014 2017   TONSILLECTOMY      Social History   Tobacco Use   Smoking status: Never   Smokeless tobacco: Never   Tobacco comments:    smoking cessation materials not required  Vaping Use   Vaping Use: Never used  Substance Use Topics   Alcohol use: No   Drug use: No     Medication list has been reviewed and updated.  Current Meds  Medication Sig   BELSOMRA 20 MG TABS TAKE 1 TABLET BY MOUTH AT BEDTIME   cyanocobalamin (,VITAMIN B-12,) 1000 MCG/ML injection ADMINISTER 1 ML(1000 MCG) IN THE MUSCLE EVERY 30 DAYS   cyclobenzaprine (FLEXERIL) 10  MG tablet Take 10 mg by mouth daily as needed for muscle spasms.   fluticasone (FLONASE) 50 MCG/ACT nasal spray NEW PRESCRIPTION REQUEST: INSTILL ONE SPRAY IN EACH NOSTRIL EVERY DAY AS NEEDED   furosemide (LASIX) 20 MG tablet Take 0.5 tablets (10 mg total) by mouth daily as needed.   gabapentin (NEURONTIN) 300 MG capsule Take 2 capsules by mouth at bedtime.   HYDROcodone-acetaminophen (NORCO/VICODIN) 5-325 MG tablet Take 1 tablet by mouth every 6 (six) hours as needed for moderate pain.   ketoconazole (NIZORAL) 2 % shampoo Apply 1 application topically daily as needed (for skin fungus on neck/back).   latanoprost (XALATAN) 0.005 % ophthalmic solution Place 1 drop into both eyes at bedtime.    levocetirizine (XYZAL) 5 MG tablet Take 1 tablet (5 mg total) by mouth every evening.   pantoprazole (PROTONIX) 40 MG tablet TAKE 1 TABLET BY MOUTH ONCE DAILY   rizatriptan (MAXALT-MLT) 10 MG disintegrating tablet DISSOLVE 1 TABLET BY MOUTH AS NEEDED FOR MIGRAINE- MAY REPEAT IN 2 HOURS AS  NEEDED (Patient taking differently: Take 10 mg by mouth daily as needed for migraine. (repeat after 2 hours if needed))   Current Facility-Administered Medications for the 10/24/21 encounter (Office Visit) with Glean Hess, MD  Medication   cyanocobalamin ((VITAMIN B-12)) injection 1,000 mcg    PHQ 2/9 Scores 10/24/2021 10/02/2021 07/30/2021 11/25/2020  PHQ - 2 Score 0 0 0 0  PHQ- 9 Score _0 0  Exception Documentation - - - -    GAD 7 : Generalized Anxiety Score 10/24/2021 10/02/2021 11/25/2020 07/24/2020  Nervous, Anxious, on Edge 1 1 0 0  Control/stop worrying 0 1 0 0  Worry too much - different things 1 1 0 0  Trouble relaxing 3 1 0 0  Restless 0 1 0 0  Easily annoyed or irritable 1 1 0 0  Afraid - awful might happen 1 1 0 0  Total GAD 7 Score 7 7 0 0  Anxiety Difficulty - Not difficult at all - Not difficult at all    BP Readings from Last 3 Encounters:  10/24/21 116/84  10/02/21 122/78  07/30/21 122/78     Physical Exam Vitals and nursing note reviewed.  Constitutional:      General: She is not in acute distress.    Appearance: She is well-developed. She is obese.  HENT:     Head: Normocephalic and atraumatic.  Cardiovascular:     Rate and Rhythm: Normal rate and regular rhythm.  Pulmonary:     Effort: Pulmonary effort is normal. No respiratory distress.     Breath sounds: No wheezing or rhonchi.  Abdominal:     General: Bowel sounds are decreased.     Palpations: Abdomen is soft.     Tenderness: There is abdominal tenderness in the left lower quadrant. There is no guarding or rebound.  Musculoskeletal:     Cervical back: Normal range of motion.  Skin:    General: Skin is warm and dry.     Findings: No rash.  Neurological:     Mental Status: She is alert and oriented to person, place, and time.  Psychiatric:        Mood and Affect: Mood normal.        Behavior: Behavior normal.    Wt Readings from Last 3 Encounters:  10/24/21 249 lb (112.9 kg)  10/02/21 250 lb (113.4 kg)  07/30/21 264 lb 9.6 oz (120 kg)    BP 116/84 (BP Location: Right Wrist)    Pulse 86    Ht _1  (1.702 m)    Wt 249 lb (112.9 kg)    SpO2 94%    BMI 39.00 kg/m   Assessment and Plan: 1. Diarrhea of presumed infectious origin She appears well hydrated; no obvious cause for symptoms noted. Abdominal exam is benign. Will start Probiotics and get GI panel. - GI Profile, Stool, PCR   Partially dictated using Editor, commissioning. Any errors are unintentional.  Halina Maidens, MD [facility]  10/24/2021

## 2021-10-28 ENCOUNTER — Other Ambulatory Visit: Payer: Self-pay

## 2021-10-28 ENCOUNTER — Ambulatory Visit
Admission: RE | Admit: 2021-10-28 | Discharge: 2021-10-28 | Disposition: A | Discharge status: Home / Self Care | Payer: BC Managed Care – PPO | Source: Ambulatory Visit | Attending: Physical Medicine and Rehabilitation | Admitting: Physical Medicine and Rehabilitation

## 2021-10-28 ENCOUNTER — Encounter: Payer: Self-pay | Admitting: Internal Medicine

## 2021-10-28 DIAGNOSIS — M961 Postlaminectomy syndrome, not elsewhere classified: Secondary | ICD-10-CM

## 2021-10-28 DIAGNOSIS — M5126 Other intervertebral disc displacement, lumbar region: Secondary | ICD-10-CM | POA: Diagnosis not present

## 2021-10-28 DIAGNOSIS — M4326 Fusion of spine, lumbar region: Secondary | ICD-10-CM | POA: Diagnosis not present

## 2021-10-28 DIAGNOSIS — M5416 Radiculopathy, lumbar region: Secondary | ICD-10-CM | POA: Diagnosis not present

## 2021-10-28 MED ORDER — MEPERIDINE HCL 50 MG/ML IJ SOLN
50.0000 mg | Freq: Once | INTRAMUSCULAR | Status: AC | PRN
  Administered 2021-10-28: 75 mg via INTRAMUSCULAR

## 2021-10-28 MED ORDER — IOPAMIDOL (ISOVUE-M 200) INJECTION 41%
18.0000 mL | Freq: Once | INTRAMUSCULAR | Status: AC
  Administered 2021-10-28: 18 mL via INTRATHECAL

## 2021-10-28 MED ORDER — DIAZEPAM 5 MG PO TABS: 10.0000 mg | ORAL_TABLET | Freq: Once | ORAL | Status: DC

## 2021-10-28 MED ORDER — ONDANSETRON HCL 4 MG/2ML IJ SOLN
4.0000 mg | Freq: Once | INTRAMUSCULAR | Status: AC | PRN
  Administered 2021-10-28: 4 mg via INTRAMUSCULAR

## 2021-10-28 NOTE — Discharge Instructions (Signed)

## 2021-10-28 NOTE — Discharge Instr - Other Orders (Signed)
1338: pt reports pain 10/10 post myelogram procedure, see MAR.

## 2021-10-28 NOTE — Progress Notes (Signed)
Pt has spinal cord stimulator and reports she has turned it off for her CT myelogram procedure.  

## 2021-10-30 DIAGNOSIS — M5416 Radiculopathy, lumbar region: Secondary | ICD-10-CM | POA: Diagnosis not present

## 2021-10-30 DIAGNOSIS — M961 Postlaminectomy syndrome, not elsewhere classified: Secondary | ICD-10-CM | POA: Diagnosis not present

## 2021-11-01 LAB — GI PROFILE, STOOL, PCR

## 2021-11-03 ENCOUNTER — Other Ambulatory Visit: Payer: Self-pay | Admitting: Internal Medicine

## 2021-11-03 DIAGNOSIS — F5101 Primary insomnia: Secondary | ICD-10-CM

## 2021-11-04 NOTE — Telephone Encounter (Signed)
Requested medication (s) are due for refill today: Yes  Requested medication (s) are on the active medication list: Yes  Last refill:  09/03/21  Future visit scheduled: Yes  Notes to clinic:  See request.    Requested Prescriptions  Pending Prescriptions Disp Refills   BELSOMRA 20 MG TABS [Pharmacy Med Name: Belsomra 20 mg tablet] 30 tablet 0    Sig: TAKE ONE TABLET BY MOUTH AT BEDTIME     Off-Protocol Failed - 11/03/2021  5:02 PM      Failed - Medication not assigned to a protocol, review manually.      Passed - Valid encounter within last 12 months    Recent Outpatient Visits           1 week ago Diarrhea of presumed infectious origin   [facility] Glean Hess, [facility]   1 month ago Annual physical exam   [facility] Glean Hess, MD   11 months ago Prediabetes   [facility] Glean Hess, MD   1 year ago Prediabetes   [facility] Glean Hess, MD   1 year ago Acute non-recurrent maxillary sinusitis   [facility] Glean Hess, MD       Future Appointments             In 11 months Army Melia Jesse Sans, MD [facility]            Signed Prescriptions Disp Refills   pantoprazole (PROTONIX) 40 MG tablet 90 tablet 1    Sig: TAKE ONE TABLET BY MOUTH DAILY     Gastroenterology: Proton Pump Inhibitors Passed - 11/03/2021  5:02 PM      Passed - Valid encounter within last 12 months    Recent Outpatient Visits           1 week ago Diarrhea of presumed infectious origin   [facility] Glean Hess, MD   1 month ago Annual physical exam   [facility] Glean Hess, MD   11 months ago Prediabetes   [facility] Glean Hess, MD   1 year ago Prediabetes   [facility] Glean Hess, MD   1 year ago Acute non-recurrent maxillary sinusitis   [facility] Glean Hess, MD       Future Appointments             In 26  months Army Melia Jesse Sans, MD [facility], [facility]

## 2021-11-07 DIAGNOSIS — M5416 Radiculopathy, lumbar region: Secondary | ICD-10-CM | POA: Diagnosis not present

## 2021-11-20 DIAGNOSIS — H40153 Residual stage of open-angle glaucoma, bilateral: Secondary | ICD-10-CM | POA: Diagnosis not present

## 2021-11-20 DIAGNOSIS — M48061 Spinal stenosis, lumbar region without neurogenic claudication: Secondary | ICD-10-CM | POA: Diagnosis not present

## 2021-11-27 ENCOUNTER — Other Ambulatory Visit: Payer: Self-pay | Admitting: Internal Medicine

## 2021-11-27 DIAGNOSIS — R600 Localized edema: Secondary | ICD-10-CM

## 2021-11-27 NOTE — Telephone Encounter (Signed)
Receipt confirmed by pharmacy 11/27/21  At 3:46 ?Requested Prescriptions  ?Pending Prescriptions Disp Refills  ?? furosemide (LASIX) 20 MG tablet [Pharmacy Med Name: FUROSEMIDE 20MG  TABLETS] 45 tablet   ?  Sig: TAKE 1/2 TABLET(10 MG) BY MOUTH DAILY AS NEEDED  ?  ? Cardiovascular:  Diuretics - Loop Failed - 11/27/2021  4:07 PM  ?  ?  Failed - K in normal range and within 180 days  ?  Potassium  ?Date Value Ref Range Status  ?10/02/2021 3.2 (L) 3.5 - 5.2 mmol/L Final  ?10/31/2014 3.8 3.5 - 5.1 mmol/L Final  ?   ?  ?  Failed - Na in normal range and within 180 days  ?  Sodium  ?Date Value Ref Range Status  ?10/02/2021 145 (H) 134 - 144 mmol/L Final  ?10/31/2014 138 136 - 145 mmol/L Final  ?   ?  ?  Failed - Mg Level in normal range and within 180 days  ?  Magnesium  ?Date Value Ref Range Status  ?03/03/2021 1.9 1.7 - 2.4 mg/dL Final  ?  Comment:  ?  Performed at [facility], [place] [place], [place] [address]  ?   ?  ?  Passed - Ca in normal range and within 180 days  ?  Calcium  ?Date Value Ref Range Status  ?10/02/2021 9.7 8.7 - 10.2 mg/dL Final  ? ?Calcium, Total  ?Date Value Ref Range Status  ?10/31/2014 9.1 8.5 - 10.1 mg/dL Final  ?   ?  ?  Passed - Cr in normal range and within 180 days  ?  Creatinine  ?Date Value Ref Range Status  ?10/31/2014 0.69 0.60 - 1.30 mg/dL Final  ? ?Creatinine, Ser  ?Date Value Ref Range Status  ?10/02/2021 0.71 0.57 - 1.00 mg/dL Final  ?   ?  ?  Passed - Cl in normal range and within 180 days  ?  Chloride  ?Date Value Ref Range Status  ?10/02/2021 106 96 - 106 mmol/L Final  ?10/31/2014 104 98 - 107 mmol/L Final  ?   ?  ?  Passed - Last BP in normal range  ?  BP Readings from Last 1 Encounters:  ?10/28/21 109/72  ?   ?  ?  Passed - Valid encounter within last 6 months  ?  Recent Outpatient Visits   ?      ? 1 month ago Diarrhea of presumed infectious origin  ? [facility] Glean Hess, MD  ? 1 month ago Annual physical exam  ? [facility]  Glean Hess, MD  ? 1 year ago Prediabetes  ? [facility] Glean Hess, MD  ? 1 year ago Prediabetes  ? [facility] Glean Hess, MD  ? 1 year ago Acute non-recurrent maxillary sinusitis  ? [facility] Glean Hess, MD  ?  ?  ?Future Appointments   ?        ? In 10 months Glean Hess, MD [facility], [place]  ?  ? ?  ?  ?  ? ?

## 2021-11-27 NOTE — Telephone Encounter (Signed)
Requested medication (s) are due for refill today: yes ? ?Requested medication (s) are on the active medication list: yes ? ?Last refill:  10/02/21 #30/0 ? ?Future visit scheduled: yes ? ?Notes to clinic:  Unable to refill per protocol due to failed labs, no updated results. ? ? ? ?  ?Requested Prescriptions  ?Pending Prescriptions Disp Refills  ? furosemide (LASIX) 20 MG tablet [Pharmacy Med Name: FUROSEMIDE 20MG  TABLETS] 30 tablet 0  ?  Sig: TAKE 1/2 TABLET(10 MG) BY MOUTH DAILY AS NEEDED  ?  ? Cardiovascular:  Diuretics - Loop Failed - 11/27/2021 11:34 AM  ?  ?  Failed - K in normal range and within 180 days  ?  Potassium  ?Date Value Ref Range Status  ?10/02/2021 3.2 (L) 3.5 - 5.2 mmol/L Final  ?10/31/2014 3.8 3.5 - 5.1 mmol/L Final  ?  ?  ?  ?  Failed - Na in normal range and within 180 days  ?  Sodium  ?Date Value Ref Range Status  ?10/02/2021 145 (H) 134 - 144 mmol/L Final  ?10/31/2014 138 136 - 145 mmol/L Final  ?  ?  ?  ?  Failed - Mg Level in normal range and within 180 days  ?  Magnesium  ?Date Value Ref Range Status  ?03/03/2021 1.9 1.7 - 2.4 mg/dL Final  ?  Comment:  ?  Performed at [facility], [address] [place], [place] [address]  ?  ?  ?  ?  Passed - Ca in normal range and within 180 days  ?  Calcium  ?Date Value Ref Range Status  ?10/02/2021 9.7 8.7 - 10.2 mg/dL Final  ? ?Calcium, Total  ?Date Value Ref Range Status  ?10/31/2014 9.1 8.5 - 10.1 mg/dL Final  ?  ?  ?  ?  Passed - Cr in normal range and within 180 days  ?  Creatinine  ?Date Value Ref Range Status  ?10/31/2014 0.69 0.60 - 1.30 mg/dL Final  ? ?Creatinine, Ser  ?Date Value Ref Range Status  ?10/02/2021 0.71 0.57 - 1.00 mg/dL Final  ?  ?  ?  ?  Passed - Cl in normal range and within 180 days  ?  Chloride  ?Date Value Ref Range Status  ?10/02/2021 106 96 - 106 mmol/L Final  ?10/31/2014 104 98 - 107 mmol/L Final  ?  ?  ?  ?  Passed - Last BP in normal range  ?  BP Readings from Last 1 Encounters:  ?10/28/21 109/72  ?  ?  ?  ?   Passed - Valid encounter within last 6 months  ?  Recent Outpatient Visits   ? ?      ? 1 month ago Diarrhea of presumed infectious origin  ? [facility] Glean Hess, MD  ? 1 month ago Annual physical exam  ? [facility] Glean Hess, MD  ? 1 year ago Prediabetes  ? [facility] Glean Hess, MD  ? 1 year ago Prediabetes  ? [facility] Glean Hess, MD  ? 1 year ago Acute non-recurrent maxillary sinusitis  ? [facility] Glean Hess, MD  ? ?  ?  ?Future Appointments   ? ?        ? In 10 months Glean Hess, MD [facility], [place]  ? ?  ? ?  ?  ?  ? ?

## 2021-11-28 ENCOUNTER — Encounter: Payer: Self-pay | Admitting: Internal Medicine

## 2021-11-28 ENCOUNTER — Other Ambulatory Visit: Payer: Self-pay | Admitting: Internal Medicine

## 2021-11-28 ENCOUNTER — Other Ambulatory Visit: Payer: Self-pay

## 2021-11-28 DIAGNOSIS — R600 Localized edema: Secondary | ICD-10-CM

## 2021-11-28 MED ORDER — FUROSEMIDE 20 MG PO TABS: 20.0000 mg | ORAL_TABLET | Freq: Every day | ORAL | 0 refills | Status: DC

## 2021-12-01 ENCOUNTER — Other Ambulatory Visit: Payer: Self-pay | Admitting: Internal Medicine

## 2021-12-01 DIAGNOSIS — R600 Localized edema: Secondary | ICD-10-CM

## 2021-12-02 ENCOUNTER — Other Ambulatory Visit: Payer: Self-pay | Admitting: Neurosurgery

## 2021-12-02 NOTE — Telephone Encounter (Signed)
Called pt to verify she had gotten medication.  ?Requested Prescriptions  ?Pending Prescriptions Disp Refills  ?? furosemide (LASIX) 20 MG tablet [Pharmacy Med Name: FUROSEMIDE '20MG'$  TABLETS] 90 tablet   ?  Sig: TAKE 1 TABLET(20 MG) BY MOUTH DAILY  ?  ? Cardiovascular:  Diuretics - Loop Failed - 12/02/2021 10:32 AM  ?  ?  Failed - K in normal range and within 180 days  ?  Potassium  ?Date Value Ref Range Status  ?10/02/2021 3.2 (L) 3.5 - 5.2 mmol/L Final  ?10/31/2014 3.8 3.5 - 5.1 mmol/L Final  ?   ?  ?  Failed - Na in normal range and within 180 days  ?  Sodium  ?Date Value Ref Range Status  ?10/02/2021 145 (H) 134 - 144 mmol/L Final  ?10/31/2014 138 136 - 145 mmol/L Final  ?   ?  ?  Failed - Mg Level in normal range and within 180 days  ?  Magnesium  ?Date Value Ref Range Status  ?03/03/2021 1.9 1.7 - 2.4 mg/dL Final  ?  Comment:  ?  Performed at [facility], [address] [place], [place] [address]  ?   ?  ?  Passed - Ca in normal range and within 180 days  ?  Calcium  ?Date Value Ref Range Status  ?10/02/2021 9.7 8.7 - 10.2 mg/dL Final  ? ?Calcium, Total  ?Date Value Ref Range Status  ?10/31/2014 9.1 8.5 - 10.1 mg/dL Final  ?   ?  ?  Passed - Cr in normal range and within 180 days  ?  Creatinine  ?Date Value Ref Range Status  ?10/31/2014 0.69 0.60 - 1.30 mg/dL Final  ? ?Creatinine, Ser  ?Date Value Ref Range Status  ?10/02/2021 0.71 0.57 - 1.00 mg/dL Final  ?   ?  ?  Passed - Cl in normal range and within 180 days  ?  Chloride  ?Date Value Ref Range Status  ?10/02/2021 106 96 - 106 mmol/L Final  ?10/31/2014 104 98 - 107 mmol/L Final  ?   ?  ?  Passed - Last BP in normal range  ?  BP Readings from Last 1 Encounters:  ?10/28/21 109/72  ?   ?  ?  Passed - Valid encounter within last 6 months  ?  Recent Outpatient Visits   ?      ? 1 month ago Diarrhea of presumed infectious origin  ? [facility] Glean Hess, MD  ? 2 months ago Annual physical exam  ? [facility] Glean Hess, MD  ? 1 year ago Prediabetes  ? [facility] Glean Hess, MD  ? 1 year ago Prediabetes  ? [facility] Glean Hess, MD  ? 1 year ago Acute non-recurrent maxillary sinusitis  ? [facility] Glean Hess, MD  ?  ?  ?Future Appointments   ?        ? In 10 months Glean Hess, MD [facility], [place]  ?  ? ?  ?  ?  ? ?

## 2021-12-02 NOTE — Telephone Encounter (Signed)
Pt called to verify that she has already gotten medication refilled. Pt states that she has. ?

## 2021-12-05 ENCOUNTER — Other Ambulatory Visit: Payer: Self-pay | Admitting: Internal Medicine

## 2021-12-05 DIAGNOSIS — F5101 Primary insomnia: Secondary | ICD-10-CM

## 2021-12-05 NOTE — Telephone Encounter (Signed)
Requested medication (s) are due for refill today: yes ? ?Requested medication (s) are on the active medication list: yes ? ?Last refill:  09/03/21 #30/2 ? ?Future visit scheduled: yes ? ?Notes to clinic:  Unable to refill per protocol, medication not assigned to the refill protocol. ? ? ? ?  ?Requested Prescriptions  ?Pending Prescriptions Disp Refills  ? BELSOMRA 20 MG TABS [Pharmacy Med Name: Belsomra 20 mg tablet] 30 tablet 2  ?  Sig: TAKE ONE TABLET BY MOUTH AT BEDTIME  ?  ? Off-Protocol Failed - 12/05/2021  4:05 PM  ?  ?  Failed - Medication not assigned to a protocol, review manually.  ?  ?  Passed - Valid encounter within last 12 months  ?  Recent Outpatient Visits   ? ?      ? 1 month ago Diarrhea of presumed infectious origin  ? [facility] Glean Hess, MD  ? 2 months ago Annual physical exam  ? [facility] Glean Hess, MD  ? 1 year ago Prediabetes  ? [facility] Glean Hess, MD  ? 1 year ago Prediabetes  ? [facility] Glean Hess, MD  ? 1 year ago Acute non-recurrent maxillary sinusitis  ? [facility] Glean Hess, MD  ? ?  ?  ?Future Appointments   ? ?        ? In 10 months Glean Hess, MD [facility], [place]  ? ?  ? ?  ?  ?  ? ?

## 2021-12-15 NOTE — Progress Notes (Signed)
Surgical Instructions ? ? ? Your procedure is scheduled on December 19, 2021. ? Report to [facility] Main Entrance "A" at 6:00 A.M., then check in with the Admitting office. ? Call this number if you have problems the morning of surgery: ? [phone] ? ? If you have any questions prior to your surgery date call [phone]: Open Monday-Friday 8am-4pm ? ? Remember: ? Do not eat or drink after midnight the night before your surgery ?  ? Take these medicines the morning of surgery with A SIP OF WATER:  ? ?Pantoprazole (Protonix) ? ?If needed: ?Cyclobenzaprine (Flexeril) ?Fluticasone (Flonase) ?Hydrocodone-Acetaminophen (Temelec) ?Rizatriptan (Maxalt-MLT) ?Scopolamine (Transderm-SCOP) ? ?PLEASE BRING YOUR SPINAL CORD STIMULATOR REMOTE WITH YOU THE DAY OF SURGERY. ? ?As of today, STOP taking any Aspirin (unless otherwise instructed by your surgeon) Aleve, Naproxen, Ibuprofen, Motrin, Advil, Goody's, BC's, all herbal medications, fish oil, and all vitamins. ? ?         ?Do not wear jewelry or makeup ?Do not wear lotions, powders, perfumes, or deodorant. ?Do not shave 48 hours prior to surgery. ?Do not bring valuables to the hospital. ?Do not wear nail polish, gel polish, artificial nails, or any other type of covering on natural nails (fingers and toes) ?If you have artificial nails or gel coating that need to be removed by a nail salon, please have this removed prior to surgery. Artificial nails or gel coating may interfere with anesthesia's ability to adequately monitor your vital signs. ? ?[facility] is not responsible for any belongings or valuables. .  ? ?Do NOT Smoke (Tobacco/Vaping)  24 hours prior to your procedure ? ?If you use a CPAP at night, you may bring your mask for your overnight stay. ?  ?Contacts, glasses, hearing aids, dentures or partials may not be worn into surgery, please bring cases for these belongings ?  ?For patients admitted to the hospital, discharge time will be determined by your treatment  team. ?  ?Patients discharged the day of surgery will not be allowed to drive home, and someone needs to stay with them for 24 hours. ? ?NO VISITORS WILL BE ALLOWED IN PRE-OP WHERE PATIENTS ARE PREPPED FOR SURGERY.  ONLY 1 SUPPORT PERSON MAY BE PRESENT IN THE WAITING ROOM WHILE YOU ARE IN SURGERY.  IF YOU ARE TO BE ADMITTED, ONCE YOU ARE IN YOUR ROOM YOU WILL BE ALLOWED TWO (2) VISITORS. 1 (ONE) VISITOR MAY STAY OVERNIGHT BUT MUST ARRIVE TO THE ROOM BY 8pm.  Minor children may have two parents present. Special consideration for safety and communication needs will be reviewed on a case by case basis. ? ?Special instructions:   ? ?Oral Hygiene is also important to reduce your risk of infection.  Remember - BRUSH YOUR TEETH THE MORNING OF SURGERY WITH YOUR REGULAR TOOTHPASTE ? ? ?[facility] [place]- Preparing For Surgery ? ?Before surgery, you can play an important role. Because skin is not sterile, your skin needs to be as free of germs as possible. You can reduce the number of germs on your skin by washing with CHG (chlorahexidine gluconate) Soap before surgery.  CHG is an antiseptic cleaner which kills germs and bonds with the skin to continue killing germs even after washing.   ? ? ?Please do not use if you have an allergy to CHG or antibacterial soaps. If your skin becomes reddened/irritated stop using the CHG.  ?Do not shave (including legs and underarms) for at least 48 hours prior to first CHG shower. It is OK to  shave your face. ? ?Please follow these instructions carefully. ?  ? ? Shower the NIGHT BEFORE SURGERY and the MORNING OF SURGERY with CHG Soap.  ? If you chose to wash your hair, wash your hair first as usual with your normal shampoo. After you shampoo, rinse your hair and body thoroughly to remove the shampoo.  Then [employer] and genitals (private parts) with your normal soap and rinse thoroughly to remove soap. ? ?After that Use CHG Soap as you would any other liquid soap. You can apply CHG directly to the  skin and wash gently with a scrungie or a clean washcloth.  ? ?Apply the CHG Soap to your body ONLY FROM THE NECK DOWN.  Do not use on open wounds or open sores. Avoid contact with your eyes, ears, mouth and genitals (private parts). Wash Face and genitals (private parts)  with your normal soap.  ? ?Wash thoroughly, paying special attention to the area where your surgery will be performed. ? ?Thoroughly rinse your body with warm water from the neck down. ? ?DO NOT shower/wash with your normal soap after using and rinsing off the CHG Soap. ? ?Pat yourself dry with a CLEAN TOWEL. ? ?Wear CLEAN PAJAMAS to bed the night before surgery ? ?Place CLEAN SHEETS on your bed the night before your surgery ? ?DO NOT SLEEP WITH PETS. ? ? ?Day of Surgery: ? ?Take a shower with CHG soap. ?Wear Clean/Comfortable clothing the morning of surgery ?Do not apply any deodorants/lotions.   ?Remember to brush your teeth WITH YOUR REGULAR TOOTHPASTE. ? ? ? ?COVID testing OR BEFORE SURGERY: ? ?If you are going to stay overnight or be admitted after your procedure/surgery and require a pre-op COVID test, please follow these instructions after your COVID test  ? ?You are not required to quarantine however you are required to wear a well-fitting mask when you are out and around people not in your household.  If your mask becomes wet or soiled, replace with a new one. ? ?Wash your hands often with soap and water for 20 seconds or clean your hands with an alcohol-based hand sanitizer that contains at least 60% alcohol. ? ?Do not share personal items. ? ?Notify your provider: ?if you are in close contact with someone who has COVID  ?or if you develop a fever of 100.4 or greater, sneezing, cough, sore throat, shortness of breath or body aches. ? ?  ?Please read over the following fact sheets that you were given.  ? ?

## 2021-12-16 ENCOUNTER — Encounter (HOSPITAL_COMMUNITY)
Admission: RE | Admit: 2021-12-16 | Discharge: 2021-12-16 | Disposition: A | Discharge status: Home / Self Care | Payer: BC Managed Care – PPO | Source: Ambulatory Visit | Attending: Neurosurgery | Admitting: Neurosurgery

## 2021-12-16 ENCOUNTER — Encounter (HOSPITAL_COMMUNITY): Payer: Self-pay

## 2021-12-16 ENCOUNTER — Other Ambulatory Visit: Payer: Self-pay

## 2021-12-16 VITALS — BP 132/86 | HR 81 | Temp 98.0°F | Resp 17 | Ht 67.0 in | Wt 244.6 lb

## 2021-12-16 DIAGNOSIS — Z01812 Encounter for preprocedural laboratory examination: Secondary | ICD-10-CM | POA: Diagnosis not present

## 2021-12-16 DIAGNOSIS — Z86718 Personal history of other venous thrombosis and embolism: Secondary | ICD-10-CM | POA: Insufficient documentation

## 2021-12-16 DIAGNOSIS — Z01818 Encounter for other preprocedural examination: Secondary | ICD-10-CM

## 2021-12-16 DIAGNOSIS — Z86711 Personal history of pulmonary embolism: Secondary | ICD-10-CM | POA: Diagnosis not present

## 2021-12-16 LAB — BASIC METABOLIC PANEL
Anion gap: 8 (ref 5–15)
BUN: 5 mg/dL — ABNORMAL LOW (ref 6–20)
CO2: 28 mmol/L (ref 22–32)
Calcium: 9.6 mg/dL (ref 8.9–10.3)
Chloride: 107 mmol/L (ref 98–111)
Creatinine, Ser: 0.62 mg/dL (ref 0.44–1.00)
GFR, Estimated: >60 mL/min (ref >60)
Glucose, Bld: 90 mg/dL (ref 70–99)
Potassium: 3.2 mmol/L — ABNORMAL LOW (ref 3.5–5.1)
Sodium: 143 mmol/L (ref 135–145)

## 2021-12-16 LAB — SURGICAL PCR SCREEN
MRSA, PCR: NEGATIVE
Staphylococcus aureus: NEGATIVE

## 2021-12-16 LAB — CBC
HCT: 41.6 % (ref 36.0–46.0)
Hemoglobin: 13.2 g/dL (ref 12.0–15.0)
MCH: 29.3 pg (ref 26.0–34.0)
MCHC: 31.7 g/dL (ref 30.0–36.0)
MCV: 92.2 fL (ref 80.0–100.0)
Platelets: 197 10*3/uL (ref 150–400)
RBC: 4.51 MIL/uL (ref 3.87–5.11)
RDW: 14.7 % (ref 11.5–15.5)
WBC: 3.9 10*3/uL — ABNORMAL LOW (ref 4.0–10.5)
nRBC: 0 % (ref 0.0–0.2)

## 2021-12-16 LAB — TYPE AND SCREEN
ABO/RH(D): B POS
Antibody Screen: NEGATIVE

## 2021-12-16 NOTE — Progress Notes (Signed)
PCP: Worthy Rancher, MD ?Cardiologist: denies ? ?EKG: 03/03/21 ?[id number] ?ECHO:  06/10/17 ?Stress Test:  denies ?Cardiac Cath: denies ? ?Fasting Blood Sugar-  na ?Checks Blood Sugar_na__ times a day ? ?OSA/CPAP: No ? ?ASA/Blood Thinner: No ? ?Covid test not needed ? ?Anesthesia Review: Yes, EKG ST on 03/03/21.  She had an EKG 06/09/17 that was ST and an Echo the day after on 06/10/17 ? ?Patient denies shortness of breath, fever, cough, and chest pain at PAT appointment. ? ?Patient verbalized understanding of instructions provided today at the PAT appointment.  Patient asked to review instructions at home and day of surgery.   ?

## 2021-12-17 ENCOUNTER — Encounter (HOSPITAL_COMMUNITY): Payer: Self-pay

## 2021-12-17 NOTE — Progress Notes (Signed)
Anesthesia Chart Review: ? 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  She completed anticoagulation therapy.  She had cardiology evaluation October 2021 at [facility] prior to undergoing revision bariatric surgery.  Echo June 2021 showed EF 65%, normal LV diastolic function, normal valves.  Cardiolite stress test June 2021 showed no evidence of ischemia or infarction.  Patient was cleared to undergo laparoscopic revision of gastric bypass surgery which she subsequently had November 9311 without complication. ? ?Prior diagnosis of OSA, pt currently denies use of CPAP. ? ?Preop labs reviewed, unremarkable ? ?EKG 03/03/21 (in setting of sepsis): Sinus tachycardia. Rate 118. Low voltage, precordial leads. Nonspecific T abnrm, anterolateral leads ? ?Cardiolite 03/03/2020 (Care Everywhere): ?Normal post stress ejection fraction of 72%.  ?Rest/stress SPECT tomographic myocardial perfusion scan is normal with no evidence of ischemia or infarction.  ?Plan: Patient with low to moderate risk for surgery.  Continue good control of risk factors and regular exercise.  ? ?Echo 02/29/2020 (Care Everywhere): ?Summary:  ?1. The left ventricle mass index and the relative wall thickness values  ?indicate moderate concentric hypertrophy.    ?2. The EF is estimated at greater than 65%. Normal left ventricular  ?diastolic filling is observed.  ?3. There is mild sclerosis of the aortic valve cusps.  ?4. There is trivial tricuspid regurgitation. No pulmonary hypertension  ?is noted.  ?5. There is no pericardial effusion. No evidence of thrombus.  ? ? ? ?Karoline Caldwell, PA-C ?[facility]/Anesthesiology ?Phone [phone] ?12/17/2021 10:43 AM ? ?

## 2021-12-17 NOTE — Anesthesia Preprocedure Evaluation (Addendum)
Anesthesia Evaluation  ?Patient identified by MRN, date of birth, ID band ?Patient awake ? ? ? ?Reviewed: ?Allergy & Precautions, NPO status , Patient's Chart, lab work & pertinent test results ? ?Airway ?Mallampati: II ? ?TM Distance: >3 FB ?Neck ROM: Full ? ? ? Dental ? ?(+) Missing, Dental Advisory Given ?  ?Pulmonary ?sleep apnea and Continuous Positive Airway Pressure Ventilation ,  ?  ?Pulmonary exam normal ?breath sounds clear to auscultation ? ? ? ? ? ? Cardiovascular ?hypertension, Pt. on medications ?Normal cardiovascular exam ?Rhythm:Regular Rate:Normal ? ? ?  ?Neuro/Psych ?negative neurological ROS ? negative psych ROS  ? GI/Hepatic ?Neg liver ROS, GERD  Medicated,  ?Endo/Other  ?Morbid obesity ? Renal/GU ?negative Renal ROS  ?negative genitourinary ?  ?Musculoskeletal ?negative musculoskeletal ROS ?(+)  ? Abdominal ?  ?Peds ?negative pediatric ROS ?(+)  Hematology ?negative hematology ROS ?(+)   ?Anesthesia Other Findings ? ? Reproductive/Obstetrics ?negative OB ROS ? ?  ? ? ? ? ? ? ? ? ? ? ? ? ? ?  ?  ? ? ? ? ? ? ? ?Anesthesia Physical ?Anesthesia Plan ? ?ASA: 3 ? ?Anesthesia Plan: General  ? ?Post-op Pain Management: Dilaudid IV  ? ?Induction: Intravenous ? ?PONV Risk Score and Plan: 3 and Ondansetron, Dexamethasone, Midazolam and Treatment may vary due to age or medical condition ? ?Airway Management Planned: Oral ETT ? ?Additional Equipment:  ? ?Intra-op Plan:  ? ?Post-operative Plan: Extubation in OR ? ?Informed Consent: I have reviewed the patients History and Physical, chart, labs and discussed the procedure including the risks, benefits and alternatives for the proposed anesthesia with the patient or authorized representative who has indicated his/her understanding and acceptance.  ? ? ? ?Dental advisory given ? ?Plan Discussed with: CRNA and Surgeon ? ?Anesthesia Plan Comments: (PAT note by Karoline Caldwell, PA-C: ?History of DVT/PE August 2018 following knee  surgery.  She completed anticoagulation therapy.  She had cardiology evaluation October 2021 at [facility] prior to undergoing revision bariatric surgery.  Echo June 2021 showed EF 65%, normal LV diastolic function, normal valves.  Cardiolite stress test June 2021 showed no evidence of ischemia or infarction.  Patient was cleared to undergo laparoscopic revision of gastric bypass surgery which she subsequently had November 1610 without complication. ? ?Prior diagnosis of OSA, pt currently denies use of CPAP. ? ?Preop labs reviewed, unremarkable ? ?EKG 03/03/21 (in setting of sepsis): Sinus tachycardia. Rate 118. Low voltage, precordial leads. Nonspecific T abnrm, anterolateral leads ? ?Cardiolite 03/03/2020 (Care Everywhere): ?Normal post stress ejection fraction of 72%.  ?Rest/stress SPECT tomographic myocardial perfusion scan is normal with no evidence of ischemia or infarction.  ?Plan: Patient with low to moderate risk for surgery. ?Continue good control of risk factors and regular exercise.  ? ?Echo 02/29/2020 (Care Everywhere): ?Summary:  ?1. The left ventricle mass index and the relative wall thickness values  ?indicate moderate concentric hypertrophy. ?  ?2. The EF is estimated at greater than 65%. Normal left ventricular  ?diastolic filling is observed.  ?3. There is mild sclerosis of the aortic valve cusps.  ?4. There is trivial tricuspid regurgitation. No pulmonary hypertension  ?is noted.  ?5. There is no pericardial effusion. No evidence of thrombus.  ?)  ? ? ? ? ? ?Anesthesia Quick Evaluation ? ?

## 2021-12-19 ENCOUNTER — Ambulatory Visit (HOSPITAL_COMMUNITY): Payer: BC Managed Care – PPO | Admitting: Physician Assistant

## 2021-12-19 ENCOUNTER — Encounter (HOSPITAL_COMMUNITY): Payer: Self-pay | Admitting: Neurosurgery

## 2021-12-19 ENCOUNTER — Ambulatory Visit (HOSPITAL_COMMUNITY): Payer: BC Managed Care – PPO

## 2021-12-19 ENCOUNTER — Ambulatory Visit (HOSPITAL_COMMUNITY): Payer: BC Managed Care – PPO | Admitting: Certified Registered Nurse Anesthetist

## 2021-12-19 ENCOUNTER — Other Ambulatory Visit: Payer: Self-pay

## 2021-12-19 ENCOUNTER — Observation Stay (HOSPITAL_COMMUNITY)
Admission: RE | Admit: 2021-12-19 | Discharge: 2021-12-20 | Disposition: A | Discharge status: Home / Self Care | Payer: BC Managed Care – PPO | Attending: Neurosurgery | Admitting: Neurosurgery

## 2021-12-19 ENCOUNTER — Encounter (HOSPITAL_COMMUNITY)
Admission: RE | Disposition: A | Discharge status: Home / Self Care | Payer: Self-pay | Source: Home / Self Care | Attending: Neurosurgery

## 2021-12-19 DIAGNOSIS — Z91040 Latex allergy status: Secondary | ICD-10-CM | POA: Diagnosis not present

## 2021-12-19 DIAGNOSIS — M4316 Spondylolisthesis, lumbar region: Secondary | ICD-10-CM | POA: Diagnosis not present

## 2021-12-19 DIAGNOSIS — M5416 Radiculopathy, lumbar region: Secondary | ICD-10-CM | POA: Diagnosis not present

## 2021-12-19 DIAGNOSIS — M5116 Intervertebral disc disorders with radiculopathy, lumbar region: Secondary | ICD-10-CM | POA: Diagnosis not present

## 2021-12-19 DIAGNOSIS — Z79899 Other long term (current) drug therapy: Secondary | ICD-10-CM | POA: Insufficient documentation

## 2021-12-19 DIAGNOSIS — I10 Essential (primary) hypertension: Secondary | ICD-10-CM

## 2021-12-19 DIAGNOSIS — M4726 Other spondylosis with radiculopathy, lumbar region: Secondary | ICD-10-CM | POA: Diagnosis not present

## 2021-12-19 DIAGNOSIS — M48061 Spinal stenosis, lumbar region without neurogenic claudication: Secondary | ICD-10-CM

## 2021-12-19 DIAGNOSIS — Z86711 Personal history of pulmonary embolism: Secondary | ICD-10-CM | POA: Insufficient documentation

## 2021-12-19 DIAGNOSIS — Z8673 Personal history of transient ischemic attack (TIA), and cerebral infarction without residual deficits: Secondary | ICD-10-CM | POA: Diagnosis not present

## 2021-12-19 DIAGNOSIS — M4326 Fusion of spine, lumbar region: Secondary | ICD-10-CM | POA: Diagnosis not present

## 2021-12-19 DIAGNOSIS — M4310 Spondylolisthesis, site unspecified: Secondary | ICD-10-CM | POA: Diagnosis present

## 2021-12-19 SURGERY — POSTERIOR LUMBAR FUSION 1 LEVEL
Anesthesia: General | Site: Back

## 2021-12-19 MED ORDER — ACETAMINOPHEN 650 MG RE SUPP: 650.0000 mg | RECTAL | Status: DC | PRN

## 2021-12-19 MED ORDER — SODIUM CHLORIDE 0.9% FLUSH: 3.0000 mL | INTRAVENOUS | Status: DC | PRN

## 2021-12-19 MED ORDER — PHENOL 1.4 % MT LIQD: 1.0000 | OROMUCOSAL | Status: DC | PRN

## 2021-12-19 MED ORDER — ONDANSETRON HCL 4 MG/2ML IJ SOLN: 4.0000 mg | Freq: Once | INTRAMUSCULAR | Status: DC | PRN

## 2021-12-19 MED ORDER — FENTANYL CITRATE (PF) 250 MCG/5ML IJ SOLN
INTRAMUSCULAR | Status: DC | PRN
  Administered 2021-12-19 (×2): 100 ug via INTRAVENOUS
  Administered 2021-12-19: 50 ug via INTRAVENOUS

## 2021-12-19 MED ORDER — FLEET ENEMA 7-19 GM/118ML RE ENEM: 1.0000 | ENEMA | Freq: Once | RECTAL | Status: DC | PRN

## 2021-12-19 MED ORDER — 0.9 % SODIUM CHLORIDE (POUR BTL) OPTIME
TOPICAL | Status: DC | PRN
  Administered 2021-12-19: 1000 mL

## 2021-12-19 MED ORDER — METHOCARBAMOL 500 MG PO TABS
500.0000 mg | ORAL_TABLET | Freq: Four times a day (QID) | ORAL | Status: DC | PRN
  Administered 2021-12-19 – 2021-12-20 (×4): 500 mg via ORAL
  Filled 2021-12-19 (×3): qty 1

## 2021-12-19 MED ORDER — DEXAMETHASONE SODIUM PHOSPHATE 10 MG/ML IJ SOLN
INTRAMUSCULAR | Status: DC | PRN
Start: 2021-12-19 — End: 2021-12-19
  Administered 2021-12-19: 10 mg via INTRAVENOUS

## 2021-12-19 MED ORDER — HYDROMORPHONE HCL 1 MG/ML IJ SOLN
INTRAMUSCULAR | Status: AC
  Filled 2021-12-19: qty 1

## 2021-12-19 MED ORDER — SUVOREXANT 20 MG PO TABS: 20.0000 mg | ORAL_TABLET | Freq: Every day | ORAL | Status: DC

## 2021-12-19 MED ORDER — LATANOPROST 0.005 % OP SOLN
1.0000 [drp] | Freq: Every day | OPHTHALMIC | Status: DC
  Filled 2021-12-19: qty 2.5

## 2021-12-19 MED ORDER — METHOCARBAMOL 1000 MG/10ML IJ SOLN
500.0000 mg | Freq: Four times a day (QID) | INTRAVENOUS | Status: DC | PRN
  Filled 2021-12-19: qty 5

## 2021-12-19 MED ORDER — THROMBIN 20000 UNITS EX SOLR
CUTANEOUS | Status: AC
  Filled 2021-12-19: qty 20000

## 2021-12-19 MED ORDER — ONDANSETRON HCL 4 MG/2ML IJ SOLN: 4.0000 mg | Freq: Four times a day (QID) | INTRAMUSCULAR | Status: DC | PRN

## 2021-12-19 MED ORDER — FUROSEMIDE 20 MG PO TABS
20.0000 mg | ORAL_TABLET | Freq: Every day | ORAL | Status: DC
  Filled 2021-12-19: qty 1

## 2021-12-19 MED ORDER — OXYCODONE HCL 5 MG PO TABS: 5.0000 mg | ORAL_TABLET | Freq: Once | ORAL | Status: DC | PRN

## 2021-12-19 MED ORDER — GELATIN ABSORBABLE 100 EX MISC
CUTANEOUS | Status: DC | PRN
Start: 2021-12-19 — End: 2021-12-19
  Administered 2021-12-19: 20 mL via TOPICAL

## 2021-12-19 MED ORDER — FENTANYL CITRATE (PF) 250 MCG/5ML IJ SOLN
INTRAMUSCULAR | Status: AC
  Filled 2021-12-19: qty 5

## 2021-12-19 MED ORDER — LACTATED RINGERS IV SOLN: INTRAVENOUS | Status: DC

## 2021-12-19 MED ORDER — LORATADINE 10 MG PO TABS
10.0000 mg | ORAL_TABLET | Freq: Every day | ORAL | Status: DC
  Administered 2021-12-20: 10 mg via ORAL
  Filled 2021-12-19: qty 1

## 2021-12-19 MED ORDER — PANTOPRAZOLE SODIUM 40 MG PO TBEC
40.0000 mg | DELAYED_RELEASE_TABLET | Freq: Every day | ORAL | Status: DC
  Administered 2021-12-20: 40 mg via ORAL
  Filled 2021-12-19: qty 1

## 2021-12-19 MED ORDER — ROCURONIUM BROMIDE 10 MG/ML (PF) SYRINGE
PREFILLED_SYRINGE | INTRAVENOUS | Status: DC | PRN
  Administered 2021-12-19: 80 mg via INTRAVENOUS
  Administered 2021-12-19: 20 mg via INTRAVENOUS

## 2021-12-19 MED ORDER — POLYETHYLENE GLYCOL 3350 17 G PO PACK: 17.0000 g | PACK | Freq: Every day | ORAL | Status: DC | PRN

## 2021-12-19 MED ORDER — HYDROCODONE-ACETAMINOPHEN 10-325 MG PO TABS: 1.0000 | ORAL_TABLET | ORAL | Status: DC | PRN

## 2021-12-19 MED ORDER — VANCOMYCIN HCL IN DEXTROSE 1-5 GM/200ML-% IV SOLN
1000.0000 mg | INTRAVENOUS | Status: AC
  Administered 2021-12-19: 1000 mg via INTRAVENOUS
  Filled 2021-12-19: qty 200

## 2021-12-19 MED ORDER — ONDANSETRON HCL 4 MG/2ML IJ SOLN
INTRAMUSCULAR | Status: DC | PRN
Start: 2021-12-19 — End: 2021-12-19
  Administered 2021-12-19: 4 mg via INTRAVENOUS

## 2021-12-19 MED ORDER — PROPOFOL 10 MG/ML IV BOLUS
INTRAVENOUS | Status: AC
  Filled 2021-12-19: qty 20

## 2021-12-19 MED ORDER — BUPIVACAINE HCL (PF) 0.25 % IJ SOLN
INTRAMUSCULAR | Status: DC | PRN
  Administered 2021-12-19: 20 mL

## 2021-12-19 MED ORDER — METHOCARBAMOL 500 MG PO TABS
ORAL_TABLET | ORAL | Status: AC
  Filled 2021-12-19: qty 1

## 2021-12-19 MED ORDER — SUGAMMADEX SODIUM 200 MG/2ML IV SOLN
INTRAVENOUS | Status: DC | PRN
  Administered 2021-12-19: 200 mg via INTRAVENOUS

## 2021-12-19 MED ORDER — ONDANSETRON HCL 4 MG PO TABS: 4.0000 mg | ORAL_TABLET | Freq: Four times a day (QID) | ORAL | Status: DC | PRN

## 2021-12-19 MED ORDER — FLUTICASONE PROPIONATE 50 MCG/ACT NA SUSP: 1.0000 | Freq: Every day | NASAL | Status: DC | PRN

## 2021-12-19 MED ORDER — PHENYLEPHRINE 40 MCG/ML (10ML) SYRINGE FOR IV PUSH (FOR BLOOD PRESSURE SUPPORT)
PREFILLED_SYRINGE | INTRAVENOUS | Status: DC | PRN
  Administered 2021-12-19: 40 ug via INTRAVENOUS

## 2021-12-19 MED ORDER — ACETAMINOPHEN 10 MG/ML IV SOLN
1000.0000 mg | Freq: Once | INTRAVENOUS | Status: DC | PRN
  Administered 2021-12-19: 1000 mg via INTRAVENOUS

## 2021-12-19 MED ORDER — HYDROMORPHONE HCL 1 MG/ML IJ SOLN: 1.0000 mg | INTRAMUSCULAR | Status: DC | PRN

## 2021-12-19 MED ORDER — BUPIVACAINE HCL (PF) 0.25 % IJ SOLN
INTRAMUSCULAR | Status: AC
  Filled 2021-12-19: qty 30

## 2021-12-19 MED ORDER — LIDOCAINE 2% (20 MG/ML) 5 ML SYRINGE
INTRAMUSCULAR | Status: DC | PRN
  Administered 2021-12-19: 100 mg via INTRAVENOUS

## 2021-12-19 MED ORDER — OXYCODONE HCL 5 MG/5ML PO SOLN: 5.0000 mg | Freq: Once | ORAL | Status: DC | PRN

## 2021-12-19 MED ORDER — CYCLOBENZAPRINE HCL 10 MG PO TABS: 10.0000 mg | ORAL_TABLET | Freq: Every day | ORAL | Status: DC | PRN

## 2021-12-19 MED ORDER — BISACODYL 10 MG RE SUPP: 10.0000 mg | Freq: Every day | RECTAL | Status: DC | PRN

## 2021-12-19 MED ORDER — VANCOMYCIN HCL 1500 MG/300ML IV SOLN
1500.0000 mg | Freq: Once | INTRAVENOUS | Status: AC
Start: 2021-12-19 — End: 2021-12-20
  Administered 2021-12-19: 1500 mg via INTRAVENOUS
  Filled 2021-12-19: qty 300

## 2021-12-19 MED ORDER — CHLORHEXIDINE GLUCONATE 0.12 % MT SOLN
15.0000 mL | Freq: Once | OROMUCOSAL | Status: AC
  Administered 2021-12-19: 15 mL via OROMUCOSAL
  Filled 2021-12-19: qty 15

## 2021-12-19 MED ORDER — DEXAMETHASONE SODIUM PHOSPHATE 10 MG/ML IJ SOLN
INTRAMUSCULAR | Status: AC
  Filled 2021-12-19: qty 1

## 2021-12-19 MED ORDER — VANCOMYCIN HCL 1000 MG IV SOLR
INTRAVENOUS | Status: DC | PRN
  Administered 2021-12-19: 1000 mg

## 2021-12-19 MED ORDER — ONDANSETRON HCL 4 MG/2ML IJ SOLN
INTRAMUSCULAR | Status: AC
  Filled 2021-12-19: qty 2

## 2021-12-19 MED ORDER — MIDAZOLAM HCL 2 MG/2ML IJ SOLN
INTRAMUSCULAR | Status: DC | PRN
  Administered 2021-12-19: 2 mg via INTRAVENOUS

## 2021-12-19 MED ORDER — ROCURONIUM BROMIDE 10 MG/ML (PF) SYRINGE
PREFILLED_SYRINGE | INTRAVENOUS | Status: AC
  Filled 2021-12-19: qty 10

## 2021-12-19 MED ORDER — VANCOMYCIN HCL 1000 MG IV SOLR
INTRAVENOUS | Status: AC
  Filled 2021-12-19: qty 20

## 2021-12-19 MED ORDER — SODIUM CHLORIDE 0.9% FLUSH: 3.0000 mL | Freq: Two times a day (BID) | INTRAVENOUS | Status: DC

## 2021-12-19 MED ORDER — ORAL CARE MOUTH RINSE: 15.0000 mL | Freq: Once | OROMUCOSAL | Status: AC

## 2021-12-19 MED ORDER — GABAPENTIN 300 MG PO CAPS
600.0000 mg | ORAL_CAPSULE | Freq: Every day | ORAL | Status: DC
  Administered 2021-12-19: 600 mg via ORAL
  Filled 2021-12-19: qty 2

## 2021-12-19 MED ORDER — CHLORHEXIDINE GLUCONATE CLOTH 2 % EX PADS: 6.0000 | MEDICATED_PAD | Freq: Once | CUTANEOUS | Status: DC

## 2021-12-19 MED ORDER — SCOPOLAMINE 1 MG/3DAYS TD PT72: 1.0000 | MEDICATED_PATCH | TRANSDERMAL | Status: DC

## 2021-12-19 MED ORDER — MENTHOL 3 MG MT LOZG
1.0000 | LOZENGE | OROMUCOSAL | Status: DC | PRN
  Filled 2021-12-19: qty 9

## 2021-12-19 MED ORDER — ACETAMINOPHEN 10 MG/ML IV SOLN
INTRAVENOUS | Status: AC
  Filled 2021-12-19: qty 100

## 2021-12-19 MED ORDER — LEVOCETIRIZINE DIHYDROCHLORIDE 5 MG PO TABS: 5.0000 mg | ORAL_TABLET | Freq: Every evening | ORAL | Status: DC

## 2021-12-19 MED ORDER — CYANOCOBALAMIN 1000 MCG/ML IJ SOLN: 1000.0000 ug | INTRAMUSCULAR | Status: DC

## 2021-12-19 MED ORDER — OXYCODONE HCL 5 MG PO TABS
10.0000 mg | ORAL_TABLET | ORAL | Status: DC | PRN
  Administered 2021-12-19 – 2021-12-20 (×7): 10 mg via ORAL
  Filled 2021-12-19 (×7): qty 2

## 2021-12-19 MED ORDER — PROPOFOL 10 MG/ML IV BOLUS
INTRAVENOUS | Status: DC | PRN
  Administered 2021-12-19: 10 mg via INTRAVENOUS
  Administered 2021-12-19: 160 mg via INTRAVENOUS
  Administered 2021-12-19: 10 mg via INTRAVENOUS
  Administered 2021-12-19: 20 mg via INTRAVENOUS

## 2021-12-19 MED ORDER — MIDAZOLAM HCL 2 MG/2ML IJ SOLN
INTRAMUSCULAR | Status: AC
  Filled 2021-12-19: qty 2

## 2021-12-19 MED ORDER — HYDROMORPHONE HCL 1 MG/ML IJ SOLN
0.2500 mg | INTRAMUSCULAR | Status: DC | PRN
  Administered 2021-12-19 (×3): 0.5 mg via INTRAVENOUS

## 2021-12-19 MED ORDER — SODIUM CHLORIDE 0.9 % IV SOLN
250.0000 mL | INTRAVENOUS | Status: DC
  Administered 2021-12-19: 250 mL via INTRAVENOUS

## 2021-12-19 MED ORDER — ACETAMINOPHEN 325 MG PO TABS
650.0000 mg | ORAL_TABLET | ORAL | Status: DC | PRN
  Administered 2021-12-19 – 2021-12-20 (×3): 650 mg via ORAL
  Filled 2021-12-19 (×3): qty 2

## 2021-12-19 MED ORDER — LIDOCAINE 2% (20 MG/ML) 5 ML SYRINGE
INTRAMUSCULAR | Status: AC
  Filled 2021-12-19: qty 5

## 2021-12-19 MED ORDER — CHLORHEXIDINE GLUCONATE CLOTH 2 % EX PADS
6.0000 | MEDICATED_PAD | Freq: Once | CUTANEOUS | Status: DC
Start: 2021-12-19 — End: 2021-12-19

## 2021-12-19 SURGICAL SUPPLY — 62 items
ADH SKN CLS APL DERMABOND .7 (GAUZE/BANDAGES/DRESSINGS) ×1
APL SKNCLS STERI-STRIP NONHPOA (GAUZE/BANDAGES/DRESSINGS) ×1
BAG COUNTER SPONGE SURGICOUNT (BAG) ×3 IMPLANT
BAG SPNG CNTER NS LX DISP (BAG) ×2
BENZOIN TINCTURE PRP APPL 2/3 (GAUZE/BANDAGES/DRESSINGS) ×2 IMPLANT
BONE GRAFTON DBF INJECT 9CC (Bone Implant) ×1 IMPLANT
BUR CUTTER 7.0 ROUND (BURR) IMPLANT
BUR MATCHSTICK NEURO 3.0 LAGG (BURR) ×2 IMPLANT
CAGE EXP CATALYFT 9 (Plate) ×2 IMPLANT
CANISTER SUCT 3000ML PPV (MISCELLANEOUS) ×2 IMPLANT
CAP LCK SPNE (Orthopedic Implant) ×4 IMPLANT
CAP LOCK SPINE RADIUS (Orthopedic Implant) IMPLANT
CAP LOCKING (Orthopedic Implant) ×8 IMPLANT
CARTRIDGE OIL MAESTRO DRILL (MISCELLANEOUS) ×1 IMPLANT
CATH FOLEY LATEX FREE 16FR (CATHETERS) ×2
CATH FOLEY LF 16FR (CATHETERS) IMPLANT
CLSR STERI-STRIP ANTIMIC 1/2X4 (GAUZE/BANDAGES/DRESSINGS) ×1 IMPLANT
CNTNR URN SCR LID CUP LEK RST (MISCELLANEOUS) ×1 IMPLANT
CONT SPEC 4OZ STRL OR WHT (MISCELLANEOUS) ×2
COVER BACK TABLE 60X90IN (DRAPES) ×2 IMPLANT
DERMABOND ADVANCED (GAUZE/BANDAGES/DRESSINGS) ×1
DERMABOND ADVANCED .7 DNX12 (GAUZE/BANDAGES/DRESSINGS) ×1 IMPLANT
DIFFUSER DRILL AIR PNEUMATIC (MISCELLANEOUS) ×2 IMPLANT
DRAPE C-ARM 42X72 X-RAY (DRAPES) ×4 IMPLANT
DRAPE HALF SHEET 40X57 (DRAPES) IMPLANT
DRAPE LAPAROTOMY 100X72X124 (DRAPES) ×2 IMPLANT
DRSG OPSITE POSTOP 4X6 (GAUZE/BANDAGES/DRESSINGS) ×2 IMPLANT
DRSG OPSITE POSTOP 4X8 (GAUZE/BANDAGES/DRESSINGS) ×1 IMPLANT
DURAPREP 26ML APPLICATOR (WOUND CARE) ×2 IMPLANT
ELECT REM PT RETURN 9FT ADLT (ELECTROSURGICAL) ×2
ELECTRODE REM PT RTRN 9FT ADLT (ELECTROSURGICAL) ×1 IMPLANT
EVACUATOR 1/8 PVC DRAIN (DRAIN) IMPLANT
GAUZE 4X4 16PLY ~~LOC~~+RFID DBL (SPONGE) ×1 IMPLANT
GAUZE SPONGE 4X4 12PLY STRL (GAUZE/BANDAGES/DRESSINGS) IMPLANT
GLOVE SURG POLYISO LF SZ6 (GLOVE) ×2 IMPLANT
GLOVE SURG POLYISO LF SZ9 (GLOVE) ×2 IMPLANT
GLOVE SURG UNDER POLY LF SZ6.5 (GLOVE) ×2 IMPLANT
GLOVE SURG UNDER POLY LF SZ7 (GLOVE) ×2 IMPLANT
GOWN STRL REUS W/ TWL LRG LVL3 (GOWN DISPOSABLE) IMPLANT
GOWN STRL REUS W/ TWL XL LVL3 (GOWN DISPOSABLE) ×2 IMPLANT
GOWN STRL REUS W/TWL 2XL LVL3 (GOWN DISPOSABLE) IMPLANT
GOWN STRL REUS W/TWL LRG LVL3 (GOWN DISPOSABLE)
GOWN STRL REUS W/TWL XL LVL3 (GOWN DISPOSABLE) ×8
KIT BASIN OR (CUSTOM PROCEDURE TRAY) ×2 IMPLANT
KIT TURNOVER KIT B (KITS) ×2 IMPLANT
MILL MEDIUM DISP (BLADE) ×2 IMPLANT
NEEDLE HYPO 22GX1.5 SAFETY (NEEDLE) ×2 IMPLANT
NS IRRIG 1000ML POUR BTL (IV SOLUTION) ×2 IMPLANT
OIL CARTRIDGE MAESTRO DRILL (MISCELLANEOUS) ×2
PACK LAMINECTOMY NEURO (CUSTOM PROCEDURE TRAY) ×2 IMPLANT
ROD RADIUS 40MM (Neuro Prosthesis/Implant) ×4 IMPLANT
ROD SPNL 40X5.5XNS TI RDS (Neuro Prosthesis/Implant) IMPLANT
SCREW 5.75X45MM (Screw) ×2 IMPLANT
SPONGE SURGIFOAM ABS GEL 100 (HEMOSTASIS) ×2 IMPLANT
STRIP CLOSURE SKIN 1/2X4 (GAUZE/BANDAGES/DRESSINGS) ×4 IMPLANT
SUT VIC AB 0 CT1 18XCR BRD8 (SUTURE) ×2 IMPLANT
SUT VIC AB 0 CT1 8-18 (SUTURE) ×2
SUT VIC AB 2-0 CT1 18 (SUTURE) ×2 IMPLANT
SUT VIC AB 3-0 SH 8-18 (SUTURE) ×3 IMPLANT
TOWEL GREEN STERILE (TOWEL DISPOSABLE) ×2 IMPLANT
TOWEL GREEN STERILE FF (TOWEL DISPOSABLE) ×2 IMPLANT
WATER STERILE IRR 1000ML POUR (IV SOLUTION) ×2 IMPLANT

## 2021-12-19 NOTE — Anesthesia Postprocedure Evaluation (Signed)
Anesthesia Post Note ? ?Patient: Kimberly Cowan ? ?Procedure(s) Performed: Posterior Lumbar Interbody Fusion - Lumbar four-Lumbar five (Back) ? ?  ? ?Patient location during evaluation: PACU ?Anesthesia Type: General ?Level of consciousness: awake and alert ?Pain management: pain level controlled ?Vital Signs Assessment: post-procedure vital signs reviewed and stable ?Respiratory status: spontaneous breathing, nonlabored ventilation, respiratory function stable and patient connected to nasal cannula oxygen ?Cardiovascular status: blood pressure returned to baseline and stable ?Postop Assessment: no apparent nausea or vomiting ?Anesthetic complications: no ? ? ?No notable events documented. ? ?Last Vitals:  ?Vitals:  ? 12/19/21 1225 12/19/21 1257  ?BP: 121/79 131/89  ?Pulse: 71 77  ?Resp: (!) 25 20  ?Temp:  36.6 ?C  ?SpO2: 92% 100%  ?  ?Last Pain:  ?Vitals:  ? 12/19/21 1257  ?TempSrc: Oral  ?PainSc:   ? ? ?  ?  ?  ?  ?  ?  ? ?Adrina Armijo S ? ? ? ? ?

## 2021-12-19 NOTE — Anesthesia Procedure Notes (Signed)
Procedure Name: Intubation ?Date/Time: 12/19/2021 8:45 AM ?Performed by: Betha Loa, CRNA ?Pre-anesthesia Checklist: Patient identified, Emergency Drugs available, Suction available and Patient being monitored ?Patient Re-evaluated:Patient Re-evaluated prior to induction ?Oxygen Delivery Method: Circle System Utilized ?Preoxygenation: Pre-oxygenation with 100% oxygen ?Induction Type: IV induction ?Ventilation: Mask ventilation without difficulty and Oral airway inserted - appropriate to patient size ?Laryngoscope Size: Mac and 3 ?Grade View: Grade I ?Tube type: Oral ?Tube size: 7.0 mm ?Number of attempts: 1 ?Airway Equipment and Method: Stylet and Oral airway ?Placement Confirmation: ETT inserted through vocal cords under direct vision, positive ETCO2 and breath sounds checked- equal and bilateral ?Secured at: 22 cm ?Tube secured with: Tape ?Dental Injury: Teeth and Oropharynx as per pre-operative assessment  ?Comments: Smooth IV induction with MDA, CRNA and EMT student-Mina.  Easy mask with OPA. DL with MAC3 per EMT student and direct instructions per MDA and CRNA. Grade 1 view reported, atraumatic intubation. ? ? ? ? ?

## 2021-12-19 NOTE — Op Note (Signed)
Date of procedure: 12/19/2021 ? ?Date of dictation: Same ? ?Service: Neurosurgery ? ?Preoperative diagnosis: Grade 1 L4-5 degenerative spondylolisthesis with facet arthropathy and stenosis with radiculopathy ? ?Postoperative diagnosis: Same ? ?Procedure Name: Bilateral L4-5 decompressive laminotomies and foraminotomies, more than would be required for interbody fusion alone. ? ?L4-5 posterior lumbar laminae fusion with interbody cages local autograft , and morselized allograft ? ?L4-5 posterior lateral arthrodesis utilizing none segmental pedicle screw fixation and local autografting. ? ?Removal of S1 pedicle screws ? ?Surgeon:Haille Pardi A.Ching Rabideau, M.D. ? ?Asst. Surgeon: Kathyrn Sheriff, MD; Reinaldo Meeker, NP ? ?Anesthesia: General ? ?Indication: 56 year old female remotely status post L5-S1 decompression and fusion presents with worsening back and lower extremity pain failing all conservative manage.  Work-up demonstrates evidence of marked degeneration at the L4-5 level with anterior listhesis with flexion and marked facet arthropathy.  Patient has failed conservative management presents now for lumbar decompression and fusion at L4-5 in hopes of improving her symptoms. ? ?Operative note: After induction anesthesia, patient position prone onto Wilson frame and properly padded.  Lumbar region prepped and draped sterilely.  Incision made from L4-S1.  Dissection performed bilaterally.  Retractor placed.  Fluoroscopy used.  Levels confirmed.  Decompressive laminotomy was then performed bilaterally using Leksell rongeurs and Kerrison rongeurs to remove the inferior two thirds of the lamina of L4 the entire inferior facet and the majority the superior facet of L4 and L5 respectively.  Ligament flavum and epidural scar were elevated and resected.  Foraminotomies completed on the course exiting L4 and L5 nerve roots bilaterally.  Epidural venous plexus was coagulated and cut.  Bilateral discectomies then performed.  This case then prepared  for interbody fusion.  With distractor placed patient's right side.  The space was cleaned of all soft tissue.  A 9 mm Med[employer]onic expandable cage was then impacted in place and expanded.  Distractor removed patient's contralateral side.  Disc base prepared on the right side.  Morselized autograft was then packed in the interspace.  Second cage was then impacted in the place and expanded.  Pedicles of L4 were identified bilaterally using surface landmarks and intraoperative fluoroscopy and superficial bone around the pedicle was then removed and high-speed drill.  Pedicle was then probed using a pedicle all each pedicle tract was then probed and found to be solidly within the bone.  Each pedicle tract was then tapped with a screw tap.  Screw temple was probed and found to be solid within the bone.  5.75 x 45 mm radius brand screws from [employer] placed bilaterally at L4.  Final images revealed good position the cages and the hardware at the proper upper level with normal alignment of spine.  Previously placed L5-S1 instrumentation was dissected free, disassembled and the rod was removed.  The fusion was inspected and found to be stable and solid.  S1 screws were removed.  L5 screws were left in place.  Transverse processes were decorticated.  Morselized autograft was packed posterior laterally.  Short segment titanium rod placed over the screw heads at L4 and L5.  Locking caps placed over the screws and locking caps and engaged with a construct under compression.  Graft done DBX was then packed into the cages bilaterally.  Gelfoam was placed over the laminotomy sites.  Vancomycin powder was placed in deep wound space.  Wounds then closed in layers.  Steri-Strips and sterile dressing were applied.  No apparent complications.  Patient tolerated the procedure well and she returns to recovery room postop. ? ?

## 2021-12-19 NOTE — Brief Op Note (Signed)
12/19/2021 ? ?11:14 AM ? ?PATIENT:  Kimberly Cowan  56 y.o. female ? ?PRE-OPERATIVE DIAGNOSIS:  Stenosis ? ?POST-OPERATIVE DIAGNOSIS:  Stenosis ? ?PROCEDURE:  Procedure(s): ?Posterior Lumbar Interbody Fusion - Lumbar four-Lumbar five (N/A) ? ?SURGEON:  Surgeon(s) and Role: ?   Earnie Larsson, MD - Primary ?   Consuella Lose, MD - Assisting ? ?PHYSICIAN ASSISTANT:  ? ?ASSISTANTS: Bergman,NP  ? ?ANESTHESIA:   general ? ?EBL:  200 mL  ? ?BLOOD ADMINISTERED:none ? ?DRAINS: none  ? ?LOCAL MEDICATIONS USED:  MARCAINE    ? ?SPECIMEN:  No Specimen ? ?DISPOSITION OF SPECIMEN:  N/A ? ?COUNTS:  YES ? ?TOURNIQUET:  * No tourniquets in log * ? ?DICTATION: .Dragon Dictation ? ?PLAN OF CARE: Admit for overnight observation ? ?PATIENT DISPOSITION:  PACU - hemodynamically stable. ?  ?Delay start of Pharmacological VTE agent (>24hrs) due to surgical blood loss or risk of bleeding: yes ? ?

## 2021-12-19 NOTE — Progress Notes (Signed)
Orthopedic Tech Progress Note ?Patient Details:  ?Kimberly Cowan ?09-16-1966 ?[id number] ? ?Ortho Devices ?Type of Ortho Device: Lumbar corsett ?Ortho Device/Splint Interventions: Ordered ?  ?  ? ?[name] ?12/19/2021, 1:49 PM ? ?

## 2021-12-19 NOTE — Transfer of Care (Signed)
Immediate Anesthesia Transfer of Care Note ? ?Patient: Kimberly Cowan ? ?Procedure(s) Performed: Posterior Lumbar Interbody Fusion - Lumbar four-Lumbar five (Back) ? ?Patient Location: PACU ? ?Anesthesia Type:General ? ?Level of Consciousness: drowsy, patient cooperative and responds to stimulation ? ?Airway & Oxygen Therapy: Patient Spontanous Breathing and Patient connected to nasal cannula oxygen ? ?Post-op Assessment: Report given to RN, Post -op Vital signs reviewed and stable and Patient moving all extremities X 4 ? ?Post vital signs: Reviewed and stable ? ?Last Vitals:  ?Vitals Value Taken Time  ?BP 125/71 12/19/21 1126  ?Temp    ?Pulse 72 12/19/21 1130  ?Resp 20 12/19/21 1130  ?SpO2 98 % 12/19/21 1130  ?Vitals shown include unvalidated device data. ? ?Last Pain:  ?Vitals:  ? 12/19/21 0635  ?TempSrc:   ?PainSc: 7   ?   ? ?Patients Stated Pain Goal: 3 (12/19/21 1505) ? ?Complications: No notable events documented. ?

## 2021-12-19 NOTE — H&P (Signed)
?Kimberly Cowan is an 56 y.o. female.   ?Chief Complaint: Back pain ?HPI: 56 year old female remotely status post L5-S1 decompression and fusion surgery presents with worsening back and lower extremity pain failing conservative management work-up demonstrates evidence of significant adjacent level degeneration with facet joint arthropathy with diastases and some instability with resultant degenerative spondylolisthesis at L4-5 with accompanying stenosis.  Patient has failed conservative management presents now for decompression and fusion surgery. ? ?Past Medical History:  ?Diagnosis Date  ? Allergy 1999  ? SEASONAL  ? Anemia   ? Anxiety   ? Arm pain, left 05/17/2018  ? Arthritis   ? lower spine  ? Bilateral lumbar radiculopathy   ? Cellulitis of abdominal wall 03/03/2021  ? Chronic back pain   ? Chronic GERD 03/03/2021  ? Clinical depression 04/04/2012  ? Depression   ? DVT (deep venous thrombosis) ([place])   ? Encounter for screening colonoscopy   ? Essential (primary) hypertension 06/04/2020  ? Glaucoma   ? RIGHT EYE  ? Headache(784.0)   ? migraines  ? Major depressive disorder with single episode, in full remission ([place]) 08/15/2018  ? Malfunction of jejunostomy tube ([place]) 02/13/2021  ? Ovarian cyst   ? Pneumonia   ? walking pneumonia  ? Pulmonary embolism ([place]) 05/2017  ? Provoked, following knee surgery  ? S/P laparoscopic cholecystectomy 02/07/2021  ? Sleep apnea   ? no cpcap     last sleep study > 4 yrs  ? ? ?Past Surgical History:  ?Procedure Laterality Date  ? ABDOMINAL HYSTERECTOMY  10/2006  ? BACK SURGERY  2014  ? lumbar fusion  ? BACK SURGERY  2009  ? discectomy  ? BARIATRIC SURGERY  07/2020  ? Revision  ? [place]  ? CHOLECYSTECTOMY  01/2021  ? COLONOSCOPY WITH PROPOFOL N/A 04/12/2018  ? Procedure: COLONOSCOPY WITH PROPOFOL;  Surgeon: Lin Landsman, MD;  Location: [facility] ENDOSCOPY;  Service: Gastroenterology;  Laterality: N/A;  ? GASTRIC BYPASS  2005  ? KNEE  ARTHROSCOPY WITH LATERAL MENISECTOMY Left 05/27/2017  ? Procedure: KNEE ARTHROSCOPY WITH LATERAL MENISECTOMY;  Surgeon: Hessie Knows, MD;  Location: [facility] ORS;  Service: Orthopedics;  Laterality: Left;  ? KNEE ARTHROSCOPY WITH LATERAL RELEASE Left 05/27/2017  ? Procedure: KNEE ARTHROSCOPY WITH LATERAL RELEASE;  Surgeon: Hessie Knows, MD;  Location: [facility] ORS;  Service: Orthopedics;  Laterality: Left;  ? LAPAROSCOPIC SALPINGO OOPHERECTOMY Bilateral 10/05/2016  ? Procedure: LAPAROSCOPIC SALPINGO OOPHORECTOMY;  Surgeon: Brayton Mars, MD;  Location: [facility] ORS;  Service: Gynecology;  Laterality: Bilateral;  ? REDUCTION MAMMAPLASTY Bilateral   ? 2001  ? SPINAL CORD STIMULATOR INSERTION N/A 03/20/2016  ? Procedure: LUMBAR SPINAL CORD STIMULATOR INSERTION;  Surgeon: Eustace Moore, MD;  Location: [place] [facility]URO ORS;  Service: Neurosurgery;  Laterality: N/A;  ? [place] SURGERY  2009 2014 2017  ? TONSILLECTOMY    ? ? ?Family History  ?Problem Relation Age of Onset  ? Diabetes Mother   ? Hypertension Mother   ? Obesity Mother   ? Arthritis Mother   ? Varicose Veins Mother   ? Diabetes Sister   ? Hypertension Sister   ? Arthritis Sister   ? Hypertension Father   ? Arthritis Father   ? Cancer Father   ? Obesity Father   ? Obesity Daughter   ? Obesity Sister   ? Arthritis Sister   ? Obesity Sister   ? Diabetes Maternal Grandmother   ? Cancer Maternal Aunt   ? Diabetes Maternal Aunt   ?  Breast cancer Neg Hx   ? ?Social History:  reports that she has never smoked. She has never used smokeless tobacco. She reports that she does not drink alcohol and does not use drugs. ? ?Allergies:  ?Allergies  ?Allergen Reactions  ? Morphine And Related Hives, Shortness Of Breath, Nausea And Vomiting and Other (See Comments)  ?  "can't breathe"  ? Penicillins Anaphylaxis, Hives, Shortness Of Breath and Nausea And Vomiting  ?  "can't breathe", Has patient had a PCN reaction causing immediate rash, facial/tongue/throat swelling, SOB or lightheadedness  with hypotension: Yes ?Has patient had a PCN reaction causing severe rash involving mucus membranes or skin necrosis: No ?Has patient had a PCN reaction that required hospitalization No ?Has patient had a PCN reaction occurring within the last 10 years: No ?If all of the above answers are "NO", then may proceed with Cephalosporin use. ?  ? Latex Hives  ? Baclofen Nausea Only  ?  Jittery, anxious  ? ? ?Facility-Administered Medications Prior to Admission  ?Medication Dose Route Frequency Provider Last Rate Last Admin  ? cyanocobalamin ((VITAMIN B-12)) injection 1,000 mcg  1,000 mcg Intramuscular Once Glean Hess, MD      ? ?Medications Prior to Admission  ?Medication Sig Dispense Refill  ? BELSOMRA 20 MG TABS TAKE ONE TABLET BY MOUTH AT BEDTIME (Patient taking differently: Take 20 mg by mouth at bedtime.) 30 tablet 2  ? cyanocobalamin (,VITAMIN B-12,) 1000 MCG/ML injection ADMINISTER 1 ML(1000 MCG) IN THE MUSCLE EVERY 30 DAYS (Patient taking differently: Inject 1,000 mcg into the muscle every 30 (thirty) days.) 1 mL 12  ? cyclobenzaprine (FLEXERIL) 10 MG tablet Take 10 mg by mouth daily as needed for muscle spasms.    ? fluticasone (FLONASE) 50 MCG/ACT nasal spray NEW PRESCRIPTION REQUEST: INSTILL ONE SPRAY IN EACH NOSTRIL EVERY DAY AS NEEDED (Patient taking differently: Place 1 spray into both nostrils daily as needed for allergies.) 48 mL 3  ? furosemide (LASIX) 20 MG tablet Take 1 tablet (20 mg total) by mouth daily. 60 tablet 0  ? gabapentin (NEURONTIN) 300 MG capsule Take 600 mg by mouth at bedtime.    ? HYDROcodone-acetaminophen (NORCO) 7.5-325 MG tablet Take 1 tablet by mouth daily as needed.    ? HYDROcodone-acetaminophen (NORCO/VICODIN) 5-325 MG tablet Take 1 tablet by mouth daily as needed for moderate pain.    ? ketoconazole (NIZORAL) 2 % shampoo Apply 1 application topically daily as needed (for skin fungus on neck/back). 120 mL 1  ? latanoprost (XALATAN) 0.005 % ophthalmic solution Place 1 drop  into both eyes at bedtime.     ? levocetirizine (XYZAL) 5 MG tablet Take 1 tablet (5 mg total) by mouth every evening. 90 tablet 2  ? Multiple Vitamins-Minerals (MULTIVITAMIN GUMMIES ADULTS PO) Take 1 tablet by mouth daily.    ? pantoprazole (PROTONIX) 40 MG tablet TAKE ONE TABLET BY MOUTH DAILY (Patient taking differently: 40 mg daily.) 90 tablet 1  ? rizatriptan (MAXALT-MLT) 10 MG disintegrating tablet DISSOLVE 1 TABLET BY MOUTH AS NEEDED FOR MIGRAINE- MAY REPEAT IN 2 HOURS AS NEEDED (Patient taking differently: Take 10 mg by mouth daily as needed for migraine. (repeat after 2 hours if needed)) 10 tablet 0  ? scopolamine (TRANSDERM-SCOP) 1 MG/3DAYS Place 1 patch onto the skin every 3 (three) days. As needed for nausea    ? ? ?No results found for this or any previous visit (from the past 48 hour(s)). ?No results found. ? ?Pertinent items noted in HPI  and remainder of comprehensive ROS otherwise negative. ? ?Blood pressure (!) 152/80, pulse 79, temperature 98.1 ?F (36.7 ?C), temperature source Oral, resp. rate 17, height '5\' 7"'$  (1.702 m), weight 110.7 kg, SpO2 92 %. ? ?Patient is awake and alert.  She is oriented and appropriate.  Speech is fluent.  Judgment insight are intact.  Cranial nerve function normal bilateral.  Motor examination intact bilaterally sensory examination with some mild distal sensory loss in both lower extremities otherwise intact.  Sent reflexes are normal.  Achilles reflexes are absent bilaterally however.  Gait is antalgic.  Posture is mildly flexed peer examination head ears eyes nose throat center.  Chest and abdomen are benign.  Extremities are free of major deformity. ?Assessment/Plan ?L4-5 degenerative spondylolisthesis with facet arthropathy and radiculopathy.  Plan bilateral L4-5 decompressive laminotomies and foraminotomies followed by posterior lumbar laminae fusion utilizing interbody cages, local harvested autograft, and augmented with posterior arthrodesis utilizing nonsegmental  pedicle screw fixation.  Risks and benefits of been explained.  Patient wishes to proceed. ? ?Mallie Mussel A Larwence Tu ?12/19/2021, 7:48 AM ? ? ? ?

## 2021-12-19 NOTE — Progress Notes (Signed)
Pharmacy Antibiotic Note ? ?Kimberly Cowan is a 56 y.o. female admitted on 12/19/2021 with surgical prophylaxis.  Pharmacy has been consulted for Vancomycin dosing. ?Vanc 1gm IV given in this morning in OR  ~0730 ? ?Plan: ?Vancomycin '1500mg'$  IV x 1 ?Pharmacy will sign off - please reconsult if needed ? ?Height: '5\' 7"'$  (170.2 cm) ?Weight: 110.7 kg (244 lb) ?IBW/kg (Calculated) : 61.6 ? ?Temp (24hrs), Avg:97.8 ?F (36.6 ?C), Min:97.6 ?F (36.4 ?C), Max:98.1 ?F (36.7 ?C) ? ?Recent Labs  ?Lab 12/16/21 ?1018  ?WBC 3.9*  ?CREATININE 0.62  ?  ?Estimated Creatinine Clearance: 104.2 mL/min (by C-G formula based on SCr of 0.62 mg/dL).   ? ?Allergies  ?Allergen Reactions  ? Morphine And Related Hives, Shortness Of Breath, Nausea And Vomiting and Other (See Comments)  ?  "can't breathe"  ? Penicillins Anaphylaxis, Hives, Shortness Of Breath and Nausea And Vomiting  ?  "can't breathe", Has patient had a PCN reaction causing immediate rash, facial/tongue/throat swelling, SOB or lightheadedness with hypotension: Yes ?Has patient had a PCN reaction causing severe rash involving mucus membranes or skin necrosis: No ?Has patient had a PCN reaction that required hospitalization No ?Has patient had a PCN reaction occurring within the last 10 years: No ?If all of the above answers are "NO", then may proceed with Cephalosporin use. ?  ? Latex Hives  ? Baclofen Nausea Only  ?  Jittery, anxious  ? ? ?Thank you for allowing pharmacy to be a part of this patient?s care. ? ?Sherlon Handing, PharmD, BCPS ?Please see amion for complete clinical pharmacist phone list ?12/19/2021 1:04 PM ? ?

## 2021-12-20 DIAGNOSIS — M5416 Radiculopathy, lumbar region: Secondary | ICD-10-CM | POA: Diagnosis not present

## 2021-12-20 DIAGNOSIS — Z8673 Personal history of transient ischemic attack (TIA), and cerebral infarction without residual deficits: Secondary | ICD-10-CM | POA: Diagnosis not present

## 2021-12-20 DIAGNOSIS — M4316 Spondylolisthesis, lumbar region: Secondary | ICD-10-CM | POA: Diagnosis not present

## 2021-12-20 DIAGNOSIS — Z91040 Latex allergy status: Secondary | ICD-10-CM | POA: Diagnosis not present

## 2021-12-20 DIAGNOSIS — I10 Essential (primary) hypertension: Secondary | ICD-10-CM | POA: Diagnosis not present

## 2021-12-20 DIAGNOSIS — Z86711 Personal history of pulmonary embolism: Secondary | ICD-10-CM | POA: Diagnosis not present

## 2021-12-20 DIAGNOSIS — Z79899 Other long term (current) drug therapy: Secondary | ICD-10-CM | POA: Diagnosis not present

## 2021-12-20 MED ORDER — OXYCODONE HCL 10 MG PO TABS: 10.0000 mg | ORAL_TABLET | ORAL | 0 refills | Status: DC | PRN

## 2021-12-20 MED ORDER — METHOCARBAMOL 500 MG PO TABS
500.0000 mg | ORAL_TABLET | Freq: Four times a day (QID) | ORAL | 1 refills | Status: DC | PRN
Start: 2021-12-20 — End: 2022-04-28

## 2021-12-20 MED ORDER — METHOCARBAMOL 500 MG PO TABS: 500.0000 mg | ORAL_TABLET | Freq: Four times a day (QID) | ORAL | 1 refills | Status: DC | PRN

## 2021-12-20 NOTE — Discharge Summary (Signed)
Physician Discharge Summary  ?Patient ID: ?Kimberly Cowan ?MRN: [id number] ?DOB/AGE: 1966/08/04 56 y.o. ? ?Admit date: 12/19/2021 ?Discharge date: 12/20/2021 ? ?Admission Diagnoses: lumbar radiculopathy  ? ? ?Discharge Diagnoses: same ? ? ?Discharged Condition: good ? ?Hospital Course: The patient was admitted on 12/19/2021 and taken to the operating room where the patient underwent PLIF. The patient tolerated the procedure well and was taken to the recovery room and then to the floor in stable condition. The hospital course was routine. There were no complications. The wound remained clean dry and intact. Pt had appropriate back soreness. No complaints of leg pain or new N/T/W. The patient remained afebrile with stable vital signs, and tolerated a regular diet. The patient continued to increase activities, and pain was well controlled with oral pain medications.  ? ?Consults: None ? ?Significant Diagnostic Studies:  ?Results for orders placed or performed during the hospital encounter of 12/16/21  ?Surgical PCR Screen  ? Specimen: Nasal Mucosa; Nasal Swab  ?Result Value Ref Range  ? MRSA, PCR NEGATIVE NEGATIVE  ? Staphylococcus aureus NEGATIVE NEGATIVE  ?CBC  ?Result Value Ref Range  ? WBC 3.9 (L) 4.0 - 10.5 K/uL  ? RBC 4.51 3.87 - 5.11 MIL/uL  ? Hemoglobin 13.2 12.0 - 15.0 g/dL  ? HCT 41.6 36.0 - 46.0 %  ? MCV 92.2 80.0 - 100.0 fL  ? MCH 29.3 26.0 - 34.0 pg  ? MCHC 31.7 30.0 - 36.0 g/dL  ? RDW 14.7 11.5 - 15.5 %  ? Platelets 197 150 - 400 K/uL  ? nRBC 0.0 0.0 - 0.2 %  ?Basic Metabolic Panel  ?Result Value Ref Range  ? Sodium 143 135 - 145 mmol/L  ? Potassium 3.2 (L) 3.5 - 5.1 mmol/L  ? Chloride 107 98 - 111 mmol/L  ? CO2 28 22 - 32 mmol/L  ? Glucose, Bld 90 70 - 99 mg/dL  ? BUN 5 (L) 6 - 20 mg/dL  ? Creatinine, Ser 0.62 0.44 - 1.00 mg/dL  ? Calcium 9.6 8.9 - 10.3 mg/dL  ? GFR, Estimated >60 >60 mL/min  ? Anion gap 8 5 - 15  ?Type and screen [place]  ?Result Value Ref Range  ?  ABO/RH(D) B POS   ? Antibody Screen NEG   ? Sample Expiration 12/30/2021,2359   ? Extend sample reason    ?  NO TRANSFUSIONS OR PREGNANCY IN THE PAST 3 MONTHS ?Performed at [facility], [place] [address] [place], [place] [address] ?  ? ? ?DG Lumbar Spine 2-3 Views ? ?Result Date: 12/19/2021 ?CLINICAL DATA:  56 year old female undergoing posterior lumbar fusion. EXAM: LUMBAR SPINE - 2-3 VIEW COMPARISON:  10/28/2021 FINDINGS: Intraoperative radiographs demonstrate interval placement of bilateral L4 pedicular screws. Unchanged appearance of L5-S1 PSIF. IMPRESSION: Interval placement of bilateral pedicular screws at L4 without complicating features. Electronically Signed   By: Ruthann Cancer M.D.   On: 12/19/2021 12:28  ? ?DG C-Arm 1-60 Min-No Report ? ?Result Date: 12/19/2021 ?Fluoroscopy was utilized by the requesting physician.  No radiographic interpretation.   ? ?Antibiotics:  ?Anti-infectives (From admission, onward)  ? ? Start     Dose/Rate Route Frequency Ordered Stop  ? 12/19/21 2000  vancomycin (VANCOREADY) IVPB 1500 mg/300 mL       ? 1,500 mg ?150 mL/hr over 120 Minutes Intravenous  Once 12/19/21 1306 12/19/21 2211  ? 12/19/21 0922  vancomycin (VANCOCIN) powder  Status:  Discontinued       ?   As  needed 12/19/21 0922 12/19/21 1123  ? 12/19/21 0615  vancomycin (VANCOCIN) IVPB 1000 mg/200 mL premix       ? 1,000 mg ?200 mL/hr over 60 Minutes Intravenous On call to O.R. 12/19/21 9518 12/19/21 0829  ? ?  ? ? ?Discharge Exam: ?Blood pressure 119/67, pulse 100, temperature 98.5 ?F (36.9 ?C), temperature source Oral, resp. rate 16, height '5\' 7"'$  (1.702 m), weight 110.7 kg, SpO2 98 %. ?Neurologic: Grossly normal ?Dressing dry ? ?Discharge Medications:   ?Allergies as of 12/20/2021   ? ?   Reactions  ? Morphine And Related Hives, Shortness Of Breath, Nausea And Vomiting, Other (See Comments)  ? "can't breathe"  ? Penicillins Anaphylaxis, Hives, Shortness Of Breath, Nausea And Vomiting  ? "can't breathe", Has patient  had a PCN reaction causing immediate rash, facial/tongue/throat swelling, SOB or lightheadedness with hypotension: Yes ?Has patient had a PCN reaction causing severe rash involving mucus membranes or skin necrosis: No ?Has patient had a PCN reaction that required hospitalization No ?Has patient had a PCN reaction occurring within the last 10 years: No ?If all of the above answers are "NO", then may proceed with Cephalosporin use.  ? Latex Hives  ? Baclofen Nausea Only  ? Jittery, anxious  ? ?  ? ?  ?Medication List  ?  ? ?STOP taking these medications   ? ?HYDROcodone-acetaminophen 5-325 MG tablet ?Commonly known as: NORCO/VICODIN ?  ?HYDROcodone-acetaminophen 7.5-325 MG tablet ?Commonly known as: NORCO ?  ? ?  ? ?TAKE these medications   ? ?Belsomra 20 MG Tabs ?Generic drug: Suvorexant ?TAKE ONE TABLET BY MOUTH AT BEDTIME ?What changed: how much to take ?  ?cyanocobalamin 1000 MCG/ML injection ?Commonly known as: (VITAMIN B-12) ?ADMINISTER 1 ML(1000 MCG) IN THE MUSCLE EVERY 30 DAYS ?What changed: See the new instructions. ?  ?cyclobenzaprine 10 MG tablet ?Commonly known as: FLEXERIL ?Take 10 mg by mouth daily as needed for muscle spasms. ?  ?fluticasone 50 MCG/ACT nasal spray ?Commonly known as: FLONASE ?NEW PRESCRIPTION REQUEST: INSTILL ONE SPRAY IN EACH NOSTRIL EVERY DAY AS NEEDED ?What changed: See the new instructions. ?  ?furosemide 20 MG tablet ?Commonly known as: LASIX ?Take 1 tablet (20 mg total) by mouth daily. ?  ?gabapentin 300 MG capsule ?Commonly known as: NEURONTIN ?Take 600 mg by mouth at bedtime. ?  ?ketoconazole 2 % shampoo ?Commonly known as: NIZORAL ?Apply 1 application topically daily as needed (for skin fungus on neck/back). ?  ?latanoprost 0.005 % ophthalmic solution ?Commonly known as: XALATAN ?Place 1 drop into both eyes at bedtime. ?  ?levocetirizine 5 MG tablet ?Commonly known as: XYZAL ?Take 1 tablet (5 mg total) by mouth every evening. ?  ?methocarbamol 500 MG tablet ?Commonly known as:  ROBAXIN ?Take 1 tablet (500 mg total) by mouth every 6 (six) hours as needed for muscle spasms. ?  ?MULTIVITAMIN GUMMIES ADULTS PO ?Take 1 tablet by mouth daily. ?  ?Oxycodone HCl 10 MG Tabs ?Take 1 tablet (10 mg total) by mouth every 4 (four) hours as needed for severe pain ((score 7 to 10)). ?  ?pantoprazole 40 MG tablet ?Commonly known as: PROTONIX ?TAKE ONE TABLET BY MOUTH DAILY ?What changed: how to take this ?  ?rizatriptan 10 MG disintegrating tablet ?Commonly known as: MAXALT-MLT ?DISSOLVE 1 TABLET BY MOUTH AS NEEDED FOR MIGRAINE- MAY REPEAT IN 2 HOURS AS NEEDED ?What changed:  ?how much to take ?how to take this ?when to take this ?reasons to take this ?additional instructions ?  ?scopolamine  1 MG/3DAYS ?Commonly known as: TRANSDERM-SCOP ?Place 1 patch onto the skin every 3 (three) days. As needed for nausea ?  ? ?  ? ?  ?  ? ? ?  ?Durable Medical Equipment  ?(From admission, onward)  ?  ? ? ?  ? ?  Start     Ordered  ? 12/19/21 1235  DME Walker rolling  Once       ?Question:  Patient needs a walker to treat with the following condition  Answer:  Degenerative spondylolisthesis  ? 12/19/21 1234  ? 12/19/21 1235  DME 3 n 1  Once       ? 12/19/21 1234  ? ?  ?  ? ?  ? ? ?Disposition: home  ? ?Final Dx: PLIF ? ?Discharge Instructions   ? ?  Remove dressing in 72 hours   Complete by: As directed ?  ? Call MD for:  difficulty breathing, headache or visual disturbances   Complete by: As directed ?  ? Call MD for:  persistant nausea and vomiting   Complete by: As directed ?  ? Call MD for:  redness, tenderness, or signs of infection (pain, swelling, redness, odor or green/yellow discharge around incision site)   Complete by: As directed ?  ? Call MD for:  severe uncontrolled pain   Complete by: As directed ?  ? Call MD for:  temperature >100.4   Complete by: As directed ?  ? Diet - low sodium heart healthy   Complete by: As directed ?  ? Increase activity slowly   Complete by: As directed ?  ? ?   ? ? ? ? ? ?Signed: ?Eustace Moore ?12/20/2021, 8:00 AM ? ? ?

## 2021-12-20 NOTE — Progress Notes (Signed)
Patient alert and oriented, mae's well, voiding adequate amount of urine, swallowing without difficulty, no c/o pain at time of discharge. Patient discharged home with family. Script and discharged instructions given to patient. Patient and family stated understanding of instructions given. Patient has an appointment with Dr. Pool in 2 weeks 

## 2021-12-20 NOTE — Evaluation (Signed)
Physical Therapy Evaluation & Discharge ?Patient Details ?Name: Kimberly Cowan ?MRN: [id number] ?DOB: 1966/06/08 ?Today's Date: 12/20/2021 ? ?History of Present Illness ? 56 y/o female admitted on 12/19/21 following PLIF L4-5. PMH: hx of PE & DVT, depression, sleep apnea, hx of back surgery  ?Clinical Impression ? Patient admitted following above procedure. Patient functioning at [place] level for mobility with use of SPC. Educated patient on brace wear, back precautions, and progressive walking program, patient verbalized understanding. Patient plans to stay with daughter at discharge until recovered. No further skilled PT needs identified acutely. No PT follow up recommended at this time.    ?   ? ?Recommendations for follow up therapy are one component of a multi-disciplinary discharge planning process, led by the attending physician.  Recommendations may be updated based on patient status, additional functional criteria and insurance authorization. ? ?Follow Up Recommendations No PT follow up ? ?  ?Assistance Recommended at Discharge PRN  ?Patient can return home with the following ?   ? ?  ?Equipment Recommendations None recommended by PT  ?Recommendations for Other Services ?    ?  ?Functional Status Assessment Patient has had a recent decline in their functional status and demonstrates the ability to make significant improvements in function in a reasonable and predictable amount of time.  ? ?  ?Precautions / Restrictions Precautions ?Precautions: Back ?Precaution Booklet Issued: Yes (comment) ?Required Braces or Orthoses: Spinal Brace ?Spinal Brace: Lumbar corset;Applied in standing position ?Restrictions ?Weight Bearing Restrictions: No  ? ?  ? ?Mobility ? Bed Mobility ?Overal bed mobility: Modified Independent ?  ?  ?  ?  ?  ?  ?General bed mobility comments: increased time and effort ?  ? ?Transfers ?Overall transfer level: Modified independent ?Equipment used: Straight cane ?  ?  ?  ?  ?  ?  ?   ?General transfer comment: increased time and effort to stand but no need for physical assist ?  ? ?Ambulation/Gait ?Ambulation/Gait assistance: Modified independent (Device/Increase time) ?Gait Distance (Feet): 200 Feet ?Assistive device: Straight cane ?Gait Pattern/deviations: WFL(Within Functional Limits) ?Gait velocity: decreased ?  ?  ?General Gait Details: slow steady gait ? ?Stairs ?Stairs: Yes ?Stairs assistance: Modified independent (Device/Increase time) ?Stair Management: One rail Right, Step to pattern, Forwards, With cane ?Number of Stairs: 5 ?  ? ?Wheelchair Mobility ?  ? ?Modified Rankin (Stroke Patients Only) ?  ? ?  ? ?Balance Overall balance assessment: Mild deficits observed, not formally tested ?  ?  ?  ?  ?  ?  ?  ?  ?  ?  ?  ?  ?  ?  ?  ?  ?  ?  ?   ? ? ? ?Pertinent Vitals/Pain Pain Assessment ?Pain Assessment: Faces ?Faces Pain Scale: Hurts little more ?Pain Location: back ?Pain Descriptors / Indicators: Discomfort, Grimacing ?Pain Intervention(s): Monitored during session, Repositioned  ? ? ?Home Living Family/patient expects to be discharged to:: Private residence ?Living Arrangements: Children ?Available Help at Discharge: Family ?Type of Home: House ?Home Access: Stairs to enter ?Entrance Stairs-Rails: Right ?Entrance Stairs-Number of Steps: 5 ?  ?Home Layout: One level ?Home Equipment: Kasandra Knudsen - single point ?   ?  ?Prior Function Prior Level of Function : Independent/Modified Independent;Working/employed;Driving ?  ?  ?  ?  ?  ?  ?Mobility Comments: has been using cane since January due to L LE pain ?  ?  ? ? ?Hand Dominance  ?   ? ?  ?Extremity/Trunk  Assessment  ? Upper Extremity Assessment ?Upper Extremity Assessment: Defer to OT evaluation ?  ? ?Lower Extremity Assessment ?Lower Extremity Assessment: Generalized weakness ?  ? ?Cervical / Trunk Assessment ?Cervical / Trunk Assessment: Back Surgery  ?Communication  ? Communication: No difficulties  ?Cognition Arousal/Alertness:  Awake/alert ?Behavior During Therapy: [facility] for tasks assessed/performed ?Overall Cognitive Status: Within Functional Limits for tasks assessed ?  ?  ?  ?  ?  ?  ?  ?  ?  ?  ?  ?  ?  ?  ?  ?  ?  ?  ?  ? ?  ?General Comments   ? ?  ?Exercises    ? ?Assessment/Plan  ?  ?PT Assessment Patient does not need any further PT services  ?PT Problem List   ? ?   ?  ?PT Treatment Interventions     ? ?PT Goals (Current goals can be found in the Care Plan section)  ?Acute Rehab PT Goals ?Patient Stated Goal: to go home ?PT Goal Formulation: All assessment and education complete, DC therapy ? ?  ?Frequency   ?  ? ? ?Co-evaluation   ?  ?  ?  ?  ? ? ?  ?AM-PAC PT "6 Clicks" Mobility  ?Outcome Measure Help needed turning from your back to your side while in a flat bed without using bedrails?: None ?Help needed moving from lying on your back to sitting on the side of a flat bed without using bedrails?: None ?Help needed moving to and from a bed to a chair (including a wheelchair)?: None ?Help needed standing up from a chair using your arms (e.g., wheelchair or bedside chair)?: None ?Help needed to walk in hospital room?: None ?Help needed climbing 3-5 steps with a railing? : None ?6 Click Score: 24 ? ?  ?End of Session Equipment Utilized During Treatment: Back brace ?Activity Tolerance: Patient tolerated treatment well ?Patient left: in bed;with call bell/phone within reach ?Nurse Communication: Mobility status ?PT Visit Diagnosis: Muscle weakness (generalized) (M62.81) ?  ? ?Time: [phone] ?PT Time Calculation (min) (ACUTE ONLY): 24 min ? ? ?Charges:   PT Evaluation ?$PT Eval Low Complexity: 1 Low ?PT Treatments ?$Therapeutic Activity: 8-22 mins ?  ?   ? ? ?Abigaelle Verley A. Gilford Rile, PT, DPT ?Acute Rehabilitation Services ?Pager [phone] ?Office [phone] ? ? ?Shaine Newmark A Kalaya Infantino ?12/20/2021, 8:38 AM ? ?

## 2021-12-20 NOTE — Evaluation (Signed)
Occupational Therapy Evaluation ?Patient Details ?Name: Kimberly Cowan ?MRN: [id number] ?DOB: 24-Dec-1965 ?Today's Date: 12/20/2021 ? ? ?History of Present Illness 56 y/o female admitted on 12/19/21 following PLIF L4-5. PMH: hx of PE & DVT, depression, sleep apnea, hx of back surgery  ? ?Clinical Impression ?  ?Kimberly Cowan was evaluated s/p the above back surgery, she is mod I at baseline with use of SPC. She plans to d/c to her daughters house with 24/7 supervision, 1 level and a tub shower. After review of back precautions and compensatory techniques, pt was able to complete all ADLs and functional mobility with SPC and min G for safety. Recommend pt use shower chair/3in1 for safety. Pt does not have further acute/post-acute OT, recommend d/c to home.  ?   ? ?Recommendations for follow up therapy are one component of a multi-disciplinary discharge planning process, led by the attending physician.  Recommendations may be updated based on patient status, additional functional criteria and insurance authorization.  ? ?Follow Up Recommendations ? No OT follow up  ?  ?Assistance Recommended at Discharge Intermittent Supervision/Assistance  ?Patient can return home with the following A little help with walking and/or transfers;A little help with bathing/dressing/bathroom;Assistance with cooking/housework;Help with stairs or ramp for entrance;Assist for transportation ? ?  ?Functional Status Assessment ? Patient has had a recent decline in their functional status and demonstrates the ability to make significant improvements in function in a reasonable and predictable amount of time.  ?Equipment Recommendations ? BSC/3in1  ?  ?   ?Precautions / Restrictions Precautions ?Precautions: Back ?Precaution Booklet Issued: Yes (comment) ?Required Braces or Orthoses: Spinal Brace ?Spinal Brace: Lumbar corset;Applied in standing position ?Restrictions ?Weight Bearing Restrictions: No  ? ?  ? ?Mobility Bed Mobility ?Overal bed  mobility: Modified Independent ?  ?  ?  ?  ?  ?  ?General bed mobility comments: increased time and effort ?  ? ?Transfers ?Overall transfer level: Modified independent ?Equipment used: Straight cane ?  ?  ?  ?  ?  ?  ?  ?General transfer comment: increased time and effort to stand but no need for physical assist ?  ? ?  ?Balance Overall balance assessment: Mild deficits observed, not formally tested ?  ?  ?  ?  ?  ?  ?  ?  ?  ?  ?  ?  ?  ?  ?  ?  ?  ?  ?   ? ?ADL either performed or assessed with clinical judgement  ? ?ADL Overall ADL's : Needs assistance/impaired ?  ?  ?  ?  ?  ?  ?  ?  ?  ?  ?  ?  ?  ?  ?  ?  ?  ?  ?Functional mobility during ADLs: Supervision/safety;Cane ?General ADL Comments: After general review of back precautions and compensatory techniques, pt demonstrated min G ability to complete ADLs and functional mobility with SPC  ? ? ? ?Vision Baseline Vision/History: 0 No visual deficits;1 Wears glasses ?Ability to See in Adequate Light: 0 Adequate ?Patient Visual Report: No change from baseline ?Vision Assessment?: No apparent visual deficits  ?   ?   ?   ? ?Pertinent Vitals/Pain Pain Assessment ?Pain Assessment: Faces ?Faces Pain Scale: Hurts little more ?Pain Location: back ?Pain Descriptors / Indicators: Discomfort, Grimacing ?Pain Intervention(s): Limited activity within patient's tolerance, Monitored during session  ? ? ? ?Hand Dominance   ?  ?Extremity/Trunk Assessment Upper Extremity Assessment ?Upper Extremity Assessment: [facility]  for tasks assessed ?  ?Lower Extremity Assessment ?Lower Extremity Assessment: Defer to PT evaluation ?  ?Cervical / Trunk Assessment ?Cervical / Trunk Assessment: Back Surgery ?  ?Communication Communication ?Communication: No difficulties ?  ?Cognition Arousal/Alertness: Awake/alert ?Behavior During Therapy: [facility] for tasks assessed/performed ?Overall Cognitive Status: Within Functional Limits for tasks assessed ?  ?  ?  ?  ?  ?  ?  ?General Comments: great  understanding of back precautions ?  ?  ?General Comments  VSS on RA ? ?  ?   ?   ? ? ?Home Living Family/patient expects to be discharged to:: Private residence ?Living Arrangements: Children ?Available Help at Discharge: Family ?Type of Home: House ?Home Access: Stairs to enter ?Entrance Stairs-Number of Steps: 5 ?Entrance Stairs-Rails: Right ?Home Layout: One level ?  ?  ?Bathroom Shower/Tub: Tub/shower unit ?  ?Bathroom Toilet: Standard ?  ?  ?Home Equipment: Kasandra Knudsen - single point ?  ?  ?  ? ?  ?Prior Functioning/Environment Prior Level of Function : Independent/Modified Independent;Working/employed;Driving ?  ?  ?  ?  ?  ?  ?Mobility Comments: has been using cane since January due to L LE pain ?ADLs Comments: indep, works from home ?  ? ?  ?  ?OT Problem List: Decreased strength;Decreased range of motion;Decreased activity tolerance;Impaired balance (sitting and/or standing);Decreased safety awareness;Decreased knowledge of use of DME or AE;Decreased knowledge of precautions ?  ?   ?OT Treatment/Interventions:    ?  ?OT Goals(Current goals can be found in the care plan section) Acute Rehab OT Goals ?Patient Stated Goal: home ?OT Goal Formulation: With patient ?Time For Goal Achievement: 01/03/22 ?Potential to Achieve Goals: Good  ?OT Frequency:   ?  ? ?   ?AM-PAC OT "6 Clicks" Daily Activity     ?Outcome Measure Help from another person eating meals?: None ?Help from another person taking care of personal grooming?: A Little ?Help from another person toileting, which includes using toliet, bedpan, or urinal?: A Little ?Help from another person bathing (including washing, rinsing, drying)?: A Little ?Help from another person to put on and taking off regular upper body clothing?: None ?Help from another person to put on and taking off regular lower body clothing?: A Little ?6 Click Score: 20 ?  ?End of Session Equipment Utilized During Treatment: Back brace [facility]) ?Nurse Communication: Mobility status ? ?Activity  Tolerance: Patient tolerated treatment well ?Patient left: in bed;with call bell/phone within reach ? ?OT Visit Diagnosis: Unsteadiness on feet (R26.81);Other abnormalities of gait and mobility (R26.89);Muscle weakness (generalized) (M62.81);Pain  ?              ?Time: 6734-1937 ?OT Time Calculation (min): 19 min ?Charges:  OT General Charges ?$OT Visit: 1 Visit ?OT Evaluation ?$OT Eval Low Complexity: 1 Low ? ? ?Shervon Kerwin A Jerrel Tiberio ?12/20/2021, 9:03 AM ?

## 2021-12-20 NOTE — Discharge Instructions (Addendum)
Wound Care ?Keep incision covered and dry for three days.  ?Do not put any creams, lotions, or ointments on incision. ?Leave steri-strips on back.  They will fall off by themselves. ?Activity ?Walk each and every day, increasing distance each day. ?No lifting greater than 8 lbs.  Avoid excessive neck motion. ?No driving for 2 weeks; may ride as a passenger locally. ?If provided with back brace, wear when out of bed.  It is not necessary to wear brace in bed. ?Diet ?Resume your normal diet.  ? ?Call Your Doctor If Any of These Occur ?Redness, drainage, or swelling at the wound.  ?Temperature greater than 101 degrees. ?Severe pain not relieved by pain medication. ?Incision starts to come apart. ?Follow Up Appt ?Call  [phone]) for problems.   ?

## 2021-12-22 MED FILL — Heparin Sodium (Porcine) Inj 1000 Unit/ML: INTRAMUSCULAR | Qty: 30 | Status: AC

## 2021-12-22 MED FILL — Sodium Chloride IV Soln 0.9%: INTRAVENOUS | Qty: 1000 | Status: AC

## 2021-12-30 DIAGNOSIS — M5416 Radiculopathy, lumbar region: Secondary | ICD-10-CM | POA: Diagnosis not present

## 2021-12-30 DIAGNOSIS — M961 Postlaminectomy syndrome, not elsewhere classified: Secondary | ICD-10-CM | POA: Diagnosis not present

## 2021-12-30 DIAGNOSIS — Z79899 Other long term (current) drug therapy: Secondary | ICD-10-CM | POA: Diagnosis not present

## 2021-12-30 DIAGNOSIS — M48061 Spinal stenosis, lumbar region without neurogenic claudication: Secondary | ICD-10-CM | POA: Diagnosis not present

## 2021-12-30 DIAGNOSIS — G4733 Obstructive sleep apnea (adult) (pediatric): Secondary | ICD-10-CM | POA: Diagnosis not present

## 2022-01-02 ENCOUNTER — Ambulatory Visit
Admission: RE | Admit: 2022-01-02 | Discharge: 2022-01-02 | Disposition: A | Discharge status: Home / Self Care | Payer: BC Managed Care – PPO | Source: Ambulatory Visit | Attending: Internal Medicine | Admitting: Internal Medicine

## 2022-01-02 DIAGNOSIS — Z1231 Encounter for screening mammogram for malignant neoplasm of breast: Secondary | ICD-10-CM | POA: Diagnosis present

## 2022-01-05 ENCOUNTER — Telehealth: Payer: Self-pay

## 2022-01-05 NOTE — Telephone Encounter (Signed)
Completed PA on [email or web address] for Belsomra 20 mg daily. ? ?Key: [id number]  ?PA Case ID: [id number] ?Rx #: [id number] ?

## 2022-01-07 ENCOUNTER — Telehealth: Payer: Self-pay

## 2022-01-07 ENCOUNTER — Other Ambulatory Visit: Payer: Self-pay | Admitting: Internal Medicine

## 2022-01-07 DIAGNOSIS — F5101 Primary insomnia: Secondary | ICD-10-CM

## 2022-01-07 MED ORDER — TEMAZEPAM 15 MG PO CAPS: 15.0000 mg | ORAL_CAPSULE | Freq: Every evening | ORAL | 0 refills | Status: DC | PRN

## 2022-01-07 NOTE — Telephone Encounter (Signed)
Sent patient reply to Dr Army Melia. She will try either medication. ?

## 2022-01-07 NOTE — Telephone Encounter (Signed)
PA completed waiting on insurance approval. ? ?Key: [id number] ? ?KP ?

## 2022-01-07 NOTE — Telephone Encounter (Signed)
PA was Denied for [employer]. Patient needs to try and fail Doxepin, Eszopiclone, Temazepam, Zaleplon, Zolpidem, or Zolpidem ER. ? ?Please advise. Sent the pharmacy Denial info. ?

## 2022-01-07 NOTE — Telephone Encounter (Signed)
Denied by insurance.  KP 

## 2022-01-07 NOTE — Telephone Encounter (Signed)
PA was Denied. Patient needs to try and fail Doxepin, Eszopiclone, Temazepam, Zaleplon, Zolpidem, or Zolpidem ER. ?

## 2022-01-07 NOTE — Telephone Encounter (Signed)
Sent patient mychart message  Waiting for response

## 2022-01-22 ENCOUNTER — Other Ambulatory Visit: Payer: Self-pay | Admitting: Internal Medicine

## 2022-01-22 DIAGNOSIS — R600 Localized edema: Secondary | ICD-10-CM

## 2022-01-23 ENCOUNTER — Other Ambulatory Visit: Payer: Self-pay

## 2022-01-23 ENCOUNTER — Encounter: Payer: Self-pay | Admitting: Internal Medicine

## 2022-01-23 ENCOUNTER — Other Ambulatory Visit: Payer: Self-pay | Admitting: Internal Medicine

## 2022-01-23 DIAGNOSIS — R600 Localized edema: Secondary | ICD-10-CM

## 2022-01-23 MED ORDER — FUROSEMIDE 20 MG PO TABS: 20.0000 mg | ORAL_TABLET | Freq: Every day | ORAL | 0 refills | Status: DC

## 2022-01-23 NOTE — Telephone Encounter (Signed)
Requested medication (s) are due for refill today: yes ? ?Requested medication (s) are on the active medication list: yes ? ?Last refill:  12/07/21 #60  ? ?Future visit scheduled: yes ? ?Notes to clinic:  overdue Magnesium and low K ? ? ?Requested Prescriptions  ?Pending Prescriptions Disp Refills  ? furosemide (LASIX) 20 MG tablet [Pharmacy Med Name: FUROSEMIDE '20MG'$  TABLETS] 30 tablet   ?  Sig: TAKE 1/2 TABLET(10 MG) BY MOUTH DAILY AS NEEDED  ?  ? Cardiovascular:  Diuretics - Loop Failed - 01/22/2022  3:31 PM  ?  ?  Failed - K in normal range and within 180 days  ?  Potassium  ?Date Value Ref Range Status  ?12/16/2021 3.2 (L) 3.5 - 5.1 mmol/L Final  ?10/31/2014 3.8 3.5 - 5.1 mmol/L Final  ?  ?  ?  ?  Failed - Mg Level in normal range and within 180 days  ?  Magnesium  ?Date Value Ref Range Status  ?03/03/2021 1.9 1.7 - 2.4 mg/dL Final  ?  Comment:  ?  Performed at [facility], [address] [place], [place] [address]  ?  ?  ?  ?  Passed - Ca in normal range and within 180 days  ?  Calcium  ?Date Value Ref Range Status  ?12/16/2021 9.6 8.9 - 10.3 mg/dL Final  ? ?Calcium, Total  ?Date Value Ref Range Status  ?10/31/2014 9.1 8.5 - 10.1 mg/dL Final  ?  ?  ?  ?  Passed - Na in normal range and within 180 days  ?  Sodium  ?Date Value Ref Range Status  ?12/16/2021 143 135 - 145 mmol/L Final  ?10/02/2021 145 (H) 134 - 144 mmol/L Final  ?10/31/2014 138 136 - 145 mmol/L Final  ?  ?  ?  ?  Passed - Cr in normal range and within 180 days  ?  Creatinine  ?Date Value Ref Range Status  ?10/31/2014 0.69 0.60 - 1.30 mg/dL Final  ? ?Creatinine, Ser  ?Date Value Ref Range Status  ?12/16/2021 0.62 0.44 - 1.00 mg/dL Final  ?  ?  ?  ?  Passed - Cl in normal range and within 180 days  ?  Chloride  ?Date Value Ref Range Status  ?12/16/2021 107 98 - 111 mmol/L Final  ?10/31/2014 104 98 - 107 mmol/L Final  ?  ?  ?  ?  Passed - Last BP in normal range  ?  BP Readings from Last 1 Encounters:  ?12/20/21 119/67  ?  ?  ?  ?  Passed -  Valid encounter within last 6 months  ?  Recent Outpatient Visits   ? ?      ? 3 months ago Diarrhea of presumed infectious origin  ? [facility] Glean [facility], MD  ? 3 months ago Annual physical exam  ? [facility] Glean Hess, MD  ? 1 year ago Prediabetes  ? [facility] Glean Hess, MD  ? 1 year ago Prediabetes  ? [facility] Glean Hess, MD  ? 1 year ago Acute non-recurrent maxillary sinusitis  ? [facility] Glean Hess, MD  ? ?  ?  ?Future Appointments   ? ?        ? In 8 months Army Melia Jesse Sans, MD [facility], [place]  ? ?  ? ? ?  ?  ?  ? ? ? ? ?

## 2022-01-27 DIAGNOSIS — M48061 Spinal stenosis, lumbar region without neurogenic claudication: Secondary | ICD-10-CM | POA: Diagnosis not present

## 2022-01-31 ENCOUNTER — Other Ambulatory Visit: Payer: Self-pay | Admitting: Internal Medicine

## 2022-01-31 DIAGNOSIS — F5101 Primary insomnia: Secondary | ICD-10-CM

## 2022-02-02 NOTE — Telephone Encounter (Signed)
Please review. Last office visit 10/24/2021.  KP

## 2022-02-02 NOTE — Telephone Encounter (Signed)
Requested medication (s) are due for refill today: yes ? ?Requested medication (s) are on the active medication list: yes ? ?Last refill:  01/07/22 #30/0 ? ?Future visit scheduled: yes ? ?Notes to clinic:  Unable to refill per protocol, cannot delegate. ?  ?Requested Prescriptions  ?Pending Prescriptions Disp Refills  ? temazepam (RESTORIL) 15 MG capsule [Pharmacy Med Name: TEMAZEPAM '15MG'$  CAPSULES] 30 capsule   ?  Sig: TAKE 1 CAPSULE(15 MG) BY MOUTH AT BEDTIME AS NEEDED FOR SLEEP  ?  ? Not Delegated - Psychiatry: Anxiolytics/Hypnotics 2 Failed - 01/31/2022  8:07 PM  ?  ?  Failed - This refill cannot be delegated  ?  ?  Failed - Urine Drug Screen completed in last 360 days  ?  ?  Passed - Patient is not pregnant  ?  ?  Passed - Valid encounter within last 6 months  ?  Recent Outpatient Visits   ? ?      ? 3 months ago Diarrhea of presumed infectious origin  ? [facility] Glean Hess, MD  ? 4 months ago Annual physical exam  ? [facility] Glean Hess, MD  ? 1 year ago Prediabetes  ? [facility] Glean Hess, MD  ? 1 year ago Prediabetes  ? [facility] Glean Hess, MD  ? 1 year ago Acute non-recurrent maxillary sinusitis  ? [facility] Glean Hess, MD  ? ?  ?  ?Future Appointments   ? ?        ? In 8 months Army Melia Jesse Sans, MD [facility], [place]  ? ?  ? ? ?  ?  ?  ? ?

## 2022-02-19 DIAGNOSIS — R7303 Prediabetes: Secondary | ICD-10-CM | POA: Diagnosis not present

## 2022-02-19 DIAGNOSIS — K909 Intestinal malabsorption, unspecified: Secondary | ICD-10-CM | POA: Diagnosis not present

## 2022-02-19 DIAGNOSIS — G4733 Obstructive sleep apnea (adult) (pediatric): Secondary | ICD-10-CM | POA: Diagnosis not present

## 2022-02-19 DIAGNOSIS — Z9989 Dependence on other enabling machines and devices: Secondary | ICD-10-CM | POA: Diagnosis not present

## 2022-02-19 DIAGNOSIS — Z9884 Bariatric surgery status: Secondary | ICD-10-CM | POA: Diagnosis not present

## 2022-02-19 DIAGNOSIS — E611 Iron deficiency: Secondary | ICD-10-CM | POA: Diagnosis not present

## 2022-02-19 DIAGNOSIS — K8681 Exocrine pancreatic insufficiency: Secondary | ICD-10-CM | POA: Diagnosis not present

## 2022-02-19 DIAGNOSIS — E876 Hypokalemia: Secondary | ICD-10-CM | POA: Diagnosis not present

## 2022-02-19 DIAGNOSIS — R197 Diarrhea, unspecified: Secondary | ICD-10-CM | POA: Diagnosis not present

## 2022-02-19 DIAGNOSIS — Z9049 Acquired absence of other specified parts of digestive tract: Secondary | ICD-10-CM | POA: Diagnosis not present

## 2022-02-19 LAB — LIPID PANEL
Cholesterol: 135 (ref 0–200)
HDL: 65 (ref 35–70)
LDL Cholesterol: 55
Triglycerides: 74 (ref 40–160)

## 2022-02-19 LAB — VITAMIN B12: Vitamin B-12: 647

## 2022-02-19 LAB — IRON,TIBC AND FERRITIN PANEL: Ferritin: 11.5

## 2022-02-19 LAB — VITAMIN D 25 HYDROXY (VIT D DEFICIENCY, FRACTURES): Vit D, 25-Hydroxy: 9

## 2022-02-19 LAB — TSH: TSH: 0.89 (ref 0.41–5.90)

## 2022-02-25 DIAGNOSIS — E559 Vitamin D deficiency, unspecified: Secondary | ICD-10-CM | POA: Insufficient documentation

## 2022-02-25 DIAGNOSIS — R7989 Other specified abnormal findings of blood chemistry: Secondary | ICD-10-CM | POA: Insufficient documentation

## 2022-02-25 DIAGNOSIS — E509 Vitamin A deficiency, unspecified: Secondary | ICD-10-CM | POA: Insufficient documentation

## 2022-03-14 ENCOUNTER — Other Ambulatory Visit: Payer: Self-pay | Admitting: Internal Medicine

## 2022-03-14 DIAGNOSIS — E538 Deficiency of other specified B group vitamins: Secondary | ICD-10-CM

## 2022-03-15 ENCOUNTER — Encounter: Payer: Self-pay | Admitting: Emergency Medicine

## 2022-03-15 ENCOUNTER — Ambulatory Visit
Admission: EM | Admit: 2022-03-15 | Discharge: 2022-03-15 | Disposition: A | Discharge status: Home / Self Care | Payer: BC Managed Care – PPO | Attending: Emergency Medicine | Admitting: Emergency Medicine

## 2022-03-15 DIAGNOSIS — L03213 Periorbital cellulitis: Secondary | ICD-10-CM | POA: Diagnosis not present

## 2022-03-15 DIAGNOSIS — H00025 Hordeolum internum left lower eyelid: Secondary | ICD-10-CM | POA: Diagnosis not present

## 2022-03-15 DIAGNOSIS — H53142 Visual discomfort, left eye: Secondary | ICD-10-CM

## 2022-03-15 LAB — BASIC METABOLIC PANEL
BUN: 5 (ref 4–21)
CO2: 29 — AB (ref 13–22)
Chloride: 110 — AB (ref 99–108)
Creatinine: 0.6 (ref 0.5–1.1)
Glucose: 82
Potassium: 3.1 mEq/L — AB (ref 3.5–5.1)
Sodium: 145 (ref 137–147)

## 2022-03-15 LAB — COMPREHENSIVE METABOLIC PANEL
Albumin: 3.4 — AB (ref 3.5–5.0)
Calcium: 9.7 (ref 8.7–10.7)
eGFR: >90

## 2022-03-15 LAB — HEPATIC FUNCTION PANEL
ALT: 20 U/L (ref 7–35)
AST: 29 (ref 13–35)
Alkaline Phosphatase: 99 (ref 25–125)
Bilirubin, Total: 0.5

## 2022-03-15 LAB — CBC AND DIFFERENTIAL
HCT: 36 (ref 36–46)
Hemoglobin: 11.5 — AB (ref 12.0–16.0)
Platelets: 180 10*3/uL (ref 150–400)
WBC: 3.8

## 2022-03-15 LAB — CBC: RBC: 4 (ref 3.87–5.11)

## 2022-03-15 NOTE — ED Triage Notes (Signed)
Patient c/o left eye swelling below her eye that she noticed yesterday after taking a shower.  Patient reports pain and drainage from her left eye.

## 2022-03-15 NOTE — Discharge Instructions (Addendum)
I am concerned that you may have an infection in your eye orbit, also known as postseptal cellulitis, because of the pain with moving your eye, photophobia.  I am also concerned with your headache, blurry vision and photophobia about increased intraocular pressure.  Unfortunately, we do not have the tools here to evaluate these that you do not have an emergency.  Please go to the emergency department.

## 2022-03-15 NOTE — ED Provider Notes (Signed)
HPI  SUBJECTIVE:  Kimberly Cowan is a 56 y.o. female who presents with itching, swelling, stye in the left lower eyelid for the past 5 days.  She reports painful, erythematous, pruritic, tender "puffiness" inferior to her left eye starting last night after being outside.  She has also tried white petroleum/mineral oil lubricating stye ointment.  She tried warm compresses.  There are no alleviating factors.  Symptoms are worse with direct sunlight.  She denies conjunctival injection, but states that she has crusting when she wakes up.  No purulent drainage.  She reports a constant left-sided headache since Tuesday, that is different than her usual migraines.  She reports blurry vision in her left eye and photophobia.  No eye pain, allergy symptoms, foreign body sensation.  No halos around lights, pain worse in the dark, fevers.  No antipyretic in the past 6 hours.  She wears glasses.  She does not wear contacts.  She has a past medical history of chronic back pain and glaucoma.  She did not take her glaucoma drops last night.  No history of diabetes, hypertension, MRSA.  PCP: [facility].  Ophthalmology: [facility] in [place].  Past Medical History:  Diagnosis Date   Allergy 1999   SEASONAL   Anemia    Anxiety    Arm pain, left 05/17/2018   Arthritis    lower spine   Bilateral lumbar radiculopathy    Cellulitis of abdominal wall 03/03/2021   Chronic back pain    Chronic GERD 03/03/2021   Clinical depression 04/04/2012   Depression    DVT (deep venous thrombosis) ([place])    Encounter for screening colonoscopy    Essential (primary) hypertension 06/04/2020   Glaucoma    RIGHT EYE   Headache(784.0)    migraines   Major depressive disorder with single episode, in full remission ([place]) 08/15/2018   Malfunction of jejunostomy tube ([place]) 02/13/2021   Ovarian cyst    Pneumonia    walking pneumonia   Pulmonary embolism ([place]) 05/2017   Provoked, following knee surgery    S/P laparoscopic cholecystectomy 02/07/2021   Sleep apnea    no cpcap     last sleep study > 4 yrs    Past Surgical History:  Procedure Laterality Date   ABDOMINAL HYSTERECTOMY  10/2006   BACK SURGERY  2014   lumbar fusion   BACK SURGERY  2009   discectomy   BARIATRIC SURGERY  07/2020   Revision   [place]  01/2021   COLONOSCOPY WITH PROPOFOL N/A 04/12/2018   Procedure: COLONOSCOPY WITH PROPOFOL;  Surgeon: Lin Landsman, MD;  Location: [facility];  Service: Gastroenterology;  Laterality: N/A;   GASTRIC BYPASS  2005   KNEE ARTHROSCOPY WITH LATERAL MENISECTOMY Left 05/27/2017   Procedure: KNEE ARTHROSCOPY WITH LATERAL MENISECTOMY;  Surgeon: Hessie Knows, MD;  Location: [facility];  Service: Orthopedics;  Laterality: Left;   KNEE ARTHROSCOPY WITH LATERAL RELEASE Left 05/27/2017   Procedure: KNEE ARTHROSCOPY WITH LATERAL RELEASE;  Surgeon: Hessie Knows, MD;  Location: [facility];  Service: Orthopedics;  Laterality: Left;   LAPAROSCOPIC SALPINGO OOPHERECTOMY Bilateral 10/05/2016   Procedure: LAPAROSCOPIC SALPINGO OOPHORECTOMY;  Surgeon: Brayton Mars, MD;  Location: [facility];  Service: Gynecology;  Laterality: Bilateral;   REDUCTION MAMMAPLASTY Bilateral    2001   SPINAL CORD STIMULATOR INSERTION N/A 03/20/2016   Procedure: LUMBAR SPINAL CORD STIMULATOR INSERTION;  Surgeon: Eustace Moore, MD;  [facility];  Service: Neurosurgery;  Laterality: N/A;   [place]  2009 2014 2017   TONSILLECTOMY      Family History  Problem Relation Age of Onset   Diabetes Mother    Hypertension Mother    Obesity Mother    Arthritis Mother    Varicose Veins Mother    Diabetes Sister    Hypertension Sister    Arthritis Sister    Hypertension Father    Arthritis Father    Cancer Father    Obesity Father    Obesity Daughter    Obesity Sister    Arthritis Sister    Obesity Sister    Diabetes Maternal Grandmother    Cancer  Maternal Aunt    Diabetes Maternal Aunt    Breast cancer Neg Hx     Social History   Tobacco Use   Smoking status: Never   Smokeless tobacco: Never   Tobacco comments:    smoking cessation materials not required  Vaping Use   Vaping Use: Never used  Substance Use Topics   Alcohol use: No   Drug use: No     Current Facility-Administered Medications:    cyanocobalamin ((VITAMIN B-12)) injection 1,000 mcg, 1,000 mcg, Intramuscular, Once, Glean Hess, MD  Current Outpatient Medications:    cyanocobalamin (,VITAMIN B-12,) 1000 MCG/ML injection, ADMINISTER 1 ML(1000 MCG) IN THE MUSCLE EVERY 30 DAYS (Patient taking differently: Inject 1,000 mcg into the muscle every 30 (thirty) days.), Disp: 1 mL, Rfl: 12   furosemide (LASIX) 20 MG tablet, Take 1 tablet (20 mg total) by mouth daily., Disp: 60 tablet, Rfl: 0   gabapentin (NEURONTIN) 300 MG capsule, Take 600 mg by mouth at bedtime., Disp: , Rfl:    latanoprost (XALATAN) 0.005 % ophthalmic solution, Place 1 drop into both eyes at bedtime. , Disp: , Rfl:    levocetirizine (XYZAL) 5 MG tablet, Take 1 tablet (5 mg total) by mouth every evening., Disp: 90 tablet, Rfl: 2   methocarbamol (ROBAXIN) 500 MG tablet, Take 1 tablet (500 mg total) by mouth every 6 (six) hours as needed for muscle spasms., Disp: 60 tablet, Rfl: 1   Multiple Vitamins-Minerals (MULTIVITAMIN GUMMIES ADULTS PO), Take 1 tablet by mouth daily., Disp: , Rfl:    pantoprazole (PROTONIX) 40 MG tablet, TAKE ONE TABLET BY MOUTH DAILY (Patient taking differently: 40 mg daily.), Disp: 90 tablet, Rfl: 1   rizatriptan (MAXALT-MLT) 10 MG disintegrating tablet, DISSOLVE 1 TABLET BY MOUTH AS NEEDED FOR MIGRAINE- MAY REPEAT IN 2 HOURS AS NEEDED (Patient taking differently: Take 10 mg by mouth daily as needed for migraine. (repeat after 2 hours if needed)), Disp: 10 tablet, Rfl: 0   temazepam (RESTORIL) 15 MG capsule, TAKE 1 CAPSULE(15 MG) BY MOUTH AT BEDTIME AS NEEDED FOR SLEEP, Disp: 30  capsule, Rfl: 2   cyclobenzaprine (FLEXERIL) 10 MG tablet, Take 10 mg by mouth daily as needed for muscle spasms., Disp: , Rfl:    fluticasone (FLONASE) 50 MCG/ACT nasal spray, NEW PRESCRIPTION REQUEST: INSTILL ONE SPRAY IN EACH NOSTRIL EVERY DAY AS NEEDED (Patient taking differently: Place 1 spray into both nostrils daily as needed for allergies.), Disp: 48 mL, Rfl: 3   ketoconazole (NIZORAL) 2 % shampoo, Apply 1 application topically daily as needed (for skin fungus on neck/back)., Disp: 120 mL, Rfl: 1   scopolamine (TRANSDERM-SCOP) 1 MG/3DAYS, Place 1 patch onto the skin every 3 (three) days. As needed for nausea, Disp: , Rfl:   Allergies  Allergen Reactions   Morphine And  Related Hives, Shortness Of Breath, Nausea And Vomiting and Other (See Comments)    "can't breathe"   Penicillins Anaphylaxis, Hives, Shortness Of Breath and Nausea And Vomiting    "can't breathe", Has patient had a PCN reaction causing immediate rash, facial/tongue/throat swelling, SOB or lightheadedness with hypotension: Yes Has patient had a PCN reaction causing severe rash involving mucus membranes or skin necrosis: No Has patient had a PCN reaction that required hospitalization No Has patient had a PCN reaction occurring within the last 10 years: No If all of the above answers are "NO", then may proceed with Cephalosporin use.    Latex Hives   Baclofen Nausea Only    Jittery, anxious     ROS  As noted in HPI.   Physical Exam  BP (!) 149/83 (BP Location: Left Arm)   Pulse 62   Temp 98.4 F (36.9 C) (Oral)   Resp 14   Ht '5\' 7"'$  (1.702 m)   Wt 107 kg   SpO2 99%   BMI 36.96 kg/m   Constitutional: Well developed, well nourished, no acute distress Eyes:  EOMI, conjunctiva normal bilaterally PERRLA.  left eye: Cornea appears clear.  Positive pain with extraocular movements to the left and down.  Positive inner lid stye of the lateral lower lid.  Positive tender edema, mild erythema inferior to the eye.   Positive direct photophobia.  No consensual photophobia.  No appreciable discharge.  No hyphema.  No corneal abrasion seen on flourescin exam   Visual Acuity  Right Eye Distance: 20/30 corrected Left Eye Distance: 20/30 corrected Bilateral Distance: 20/40 corrected  Right Eye Near:   Left Eye Near:    Bilateral Near:          HENT: Normocephalic, atraumatic,mucus membranes moist Respiratory: Normal inspiratory effort Cardiovascular: Normal rate GI: nondistended skin: No rash, skin intact Musculoskeletal: no deformities Neurologic: Alert & oriented x 3, no focal neuro deficits Psychiatric: Speech and behavior appropriate   ED Course   Medications - No data to display  No orders of the defined types were placed in this encounter.   No results found for this or any previous visit (from the past 24 hour(s)). No results found.  ED Clinical Impression  1. Preseptal cellulitis of left eye   2. Photophobia of left eye   3. Hordeolum internum of left lower eyelid      ED Assessment/Plan  Patient does have tender erythema, edema inferior to the left eye and 2 styes lateral lower eyelid.  This could be a simple preseptal cellulitis however, she has photophobia and pain with extraocular movements.  Concern for postseptal cellulitis.  Also concern for increased intraocular pressure because of the photophobia, headache and history of glaucoma although she has no other signs or symptoms of acute closed angle glaucoma.  Transferring to the ED for further evaluation.  She is stable to go by by private vehicle.  Discussed rationale for transfer to the emergency department with patient.  She agrees to go.  No orders of the defined types were placed in this encounter.     *This clinic note was created using Dragon dictation software. Therefore, there may be occasional mistakes despite careful proofreading.  ?    Melynda Ripple, MD 03/15/22 [phone]

## 2022-03-15 NOTE — ED Notes (Signed)
Patient is being discharged from the Urgent Care and sent to the [facility] Emergency Department via private vehicle . Per Dr. Alphonzo Cruise, patient is in need of higher level of care due to eye infection. Patient is aware and verbalizes understanding of plan of care.  Vitals:   03/15/22 0846  BP: (!) 149/83  Pulse: 62  Resp: 14  Temp: 98.4 F (36.9 C)  SpO2: 99%

## 2022-03-16 NOTE — Telephone Encounter (Signed)
Requested Prescriptions  Pending Prescriptions Disp Refills  . cyanocobalamin (,VITAMIN B-12,) 1000 MCG/ML injection [Pharmacy Med Name: CYANOCOBALAMIN 1000MCG/ML INJ 1ML] 1 mL 12    Sig: ADMINISTER 1 ML(1000 MCG) IN THE MUSCLE EVERY 30 DAYS     Endocrinology:  Vitamins - Vitamin B12 Failed - 03/14/2022  6:30 AM      Failed - B12 Level in normal range and within 360 days    Vitamin B-12  Date Value Ref Range Status  10/02/2021 1,286 (H) 232 - 1,245 pg/mL Final         Passed - HCT in normal range and within 360 days    HCT  Date Value Ref Range Status  12/16/2021 41.6 36.0 - 46.0 % Final   Hematocrit  Date Value Ref Range Status  10/02/2021 37.9 34.0 - 46.6 % Final         Passed - HGB in normal range and within 360 days    Hemoglobin  Date Value Ref Range Status  12/16/2021 13.2 12.0 - 15.0 g/dL Final  10/02/2021 12.2 11.1 - 15.9 g/dL Final         Passed - Valid encounter within last 12 months    Recent Outpatient Visits          4 months ago Diarrhea of presumed infectious origin   [facility] Glean Hess, [facility]   5 months ago Annual physical exam   [facility] Glean Hess, MD   1 year ago Prediabetes   [facility] Glean [facility], [facility]   1 year ago Prediabetes   [facility] Glean Hess, MD   1 year ago Acute non-recurrent maxillary sinusitis   [facility] Glean Hess, MD      Future Appointments            In 6 months Army Melia Jesse Sans, MD [facility], [facility]

## 2022-03-24 DIAGNOSIS — M48061 Spinal stenosis, lumbar region without neurogenic claudication: Secondary | ICD-10-CM | POA: Diagnosis not present

## 2022-03-24 DIAGNOSIS — H40153 Residual stage of open-angle glaucoma, bilateral: Secondary | ICD-10-CM | POA: Diagnosis not present

## 2022-04-07 ENCOUNTER — Other Ambulatory Visit: Payer: Self-pay | Admitting: Internal Medicine

## 2022-04-07 DIAGNOSIS — G43909 Migraine, unspecified, not intractable, without status migrainosus: Secondary | ICD-10-CM

## 2022-04-07 DIAGNOSIS — R600 Localized edema: Secondary | ICD-10-CM

## 2022-04-07 MED ORDER — RIZATRIPTAN BENZOATE 10 MG PO TBDP: ORAL_TABLET | ORAL | 0 refills | Status: DC

## 2022-04-08 ENCOUNTER — Other Ambulatory Visit: Payer: Self-pay | Admitting: Internal Medicine

## 2022-04-08 ENCOUNTER — Encounter: Payer: Self-pay | Admitting: Internal Medicine

## 2022-04-08 DIAGNOSIS — R600 Localized edema: Secondary | ICD-10-CM

## 2022-04-08 MED ORDER — FUROSEMIDE 20 MG PO TABS: 20.0000 mg | ORAL_TABLET | Freq: Every day | ORAL | 0 refills | Status: DC

## 2022-04-08 NOTE — Telephone Encounter (Signed)
Requested medication (s) are due for refill today - yes  Requested medication (s) are on the active medication list -yes  Future visit scheduled -yes  Last refill: 01/23/22 #60  Notes to clinic: Last OV note states not recommended for daily use- sig states daly- sent for review   Requested Prescriptions  Pending Prescriptions Disp Refills   furosemide (LASIX) 20 MG tablet [Pharmacy Med Name: FUROSEMIDE '20MG'$  TABLETS] 60 tablet 0    Sig: TAKE 1 TABLET(20 MG) BY MOUTH DAILY     Cardiovascular:  Diuretics - Loop Failed - 04/07/2022  2:11 PM      Failed - K in normal range and within 180 days    Potassium  Date Value Ref Range Status  12/16/2021 3.2 (L) 3.5 - 5.1 mmol/L Final  10/31/2014 3.8 3.5 - 5.1 mmol/L Final         Failed - Mg Level in normal range and within 180 days    Magnesium  Date Value Ref Range Status  03/03/2021 1.9 1.7 - 2.4 mg/dL Final    Comment:    Performed at [facility], [address] [place], [place] [address]         Failed - Last BP in normal range    BP Readings from Last 1 Encounters:  03/15/22 (!) 149/83         Passed - Ca in normal range and within 180 days    Calcium  Date Value Ref Range Status  12/16/2021 9.6 8.9 - 10.3 mg/dL Final   Calcium, Total  Date Value Ref Range Status  10/31/2014 9.1 8.5 - 10.1 mg/dL Final         Passed - Na in normal range and within 180 days    Sodium  Date Value Ref Range Status  12/16/2021 143 135 - 145 mmol/L Final  10/02/2021 145 (H) 134 - 144 mmol/L Final  10/31/2014 138 136 - 145 mmol/L Final         Passed - Cr in normal range and within 180 days    Creatinine  Date Value Ref Range Status  10/31/2014 0.69 0.60 - 1.30 mg/dL Final   Creatinine, Ser  Date Value Ref Range Status  12/16/2021 0.62 0.44 - 1.00 mg/dL Final         Passed - Cl in normal range and within 180 days    Chloride  Date Value Ref Range Status  12/16/2021 107 98 - 111 mmol/L Final  10/31/2014 104 98 - 107  mmol/L Final         Passed - Valid encounter within last 6 months    Recent Outpatient Visits           5 months ago Diarrhea of presumed infectious origin   [facility] Glean Hess, MD   6 months ago Annual physical exam   [facility] Glean Hess, MD   1 year ago Prediabetes   [facility] Glean Hess, MD   1 year ago Prediabetes   [facility] Glean Hess, MD   1 year ago Acute non-recurrent maxillary sinusitis   [facility] Glean Hess, MD       Future Appointments             In 6 months Army Melia Jesse Sans, MD [facility], [place] Prescriptions  Pending Prescriptions Disp Refills  furosemide (LASIX) 20 MG tablet [Pharmacy Med Name: FUROSEMIDE '20MG'$  TABLETS] 60 tablet 0    Sig: TAKE 1 TABLET(20 MG) BY MOUTH DAILY     Cardiovascular:  Diuretics - Loop Failed - 04/07/2022  2:11 PM      Failed - K in normal range and within 180 days    Potassium  Date Value Ref Range Status  12/16/2021 3.2 (L) 3.5 - 5.1 mmol/L Final  10/31/2014 3.8 3.5 - 5.1 mmol/L Final         Failed - Mg Level in normal range and within 180 days    Magnesium  Date Value Ref Range Status  03/03/2021 1.9 1.7 - 2.4 mg/dL Final    Comment:    Performed at [facility], [address] [place], [place] [address]         Failed - Last BP in normal range    BP Readings from Last 1 Encounters:  03/15/22 (!) 149/83         Passed - Ca in normal range and within 180 days    Calcium  Date Value Ref Range Status  12/16/2021 9.6 8.9 - 10.3 mg/dL Final   Calcium, Total  Date Value Ref Range Status  10/31/2014 9.1 8.5 - 10.1 mg/dL Final         Passed - Na in normal range and within 180 days    Sodium  Date Value Ref Range Status  12/16/2021 143 135 - 145 mmol/L Final  10/02/2021 145 (H) 134 - 144 mmol/L Final  10/31/2014 138 136 - 145 mmol/L Final         Passed - Cr  in normal range and within 180 days    Creatinine  Date Value Ref Range Status  10/31/2014 0.69 0.60 - 1.30 mg/dL Final   Creatinine, Ser  Date Value Ref Range Status  12/16/2021 0.62 0.44 - 1.00 mg/dL Final         Passed - Cl in normal range and within 180 days    Chloride  Date Value Ref Range Status  12/16/2021 107 98 - 111 mmol/L Final  10/31/2014 104 98 - 107 mmol/L Final         Passed - Valid encounter within last 6 months    Recent Outpatient Visits           5 months ago Diarrhea of presumed infectious origin   [facility] Glean Hess, MD   6 months ago Annual physical exam   [facility] Glean Hess, MD   1 year ago Prediabetes   [facility] Glean Hess, MD   1 year ago Prediabetes   [facility] Glean Hess, MD   1 year ago Acute non-recurrent maxillary sinusitis   [facility] Glean Hess, MD       Future Appointments             In 6 months Army Melia Jesse Sans, MD [facility], [facility]

## 2022-04-08 NOTE — Telephone Encounter (Signed)
Duplicate

## 2022-04-09 NOTE — Telephone Encounter (Signed)
Requested medication (s) are due for refill today:   Yes  Requested medication (s) are on the active medication list:   Yes  Future visit scheduled:   Yes   Last ordered: 04/17/2021 48 ml, 3 refills  Returned because a new rx is needed.   Requested Prescriptions  Pending Prescriptions Disp Refills   fluticasone (FLONASE) 50 MCG/ACT nasal spray [Pharmacy Med Name: FLUTICASONE 50MCG NASAL SP (120) RX] 16 g     Sig: SHAKE LIQUID AND USE 2 SPRAYS IN EACH NOSTRIL DAILY     Ear, Nose, and Throat: Nasal Preparations - Corticosteroids Passed - 04/08/2022  7:20 PM      Passed - Valid encounter within last 12 months    Recent Outpatient Visits           5 months ago Diarrhea of presumed infectious origin   [facility] Glean Hess, MD   6 months ago Annual physical exam   [facility] Glean Hess, MD   1 year ago Prediabetes   [facility] Glean Hess, MD   1 year ago Prediabetes   [facility] Glean Hess, MD   1 year ago Acute non-recurrent maxillary sinusitis   [facility] Glean Hess, MD       Future Appointments             In 5 months Army Melia Jesse Sans, MD [facility], [facility]

## 2022-04-28 ENCOUNTER — Encounter: Payer: Self-pay | Admitting: Student in an Organized Health Care Education/Training Program

## 2022-04-28 ENCOUNTER — Ambulatory Visit
Payer: BC Managed Care – PPO | Attending: Student in an Organized Health Care Education/Training Program | Admitting: Student in an Organized Health Care Education/Training Program

## 2022-04-28 VITALS — BP 169/91 | HR 81 | Temp 97.1°F | Resp 16 | Ht 67.0 in | Wt 236.0 lb

## 2022-04-28 DIAGNOSIS — M961 Postlaminectomy syndrome, not elsewhere classified: Secondary | ICD-10-CM

## 2022-04-28 DIAGNOSIS — G8929 Other chronic pain: Secondary | ICD-10-CM

## 2022-04-28 DIAGNOSIS — Z981 Arthrodesis status: Secondary | ICD-10-CM

## 2022-04-28 DIAGNOSIS — G894 Chronic pain syndrome: Secondary | ICD-10-CM

## 2022-04-28 DIAGNOSIS — M5416 Radiculopathy, lumbar region: Secondary | ICD-10-CM | POA: Diagnosis not present

## 2022-04-28 DIAGNOSIS — Z79891 Long term (current) use of opiate analgesic: Secondary | ICD-10-CM

## 2022-04-28 DIAGNOSIS — Z9689 Presence of other specified functional implants: Secondary | ICD-10-CM | POA: Diagnosis not present

## 2022-04-28 NOTE — Progress Notes (Signed)
Kimberly Cowan  Service Category: E/M  Provider: Gillis Santa, MD  DOB: 1965-10-13  DOS: 04/28/2022  Referring Provider: Earnie Larsson, MD  MRN: [id number]  Setting: Ambulatory outpatient  PCP: Glean Hess, MD  Type: New Patient  Specialty: Interventional Pain Management    Location: Office  Delivery: Face-to-face     Primary Reason(s) for Visit: Encounter for initial evaluation of one or more chronic problems (new to examiner) potentially causing chronic pain, and posing a threat to normal musculoskeletal function. (Level of risk: High) CC: Back Pain  HPI  Kimberly Cowan is a 56 y.o. year old, female patient, who comes for the first time to our practice referred by Earnie Larsson, MD for our initial evaluation of her chronic pain. She has Headache, migraine; Prediabetes; Status post gastric bypass for obesity; Chronic radicular lumbar pain; OSA on CPAP; B12 nutritional deficiency; Vasomotor symptoms due to menopause; Tinea versicolor; Environmental and seasonal allergies; History of pulmonary embolism; Perforated ear drum, left; Mild peripheral edema; Primary insomnia; Status post hysterectomy; Chronic GERD; Failed back surgical syndrome; Spinal stenosis of lumbar region; Chronic depression; Anxiety; Bariatric surgery status; Glaucoma; Degenerative spondylolisthesis; Spinal cord stimulator status [employer], implanted 2017); History of lumbar fusion (L5-S1 PSIF); Encounter for long-term opiate analgesic use; and Chronic pain syndrome on their problem list. Today she comes in for evaluation of her Back Pain  Pain Assessment: Location: Lower Back Radiating: radiates down back and front of legs bilat to toes; pt states pains travels under feet to toes, NOT on tops of feet Onset: More than a month ago Duration: Chronic pain Quality: Aching, Constant, Pressure, Stabbing, Sharp, Shooting, Tingling Severity: 8 /10 (subjective, self-reported pain score)  Effect on ADL: limits daily  activities, "sometimes affects ability to work" Timing: Constant Modifying factors: SCS, laying down, heat BP: (!) 169/91  HR: 81  Onset and Duration:since 2009  Cause of pain: Unknown Severity: NAS-11 at its worse: 10/10, NAS-11 at its best: 7/10, NAS-11 now: 8/10, and NAS-11 on the average: 8/10 Timing: Not influenced by the time of the day Aggravating Factors: Bending, Climbing, Kneeling, Lifiting, Prolonged sitting, Prolonged standing, Squatting, Twisting, Walking uphill, Walking downhill, and Working Alleviating Factors: Hot packs, Lying down, Medications, Resting, TENS, and Warm showers or baths Associated Problems: Day-time cramps, Night-time cramps, Inability to concentrate, Numbness, Spasms, Tingling, Weakness, Pain that wakes patient up, and Pain that does not allow patient to sleep Quality of Pain: Aching, Agonizing, Annoying, Constant, Deep, Feeling of weight, Pressure-like, Pulsating, Sharp, Shooting, Stabbing, and Tingling Previous Examinations or Tests: CT scan, Ct-Myelogram, MRI scan, Nerve block, X-rays, Neurological evaluation, and Neurosurgical evaluation Previous Treatments: Narcotic medications, Pool exercises, and Spinal cord stimulator   Kimberly Cowan is a pleasant 57 year old female with a history of L4-S1 spinal fusion with [employer] spinal cord stimulator in place who is hoping to establish with pain management as her previous pain provider left [place] [facility] and is no longer excepting new patients.  She states that she has done physical therapy in the past tried various medications as well as injections with limited response.  She had her spinal cord stimulator implanted in 2017 with [employer] which was beneficial for her and allowed her to wean her hydrocodone to just once at night.  She states that she is able to manage with 7.5 mg nightly.  She is hoping to have this continued.  She also has restless leg syndrome and obesity.  She also has obstructive  sleep apnea.  Given that she is  on low-dose and has been compliant with previous pain clinic protocol, I will have her obtain baseline urine toxicology screen which is customary for new patients and I will see her back in approximately 7 to 10 days to sign pain contract and take her on for medication management.   Historic Controlled Substance Pharmacotherapy Review  PMP and historical list of controlled substances: Hydrocodone 7.5 mg nightly. Historical Monitoring: The patient  reports no history of drug use. List of prior UDS Testing: No results found for: "MDMA", "COCAINSCRNUR", "PCPSCRNUR", "PCPQUANT", "CANNABQUANT", "THCU", "ETH", "CBDTHCR", "D8THCCBX", "D9THCCBX" Historical Background Evaluation: [place] PMP: PDMP reviewed during this encounter. Review of the past 76-month conducted.             [place] Department of public safety, offender search: (Editor, commissioningInformation) Non-contributory Risk Assessment Profile: Aberrant behavior: None observed or detected today Risk factors for fatal opioid overdose: None identified today Fatal overdose hazard ratio (HR): Calculation deferred Non-fatal overdose hazard ratio (HR): Calculation deferred Risk of opioid abuse or dependence: 0.7-3.0% with doses ? 36 MME/day and 6.1-26% with doses ? 120 MME/day. Substance use disorder (SUD) risk level: See below Personal History of Substance Abuse (SUD-Substance use disorder):  Alcohol: Negative  Illegal Drugs: Negative  Rx Drugs: Negative  ORT Risk Level calculation: Low Risk  Opioid Risk Tool - 04/28/22 1306       Family History of Substance Abuse   Alcohol Positive Female    Illegal Drugs Negative    Rx Drugs Negative      Personal History of Substance Abuse   Alcohol Negative    Illegal Drugs Negative    Rx Drugs Negative      Age   Age between 138-45years  No      Psychological Disease   Psychological Disease Negative    Depression Positive      Total Score   Opioid Risk Tool Scoring 2     Opioid Risk Interpretation Low Risk            ORT Scoring interpretation table:  Score <3 = Low Risk for SUD  Score between 4-7 = Moderate Risk for SUD  Score >8 = High Risk for Opioid Abuse   PHQ-2 Depression Scale:  Total score:    PHQ-2 Scoring interpretation table: (Score and probability of major depressive disorder)  Score 0 = No depression  Score 1 = 15.4% Probability  Score 2 = 21.1% Probability  Score 3 = 38.4% Probability  Score 4 = 45.5% Probability  Score 5 = 56.4% Probability  Score 6 = 78.6% Probability   PHQ-9 Depression Scale:  Total score:    PHQ-9 Scoring interpretation table:  Score 0-4 = No depression  Score 5-9 = Mild depression  Score 10-14 = Moderate depression  Score 15-19 = Moderately severe depression  Score 20-27 = Severe depression (2.4 times higher risk of SUD and 2.89 times higher risk of overuse)   Pharmacologic Plan: As per protocol, I have not taken over any controlled substance management, pending the results of ordered tests and/or consults.            Initial impression: Pending review of available data and ordered tests.  Meds   Current Outpatient Medications:    cyanocobalamin (,VITAMIN B-12,) 1000 MCG/ML injection, ADMINISTER 1 ML(1000 MCG) IN THE MUSCLE EVERY 30 DAYS, Disp: 1 mL, Rfl: 5   cyclobenzaprine (FLEXERIL) 10 MG tablet, Take 10 mg by mouth daily as needed for muscle spasms., Disp: , Rfl:  fluticasone (FLONASE) 50 MCG/ACT nasal spray, Place 1 spray into both nostrils daily as needed for allergies., Disp: 16 g, Rfl: 1   furosemide (LASIX) 20 MG tablet, Take 1 tablet (20 mg total) by mouth daily., Disp: 90 tablet, Rfl: 0   gabapentin (NEURONTIN) 300 MG capsule, Take 600 mg by mouth at bedtime., Disp: , Rfl:    HYDROcodone-acetaminophen (NORCO) 7.5-325 MG tablet, Take 1 tablet by mouth every 6 (six) hours as needed for moderate pain., Disp: , Rfl:    ketoconazole (NIZORAL) 2 % shampoo, Apply 1 application topically daily as  needed (for skin fungus on neck/back)., Disp: 120 mL, Rfl: 1   latanoprost (XALATAN) 0.005 % ophthalmic solution, Place 1 drop into both eyes at bedtime. , Disp: , Rfl:    levocetirizine (XYZAL) 5 MG tablet, Take 1 tablet (5 mg total) by mouth every evening., Disp: 90 tablet, Rfl: 2   Multiple Vitamins-Minerals (MULTIVITAMIN GUMMIES ADULTS PO), Take 1 tablet by mouth daily., Disp: , Rfl:    pantoprazole (PROTONIX) 40 MG tablet, TAKE ONE TABLET BY MOUTH DAILY (Patient taking differently: 40 mg daily.), Disp: 90 tablet, Rfl: 1   rizatriptan (MAXALT-MLT) 10 MG disintegrating tablet, DISSOLVE 1 TABLET BY MOUTH AS NEEDED FOR MIGRAINE- MAY REPEAT IN 2 HOURS AS NEEDED, Disp: 10 tablet, Rfl: 0   scopolamine (TRANSDERM-SCOP) 1 MG/3DAYS, Place 1 patch onto the skin every 3 (three) days. As needed for nausea, Disp: , Rfl:    temazepam (RESTORIL) 15 MG capsule, TAKE 1 CAPSULE(15 MG) BY MOUTH AT BEDTIME AS NEEDED FOR SLEEP, Disp: 30 capsule, Rfl: 2  Current Facility-Administered Medications:    cyanocobalamin ((VITAMIN B-12)) injection 1,000 mcg, 1,000 mcg, Intramuscular, Once, [employer] Melia Jesse Sans, MD  Imaging Review    Narrative CLINICAL DATA:  Neck pain radiating to the left arm. Patient does have carpal tunnel syndrome.  EXAM: CT CERVICAL SPINE WITHOUT CONTRAST  TECHNIQUE: Multidetector CT imaging of the cervical spine was performed without intravenous contrast. Multiplanar CT image reconstructions were also generated.  COMPARISON:  None.  FINDINGS: Alignment: Normal  Skull base and vertebrae: Normal. Detail is limited from C4-T2 because of body habitus.  Soft tissues and spinal canal: Negative  Disc levels:  No abnormality at the foramen magnum or C1-2.  C2-3: Normal interspace.  C3-4: Minimal uncovertebral prominence. No canal or foraminal stenosis.  C4-5: Minimal uncovertebral prominence. No canal or foraminal stenosis.  C5-6: Normal interspace.  C6-7: Normal  interspace.  C7-T1: Limited detail.  No abnormality seen.  T1-2: Limited detail.  No abnormality seen.  Upper chest: Negative  Other: None  IMPRESSION: Limited detail because of body habitus. No advanced or significant finding is identified. Minimal upper cervical uncovertebral prominence but without evidence of compressive narrowing of the canal or foramina.   Electronically Signed By: Nelson Chimes M.D. On: 07/20/2018 12:48 DG Shoulder Left  Narrative CLINICAL DATA:  Pain and swelling  EXAM: LEFT SHOULDER - 2+ VIEW  COMPARISON:  October 31, 2014  FINDINGS: Frontal, oblique, and Y scapular images were obtained. There is no appreciable fracture or dislocation. Joint spaces appear normal. No erosive change or intra-articular calcification. Visualized left lung clear.  IMPRESSION: No fracture or dislocation.  No appreciable arthropathy.   Electronically Signed By: Lowella Grip III M.D. On: 10/10/2018 18:07   Narrative CLINICAL DATA:  Chronic low back pain with acute, severe low back pain radiating into both legs since 12/09/2014. Prior lumbar surgery in 2009 and 2014.  EXAM: MRI LUMBAR SPINE WITHOUT AND  WITH CONTRAST  TECHNIQUE: Multiplanar and multiecho pulse sequences of the lumbar spine were obtained without and with intravenous contrast.  CONTRAST:  65m MULTIHANCE GADOBENATE DIMEGLUMINE 529 MG/ML IV SOLN  COMPARISON:  Lumbar spine CT 03/20/2014 and MRI 12/04/2011  FINDINGS: Vertebral alignment is normal paravertebral body heights are preserved. Sequelae of prior L5-S1 PLIF are are again identified. There is disc desiccation at L4-5 without significant disc space height loss. No significant vertebral marrow edema is identified. The conus medullaris is normal in signal and terminates at L1. Paravertebral soft tissues are unremarkable. Postoperative changes are noted in the subcutaneous soft tissues of the lower.  T11-12: Only imaged  sagittally. Minimal disc bulging without spinal stenosis.  T12-L1:  Only imaged sagittally.  Negative.  L1-2 through L3-4:  Negative.  L4-5:  Minimal disc bulging without stenosis.  L5-S1: Prior laminectomies and posterior and interbody fusion. Small left-sided dorsal epidural fluid collection at the laminectomy site measures 22 x 6 mm. Mild epidural enhancement likely reflects postoperative granulation tissue. The spinal canal is widely patent. No definite neural foraminal stenosis is identified allowing for mild magnetic susceptibility artifact.  IMPRESSION: 1. Prior L5-S1 fusion without evidence of recurrent stenosis. 2. Mild disc degeneration at L4-5 without stenosis.   Electronically Signed By: ALogan BoresOn: 01/07/2015 08:27  CT LUMBAR SPINE W CONTRAST  Narrative CLINICAL DATA:  Previous spinal fusion. Low back and bilateral leg pain, particularly radiating down the left leg to the ankle and foot.  EXAM: LUMBAR MYELOGRAM  FLUOROSCOPY TIME:  1 minute 3 seconds. 301.67 micro gray meter squared  PROCEDURE: After thorough discussion of risks and benefits of the procedure including bleeding, infection, injury to nerves, blood vessels, adjacent structures as well as headache and CSF leak, written and oral informed consent was obtained. Consent was obtained by Dr. MNelson Chimes Time out form was completed.  Patient was positioned prone on the fluoroscopy table. Local anesthesia was provided with 1% lidocaine without epinephrine after prepped and draped in the usual sterile fashion. Puncture was performed at L2-3 using a 3 1/2 inch 22-gauge spinal needle via right paramedian approach. Using a single pass through the dura, the needle was placed within the thecal sac, with return of clear CSF. 15 mL of Isovue M-200 was injected into the thecal sac, with normal opacification of the nerve roots and cauda equina consistent with free flow within the subarachnoid  space.  I personally performed the lumbar puncture and administered the intrathecal contrast. I also personally performed acquisition of the myelogram images.  TECHNIQUE: Contiguous axial images were obtained through the Lumbar spine after the intrathecal infusion of infusion. Coronal and sagittal reconstructions were obtained of the axial image sets.  COMPARISON:  Prior myelogram 04/20/2018  FINDINGS: LUMBAR MYELOGRAM FINDINGS:  No evidence of stenosis or nerve root compression at L3-4 or above.  At L4-5, there is a moderate anterior extradural defect. Mild narrowing of both lateral recesses but no visible neural compression. Standing flexion extension views do not demonstrate a great deal of motion by the patient, and no significant changes appreciated.  CT LUMBAR MYELOGRAM FINDINGS:  T10-11: Bulging of the disc. Bilateral facet osteoarthritis. Narrowing of the canal but no frank cord compression. Bilateral foraminal encroachment.  T11-12: Mild disc bulge. Mild facet osteoarthritis. Bilateral foraminal narrowing.  T12-L1: Normal interspace.  L1-2: Normal interspace.  L2-3: Normal interspace.  L3-4: Mild circumferential bulging of the disc. Mild facet osteoarthritis. No compressive stenosis.  L4-5: Circumferential bulging of the disc. Bilateral facet degeneration  and hypertrophy. Bilateral foraminal encroachment, right worse than left. Either L4 nerve could be affected.  L5-S1: Previous posterior decompression, diskectomy and fusion. Solid union with sufficient patency of the canal and foramina.  Bilateral sacroiliac osteoarthritis could be symptomatic.  IMPRESSION: L5-S1: Solid union with no apparent neural compression this level.  L4-5: Worsening of adjacent segment degenerative disease. Circumferential bulging of the disc. Facet degeneration and hypertrophy. Bilateral foraminal stenosis could compress either L5 nerve.  L3-4: Mild bulging of the disc.  Mild facet osteoarthritis. No likely compressive stenosis.  Bilateral sacroiliac arthropathy which could be painful.   Electronically Signed By: Nelson Chimes M.D. On: 10/28/2021 14:43  Narrative CLINICAL DATA:  56 year old female undergoing posterior lumbar fusion.  EXAM: LUMBAR SPINE - 2-3 VIEW  COMPARISON:  10/28/2021  FINDINGS: Intraoperative radiographs demonstrate interval placement of bilateral L4 pedicular screws. Unchanged appearance of L5-S1 PSIF.  IMPRESSION: Interval placement of bilateral pedicular screws at L4 without complicating features.   Electronically Signed By: Ruthann Cancer M.D. On: 12/19/2021 12:28  DG MYELOGRAPHY LUMBAR INJ LUMBOSACRAL  Narrative CLINICAL DATA:  Previous spinal fusion. Low back and bilateral leg pain, particularly radiating down the left leg to the ankle and foot.  EXAM: LUMBAR MYELOGRAM  FLUOROSCOPY TIME:  1 minute 3 seconds. 301.67 micro gray meter squared  PROCEDURE: After thorough discussion of risks and benefits of the procedure including bleeding, infection, injury to nerves, blood vessels, adjacent structures as well as headache and CSF leak, written and oral informed consent was obtained. Consent was obtained by Dr. Nelson Chimes. Time out form was completed.  Patient was positioned prone on the fluoroscopy table. Local anesthesia was provided with 1% lidocaine without epinephrine after prepped and draped in the usual sterile fashion. Puncture was performed at L2-3 using a 3 1/2 inch 22-gauge spinal needle via right paramedian approach. Using a single pass through the dura, the needle was placed within the thecal sac, with return of clear CSF. 15 mL of Isovue M-200 was injected into the thecal sac, with normal opacification of the nerve roots and cauda equina consistent with free flow within the subarachnoid space.  I personally performed the lumbar puncture and administered the intrathecal contrast. I also  personally performed acquisition of the myelogram images.  TECHNIQUE: Contiguous axial images were obtained through the Lumbar spine after the intrathecal infusion of infusion. Coronal and sagittal reconstructions were obtained of the axial image sets.  COMPARISON:  Prior myelogram 04/20/2018  FINDINGS: LUMBAR MYELOGRAM FINDINGS:  No evidence of stenosis or nerve root compression at L3-4 or above.  At L4-5, there is a moderate anterior extradural defect. Mild narrowing of both lateral recesses but no visible neural compression. Standing flexion extension views do not demonstrate a great deal of motion by the patient, and no significant changes appreciated.  CT LUMBAR MYELOGRAM FINDINGS:  T10-11: Bulging of the disc. Bilateral facet osteoarthritis. Narrowing of the canal but no frank cord compression. Bilateral foraminal encroachment.  T11-12: Mild disc bulge. Mild facet osteoarthritis. Bilateral foraminal narrowing.  T12-L1: Normal interspace.  L1-2: Normal interspace.  L2-3: Normal interspace.  L3-4: Mild circumferential bulging of the disc. Mild facet osteoarthritis. No compressive stenosis.  L4-5: Circumferential bulging of the disc. Bilateral facet degeneration and hypertrophy. Bilateral foraminal encroachment, right worse than left. Either L4 nerve could be affected.  L5-S1: Previous posterior decompression, diskectomy and fusion. Solid union with sufficient patency of the canal and foramina.  Bilateral sacroiliac osteoarthritis could be symptomatic.  IMPRESSION: L5-S1: Solid union with no  apparent neural compression this level.  L4-5: Worsening of adjacent segment degenerative disease. Circumferential bulging of the disc. Facet degeneration and hypertrophy. Bilateral foraminal stenosis could compress either L5 nerve.  L3-4: Mild bulging of the disc. Mild facet osteoarthritis. No likely compressive stenosis.  Bilateral sacroiliac arthropathy which could  be painful.   Electronically Signed By: Nelson Chimes M.D. On: 10/28/2021 14:43    Narrative CLINICAL DATA:  Post myelogram headache.  FLUOROSCOPY TIME:  18 seconds corresponding to a Dose Area Product of 153 Gy*m2  PROCEDURE: LUMBAR EPIDURAL BLOOD PATCH INJECTION  Informed written consent was obtained.  Time-out was performed.  Prior to the procedure, 20 ml of the patient's blood was harvested using stringent sterile technique.  An interlaminar approach was performed at the same level as the lumbar puncture, L2-3 on the RIGHT. Under stringent sterile technique, overlying skin was cleansed with betadine soap and anesthetized with 1% lidocaine without epinephrine. A 6 inch, 20 gauge needle was advanced using loss-of-resistance technique.  DIAGNOSTIC EPIDURAL INJECTION  Injection of Isovue-M 200 shows a good epidural pattern with spread above and below the level of needle placement, primarily on the side of needle placement. No vascular or subarachnoid opacification is seen.  THERAPEUTIC EPIDURAL INJECTION  15 ml of the patient's blood was injected into the epidural space at the site of prior lumbar puncture.  Patient was observed supine in the nurse's station for 30 minutes, followed by instructions to maintain bed rest for the next 24 hours to allow the blood patch to mature  IMPRESSION: Technically successful lumbar blood patch at L2-3, RIGHT.   Electronically Signed By: Staci Righter M.D. On: 08/16/2015 15:30  DG Hip Unilat W or Wo Pelvis 2-3 Views Right  Narrative CLINICAL DATA:  Patient fell out of her car 3 days ago with right hip pain  EXAM: DG HIP (WITH OR WITHOUT PELVIS) 2-3V RIGHT  COMPARISON:  None.  FINDINGS: There is no evidence of hip fracture or dislocation. Status post prior fixation of the lower lumbar spine.  IMPRESSION: No acute abnormality.   Electronically Signed By: Abelardo Diesel M.D. On: 02/05/2018 15:37  Hip-L DG 2-3  views: Results for orders placed during the hospital encounter of 05/04/18  DG Hip Unilat W or Wo Pelvis 2-3 Views Left  Narrative CLINICAL DATA:  Left hip pain.  Painful range of motion.  EXAM: DG HIP (WITH OR WITHOUT PELVIS) 2-3V LEFT  COMPARISON:  Pelvic radiograph dated 02/05/2018  FINDINGS: There is no evidence of hip fracture or dislocation. There is no evidence of arthropathy or other focal bone abnormality. Previous lumbar fusion at L5-S1.  IMPRESSION: Normal left hip.   Electronically Signed By: Lorriane Shire M.D. On: 05/04/2018 16:26 DG Knee Complete 4 Views Right  Narrative CLINICAL DATA:  Status post fall out of car 3 days ago. Right knee pain.  EXAM: RIGHT KNEE - COMPLETE 4+ VIEW  COMPARISON:  January 17, 2018  FINDINGS: No evidence of fracture, dislocation, or joint effusion. Minimal tibial spine osteophytosis is noted. Soft tissues are unremarkable.  IMPRESSION: No acute fracture or dislocation.   Electronically Signed By: Abelardo Diesel M.D. On: 02/05/2018 15:39  Knee-L DG 4 views: Results for orders placed during the hospital encounter of 01/17/18  DG Knee Complete 4 Views Left  Narrative CLINICAL DATA:  Recent falls.  EXAM: LEFT KNEE - COMPLETE 4+ VIEW  COMPARISON:  12/22/2013  FINDINGS: Soft tissues are unremarkable. Mild degenerative change with small loose bodies. No acute bony  or joint abnormality. No evidence of acute fracture.  IMPRESSION: Mild degenerative changes with small loose bodies. No acute bony abnormality.   Electronically Signed By: Marcello Moores  Register On: 01/18/2018 07:38 DG Ankle Complete Left  Narrative CLINICAL DATA:  56 year old female status post blunt trauma to the fourth and fifth toes while running inside. Pain radiating from the lateral ankle to the lateral foot. Initial encounter.  EXAM: LEFT ANKLE COMPLETE - 3+ VIEW  COMPARISON:  Left foot series from today reported  separately.  FINDINGS: Preserved mortise joint alignment. Talar dome appears intact. No joint effusion. Calcaneus appears intact with degenerative spurring. Distal left tibia and fibula appear intact.  IMPRESSION: No acute fracture or dislocation identified about the left ankle.   Electronically Signed By: Genevie Ann M.D. On: 05/04/2016 14:22   Narrative CLINICAL DATA:  Awakened with right ankle pain and dorsal foot pain. No known injury.  EXAM: RIGHT FOOT COMPLETE - 3+ VIEW  COMPARISON:  None in PACs  FINDINGS: The bones are subjectively adequately mineralized. There is no acute fracture nor dislocation. The joint spaces are well maintained. There is a tiny plantar calcaneal spur. The soft tissues are unremarkable.  IMPRESSION: There is no acute or significant chronic bony abnormality of the right foot.   Electronically Signed By: David  Martinique M.D. On: 12/21/2017 12:29    Narrative CLINICAL DATA:  56 year old female status post blunt trauma to the fourth and fifth toes while running inside. Pain radiating from the lateral ankle to the lateral foot. Initial encounter.  EXAM: LEFT FOOT - COMPLETE 3+ VIEW  COMPARISON:  Left ankle series from today.  FINDINGS: Bone mineralization is within normal limits. Calcaneus intact with plantar degenerative spurring. Normal tarsal bone joint spaces and alignment. Metatarsals appear intact with normal alignment. No phalanx fracture identified. Distal joint spaces are normal.  IMPRESSION: No acute fracture or dislocation identified about the left foot.   Electronically Signed By: Genevie Ann M.D. On: 05/04/2016 14:23     Elbow-L DG Complete: Results for orders placed during the hospital encounter of 10/10/18  DG Elbow Complete Left  Narrative CLINICAL DATA:  Left arm pain/numbness.  EXAM: LEFT ELBOW - COMPLETE 3+ VIEW  COMPARISON:  None.  FINDINGS: There is no evidence of fracture, dislocation, or joint  effusion. There is no evidence of arthropathy or other focal bone abnormality. Soft tissues are unremarkable.  IMPRESSION: Negative.   Electronically Signed By: Fidela Salisbury M.D. On: 10/10/2018 18:06   Wrist Imaging: Wrist-R DG Complete: Results for orders placed during the hospital encounter of 02/18/16  DG Wrist Complete Right  Narrative CLINICAL DATA:  [place] on outstretched arm 1 week ago, persistent right wrist pain  EXAM: RIGHT WRIST - COMPLETE 3+ VIEW  COMPARISON:  None.  FINDINGS: Four views of the right wrist submitted. No acute fracture or subluxation. No radiopaque foreign body.  IMPRESSION: Negative.   Electronically Signed By: Lahoma Crocker M.D. On: 02/18/2016 14:04   Complexity Note: Imaging results reviewed.                         ROS  Cardiovascular: No reported cardiovascular signs or symptoms such as High blood pressure, coronary artery disease, abnormal heart rate or rhythm, heart attack, blood thinner therapy or heart weakness and/or failure Pulmonary or Respiratory: Temporary stoppage of breathing during sleep Neurological: No reported neurological signs or symptoms such as seizures, abnormal skin sensations, urinary and/or fecal incontinence, being born with an  abnormal open spine and/or a tethered spinal cord Psychological-Psychiatric: Anxiousness, Depressed, Prone to panicking, and Difficulty sleeping and or falling asleep Gastrointestinal: Reflux or heatburn Genitourinary: No reported renal or genitourinary signs or symptoms such as difficulty voiding or producing urine, peeing blood, non-functioning kidney, kidney stones, difficulty emptying the bladder, difficulty controlling the flow of urine, or chronic kidney disease Hematological: Brusing easily Endocrine: No reported endocrine signs or symptoms such as high or low blood sugar, rapid heart rate due to high thyroid levels, obesity or weight gain due to slow thyroid or thyroid  disease Rheumatologic: No reported rheumatological signs and symptoms such as fatigue, joint pain, tenderness, swelling, redness, heat, stiffness, decreased range of motion, with or without associated rash Musculoskeletal: Negative for myasthenia gravis, muscular dystrophy, multiple sclerosis or malignant hyperthermia Work History: Working full time  Allergies  Kimberly Cowan is allergic to morphine and related, penicillins, latex, and baclofen.  Laboratory Chemistry Profile   Renal Lab Results  Component Value Date   BUN 5 (L) 12/16/2021   CREATININE 0.62 12/16/2021   BCR 8 (L) 10/02/2021   GFRAA 100 07/24/2020   GFRNONAA >60 12/16/2021   SPECGRAV 1.010 01/16/2020   PHUR 6.5 01/16/2020   PROTEINUR NEGATIVE 03/03/2021     Electrolytes Lab Results  Component Value Date   NA 143 12/16/2021   K 3.2 (L) 12/16/2021   CL 107 12/16/2021   CALCIUM 9.6 12/16/2021   MG 1.9 03/03/2021     Hepatic Lab Results  Component Value Date   AST 20 10/02/2021   ALT 13 10/02/2021   ALBUMIN 3.5 (L) 10/02/2021   ALKPHOS 101 10/02/2021   LIPASE 25 08/31/2016     ID Lab Results  Component Value Date   HIV Non Reactive 03/03/2021   SARSCOV2NAA NEGATIVE 03/03/2021   STAPHAUREUS NEGATIVE 12/16/2021   MRSAPCR NEGATIVE 12/16/2021     Bone No results found for: "VD25OH", "VD125OH2TOT", "ZT2458KD9", "IP3825KN3", "25OHVITD1", "25OHVITD2", "25OHVITD3", "TESTOFREE", "TESTOSTERONE"   Endocrine Lab Results  Component Value Date   GLUCOSE 90 12/16/2021   GLUCOSEU NEGATIVE 03/03/2021   HGBA1C 5.5 10/02/2021   TSH 0.786 10/02/2021     Neuropathy Lab Results  Component Value Date   VITAMINB12 1,286 (H) 10/02/2021   HGBA1C 5.5 10/02/2021   HIV Non Reactive 03/03/2021     CNS No results found for: "COLORCSF", "APPEARCSF", "RBCCOUNTCSF", "WBCCSF", "POLYSCSF", "LYMPHSCSF", "EOSCSF", "PROTEINCSF", "GLUCCSF", "JCVIRUS", "CSFOLI", "IGGCSF", "LABACHR", "ACETBL"   Inflammation (CRP: Acute  ESR:  Chronic) Lab Results  Component Value Date   LATICACIDVEN 1.5 03/03/2021     Rheumatology No results found for: "RF", "ANA", "LABURIC", "URICUR", "LYMEIGGIGMAB", "LYMEABIGMQN", "HLAB27"   Coagulation Lab Results  Component Value Date   INR 1.04 10/10/2018   LABPROT 13.5 10/10/2018   APTT 25 10/10/2018   PLT 197 12/16/2021     Cardiovascular Lab Results  Component Value Date   BNP 10.0 10/10/2018   TROPONINI <0.03 10/10/2018   HGB 13.2 12/16/2021   HCT 41.6 12/16/2021     Screening Lab Results  Component Value Date   SARSCOV2NAA NEGATIVE 03/03/2021   STAPHAUREUS NEGATIVE 12/16/2021   MRSAPCR NEGATIVE 12/16/2021   HIV Non Reactive 03/03/2021     Cancer No results found for: "CEA", "CA125", "LABCA2"   Allergens No results found for: "ALMOND", "APPLE", "ASPARAGUS", "AVOCADO", "BANANA", "BARLEY", "BASIL", "BAYLEAF", "GREENBEAN", "LIMABEAN", "WHITEBEAN", "BEEFIGE", "REDBEET", "BLUEBERRY", "BROCCOLI", "CABBAGE", "MELON", "CARROT", "CASEIN", "CASHEWNUT", "CAULIFLOWER", "CELERY"     Note: Lab results reviewed.  PFSH  Drug: Ms. Busser  reports  no history of drug use. Alcohol:  reports no history of alcohol use. Tobacco:  reports that she has never smoked. She has never used smokeless tobacco. Medical:  has a past medical history of Allergy (1999), Anemia, Anxiety, Arm pain, left (05/17/2018), Arthritis, Bilateral lumbar radiculopathy, Cellulitis of abdominal wall (03/03/2021), Chronic back pain, Chronic GERD (03/03/2021), Clinical depression (04/04/2012), Depression, DVT (deep venous thrombosis) ([place]), Encounter for screening colonoscopy, Essential (primary) hypertension (06/04/2020), Glaucoma, Headache(784.0), Major depressive disorder with single episode, in full remission ([place]) (08/15/2018), Malfunction of jejunostomy tube ([place]) (02/13/2021), Ovarian cyst, Pneumonia, Pulmonary embolism ([place]) (05/2017), S/P laparoscopic cholecystectomy (02/07/2021), and Sleep apnea. Family:  family history includes Arthritis in her father, mother, sister, and sister; Cancer in her father and maternal aunt; Diabetes in her maternal aunt, maternal grandmother, mother, and sister; Hypertension in her father, mother, and sister; Obesity in her daughter, father, mother, sister, and sister; Varicose Veins in her mother.  Past Surgical History:  Procedure Laterality Date   ABDOMINAL HYSTERECTOMY  10/2006   BACK SURGERY  2014   lumbar fusion   BACK SURGERY  2009   discectomy   BARIATRIC SURGERY  07/2020   Revision   [place]   CHOLECYSTECTOMY  01/2021   COLONOSCOPY WITH PROPOFOL N/A 04/12/2018   Procedure: COLONOSCOPY WITH PROPOFOL;  Surgeon: Lin Landsman, MD;  Location[facility]COPY;  Service: Gastroenterology;  Laterality: N/A;   GASTRIC BYPASS  2005   KNEE ARTHROSCOPY WITH LATERAL MENISECTOMY Left 05/27/2017   Procedure: KNEE ARTHROSCOPY WITH LATERAL MENISECTOMY;  Surgeon: Hessie Knows, MD;  Location: [facility] ORS;  Service: Orthopedics;  Laterality: Left;   KNEE ARTHROSCOPY WITH LATERAL RELEASE Left 05/27/2017   Procedure: KNEE ARTHROSCOPY WITH LATERAL RELEASE;  Surgeon: Hessie Knows, MD;  Location: [facility] ORS;  Service: Orthopedics;  Laterality: Left;   LAPAROSCOPIC SALPINGO OOPHERECTOMY Bilateral 10/05/2016   Procedure: LAPAROSCOPIC SALPINGO OOPHORECTOMY;  Surgeon: Brayton Mars, MD;  Location: [facility] ORS;  Service: Gynecology;  Laterality: Bilateral;   REDUCTION MAMMAPLASTY Bilateral    2001   SPINAL CORD STIMULATOR INSERTION N/A 03/20/2016   Procedure: LUMBAR SPINAL CORD STIMULATOR INSERTION;  Surgeon: Eustace Moore, MD;  Location: [place] [facility]URO ORS;  Service: Neurosurgery;  Laterality: N/A;   [place]  2009 2014 2017   TONSILLECTOMY     Active Ambulatory Problems    Diagnosis Date Noted   Headache, migraine 04/04/2012   Prediabetes 07/01/2015   Status post gastric bypass for obesity 07/04/2015   Chronic radicular lumbar pain 08/14/2015    OSA on CPAP 03/04/2016   B12 nutritional deficiency 08/05/2016   Vasomotor symptoms due to menopause 11/11/2016   Tinea versicolor 02/03/2017   Environmental and seasonal allergies 04/07/2017   History of pulmonary embolism 06/10/2017   Perforated ear drum, left 06/25/2017   Mild peripheral edema 11/15/2017   Primary insomnia 12/28/2019   Status post hysterectomy 11/05/2020   Chronic GERD 03/03/2021   Failed back surgical syndrome 11/27/2019   Spinal stenosis of lumbar region 12/10/2014   Chronic depression 12/09/2015   Anxiety 12/09/2015   Bariatric surgery status 01/01/2020   Glaucoma 09/28/2016   Degenerative spondylolisthesis 12/19/2021   Spinal cord stimulator status [employer], implanted 2017) 04/28/2022   History of lumbar fusion (L5-S1 PSIF) 04/28/2022   Encounter for long-term opiate analgesic use 04/28/2022   Chronic pain syndrome 04/28/2022   Resolved Ambulatory Problems    Diagnosis Date Noted   Spinal stenosis, lumbar region, with neurogenic claudication 11/11/2012   Clinical depression  04/04/2012   Back pain at L4-L5 level 08/14/2015   DDD (degenerative disc disease), lumbosacral 08/14/2015   Spinal stenosis of lumbar region 08/14/2015   S/P spinal surgery 08/14/2015   Back pain 03/20/2016   Status post laparoscopic supracervical hysterectomy 09/10/2016   Right lower quadrant pain 09/10/2016   Right ovarian cyst 09/10/2016   Status post bilateral salpingo-oophorectomy (BSO) 10/05/2016   Hydrosalpinx 11/11/2016   Serous cystadenoma of ovary, right 11/11/2016   Knee pain, left 04/07/2017   Encounter for screening colonoscopy    Arm pain, left 05/17/2018   Major depressive disorder with single episode, in full remission ([place]) 08/15/2018   Sepsis ([place]) 03/03/2021   Hypokalemia 03/03/2021   Malfunction of jejunostomy tube ([place]) 02/13/2021   Cellulitis of abdominal wall 03/03/2021   Headache disorder 10/09/2020   S/P laparoscopic cholecystectomy  02/07/2021   Past Medical History:  Diagnosis Date   Allergy 1999   Anemia    Arthritis    Bilateral lumbar radiculopathy    Chronic back pain    Depression    DVT (deep venous thrombosis) ([place])    Essential (primary) hypertension 06/04/2020   Headache(784.0)    Ovarian cyst    Pneumonia    Pulmonary embolism ([place]) 05/2017   Sleep apnea    Constitutional Exam  General appearance: Well nourished, well developed, and well hydrated. In no apparent acute distress Vitals:   04/28/22 1259  BP: (!) 169/91  Pulse: 81  Resp: 16  Temp: (!) 97.1 F (36.2 C)  TempSrc: Temporal  SpO2: 100%  Weight: 236 lb (107 kg)  Height: 5' 7"  (1.702 m)   BMI Assessment: Estimated body mass index is 36.96 kg/m as calculated from the following:   Height as of this encounter: 5' 7"  (1.702 m).   Weight as of this encounter: 236 lb (107 kg).  BMI interpretation table: BMI level Category Range association with higher incidence of chronic pain  <18 kg/m2 Underweight   18.5-24.9 kg/m2 Ideal body weight   25-29.9 kg/m2 Overweight Increased incidence by 20%  30-34.9 kg/m2 Obese (Class I) Increased incidence by 68%  35-39.9 kg/m2 Severe obesity (Class II) Increased incidence by 136%  >40 kg/m2 Extreme obesity (Class III) Increased incidence by 254%   Patient's current BMI Ideal Body weight  Body mass index is 36.96 kg/m. Ideal body weight: 61.6 kg (135 lb 12.9 oz) Adjusted ideal body weight: 79.8 kg (175 lb 14.1 oz)   BMI Readings from Last 4 Encounters:  04/28/22 36.96 kg/m  03/15/22 36.96 kg/m  12/19/21 38.22 kg/m  12/16/21 38.31 kg/m   Wt Readings from Last 4 Encounters:  04/28/22 236 lb (107 kg)  03/15/22 236 lb (107 kg)  12/19/21 244 lb (110.7 kg)  12/16/21 244 lb 9.6 oz (110.9 kg)    Psych/Mental status: Alert, oriented x 3 (person, place, & time)       Eyes: PERLA Respiratory: No evidence of acute respiratory distress  Thoracic Spine Area Exam  Skin & Axial Inspection: Well  healed scar from previous spine surgery detected Alignment: Symmetrical Functional ROM: Pain restricted ROM Stability: No instability detected Muscle Tone/Strength: Functionally intact. No obvious neuro-muscular anomalies detected. Sensory (Neurological): Neurogenic pain pattern Muscle strength & Tone: No palpable anomalies Lumbar Spine Area Exam  Skin & Axial Inspection: Well healed scar from previous spine surgery detected spinal cord stimulator IPG palpated along the left flank Alignment: Symmetrical Functional ROM: Pain restricted ROM       Stability: No instability detected Muscle Tone/Strength: Functionally  intact. No obvious neuro-muscular anomalies detected. Sensory (Neurological): Dermatomal pain pattern  Gait & Posture Assessment  Ambulation: Limited Gait: Antalgic gait (limping) Posture: Difficulty standing up straight, due to pain  Lower Extremity Exam    Side: Right lower extremity  Side: Left lower extremity  Stability: No instability observed          Stability: No instability observed          Skin & Extremity Inspection: Skin color, temperature, and hair growth are WNL. No peripheral edema or cyanosis. No masses, redness, swelling, asymmetry, or associated skin lesions. No contractures.  Skin & Extremity Inspection: Skin color, temperature, and hair growth are WNL. No peripheral edema or cyanosis. No masses, redness, swelling, asymmetry, or associated skin lesions. No contractures.  Functional ROM: Unrestricted ROM                  Functional ROM: Unrestricted ROM                  Muscle Tone/Strength: Functionally intact. No obvious neuro-muscular anomalies detected.  Muscle Tone/Strength: Functionally intact. No obvious neuro-muscular anomalies detected.  Sensory (Neurological): Musculoskeletal pain pattern        Sensory (Neurological): Musculoskeletal pain pattern        DTR: Patellar: deferred today Achilles: deferred today Plantar: deferred today  DTR: Patellar:  deferred today Achilles: deferred today Plantar: deferred today  Palpation: No palpable anomalies  Palpation: No palpable anomalies    Assessment  Primary Diagnosis & Pertinent Problem List: The primary encounter diagnosis was Failed back surgical syndrome. Diagnoses of Spinal cord stimulator status [employer], implanted 2017), Chronic radicular lumbar pain, History of lumbar fusion (L5-S1 PSIF), Encounter for long-term opiate analgesic use, and Chronic pain syndrome were also pertinent to this visit.  Visit Diagnosis (New problems to examiner): 1. Failed back surgical syndrome   2. Spinal cord stimulator status [employer], implanted 2017)   3. Chronic radicular lumbar pain   4. History of lumbar fusion (L5-S1 PSIF)   5. Encounter for long-term opiate analgesic use   6. Chronic pain syndrome    Plan of Care (Initial workup plan)  Note: Kimberly Cowan was reminded that as per protocol, today's visit has been an evaluation only. We have not taken over the patient's controlled substance management.  Lab Orders         Compliance Drug Analysis, Ur      Pharmacotherapy (current): Medications ordered:  No orders of the defined types were placed in this encounter.  Medications administered during this visit: Kimberly Cowan "Kimberly Cowan" had no medications administered during this visit.   Pharmacological management options:  Opioid Analgesics: The patient was informed that there is no guarantee that she would be a candidate for opioid analgesics. The decision will be made following CDC guidelines. This decision will be based on the results of diagnostic studies, as well as Kimberly Cowan's risk profile.   Membrane stabilizer: Adequate regimen gabapentin 600 mg nightly  Muscle relaxant: Adequate regimen Flexeril 10 mg daily as needed  NSAID: To be determined at a later time  Other analgesic(s): To be determined at a later time          Provider-requested  follow-up: Return in about 9 days (around 05/07/2022) for 2nd pt visit .  Future Appointments  Date Time Provider [place]  05/07/2022  3:00 PM Gillis Santa, MD [facility] None  08/03/2022  3:20 PM [place] ADVISOR [facility]  10/05/2022  8:40 AM  Glean Hess, MD [facility]    Note by: Gillis Santa, MD Date: 04/28/2022; Time: 2:47 PM

## 2022-04-28 NOTE — Progress Notes (Signed)
Safety precautions to be maintained throughout the outpatient stay will include: orient to surroundings, keep bed in low position, maintain call bell within reach at all times, provide assistance with transfer out of bed and ambulation.  

## 2022-05-03 LAB — COMPLIANCE DRUG ANALYSIS, UR

## 2022-05-07 ENCOUNTER — Ambulatory Visit
Payer: BC Managed Care – PPO | Attending: Student in an Organized Health Care Education/Training Program | Admitting: Student in an Organized Health Care Education/Training Program

## 2022-05-07 ENCOUNTER — Encounter: Payer: Self-pay | Admitting: Student in an Organized Health Care Education/Training Program

## 2022-05-07 ENCOUNTER — Other Ambulatory Visit: Payer: Self-pay | Admitting: Internal Medicine

## 2022-05-07 VITALS — BP 135/96 | HR 76 | Temp 97.0°F | Resp 18 | Ht 67.0 in | Wt 236.0 lb

## 2022-05-07 DIAGNOSIS — Z9689 Presence of other specified functional implants: Secondary | ICD-10-CM | POA: Insufficient documentation

## 2022-05-07 DIAGNOSIS — Z0289 Encounter for other administrative examinations: Secondary | ICD-10-CM | POA: Diagnosis not present

## 2022-05-07 DIAGNOSIS — Z981 Arthrodesis status: Secondary | ICD-10-CM | POA: Insufficient documentation

## 2022-05-07 DIAGNOSIS — F5101 Primary insomnia: Secondary | ICD-10-CM

## 2022-05-07 DIAGNOSIS — Z79891 Long term (current) use of opiate analgesic: Secondary | ICD-10-CM | POA: Diagnosis not present

## 2022-05-07 DIAGNOSIS — G8929 Other chronic pain: Secondary | ICD-10-CM | POA: Diagnosis not present

## 2022-05-07 DIAGNOSIS — G894 Chronic pain syndrome: Secondary | ICD-10-CM | POA: Diagnosis not present

## 2022-05-07 DIAGNOSIS — M961 Postlaminectomy syndrome, not elsewhere classified: Secondary | ICD-10-CM | POA: Insufficient documentation

## 2022-05-07 DIAGNOSIS — M5416 Radiculopathy, lumbar region: Secondary | ICD-10-CM | POA: Insufficient documentation

## 2022-05-07 MED ORDER — HYDROCODONE-ACETAMINOPHEN 7.5-325 MG PO TABS: 1.0000 | ORAL_TABLET | Freq: Every evening | ORAL | 0 refills | Status: DC | PRN

## 2022-05-07 NOTE — Progress Notes (Signed)
Safety precautions to be maintained throughout the outpatient stay will include: orient to surroundings, keep bed in low position, maintain call bell within reach at all times, provide assistance with transfer out of bed and ambulation.  

## 2022-05-07 NOTE — Patient Instructions (Signed)
Sign pain contract.

## 2022-05-07 NOTE — Progress Notes (Signed)
PROVIDER NOTE: Information contained herein reflects review and annotations entered in association with encounter. Interpretation of such information and data should be left to medically-trained personnel. Information provided to patient can be located elsewhere in the medical record under "Patient Instructions". Document created using STT-dictation technology, any transcriptional errors that may result from process are unintentional.    Patient: Kimberly Cowan  Service Category: E/M  Provider: Gillis Santa, MD  DOB: 06/02/1966  DOS: 05/07/2022  Referring Provider: Glean Hess, MD  MRN: [id number]  Specialty: Interventional Pain Management  PCP: Glean Hess, MD  Type: Established Patient  Setting: Ambulatory outpatient    Location: Office  Delivery: Face-to-face     Primary Reason(s) for Visit: Encounter for evaluation before starting new chronic pain management plan of care (Level of risk: moderate) CC: Back Pain  HPI  Kimberly Cowan is a 56 y.o. year old, female patient, who comes today for a follow-up evaluation to review the test results and decide on a treatment plan. She has Headache, migraine; Prediabetes; Status post gastric bypass for obesity; Chronic radicular lumbar pain; OSA on CPAP; B12 nutritional deficiency; Vasomotor symptoms due to menopause; Tinea versicolor; Environmental and seasonal allergies; History of pulmonary embolism; Perforated ear drum, left; Mild peripheral edema; Primary insomnia; Status post hysterectomy; Chronic GERD; Failed back surgical syndrome; Spinal stenosis of lumbar region; Chronic depression; Anxiety; Bariatric surgery status; Glaucoma; Degenerative spondylolisthesis; Spinal cord stimulator status [employer], implanted 2017); History of lumbar fusion (L5-S1 PSIF); Encounter for long-term opiate analgesic use; Chronic pain syndrome; and Pain management contract signed on their problem list. Her primarily concern today is the Back  Pain  Pain Assessment: Location: Lower Back Radiating: radiates down back and front of both legs Onset: More than a month ago Duration: Chronic pain Quality: Aching, Constant, Pressure, Sharp, Shooting, Stabbing Severity: 9 /10 (subjective, self-reported pain score)  Effect on ADL: Limits activities Timing: Constant Modifying factors: scs, lying down BP: (!) 135/96  HR: 76  Kimberly Cowan comes in today for a follow-up visit after her initial evaluation on 04/28/2022.   Second patient visit today.  She has completed her urine toxicology screen which is appropriate.  I will have her sign pain contract and take her on for chronic pain management.  I will send in a prescription for hydrocodone 7.5 mg nightly.  I will see her back in 4 weeks for medication management.  Please see initial clinic visit HPI below Kimberly Cowan is a pleasant 57 year old female with a history of L4-S1 spinal fusion with [employer] spinal cord stimulator in place who is hoping to establish with pain management as her previous pain provider left [place] [facility] and is no longer excepting new patients.  She states that she has done physical therapy in the past tried various medications as well as injections with limited response.  She had her spinal cord stimulator implanted in 2017 with [place] which was beneficial for her and allowed her to wean her hydrocodone to just once at night.  She states that she is able to manage with 7.5 mg nightly.  She is hoping to have this continued.  She also has restless leg syndrome and obesity.  She also has obstructive sleep apnea.  Given that she is on low-dose and has been compliant with previous pain clinic protocol, I will have her obtain baseline urine toxicology screen which is customary for new patients and I will see her back in approximately 7 to 10 days to sign pain contract  and take her on for medication   Controlled Substance Pharmacotherapy Assessment REMS  (Risk Evaluation and Mitigation Strategy)  Opioid Analgesic: Hydrocodone 7.5 mg nightly as needed Pill Count: None expected due to no prior prescriptions written by our practice. Dewayne Shorter, RN  05/07/2022  2:52 PM  Sign when Signing Visit Safety precautions to be maintained throughout the outpatient stay will include: orient to surroundings, keep bed in low position, maintain call bell within reach at all times, provide assistance with transfer out of bed and ambulation. Pharmacokinetics: Liberation and absorption (onset of action): WNL Distribution (time to peak effect): WNL Metabolism and excretion (duration of action): WNL         Pharmacodynamics: Desired effects: Analgesia: Kimberly Cowan reports >50% benefit. Functional ability: Patient reports that medication allows her to accomplish basic ADLs Clinically meaningful improvement in function (CMIF): Sustained CMIF goals met Perceived effectiveness: Described as relatively effective, allowing for increase in activities of daily living (ADL) Undesirable effects: Side-effects or Adverse reactions: None reported Monitoring: [place] PMP: PDMP not reviewed this encounter. Online review of the past 78-monthperiod previously conducted. Not applicable at this point since we have not taken over the patient's medication management yet. List of other Serum/Urine Drug Screening Test(s):  No results found for: "AMPHSCRSER", "BARBSCRSER", "BENZOSCRSER", "COCAINSCRSER", "COCAINSCRNUR", "PCPSCRSER", "THCSCRSER", "THCU", "CANNABQUANT", "OPIATESCRSER", "OXYSCRSER", "PROPOXSCRSER", "ETH", "CBDTHCR", "D8THCCBX", "D9THCCBX" List of all UDS test(s) done:  Lab Results  Component Value Date   TOXASSSELUR FINAL 08/14/2015   SUMMARY Note 04/28/2022   Last UDS on record: Tox[employer] Select 13  Date Value Ref Range Status  08/14/2015 FINAL  Final    Comment:    ==================================================================== TOXASSURE SELECT 13  (MW) ==================================================================== Test                             Result       Flag       Units Drug Present not Declared for Prescription Verification   Temazepam                      91           UNEXPECTED ng/mg creat    Temazepam may be administered as a scheduled prescription    medication; it is also an expected metabolite of diazepam. ==================================================================== Test                      Result    Flag   Units      Ref Range   Creatinine              68               mg/dL      >=20 ==================================================================== Declared Medications:  The flagging and interpretation on this report are based on the  following declared medications.  Unexpected results may arise from  inaccuracies in the declared medications.  No medication use reported. ==================================================================== For clinical consultation, please call ([phone] ====================================================================    Summary  Date Value Ref Range Status  04/28/2022 Note  Final    Comment:    ==================================================================== Compliance Drug Analysis, Ur ==================================================================== Test                             Result       Flag       Units  Drug Present  and Declared for Prescription Verification   Oxazepam                       641          EXPECTED   ng/mg creat   Temazepam                      >2532        EXPECTED   ng/mg creat    Oxazepam and temazepam are expected metabolites of diazepam.    Oxazepam is also an expected metabolite of other benzodiazepine    drugs, including chlordiazepoxide, prazepam, clorazepate, halazepam,    and temazepam.  Oxazepam and temazepam are available as scheduled    prescription medications.    Gabapentin                      PRESENT      EXPECTED   Cyclobenzaprine                PRESENT      EXPECTED   Desmethylcyclobenzaprine       PRESENT      EXPECTED    Desmethylcyclobenzaprine is an expected metabolite of    cyclobenzaprine.    Acetaminophen                  PRESENT      EXPECTED  Drug Absent but Declared for Prescription Verification   Hydrocodone                    Not Detected UNEXPECTED ng/mg creat   Methocarbamol                  Not Detected UNEXPECTED ==================================================================== Test                      Result    Flag   Units      Ref Range   Creatinine              79               mg/dL      >=20 ==================================================================== Declared Medications:  The flagging and interpretation on this report are based on the  following declared medications.  Unexpected results may arise from  inaccuracies in the declared medications.   **Note: The testing scope of this panel includes these medications:   Cyclobenzaprine (Flexeril)  Gabapentin (Neurontin)  Hydrocodone (Norco)  Methocarbamol (Robaxin)  Temazepam (Restoril)   **Note: The testing scope of this panel does not include small to  moderate amounts of these reported medications:   Acetaminophen (Norco)   **Note: The testing scope of this panel does not include the  following reported medications:   Fluticasone (Flonase)  Furosemide (Lasix)  Ketoconazole (Nizoral)  Latanoprost (Xalatan)  Levocetirizine (Xyzal)  Multivitamin  Pantoprazole (Protonix)  Rizatriptan (Maxalt)  Scopolamine  Vitamin B12 ==================================================================== For clinical consultation, please call [phone]. ====================================================================    UDS interpretation: No unexpected findings.          Medication Assessment Form: Not applicable. No opioids. Treatment compliance: Not applicable Risk Assessment  Profile: Aberrant behavior: See initial evaluations. None observed or detected today Comorbid factors increasing risk of overdose: See initial evaluation. No additional risks detected today Opioid risk tool (ORT):     04/28/2022    1:06 PM  Opioid Risk   Alcohol 1  Illegal Drugs 0  Rx Drugs 0  Alcohol 0  Illegal Drugs 0  Rx Drugs 0  Age between 16-45 years  0  Psychological Disease 0  Depression 1  Opioid Risk Tool Scoring 2  Opioid Risk Interpretation Low Risk    ORT Scoring interpretation table:  Score <3 = Low Risk for SUD  Score between 4-7 = Moderate Risk for SUD  Score >8 = High Risk for Opioid Abuse   Risk of substance use disorder (SUD): Low  Risk Mitigation Strategies:  Patient opioid safety counseling: Completed today. Counseling provided to patient as per "Patient Counseling Document". Document signed by patient, attesting to counseling and understanding Patient-Prescriber Agreement (PPA): Obtained today.  Controlled substance notification to other providers: Written and sent today.  Pharmacologic Plan: Today we may be taking over the patient's pharmacological regimen. See below.             Laboratory Chemistry Profile   Renal Lab Results  Component Value Date   BUN 5 (L) 12/16/2021   CREATININE 0.62 12/16/2021   BCR 8 (L) 10/02/2021   GFRAA 100 07/24/2020   GFRNONAA >60 12/16/2021   SPECGRAV 1.010 01/16/2020   PHUR 6.5 01/16/2020   PROTEINUR NEGATIVE 03/03/2021     Electrolytes Lab Results  Component Value Date   NA 143 12/16/2021   K 3.2 (L) 12/16/2021   CL 107 12/16/2021   CALCIUM 9.6 12/16/2021   MG 1.9 03/03/2021     Hepatic Lab Results  Component Value Date   AST 20 10/02/2021   ALT 13 10/02/2021   ALBUMIN 3.5 (L) 10/02/2021   ALKPHOS 101 10/02/2021   LIPASE 25 08/31/2016     ID Lab Results  Component Value Date   HIV Non Reactive 03/03/2021   SARSCOV2NAA NEGATIVE 03/03/2021   STAPHAUREUS NEGATIVE 12/16/2021   MRSAPCR NEGATIVE  12/16/2021     Bone No results found for: "VD25OH", "VD125OH2TOT", "HC6237SE8", "BT5176HY0", "25OHVITD1", "25OHVITD2", "25OHVITD3", "TESTOFREE", "TESTOSTERONE"   Endocrine Lab Results  Component Value Date   GLUCOSE 90 12/16/2021   GLUCOSEU NEGATIVE 03/03/2021   HGBA1C 5.5 10/02/2021   TSH 0.786 10/02/2021     Neuropathy Lab Results  Component Value Date   VITAMINB12 1,286 (H) 10/02/2021   HGBA1C 5.5 10/02/2021   HIV Non Reactive 03/03/2021     CNS No results found for: "COLORCSF", "APPEARCSF", "RBCCOUNTCSF", "WBCCSF", "POLYSCSF", "LYMPHSCSF", "EOSCSF", "PROTEINCSF", "GLUCCSF", "JCVIRUS", "CSFOLI", "IGGCSF", "LABACHR", "ACETBL"   Inflammation (CRP: Acute  ESR: Chronic) Lab Results  Component Value Date   LATICACIDVEN 1.5 03/03/2021     Rheumatology No results found for: "RF", "ANA", "LABURIC", "URICUR", "LYMEIGGIGMAB", "LYMEABIGMQN", "HLAB27"   Coagulation Lab Results  Component Value Date   INR 1.04 10/10/2018   LABPROT 13.5 10/10/2018   APTT 25 10/10/2018   PLT 197 12/16/2021     Cardiovascular Lab Results  Component Value Date   BNP 10.0 10/10/2018   TROPONINI <0.03 10/10/2018   HGB 13.2 12/16/2021   HCT 41.6 12/16/2021     Screening Lab Results  Component Value Date   SARSCOV2NAA NEGATIVE 03/03/2021   STAPHAUREUS NEGATIVE 12/16/2021   MRSAPCR NEGATIVE 12/16/2021   HIV Non Reactive 03/03/2021     Cancer No results found for: "CEA", "CA125", "LABCA2"   Allergens No results found for: "ALMOND", "APPLE", "ASPARAGUS", "AVOCADO", "BANANA", "BARLEY", "BASIL", "BAYLEAF", "GREENBEAN", "LIMABEAN", "WHITEBEAN", "BEEFIGE", "REDBEET", "BLUEBERRY", "BROCCOLI", "CABBAGE", "MELON", "CARROT", "CASEIN", "CASHEWNUT", "CAULIFLOWER", "CELERY"     Note: Lab results reviewed.  Recent Diagnostic Imaging Review  Cervical Imaging: Cervical MR wo  contrast: No results found for this or any previous visit.  Cervical MR wo contrast: No valid procedures specified. Cervical  CT wo contrast: Results for orders placed during the hospital encounter of 07/20/18  CT CERVICAL SPINE WO CONTRAST  Narrative CLINICAL DATA:  Neck pain radiating to the left arm. Patient does have carpal tunnel syndrome.  EXAM: CT CERVICAL SPINE WITHOUT CONTRAST  TECHNIQUE: Multidetector CT imaging of the cervical spine was performed without intravenous contrast. Multiplanar CT image reconstructions were also generated.  COMPARISON:  None.  FINDINGS: Alignment: Normal  Skull base and vertebrae: Normal. Detail is limited from C4-T2 because of body habitus.  Soft tissues and spinal canal: Negative  Disc levels:  No abnormality at the foramen magnum or C1-2.  C2-3: Normal interspace.  C3-4: Minimal uncovertebral prominence. No canal or foraminal stenosis.  C4-5: Minimal uncovertebral prominence. No canal or foraminal stenosis.  C5-6: Normal interspace.  C6-7: Normal interspace.  C7-T1: Limited detail.  No abnormality seen.  T1-2: Limited detail.  No abnormality seen.  Upper chest: Negative  Other: None  IMPRESSION: Limited detail because of body habitus. No advanced or significant finding is identified. Minimal upper cervical uncovertebral prominence but without evidence of compressive narrowing of the canal or foramina.   Electronically Signed By: Nelson Chimes M.D. On: 07/20/2018 12:48   Shoulder-L DG: Results for orders placed during the hospital encounter of 10/10/18  DG Shoulder Left  Narrative CLINICAL DATA:  Pain and swelling  EXAM: LEFT SHOULDER - 2+ VIEW  COMPARISON:  October 31, 2014  FINDINGS: Frontal, oblique, and Y scapular images were obtained. There is no appreciable fracture or dislocation. Joint spaces appear normal. No erosive change or intra-articular calcification. Visualized left lung clear.  IMPRESSION: No fracture or dislocation.  No appreciable arthropathy.   Electronically Signed By: Lowella Grip III  M.D. On: 10/10/2018 18:07  Hip-R DG 2-3 views: Results for orders placed during the hospital encounter of 02/05/18  DG Hip Unilat W or Wo Pelvis 2-3 Views Right  Narrative CLINICAL DATA:  Patient fell out of her car 3 days ago with right hip pain  EXAM: DG HIP (WITH OR WITHOUT PELVIS) 2-3V RIGHT  COMPARISON:  None.  FINDINGS: There is no evidence of hip fracture or dislocation. Status post prior fixation of the lower lumbar spine.  IMPRESSION: No acute abnormality.   Electronically Signed By: Abelardo Diesel M.D. On: 02/05/2018 15:37  Hip-L DG 2-3 views: Results for orders placed during the hospital encounter of 05/04/18  DG Hip Unilat W or Wo Pelvis 2-3 Views Left  Narrative CLINICAL DATA:  Left hip pain.  Painful range of motion.  EXAM: DG HIP (WITH OR WITHOUT PELVIS) 2-3V LEFT  COMPARISON:  Pelvic radiograph dated 02/05/2018  FINDINGS: There is no evidence of hip fracture or dislocation. There is no evidence of arthropathy or other focal bone abnormality. Previous lumbar fusion at L5-S1.  IMPRESSION: Normal left hip.   Electronically Signed By: Lorriane Shire M.D. On: 05/04/2018 16:26   Narrative CLINICAL DATA:  Status post fall out of car 3 days ago. Right knee pain.  EXAM: RIGHT KNEE - COMPLETE 4+ VIEW  COMPARISON:  January 17, 2018  FINDINGS: No evidence of fracture, dislocation, or joint effusion. Minimal tibial spine osteophytosis is noted. Soft tissues are unremarkable.  IMPRESSION: No acute fracture or dislocation.   Electronically Signed By: Abelardo Diesel M.D. On: 02/05/2018 15:39  Knee-L DG 4 views: Results for orders placed during the hospital encounter of  01/17/18  DG Knee Complete 4 Views Left  Narrative CLINICAL DATA:  Recent falls.  EXAM: LEFT KNEE - COMPLETE 4+ VIEW  COMPARISON:  12/22/2013  FINDINGS: Soft tissues are unremarkable. Mild degenerative change with small loose bodies. No acute bony or joint  abnormality. No evidence of acute fracture.  IMPRESSION: Mild degenerative changes with small loose bodies. No acute bony abnormality.   Electronically Signed By: Marcello Moores  Register On: 01/18/2018 07:38   Ankle Imaging: Ankle-R DG Complete: No results found for this or any previous visit.  Ankle-L DG Complete: Results for orders placed during the hospital encounter of 05/04/16  DG Ankle Complete Left  Narrative CLINICAL DATA:  56 year old female status post blunt trauma to the fourth and fifth toes while running inside. Pain radiating from the lateral ankle to the lateral foot. Initial encounter.  EXAM: LEFT ANKLE COMPLETE - 3+ VIEW  COMPARISON:  Left foot series from today reported separately.  FINDINGS: Preserved mortise joint alignment. Talar dome appears intact. No joint effusion. Calcaneus appears intact with degenerative spurring. Distal left tibia and fibula appear intact.  IMPRESSION: No acute fracture or dislocation identified about the left ankle.   Electronically Signed By: Genevie Ann M.D. On: 05/04/2016 14:22   Foot Imaging: Foot-R DG Complete: Results for orders placed during the hospital encounter of 12/21/17  DG Foot Complete Right  Narrative CLINICAL DATA:  Awakened with right ankle pain and dorsal foot pain. No known injury.  EXAM: RIGHT FOOT COMPLETE - 3+ VIEW  COMPARISON:  None in PACs  FINDINGS: The bones are subjectively adequately mineralized. There is no acute fracture nor dislocation. The joint spaces are well maintained. There is a tiny plantar calcaneal spur. The soft tissues are unremarkable.  IMPRESSION: There is no acute or significant chronic bony abnormality of the right foot.   Electronically Signed By: David  Martinique M.D. On: 12/21/2017 12:29  Foot-L DG Complete: Results for orders placed during the hospital encounter of 05/04/16  DG Foot Complete Left  Narrative CLINICAL DATA:  56 year old female status post  blunt trauma to the fourth and fifth toes while running inside. Pain radiating from the lateral ankle to the lateral foot. Initial encounter.  EXAM: LEFT FOOT - COMPLETE 3+ VIEW  COMPARISON:  Left ankle series from today.  FINDINGS: Bone mineralization is within normal limits. Calcaneus intact with plantar degenerative spurring. Normal tarsal bone joint spaces and alignment. Metatarsals appear intact with normal alignment. No phalanx fracture identified. Distal joint spaces are normal.  IMPRESSION: No acute fracture or dislocation identified about the left foot.   Electronically Signed By: Genevie Ann M.D. On: 05/04/2016 14:23   Elbow Imaging: Elbow-R DG Complete: No results found for this or any previous visit.  Elbow-L DG Complete: Results for orders placed during the hospital encounter of 10/10/18  DG Elbow Complete Left  Narrative CLINICAL DATA:  Left arm pain/numbness.  EXAM: LEFT ELBOW - COMPLETE 3+ VIEW  COMPARISON:  None.  FINDINGS: There is no evidence of fracture, dislocation, or joint effusion. There is no evidence of arthropathy or other focal bone abnormality. Soft tissues are unremarkable.  IMPRESSION: Negative.   Electronically Signed By: Fidela Salisbury M.D. On: 10/10/2018 18:06   Wrist Imaging: Wrist-R DG Complete: Results for orders placed during the hospital encounter of 02/18/16  DG Wrist Complete Right  Narrative CLINICAL DATA:  [place] on outstretched arm 1 week ago, persistent right wrist pain  EXAM: RIGHT WRIST - COMPLETE 3+ VIEW  COMPARISON:  None.  FINDINGS: Four views of the right wrist submitted. No acute fracture or subluxation. No radiopaque foreign body.  IMPRESSION: Negative.   Electronically Signed By: Lahoma Crocker M.D. On: 02/18/2016 14:04  Wrist-L DG Complete: No results found for this or any previous visit.   Hand Imaging: Hand-R DG Complete: No results found for this or any previous visit.  Hand-L DG  Complete: No results found for this or any previous visit.   Complexity Note: Imaging results reviewed.                         Meds   Current Outpatient Medications:    cyanocobalamin (,VITAMIN B-12,) 1000 MCG/ML injection, ADMINISTER 1 ML(1000 MCG) IN THE MUSCLE EVERY 30 DAYS, Disp: 1 mL, Rfl: 5   cyclobenzaprine (FLEXERIL) 10 MG tablet, Take 10 mg by mouth daily as needed for muscle spasms., Disp: , Rfl:    fluticasone (FLONASE) 50 MCG/ACT nasal spray, Place 1 spray into both nostrils daily as needed for allergies., Disp: 16 g, Rfl: 1   furosemide (LASIX) 20 MG tablet, Take 1 tablet (20 mg total) by mouth daily., Disp: 90 tablet, Rfl: 0   gabapentin (NEURONTIN) 300 MG capsule, Take 600 mg by mouth at bedtime., Disp: , Rfl:    HYDROcodone-acetaminophen (NORCO) 7.5-325 MG tablet, Take 1 tablet by mouth at bedtime as needed for severe pain. Must last 30 days., Disp: 30 tablet, Rfl: 0   ketoconazole (NIZORAL) 2 % shampoo, Apply 1 application topically daily as needed (for skin fungus on neck/back)., Disp: 120 mL, Rfl: 1   latanoprost (XALATAN) 0.005 % ophthalmic solution, Place 1 drop into both eyes at bedtime. , Disp: , Rfl:    levocetirizine (XYZAL) 5 MG tablet, Take 1 tablet (5 mg total) by mouth every evening., Disp: 90 tablet, Rfl: 2   Multiple Vitamins-Minerals (MULTIVITAMIN GUMMIES ADULTS PO), Take 1 tablet by mouth daily., Disp: , Rfl:    pantoprazole (PROTONIX) 40 MG tablet, TAKE ONE TABLET BY MOUTH DAILY (Patient taking differently: 40 mg daily.), Disp: 90 tablet, Rfl: 1   rizatriptan (MAXALT-MLT) 10 MG disintegrating tablet, DISSOLVE 1 TABLET BY MOUTH AS NEEDED FOR MIGRAINE- MAY REPEAT IN 2 HOURS AS NEEDED, Disp: 10 tablet, Rfl: 0   scopolamine (TRANSDERM-SCOP) 1 MG/3DAYS, Place 1 patch onto the skin every 3 (three) days. As needed for nausea, Disp: , Rfl:    temazepam (RESTORIL) 15 MG capsule, TAKE 1 CAPSULE(15 MG) BY MOUTH AT BEDTIME AS NEEDED FOR SLEEP, Disp: 30 capsule, Rfl:  2  Current Facility-Administered Medications:    cyanocobalamin ((VITAMIN B-12)) injection 1,000 mcg, 1,000 mcg, Intramuscular, Once, [employer] Melia Jesse Sans, MD  ROS  Constitutional: Denies any fever or chills Gastrointestinal: No reported hemesis, hematochezia, vomiting, or acute GI distress Musculoskeletal: Denies any acute onset joint swelling, redness, loss of ROM, or weakness Neurological: No reported episodes of acute onset apraxia, aphasia, dysarthria, agnosia, amnesia, paralysis, loss of coordination, or loss of consciousness  Allergies  Kimberly Cowan is allergic to morphine and related, penicillins, latex, and baclofen.  PFSH  Drug: Ms. Vondra  reports no history of drug use. Alcohol:  reports no history of alcohol use. Tobacco:  reports that she has never smoked. She has never used smokeless tobacco. Medical:  has a past medical history of Allergy (1999), Anemia, Anxiety, Arm pain, left (05/17/2018), Arthritis, Bilateral lumbar radiculopathy, Cellulitis of abdominal wall (03/03/2021), Chronic back pain, Chronic GERD (03/03/2021), Clinical depression (04/04/2012), Depression, DVT (deep venous thrombosis) ([place]), Encounter  for screening colonoscopy, Essential (primary) hypertension (06/04/2020), Glaucoma, Headache(784.0), Major depressive disorder with single episode, in full remission ([place]) (08/15/2018), Malfunction of jejunostomy tube ([place]) (02/13/2021), Ovarian cyst, Pneumonia, Pulmonary embolism ([place]) (05/2017), S/P laparoscopic cholecystectomy (02/07/2021), and Sleep apnea. Surgical: Ms. Bores  has a past surgical history that includes Tonsillectomy; Gastric bypass (2005); Reduction mammaplasty (Bilateral); Spinal cord stimulator insertion (N/A, 03/20/2016); Abdominal hysterectomy (10/2006); Laparoscopic salpingo oophorectomy (Bilateral, 10/05/2016); Back surgery (2014); Back surgery (2009); Colonoscopy with propofol (N/A, 04/12/2018); Cesarean section (1989 1995); Spine  surgery (2009 2014 2017); Knee arthroscopy with lateral menisectomy (Left, 05/27/2017); Knee arthroscopy with lateral release (Left, 05/27/2017); Bariatric Surgery (07/2020); and Cholecystectomy (01/2021). Family: family history includes Arthritis in her father, mother, sister, and sister; Cancer in her father and maternal aunt; Diabetes in her maternal aunt, maternal grandmother, mother, and sister; Hypertension in her father, mother, and sister; Obesity in her daughter, father, mother, sister, and sister; Varicose Veins in her mother.  Constitutional Exam  General appearance: Well nourished, well developed, and well hydrated. In no apparent acute distress Vitals:   05/07/22 1449  BP: (!) 135/96  Pulse: 76  Resp: 18  Temp: (!) 97 F (36.1 C)  SpO2: 100%  Weight: 236 lb (107 kg)  Height: 5' 7"  (1.702 m)   BMI Assessment: Estimated body mass index is 36.96 kg/m as calculated from the following:   Height as of this encounter: 5' 7"  (1.702 m).   Weight as of this encounter: 236 lb (107 kg).  BMI interpretation table: BMI level Category Range association with higher incidence of chronic pain  <18 kg/m2 Underweight   18.5-24.9 kg/m2 Ideal body weight   25-29.9 kg/m2 Overweight Increased incidence by 20%  30-34.9 kg/m2 Obese (Class I) Increased incidence by 68%  35-39.9 kg/m2 Severe obesity (Class II) Increased incidence by 136%  >40 kg/m2 Extreme obesity (Class III) Increased incidence by 254%   Patient's current BMI Ideal Body weight  Body mass index is 36.96 kg/m. Ideal body weight: 61.6 kg (135 lb 12.9 oz) Adjusted ideal body weight: 79.8 kg (175 lb 14.1 oz)   BMI Readings from Last 4 Encounters:  05/07/22 36.96 kg/m  04/28/22 36.96 kg/m  03/15/22 36.96 kg/m  12/19/21 38.22 kg/m   Wt Readings from Last 4 Encounters:  05/07/22 236 lb (107 kg)  04/28/22 236 lb (107 kg)  03/15/22 236 lb (107 kg)  12/19/21 244 lb (110.7 kg)    Psych/Mental status: Alert, oriented x 3  (person, place, & time)       Eyes: PERLA Respiratory: No evidence of acute respiratory distress  Thoracic Spine Area Exam  Skin & Axial Inspection: Well healed scar from previous spine surgery detected Alignment: Symmetrical Functional ROM: Pain restricted ROM Stability: No instability detected Muscle Tone/Strength: Functionally intact. No obvious neuro-muscular anomalies detected. Sensory (Neurological): Neurogenic pain pattern Muscle strength & Tone: No palpable anomalies Lumbar Spine Area Exam  Skin & Axial Inspection: Well healed scar from previous spine surgery detected spinal cord stimulator IPG palpated along the left flank Alignment: Symmetrical Functional ROM: Pain restricted ROM       Stability: No instability detected Muscle Tone/Strength: Functionally intact. No obvious neuro-muscular anomalies detected. Sensory (Neurological): Dermatomal pain pattern   Gait & Posture Assessment  Ambulation: Limited Gait: Antalgic gait (limping) Posture: Difficulty standing up straight, due to pain  Lower Extremity Exam      Side: Right lower extremity   Side: Left lower extremity  Stability: No instability observed  Stability: No instability observed          Skin & Extremity Inspection: Skin color, temperature, and hair growth are WNL. No peripheral edema or cyanosis. No masses, redness, swelling, asymmetry, or associated skin lesions. No contractures.   Skin & Extremity Inspection: Skin color, temperature, and hair growth are WNL. No peripheral edema or cyanosis. No masses, redness, swelling, asymmetry, or associated skin lesions. No contractures.  Functional ROM: Unrestricted ROM                   Functional ROM: Unrestricted ROM                  Muscle Tone/Strength: Functionally intact. No obvious neuro-muscular anomalies detected.   Muscle Tone/Strength: Functionally intact. No obvious neuro-muscular anomalies detected.  Sensory (Neurological): Musculoskeletal pain pattern          Sensory (Neurological): Musculoskeletal pain pattern        DTR: Patellar: deferred today Achilles: deferred today Plantar: deferred today   DTR: Patellar: deferred today Achilles: deferred today Plantar: deferred today  Palpation: No palpable anomalies   Palpation: No palpable anomalies     Assessment & Plan  Primary Diagnosis & Pertinent Problem List: The primary encounter diagnosis was Failed back surgical syndrome. Diagnoses of Spinal cord stimulator status [employer], implanted 2017), Chronic radicular lumbar pain, History of lumbar fusion (L5-S1 PSIF), Encounter for long-term opiate analgesic use, Chronic pain syndrome, and Pain management contract signed were also pertinent to this visit.  Visit Diagnosis: 1. Failed back surgical syndrome   2. Spinal cord stimulator status [employer], implanted 2017)   3. Chronic radicular lumbar pain   4. History of lumbar fusion (L5-S1 PSIF)   5. Encounter for long-term opiate analgesic use   6. Chronic pain syndrome   7. Pain management contract signed    Problems updated and reviewed during this visit: Problem  Pain Management Contract Signed    Plan of Care  Pharmacotherapy (Medications Ordered): Meds ordered this encounter  Medications   HYDROcodone-acetaminophen (NORCO) 7.5-325 MG tablet    Sig: Take 1 tablet by mouth at bedtime as needed for severe pain. Must last 30 days.    Dispense:  30 tablet    Refill:  0    Chronic Pain: STOP Act (Not applicable) Fill 1 day early if closed on refill date. Avoid benzodiazepines within 8 hours of opioids      Pharmacological management options:  Opioid Analgesics: Hydrocodone 7.5 mg nightly as needed  Membrane stabilizer: Adequate regimen gabapentin 600 mg nightly  Muscle relaxant: Adequate regimen Flexeril 10 mg daily as needed  NSAID: To be determined at a later time  Other analgesic(s): To be determined at a later time          Provider-requested  follow-up: Return in about 4 weeks (around 06/04/2022) for Medication Management, in person. Recent Visits Date Type Provider Dept  04/28/22 Office Visit Gillis Santa, MD [facility]  Showing recent visits within past 90 days and meeting all other requirements Today's Visits Date Type Provider Dept  05/07/22 Office Visit Gillis Santa, MD [facility]  Showing today's visits and meeting all other requirements Future Appointments Date Type Provider Dept  05/26/22 Appointment Gillis Santa, MD [facility]  Showing future appointments within next 90 days and meeting all other requirements  Primary Care Physician: Glean Hess, MD Note by: Gillis Santa, MD Date: 05/07/2022; Time: 3:14 PM

## 2022-05-08 ENCOUNTER — Other Ambulatory Visit: Payer: Self-pay | Admitting: Internal Medicine

## 2022-05-08 DIAGNOSIS — B360 Pityriasis versicolor: Secondary | ICD-10-CM

## 2022-05-08 NOTE — Telephone Encounter (Signed)
Requested medication (s) are due for refill today: yes  Requested medication (s) are on the active medication list: yes  Last refill:  03/21/21  Future visit scheduled: yes  Notes to clinic:  Unable to refill per protocol, cannot delegate.     Requested Prescriptions  Pending Prescriptions Disp Refills   ketoconazole (NIZORAL) 2 % shampoo [Pharmacy Med Name: KETOCONAZOLE 2% SHAMPOO 120ML] 120 mL 1    Sig: APPLY TOPICALLY DAILY AS NEEDED FOR SKIN FUNGUS ON NECK/BACK     Not Delegated - Over the Counter: OTC 2 Failed - 05/08/2022  9:58 AM      Failed - This refill cannot be delegated      Passed - Valid encounter within last 12 months    Recent Outpatient Visits           6 months ago Diarrhea of presumed infectious origin   [facility] Glean Hess, MD   7 months ago Annual physical exam   [facility] Glean Hess, MD   1 year ago Prediabetes   [facility] Glean Hess, MD   1 year ago Prediabetes   [facility] Glean Hess, MD   1 year ago Acute non-recurrent maxillary sinusitis   [facility] Glean Hess, MD       Future Appointments             In 5 months Army Melia Jesse Sans, MD [facility], [facility]

## 2022-05-08 NOTE — Telephone Encounter (Signed)
Requested medication (s) are due for refill today: yes  Requested medication (s) are on the active medication list: yes  Last refill:  02/02/22  Future visit scheduled: yes  Notes to clinic:  Unable to refill per protocol, cannot delegate. Pt was future OV scheduled, routing for approval.     Requested Prescriptions  Pending Prescriptions Disp Refills   temazepam (RESTORIL) 15 MG capsule [Pharmacy Med Name: TEMAZEPAM '15MG'$  CAPSULES] 30 capsule     Sig: TAKE 1 CAPSULE(15 MG) BY MOUTH AT BEDTIME AS NEEDED FOR SLEEP     Not Delegated - Psychiatry: Anxiolytics/Hypnotics 2 Failed - 05/07/2022  9:33 PM      Failed - This refill cannot be delegated      Failed - Urine Drug Screen completed in last 360 days      Failed - Valid encounter within last 6 months    Recent Outpatient Visits           6 months ago Diarrhea of presumed infectious origin   [facility] Glean Hess, MD   7 months ago Annual physical exam   [facility] Glean Hess, MD   1 year ago Prediabetes   [facility] Glean Hess, MD   1 year ago Prediabetes   [facility] Glean Hess, MD   1 year ago Acute non-recurrent maxillary sinusitis   [facility] Glean Hess, MD       Future Appointments             In 5 months Army Melia Jesse Sans, MD [facility], [place] - Patient is not pregnant

## 2022-05-08 NOTE — Telephone Encounter (Signed)
Please review . Last office visit 09/2021.  KP

## 2022-05-11 ENCOUNTER — Telehealth: Payer: Self-pay

## 2022-05-11 NOTE — Telephone Encounter (Signed)
The hydrocodone was approved. They will send another fax. There was a glitch saying not approved but it really was.

## 2022-05-26 ENCOUNTER — Ambulatory Visit
Payer: BC Managed Care – PPO | Attending: Student in an Organized Health Care Education/Training Program | Admitting: Student in an Organized Health Care Education/Training Program

## 2022-05-26 DIAGNOSIS — M961 Postlaminectomy syndrome, not elsewhere classified: Secondary | ICD-10-CM

## 2022-05-26 NOTE — Progress Notes (Signed)
Transportation issue, appt rescheduled

## 2022-06-03 ENCOUNTER — Other Ambulatory Visit: Payer: Self-pay

## 2022-06-03 ENCOUNTER — Ambulatory Visit
Payer: BC Managed Care – PPO | Attending: Student in an Organized Health Care Education/Training Program | Admitting: Student in an Organized Health Care Education/Training Program

## 2022-06-03 ENCOUNTER — Encounter: Payer: Self-pay | Admitting: Student in an Organized Health Care Education/Training Program

## 2022-06-03 VITALS — BP 143/85 | HR 66 | Temp 97.1°F | Resp 16 | Ht 67.0 in | Wt 236.0 lb

## 2022-06-03 DIAGNOSIS — M5416 Radiculopathy, lumbar region: Secondary | ICD-10-CM | POA: Diagnosis not present

## 2022-06-03 DIAGNOSIS — G894 Chronic pain syndrome: Secondary | ICD-10-CM | POA: Diagnosis not present

## 2022-06-03 DIAGNOSIS — Z79891 Long term (current) use of opiate analgesic: Secondary | ICD-10-CM | POA: Diagnosis not present

## 2022-06-03 DIAGNOSIS — M961 Postlaminectomy syndrome, not elsewhere classified: Secondary | ICD-10-CM | POA: Insufficient documentation

## 2022-06-03 DIAGNOSIS — Z9689 Presence of other specified functional implants: Secondary | ICD-10-CM | POA: Insufficient documentation

## 2022-06-03 DIAGNOSIS — Z981 Arthrodesis status: Secondary | ICD-10-CM | POA: Diagnosis not present

## 2022-06-03 DIAGNOSIS — G8929 Other chronic pain: Secondary | ICD-10-CM | POA: Diagnosis not present

## 2022-06-03 MED ORDER — HYDROCODONE-ACETAMINOPHEN 7.5-325 MG PO TABS: 1.0000 | ORAL_TABLET | Freq: Every evening | ORAL | 0 refills | Status: DC | PRN

## 2022-06-03 MED ORDER — HYDROCODONE-ACETAMINOPHEN 7.5-325 MG PO TABS: 1.0000 | ORAL_TABLET | Freq: Every evening | ORAL | 0 refills | Status: AC | PRN

## 2022-06-03 MED ORDER — GABAPENTIN 300 MG PO CAPS
600.0000 mg | ORAL_CAPSULE | Freq: Every day | ORAL | 5 refills | Status: DC
Start: 2022-06-03 — End: 2022-07-28

## 2022-06-03 NOTE — Progress Notes (Signed)
PROVIDER NOTE: Information contained herein reflects review and annotations entered in association with encounter. Interpretation of such information and data should be left to medically-trained personnel. Information provided to patient can be located elsewhere in the medical record under "Patient Instructions". Document created using STT-dictation technology, any transcriptional errors that may result from process are unintentional.    Patient: Kimberly Cowan  Service Category: E/M  Provider: Gillis Santa, MD  DOB: 09/15/1966  DOS: 06/03/2022  Referring Provider: Glean Hess, MD  MRN: [id number]  Specialty: Interventional Pain Management  PCP: Glean Hess, MD  Type: Established Patient  Setting: Ambulatory outpatient    Location: Office  Delivery: Face-to-face     HPI  Kimberly Cowan, a 56 y.o. 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 Kimberly Cowan primary complain today is Back Pain (Lumbar bilateral ) Last encounter: My last encounter with her was on 05/26/2022. Pertinent problems: Kimberly Cowan has Failed back surgical syndrome; Spinal stenosis of lumbar region; Chronic depression; Spinal cord stimulator status [employer], implanted 2017); History of lumbar fusion (L5-S1 PSIF); Encounter for long-term opiate analgesic use; Chronic pain syndrome; and Pain management contract signed on their pertinent problem list. Pain Assessment: Severity of Chronic pain is reported as a 7 /10. Location: Back Lower, Left, Right/down both legs. Onset: More than a month ago. Quality: Discomfort, Constant, Sharp, Stabbing, Numbness, Tingling. Timing: Constant. Modifying factor(s): stimulator, medications, rest. Vitals:  height is _0  (1.702 m) and weight is 236 lb (107 kg). Her temporal temperature is 97.1 F (36.2 C) (abnormal). Her blood pressure is 143/85 (abnormal) and her pulse is 66. Her respiration is 16 and  oxygen saturation is 99%.   Reason for encounter: medication management.   No change in medical history since last visit.  Patient's pain is at baseline.  Patient continues multimodal pain regimen as prescribed.  States that it provides pain relief and improvement in functional status.  Continues to utilize a spinal cord stimulator.   05/07/22: Second patient visit today.  She has completed her urine toxicology screen which is appropriate.  I will have her sign pain contract and take her on for chronic pain management.  I will send in a prescription for hydrocodone 7.5 mg nightly.  I will see her back in 4 weeks for medication management.   Please see initial clinic visit HPI below [name] is a pleasant 56 year old female with a history of L4-S1 spinal fusion with [employer] spinal cord stimulator in place who is hoping to establish with pain management as her previous pain provider left [place] [facility] and is no longer excepting new patients.  She states that she has done physical therapy in the past tried various medications as well as injections with limited response.  She had her spinal cord stimulator implanted in 2017 with [place] which was beneficial for her and allowed her to wean her hydrocodone to just once at night.  She states that she is able to manage with 7.5 mg nightly.  She is hoping to have this continued.  She also has restless leg syndrome and obesity.  She also has obstructive sleep apnea.  Given that she is on low-dose and has been compliant with previous pain clinic protocol, I will have her obtain baseline urine toxicology screen which is customary for new patients and I will see her back in approximately 7 to 10 days to sign pain contract and take her on for medication  Pharmacotherapy Assessment  Analgesic:  Hydrocodone 7.5 mg nightly as needed  Monitoring: [place] PMP: PDMP reviewed during this encounter.       Pharmacotherapy: No side-effects or adverse  reactions reported. Compliance: No problems identified. Effectiveness: Clinically acceptable.  Janett Billow, RN  06/03/2022  1:07 PM  Sign when Signing Visit Nursing Pain Medication Assessment:  Safety precautions to be maintained throughout the outpatient stay will include: orient to surroundings, keep bed in low position, maintain call bell within reach at all times, provide assistance with transfer out of bed and ambulation.  Medication Inspection Compliance: Pill count conducted under aseptic conditions, in front of the patient. Neither the pills nor the bottle was removed from the patient's sight at any time. Once count was completed pills were immediately returned to the patient in their original bottle.  Medication: Hydrocodone/APAP Pill/Patch Count:  3 of 30 pills remain Pill/Patch Appearance: Markings consistent with prescribed medication Bottle Appearance: Standard pharmacy container. Clearly labeled. Filled Date: 08 / 10 / 2023 Last Medication intake:  Yesterday    No results found for: "CBDTHCR" No results found for: "D8THCCBX" No results found for: "D9THCCBX"  UDS:  Summary  Date Value Ref Range Status  04/28/2022 Note  Final    Comment:    ==================================================================== Compliance Drug Analysis, Ur ==================================================================== Test                             Result       Flag       Units  Drug Present and Declared for Prescription Verification   Oxazepam                       641          EXPECTED   ng/mg creat   Temazepam                      >2532        EXPECTED   ng/mg creat    Oxazepam and temazepam are expected metabolites of diazepam.    Oxazepam is also an expected metabolite of other benzodiazepine    drugs, including chlordiazepoxide, prazepam, clorazepate, halazepam,    and temazepam.  Oxazepam and temazepam are available as scheduled    prescription medications.     Gabapentin                     PRESENT      EXPECTED   Cyclobenzaprine                PRESENT      EXPECTED   Desmethylcyclobenzaprine       PRESENT      EXPECTED    Desmethylcyclobenzaprine is an expected metabolite of    cyclobenzaprine.    Acetaminophen                  PRESENT      EXPECTED  Drug Absent but Declared for Prescription Verification   Hydrocodone                    Not Detected UNEXPECTED ng/mg creat   Methocarbamol                  Not Detected UNEXPECTED ==================================================================== Test                      Result  Flag   Units      Ref Range   Creatinine              79               mg/dL      >=20 ==================================================================== Declared Medications:  The flagging and interpretation on this report are based on the  following declared medications.  Unexpected results may arise from  inaccuracies in the declared medications.   **Note: The testing scope of this panel includes these medications:   Cyclobenzaprine (Flexeril)  Gabapentin (Neurontin)  Hydrocodone (Norco)  Methocarbamol (Robaxin)  Temazepam (Restoril)   **Note: The testing scope of this panel does not include small to  moderate amounts of these reported medications:   Acetaminophen (Norco)   **Note: The testing scope of this panel does not include the  following reported medications:   Fluticasone (Flonase)  Furosemide (Lasix)  Ketoconazole (Nizoral)  Latanoprost (Xalatan)  Levocetirizine (Xyzal)  Multivitamin  Pantoprazole (Protonix)  Rizatriptan (Maxalt)  Scopolamine  Vitamin B12 ==================================================================== For clinical consultation, please call [phone]. ====================================================================       ROS  Constitutional: Denies any fever or chills Gastrointestinal: No reported hemesis, hematochezia, vomiting, or acute GI  distress Musculoskeletal: Denies any acute onset joint swelling, redness, loss of ROM, or weakness Neurological: No reported episodes of acute onset apraxia, aphasia, dysarthria, agnosia, amnesia, paralysis, loss of coordination, or loss of consciousness  Medication Review  HYDROcodone-acetaminophen, Multiple Vitamins-Minerals, cetirizine, cyanocobalamin, cyclobenzaprine, diphenoxylate-atropine, fluticasone, furosemide, gabapentin, ketoconazole, latanoprost, levocetirizine, pantoprazole, potassium chloride, rizatriptan, scopolamine, and temazepam  History Review  Allergy: Kimberly Cowan is allergic to morphine and related, penicillins, latex, and baclofen. Drug: Kimberly Cowan  reports no history of drug use. Alcohol:  reports no history of alcohol use. Tobacco:  reports that she has never smoked. She has never used smokeless tobacco. Social: Kimberly Cowan  reports that she has never smoked. She has never used smokeless tobacco. She reports that she does not drink alcohol and does not use drugs. Medical:  has a past medical history of Allergy (1999), Anemia, Anxiety, Arm pain, left (05/17/2018), Arthritis, Bilateral lumbar radiculopathy, Cellulitis of abdominal wall (03/03/2021), Chronic back pain, Chronic GERD (03/03/2021), Clinical depression (04/04/2012), Depression, DVT (deep venous thrombosis) ([place]), Encounter for screening colonoscopy, Essential (primary) hypertension (06/04/2020), Glaucoma, Headache(784.0), Major depressive disorder with single episode, in full remission ([place]) (08/15/2018), Malfunction of jejunostomy tube ([place]) (02/13/2021), Ovarian cyst, Pneumonia, Pulmonary embolism ([place]) (05/2017), S/P laparoscopic cholecystectomy (02/07/2021), and Sleep apnea. Surgical: Ms. Petralia  has a past surgical history that includes Tonsillectomy; Gastric bypass (2005); Reduction mammaplasty (Bilateral); Spinal cord stimulator insertion (N/A, 03/20/2016); Abdominal hysterectomy (10/2006);  Laparoscopic salpingo oophorectomy (Bilateral, 10/05/2016); Back surgery (2014); Back surgery (2009); Colonoscopy with propofol (N/A, 04/12/2018); Cesarean section (1989 1995); Spine surgery (2009 2014 2017); Knee arthroscopy with lateral menisectomy (Left, 05/27/2017); Knee arthroscopy with lateral release (Left, 05/27/2017); Bariatric Surgery (07/2020); and Cholecystectomy (01/2021). Family: family history includes Arthritis in her father, mother, sister, and sister; Cancer in her father and maternal aunt; Diabetes in her maternal aunt, maternal grandmother, mother, and sister; Hypertension in her father, mother, and sister; Obesity in her daughter, father, mother, sister, and sister; Varicose Veins in her mother.  Laboratory Chemistry Profile   Renal Lab Results  Component Value Date   BUN 5 (L) 12/16/2021   CREATININE 0.62 12/16/2021   BCR 8 (L) 10/02/2021   GFRAA 100 07/24/2020   GFRNONAA >60 12/16/2021    Hepatic Lab Results  Component  Value Date   AST 20 10/02/2021   ALT 13 10/02/2021   ALBUMIN 3.5 (L) 10/02/2021   ALKPHOS 101 10/02/2021   LIPASE 25 08/31/2016    Electrolytes Lab Results  Component Value Date   NA 143 12/16/2021   K 3.2 (L) 12/16/2021   CL 107 12/16/2021   CALCIUM 9.6 12/16/2021   MG 1.9 03/03/2021    Bone No results found for: "VD25OH", "VD125OH2TOT", "AV4098JX9", "JY7829FA2", "25OHVITD1", "25OHVITD2", "25OHVITD3", "TESTOFREE", "TESTOSTERONE"  Inflammation (CRP: Acute Phase) (ESR: Chronic Phase) Lab Results  Component Value Date   LATICACIDVEN 1.5 03/03/2021         Note: Above Lab results reviewed.  Recent Imaging Review  MM 3D SCREEN BREAST BILATERAL CLINICAL DATA:  Screening.  EXAM: DIGITAL SCREENING BILATERAL MAMMOGRAM WITH TOMOSYNTHESIS AND CAD  TECHNIQUE: Bilateral screening digital craniocaudal and mediolateral oblique mammograms were obtained. Bilateral screening digital breast tomosynthesis was performed. The images were evaluated  with computer-aided detection.  COMPARISON:  Previous exam(s).  ACR Breast Density Category b: There are scattered areas of fibroglandular density.  FINDINGS: There are no findings suspicious for malignancy.  IMPRESSION: No mammographic evidence of malignancy. A result letter of this screening mammogram will be mailed directly to the patient.  RECOMMENDATION: Screening mammogram in one year. (Code:SM-B-01Y)  BI-RADS CATEGORY  1: Negative.  Electronically Signed   By: Kristopher Oppenheim M.D.   On: 01/05/2022 13:18 Note: Reviewed        Physical Exam  General appearance: Well nourished, well developed, and well hydrated. In no apparent acute distress Mental status: Alert, oriented x 3 (person, place, & time)       Respiratory: No evidence of acute respiratory distress Eyes: PERLA Vitals: BP (!) 143/85 (BP Location: Left Arm, Patient Position: Sitting, Cuff Size: Large)   Pulse 66   Temp (!) 97.1 F (36.2 C) (Temporal)   Resp 16   Ht _0  (1.702 m)   Wt 236 lb (107 kg)   SpO2 99%   BMI 36.96 kg/m  BMI: Estimated body mass index is 36.96 kg/m as calculated from the following:   Height as of this encounter: _1  (1.702 m).   Weight as of this encounter: 236 lb (107 kg). Ideal: Ideal body weight: 61.6 kg (135 lb 12.9 oz) Adjusted ideal body weight: 79.8 kg (175 lb 14.1 oz)    Thoracic Spine Area Exam  Skin & Axial Inspection: Well healed scar from previous spine surgery detected Alignment: Symmetrical Functional ROM: Pain restricted ROM Stability: No instability detected Muscle Tone/Strength: Functionally intact. No obvious neuro-muscular anomalies detected. Sensory (Neurological): Neurogenic pain pattern Muscle strength & Tone: No palpable anomalies Lumbar Spine Area Exam  Skin & Axial Inspection: Well healed scar from previous spine surgery detected spinal cord stimulator IPG  along the left flank Alignment: Symmetrical Functional ROM: Pain restricted ROM        Stability: No instability detected Muscle Tone/Strength: Functionally intact. No obvious neuro-muscular anomalies detected. Sensory (Neurological): Dermatomal pain pattern   Gait & Posture Assessment  Ambulation: Limited Gait: Antalgic gait (limping) Posture: Difficulty standing up straight, due to pain  Lower Extremity Exam      Side: Right lower extremity   Side: Left lower extremity  Stability: No instability observed           Stability: No instability observed          Skin & Extremity Inspection: Skin color, temperature, and hair growth are WNL. No peripheral edema or cyanosis. No masses, redness,  swelling, asymmetry, or associated skin lesions. No contractures.   Skin & Extremity Inspection: Skin color, temperature, and hair growth are WNL. No peripheral edema or cyanosis. No masses, redness, swelling, asymmetry, or associated skin lesions. No contractures.  Functional ROM: Unrestricted ROM                   Functional ROM: Unrestricted ROM                  Muscle Tone/Strength: Functionally intact. No obvious neuro-muscular anomalies detected.   Muscle Tone/Strength: Functionally intact. No obvious neuro-muscular anomalies detected.  Sensory (Neurological): Musculoskeletal pain pattern         Sensory (Neurological): Musculoskeletal pain pattern        DTR: Patellar: deferred today Achilles: deferred today Plantar: deferred today   DTR: Patellar: deferred today Achilles: deferred today Plantar: deferred today  Palpation: No palpable anomalies   Palpation: No palpable anomalies       Assessment   Diagnosis Status  1. Failed back surgical syndrome   2. Spinal cord stimulator status [employer], implanted 2017)   3. Chronic radicular lumbar pain   4. History of lumbar fusion (L5-S1 PSIF)   5. Encounter for long-term opiate analgesic use   6. Chronic pain syndrome    Controlled Controlled Controlled   Updated Problems: Problem  Pain Management Contract Signed   Spinal cord stimulator status [employer], implanted 2017)  History of lumbar fusion (L5-S1 PSIF)  Encounter for Long-Term Opiate Analgesic Use  Chronic Pain Syndrome  Failed Back Surgical Syndrome  Chronic Depression  Spinal Stenosis of Lumbar Region    Plan of Care    Kimberly Cowan has a current medication list which includes the following long-term medication(s): fluticasone, furosemide, levocetirizine, pantoprazole, rizatriptan, temazepam, and gabapentin.  Pharmacotherapy (Medications Ordered): Meds ordered this encounter  Medications   HYDROcodone-acetaminophen (NORCO) 7.5-325 MG tablet    Sig: Take 1 tablet by mouth at bedtime as needed for severe pain. Must last 30 days.    Dispense:  30 tablet    Refill:  0    Chronic Pain: STOP Act (Not applicable) Fill 1 day early if closed on refill date. Avoid benzodiazepines within 8 hours of opioids   HYDROcodone-acetaminophen (NORCO) 7.5-325 MG tablet    Sig: Take 1 tablet by mouth at bedtime as needed for severe pain. Must last 30 days.    Dispense:  30 tablet    Refill:  0    Chronic Pain: STOP Act (Not applicable) Fill 1 day early if closed on refill date. Avoid benzodiazepines within 8 hours of opioids   gabapentin (NEURONTIN) 300 MG capsule    Sig: Take 2 capsules (600 mg total) by mouth at bedtime.    Dispense:  60 capsule    Refill:  5     Follow-up plan:   Return in about 8 weeks (around 07/29/2022) for Medication Management, in person.    I discussed the assessment and treatment plan with the patient. The patient was provided an opportunity to ask questions and all were answered. The patient agreed with the plan and demonstrated an understanding of the instructions.  Patient advised to call back or seek an in-person evaluation if the symptoms or condition worsens.  Duration of encounter: 2mnutes.  Total time on encounter, as per AMA guidelines included both the face-to-face and  non-face-to-face time personally spent by the physician and/or other qualified health care professional(s) on the day of the encounter (includes  time in activities that require the physician or other qualified health care professional and does not include time in activities normally performed by clinical staff). Physician's time may include the following activities when performed: preparing to see the patient (eg, review of tests, pre-charting review of records) obtaining and/or reviewing separately obtained history performing a medically appropriate examination and/or evaluation counseling and educating the patient/family/caregiver ordering medications, tests, or procedures referring and communicating with other health care professionals (when not separately reported) documenting clinical information in the electronic or other health record independently interpreting results (not separately reported) and communicating results to the patient/ family/caregiver care coordination (not separately reported)  Note by: Gillis Santa, MD Date: 06/03/2022; Time: 1:26 PM

## 2022-06-03 NOTE — Progress Notes (Signed)
Nursing Pain Medication Assessment:  Safety precautions to be maintained throughout the outpatient stay will include: orient to surroundings, keep bed in low position, maintain call bell within reach at all times, provide assistance with transfer out of bed and ambulation.  Medication Inspection Compliance: Pill count conducted under aseptic conditions, in front of the patient. Neither the pills nor the bottle was removed from the patient's sight at any time. Once count was completed pills were immediately returned to the patient in their original bottle.  Medication: Hydrocodone/APAP Pill/Patch Count:  3 of 30 pills remain Pill/Patch Appearance: Markings consistent with prescribed medication Bottle Appearance: Standard pharmacy container. Clearly labeled. Filled Date: 08 / 10 / 2023 Last Medication intake:  Yesterday

## 2022-06-09 DIAGNOSIS — K9089 Other intestinal malabsorption: Secondary | ICD-10-CM | POA: Diagnosis not present

## 2022-06-09 DIAGNOSIS — R198 Other specified symptoms and signs involving the digestive system and abdomen: Secondary | ICD-10-CM | POA: Diagnosis not present

## 2022-07-06 ENCOUNTER — Encounter: Payer: Self-pay | Admitting: Internal Medicine

## 2022-07-06 NOTE — Telephone Encounter (Signed)
Please review.  KP

## 2022-07-07 ENCOUNTER — Telehealth: Payer: Self-pay | Admitting: Student in an Organized Health Care Education/Training Program

## 2022-07-07 NOTE — Telephone Encounter (Signed)
Santiago Glad from [facility] called to get pre autho for patient medication. Hydrocodone 7.5 mg. Santiago Glad stated that this is an urgent matter due to that's is due on 07-08-22. Call back number is [phone] ref# is [id number]. Thanks

## 2022-07-07 NOTE — Telephone Encounter (Signed)
I called and spoke to the [facility] rep and they state they have all the info needed and willlet [place] know the result as soon as they get it.

## 2022-07-07 NOTE — Telephone Encounter (Signed)
error 

## 2022-07-08 ENCOUNTER — Telehealth: Payer: Self-pay | Admitting: Student in an Organized Health Care Education/Training Program

## 2022-07-08 NOTE — Telephone Encounter (Signed)
Patient received a notice of denial on her medications again, insurance is saying they will only approve a 7 day supply. Please assist patient

## 2022-07-08 NOTE — Telephone Encounter (Signed)
Resent PA with notes today. Patient notified.

## 2022-07-23 ENCOUNTER — Encounter: Payer: BC Managed Care – PPO | Admitting: Student in an Organized Health Care Education/Training Program

## 2022-07-28 ENCOUNTER — Ambulatory Visit
Payer: BC Managed Care – PPO | Attending: Student in an Organized Health Care Education/Training Program | Admitting: Student in an Organized Health Care Education/Training Program

## 2022-07-28 ENCOUNTER — Encounter: Payer: Self-pay | Admitting: Student in an Organized Health Care Education/Training Program

## 2022-07-28 VITALS — BP 119/84 | HR 79 | Temp 98.3°F | Resp 16 | Ht 67.0 in | Wt 234.0 lb

## 2022-07-28 DIAGNOSIS — Z9689 Presence of other specified functional implants: Secondary | ICD-10-CM | POA: Diagnosis not present

## 2022-07-28 DIAGNOSIS — G894 Chronic pain syndrome: Secondary | ICD-10-CM | POA: Insufficient documentation

## 2022-07-28 DIAGNOSIS — Z0289 Encounter for other administrative examinations: Secondary | ICD-10-CM | POA: Insufficient documentation

## 2022-07-28 DIAGNOSIS — G8929 Other chronic pain: Secondary | ICD-10-CM | POA: Insufficient documentation

## 2022-07-28 DIAGNOSIS — Z981 Arthrodesis status: Secondary | ICD-10-CM | POA: Insufficient documentation

## 2022-07-28 DIAGNOSIS — M5416 Radiculopathy, lumbar region: Secondary | ICD-10-CM | POA: Insufficient documentation

## 2022-07-28 DIAGNOSIS — Z79891 Long term (current) use of opiate analgesic: Secondary | ICD-10-CM | POA: Insufficient documentation

## 2022-07-28 DIAGNOSIS — M961 Postlaminectomy syndrome, not elsewhere classified: Secondary | ICD-10-CM | POA: Insufficient documentation

## 2022-07-28 MED ORDER — HYDROCODONE-ACETAMINOPHEN 7.5-325 MG PO TABS: 1.0000 | ORAL_TABLET | Freq: Every evening | ORAL | 0 refills | Status: AC | PRN

## 2022-07-28 MED ORDER — HYDROCODONE-ACETAMINOPHEN 7.5-325 MG PO TABS: 1.0000 | ORAL_TABLET | Freq: Every evening | ORAL | 0 refills | Status: DC | PRN

## 2022-07-28 MED ORDER — GABAPENTIN 300 MG PO CAPS: 600.0000 mg | ORAL_CAPSULE | Freq: Every day | ORAL | 5 refills | Status: DC

## 2022-07-28 NOTE — Progress Notes (Signed)
PROVIDER NOTE: Information contained herein reflects review and annotations entered in association with encounter. Interpretation of such information and data should be left to medically-trained personnel. Information provided to patient can be located elsewhere in the medical record under "Patient Instructions". Document created using STT-dictation technology, any transcriptional errors that may result from process are unintentional.    Patient: Kimberly Cowan  Service Category: E/M  Provider: Gillis Santa, MD  DOB: 12/30/1965  DOS: 07/28/2022  Referring Provider: Glean Hess, MD  MRN: [id number]  Specialty: Interventional Pain Management  PCP: Glean Hess, MD  Type: Established Patient  Setting: Ambulatory outpatient    Location: Office  Delivery: Face-to-face     HPI  Kimberly Cowan, a 56 y.o. 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 [name] primary complain today is Back Pain (lower) Last encounter: My last encounter with her was on 06/03/2022 Pertinent problems: Ms. Gaetz has Failed back surgical syndrome; Spinal stenosis of lumbar region; Chronic depression; Spinal cord stimulator status [employer], implanted 2017); History of lumbar fusion (L5-S1 PSIF); Encounter for long-term opiate analgesic use; Chronic pain syndrome; and Pain management contract signed on their pertinent problem list. Pain Assessment: Severity of Chronic pain is reported as a 7 /10. Location: Back Lower/hips/buttocks bilateral down back of legs to feet effects middle three two, left side worse. Onset: More than a month ago. Quality: Aching, Constant, Discomfort, Nagging, Tiring. Timing: Constant. Modifying factor(s): stimulator, rest, streching. Vitals:  height is _0  (1.702 m) and weight is 234 lb (106.1 kg). Her temperature is 98.3 F (36.8 C). Her blood pressure is 119/84 and her pulse is 79. Her respiration is 16  and oxygen saturation is 100%.   Reason for encounter: medication management.   No change in medical history since last visit.  Patient's pain is at baseline.  Patient continues multimodal pain regimen as prescribed.  States that it provides pain relief and improvement in functional status.  Continues to utilize a spinal cord stimulator.   05/07/22: Second patient visit today.  She has completed her urine toxicology screen which is appropriate.  I will have her sign pain contract and take her on for chronic pain management.  I will send in a prescription for hydrocodone 7.5 mg nightly.  I will see her back in 4 weeks for medication management.   Please see initial clinic visit HPI below Kimberly Cowan is a pleasant 56 year old female with a history of L4-S1 spinal fusion with [employer] spinal cord stimulator in place who is hoping to establish with pain management as her previous pain provider left [place] [facility] and is no longer excepting new patients.  She states that she has done physical therapy in the past tried various medications as well as injections with limited response.  She had her spinal cord stimulator implanted in 2017 with [place] which was beneficial for her and allowed her to wean her hydrocodone to just once at night.  She states that she is able to manage with 7.5 mg nightly.  She is hoping to have this continued.  She also has restless leg syndrome and obesity.  She also has obstructive sleep apnea.  Given that she is on low-dose and has been compliant with previous pain clinic protocol, I will have her obtain baseline urine toxicology screen which is customary for new patients and I will see her back in approximately 7 to 10 days to sign pain contract and take  her on for medication   Pharmacotherapy Assessment  Analgesic:  Hydrocodone 7.5 mg nightly as needed  Monitoring:  PMP: PDMP reviewed during this encounter.       Pharmacotherapy: No side-effects or  adverse reactions reported. Compliance: No problems identified. Effectiveness: Clinically acceptable.  Ignatius Specking, RN  07/28/2022  2:24 PM  Sign when Signing Visit Nursing Pain Medication Assessment:  Safety precautions to be maintained throughout the outpatient stay will include: orient to surroundings, keep bed in low position, maintain call bell within reach at all times, provide assistance with transfer out of bed and ambulation.  Medication Inspection Compliance: Pill count conducted under aseptic conditions, in front of the patient. Neither the pills nor the bottle was removed from the patient's sight at any time. Once count was completed pills were immediately returned to the patient in their original bottle.  Medication: Hydrocodone/APAP Pill/Patch Count:  10 of 30 pills remain Pill/Patch Appearance: Markings consistent with prescribed medication Bottle Appearance: Standard pharmacy container. Clearly labeled. Filled Date: 10 / 009 / 2023 Last Medication intake:  Today  No results found for: "CBDTHCR" No results found for: "D8THCCBX" No results found for: "D9THCCBX"  UDS:  Summary  Date Value Ref Range Status  04/28/2022 Note  Final    Comment:    ==================================================================== Compliance Drug Analysis, Ur ==================================================================== Test                             Result       Flag       Units  Drug Present and Declared for Prescription Verification   Oxazepam                       641          EXPECTED   ng/mg creat   Temazepam                      >2532        EXPECTED   ng/mg creat    Oxazepam and temazepam are expected metabolites of diazepam.    Oxazepam is also an expected metabolite of other benzodiazepine    drugs, including chlordiazepoxide, prazepam, clorazepate, halazepam,    and temazepam.  Oxazepam and temazepam are available as scheduled    prescription medications.     Gabapentin                     PRESENT      EXPECTED   Cyclobenzaprine                PRESENT      EXPECTED   Desmethylcyclobenzaprine       PRESENT      EXPECTED    Desmethylcyclobenzaprine is an expected metabolite of    cyclobenzaprine.    Acetaminophen                  PRESENT      EXPECTED  Drug Absent but Declared for Prescription Verification   Hydrocodone                    Not Detected UNEXPECTED ng/mg creat   Methocarbamol                  Not Detected UNEXPECTED ==================================================================== Test  Result    Flag   Units      Ref Range   Creatinine              79               mg/dL      >=20 ==================================================================== Declared Medications:  The flagging and interpretation on this report are based on the  following declared medications.  Unexpected results may arise from  inaccuracies in the declared medications.   **Note: The testing scope of this panel includes these medications:   Cyclobenzaprine (Flexeril)  Gabapentin (Neurontin)  Hydrocodone (Norco)  Methocarbamol (Robaxin)  Temazepam (Restoril)   **Note: The testing scope of this panel does not include small to  moderate amounts of these reported medications:   Acetaminophen (Norco)   **Note: The testing scope of this panel does not include the  following reported medications:   Fluticasone (Flonase)  Furosemide (Lasix)  Ketoconazole (Nizoral)  Latanoprost (Xalatan)  Levocetirizine (Xyzal)  Multivitamin  Pantoprazole (Protonix)  Rizatriptan (Maxalt)  Scopolamine  Vitamin B12 ==================================================================== For clinical consultation, please call [phone]. ====================================================================       ROS  Constitutional: Denies any fever or chills Gastrointestinal: No reported hemesis, hematochezia, vomiting, or acute GI  distress Musculoskeletal: Denies any acute onset joint swelling, redness, loss of ROM, or weakness Neurological: No reported episodes of acute onset apraxia, aphasia, dysarthria, agnosia, amnesia, paralysis, loss of coordination, or loss of consciousness  Medication Review  HYDROcodone-acetaminophen, Multiple Vitamins-Minerals, cetirizine, cyanocobalamin, cyclobenzaprine, diphenoxylate-atropine, fluticasone, furosemide, gabapentin, ketoconazole, latanoprost, levocetirizine, potassium chloride, rizatriptan, scopolamine, and temazepam  History Review  Allergy: Ms. Fern is allergic to morphine and related, penicillins, latex, and baclofen. Drug: Ms. Hair  reports no history of drug use. Alcohol:  reports no history of alcohol use. Tobacco:  reports that she has never smoked. She has never used smokeless tobacco. Social: Ms. Mcgourty  reports that she has never smoked. She has never used smokeless tobacco. She reports that she does not drink alcohol and does not use drugs. Medical:  has a past medical history of Allergy (1999), Anemia, Anxiety, Arm pain, left (05/17/2018), Arthritis, Bilateral lumbar radiculopathy, Cellulitis of abdominal wall (03/03/2021), Chronic back pain, Chronic GERD (03/03/2021), Clinical depression (04/04/2012), Depression, DVT (deep venous thrombosis) ([place]), Encounter for screening colonoscopy, Essential (primary) hypertension (06/04/2020), Glaucoma, Headache(784.0), Major depressive disorder with single episode, in full remission ([place]) (08/15/2018), Malfunction of jejunostomy tube ([place]) (02/13/2021), Ovarian cyst, Pneumonia, Pulmonary embolism ([place]) (05/2017), S/P laparoscopic cholecystectomy (02/07/2021), and Sleep apnea. Surgical: Ms. Garton  has a past surgical history that includes Tonsillectomy; Gastric bypass (2005); Reduction mammaplasty (Bilateral); Spinal cord stimulator insertion (N/A, 03/20/2016); Abdominal hysterectomy (10/2006); Laparoscopic  salpingo oophorectomy (Bilateral, 10/05/2016); Back surgery (2014); Back surgery (2009); Colonoscopy with propofol (N/A, 04/12/2018); Cesarean section (1989 1995); Spine surgery (2009 2014 2017); Knee arthroscopy with lateral menisectomy (Left, 05/27/2017); Knee arthroscopy with lateral release (Left, 05/27/2017); Bariatric Surgery (07/2020); and Cholecystectomy (01/2021). Family: family history includes Arthritis in her father, mother, sister, and sister; Cancer in her father and maternal aunt; Diabetes in her maternal aunt, maternal grandmother, mother, and sister; Hypertension in her father, mother, and sister; Obesity in her daughter, father, mother, sister, and sister; Varicose Veins in her mother.  Laboratory Chemistry Profile   Renal Lab Results  Component Value Date   BUN 5 (L) 12/16/2021   CREATININE 0.62 12/16/2021   BCR 8 (L) 10/02/2021   GFRAA 100 07/24/2020   GFRNONAA >60 12/16/2021    Hepatic Lab  Results  Component Value Date   AST 20 10/02/2021   ALT 13 10/02/2021   ALBUMIN 3.5 (L) 10/02/2021   ALKPHOS 101 10/02/2021   LIPASE 25 08/31/2016    Electrolytes Lab Results  Component Value Date   NA 143 12/16/2021   K 3.2 (L) 12/16/2021   CL 107 12/16/2021   CALCIUM 9.6 12/16/2021   MG 1.9 03/03/2021    Bone No results found for: "VD25OH", "VD125OH2TOT", "UT6546TK3", "TW6568LE7", "25OHVITD1", "25OHVITD2", "25OHVITD3", "TESTOFREE", "TESTOSTERONE"  Inflammation (CRP: Acute Phase) (ESR: Chronic Phase) Lab Results  Component Value Date   LATICACIDVEN 1.5 03/03/2021         Note: Above Lab results reviewed.  Recent Imaging Review  MM 3D SCREEN BREAST BILATERAL CLINICAL DATA:  Screening.  EXAM: DIGITAL SCREENING BILATERAL MAMMOGRAM WITH TOMOSYNTHESIS AND CAD  TECHNIQUE: Bilateral screening digital craniocaudal and mediolateral oblique mammograms were obtained. Bilateral screening digital breast tomosynthesis was performed. The images were evaluated  with computer-aided detection.  COMPARISON:  Previous exam(s).  ACR Breast Density Category b: There are scattered areas of fibroglandular density.  FINDINGS: There are no findings suspicious for malignancy.  IMPRESSION: No mammographic evidence of malignancy. A result letter of this screening mammogram will be mailed directly to the patient.  RECOMMENDATION: Screening mammogram in one year. (Code:SM-B-01Y)  BI-RADS CATEGORY  1: Negative.  Electronically Signed   By: Kristopher Oppenheim M.D.   On: 01/05/2022 13:18 Note: Reviewed        Physical Exam  General appearance: Well nourished, well developed, and well hydrated. In no apparent acute distress Mental status: Alert, oriented x 3 (person, place, & time)       Respiratory: No evidence of acute respiratory distress Eyes: PERLA Vitals: BP 119/84   Pulse 79   Temp 98.3 F (36.8 C)   Resp 16   Ht _0  (1.702 m)   Wt 234 lb (106.1 kg)   SpO2 100%   BMI 36.65 kg/m  BMI: Estimated body mass index is 36.65 kg/m as calculated from the following:   Height as of this encounter: _1  (1.702 m).   Weight as of this encounter: 234 lb (106.1 kg). Ideal: Ideal body weight: 61.6 kg (135 lb 12.9 oz) Adjusted ideal body weight: 79.4 kg (175 lb 1.3 oz)    Thoracic Spine Area Exam  Skin & Axial Inspection: Well healed scar from previous spine surgery detected Alignment: Symmetrical Functional ROM: Pain restricted ROM Stability: No instability detected Muscle Tone/Strength: Functionally intact. No obvious neuro-muscular anomalies detected. Sensory (Neurological): Neurogenic pain pattern Muscle strength & Tone: No palpable anomalies Lumbar Spine Area Exam  Skin & Axial Inspection: Well healed scar from previous spine surgery detected spinal cord stimulator IPG  along the left flank Alignment: Symmetrical Functional ROM: Pain restricted ROM       Stability: No instability detected Muscle Tone/Strength: Functionally intact. No  obvious neuro-muscular anomalies detected. Sensory (Neurological): Dermatomal pain pattern   Gait & Posture Assessment  Ambulation: Limited Gait: Antalgic gait (limping) Posture: Difficulty standing up straight, due to pain  Lower Extremity Exam      Side: Right lower extremity   Side: Left lower extremity  Stability: No instability observed           Stability: No instability observed          Skin & Extremity Inspection: Skin color, temperature, and hair growth are WNL. No peripheral edema or cyanosis. No masses, redness, swelling, asymmetry, or associated skin lesions. No contractures.  Skin & Extremity Inspection: Skin color, temperature, and hair growth are WNL. No peripheral edema or cyanosis. No masses, redness, swelling, asymmetry, or associated skin lesions. No contractures.  Functional ROM: Unrestricted ROM                   Functional ROM: Unrestricted ROM                  Muscle Tone/Strength: Functionally intact. No obvious neuro-muscular anomalies detected.   Muscle Tone/Strength: Functionally intact. No obvious neuro-muscular anomalies detected.  Sensory (Neurological): Musculoskeletal pain pattern         Sensory (Neurological): Musculoskeletal pain pattern        DTR: Patellar: deferred today Achilles: deferred today Plantar: deferred today   DTR: Patellar: deferred today Achilles: deferred today Plantar: deferred today  Palpation: No palpable anomalies   Palpation: No palpable anomalies       Assessment   Diagnosis Status  1. Failed back surgical syndrome   2. Spinal cord stimulator status [employer], implanted 2017)   3. Chronic radicular lumbar pain   4. History of lumbar fusion (L5-S1 PSIF)   5. Pain management contract signed   6. Encounter for long-term opiate analgesic use   7. Chronic pain syndrome     Controlled Controlled Controlled     Plan of Care    Ms. Gulianna Evette Cowan has a current medication list which includes the  following long-term medication(s): fluticasone, furosemide, levocetirizine, rizatriptan, temazepam, and gabapentin.  Pharmacotherapy (Medications Ordered): Meds ordered this encounter  Medications   HYDROcodone-acetaminophen (NORCO) 7.5-325 MG tablet    Sig: Take 1 tablet by mouth at bedtime as needed for severe pain. Must last 30 days.    Dispense:  30 tablet    Refill:  0    Chronic Pain: STOP Act (Not applicable) Fill 1 day early if closed on refill date. Avoid benzodiazepines within 8 hours of opioids   HYDROcodone-acetaminophen (NORCO) 7.5-325 MG tablet    Sig: Take 1 tablet by mouth at bedtime as needed for severe pain. Must last 30 days.    Dispense:  30 tablet    Refill:  0    Chronic Pain: STOP Act (Not applicable) Fill 1 day early if closed on refill date. Avoid benzodiazepines within 8 hours of opioids   gabapentin (NEURONTIN) 300 MG capsule    Sig: Take 2 capsules (600 mg total) by mouth at bedtime.    Dispense:  60 capsule    Refill:  5     Follow-up plan:   Return in about 10 weeks (around 10/06/2022) for Medication Management, in person.    I discussed the assessment and treatment plan with the patient. The patient was provided an opportunity to ask questions and all were answered. The patient agreed with the plan and demonstrated an understanding of the instructions.  Patient advised to call back or seek an in-person evaluation if the symptoms or condition worsens.  Duration of encounter: 59mnutes.  Total time on encounter, as per AMA guidelines included both the face-to-face and non-face-to-face time personally spent by the physician and/or other qualified health care professional(s) on the day of the encounter (includes time in activities that require the physician or other qualified health care professional and does not include time in activities normally performed by clinical staff). Physician's time may include the following activities when performed: preparing to  see the patient (eg, review of tests, pre-charting review of records) obtaining and/or reviewing separately obtained history  performing a medically appropriate examination and/or evaluation counseling and educating the patient/family/caregiver ordering medications, tests, or procedures referring and communicating with other health care professionals (when not separately reported) documenting clinical information in the electronic or other health record independently interpreting results (not separately reported) and communicating results to the patient/ family/caregiver care coordination (not separately reported)  Note by: Gillis Santa, MD Date: 07/28/2022; Time: 3:09 PM

## 2022-07-28 NOTE — Patient Instructions (Signed)

## 2022-07-28 NOTE — Progress Notes (Signed)
Nursing Pain Medication Assessment:  Safety precautions to be maintained throughout the outpatient stay will include: orient to surroundings, keep bed in low position, maintain call bell within reach at all times, provide assistance with transfer out of bed and ambulation.  Medication Inspection Compliance: Pill count conducted under aseptic conditions, in front of the patient. Neither the pills nor the bottle was removed from the patient's sight at any time. Once count was completed pills were immediately returned to the patient in their original bottle.  Medication: Hydrocodone/APAP Pill/Patch Count:  10 of 30 pills remain Pill/Patch Appearance: Markings consistent with prescribed medication Bottle Appearance: Standard pharmacy container. Clearly labeled. Filled Date: 10 / 009 / 2023 Last Medication intake:  Today

## 2022-08-03 ENCOUNTER — Ambulatory Visit: Payer: Medicare HMO

## 2022-08-06 ENCOUNTER — Other Ambulatory Visit: Payer: Self-pay | Admitting: Internal Medicine

## 2022-08-06 DIAGNOSIS — R600 Localized edema: Secondary | ICD-10-CM

## 2022-08-06 NOTE — Telephone Encounter (Signed)
Requested medication (s) are due for refill today - yes  Requested medication (s) are on the active medication list -yes  Future visit scheduled -yes  Last refill: 04/08/22 #90  Notes to clinic: fails lab and visit protocol- sent for review of request  Requested Prescriptions  Pending Prescriptions Disp Refills   furosemide (LASIX) 20 MG tablet [Pharmacy Med Name: FUROSEMIDE '20MG'$  TABLETS] 90 tablet 0    Sig: TAKE 1 TABLET(20 MG) BY MOUTH DAILY     Cardiovascular:  Diuretics - Loop Failed - 08/06/2022 12:35 PM      Failed - K in normal range and within 180 days    Potassium  Date Value Ref Range Status  12/16/2021 3.2 (L) 3.5 - 5.1 mmol/L Final  10/31/2014 3.8 3.5 - 5.1 mmol/L Final         Failed - Ca in normal range and within 180 days    Calcium  Date Value Ref Range Status  12/16/2021 9.6 8.9 - 10.3 mg/dL Final   Calcium, Total  Date Value Ref Range Status  10/31/2014 9.1 8.5 - 10.1 mg/dL Final         Failed - Na in normal range and within 180 days    Sodium  Date Value Ref Range Status  12/16/2021 143 135 - 145 mmol/L Final  10/02/2021 145 (H) 134 - 144 mmol/L Final  10/31/2014 138 136 - 145 mmol/L Final         Failed - Cr in normal range and within 180 days    Creatinine  Date Value Ref Range Status  10/31/2014 0.69 0.60 - 1.30 mg/dL Final   Creatinine, Ser  Date Value Ref Range Status  12/16/2021 0.62 0.44 - 1.00 mg/dL Final         Failed - Cl in normal range and within 180 days    Chloride  Date Value Ref Range Status  12/16/2021 107 98 - 111 mmol/L Final  10/31/2014 104 98 - 107 mmol/L Final         Failed - Mg Level in normal range and within 180 days    Magnesium  Date Value Ref Range Status  03/03/2021 1.9 1.7 - 2.4 mg/dL Final    Comment:    Performed at [facility], [address] [place], [place] [address]         Failed - Valid encounter within last 6 months    Recent Outpatient Visits           9 months ago Diarrhea  of presumed infectious origin   [place] [facility] at [facility], Jesse Sans, MD   10 months ago Annual physical exam   [place] [facility] at [facility], Jesse Sans, MD   1 year ago Prediabetes   [place] [facility] at [facility], Jesse Sans, MD   2 years ago Prediabetes   [place] [facility] at [facility], Jesse Sans, MD   2 years ago Acute non-recurrent maxillary sinusitis   [place] [facility] at [facility], Jesse Sans, MD       Future Appointments             In 2 months Army Melia Jesse Sans, MD [facility] Primary Care and Sports Medicine at [facility], [place] BP  in normal range    BP Readings from Last 1 Encounters:  07/28/22 119/84            Requested Prescriptions  Pending Prescriptions Disp Refills   furosemide (LASIX) 20 MG tablet [Pharmacy Med Name: FUROSEMIDE '20MG'$  TABLETS] 90 tablet 0    Sig: TAKE 1 TABLET(20 MG) BY MOUTH DAILY     Cardiovascular:  Diuretics - Loop Failed - 08/06/2022 12:35 PM      Failed - K in normal range and within 180 days    Potassium  Date Value Ref Range Status  12/16/2021 3.2 (L) 3.5 - 5.1 mmol/L Final  10/31/2014 3.8 3.5 - 5.1 mmol/L Final         Failed - Ca in normal range and within 180 days    Calcium  Date Value Ref Range Status  12/16/2021 9.6 8.9 - 10.3 mg/dL Final   Calcium, Total  Date Value Ref Range Status  10/31/2014 9.1 8.5 - 10.1 mg/dL Final         Failed - Na in normal range and within 180 days    Sodium  Date Value Ref Range Status  12/16/2021 143 135 - 145 mmol/L Final  10/02/2021 145 (H) 134 - 144 mmol/L Final  10/31/2014 138 136 - 145 mmol/L Final         Failed - Cr in normal range and within 180 days    Creatinine  Date Value Ref Range Status  10/31/2014 0.69 0.60 - 1.30 mg/dL Final    Creatinine, Ser  Date Value Ref Range Status  12/16/2021 0.62 0.44 - 1.00 mg/dL Final         Failed - Cl in normal range and within 180 days    Chloride  Date Value Ref Range Status  12/16/2021 107 98 - 111 mmol/L Final  10/31/2014 104 98 - 107 mmol/L Final         Failed - Mg Level in normal range and within 180 days    Magnesium  Date Value Ref Range Status  03/03/2021 1.9 1.7 - 2.4 mg/dL Final    Comment:    Performed at [facility], [address] [place], [place] [address]         Failed - Valid encounter within last 6 months    Recent Outpatient Visits           9 months ago Diarrhea of presumed infectious origin   [place] [facility] at [facility], Jesse Sans, MD   10 months ago Annual physical exam   [place] [facility] at [facility], Jesse Sans, MD   1 year ago Prediabetes   [place] [facility] at [facility], Jesse Sans, MD   2 years ago Prediabetes   [place] [facility] at [facility], Jesse Sans, MD   2 years ago Acute non-recurrent maxillary sinusitis    Primary Care and Sports Medicine at [facility], Jesse Sans, MD       Future Appointments             In 2 months Glean Hess, MD [facility] Primary [facility] at [facility], [place] BP in normal range    BP Readings from Last 1 Encounters:  07/28/22 119/84

## 2022-08-07 ENCOUNTER — Ambulatory Visit: Payer: Medicare Other

## 2022-09-02 ENCOUNTER — Other Ambulatory Visit: Payer: Self-pay | Admitting: Internal Medicine

## 2022-09-02 DIAGNOSIS — R600 Localized edema: Secondary | ICD-10-CM

## 2022-09-02 NOTE — Telephone Encounter (Signed)
Requested medication (s) are due for refill today:   Yes  Requested medication (s) are on the active medication list:   Yes  Future visit scheduled:   Yes in 1 month   Last ordered: 08/06/2022 #30, 0 refills  Returned because labs due and invalid encounter within 6 months.   Has upcoming appt.   Provider to review for refills prior to appt.   Requested Prescriptions  Pending Prescriptions Disp Refills   furosemide (LASIX) 20 MG tablet [Pharmacy Med Name: FUROSEMIDE '20MG'$  TABLETS] 60 tablet     Sig: TAKE 1 TABLET(20 MG) BY MOUTH DAILY     Cardiovascular:  Diuretics - Loop Failed - 09/02/2022  9:19 AM      Failed - K in normal range and within 180 days    Potassium  Date Value Ref Range Status  12/16/2021 3.2 (L) 3.5 - 5.1 mmol/L Final  10/31/2014 3.8 3.5 - 5.1 mmol/L Final         Failed - Ca in normal range and within 180 days    Calcium  Date Value Ref Range Status  12/16/2021 9.6 8.9 - 10.3 mg/dL Final   Calcium, Total  Date Value Ref Range Status  10/31/2014 9.1 8.5 - 10.1 mg/dL Final         Failed - Na in normal range and within 180 days    Sodium  Date Value Ref Range Status  12/16/2021 143 135 - 145 mmol/L Final  10/02/2021 145 (H) 134 - 144 mmol/L Final  10/31/2014 138 136 - 145 mmol/L Final         Failed - Cr in normal range and within 180 days    Creatinine  Date Value Ref Range Status  10/31/2014 0.69 0.60 - 1.30 mg/dL Final   Creatinine, Ser  Date Value Ref Range Status  12/16/2021 0.62 0.44 - 1.00 mg/dL Final         Failed - Cl in normal range and within 180 days    Chloride  Date Value Ref Range Status  12/16/2021 107 98 - 111 mmol/L Final  10/31/2014 104 98 - 107 mmol/L Final         Failed - Mg Level in normal range and within 180 days    Magnesium  Date Value Ref Range Status  03/03/2021 1.9 1.7 - 2.4 mg/dL Final    Comment:    Performed at [facility], [address] [place], [place] [address]         Failed - Valid  encounter within last 6 months    Recent Outpatient Visits           10 months ago Diarrhea of presumed infectious origin   [place] [facility] at [facility], Jesse Sans, MD   11 months ago Annual physical exam   [place] [facility] at [facility], Jesse Sans, MD   1 year ago Prediabetes   [place] [facility] at [facility], Jesse Sans, MD   2 years ago Prediabetes   [place] [facility] at [facility], Jesse Sans, MD   2 years ago Acute non-recurrent maxillary sinusitis   [place] [facility] at [facility], Jesse Sans, MD       Future Appointments             In 1 month Army Melia Jesse Sans, MD [facility] at [facility], [place] BP in normal range    BP Readings from Last 1 Encounters:  07/28/22 119/84

## 2022-09-06 ENCOUNTER — Other Ambulatory Visit: Payer: Self-pay | Admitting: Internal Medicine

## 2022-09-06 DIAGNOSIS — E538 Deficiency of other specified B group vitamins: Secondary | ICD-10-CM

## 2022-09-10 ENCOUNTER — Encounter: Payer: Self-pay | Admitting: Internal Medicine

## 2022-09-22 ENCOUNTER — Other Ambulatory Visit: Payer: Self-pay | Admitting: Internal Medicine

## 2022-09-22 DIAGNOSIS — F5101 Primary insomnia: Secondary | ICD-10-CM

## 2022-09-22 NOTE — Telephone Encounter (Signed)
Requested medication (s) are due for refill today: yes   Requested medication (s) are on the active medication list: yes   Last refill:  05/08/22 #30 with 3 refills   Future visit scheduled: yes   Notes to clinic:  Please review for refill. Refill not delegated per protocol    Requested Prescriptions  Pending Prescriptions Disp Refills   temazepam (RESTORIL) 15 MG capsule [Pharmacy Med Name: TEMAZEPAM '15MG'$  CAPSULES] 30 capsule     Sig: TAKE 1 CAPSULE(15 MG) BY MOUTH AT BEDTIME AS NEEDED FOR SLEEP     Not Delegated - Psychiatry: Anxiolytics/Hypnotics 2 Failed - 09/22/2022 11:49 AM      Failed - This refill cannot be delegated      Failed - Urine Drug Screen completed in last 360 days      Failed - Valid encounter within last 6 months    Recent Outpatient Visits           11 months ago Diarrhea of presumed infectious origin   [place] at [facility], Jesse Sans, MD   11 months ago Annual physical exam   [place] [facility] at [facility], Jesse Sans, MD   1 year ago Prediabetes   [place] [facility] at [facility], Jesse Sans, MD   2 years ago Prediabetes   [place] [facility] at [facility], Jesse Sans, MD   2 years ago Acute non-recurrent maxillary sinusitis   [place] [facility] at [facility], Jesse Sans, MD       Future Appointments             In 1 week Army Melia Jesse Sans, MD [facility] Primary Care and Sports Medicine at [facility], [place] - Patient is not pregnant

## 2022-09-22 NOTE — Telephone Encounter (Signed)
Please review. Last office visit 10/24/2021.  KP

## 2022-09-24 ENCOUNTER — Encounter: Payer: BC Managed Care – PPO | Admitting: Student in an Organized Health Care Education/Training Program

## 2022-09-25 ENCOUNTER — Encounter: Payer: Self-pay | Admitting: Internal Medicine

## 2022-09-25 ENCOUNTER — Ambulatory Visit (INDEPENDENT_AMBULATORY_CARE_PROVIDER_SITE_OTHER): Payer: BC Managed Care – PPO | Admitting: Internal Medicine

## 2022-09-25 VITALS — BP 124/78 | HR 100 | Temp 100.4°F | Ht 67.0 in | Wt 215.0 lb

## 2022-09-25 DIAGNOSIS — R6889 Other general symptoms and signs: Secondary | ICD-10-CM | POA: Diagnosis not present

## 2022-09-25 DIAGNOSIS — J101 Influenza due to other identified influenza virus with other respiratory manifestations: Secondary | ICD-10-CM | POA: Diagnosis not present

## 2022-09-25 LAB — POCT INFLUENZA A/B
Influenza A, POC: POSITIVE — AB
Influenza B, POC: NEGATIVE

## 2022-09-25 LAB — POC COVID19 BINAXNOW: SARS Coronavirus 2 Ag: NEGATIVE

## 2022-09-25 MED ORDER — PROMETHAZINE-DM 6.25-15 MG/5ML PO SYRP: 5.0000 mL | ORAL_SOLUTION | Freq: Four times a day (QID) | ORAL | 0 refills | Status: AC | PRN

## 2022-09-25 NOTE — Patient Instructions (Signed)
Tylenol 650 mg - 1000 mg every 8 hours Mucinex-D  for sinus congestion

## 2022-09-25 NOTE — Progress Notes (Signed)
Date:  09/25/2022   Name:  Kimberly Cowan   DOB:  Jun 24, 1966   MRN:  [id number]   Chief Complaint: Flu-like Symptoms (Headache, Cough, body aches, low grade fever. )  Influenza This is a new problem. Episode onset: three days ago. The problem has been unchanged. Associated symptoms include chills, congestion, coughing, fatigue, a fever and headaches. Pertinent negatives include no abdominal pain, chest pain, nausea, sore throat or vomiting.    Lab Results  Component Value Date   NA 143 12/16/2021   K 3.2 (L) 12/16/2021   CO2 28 12/16/2021   GLUCOSE 90 12/16/2021   BUN 5 (L) 12/16/2021   CREATININE 0.62 12/16/2021   CALCIUM 9.6 12/16/2021   EGFR 102 10/02/2021   GFRNONAA >60 12/16/2021   Lab Results  Component Value Date   CHOL 122 10/02/2021   HDL 66 10/02/2021   LDLCALC 43 10/02/2021   TRIG 57 10/02/2021   CHOLHDL 1.8 10/02/2021   Lab Results  Component Value Date   TSH 0.786 10/02/2021   Lab Results  Component Value Date   HGBA1C 5.5 10/02/2021   Lab Results  Component Value Date   WBC 3.9 (L) 12/16/2021   HGB 13.2 12/16/2021   HCT 41.6 12/16/2021   MCV 92.2 12/16/2021   PLT 197 12/16/2021   Lab Results  Component Value Date   ALT 13 10/02/2021   AST 20 10/02/2021   ALKPHOS 101 10/02/2021   BILITOT 0.5 10/02/2021   No results found for: "25OHVITD2", "25OHVITD3", "VD25OH"   Review of Systems  Constitutional:  Positive for chills, fatigue and fever.  HENT:  Positive for congestion and sinus pressure. Negative for ear pain, sore throat and trouble swallowing.   Respiratory:  Positive for cough.   Cardiovascular:  Negative for chest pain.  Gastrointestinal:  Negative for abdominal distention, abdominal pain, nausea and vomiting.  Neurological:  Positive for headaches. Negative for dizziness and light-headedness.    Patient Active Problem List   Diagnosis Date Noted   Pain management contract signed 05/07/2022   Spinal cord stimulator  status ([place], implanted 2017) 04/28/2022   History of lumbar fusion (L5-S1 PSIF) 04/28/2022   Encounter for long-term opiate analgesic use 04/28/2022   Chronic pain syndrome 04/28/2022   Degenerative spondylolisthesis 12/19/2021   Chronic GERD 03/03/2021   Status post hysterectomy 11/05/2020   Bariatric surgery status 01/01/2020   Primary insomnia 12/28/2019   Failed back surgical syndrome 11/27/2019   Mild peripheral edema 11/15/2017   Perforated ear drum, left 06/25/2017   History of pulmonary embolism 06/10/2017   Environmental and seasonal allergies 04/07/2017   Tinea versicolor 02/03/2017   Vasomotor symptoms due to menopause 11/11/2016   Glaucoma 09/28/2016   B12 nutritional deficiency 08/05/2016   OSA on CPAP 03/04/2016   Chronic depression 12/09/2015   Anxiety 12/09/2015   Chronic radicular lumbar pain 08/14/2015   Status post gastric bypass for obesity 07/04/2015   Prediabetes 07/01/2015   Spinal stenosis of lumbar region 12/10/2014   Headache, migraine 04/04/2012    Allergies  Allergen Reactions   Morphine And Related Hives, Shortness Of Breath, Nausea And Vomiting and Other (See Comments)    "can't breathe"   Penicillins Anaphylaxis, Hives, Shortness Of Breath and Nausea And Vomiting    "can't breathe", Has patient had a PCN reaction causing immediate rash, facial/tongue/throat swelling, SOB or lightheadedness with hypotension: Yes Has patient had a PCN reaction causing severe rash involving mucus membranes or skin necrosis: No Has patient  had a PCN reaction that required hospitalization No Has patient had a PCN reaction occurring within the last 10 years: No If all of the above answers are "NO", then may proceed with Cephalosporin use.    Latex Hives   Baclofen Nausea Only    Jittery, anxious    Past Surgical History:  Procedure Laterality Date   ABDOMINAL HYSTERECTOMY  10/2006   BACK SURGERY  2014   lumbar fusion   BACK SURGERY  2009    discectomy   BARIATRIC SURGERY  07/2020   Revision   [place]   CHOLECYSTECTOMY  01/2021   COLONOSCOPY WITH PROPOFOL N/A 04/12/2018   Procedure: COLONOSCOPY WITH PROPOFOL;  Surgeon: Lin Landsman, MD;  Location: [place];  Service: Gastroenterology;  Laterality: N/A;   GASTRIC BYPASS  2005   KNEE ARTHROSCOPY WITH LATERAL MENISECTOMY Left 05/27/2017   Procedure: KNEE ARTHROSCOPY WITH LATERAL MENISECTOMY;  Surgeon: Hessie Knows, MD;  Location: [facility];  Service: Orthopedics;  Laterality: Left;   KNEE ARTHROSCOPY WITH LATERAL RELEASE Left 05/27/2017   Procedure: KNEE ARTHROSCOPY WITH LATERAL RELEASE;  Surgeon: Hessie Knows, MD;  Location: [facility];  Service: Orthopedics;  Laterality: Left;   LAPAROSCOPIC SALPINGO OOPHERECTOMY Bilateral 10/05/2016   Procedure: LAPAROSCOPIC SALPINGO OOPHORECTOMY;  Surgeon: Brayton Mars, MD;  Location: [facility];  Service: Gynecology;  Laterality: Bilateral;   REDUCTION MAMMAPLASTY Bilateral    2001   SPINAL CORD STIMULATOR INSERTION N/A 03/20/2016   Procedure: LUMBAR SPINAL CORD STIMULATOR INSERTION;  Surgeon: Eustace Moore, MD;  Location: [facility];  Service: Neurosurgery;  Laterality: N/A;   SPINE SURGERY  2009 2014 2017   TONSILLECTOMY      Social History   Tobacco Use   Smoking status: Never   Smokeless tobacco: Never   Tobacco comments:    smoking cessation materials not required  Vaping Use   Vaping Use: Never used  Substance Use Topics   Alcohol use: No   Drug use: No     Medication list has been reviewed and updated.  Current Meds  Medication Sig   cyanocobalamin (VITAMIN B12) 1000 MCG/ML injection ADMINISTER 1 ML(1000 MCG) IN THE MUSCLE EVERY 30 DAYS   cyclobenzaprine (FLEXERIL) 10 MG tablet Take 10 mg by mouth daily as needed for muscle spasms.   diphenoxylate-atropine (LOMOTIL) 2.5-0.025 MG tablet Take 2 tablets by mouth 4 (four) times daily.   fluticasone (FLONASE) 50 MCG/ACT nasal spray  Place 1 spray into both nostrils daily as needed for allergies.   furosemide (LASIX) 20 MG tablet TAKE 1 TABLET(20 MG) BY MOUTH DAILY   gabapentin (NEURONTIN) 300 MG capsule Take 2 capsules (600 mg total) by mouth at bedtime.   HYDROcodone-acetaminophen (NORCO) 7.5-325 MG tablet Take 1 tablet by mouth at bedtime as needed for severe pain. Must last 30 days.   latanoprost (XALATAN) 0.005 % ophthalmic solution Place 1 drop into both eyes at bedtime.    levocetirizine (XYZAL) 5 MG tablet Take 1 tablet (5 mg total) by mouth every evening.   Multiple Vitamins-Minerals (MULTIVITAMIN GUMMIES ADULTS PO) Take 1 tablet by mouth daily.   potassium chloride (MICRO-K) 10 MEQ CR capsule Take 10 mEq by mouth daily.   promethazine-dextromethorphan (PROMETHAZINE-DM) 6.25-15 MG/5ML syrup Take 5 mLs by mouth 4 (four) times daily as needed for up to 9 days for cough.   rizatriptan (MAXALT-MLT) 10 MG disintegrating tablet DISSOLVE 1 TABLET BY MOUTH AS NEEDED FOR MIGRAINE- MAY REPEAT IN 2 HOURS AS NEEDED  scopolamine (TRANSDERM-SCOP) 1 MG/3DAYS Place 1 patch onto the skin every 3 (three) days. As needed for nausea   temazepam (RESTORIL) 15 MG capsule TAKE 1 CAPSULE(15 MG) BY MOUTH AT BEDTIME AS NEEDED FOR SLEEP   [DISCONTINUED] cetirizine (ZYRTEC) 10 MG tablet Take 10 mg by mouth at bedtime.   [DISCONTINUED] ketoconazole (NIZORAL) 2 % shampoo APPLY TOPICALLY DAILY AS NEEDED FOR SKIN FUNGUS ON NECK/BACK   Current Facility-Administered Medications for the 09/25/22 encounter (Office Visit) with Glean Hess, MD  Medication   cyanocobalamin ((VITAMIN B-12)) injection 1,000 mcg       10/24/2021    1:35 PM 10/02/2021    8:02 AM 11/25/2020    9:14 AM 07/24/2020    2:58 PM  GAD 7 : Generalized Anxiety Score  Nervous, Anxious, on Edge 1 1 0 0  Control/stop worrying 0 1 0 0  Worry too much - different things 1 1 0 0  Trouble relaxing 3 1 0 0  Restless 0 1 0 0  Easily annoyed or irritable 1 1 0 0  Afraid - awful  might happen 1 1 0 0  Total GAD 7 Score 7 7 0 0  Anxiety Difficulty  Not difficult at all  Not difficult at all       10/24/2021    1:34 PM 10/02/2021    8:02 AM 07/30/2021    3:38 PM  Depression screen PHQ 2/9  Decreased Interest 0 0 0  Down, Depressed, Hopeless 0 0 0  PHQ - 2 Score 0 0 0  Altered sleeping _0 Tired, decreased energy 0 1 3  Change in appetite 0 0 0  Feeling bad or failure about yourself  0 0 0  Trouble concentrating 0 0 0  Moving slowly or fidgety/restless 0 0 0  Suicidal thoughts 0 0 0  PHQ-9 Score _1 Difficult doing work/chores Not difficult at all Not difficult at all Not difficult at all    BP Readings from Last 3 Encounters:  09/25/22 124/78  07/28/22 119/84  06/03/22 (!) 143/85    Physical Exam Vitals and nursing note reviewed.  Constitutional:      General: She is not in acute distress.    Appearance: She is well-developed. She is ill-appearing.  HENT:     Head: Normocephalic and atraumatic.     Right Ear: Tympanic membrane is retracted.     Left Ear: Tympanic membrane is retracted.     Nose:     Right Sinus: Maxillary sinus tenderness present. No frontal sinus tenderness.     Left Sinus: Maxillary sinus tenderness present. No frontal sinus tenderness.  Cardiovascular:     Rate and Rhythm: Normal rate and regular rhythm.     Pulses: Normal pulses.  Pulmonary:     Effort: Pulmonary effort is normal. No respiratory distress.     Breath sounds: No wheezing or rhonchi.  Musculoskeletal:     Cervical back: Normal range of motion.  Lymphadenopathy:     Cervical: No cervical adenopathy.  Skin:    General: Skin is warm and dry.     Findings: No rash.  Neurological:     Mental Status: She is alert and oriented to person, place, and time.  Psychiatric:        Mood and Affect: Mood normal.        Behavior: Behavior normal.     Wt Readings from Last 3 Encounters:  09/25/22 215 lb (97.5 kg)  07/28/22 234  lb (106.1 kg)  06/03/22 236 lb  (107 kg)    BP 124/78   Pulse 100   Temp (!) 100.4 F (38 C) (Oral)   Ht _0  (1.702 m)   Wt 215 lb (97.5 kg)   SpO2 98%   BMI 33.67 kg/m   Assessment and Plan: Problem List Items Addressed This Visit   None Visit Diagnoses     Influenza A    -  Primary   Not a candidate for Tamiflu due to onset > 48 hrs ago Rec Tylenol every 6-8 hours push fluids Mucinex-D for congestion; continue Flonase   Flu-like symptoms       Relevant Medications   promethazine-dextromethorphan (PROMETHAZINE-DM) 6.25-15 MG/5ML syrup   Other Relevant Orders   POCT Influenza A/B (Completed)   POC COVID-19 BinaxNow (Completed)        Partially dictated using Editor, commissioning. Any errors are unintentional.  Halina Maidens, MD [facility]  09/25/2022

## 2022-09-29 DIAGNOSIS — H40153 Residual stage of open-angle glaucoma, bilateral: Secondary | ICD-10-CM | POA: Diagnosis not present

## 2022-10-01 ENCOUNTER — Encounter: Payer: Self-pay | Admitting: Student in an Organized Health Care Education/Training Program

## 2022-10-01 ENCOUNTER — Ambulatory Visit
Payer: BC Managed Care – PPO | Attending: Student in an Organized Health Care Education/Training Program | Admitting: Student in an Organized Health Care Education/Training Program

## 2022-10-01 VITALS — BP 126/79 | HR 78 | Temp 97.4°F | Ht 67.0 in | Wt 215.0 lb

## 2022-10-01 DIAGNOSIS — Z9689 Presence of other specified functional implants: Secondary | ICD-10-CM | POA: Diagnosis not present

## 2022-10-01 DIAGNOSIS — G894 Chronic pain syndrome: Secondary | ICD-10-CM | POA: Diagnosis not present

## 2022-10-01 DIAGNOSIS — Z0289 Encounter for other administrative examinations: Secondary | ICD-10-CM

## 2022-10-01 DIAGNOSIS — Z981 Arthrodesis status: Secondary | ICD-10-CM

## 2022-10-01 DIAGNOSIS — M961 Postlaminectomy syndrome, not elsewhere classified: Secondary | ICD-10-CM

## 2022-10-01 DIAGNOSIS — M5416 Radiculopathy, lumbar region: Secondary | ICD-10-CM

## 2022-10-01 DIAGNOSIS — G8929 Other chronic pain: Secondary | ICD-10-CM | POA: Diagnosis not present

## 2022-10-01 DIAGNOSIS — Z79891 Long term (current) use of opiate analgesic: Secondary | ICD-10-CM

## 2022-10-01 MED ORDER — HYDROCODONE-ACETAMINOPHEN 7.5-325 MG PO TABS: 1.0000 | ORAL_TABLET | Freq: Every evening | ORAL | 0 refills | Status: AC | PRN

## 2022-10-01 MED ORDER — GABAPENTIN 300 MG PO CAPS: 600.0000 mg | ORAL_CAPSULE | Freq: Every day | ORAL | 5 refills | Status: DC

## 2022-10-01 MED ORDER — HYDROCODONE-ACETAMINOPHEN 7.5-325 MG PO TABS: 1.0000 | ORAL_TABLET | Freq: Every evening | ORAL | 0 refills | Status: DC | PRN

## 2022-10-01 NOTE — Progress Notes (Signed)
Nursing Pain Medication Assessment:  Safety precautions to be maintained throughout the outpatient stay will include: orient to surroundings, keep bed in low position, maintain call bell within reach at all times, provide assistance with transfer out of bed and ambulation.  Medication Inspection Compliance: Pill count conducted under aseptic conditions, in front of the patient. Neither the pills nor the bottle was removed from the patient's sight at any time. Once count was completed pills were immediately returned to the patient in their original bottle.  Medication: Hydrocodone/APAP Pill/Patch Count:  11 of 30 pills remain Pill/Patch Appearance: Markings consistent with prescribed medication Bottle Appearance: Standard pharmacy container. Clearly labeled. Filled Date: 72 / 11 / 2023 Last Medication intake:  YesterdaySafety precautions to be maintained throughout the outpatient stay will include: orient to surroundings, keep bed in low position, maintain call bell within reach at all times, provide assistance with transfer out of bed and ambulation.

## 2022-10-01 NOTE — Progress Notes (Signed)
PROVIDER NOTE: Information contained herein reflects review and annotations entered in association with encounter. Interpretation of such information and data should be left to medically-trained personnel. Information provided to patient can be located elsewhere in the medical record under "Patient Instructions". Document created using STT-dictation technology, any transcriptional errors that may result from process are unintentional.    Patient: Kimberly Cowan  Service Category: E/M  Provider: Gillis Santa, MD  DOB: January 12, 1966  DOS: 10/01/2022  Referring Provider: Glean Hess, MD  MRN: [id number]  Specialty: Interventional Pain Management  PCP: Glean Hess, MD  Type: Established Patient  Setting: Ambulatory outpatient    Location: Office  Delivery: Face-to-face     HPI  [name], a 57 y.o. 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 [name] primary complain today is Back Pain (both) Last encounter: My last encounter with her was on 07/28/22 Pertinent problems: Kimberly Cowan has Failed back surgical syndrome; Spinal stenosis of lumbar region; Chronic depression; Spinal cord stimulator status [employer], implanted 2017); History of lumbar fusion (L5-S1 PSIF); Encounter for long-term opiate analgesic use; Chronic pain syndrome; and Pain management contract signed on their pertinent problem list. Pain Assessment: Severity of Chronic pain is reported as a 7 /10. Location: Back Left, Right/pain radiaities down both leg to her foot. Onset: More than a month ago. Quality: Aching, Burning, Constant, Throbbing. Timing: Constant. Modifying factor(s): Meds and spine cord stimulator. Vitals:  height is 5' 7" (1.702 m) and weight is 215 lb (97.5 kg). Her temperature is 97.4 F (36.3 C) (abnormal). Her blood pressure is 126/79 and her pulse is 78. Her oxygen saturation is 98%.   Reason for encounter:  medication management.   No change in medical history since last visit.  Patient's pain is at baseline.  Patient continues multimodal pain regimen as prescribed.  States that it provides pain relief and improvement in functional status.  Continues to utilize a spinal cord stimulator.   05/07/22: Second patient visit today.  She has completed her urine toxicology screen which is appropriate.  I will have her sign pain contract and take her on for chronic pain management.  I will send in a prescription for hydrocodone 7.5 mg nightly.  I will see her back in 4 weeks for medication management.   Please see initial clinic visit HPI below Kimberly Cowan is a pleasant 57 year old female with a history of L4-S1 spinal fusion with [employer] spinal cord stimulator in place who is hoping to establish with pain management as her previous pain provider left [place] [facility] and is no longer excepting new patients.  She states that she has done physical therapy in the past tried various medications as well as injections with limited response.  She had her spinal cord stimulator implanted in 2017 with [employer] which was beneficial for her and allowed her to wean her hydrocodone to just once at night.  She states that she is able to manage with 7.5 mg nightly.  She is hoping to have this continued.  She also has restless leg syndrome and obesity.  She also has obstructive sleep apnea.  Given that she is on low-dose and has been compliant with previous pain clinic protocol, I will have her obtain baseline urine toxicology screen which is customary for new patients and I will see her back in approximately 7 to 10 days to sign pain contract and take her on for medication   Pharmacotherapy Assessment  Analgesic:  Hydrocodone 7.5 mg nightly as needed  Monitoring: [place] PMP: PDMP reviewed during this encounter.       Pharmacotherapy: No side-effects or adverse reactions reported. Compliance: No problems  identified. Effectiveness: Clinically acceptable.  Kimberly Fischer, RN  10/01/2022 12:59 PM  Sign when Signing Visit Nursing Pain Medication Assessment:  Safety precautions to be maintained throughout the outpatient stay will include: orient to surroundings, keep bed in low position, maintain call bell within reach at all times, provide assistance with transfer out of bed and ambulation.  Medication Inspection Compliance: Pill count conducted under aseptic conditions, in front of the patient. Neither the pills nor the bottle was removed from the patient's sight at any time. Once count was completed pills were immediately returned to the patient in their original bottle.  Medication: Hydrocodone/APAP Pill/Patch Count:  11 of 30 pills remain Pill/Patch Appearance: Markings consistent with prescribed medication Bottle Appearance: Standard pharmacy container. Clearly labeled. Filled Date: 31 / 11 / 2023 Last Medication intake:  YesterdaySafety precautions to be maintained throughout the outpatient stay will include: orient to surroundings, keep bed in low position, maintain call bell within reach at all times, provide assistance with transfer out of bed and ambulation.     No results found for: "CBDTHCR" No results found for: "D8THCCBX" No results found for: "D9THCCBX"  UDS:  Summary  Date Value Ref Range Status  04/28/2022 Note  Final    Comment:    ==================================================================== Compliance Drug Analysis, Ur ==================================================================== Test                             Result       Flag       Units  Drug Present and Declared for Prescription Verification   Oxazepam                       641          EXPECTED   ng/mg creat   Temazepam                      >2532        EXPECTED   ng/mg creat    Oxazepam and temazepam are expected metabolites of diazepam.    Oxazepam is also an expected metabolite of other  benzodiazepine    drugs, including chlordiazepoxide, prazepam, clorazepate, halazepam,    and temazepam.  Oxazepam and temazepam are available as scheduled    prescription medications.    Gabapentin                     PRESENT      EXPECTED   Cyclobenzaprine                PRESENT      EXPECTED   Desmethylcyclobenzaprine       PRESENT      EXPECTED    Desmethylcyclobenzaprine is an expected metabolite of    cyclobenzaprine.    Acetaminophen                  PRESENT      EXPECTED  Drug Absent but Declared for Prescription Verification   Hydrocodone                    Not Detected UNEXPECTED ng/mg creat   Methocarbamol  Not Detected UNEXPECTED ==================================================================== Test                      Result    Flag   Units      Ref Range   Creatinine              79               mg/dL      >=20 ==================================================================== Declared Medications:  The flagging and interpretation on this report are based on the  following declared medications.  Unexpected results may arise from  inaccuracies in the declared medications.   **Note: The testing scope of this panel includes these medications:   Cyclobenzaprine (Flexeril)  Gabapentin (Neurontin)  Hydrocodone (Norco)  Methocarbamol (Robaxin)  Temazepam (Restoril)   **Note: The testing scope of this panel does not include small to  moderate amounts of these reported medications:   Acetaminophen (Norco)   **Note: The testing scope of this panel does not include the  following reported medications:   Fluticasone (Flonase)  Furosemide (Lasix)  Ketoconazole (Nizoral)  Latanoprost (Xalatan)  Levocetirizine (Xyzal)  Multivitamin  Pantoprazole (Protonix)  Rizatriptan (Maxalt)  Scopolamine  Vitamin B12 ==================================================================== For clinical consultation, please call [phone]. ====================================================================       ROS  Constitutional: Denies any fever or chills Gastrointestinal: No reported hemesis, hematochezia, vomiting, or acute GI distress Musculoskeletal:  + LBP Neurological: No reported episodes of acute onset apraxia, aphasia, dysarthria, agnosia, amnesia, paralysis, loss of coordination, or loss of consciousness  Medication Review  HYDROcodone-acetaminophen, Multiple Vitamins-Minerals, cyanocobalamin, cyclobenzaprine, diphenoxylate-atropine, fluticasone, furosemide, gabapentin, latanoprost, levocetirizine, potassium chloride, promethazine-dextromethorphan, rizatriptan, scopolamine, and temazepam  History Review  Allergy: Kimberly Cowan is allergic to morphine and related, penicillins, latex, and baclofen. Drug: Kimberly Cowan  reports no history of drug use. Alcohol:  reports no history of alcohol use. Tobacco:  reports that she has never smoked. She has never used smokeless tobacco. Social: Kimberly Cowan  reports that she has never smoked. She has never used smokeless tobacco. She reports that she does not drink alcohol and does not use drugs. Medical:  has a past medical history of Allergy (1999), Anemia, Anxiety, Arm pain, left (05/17/2018), Arthritis, Bilateral lumbar radiculopathy, Cellulitis of abdominal wall (03/03/2021), Chronic back pain, Chronic GERD (03/03/2021), Clinical depression (04/04/2012), Depression, DVT (deep venous thrombosis) ([place]), Encounter for screening colonoscopy, Essential (primary) hypertension (06/04/2020), Glaucoma, Headache(784.0), Major depressive disorder with single episode, in full remission ([place]) (08/15/2018), Malfunction of jejunostomy tube ([place]) (02/13/2021), Ovarian cyst, Pneumonia, Pulmonary embolism ([place]) (05/2017), S/P laparoscopic cholecystectomy (02/07/2021), and Sleep apnea. Surgical: Kimberly Cowan  has a past surgical history that includes Tonsillectomy;  Gastric bypass (2005); Reduction mammaplasty (Bilateral); Spinal cord stimulator insertion (N/A, 03/20/2016); Abdominal hysterectomy (10/2006); Laparoscopic salpingo oophorectomy (Bilateral, 10/05/2016); Back surgery (2014); Back surgery (2009); Colonoscopy with propofol (N/A, 04/12/2018); Cesarean section (1989 1995); Spine surgery (2009 2014 2017); Knee arthroscopy with lateral menisectomy (Left, 05/27/2017); Knee arthroscopy with lateral release (Left, 05/27/2017); Bariatric Surgery (07/2020); and Cholecystectomy (01/2021). Family: family history includes Arthritis in her father, mother, sister, and sister; Cancer in her father and maternal aunt; Diabetes in her maternal aunt, maternal grandmother, mother, and sister; Hypertension in her father, mother, and sister; Obesity in her daughter, father, mother, sister, and sister; Varicose Veins in her mother.  Laboratory Chemistry Profile   Renal Lab Results  Component Value Date   BUN 5 (L) 12/16/2021   CREATININE 0.62 12/16/2021   BCR 8 (L)  10/02/2021   GFRAA 100 07/24/2020   GFRNONAA >60 12/16/2021    Hepatic Lab Results  Component Value Date   AST 20 10/02/2021   ALT 13 10/02/2021   ALBUMIN 3.5 (L) 10/02/2021   ALKPHOS 101 10/02/2021   LIPASE 25 08/31/2016    Electrolytes Lab Results  Component Value Date   NA 143 12/16/2021   K 3.2 (L) 12/16/2021   CL 107 12/16/2021   CALCIUM 9.6 12/16/2021   MG 1.9 03/03/2021    Bone No results found for: "VD25OH", "VD125OH2TOT", "AV4098JX9", "JY7829FA2", "25OHVITD1", "25OHVITD2", "25OHVITD3", "TESTOFREE", "TESTOSTERONE"  Inflammation (CRP: Acute Phase) (ESR: Chronic Phase) Lab Results  Component Value Date   LATICACIDVEN 1.5 03/03/2021         Note: Above Lab results reviewed.  Recent Imaging Review  MM 3D SCREEN BREAST BILATERAL CLINICAL DATA:  Screening.  EXAM: DIGITAL SCREENING BILATERAL MAMMOGRAM WITH TOMOSYNTHESIS AND CAD  TECHNIQUE: Bilateral screening digital craniocaudal  and mediolateral oblique mammograms were obtained. Bilateral screening digital breast tomosynthesis was performed. The images were evaluated with computer-aided detection.  COMPARISON:  Previous exam(s).  ACR Breast Density Category b: There are scattered areas of fibroglandular density.  FINDINGS: There are no findings suspicious for malignancy.  IMPRESSION: No mammographic evidence of malignancy. A result letter of this screening mammogram will be mailed directly to the patient.  RECOMMENDATION: Screening mammogram in one year. (Code:SM-B-01Y)  BI-RADS CATEGORY  1: Negative.  Electronically Signed   By: Kristopher Oppenheim M.D.   On: 01/05/2022 13:18 Note: Reviewed        Physical Exam  General appearance: Well nourished, well developed, and well hydrated. In no apparent acute distress Mental status: Alert, oriented x 3 (person, place, & time)       Respiratory: No evidence of acute respiratory distress Eyes: PERLA Vitals: BP 126/79   Pulse 78   Temp (!) 97.4 F (36.3 C)   Ht 5' 7" (1.702 m)   Wt 215 lb (97.5 kg)   SpO2 98%   BMI 33.67 kg/m  BMI: Estimated body mass index is 33.67 kg/m as calculated from the following:   Height as of this encounter: 5' 7" (1.702 m).   Weight as of this encounter: 215 lb (97.5 kg). Ideal: Ideal body weight: 61.6 kg (135 lb 12.9 oz) Adjusted ideal body weight: 76 kg (167 lb 7.7 oz)    Thoracic Spine Area Exam  Skin & Axial Inspection: Well healed scar from previous spine surgery detected Alignment: Symmetrical Functional ROM: Pain restricted ROM Stability: No instability detected Muscle Tone/Strength: Functionally intact. No obvious neuro-muscular anomalies detected. Sensory (Neurological): Neurogenic pain pattern Muscle strength & Tone: No palpable anomalies Lumbar Spine Area Exam  Skin & Axial Inspection: Well healed scar from previous spine surgery detected spinal cord stimulator IPG  along the left flank Alignment:  Symmetrical Functional ROM: Pain restricted ROM       Stability: No instability detected Muscle Tone/Strength: Functionally intact. No obvious neuro-muscular anomalies detected. Sensory (Neurological): Dermatomal pain pattern   Gait & Posture Assessment  Ambulation: Limited Gait: Antalgic gait (limping) Posture: Difficulty standing up straight, due to pain  Lower Extremity Exam      Side: Right lower extremity   Side: Left lower extremity  Stability: No instability observed           Stability: No instability observed          Skin & Extremity Inspection: Skin color, temperature, and hair growth are WNL. No peripheral edema or cyanosis.  No masses, redness, swelling, asymmetry, or associated skin lesions. No contractures.   Skin & Extremity Inspection: Skin color, temperature, and hair growth are WNL. No peripheral edema or cyanosis. No masses, redness, swelling, asymmetry, or associated skin lesions. No contractures.  Functional ROM: Unrestricted ROM                   Functional ROM: Unrestricted ROM                  Muscle Tone/Strength: Functionally intact. No obvious neuro-muscular anomalies detected.   Muscle Tone/Strength: Functionally intact. No obvious neuro-muscular anomalies detected.  Sensory (Neurological): Musculoskeletal pain pattern         Sensory (Neurological): Musculoskeletal pain pattern        DTR: Patellar: deferred today Achilles: deferred today Plantar: deferred today   DTR: Patellar: deferred today Achilles: deferred today Plantar: deferred today  Palpation: No palpable anomalies   Palpation: No palpable anomalies       Assessment   Diagnosis Status  1. Failed back surgical syndrome   2. Spinal cord stimulator status [employer], implanted 2017)   3. Chronic radicular lumbar pain   4. Pain management contract signed   5. Encounter for long-term opiate analgesic use   6. History of lumbar fusion (L5-S1 PSIF)   7. Chronic pain syndrome     Controlled Controlled Controlled     Plan of Care    Kimberly Cowan has a current medication list which includes the following long-term medication(s): fluticasone, furosemide, levocetirizine, rizatriptan, temazepam, and gabapentin.  Pharmacotherapy (Medications Ordered): Meds ordered this encounter  Medications   HYDROcodone-acetaminophen (NORCO) 7.5-325 MG tablet    Sig: Take 1 tablet by mouth at bedtime as needed for severe pain. Must last 30 days.    Dispense:  30 tablet    Refill:  0    Chronic Pain: STOP Act (Not applicable) Fill 1 day early if closed on refill date. Avoid benzodiazepines within 8 hours of opioids   HYDROcodone-acetaminophen (NORCO) 7.5-325 MG tablet    Sig: Take 1 tablet by mouth at bedtime as needed for severe pain. Must last 30 days.    Dispense:  30 tablet    Refill:  0    Chronic Pain: STOP Act (Not applicable) Fill 1 day early if closed on refill date. Avoid benzodiazepines within 8 hours of opioids   HYDROcodone-acetaminophen (NORCO) 7.5-325 MG tablet    Sig: Take 1 tablet by mouth at bedtime as needed for severe pain. Must last 30 days.    Dispense:  30 tablet    Refill:  0    Chronic Pain: STOP Act (Not applicable) Fill 1 day early if closed on refill date. Avoid benzodiazepines within 8 hours of opioids   gabapentin (NEURONTIN) 300 MG capsule    Sig: Take 2 capsules (600 mg total) by mouth at bedtime.    Dispense:  60 capsule    Refill:  5     Follow-up plan:   Return in about 3 months (around 12/31/2022) for Medication Management, in person.    I discussed the assessment and treatment plan with the patient. The patient was provided an opportunity to ask questions and all were answered. The patient agreed with the plan and demonstrated an understanding of the instructions.  Patient advised to call back or seek an in-person evaluation if the symptoms or condition worsens.  Duration of encounter: 61mnutes.  Total time on  encounter, as per AMA guidelines included  both the face-to-face and non-face-to-face time personally spent by the physician and/or other qualified health care professional(s) on the day of the encounter (includes time in activities that require the physician or other qualified health care professional and does not include time in activities normally performed by clinical staff). Physician's time may include the following activities when performed: preparing to see the patient (eg, review of tests, pre-charting review of records) obtaining and/or reviewing separately obtained history performing a medically appropriate examination and/or evaluation counseling and educating the patient/family/caregiver ordering medications, tests, or procedures referring and communicating with other health care professionals (when not separately reported) documenting clinical information in the electronic or other health record independently interpreting results (not separately reported) and communicating results to the patient/ family/caregiver care coordination (not separately reported)  Note by: Gillis Santa, MD Date: 10/01/2022; Time: 1:25 PM

## 2022-10-05 ENCOUNTER — Encounter: Payer: Self-pay | Admitting: Internal Medicine

## 2022-10-05 ENCOUNTER — Other Ambulatory Visit: Payer: Self-pay | Admitting: Internal Medicine

## 2022-10-05 ENCOUNTER — Ambulatory Visit (INDEPENDENT_AMBULATORY_CARE_PROVIDER_SITE_OTHER): Payer: BC Managed Care – PPO | Admitting: Internal Medicine

## 2022-10-05 VITALS — BP 128/74 | HR 74 | Ht 67.0 in | Wt 213.0 lb

## 2022-10-05 DIAGNOSIS — F411 Generalized anxiety disorder: Secondary | ICD-10-CM | POA: Diagnosis not present

## 2022-10-05 DIAGNOSIS — Z0000 Encounter for general adult medical examination without abnormal findings: Secondary | ICD-10-CM | POA: Diagnosis not present

## 2022-10-05 DIAGNOSIS — G43909 Migraine, unspecified, not intractable, without status migrainosus: Secondary | ICD-10-CM

## 2022-10-05 DIAGNOSIS — K219 Gastro-esophageal reflux disease without esophagitis: Secondary | ICD-10-CM

## 2022-10-05 DIAGNOSIS — Z9884 Bariatric surgery status: Secondary | ICD-10-CM | POA: Diagnosis not present

## 2022-10-05 DIAGNOSIS — G4733 Obstructive sleep apnea (adult) (pediatric): Secondary | ICD-10-CM | POA: Diagnosis not present

## 2022-10-05 DIAGNOSIS — Z1231 Encounter for screening mammogram for malignant neoplasm of breast: Secondary | ICD-10-CM | POA: Diagnosis not present

## 2022-10-05 MED ORDER — SERTRALINE HCL 50 MG PO TABS: 50.0000 mg | ORAL_TABLET | Freq: Every day | ORAL | 1 refills | Status: DC

## 2022-10-05 MED ORDER — RIZATRIPTAN BENZOATE 10 MG PO TBDP: ORAL_TABLET | ORAL | 0 refills | Status: DC

## 2022-10-05 NOTE — Progress Notes (Signed)
Date:  10/05/2022   Name:  Kimberly Cowan   DOB:  [birth year]   MRN:  [id number]   Chief Complaint: Annual Exam (Breast exam no pap) and Sleep Study (Pt wants a new sleep study due to CPAP machine being older than 5 years) Kimberly Cowan is a 57 y.o. female who presents today for her Complete Annual Exam. She feels well. She reports exercising walking daily. She reports she is sleeping fairly well. Breast complaints none.  Mammogram: 12/2021 DEXA: none Pap smear: discontinued Colonoscopy: 03/2018  Health Maintenance Due  Topic Date Due   Hepatitis C Screening  Never done   Medicare Annual Wellness (AWV)  07/30/2022    Immunization History  Administered Date(s) Administered   Influenza,inj,Quad PF,6+ Mos 08/14/2016, 11/15/2017, 08/15/2018, 07/07/2019, 07/24/2020, 07/30/2021   MMR 10/20/2002   PFIZER(Purple Top)SARS-COV-2 Vaccination 12/24/2019, 01/16/2020, 08/04/2020, 08/17/2021   Pneumococcal Polysaccharide-23 08/17/2021   Td 08/18/1991, 10/20/2002   Tdap 04/24/2014   Zoster Recombinat (Shingrix) 01/09/2019, 03/13/2019    Anxiety Presents for initial visit. The problem has been gradually worsening. Symptoms include excessive worry, nervous/anxious behavior and panic. Patient reports no chest pain, dizziness, palpitations or shortness of breath. Symptoms occur most days. The severity of symptoms is interfering with daily activities. The symptoms are aggravated by work stress and family issues. The quality of sleep is fair.   Past treatments include nothing.  Gastroesophageal Reflux She complains of heartburn. She reports no abdominal pain, no chest pain, no coughing or no wheezing. This is a recurrent problem. The problem occurs frequently. The problem has been unchanged. The heartburn is located in the substernum. The heartburn wakes her from sleep. Pertinent negatives include no fatigue. She has tried a PPI (also had gastric bypass with revision) for the  symptoms.  OSA - using cpap only several times per week.  Her mask does not fit well and she gets irritated.  She has lost a bit a more weight and probably needs to be refitted for a mask.  Lab Results  Component Value Date   NA 145 03/15/2022   K 3.1 (A) 03/15/2022   CO2 29 (A) 03/15/2022   GLUCOSE 90 12/16/2021   BUN 5 03/15/2022   CREATININE 0.6 03/15/2022   CALCIUM 9.7 03/15/2022   EGFR >90 03/15/2022   GFRNONAA >60 12/16/2021   Lab Results  Component Value Date   CHOL 135 02/19/2022   HDL 65 02/19/2022   LDLCALC 55 02/19/2022   TRIG 74 02/19/2022   CHOLHDL 1.8 10/02/2021   Lab Results  Component Value Date   TSH 0.89 02/19/2022   Lab Results  Component Value Date   HGBA1C 5.5 10/02/2021   Lab Results  Component Value Date   WBC 3.8 03/15/2022   HGB 11.5 (A) 03/15/2022   HCT 36 03/15/2022   MCV 92.2 12/16/2021   PLT 180 03/15/2022   Lab Results  Component Value Date   ALT 20 03/15/2022   AST 29 03/15/2022   ALKPHOS 99 03/15/2022   BILITOT 0.5 10/02/2021   Lab Results  Component Value Date   VD25OH 9 02/19/2022     Review of Systems  Constitutional:  Negative for chills, fatigue and fever.  HENT:  Negative for congestion, hearing loss, tinnitus, trouble swallowing and voice change.   Eyes:  Negative for visual disturbance.  Respiratory:  Negative for cough, chest tightness, shortness of breath and wheezing.   Cardiovascular:  Negative for chest pain, palpitations and leg swelling.  Gastrointestinal:  Positive for heartburn. Negative for abdominal pain, constipation, diarrhea and vomiting.       Gerd  Endocrine: Negative for polydipsia and polyuria.  Genitourinary:  Negative for dysuria, frequency, genital sores, vaginal bleeding and vaginal discharge.  Musculoskeletal:  Positive for back pain. Negative for arthralgias, gait problem and joint swelling.  Skin:  Negative for color change and rash.  Neurological:  Negative for dizziness, tremors,  light-headedness and headaches.  Hematological:  Negative for adenopathy. Does not bruise/bleed easily.  Psychiatric/Behavioral:  Positive for sleep disturbance. Negative for dysphoric mood. The patient is nervous/anxious.     Patient Active Problem List   Diagnosis Date Noted   Pain management contract signed 05/07/2022   Spinal cord stimulator status (Mark, implanted 2017) 04/28/2022   History of lumbar fusion (L5-S1 PSIF) 04/28/2022   Encounter for long-term opiate analgesic use 04/28/2022   Chronic pain syndrome 04/28/2022   High serum parathyroid hormone (PTH) 02/25/2022   Vitamin A deficiency 02/25/2022   Vitamin D deficiency 02/25/2022   Exocrine pancreatic insufficiency 02/19/2022   Degenerative spondylolisthesis 12/19/2021   Chronic GERD 03/03/2021   Status post hysterectomy 11/05/2020   Bariatric surgery status 01/01/2020   Primary insomnia 12/28/2019   Failed back surgical syndrome 11/27/2019   Mild peripheral edema 11/15/2017   Perforated ear drum, left 06/25/2017   History of pulmonary embolism 06/10/2017   Environmental and seasonal allergies 04/07/2017   Tinea versicolor 02/03/2017   Vasomotor symptoms due to menopause 11/11/2016   Glaucoma 09/28/2016   B12 nutritional deficiency 08/05/2016   OSA on CPAP 03/04/2016   Chronic depression 12/09/2015   Anxiety 12/09/2015   Chronic radicular lumbar pain 08/14/2015   Status post gastric bypass for obesity 07/04/2015   Prediabetes 07/01/2015   Spinal stenosis of lumbar region 12/10/2014   Headache, migraine 04/04/2012    Allergies  Allergen Reactions   Morphine And Related Hives, Shortness Of Breath, Nausea And Vomiting and Other (See Comments)    "can't breathe"   Penicillins Anaphylaxis, Hives, Shortness Of Breath and Nausea And Vomiting    "can't breathe", Has patient had a PCN reaction causing immediate rash, facial/tongue/throat swelling, SOB or lightheadedness with hypotension: Yes Has patient  had a PCN reaction causing severe rash involving mucus membranes or skin necrosis: No Has patient had a PCN reaction that required hospitalization No Has patient had a PCN reaction occurring within the last 10 years: No If all of the above answers are "NO", then may proceed with Cephalosporin use.    Latex Hives   Baclofen Nausea Only    Jittery, anxious    Past Surgical History:  Procedure Laterality Date   ABDOMINAL HYSTERECTOMY  10/2006   BACK SURGERY  2014   lumbar fusion   BACK SURGERY  2009   discectomy   BARIATRIC SURGERY  07/2020   Revision   [place]   CHOLECYSTECTOMY  01/2021   COLONOSCOPY WITH PROPOFOL N/A 04/12/2018   Procedure: COLONOSCOPY WITH PROPOFOL;  Surgeon: Lin Landsman, MD;  Location: [place];  Service: Gastroenterology;  Laterality: N/A;   GASTRIC BYPASS  2005   KNEE ARTHROSCOPY WITH LATERAL MENISECTOMY Left 05/27/2017   Procedure: KNEE ARTHROSCOPY WITH LATERAL MENISECTOMY;  Surgeon: Hessie Knows, MD;  Location: [facility];  Service: Orthopedics;  Laterality: Left;   KNEE ARTHROSCOPY WITH LATERAL RELEASE Left 05/27/2017   Procedure: KNEE ARTHROSCOPY WITH LATERAL RELEASE;  Surgeon: Hessie Knows, MD;  Location: [facility];  Service: Orthopedics;  Laterality: Left;  LAPAROSCOPIC SALPINGO OOPHERECTOMY Bilateral 10/05/2016   Procedure: LAPAROSCOPIC SALPINGO OOPHORECTOMY;  Surgeon: Brayton Mars, MD;  Location: [facility];  Service: Gynecology;  Laterality: Bilateral;   REDUCTION MAMMAPLASTY Bilateral    2001   SPINAL CORD STIMULATOR INSERTION N/A 03/20/2016   Procedure: LUMBAR SPINAL CORD STIMULATOR INSERTION;  Surgeon: Eustace Moore, MD;  Location: [place] [facility];  Service: Neurosurgery;  Laterality: N/A;   SPINE SURGERY  2009 2014 2017   TONSILLECTOMY      Social History   Tobacco Use   Smoking status: Never   Smokeless tobacco: Never   Tobacco comments:    smoking cessation materials not required  Vaping Use   Vaping  Use: Never used  Substance Use Topics   Alcohol use: No   Drug use: No     Medication list has been reviewed and updated.  Current Meds  Medication Sig   cyanocobalamin (VITAMIN B12) 1000 MCG/ML injection ADMINISTER 1 ML(1000 MCG) IN THE MUSCLE EVERY 30 DAYS   cyclobenzaprine (FLEXERIL) 10 MG tablet Take 10 mg by mouth daily as needed for muscle spasms.   diphenoxylate-atropine (LOMOTIL) 2.5-0.025 MG tablet Take 2 tablets by mouth 4 (four) times daily.   fluticasone (FLONASE) 50 MCG/ACT nasal spray Place 1 spray into both nostrils daily as needed for allergies.   furosemide (LASIX) 20 MG tablet TAKE 1 TABLET(20 MG) BY MOUTH DAILY   gabapentin (NEURONTIN) 300 MG capsule Take 2 capsules (600 mg total) by mouth at bedtime.   [START ON 10/07/2022] HYDROcodone-acetaminophen (NORCO) 7.5-325 MG tablet Take 1 tablet by mouth at bedtime as needed for severe pain. Must last 30 days.   [START ON 11/06/2022] HYDROcodone-acetaminophen (NORCO) 7.5-325 MG tablet Take 1 tablet by mouth at bedtime as needed for severe pain. Must last 30 days.   [START ON 12/06/2022] HYDROcodone-acetaminophen (NORCO) 7.5-325 MG tablet Take 1 tablet by mouth at bedtime as needed for severe pain. Must last 30 days.   latanoprost (XALATAN) 0.005 % ophthalmic solution Place 1 drop into both eyes at bedtime.    levocetirizine (XYZAL) 5 MG tablet Take 1 tablet (5 mg total) by mouth every evening.   Multiple Vitamins-Minerals (MULTIVITAMIN GUMMIES ADULTS PO) Take 1 tablet by mouth daily.   pantoprazole (PROTONIX) 40 MG tablet Take 40 mg by mouth 2 (two) times daily.   potassium chloride (MICRO-K) 10 MEQ CR capsule Take 10 mEq by mouth daily.   rizatriptan (MAXALT-MLT) 10 MG disintegrating tablet DISSOLVE 1 TABLET BY MOUTH AS NEEDED FOR MIGRAINE- MAY REPEAT IN 2 HOURS AS NEEDED   scopolamine (TRANSDERM-SCOP) 1 MG/3DAYS Place 1 patch onto the skin every 3 (three) days. As needed for nausea   sertraline (ZOLOFT) 50 MG tablet Take 1  tablet (50 mg total) by mouth daily.   temazepam (RESTORIL) 15 MG capsule TAKE 1 CAPSULE(15 MG) BY MOUTH AT BEDTIME AS NEEDED FOR SLEEP       10/05/2022    8:07 AM 10/24/2021    1:35 PM 10/02/2021    8:02 AM 11/25/2020    9:14 AM  GAD 7 : Generalized Anxiety Score  Nervous, Anxious, on Edge '2 1 1 '$ 0  Control/stop worrying 2 0 1 0  Worry too much - different things '2 1 1 '$ 0  Trouble relaxing '2 3 1 '$ 0  Restless 2 0 1 0  Easily annoyed or irritable '2 1 1 '$ 0  Afraid - awful might happen '3 1 1 '$ 0  Total GAD 7 Score '15 7 7 '$ 0  Anxiety  Difficulty Very difficult  Not difficult at all        10/05/2022    8:07 AM 10/24/2021    1:34 PM 10/02/2021    8:02 AM  Depression screen PHQ 2/9  Decreased Interest 0 0 0  Down, Depressed, Hopeless 3 0 0  PHQ - 2 Score 3 0 0  Altered sleeping '1 3 3  '$ Tired, decreased energy 1 0 1  Change in appetite 0 0 0  Feeling bad or failure about yourself  2 0 0  Trouble concentrating 1 0 0  Moving slowly or fidgety/restless 0 0 0  Suicidal thoughts 0 0 0  PHQ-9 Score '8 3 4  '$ Difficult doing work/chores Somewhat difficult Not difficult at all Not difficult at all    BP Readings from Last 3 Encounters:  10/05/22 128/74  10/01/22 126/79  09/25/22 124/78    Physical Exam Vitals and nursing note reviewed.  Constitutional:      General: She is not in acute distress.    Appearance: She is well-developed.  HENT:     Head: Normocephalic and atraumatic.     Right Ear: Tympanic membrane and ear canal normal.     Left Ear: Tympanic membrane and ear canal normal.     Nose:     Right Sinus: No maxillary sinus tenderness.     Left Sinus: No maxillary sinus tenderness.  Eyes:     General: No scleral icterus.       Right eye: No discharge.        Left eye: No discharge.     Conjunctiva/sclera: Conjunctivae normal.  Neck:     Thyroid: No thyromegaly.     Vascular: No carotid bruit.  Cardiovascular:     Rate and Rhythm: Normal rate and regular rhythm.     Pulses:  Normal pulses.     Heart sounds: Normal heart sounds.  Pulmonary:     Effort: Pulmonary effort is normal. No respiratory distress.     Breath sounds: No wheezing.  Chest:  Breasts:    Right: No mass, nipple discharge, skin change or tenderness.     Left: No mass, nipple discharge, skin change or tenderness.     Comments: Post surgical changes to breasts Abdominal:     General: Bowel sounds are normal.     Palpations: Abdomen is soft.     Tenderness: There is no abdominal tenderness.  Musculoskeletal:     Cervical back: Normal range of motion. No erythema.     Right lower leg: No edema.     Left lower leg: No edema.  Lymphadenopathy:     Cervical: No cervical adenopathy.  Skin:    General: Skin is warm and dry.     Findings: No rash.  Neurological:     Mental Status: She is alert and oriented to person, place, and time.     Cranial Nerves: No cranial nerve deficit.     Sensory: No sensory deficit.     Deep Tendon Reflexes: Reflexes are normal and symmetric.  Psychiatric:        Attention and Perception: Attention normal.        Mood and Affect: Mood normal.     Wt Readings from Last 3 Encounters:  10/05/22 213 lb (96.6 kg)  10/01/22 215 lb (97.5 kg)  09/25/22 215 lb (97.5 kg)    BP 128/74   Pulse 74   Ht '5\' 7"'$  (1.702 m)   Wt 213 lb (96.6 kg)  SpO2 96%   BMI 33.36 kg/m   Assessment and Plan: Problem List Items Addressed This Visit       Respiratory   OSA on CPAP (Chronic)    On CPAP since 2017 Sleeps well without equipment issues but only using it several times per week - mask does not fit as well since recent weight loss. Uses [employer] - recommend she contact them to get mask refitting as they will likely not cover a new machine without improved compliance.        Digestive   Chronic GERD (Chronic)    Recurrent symptoms despite Protonix bid Followed by GI - supplementing with Gas-X       Relevant Medications   pantoprazole (PROTONIX) 40 MG  tablet     Other   Bariatric surgery status (Chronic)   Relevant Orders   CBC with Differential/Platelet   Comprehensive metabolic panel   Other Visit Diagnoses     Annual physical exam    -  Primary   Relevant Orders   CBC with Differential/Platelet   Comprehensive metabolic panel   TSH   Encounter for screening mammogram for breast cancer       Relevant Orders   MM 3D SCREEN BREAST BILATERAL   Generalized anxiety disorder       Worsening symptoms since family death last year and work stresses Not a good candidate for anxiolytics due to sedation Will start Sertraline; f/u 1 mo   Relevant Medications   sertraline (ZOLOFT) 50 MG tablet   Other Relevant Orders   TSH        Partially dictated using Editor, commissioning. Any errors are unintentional.  Halina Maidens, MD [facility]  10/05/2022

## 2022-10-05 NOTE — Assessment & Plan Note (Signed)
Recurrent symptoms despite Protonix bid Followed by GI - supplementing with Gas-X

## 2022-10-05 NOTE — Patient Instructions (Addendum)
Take Vitamin D daily.  Call [facility] to schedule your mammogram at [phone].  Take Sertraline daily and follow up in one month.  Call [facility] to find out how to try other sleep masks. Phone: [phone]

## 2022-10-05 NOTE — Assessment & Plan Note (Addendum)
On CPAP since 2017 Sleeps well without equipment issues but only using it several times per week - mask does not fit as well since recent weight loss. [facility]ses [employer] - recommend she contact them to get mask refitting as they will likely not cover a new machine without improved compliance.

## 2022-10-09 LAB — CBC WITH DIFFERENTIAL/PLATELET
Basophils Absolute: 0 10*3/uL (ref 0.0–0.2)
Basos: 1 %
EOS (ABSOLUTE): 0.1 10*3/uL (ref 0.0–0.4)
Eos: 3 %
Hematocrit: 40.2 % (ref 34.0–46.6)
Hemoglobin: 12.6 g/dL (ref 11.1–15.9)
Immature Grans (Abs): 0 10*3/uL (ref 0.0–0.1)
Immature Granulocytes: 0 %
Lymphocytes Absolute: 2.9 10*3/uL (ref 0.7–3.1)
Lymphs: 56 %
MCH: 29.3 pg (ref 26.6–33.0)
MCHC: 31.3 g/dL — ABNORMAL LOW (ref 31.5–35.7)
MCV: 94 fL (ref 79–97)
Monocytes Absolute: 0.4 10*3/uL (ref 0.1–0.9)
Monocytes: 7 %
Neutrophils Absolute: 1.6 10*3/uL (ref 1.4–7.0)
Neutrophils: 33 %
Platelets: 260 10*3/uL (ref 150–450)
RBC: 4.3 x10E6/uL (ref 3.77–5.28)
RDW: 12.8 % (ref 11.7–15.4)
WBC: 5 10*3/uL (ref 3.4–10.8)

## 2022-10-09 LAB — COMPREHENSIVE METABOLIC PANEL
ALT: 10 IU/L (ref 0–32)
AST: 10 IU/L (ref 0–40)
Albumin/Globulin Ratio: 1.3 (ref 1.2–2.2)
Albumin: 3.8 g/dL (ref 3.8–4.9)
Alkaline Phosphatase: 99 IU/L (ref 44–121)
BUN/Creatinine Ratio: 17 (ref 9–23)
BUN: 12 mg/dL (ref 6–24)
Bilirubin Total: 0.9 mg/dL (ref 0.0–1.2)
CO2: 24 mmol/L (ref 20–29)
Calcium: 9.8 mg/dL (ref 8.7–10.2)
Chloride: 106 mmol/L (ref 96–106)
Creatinine, Ser: 0.71 mg/dL (ref 0.57–1.00)
Globulin, Total: 3 g/dL (ref 1.5–4.5)
Glucose: 86 mg/dL (ref 70–99)
Potassium: 4 mmol/L (ref 3.5–5.2)
Sodium: 143 mmol/L (ref 134–144)
Total Protein: 6.8 g/dL (ref 6.0–8.5)
eGFR: 101 mL/min/{1.73_m2} (ref >59)

## 2022-10-09 LAB — TSH: TSH: 2.15 u[IU]/mL (ref 0.450–4.500)

## 2022-10-12 ENCOUNTER — Encounter: Payer: Self-pay | Admitting: Student in an Organized Health Care Education/Training Program

## 2022-10-14 ENCOUNTER — Ambulatory Visit: Payer: Medicare Other | Admitting: Internal Medicine

## 2022-10-20 ENCOUNTER — Other Ambulatory Visit: Payer: Self-pay | Admitting: Internal Medicine

## 2022-10-20 DIAGNOSIS — F5101 Primary insomnia: Secondary | ICD-10-CM

## 2022-10-20 NOTE — Telephone Encounter (Signed)
Requested medication (s) are due for refill today: yes  Requested medication (s) are on the active medication list: yes  Last refill:  09/22/22 #30 0 refills  Future visit scheduled: yes in 2 weeks  Notes to clinic:  not delegated per protocol. Do you want to refill rx?     Requested Prescriptions  Pending Prescriptions Disp Refills   temazepam (RESTORIL) 15 MG capsule [Pharmacy Med Name: TEMAZEPAM '15MG'$  CAPSULES] 30 capsule     Sig: TAKE 1 CAPSULE(15 MG) BY MOUTH AT BEDTIME AS NEEDED FOR SLEEP     Not Delegated - Psychiatry: Anxiolytics/Hypnotics 2 Failed - 10/20/2022  2:26 PM      Failed - This refill cannot be delegated      Failed - Urine Drug Screen completed in last 360 days      Passed - Patient is not pregnant      Passed - Valid encounter within last 6 months    Recent Outpatient Visits           2 weeks ago Annual physical exam   [place] at [facility], Jesse Sans, MD   3 weeks ago Influenza A   [place] [facility] [place] [facility], Jesse Sans, MD   12 months ago Diarrhea of presumed infectious origin   [place] at [facility], Jesse Sans, MD   1 year ago Annual physical exam   [place] at [facility], Jesse Sans, MD   1 year ago Prediabetes   [place] at [facility], Jesse Sans, MD       Future Appointments             In 2 weeks Army Melia, Jesse Sans, MD [place] at [facility], [facility]

## 2022-10-21 MED ORDER — TEMAZEPAM 15 MG PO CAPS: 15.0000 mg | ORAL_CAPSULE | Freq: Every evening | ORAL | 5 refills | Status: DC | PRN

## 2022-11-06 ENCOUNTER — Encounter: Payer: Self-pay | Admitting: Internal Medicine

## 2022-11-06 ENCOUNTER — Ambulatory Visit (INDEPENDENT_AMBULATORY_CARE_PROVIDER_SITE_OTHER): Payer: BC Managed Care – PPO | Admitting: Internal Medicine

## 2022-11-06 VITALS — BP 114/64 | HR 80 | Ht 67.0 in | Wt 213.0 lb

## 2022-11-06 DIAGNOSIS — F411 Generalized anxiety disorder: Secondary | ICD-10-CM

## 2022-11-06 MED ORDER — SERTRALINE HCL 100 MG PO TABS: 100.0000 mg | ORAL_TABLET | Freq: Every day | ORAL | 1 refills | Status: DC

## 2022-11-06 NOTE — Assessment & Plan Note (Addendum)
On Sertraline 50 mg daily for the past month. Improved symptoms but will some residual Increase to 75 mg daily for weeks then begin 100 mg daily Follow up as needed

## 2022-11-06 NOTE — Progress Notes (Signed)
Date:  11/06/2022   Name:  Kimberly Cowan   DOB:  01/01/1966   MRN:  [id number]   Chief Complaint: Anxiety  Depression        This is a new problem.  The current episode started more than 1 month ago.   The onset quality is gradual.   Associated symptoms include no fatigue.     The symptoms are aggravated by family issues and work stress.  Past treatments include SSRIs - Selective serotonin reuptake inhibitors (started zoloft last visit).  Compliance with treatment is good.  Past medical history includes anxiety.   Anxiety Presents for follow-up visit. Symptoms include nervous/anxious behavior and panic (much fewer panic attacks). Patient reports no nausea or shortness of breath. Symptoms occur most days. The quality of sleep is good.   Compliance with medications is 76-100%.    Lab Results  Component Value Date   NA 143 10/08/2022   K 4.0 10/08/2022   CO2 24 10/08/2022   GLUCOSE 86 10/08/2022   BUN 12 10/08/2022   CREATININE 0.71 10/08/2022   CALCIUM 9.8 10/08/2022   EGFR 101 10/08/2022   GFRNONAA >60 12/16/2021   Lab Results  Component Value Date   CHOL 135 02/19/2022   HDL 65 02/19/2022   LDLCALC 55 02/19/2022   TRIG 74 02/19/2022   CHOLHDL 1.8 10/02/2021   Lab Results  Component Value Date   TSH 2.150 10/08/2022   Lab Results  Component Value Date   HGBA1C 5.5 10/02/2021   Lab Results  Component Value Date   WBC 5.0 10/08/2022   HGB 12.6 10/08/2022   HCT 40.2 10/08/2022   MCV 94 10/08/2022   PLT 260 10/08/2022   Lab Results  Component Value Date   ALT 10 10/08/2022   AST 10 10/08/2022   ALKPHOS 99 10/08/2022   BILITOT 0.9 10/08/2022   Lab Results  Component Value Date   VD25OH 9 02/19/2022     Review of Systems  Constitutional:  Negative for chills, fatigue, fever and unexpected weight change.  Respiratory:  Negative for chest tightness and shortness of breath.   Gastrointestinal:  Negative for abdominal pain, constipation, nausea  and vomiting.  Musculoskeletal:  Positive for back pain.  Psychiatric/Behavioral:  Positive for depression and dysphoric mood. Negative for sleep disturbance. The patient is nervous/anxious.     Patient Active Problem List   Diagnosis Date Noted   Generalized anxiety disorder 10/05/2022   Pain management contract signed 05/07/2022   Spinal cord stimulator status ([place], implanted 2017) 04/28/2022   History of lumbar fusion (L5-S1 PSIF) 04/28/2022   Encounter for long-term opiate analgesic use 04/28/2022   Chronic pain syndrome 04/28/2022   High serum parathyroid hormone (PTH) 02/25/2022   Vitamin A deficiency 02/25/2022   Vitamin D deficiency 02/25/2022   Exocrine pancreatic insufficiency 02/19/2022   Degenerative spondylolisthesis 12/19/2021   Chronic GERD 03/03/2021   Status post hysterectomy 11/05/2020   Bariatric surgery status 01/01/2020   Primary insomnia 12/28/2019   Failed back surgical syndrome 11/27/2019   Mild peripheral edema 11/15/2017   Perforated ear drum, left 06/25/2017   History of pulmonary embolism 06/10/2017   Environmental and seasonal allergies 04/07/2017   Tinea versicolor 02/03/2017   Vasomotor symptoms due to menopause 11/11/2016   Glaucoma 09/28/2016   B12 nutritional deficiency 08/05/2016   OSA on CPAP 03/04/2016   Chronic depression 12/09/2015   Anxiety 12/09/2015   Chronic radicular lumbar pain 08/14/2015   Status post gastric bypass  for obesity 07/04/2015   Prediabetes 07/01/2015   Spinal stenosis of lumbar region 12/10/2014   Headache, migraine 04/04/2012    Allergies  Allergen Reactions   Morphine And Related Hives, Shortness Of Breath, Nausea And Vomiting and Other (See Comments)    "can't breathe"   Penicillins Anaphylaxis, Hives, Shortness Of Breath and Nausea And Vomiting    "can't breathe", Has patient had a PCN reaction causing immediate rash, facial/tongue/throat swelling, SOB or lightheadedness with hypotension:  Yes Has patient had a PCN reaction causing severe rash involving mucus membranes or skin necrosis: No Has patient had a PCN reaction that required hospitalization No Has patient had a PCN reaction occurring within the last 10 years: No If all of the above answers are "NO", then may proceed with Cephalosporin use.    Latex Hives   Baclofen Nausea Only    Jittery, anxious    Past Surgical History:  Procedure Laterality Date   ABDOMINAL HYSTERECTOMY  10/2006   BACK SURGERY  2014   lumbar fusion   BACK SURGERY  2009   discectomy   BARIATRIC SURGERY  07/2020   Revision   [place]   CHOLECYSTECTOMY  01/2021   COLONOSCOPY WITH PROPOFOL N/A 04/12/2018   Procedure: COLONOSCOPY WITH PROPOFOL;  Surgeon: Lin Landsman, MD;  Location: [facility];  Service: Gastroenterology;  Laterality: N/A;   GASTRIC BYPASS  2005   KNEE ARTHROSCOPY WITH LATERAL MENISECTOMY Left 05/27/2017   Procedure: KNEE ARTHROSCOPY WITH LATERAL MENISECTOMY;  Surgeon: Hessie Knows, MD;  Location: [facility];  Service: Orthopedics;  Laterality: Left;   KNEE ARTHROSCOPY WITH LATERAL RELEASE Left 05/27/2017   Procedure: KNEE ARTHROSCOPY WITH LATERAL RELEASE;  Surgeon: Hessie Knows, MD;  Location: [facility];  Service: Orthopedics;  Laterality: Left;   LAPAROSCOPIC SALPINGO OOPHERECTOMY Bilateral 10/05/2016   Procedure: LAPAROSCOPIC SALPINGO OOPHORECTOMY;  Surgeon: Brayton Mars, MD;  Location: [facility];  Service: Gynecology;  Laterality: Bilateral;   REDUCTION MAMMAPLASTY Bilateral    2001   SPINAL CORD STIMULATOR INSERTION N/A 03/20/2016   Procedure: LUMBAR SPINAL CORD STIMULATOR INSERTION;  Surgeon: Eustace Moore, MD;  Location: [facility];  Service: Neurosurgery;  Laterality: N/A;   SPINE SURGERY  2009 2014 2017   TONSILLECTOMY      Social History   Tobacco Use   Smoking status: Never   Smokeless tobacco: Never   Tobacco comments:    smoking cessation materials not required   Vaping Use   Vaping Use: Never used  Substance Use Topics   Alcohol use: No   Drug use: No     Medication list has been reviewed and updated.  Current Meds  Medication Sig   cyanocobalamin (VITAMIN B12) 1000 MCG/ML injection ADMINISTER 1 ML(1000 MCG) IN THE MUSCLE EVERY 30 DAYS   cyclobenzaprine (FLEXERIL) 10 MG tablet Take 10 mg by mouth daily as needed for muscle spasms.   diphenoxylate-atropine (LOMOTIL) 2.5-0.025 MG tablet Take 2 tablets by mouth 4 (four) times daily.   fluticasone (FLONASE) 50 MCG/ACT nasal spray Place 1 spray into both nostrils daily as needed for allergies.   furosemide (LASIX) 20 MG tablet TAKE 1 TABLET(20 MG) BY MOUTH DAILY   gabapentin (NEURONTIN) 300 MG capsule Take 2 capsules (600 mg total) by mouth at bedtime.   HYDROcodone-acetaminophen (NORCO) 7.5-325 MG tablet Take 1 tablet by mouth at bedtime as needed for severe pain. Must last 30 days.   HYDROcodone-acetaminophen (NORCO) 7.5-325 MG tablet Take 1 tablet by mouth at  bedtime as needed for severe pain. Must last 30 days.   [START ON 12/06/2022] HYDROcodone-acetaminophen (NORCO) 7.5-325 MG tablet Take 1 tablet by mouth at bedtime as needed for severe pain. Must last 30 days.   latanoprost (XALATAN) 0.005 % ophthalmic solution Place 1 drop into both eyes at bedtime.    levocetirizine (XYZAL) 5 MG tablet Take 1 tablet (5 mg total) by mouth every evening.   Multiple Vitamins-Minerals (MULTIVITAMIN GUMMIES ADULTS PO) Take 1 tablet by mouth daily.   pantoprazole (PROTONIX) 40 MG tablet Take 40 mg by mouth 2 (two) times daily.   potassium chloride (MICRO-K) 10 MEQ CR capsule Take 10 mEq by mouth daily.   rizatriptan (MAXALT-MLT) 10 MG disintegrating tablet DISSOLVE 1 TABLET BY MOUTH AS NEEDED FOR MIGRAINE- MAY REPEAT IN 2 HOURS AS NEEDED   temazepam (RESTORIL) 15 MG capsule Take 1 capsule (15 mg total) by mouth at bedtime as needed for sleep.   [DISCONTINUED] scopolamine (TRANSDERM-SCOP) 1 MG/3DAYS Place 1 patch  onto the skin every 3 (three) days. As needed for nausea   [DISCONTINUED] sertraline (ZOLOFT) 50 MG tablet Take 1 tablet (50 mg total) by mouth daily.       11/06/2022    3:26 PM 10/05/2022    8:07 AM 10/24/2021    1:35 PM 10/02/2021    8:02 AM  GAD 7 : Generalized Anxiety Score  Nervous, Anxious, on Edge 2 2 1 1  $ Control/stop worrying 1 2 0 1  Worry too much - different things 2 2 1 1  $ Trouble relaxing 3 2 3 1  $ Restless 2 2 0 1  Easily annoyed or irritable 1 2 1 1  $ Afraid - awful might happen 2 3 1 1  $ Total GAD 7 Score 13 15 7 7  $ Anxiety Difficulty Somewhat difficult Very difficult  Not difficult at all       11/06/2022    3:26 PM 10/05/2022    8:07 AM 10/24/2021    1:34 PM  Depression screen PHQ 2/9  Decreased Interest 0 0 0  Down, Depressed, Hopeless 1 3 0  PHQ - 2 Score 1 3 0  Altered sleeping 3 1 3  $ Tired, decreased energy 1 1 0  Change in appetite 0 0 0  Feeling bad or failure about yourself  0 2 0  Trouble concentrating 0 1 0  Moving slowly or fidgety/restless 0 0 0  Suicidal thoughts 0 0 0  PHQ-9 Score 5 8 3  $ Difficult doing work/chores Somewhat difficult Somewhat difficult Not difficult at all    BP Readings from Last 3 Encounters:  11/06/22 114/64  10/05/22 128/74  10/01/22 126/79    Physical Exam Vitals and nursing note reviewed.  Constitutional:      General: She is not in acute distress.    Appearance: She is well-developed.  HENT:     Head: Normocephalic and atraumatic.  Cardiovascular:     Rate and Rhythm: Normal rate and regular rhythm.  Pulmonary:     Effort: Pulmonary effort is normal. No respiratory distress.     Breath sounds: No wheezing or rhonchi.  Musculoskeletal:     Right lower leg: No edema.     Left lower leg: No edema.  Skin:    General: Skin is warm and dry.     Findings: No rash.  Neurological:     Mental Status: She is alert and oriented to person, place, and time.  Psychiatric:        Mood and Affect:  Mood normal.         Behavior: Behavior normal.     Wt Readings from Last 3 Encounters:  11/06/22 213 lb (96.6 kg)  10/05/22 213 lb (96.6 kg)  10/01/22 215 lb (97.5 kg)    BP 114/64   Pulse 80   Ht 5' 7"$  (1.702 m)   Wt 213 lb (96.6 kg)   SpO2 95%   BMI 33.36 kg/m   Assessment and Plan: Problem List Items Addressed This Visit       Other   Generalized anxiety disorder - Primary (Chronic)    On Sertraline 50 mg daily for the past month. Improved symptoms but will some residual Increase to 75 mg daily for weeks then begin 100 mg daily Follow up as needed       Relevant Medications   sertraline (ZOLOFT) 100 MG tablet     Partially dictated using Editor, commissioning. Any errors are unintentional.  Halina Maidens, MD [facility]  11/06/2022

## 2022-11-16 ENCOUNTER — Ambulatory Visit: Payer: Medicare Other

## 2022-11-25 ENCOUNTER — Ambulatory Visit (INDEPENDENT_AMBULATORY_CARE_PROVIDER_SITE_OTHER): Payer: 59

## 2022-11-25 VITALS — Ht 67.0 in | Wt 213.0 lb

## 2022-11-25 DIAGNOSIS — Z0000 Encounter for general adult medical examination without abnormal findings: Secondary | ICD-10-CM

## 2022-11-25 NOTE — Progress Notes (Signed)
I connected with  Kimberly Cowan on 11/25/22 by a audio enabled telemedicine application and verified that I am speaking with the correct person using two identifiers.  Patient Location: Home  Provider Location: Office/Clinic  I discussed the limitations of evaluation and management by telemedicine. The patient expressed understanding and agreed to proceed.  Subjective:   Kimberly Cowan is a 57 y.o. female who presents for Medicare Annual (Subsequent) preventive examination.  Review of Systems     Cardiac Risk Factors include: advanced age (>2mn, >>27women)     Objective:    There were no vitals filed for this visit. There is no height or weight on file to calculate BMI.     11/25/2022    3:33 PM 05/07/2022    2:52 PM 04/28/2022    1:10 PM 03/15/2022    8:45 AM 12/16/2021   10:01 AM 07/30/2021    3:48 PM 03/03/2021    7:26 AM  Advanced Directives  Does Patient Have a Medical Advance Directive? No No No;Yes No Yes Yes Yes  Type of Advance Directive     [place] Does patient want to make changes to medical advance directive?   No - Patient declined      Copy of [place] Chart?      No - copy requested   Would patient like information on creating a medical advance directive? No - Patient declined No - Patient declined No - Patient declined        Current Medications (verified) Outpatient Encounter Medications as of 11/25/2022  Medication Sig   cyanocobalamin (VITAMIN B12) 1000 MCG/ML injection ADMINISTER 1 ML(1000 MCG) IN THE MUSCLE EVERY 30 DAYS   cyclobenzaprine (FLEXERIL) 10 MG tablet Take 10 mg by mouth daily as needed for muscle spasms.   diphenoxylate-atropine (LOMOTIL) 2.5-0.025 MG tablet Take 2 tablets by mouth 4 (four) times daily.   fluticasone (FLONASE) 50 MCG/ACT nasal spray Place 1 spray into both nostrils daily as needed for allergies.    furosemide (LASIX) 20 MG tablet TAKE 1 TABLET(20 MG) BY MOUTH DAILY   gabapentin (NEURONTIN) 300 MG capsule Take 2 capsules (600 mg total) by mouth at bedtime.   HYDROcodone-acetaminophen (NORCO) 7.5-325 MG tablet Take 1 tablet by mouth at bedtime as needed for severe pain. Must last 30 days.   latanoprost (XALATAN) 0.005 % ophthalmic solution Place 1 drop into both eyes at bedtime.    Multiple Vitamins-Minerals (MULTIVITAMIN GUMMIES ADULTS PO) Take 1 tablet by mouth daily.   pantoprazole (PROTONIX) 40 MG tablet Take 40 mg by mouth 2 (two) times daily.   potassium chloride (MICRO-K) 10 MEQ CR capsule Take 10 mEq by mouth daily.   rizatriptan (MAXALT-MLT) 10 MG disintegrating tablet DISSOLVE 1 TABLET BY MOUTH AS NEEDED FOR MIGRAINE- MAY REPEAT IN 2 HOURS AS NEEDED   sertraline (ZOLOFT) 100 MG tablet Take 1 tablet (100 mg total) by mouth daily.   temazepam (RESTORIL) 15 MG capsule Take 1 capsule (15 mg total) by mouth at bedtime as needed for sleep.   levocetirizine (XYZAL) 5 MG tablet Take 1 tablet (5 mg total) by mouth every evening. (Patient not taking: Reported on 11/25/2022)   [DISCONTINUED] HYDROcodone-acetaminophen (NORCO) 7.5-325 MG tablet Take 1 tablet by mouth at bedtime as needed for severe pain. Must last 30 days.   No facility-administered encounter medications on file as of 11/25/2022.    Allergies (verified) Morphine and related, Penicillins,  Latex, and Baclofen   History: Past Medical History:  Diagnosis Date   Allergy 1999   SEASONAL   Anemia    Anxiety    Arm pain, left 05/17/2018   Arthritis    lower spine   Bilateral lumbar radiculopathy    Cellulitis of abdominal wall 03/03/2021   Chronic back pain    Chronic GERD 03/03/2021   Clinical depression 04/04/2012   Depression    DVT (deep venous thrombosis) ([place])    Encounter for screening colonoscopy    Essential (primary) hypertension 06/04/2020   Glaucoma    RIGHT EYE   Headache(784.0)    migraines   Major  depressive disorder with single episode, in full remission ([place]) 08/15/2018   Malfunction of jejunostomy tube ([place]) 02/13/2021   Ovarian cyst    Pneumonia    walking pneumonia   Pulmonary embolism ([place]) 05/2017   Provoked, following knee surgery   S/P laparoscopic cholecystectomy 02/07/2021   Sleep apnea    no cpcap     last sleep study > 4 yrs   Past Surgical History:  Procedure Laterality Date   ABDOMINAL HYSTERECTOMY  10/2006   BACK SURGERY  2014   lumbar fusion   BACK SURGERY  2009   discectomy   BARIATRIC SURGERY  07/2020   Revision   [place]  01/2021   COLONOSCOPY WITH PROPOFOL N/A 04/12/2018   Procedure: COLONOSCOPY WITH PROPOFOL;  Surgeon: Lin Landsman, MD;  Location: [facility];  Service: Gastroenterology;  Laterality: N/A;   GASTRIC BYPASS  2005   KNEE ARTHROSCOPY WITH LATERAL MENISECTOMY Left 05/27/2017   Procedure: KNEE ARTHROSCOPY WITH LATERAL MENISECTOMY;  Surgeon: Hessie Knows, MD;  Location: [facility];  Service: Orthopedics;  Laterality: Left;   KNEE ARTHROSCOPY WITH LATERAL RELEASE Left 05/27/2017   Procedure: KNEE ARTHROSCOPY WITH LATERAL RELEASE;  Surgeon: Hessie Knows, MD;  Location: [facility];  Service: Orthopedics;  Laterality: Left;   LAPAROSCOPIC SALPINGO OOPHERECTOMY Bilateral 10/05/2016   Procedure: LAPAROSCOPIC SALPINGO OOPHORECTOMY;  Surgeon: Brayton Mars, MD;  Location: [facility];  Service: Gynecology;  Laterality: Bilateral;   REDUCTION MAMMAPLASTY Bilateral    2001   SPINAL CORD STIMULATOR INSERTION N/A 03/20/2016   Procedure: LUMBAR SPINAL CORD STIMULATOR INSERTION;  Surgeon: Eustace Moore, MD;  Location: [facility];  Service: Neurosurgery;  Laterality: N/A;   [place] SURGERY  2009 2014 2017   TONSILLECTOMY     Family History  Problem Relation Age of Onset   Diabetes Mother    Hypertension Mother    Obesity Mother    Arthritis Mother    Varicose Veins Mother    Diabetes Sister     Hypertension Sister    Arthritis Sister    Hypertension Father    Arthritis Father    Cancer Father    Obesity Father    Obesity Daughter    Obesity Sister    Arthritis Sister    Obesity Sister    Diabetes Maternal Grandmother    Cancer Maternal Aunt    Diabetes Maternal Aunt    Breast cancer Neg Hx    Social History   Socioeconomic History   Marital status: Widowed    Spouse name: Not on file   Number of children: 2   Years of education: some college   Highest education level: 12th grade  Occupational History   Occupation: disabled  Tobacco Use   Smoking status: Never   Smokeless tobacco: Never  Tobacco comments:    smoking cessation materials not required  Vaping Use   Vaping Use: Never used  Substance and Sexual Activity   Alcohol use: No   Drug use: No   Sexual activity: Not Currently    Birth control/protection: Surgical  Other Topics Concern   Not on file  Social History Narrative   Pt lives alone   Social Determinants of Health   Financial Resource Strain: Low Risk  (11/25/2022)   Overall Financial Resource Strain (CARDIA)    Difficulty of Paying Living Expenses: Not hard at all  Food Insecurity: No Food Insecurity (11/25/2022)   Hunger Vital Sign    Worried About Running Out of Food in the Last Year: Never true    Ran Out of Food in the Last Year: Never true  Transportation Needs: No Transportation Needs (11/25/2022)   PRAPARE - Transportation    Lack of Transportation (Medical): No    Lack of Transportation (Non-Medical): No  Physical Activity: Sufficiently Active (11/25/2022)   Exercise Vital Sign    Days of Exercise per Week: 7 days    Minutes of Exercise per Session: 40 min  Stress: No Stress Concern Present (11/25/2022)   August    Feeling of Stress : Only a little  Social Connections: Moderately Isolated (11/25/2022)   Social Connection and Isolation Panel [NHANES]     Frequency of Communication with Friends and Family: More than three times a week    Frequency of Social Gatherings with Friends and Family: Three times a week    Attends Religious Services: More than 4 times per year    Active Member of Clubs or Organizations: No    Attends Archivist Meetings: Never    Marital Status: Widowed    Tobacco Counseling Counseling given: Not Answered Tobacco comments: smoking cessation materials not required   Clinical Intake:  Pre-visit preparation completed: Yes  Pain : No/denies pain     Nutritional Risks: None Diabetes: No  How often do you need to have someone help you when you read instructions, pamphlets, or other written materials from your doctor or pharmacy?: 1 - Never  Diabetic?no  Interpreter Needed?: No  Information entered by :: Kirke Shaggy, LPN   Activities of Daily Living    11/25/2022    3:34 PM 12/16/2021   10:03 AM  In your present state of health, do you have any difficulty performing the following activities:  Hearing? 0   Vision? 0   Difficulty concentrating or making decisions? 0   Walking or climbing stairs? 1   Dressing or bathing? 0   Doing errands, shopping? 0 0  Preparing Food and eating ? N   Using the Toilet? N   In the past six months, have you accidently leaked urine? N   Do you have problems with loss of bowel control? N   Managing your Medications? N   Managing your Finances? N   Housekeeping or managing your Housekeeping? N     Patient Care Team: Glean Hess, MD as PCP - General (Internal Medicine) Marlaine Hind, MD as Consulting Physician (Physical Medicine and Rehabilitation) Pilati, Youlanda Roys, MD as Referring Physician [facility])  Indicate any recent Medical Services you may have received from other than [facility] providers in the past year (date may be approximate).     Assessment:   This is a routine wellness examination for Kristal.  Hearing/Vision screen Hearing  Screening - Comments:: No  aids Vision Screening - Comments:: Wears glasses- Dr.Bell  Dietary issues and exercise activities discussed: Current Exercise Habits: Home exercise routine, Type of exercise: walking, Time (Minutes): 45, Frequency (Times/Week): 7, Weekly Exercise (Minutes/Week): 315, Intensity: Mild   Goals Addressed             This Visit's Progress    DIET - EAT MORE FRUITS AND VEGETABLES         Depression Screen    11/25/2022    3:31 PM 11/06/2022    3:26 PM 10/05/2022    8:07 AM 10/24/2021    1:34 PM 10/02/2021    8:02 AM 07/30/2021    3:38 PM 11/25/2020    9:14 AM  PHQ 2/9 Scores  PHQ - 2 Score 0 1 3 0 0 0 0  PHQ- 9 Score 0 '5 8 3 4 6 '$ 0    Fall Risk    11/25/2022    3:33 PM 11/06/2022    3:26 PM 10/05/2022    8:07 AM 10/01/2022    1:05 PM 07/28/2022    2:21 PM  Fall Risk   Falls in the past year? '1 1 1 '$ 0 1  Number falls in past yr: 1 0 1  1  Injury with Fall? 0 0 0  0  Risk for fall due to : History of fall(s) History of fall(s) History of fall(s)    Follow up Falls prevention discussed;Falls evaluation completed Falls evaluation completed Falls evaluation completed      FALL RISK PREVENTION PERTAINING TO THE HOME:  Any stairs in or around the home? Yes  If so, are there any without handrails? No  Home free of loose throw rugs in walkways, pet beds, electrical cords, etc? Yes  Adequate lighting in your home to reduce risk of falls? Yes   ASSISTIVE DEVICES UTILIZED TO PREVENT FALLS:  Life alert? No  Use of a cane, walker or w/c? Yes  sometimes the cane Grab bars in the bathroom? Yes  Shower chair or bench in shower? No  Elevated toilet seat or a handicapped toilet? No   Cognitive Function:        11/25/2022    3:38 PM 03/13/2019    9:48 AM 03/09/2018    8:49 AM  6CIT Screen  What Year? 0 points 0 points 0 points  What month? 0 points 0 points 0 points  What time? 0 points 0 points 0 points  Count back from 20 0 points 0 points 0 points  Months in  reverse 0 points 0 points 0 points  Repeat phrase 0 points 0 points 0 points  Total Score 0 points 0 points 0 points    Immunizations Immunization History  Administered Date(s) Administered   Influenza,inj,Quad PF,6+ Mos 08/14/2016, 11/15/2017, 08/15/2018, 07/07/2019, 07/24/2020, 07/30/2021   MMR 10/20/2002   PFIZER(Purple Top)SARS-COV-2 Vaccination 12/24/2019, 01/16/2020, 08/04/2020, 08/17/2021   Pneumococcal Polysaccharide-23 08/17/2021   Td 08/18/1991, 10/20/2002   Tdap 04/24/2014   Zoster Recombinat (Shingrix) 01/09/2019, 03/13/2019    TDAP status: Up to date  Flu Vaccine status: Up to date  Pneumococcal vaccine status: Up to date  Covid-19 vaccine status: Completed vaccines  Qualifies for Shingles Vaccine? Yes   Zostavax completed No   Shingrix Completed?: Yes  Screening Tests Health Maintenance  Topic Date Due   Hepatitis C Screening  Never done   COVID-19 Vaccine (5 - 2023-24 season) 05/29/2022   INFLUENZA VACCINE  12/27/2022 (Originally 04/28/2022)   MAMMOGRAM  01/03/2023   Medicare Annual Wellness (  AWV)  11/26/2023   DTaP/Tdap/Td (4 - Td or Tdap) 04/24/2024   COLONOSCOPY (Pts 45-33yr Insurance coverage will need to be confirmed)  04/12/2028   HIV Screening  Completed   Zoster Vaccines- Shingrix  Completed   Pneumococcal Vaccine [id number] old  Aged Out   HPV [place] Due  Topic Date Due   Hepatitis C Screening  Never done   COVID-19 Vaccine (5 - 2023-24 season) 05/29/2022    Colorectal cancer screening: Type of screening: Colonoscopy. Completed 04/12/18. Repeat every 10 years  Mammogram status: Ordered 10/05/22. Pt provided with contact info and advised to call to schedule appt. - will call to make appointment   Lung Cancer Screening: (Low Dose CT Chest recommended if Age 130-80years, 30 pack-year currently smoking OR have quit w/in 15years.) does not qualify.     Additional Screening:  Hepatitis  C Screening: does qualify; Completed no  Vision Screening: Recommended annual ophthalmology exams for early detection of glaucoma and other disorders of the eye. Is the patient up to date with their annual eye exam?  Yes  Who is the provider or what is the name of the office in which the patient attends annual eye exams? Dr.Bell If pt is not established with a provider, would they like to be referred to a provider to establish care? No .   Dental Screening: Recommended annual dental exams for proper oral hygiene  Community Resource Referral / Chronic Care Management: CRR required this visit?  No   CCM required this visit?  No      Plan:     I have personally reviewed and noted the following in the patient's chart:   Medical and social history Use of alcohol, tobacco or illicit drugs  Current medications and supplements including opioid prescriptions. Patient is currently taking opioid prescriptions. Information provided to patient regarding non-opioid alternatives. Patient advised to discuss non-opioid treatment plan with their provider. Functional ability and status Nutritional status Physical activity Advanced directives List of other physicians Hospitalizations, surgeries, and ER visits in previous 12 months Vitals Screenings to include cognitive, depression, and falls Referrals and appointments  In addition, I have reviewed and discussed with patient certain preventive protocols, quality metrics, and best practice recommendations. A written personalized care plan for preventive services as well as general preventive health recommendations were provided to patient.     LDionisio David LPN   [id number]  Nurse Notes: none

## 2022-11-25 NOTE — Patient Instructions (Signed)
Kimberly Cowan , Thank you for taking time to come for your Medicare Wellness Visit. I appreciate your ongoing commitment to your health goals. Please review the following plan we discussed and let me know if I can assist you in the future.   These are the goals we discussed:  Goals      DIET - EAT MORE FRUITS AND VEGETABLES     DIET - INCREASE WATER INTAKE     Recommend to drink at least 6-8 8oz glasses of water per day.     Exercise 150 min/wk Moderate Activity     Recommend to exercise for at least 150 minutes per week.     Weight (lb) < 200 lb (90.7 kg)     Pt states she would like to lose weight with healthy eating and physical activity         This is a list of the screening recommended for you and due dates:  Health Maintenance  Topic Date Due   Hepatitis C Screening: USPSTF Recommendation to screen - Ages 60-79 yo.  Never done   COVID-19 Vaccine (5 - 2023-24 season) 05/29/2022   Flu Shot  12/27/2022*   Mammogram  01/03/2023   Medicare Annual Wellness Visit  11/26/2023   DTaP/Tdap/Td vaccine (4 - Td or Tdap) 04/24/2024   Colon Cancer Screening  04/12/2028   HIV Screening  Completed   Zoster (Shingles) Vaccine  Completed   Pneumococcal Vaccination  Aged Out   HPV Vaccine  Aged Out  *Topic was postponed. The date shown is not the original due date.    Advanced directives: no  Conditions/risks identified: none  Next appointment: Follow up in one year for your annual wellness visit. 12/01/23 @ 11:45 am by phone  Preventive Care 40-64 Years, Female Preventive care refers to lifestyle choices and visits with your health care provider that can promote health and wellness. What does preventive care include? A yearly physical exam. This is also called an annual well check. Dental exams once or twice a year. Routine eye exams. Ask your health care provider how often you should have your eyes checked. Personal lifestyle choices, including: Daily care of your teeth and  gums. Regular physical activity. Eating a healthy diet. Avoiding tobacco and drug use. Limiting alcohol use. Practicing safe sex. Taking low-dose aspirin daily starting at age 33. Taking vitamin and mineral supplements as recommended by your health care provider. What happens during an annual well check? The services and screenings done by your health care provider during your annual well check will depend on your age, overall health, lifestyle risk factors, and family history of disease. Counseling  Your health care provider may ask you questions about your: Alcohol use. Tobacco use. Drug use. Emotional well-being. Home and relationship well-being. Sexual activity. Eating habits. Work and work Statistician. Method of birth control. Menstrual cycle. Pregnancy history. Screening  You may have the following tests or measurements: Height, weight, and BMI. Blood pressure. Lipid and cholesterol levels. These may be checked every 5 years, or more frequently if you are over 45 years old. Skin check. Lung cancer screening. You may have this screening every year starting at age 25 if you have a 30-pack-year history of smoking and currently smoke or have quit within the past 15 years. Fecal occult blood test (FOBT) of the stool. You may have this test every year starting at age 92. Flexible sigmoidoscopy or colonoscopy. You may have a sigmoidoscopy every 5 years or a colonoscopy every 10  years starting at age 38. Hepatitis C blood test. Hepatitis B blood test. Sexually transmitted disease (STD) testing. Diabetes screening. This is done by checking your blood sugar (glucose) after you have not eaten for a while (fasting). You may have this done every 1-3 years. Mammogram. This may be done every 1-2 years. Talk to your health care provider about when you should start having regular mammograms. This may depend on whether you have a family history of breast cancer. BRCA-related cancer  screening. This may be done if you have a family history of breast, ovarian, tubal, or peritoneal cancers. Pelvic exam and Pap test. This may be done every 3 years starting at age 52. Starting at age 79, this may be done every 5 years if you have a Pap test in combination with an HPV test. Bone density scan. This is done to screen for osteoporosis. You may have this scan if you are at high risk for osteoporosis. Discuss your test results, treatment options, and if necessary, the need for more tests with your health care provider. Vaccines  Your health care provider may recommend certain vaccines, such as: Influenza vaccine. This is recommended every year. Tetanus, diphtheria, and acellular pertussis (Tdap, Td) vaccine. You may need a Td booster every 10 years. Zoster vaccine. You may need this after age 30. Pneumococcal 13-valent conjugate (PCV13) vaccine. You may need this if you have certain conditions and were not previously vaccinated. Pneumococcal polysaccharide (PPSV23) vaccine. You may need one or two doses if you smoke cigarettes or if you have certain conditions. Talk to your health care provider about which screenings and vaccines you need and how often you need them. This information is not intended to replace advice given to you by your health care provider. Make sure you discuss any questions you have with your health care provider. Document Released: 10/11/2015 Document Revised: 06/03/2016 Document Reviewed: 07/16/2015 Elsevier Interactive Patient Education  2017 [employer] Prevention in the Home Falls can cause injuries. They can happen to people of all ages. There are many things you can do to make your home safe and to help prevent falls. What can I do on the outside of my home? Regularly fix the edges of walkways and driveways and fix any cracks. Remove anything that might make you trip as you walk through a door, such as a raised step or threshold. Trim any  bushes or trees on the path to your home. Use bright outdoor lighting. Clear any walking paths of anything that might make someone trip, such as rocks or tools. Regularly check to see if handrails are loose or broken. Make sure that both sides of any steps have handrails. Any raised decks and porches should have guardrails on the edges. Have any leaves, snow, or ice cleared regularly. Use sand or salt on walking paths during winter. Clean up any spills in your garage right away. This includes oil or grease spills. What can I do in the bathroom? Use night lights. Install grab bars by the toilet and in the tub and shower. Do not use towel bars as grab bars. Use non-skid mats or decals in the tub or shower. If you need to sit down in the shower, use a plastic, non-slip stool. Keep the floor dry. Clean up any water that spills on the floor as soon as it happens. Remove soap buildup in the tub or shower regularly. Attach bath mats securely with double-sided non-slip rug tape. Do not have  throw rugs and other things on the floor that can make you trip. What can I do in the bedroom? Use night lights. Make sure that you have a light by your bed that is easy to reach. Do not use any sheets or blankets that are too big for your bed. They should not hang down onto the floor. Have a firm chair that has side arms. You can use this for support while you get dressed. Do not have throw rugs and other things on the floor that can make you trip. What can I do in the kitchen? Clean up any spills right away. Avoid walking on wet floors. Keep items that you use a lot in easy-to-reach places. If you need to reach something above you, use a strong step stool that has a grab bar. Keep electrical cords out of the way. Do not use floor polish or wax that makes floors slippery. If you must use wax, use non-skid floor wax. Do not have throw rugs and other things on the floor that can make you trip. What can I do  with my stairs? Do not leave any items on the stairs. Make sure that there are handrails on both sides of the stairs and use them. Fix handrails that are broken or loose. Make sure that handrails are as long as the stairways. Check any carpeting to make sure that it is firmly attached to the stairs. Fix any carpet that is loose or worn. Avoid having throw rugs at the top or bottom of the stairs. If you do have throw rugs, attach them to the floor with carpet tape. Make sure that you have a light switch at the top of the stairs and the bottom of the stairs. If you do not have them, ask someone to add them for you. What else can I do to help prevent falls? Wear shoes that: Do not have high heels. Have rubber bottoms. Are comfortable and fit you well. Are closed at the toe. Do not wear sandals. If you use a stepladder: Make sure that it is fully opened. Do not climb a closed stepladder. Make sure that both sides of the stepladder are locked into place. Ask someone to hold it for you, if possible. Clearly mark and make sure that you can see: Any grab bars or handrails. First and last steps. Where the edge of each step is. Use tools that help you move around (mobility aids) if they are needed. These include: Canes. Walkers. Scooters. Crutches. Turn on the lights when you go into a dark area. Replace any light bulbs as soon as they burn out. Set up your furniture so you have a clear path. Avoid moving your furniture around. If any of your floors are uneven, fix them. If there are any pets around you, be aware of where they are. Review your medicines with your doctor. Some medicines can make you feel dizzy. This can increase your chance of falling. Ask your doctor what other things that you can do to help prevent falls. This information is not intended to replace advice given to you by your health care provider. Make sure you discuss any questions you have with your health care  provider. Document Released: 07/11/2009 Document Revised: 02/20/2016 Document Reviewed: 10/19/2014 Elsevier Interactive Patient Education  2017 [employer].

## 2022-12-07 ENCOUNTER — Inpatient Hospital Stay: Payer: BC Managed Care – PPO

## 2022-12-07 ENCOUNTER — Emergency Department: Payer: BC Managed Care – PPO

## 2022-12-07 ENCOUNTER — Inpatient Hospital Stay
Admission: EM | Admit: 2022-12-07 | Discharge: 2022-12-10 | DRG: 871 | Disposition: A | Discharge status: Home / Self Care | Payer: BC Managed Care – PPO | Attending: Internal Medicine | Admitting: Internal Medicine

## 2022-12-07 ENCOUNTER — Encounter: Payer: Self-pay | Admitting: Internal Medicine

## 2022-12-07 ENCOUNTER — Other Ambulatory Visit: Payer: Self-pay

## 2022-12-07 DIAGNOSIS — Z9079 Acquired absence of other genital organ(s): Secondary | ICD-10-CM

## 2022-12-07 DIAGNOSIS — I10 Essential (primary) hypertension: Secondary | ICD-10-CM | POA: Diagnosis not present

## 2022-12-07 DIAGNOSIS — J302 Other seasonal allergic rhinitis: Secondary | ICD-10-CM | POA: Diagnosis not present

## 2022-12-07 DIAGNOSIS — Z981 Arthrodesis status: Secondary | ICD-10-CM

## 2022-12-07 DIAGNOSIS — J9601 Acute respiratory failure with hypoxia: Secondary | ICD-10-CM | POA: Diagnosis not present

## 2022-12-07 DIAGNOSIS — M1990 Unspecified osteoarthritis, unspecified site: Secondary | ICD-10-CM | POA: Diagnosis not present

## 2022-12-07 DIAGNOSIS — F419 Anxiety disorder, unspecified: Secondary | ICD-10-CM | POA: Diagnosis present

## 2022-12-07 DIAGNOSIS — J189 Pneumonia, unspecified organism: Secondary | ICD-10-CM | POA: Diagnosis present

## 2022-12-07 DIAGNOSIS — R000 Tachycardia, unspecified: Secondary | ICD-10-CM | POA: Diagnosis not present

## 2022-12-07 DIAGNOSIS — G8929 Other chronic pain: Secondary | ICD-10-CM | POA: Diagnosis not present

## 2022-12-07 DIAGNOSIS — Z90710 Acquired absence of both cervix and uterus: Secondary | ICD-10-CM

## 2022-12-07 DIAGNOSIS — Z86711 Personal history of pulmonary embolism: Secondary | ICD-10-CM

## 2022-12-07 DIAGNOSIS — Z6833 Body mass index (BMI) 33.0-33.9, adult: Secondary | ICD-10-CM | POA: Diagnosis present

## 2022-12-07 DIAGNOSIS — A419 Sepsis, unspecified organism: Secondary | ICD-10-CM | POA: Diagnosis not present

## 2022-12-07 DIAGNOSIS — R0602 Shortness of breath: Secondary | ICD-10-CM | POA: Diagnosis not present

## 2022-12-07 DIAGNOSIS — R42 Dizziness and giddiness: Secondary | ICD-10-CM | POA: Diagnosis not present

## 2022-12-07 DIAGNOSIS — Z8261 Family history of arthritis: Secondary | ICD-10-CM

## 2022-12-07 DIAGNOSIS — Z8249 Family history of ischemic heart disease and other diseases of the circulatory system: Secondary | ICD-10-CM

## 2022-12-07 DIAGNOSIS — E876 Hypokalemia: Secondary | ICD-10-CM | POA: Diagnosis present

## 2022-12-07 DIAGNOSIS — F325 Major depressive disorder, single episode, in full remission: Secondary | ICD-10-CM | POA: Diagnosis present

## 2022-12-07 DIAGNOSIS — Z6832 Body mass index (BMI) 32.0-32.9, adult: Secondary | ICD-10-CM

## 2022-12-07 DIAGNOSIS — Z881 Allergy status to other antibiotic agents status: Secondary | ICD-10-CM

## 2022-12-07 DIAGNOSIS — Z9049 Acquired absence of other specified parts of digestive tract: Secondary | ICD-10-CM | POA: Diagnosis not present

## 2022-12-07 DIAGNOSIS — F418 Other specified anxiety disorders: Secondary | ICD-10-CM | POA: Diagnosis not present

## 2022-12-07 DIAGNOSIS — R197 Diarrhea, unspecified: Secondary | ICD-10-CM | POA: Diagnosis present

## 2022-12-07 DIAGNOSIS — G4730 Sleep apnea, unspecified: Secondary | ICD-10-CM | POA: Diagnosis not present

## 2022-12-07 DIAGNOSIS — K219 Gastro-esophageal reflux disease without esophagitis: Secondary | ICD-10-CM | POA: Diagnosis not present

## 2022-12-07 DIAGNOSIS — M79661 Pain in right lower leg: Secondary | ICD-10-CM | POA: Diagnosis not present

## 2022-12-07 DIAGNOSIS — Z86718 Personal history of other venous thrombosis and embolism: Secondary | ICD-10-CM

## 2022-12-07 DIAGNOSIS — R0689 Other abnormalities of breathing: Secondary | ICD-10-CM | POA: Diagnosis not present

## 2022-12-07 DIAGNOSIS — Z79899 Other long term (current) drug therapy: Secondary | ICD-10-CM

## 2022-12-07 DIAGNOSIS — M5416 Radiculopathy, lumbar region: Secondary | ICD-10-CM | POA: Diagnosis not present

## 2022-12-07 DIAGNOSIS — M79662 Pain in left lower leg: Secondary | ICD-10-CM | POA: Diagnosis not present

## 2022-12-07 DIAGNOSIS — Z885 Allergy status to narcotic agent status: Secondary | ICD-10-CM

## 2022-12-07 DIAGNOSIS — R112 Nausea with vomiting, unspecified: Secondary | ICD-10-CM | POA: Diagnosis present

## 2022-12-07 DIAGNOSIS — Z1152 Encounter for screening for COVID-19: Secondary | ICD-10-CM | POA: Diagnosis not present

## 2022-12-07 DIAGNOSIS — R070 Pain in throat: Secondary | ICD-10-CM | POA: Diagnosis not present

## 2022-12-07 DIAGNOSIS — R0902 Hypoxemia: Secondary | ICD-10-CM | POA: Diagnosis not present

## 2022-12-07 DIAGNOSIS — H409 Unspecified glaucoma: Secondary | ICD-10-CM | POA: Diagnosis present

## 2022-12-07 DIAGNOSIS — R059 Cough, unspecified: Secondary | ICD-10-CM | POA: Diagnosis not present

## 2022-12-07 DIAGNOSIS — G4733 Obstructive sleep apnea (adult) (pediatric): Secondary | ICD-10-CM

## 2022-12-07 DIAGNOSIS — Z9682 Presence of neurostimulator: Secondary | ICD-10-CM

## 2022-12-07 DIAGNOSIS — E669 Obesity, unspecified: Secondary | ICD-10-CM | POA: Diagnosis present

## 2022-12-07 DIAGNOSIS — Z9884 Bariatric surgery status: Secondary | ICD-10-CM

## 2022-12-07 DIAGNOSIS — R6520 Severe sepsis without septic shock: Secondary | ICD-10-CM | POA: Diagnosis present

## 2022-12-07 DIAGNOSIS — Z90722 Acquired absence of ovaries, bilateral: Secondary | ICD-10-CM

## 2022-12-07 DIAGNOSIS — Z91040 Latex allergy status: Secondary | ICD-10-CM

## 2022-12-07 DIAGNOSIS — Z880 Allergy status to penicillin: Secondary | ICD-10-CM

## 2022-12-07 HISTORY — DX: Pneumonia, unspecified organism: J18.9

## 2022-12-07 HISTORY — DX: Sepsis, unspecified organism: R65.20

## 2022-12-07 LAB — CBC WITH DIFFERENTIAL/PLATELET
Abs Immature Granulocytes: 0.01 10*3/uL (ref 0.00–0.07)
Basophils Absolute: 0 10*3/uL (ref 0.0–0.1)
Basophils Relative: 0 %
Eosinophils Absolute: 0 10*3/uL (ref 0.0–0.5)
Eosinophils Relative: 0 %
HCT: 40.9 % (ref 36.0–46.0)
Hemoglobin: 12.5 g/dL (ref 12.0–15.0)
Immature Granulocytes: 0 %
Lymphocytes Relative: 39 %
Lymphs Abs: 1.2 10*3/uL (ref 0.7–4.0)
MCH: 29.1 pg (ref 26.0–34.0)
MCHC: 30.6 g/dL (ref 30.0–36.0)
MCV: 95.3 fL (ref 80.0–100.0)
Monocytes Absolute: 0.2 10*3/uL (ref 0.1–1.0)
Monocytes Relative: 6 %
Neutro Abs: 1.7 10*3/uL (ref 1.7–7.7)
Neutrophils Relative %: 55 %
Platelets: 165 10*3/uL (ref 150–400)
RBC: 4.29 MIL/uL (ref 3.87–5.11)
RDW: 14.2 % (ref 11.5–15.5)
WBC: 3 10*3/uL — ABNORMAL LOW (ref 4.0–10.5)
nRBC: 0 % (ref 0.0–0.2)

## 2022-12-07 LAB — LIPASE, BLOOD: Lipase: 25 U/L (ref 11–51)

## 2022-12-07 LAB — LACTIC ACID, PLASMA
Lactic Acid, Venous: 3.3 mmol/L (ref 0.5–1.9)
Lactic Acid, Venous: 4.6 mmol/L (ref 0.5–1.9)

## 2022-12-07 LAB — RESP PANEL BY RT-PCR (RSV, FLU A&B, COVID)  RVPGX2
Influenza A by PCR: NEGATIVE
Influenza B by PCR: NEGATIVE
Resp Syncytial Virus by PCR: NEGATIVE
SARS Coronavirus 2 by RT PCR: NEGATIVE

## 2022-12-07 LAB — RENAL FUNCTION PANEL
Albumin: 2.9 g/dL — ABNORMAL LOW (ref 3.5–5.0)
Anion gap: 5 (ref 5–15)
BUN: 12 mg/dL (ref 6–20)
CO2: 22 mmol/L (ref 22–32)
Calcium: 8.5 mg/dL — ABNORMAL LOW (ref 8.9–10.3)
Chloride: 113 mmol/L — ABNORMAL HIGH (ref 98–111)
Creatinine, Ser: 0.82 mg/dL (ref 0.44–1.00)
GFR, Estimated: >60 mL/min (ref >60)
Glucose, Bld: 162 mg/dL — ABNORMAL HIGH (ref 70–99)
Phosphorus: 1.4 mg/dL — ABNORMAL LOW (ref 2.5–4.6)
Potassium: 3.6 mmol/L (ref 3.5–5.1)
Sodium: 140 mmol/L (ref 135–145)

## 2022-12-07 LAB — PROTIME-INR
INR: 1 (ref 0.8–1.2)
Prothrombin Time: 13.1 seconds (ref 11.4–15.2)

## 2022-12-07 LAB — APTT: aPTT: 26 seconds (ref 24–36)

## 2022-12-07 LAB — COMPREHENSIVE METABOLIC PANEL
ALT: 15 U/L (ref 0–44)
AST: 15 U/L (ref 15–41)
Albumin: 2.7 g/dL — ABNORMAL LOW (ref 3.5–5.0)
Alkaline Phosphatase: 61 U/L (ref 38–126)
Anion gap: 6 (ref 5–15)
BUN: 10 mg/dL (ref 6–20)
CO2: 21 mmol/L — ABNORMAL LOW (ref 22–32)
Calcium: 7.7 mg/dL — ABNORMAL LOW (ref 8.9–10.3)
Chloride: 111 mmol/L (ref 98–111)
Creatinine, Ser: 0.61 mg/dL (ref 0.44–1.00)
GFR, Estimated: >60 mL/min (ref >60)
Glucose, Bld: 132 mg/dL — ABNORMAL HIGH (ref 70–99)
Potassium: 2.4 mmol/L — CL (ref 3.5–5.1)
Sodium: 138 mmol/L (ref 135–145)
Total Bilirubin: 1.4 mg/dL — ABNORMAL HIGH (ref 0.3–1.2)
Total Protein: 5.2 g/dL — ABNORMAL LOW (ref 6.5–8.1)

## 2022-12-07 LAB — STREP PNEUMONIAE URINARY ANTIGEN: Strep Pneumo Urinary Antigen: NEGATIVE

## 2022-12-07 LAB — PROCALCITONIN: Procalcitonin: 1.67 ng/mL

## 2022-12-07 LAB — MAGNESIUM: Magnesium: 2 mg/dL (ref 1.7–2.4)

## 2022-12-07 LAB — PHOSPHORUS: Phosphorus: 1.8 mg/dL — ABNORMAL LOW (ref 2.5–4.6)

## 2022-12-07 LAB — PREGNANCY, URINE: Preg Test, Ur: NEGATIVE

## 2022-12-07 MED ORDER — PANTOPRAZOLE SODIUM 40 MG PO TBEC
40.0000 mg | DELAYED_RELEASE_TABLET | Freq: Two times a day (BID) | ORAL | Status: DC
  Administered 2022-12-07 – 2022-12-10 (×6): 40 mg via ORAL
  Filled 2022-12-07 (×6): qty 1

## 2022-12-07 MED ORDER — ALBUTEROL SULFATE (2.5 MG/3ML) 0.083% IN NEBU
3.0000 mL | INHALATION_SOLUTION | RESPIRATORY_TRACT | Status: DC | PRN
  Administered 2022-12-07 – 2022-12-08 (×2): 3 mL via RESPIRATORY_TRACT
  Filled 2022-12-07 (×2): qty 3

## 2022-12-07 MED ORDER — ONDANSETRON HCL 4 MG/2ML IJ SOLN
4.0000 mg | Freq: Once | INTRAMUSCULAR | Status: AC
  Administered 2022-12-07: 4 mg via INTRAVENOUS
  Filled 2022-12-07: qty 2

## 2022-12-07 MED ORDER — SODIUM CHLORIDE 0.9 % IV SOLN: INTRAVENOUS | Status: DC

## 2022-12-07 MED ORDER — POTASSIUM CHLORIDE CRYS ER 20 MEQ PO TBCR
40.0000 meq | EXTENDED_RELEASE_TABLET | ORAL | Status: AC
  Administered 2022-12-07 (×2): 40 meq via ORAL
  Filled 2022-12-07: qty 2

## 2022-12-07 MED ORDER — SODIUM CHLORIDE 0.9 % IV BOLUS
1000.0000 mL | Freq: Once | INTRAVENOUS | Status: AC
  Administered 2022-12-07: 1000 mL via INTRAVENOUS

## 2022-12-07 MED ORDER — ONDANSETRON HCL 4 MG/2ML IJ SOLN
4.0000 mg | Freq: Three times a day (TID) | INTRAMUSCULAR | Status: DC | PRN
  Administered 2022-12-08: 4 mg via INTRAVENOUS
  Filled 2022-12-07: qty 2

## 2022-12-07 MED ORDER — SUMATRIPTAN SUCCINATE 50 MG PO TABS
100.0000 mg | ORAL_TABLET | Freq: Once | ORAL | Status: AC | PRN
  Administered 2022-12-08: 100 mg via ORAL
  Filled 2022-12-07: qty 2

## 2022-12-07 MED ORDER — SERTRALINE HCL 50 MG PO TABS
100.0000 mg | ORAL_TABLET | Freq: Every day | ORAL | Status: DC
  Administered 2022-12-07 – 2022-12-10 (×4): 100 mg via ORAL
  Filled 2022-12-07 (×4): qty 2

## 2022-12-07 MED ORDER — ENOXAPARIN SODIUM 60 MG/0.6ML IJ SOSY
0.5000 mg/kg | PREFILLED_SYRINGE | INTRAMUSCULAR | Status: DC
  Administered 2022-12-07 – 2022-12-09 (×3): 47.5 mg via SUBCUTANEOUS
  Filled 2022-12-07 (×3): qty 0.6

## 2022-12-07 MED ORDER — KETOROLAC TROMETHAMINE 15 MG/ML IJ SOLN
15.0000 mg | Freq: Once | INTRAMUSCULAR | Status: AC
  Administered 2022-12-07: 15 mg via INTRAVENOUS
  Filled 2022-12-07: qty 1

## 2022-12-07 MED ORDER — HYDRALAZINE HCL 20 MG/ML IJ SOLN: 5.0000 mg | INTRAMUSCULAR | Status: DC | PRN

## 2022-12-07 MED ORDER — LEVOFLOXACIN IN D5W 750 MG/150ML IV SOLN
750.0000 mg | INTRAVENOUS | Status: DC
  Administered 2022-12-07 – 2022-12-08 (×2): 750 mg via INTRAVENOUS
  Filled 2022-12-07 (×2): qty 150

## 2022-12-07 MED ORDER — PANTOPRAZOLE SODIUM 40 MG IV SOLR
40.0000 mg | Freq: Once | INTRAVENOUS | Status: AC
  Administered 2022-12-07: 40 mg via INTRAVENOUS
  Filled 2022-12-07: qty 10

## 2022-12-07 MED ORDER — SODIUM CHLORIDE 0.9 % IV BOLUS: 500.0000 mL | Freq: Once | INTRAVENOUS | Status: DC

## 2022-12-07 MED ORDER — ACETAMINOPHEN 325 MG PO TABS
650.0000 mg | ORAL_TABLET | Freq: Four times a day (QID) | ORAL | Status: DC | PRN
  Administered 2022-12-07 – 2022-12-08 (×2): 650 mg via ORAL
  Filled 2022-12-07 (×2): qty 2

## 2022-12-07 MED ORDER — IPRATROPIUM-ALBUTEROL 0.5-2.5 (3) MG/3ML IN SOLN
6.0000 mL | Freq: Once | RESPIRATORY_TRACT | Status: AC
  Administered 2022-12-07: 6 mL via RESPIRATORY_TRACT
  Filled 2022-12-07: qty 3

## 2022-12-07 MED ORDER — POTASSIUM PHOSPHATES 15 MMOLE/5ML IV SOLN
30.0000 mmol | Freq: Once | INTRAVENOUS | Status: AC
  Administered 2022-12-07: 30 mmol via INTRAVENOUS
  Filled 2022-12-07: qty 10

## 2022-12-07 MED ORDER — CYCLOBENZAPRINE HCL 10 MG PO TABS
10.0000 mg | ORAL_TABLET | Freq: Every day | ORAL | Status: DC | PRN
  Administered 2022-12-07 – 2022-12-09 (×2): 10 mg via ORAL
  Filled 2022-12-07 (×2): qty 1

## 2022-12-07 MED ORDER — POTASSIUM CHLORIDE CRYS ER 20 MEQ PO TBCR
40.0000 meq | EXTENDED_RELEASE_TABLET | Freq: Once | ORAL | Status: DC
  Filled 2022-12-07: qty 2

## 2022-12-07 MED ORDER — DIPHENOXYLATE-ATROPINE 2.5-0.025 MG PO TABS
2.0000 | ORAL_TABLET | Freq: Four times a day (QID) | ORAL | Status: DC
  Administered 2022-12-07 – 2022-12-08 (×4): 2 via ORAL
  Filled 2022-12-07 (×4): qty 2

## 2022-12-07 MED ORDER — IOHEXOL 350 MG/ML SOLN
75.0000 mL | Freq: Once | INTRAVENOUS | Status: AC | PRN
  Administered 2022-12-07: 75 mL via INTRAVENOUS

## 2022-12-07 MED ORDER — FENTANYL CITRATE PF 50 MCG/ML IJ SOSY: 25.0000 ug | PREFILLED_SYRINGE | INTRAMUSCULAR | Status: DC | PRN

## 2022-12-07 MED ORDER — LEVOFLOXACIN IN D5W 750 MG/150ML IV SOLN
750.0000 mg | Freq: Once | INTRAVENOUS | Status: DC
  Filled 2022-12-07: qty 150

## 2022-12-07 MED ORDER — TEMAZEPAM 15 MG PO CAPS: 15.0000 mg | ORAL_CAPSULE | Freq: Every evening | ORAL | Status: DC | PRN

## 2022-12-07 MED ORDER — POTASSIUM CHLORIDE 10 MEQ/100ML IV SOLN
10.0000 meq | INTRAVENOUS | Status: AC
  Administered 2022-12-07 (×2): 10 meq via INTRAVENOUS
  Filled 2022-12-07 (×2): qty 100

## 2022-12-07 MED ORDER — OXYCODONE-ACETAMINOPHEN 5-325 MG PO TABS
1.0000 | ORAL_TABLET | ORAL | Status: DC | PRN
  Administered 2022-12-07 – 2022-12-10 (×8): 1 via ORAL
  Filled 2022-12-07 (×8): qty 1

## 2022-12-07 MED ORDER — GABAPENTIN 300 MG PO CAPS
600.0000 mg | ORAL_CAPSULE | Freq: Every day | ORAL | Status: DC
  Administered 2022-12-07 – 2022-12-09 (×3): 600 mg via ORAL
  Filled 2022-12-07 (×3): qty 2

## 2022-12-07 MED ORDER — DM-GUAIFENESIN ER 30-600 MG PO TB12
1.0000 | ORAL_TABLET | Freq: Two times a day (BID) | ORAL | Status: DC | PRN
  Administered 2022-12-07 – 2022-12-09 (×2): 1 via ORAL
  Filled 2022-12-07 (×3): qty 1

## 2022-12-07 MED ORDER — LATANOPROST 0.005 % OP SOLN: 1.0000 [drp] | Freq: Every day | OPHTHALMIC | Status: DC

## 2022-12-07 MED ORDER — SODIUM CHLORIDE 0.9 % IV BOLUS
500.0000 mL | Freq: Once | INTRAVENOUS | Status: AC
  Administered 2022-12-07: 500 mL via INTRAVENOUS

## 2022-12-07 NOTE — ED Notes (Signed)
Patient transported to CT 

## 2022-12-07 NOTE — Consult Note (Deleted)
[facility] for Heparin Infusion Indication: chest pain/ACS  Allergies  Allergen Reactions   Morphine And Related Hives, Shortness Of Breath, Nausea And Vomiting and Other (See Comments)    "can't breathe"   Penicillins Anaphylaxis, Hives, Shortness Of Breath and Nausea And Vomiting    "can't breathe", Has patient had a PCN reaction causing immediate rash, facial/tongue/throat swelling, SOB or lightheadedness with hypotension: Yes Has patient had a PCN reaction causing severe rash involving mucus membranes or skin necrosis: No Has patient had a PCN reaction that required hospitalization No Has patient had a PCN reaction occurring within the last 10 years: No If all of the above answers are "NO", then may proceed with Cephalosporin use.    Latex Hives   Baclofen Nausea Only    Jittery, anxious    Patient Measurements: Height: '5\' 7"'$  (170.2 cm) Weight: 93 kg (205 lb) IBW/kg (Calculated) : 61.6 Heparin Dosing Weight: 76.8 kg  Vital Signs: Temp: 98.3 F (36.8 C) (03/11 1330) Temp Source: Oral (03/11 0742) BP: 126/78 (03/11 1300) Pulse Rate: 89 (03/11 1300)  Labs: Recent Labs    12/07/22 0749 12/07/22 0830  HGB 12.5  --   HCT 40.9  --   PLT 165  --   APTT 26  --   LABPROT 13.1  --   INR 1.0  --   CREATININE  --  0.61    Estimated Creatinine Clearance: 94.2 mL/min (by C-G formula based on SCr of 0.61 mg/dL).   Medical History: Past Medical History:  Diagnosis Date   Allergy 1999   SEASONAL   Anemia    Anxiety    Arm pain, left 05/17/2018   Arthritis    lower spine   Bilateral lumbar radiculopathy    Cellulitis of abdominal wall 03/03/2021   Chronic back pain    Chronic GERD 03/03/2021   Clinical depression 04/04/2012   Depression    DVT (deep venous thrombosis) ([place])    Encounter for screening colonoscopy    Essential (primary) hypertension 06/04/2020   Glaucoma    RIGHT EYE   Headache(784.0)    migraines   Major  depressive disorder with single episode, in full remission ([place]) 08/15/2018   Malfunction of jejunostomy tube ([place]) 02/13/2021   Ovarian cyst    Pneumonia    walking pneumonia   Pulmonary embolism ([place]) 05/2017   Provoked, following knee surgery   S/P laparoscopic cholecystectomy 02/07/2021   Sleep apnea    no cpcap     last sleep study > 4 yrs    Medications:  No history of chronic anticoagulant use PTA  Assessment: 57 y.o. female with medical history significant of left leg DVT and PE not on anticoagulants currently, hypertension, GERD, depression with anxiety, obesity, s/p for gastric bypass surgery, who presents with shortness breath and chest pain. Pharmacy has been consulted to initiate and titrate heparin infusion. Baseline labs have been ordered and are pending.  Goal of Therapy:  Heparin level 0.3-0.7 units/ml Monitor platelets by anticoagulation protocol: Yes   Plan:  Give 4000 units bolus x 1 Start heparin infusion at 1100 units/hr Check anti-Xa level in 6 hours and daily while on heparin Continue to monitor H&H and platelets  Tuesday Terlecki A Brendia Dampier 12/07/2022,4:08 PM

## 2022-12-07 NOTE — Progress Notes (Signed)
PHARMACY -  BRIEF ANTIBIOTIC NOTE   Pharmacy has received consult(s) for levofloxacin from an ED provider.  The patient's profile has been reviewed for ht/wt/allergies/indication/available labs.    One time order(s) placed for levofloxacin IV 750 mg x 1   Further antibiotics/pharmacy consults should be ordered by admitting physician if indicated.                       Thank you, Wynelle Cleveland 12/07/2022  9:33 AM

## 2022-12-07 NOTE — H&P (Addendum)
History and Physical    Kimberly Cowan [id number] DOB: Aug 10, 1966 DOA: 12/07/2022  Referring MD/NP/PA:   PCP: Glean Hess, MD   Patient coming from:  The patient is coming from home.    Chief Complaint: Shortness of breath, chest pain  HPI: Kimberly Cowan is a 57 y.o. female with medical history significant of left leg DVT and PE not on anticoagulants currently, hypertension, GERD, depression with anxiety, obesity, s/p for gastric bypass surgery, who presents with shortness breath and chest pain.  Patient states that she started having cough, shortness breath, chest pain 4 days ago.  She coughs up little mucus.  The shortness breath has been progressively worsening.  The chest pain is located in the left side of chest, constant, sharp, 6 out of 10 in severity, nonradiating, pleuritic, aggravated by deep breath.  She had chills and fever of 100 at home, but her temperature is 98.3 in ED.  She also reports nausea, vomiting diarrhea.  She has 5-6 times of watery diarrhea today, multiple nonbilious nonbloody vomiting.  Patient has mild central abdominal discomfort.  No symptoms of UTI.  Patient states that she had history of left leg DVT, and still has intermittent left leg edema.  Patient had oxygen desaturation to 85% per EMS report.  Patient received 125 mg of Solu-Medrol, 2 g of magnesium sulfate by EMS.  Data reviewed independently and ED Course: pt was found to have WBC 3.0, negative PCR for COVID, flu and RSV, GFR> 60, potassium 2.4, temperature normal, blood pressure soft, heart rate 120, RR 28, oxygen saturation 92% on room air currently.  Chest x-ray showed left middle lobe and bilateral basilar infiltration.  Patient is admitted to PCU as inpatient.   EKG: I have personally reviewed.  Sinus rhythm, QTc 385, low voltage, heart rate 116.  Review of Systems:   General: has fevers, chills, no body weight gain, has poor appetite, has fatigue HEENT: no  blurry vision, hearing changes or sore throat Respiratory: has dyspnea, coughing, no wheezing CV: has chest pain, no palpitations GI: has nausea, vomiting, abdominal pain, diarrhea, no constipation GU: no dysuria, burning on urination, increased urinary frequency, hematuria  Ext: no leg edema Neuro: no unilateral weakness, numbness, or tingling, no vision change or hearing loss Skin: no rash, no skin tear. MSK: No muscle spasm, no deformity, no limitation of range of movement in spin Heme: No easy bruising.  Travel history: No recent long distant travel.   Allergy:  Allergies  Allergen Reactions   Morphine And Related Hives, Shortness Of Breath, Nausea And Vomiting and Other (See Comments)    "can't breathe"   Penicillins Anaphylaxis, Hives, Shortness Of Breath and Nausea And Vomiting    "can't breathe", Has patient had a PCN reaction causing immediate rash, facial/tongue/throat swelling, SOB or lightheadedness with hypotension: Yes Has patient had a PCN reaction causing severe rash involving mucus membranes or skin necrosis: No Has patient had a PCN reaction that required hospitalization No Has patient had a PCN reaction occurring within the last 10 years: No If all of the above answers are "NO", then may proceed with Cephalosporin use.    Latex Hives   Baclofen Nausea Only    Jittery, anxious    Past Medical History:  Diagnosis Date   Allergy 1999   SEASONAL   Anemia    Anxiety    Arm pain, left 05/17/2018   Arthritis    lower spine   Bilateral lumbar radiculopathy  Cellulitis of abdominal wall 03/03/2021   Chronic back pain    Chronic GERD 03/03/2021   Clinical depression 04/04/2012   Depression    DVT (deep venous thrombosis) ([place])    Encounter for screening colonoscopy    Essential (primary) hypertension 06/04/2020   Glaucoma    RIGHT EYE   Headache(784.0)    migraines   Major depressive disorder with single episode, in full remission ([place]) 08/15/2018    Malfunction of jejunostomy tube ([place]) 02/13/2021   Ovarian cyst    Pneumonia    walking pneumonia   Pulmonary embolism ([place]) 05/2017   Provoked, following knee surgery   S/P laparoscopic cholecystectomy 02/07/2021   Sleep apnea    no cpcap     last sleep study > 4 yrs    Past Surgical History:  Procedure Laterality Date   ABDOMINAL HYSTERECTOMY  10/2006   BACK SURGERY  2014   lumbar fusion   BACK SURGERY  2009   discectomy   BARIATRIC SURGERY  07/2020   Revision   [place]  01/2021   COLONOSCOPY WITH PROPOFOL N/A 04/12/2018   Procedure: COLONOSCOPY WITH PROPOFOL;  Surgeon: Lin Landsman, MD;  Location: [facility];  Service: Gastroenterology;  Laterality: N/A;   GASTRIC BYPASS  2005   KNEE ARTHROSCOPY WITH LATERAL MENISECTOMY Left 05/27/2017   Procedure: KNEE ARTHROSCOPY WITH LATERAL MENISECTOMY;  Surgeon: Hessie Knows, MD;  Location: [facility];  Service: Orthopedics;  Laterality: Left;   KNEE ARTHROSCOPY WITH LATERAL RELEASE Left 05/27/2017   Procedure: KNEE ARTHROSCOPY WITH LATERAL RELEASE;  Surgeon: Hessie Knows, MD;  Location: [facility];  Service: Orthopedics;  Laterality: Left;   LAPAROSCOPIC SALPINGO OOPHERECTOMY Bilateral 10/05/2016   Procedure: LAPAROSCOPIC SALPINGO OOPHORECTOMY;  Surgeon: Brayton Mars, MD;  Location: [facility];  Service: Gynecology;  Laterality: Bilateral;   REDUCTION MAMMAPLASTY Bilateral    2001   SPINAL CORD STIMULATOR INSERTION N/A 03/20/2016   Procedure: LUMBAR SPINAL CORD STIMULATOR INSERTION;  Surgeon: Eustace Moore, MD;  Location: [facility];  Service: Neurosurgery;  Laterality: N/A;   SPINE SURGERY  2009 2014 2017   TONSILLECTOMY      Social History:  reports that she has never smoked. She has never used smokeless tobacco. She reports that she does not drink alcohol and does not use drugs.  Family History:  Family History  Problem Relation Age of Onset   Diabetes Mother     Hypertension Mother    Obesity Mother    Arthritis Mother    Varicose Veins Mother    Diabetes Sister    Hypertension Sister    Arthritis Sister    Hypertension Father    Arthritis Father    Cancer Father    Obesity Father    Obesity Daughter    Obesity Sister    Arthritis Sister    Obesity Sister    Diabetes Maternal Grandmother    Cancer Maternal Aunt    Diabetes Maternal Aunt    Breast cancer Neg Hx      Prior to Admission medications   Medication Sig Start Date End Date Taking? Authorizing Provider  cyanocobalamin (VITAMIN B12) 1000 MCG/ML injection ADMINISTER 1 ML(1000 MCG) IN THE MUSCLE EVERY 30 DAYS 09/07/22   Glean Hess, MD  cyclobenzaprine (FLEXERIL) 10 MG tablet Take 10 mg by mouth daily as needed for muscle spasms.    [provider]  diphenoxylate-atropine (LOMOTIL) 2.5-0.025 MG tablet Take 2 tablets by mouth 4 (  four) times daily. 05/19/22   [provider]  fluticasone (FLONASE) 50 MCG/ACT nasal spray Place 1 spray into both nostrils daily as needed for allergies. 04/09/22   Glean Hess, MD  furosemide (LASIX) 20 MG tablet TAKE 1 TABLET(20 MG) BY MOUTH DAILY 09/02/22   Glean Hess, MD  gabapentin (NEURONTIN) 300 MG capsule Take 2 capsules (600 mg total) by mouth at bedtime. 10/01/22   Gillis Santa, MD  latanoprost (XALATAN) 0.005 % ophthalmic solution Place 1 drop into both eyes at bedtime.     [provider]  levocetirizine (XYZAL) 5 MG tablet Take 1 tablet (5 mg total) by mouth every evening. Patient not taking: Reported on 11/25/2022 10/02/21   Glean Hess, MD  Multiple Vitamins-Minerals (MULTIVITAMIN GUMMIES ADULTS PO) Take 1 tablet by mouth daily.    [provider]  pantoprazole (PROTONIX) 40 MG tablet Take 40 mg by mouth 2 (two) times daily.    [provider]  potassium chloride (MICRO-K) 10 MEQ CR capsule Take 10 mEq by mouth daily.    [provider]  rizatriptan (MAXALT-MLT) 10 MG  disintegrating tablet DISSOLVE 1 TABLET BY MOUTH AS NEEDED FOR MIGRAINE- MAY REPEAT IN 2 HOURS AS NEEDED 10/05/22   Glean Hess, MD  sertraline (ZOLOFT) 100 MG tablet Take 1 tablet (100 mg total) by mouth daily. 11/06/22   Glean Hess, MD  temazepam (RESTORIL) 15 MG capsule Take 1 capsule (15 mg total) by mouth at bedtime as needed for sleep. 10/21/22   Glean Hess, MD    Physical Exam: Vitals:   12/07/22 0742 12/07/22 0950 12/07/22 1030  BP: 126/83 114/64 103/63  Pulse: (!) 120 (!) 109 (!) 107  Resp: (!) 28 (!) 25 (!) 26  Temp: 98.3 F (36.8 C)    TempSrc: Oral    SpO2: 92% 95% 95%  Weight: 93 kg    Height: '5\' 7"'$  (1.702 m)     General: Not in acute distress HEENT:       Eyes: PERRL, EOMI, no scleral icterus.       ENT: No discharge from the ears and nose, no pharynx injection, no tonsillar enlargement.        Neck: No JVD, no bruit, no mass felt. Heme: No neck lymph node enlargement. Cardiac: S1/S2, RRR, No murmurs, No gallops or rubs. Respiratory: No rales, wheezing, rhonchi or rubs. GI: Soft, nondistended, nontender, no rebound pain, no organomegaly, BS present. GU: No hematuria Ext: 1+DP/PT pulse bilaterally. Has trace left leg edema Musculoskeletal: No joint deformities, No joint redness or warmth, no limitation of ROM in spin. Skin: No rashes.  Neuro: Alert, oriented X3, cranial nerves II-XII grossly intact, moves all extremities normally.  Psych: Patient is not psychotic, no suicidal or hemocidal ideation.  Labs on Admission: I have personally reviewed following labs and imaging studies  CBC: Recent Labs  Lab 12/07/22 0749  WBC 3.0*  NEUTROABS 1.7  HGB 12.5  HCT 40.9  MCV 95.3  PLT 123XX123   Basic Metabolic Panel: Recent Labs  Lab 12/07/22 0830  NA 138  K 2.4*  CL 111  CO2 21*  GLUCOSE 132*  BUN 10  CREATININE 0.61  CALCIUM 7.7*   GFR: Estimated Creatinine Clearance: 94.2 mL/min (by C-G formula based on SCr of 0.61 mg/dL). Liver Function  Tests: Recent Labs  Lab 12/07/22 0830  AST 15  ALT 15  ALKPHOS 61  BILITOT 1.4*  PROT 5.2*  ALBUMIN 2.7*   Recent Labs  Lab 12/07/22 0830  LIPASE 25   No results for input(s): "AMMONIA" in the last 168 hours. Coagulation Profile: Recent Labs  Lab 12/07/22 0749  INR 1.0   Cardiac Enzymes: No results for input(s): "CKTOTAL", "CKMB", "CKMBINDEX", "TROPONINI" in the last 168 hours. BNP (last 3 results) No results for input(s): "PROBNP" in the last 8760 hours. HbA1C: No results for input(s): "HGBA1C" in the last 72 hours. CBG: No results for input(s): "GLUCAP" in the last 168 hours. Lipid Profile: No results for input(s): "CHOL", "HDL", "LDLCALC", "TRIG", "CHOLHDL", "LDLDIRECT" in the last 72 hours. Thyroid Function Tests: No results for input(s): "TSH", "T4TOTAL", "FREET4", "T3FREE", "THYROIDAB" in the last 72 hours. Anemia Panel: No results for input(s): "VITAMINB12", "FOLATE", "FERRITIN", "TIBC", "IRON", "RETICCTPCT" in the last 72 hours. Urine analysis:    Component Value Date/Time   COLORURINE YELLOW (A) 03/03/2021 1706   APPEARANCEUR CLEAR (A) 03/03/2021 1706   APPEARANCEUR Hazy 10/31/2014 0731   LABSPEC 1.040 (H) 03/03/2021 1706   LABSPEC 1.013 10/31/2014 0731   PHURINE 5.0 03/03/2021 1706   GLUCOSEU NEGATIVE 03/03/2021 1706   GLUCOSEU Negative 10/31/2014 0731   HGBUR NEGATIVE 03/03/2021 1706   BILIRUBINUR NEGATIVE 03/03/2021 1706   BILIRUBINUR neg 01/16/2020 0856   BILIRUBINUR Negative 10/31/2014 0731   KETONESUR NEGATIVE 03/03/2021 1706   PROTEINUR NEGATIVE 03/03/2021 1706   UROBILINOGEN 0.2 01/16/2020 0856   NITRITE NEGATIVE 03/03/2021 1706   LEUKOCYTESUR TRACE (A) 03/03/2021 1706   LEUKOCYTESUR 1+ 10/31/2014 0731   Sepsis Labs: '@LABRCNTIP'$ (procalcitonin:4,lacticidven:4) ) Recent Results (from the past 240 hour(s))  Resp panel by RT-PCR (RSV, Flu A&B, Covid) Anterior Nasal Swab     Status: None   Collection Time: 12/07/22  7:49 AM   Specimen:  Anterior Nasal Swab  Result Value Ref Range Status   SARS Coronavirus 2 by RT PCR NEGATIVE NEGATIVE Final    Comment: (NOTE) SARS-CoV-2 target nucleic acids are NOT DETECTED.  The SARS-CoV-2 RNA is generally detectable in upper respiratory specimens during the acute phase of infection. The lowest concentration of SARS-CoV-2 viral copies this assay can detect is 138 copies/mL. A negative result does not preclude SARS-Cov-2 infection and should not be used as the sole basis for treatment or other patient management decisions. A negative result may occur with  improper specimen collection/handling, submission of specimen other than nasopharyngeal swab, presence of viral mutation(s) within the areas targeted by this assay, and inadequate number of viral copies(<138 copies/mL). A negative result must be combined with clinical observations, patient history, and epidemiological information. The expected result is Negative.  Fact Sheet for Patients:  EntrepreneurPulse.com.au  Fact Sheet for Healthcare Providers:  IncredibleEmployment.be  This test is no t yet approved or cleared by the [place] FDA and  has been authorized for detection and/or diagnosis of SARS-CoV-2 by FDA under an Emergency Use Authorization (EUA). This EUA will remain  in effect (meaning this test can be used) for the duration of the COVID-19 declaration under Section 564(b)(1) of the Act, 21 U.S.C.section 360bbb-3(b)(1), unless the authorization is terminated  or revoked sooner.       Influenza A by PCR NEGATIVE NEGATIVE Final   Influenza B by PCR NEGATIVE NEGATIVE Final    Comment: (NOTE) The Xpert Xpress SARS-CoV-2/FLU/RSV plus assay is intended as an aid in the diagnosis of influenza from Nasopharyngeal swab specimens and should not be used as a sole basis for treatment. Nasal washings and aspirates are unacceptable for Xpert Xpress SARS-CoV-2/FLU/RSV testing.  Fact  Sheet for Patients: EntrepreneurPulse.com.au  Fact Sheet for Healthcare Providers: IncredibleEmployment.be  This test is not yet approved or cleared by the [place] FDA and has been authorized for detection and/or diagnosis of SARS-CoV-2 by FDA under an Emergency Use Authorization (EUA). This EUA will remain in effect (meaning this test can be used) for the duration of the COVID-19 declaration under Section 564(b)(1) of the Act, 21 U.S.C. section 360bbb-3(b)(1), unless the authorization is terminated or revoked.     Resp Syncytial Virus by PCR NEGATIVE NEGATIVE Final    Comment: (NOTE) Fact Sheet for Patients: EntrepreneurPulse.com.au  Fact Sheet for Healthcare Providers: IncredibleEmployment.be  This test is not yet approved or cleared by the [place] FDA and has been authorized for detection and/or diagnosis of SARS-CoV-2 by FDA under an Emergency Use Authorization (EUA). This EUA will remain in effect (meaning this test can be used) for the duration of the COVID-19 declaration under Section 564(b)(1) of the Act, 21 U.S.C. section 360bbb-3(b)(1), unless the authorization is terminated or revoked.  Performed at [facility], [address] [place], [place] [address]      Radiological Exams on Admission: CT Angio Chest Pulmonary Embolism (PE) W or WO Contrast  Result Date: 12/07/2022 CLINICAL DATA:  Shortness of breath.  Cough. EXAM: CT ANGIOGRAPHY CHEST WITH CONTRAST TECHNIQUE: Multidetector CT imaging of the chest was performed using the standard protocol during bolus administration of intravenous contrast. Multiplanar CT image reconstructions and MIPs were obtained to evaluate the vascular anatomy. RADIATION DOSE REDUCTION: This exam was performed according to the departmental dose-optimization program which includes automated exposure control, adjustment of the mA and/or kV according  to patient size and/or use of iterative reconstruction technique. CONTRAST:  51m OMNIPAQUE IOHEXOL 350 MG/ML SOLN COMPARISON:  Plain film of earlier today.  CTA chest 03/03/2021 FINDINGS: Cardiovascular: The quality of this exam for evaluation of pulmonary embolism is poor. Limitations include suboptimal bolus timing, with contrast centered in the SVC, patient body habitus, and arm position, not raised above the head. There is also mild motion. No pulmonary embolism to the lobar level. Bovine arch.  Mild cardiomegaly, without pericardial effusion. Mediastinum/Nodes: No mediastinal or hilar adenopathy. The esophagus is dilated and fluid-filled including on 60/4. Lungs/Pleura: No pleural fluid. Diffuse airspace disease/consolidation, greater left than right with sparing of the right upper lobe. No extra alveolar air. Upper Abdomen: Degradation continuing into the upper abdomen. Status post Roux-en-Y gastric bypass. Normal imaged portions of the liver, spleen, pancreas, right adrenal gland, kidneys. Mild left adrenal thickening. Musculoskeletal: Dorsal spinal stimulator. Review of the MIP images confirms the above findings. IMPRESSION: 1. Poor quality evaluation for pulmonary embolism. No lobar embolism identified. 2. Left-greater-than-right airspace disease/consolidation, consistent with pneumonia or aspiration. 3. Esophageal air fluid level suggests dysmotility or gastroesophageal reflux. Electronically Signed   By: KAbigail MiyamotoM.D.   On: 12/07/2022 10:20   DG Chest Portable 1 View  Result Date: 12/07/2022 CLINICAL DATA:  Shortness of breath, cough EXAM: PORTABLE CHEST 1 VIEW COMPARISON:  03/03/2021 FINDINGS: Left mid lung and bibasilar airspace opacities compatible with pneumonia, worse in the left lung. Right hemidiaphragm is elevated as before. Negative for edema, large effusion or pneumothorax. Stable heart size. Thoracic stimulator noted. Trachea midline. IMPRESSION: Left mid lung and bibasilar airspace  opacities compatible with pneumonia. Electronically Signed   By: MJerilynn Mages  Shick M.D.   On: 12/07/2022 08:36      Assessment/Plan Principal Problem:   CAP (community acquired pneumonia) Active Problems:   Severe sepsis (HCC)   Nausea  vomiting and diarrhea   Hypokalemia   HTN (hypertension)   Depression with anxiety   OSA on CPAP   Obesity (BMI 30-39.9)   Assessment and Plan:  CAP (community acquired pneumonia) and severe sepsis: Initially patient had oxygen desaturation to 84% on room air per report.  Currently 92% on room air.  Chest x-ray showed left middle lobe and bilateral basilar infiltration, indicating possible pneumonia, however her temperature is normal currently, no leukocytosis (WBC 3.0), given history of PE, will need to rule out recurrent PE.  Patient meets criteria for severe sepsis with WBC 3.0, heart rate 120, RR 28, lactic acid 3.3.  -Admited to progressive unit as inpatient - IV Levaquin - Mucinex for cough  - Bronchodilators - Urine legionella and S. pneumococcal antigen - Follow up blood culture x2, sputum culture - IVF: 2.5L of NS bolus in ED, followed by 100 mL per hour of NS - check procalcitonin level and trend lactic acid level - f/u CTA to r/o PE - f/u lower extremity Doppler to rule out DVT due to asymmetric leg edema   Addendum: CTA negative for PE 1. Poor quality evaluation for pulmonary embolism. No lobar embolism identified. 2. Left-greater-than-right airspace disease/consolidation, consistent with pneumonia or aspiration. 3. Esophageal air fluid level suggests dysmotility orgastroesophageal reflux.  Nausea vomiting and diarrhea: -Follow-up C. difficile and GI pathogen panel -IV fluid as above  Hypertension: Blood pressure soft -Hold Lasix -IV hydralazine as needed  Hypokalemia: Potassium 2.4 -Repleted potassium -Check magnesium and phosphorus level  Depression with anxiety -Continue home medications  OSA - on CPAP  Obesity (BMI  30-39.9): S/p of gastric bypass.  Body weight 93 kg, BMI 32.11 -Exercise and healthy diet -Encourage losing weight      DVT ppx: sq Lovenox  Code Status: Full code  Family Communication:     Yes, patient's daughter at bed side.     Disposition Plan:  Anticipate discharge back to previous environment  Consults called:  none  Admission status and Level of care: Progressive:  as inpt       Dispo: The patient is from: Home              Anticipated d/c is to: Home              Anticipated d/c date is: 2 days              Patient currently is not medically stable to d/c.    Severity of Illness:  The appropriate patient status for this patient is INPATIENT. Inpatient status is judged to be reasonable and necessary in order to provide the required intensity of service to ensure the patient's safety. The patient's presenting symptoms, physical exam findings, and initial radiographic and laboratory data in the context of their chronic comorbidities is felt to place them at high risk for further clinical deterioration. Furthermore, it is not anticipated that the patient will be medically stable for discharge from the hospital within 2 midnights of admission.   * I certify that at the point of admission it is my clinical judgment that the patient will require inpatient hospital care spanning beyond 2 midnights from the point of admission due to high intensity of service, high risk for further deterioration and high frequency of surveillance required.*       Date of Service 12/07/2022    Ivor Costa [facility]   If 7PM-7AM, please contact night-coverage [email or web address] 12/07/2022, 11:28 AM

## 2022-12-07 NOTE — Progress Notes (Signed)
Pharmacy Antibiotic Note  Kimberly Cowan is a 57 y.o. female admitted on 12/07/2022 with pneumonia.  Pharmacy has been consulted for levofloxacin dosing. WBC 3.0, afebrile.  Pt reports nonproductive cough, N/V/D X 3 days and pain when taking a deep breath.  Plan: Levofloxacin 750 mg IV Q24H  Height: '5\' 7"'$  (170.2 cm) Weight: 93 kg (205 lb) IBW/kg (Calculated) : 61.6  Temp (24hrs), Avg:98.3 F (36.8 C), Min:98.3 F (36.8 C), Max:98.3 F (36.8 C)  Recent Labs  Lab 12/07/22 0749 12/07/22 0830  WBC 3.0*  --   CREATININE  --  0.61    Estimated Creatinine Clearance: 94.2 mL/min (by C-G formula based on SCr of 0.61 mg/dL).    Allergies  Allergen Reactions   Morphine And Related Hives, Shortness Of Breath, Nausea And Vomiting and Other (See Comments)    "can't breathe"   Penicillins Anaphylaxis, Hives, Shortness Of Breath and Nausea And Vomiting    "can't breathe", Has patient had a PCN reaction causing immediate rash, facial/tongue/throat swelling, SOB or lightheadedness with hypotension: Yes Has patient had a PCN reaction causing severe rash involving mucus membranes or skin necrosis: No Has patient had a PCN reaction that required hospitalization No Has patient had a PCN reaction occurring within the last 10 years: No If all of the above answers are "NO", then may proceed with Cephalosporin use.    Latex Hives   Baclofen Nausea Only    Jittery, anxious    Antimicrobials this admission: 3/11 Levofloxacin >>   Dose adjustments this admission: N/A  Microbiology results: 3/11 BCx: pending 3/11 Sputum: ordered   Thank you for allowing pharmacy to be a part of this patient's care.  Alison Murray 12/07/2022 9:47 AM

## 2022-12-07 NOTE — ED Triage Notes (Signed)
Pt arrived via EMS from home with complaint of SOB, cough, N/V/D. Pt states she has been having difficulty breathing since last Friday. Pt states she can not take a deep breath because it hurts and endorses rib pain. Per EMS pt RR was 40-50 upon arrival with o2 saturation 84-85% on room air. Pt received 247m of LR, '4mg'$  zofran, 2 albuterol and 1 duoneb treatment, '125mg'$  solu medrol, and 2g of mag in route by EMS with some improvement.

## 2022-12-07 NOTE — ED Provider Notes (Signed)
[facility] Provider Note    Event Date/Time   First MD Initiated Contact with Patient 12/07/22 [phone]     (approximate)   History   Chief Complaint: Shortness of Breath   HPI  Kimberly Cowan is a 57 y.o. female with a history of GERD, postoperative DVT and PE 2 years ago who comes the ED complaining of shortness of breath associated with nonproductive cough, nausea vomiting and diarrhea that have started 3 days ago and been gradually worsening.  Painful to take a deep breath.  EMS noted initial oxygen saturation of about 85%.  They gave 3 neb treatments, Solu-Medrol, magnesium sulfate during transport.  Patient reports only mild improvement of her symptoms.     Physical Exam   Triage Vital Signs: ED Triage Vitals [12/07/22 0742]  Enc Vitals Group     BP 126/83     Pulse Rate (!) 120     Resp (!) 28     Temp 98.3 F (36.8 C)     Temp Source Oral     SpO2 92 %     Weight 205 lb (93 kg)     Height '5\' 7"'$  (1.702 m)     Head Circumference      Peak Flow      Pain Score 6     Pain Loc      Pain Edu?      Excl. in [place]?     Most recent vital signs: Vitals:   12/07/22 0742  BP: 126/83  Pulse: (!) 120  Resp: (!) 28  Temp: 98.3 F (36.8 C)  SpO2: 92%    General: Awake, no distress.  CV:  Good peripheral perfusion.  Tachycardia heart rate 120 Resp:  Tachypnea, respiratory rate of 25.  Good air movement in all lung fields.  Coarse breath sounds.  Wheezing induced with cough. Abd:  No distention.  Generalized tenderness without peritoneal signs Other:  No lower extremity edema or calf tenderness.   ED Results / Procedures / Treatments   Labs (all labs ordered are listed, but only abnormal results are displayed) Labs Reviewed  CBC WITH DIFFERENTIAL/PLATELET - Abnormal; Notable for the following components:      Result Value   WBC 3.0 (*)    All other components within normal limits  COMPREHENSIVE METABOLIC PANEL - Abnormal;  Notable for the following components:   Potassium 2.4 (*)    CO2 21 (*)    Glucose, Bld 132 (*)    Calcium 7.7 (*)    Total Protein 5.2 (*)    Albumin 2.7 (*)    Total Bilirubin 1.4 (*)    All other components within normal limits  RESP PANEL BY RT-PCR (RSV, FLU A&B, COVID)  RVPGX2  CULTURE, BLOOD (ROUTINE X 2)  CULTURE, BLOOD (ROUTINE X 2)  EXPECTORATED SPUTUM ASSESSMENT W GRAM STAIN, RFLX TO RESP C  LIPASE, BLOOD  LACTIC ACID, PLASMA  LACTIC ACID, PLASMA  MAGNESIUM  PHOSPHORUS  HIV ANTIBODY (ROUTINE TESTING W REFLEX)  STREP PNEUMONIAE URINARY ANTIGEN  LEGIONELLA PNEUMOPHILA SEROGP 1 UR AG  PROTIME-INR  PROCALCITONIN  APTT  PREGNANCY, URINE     EKG Interpreted by me Sinus tachycardia rate 116.  Normal axis, normal intervals.  Poor R wave progression.  Normal ST segments and T waves.  No ischemic changes.   RADIOLOGY Chest x-ray interpreted by me, shows left lower lung pneumonia.   PROCEDURES:  Procedures   MEDICATIONS ORDERED IN ED: Medications  levofloxacin (  LEVAQUIN) IVPB 750 mg (has no administration in time range)  sodium chloride 0.9 % bolus 1,000 mL (has no administration in time range)  potassium chloride 10 mEq in 100 mL IVPB (has no administration in time range)  potassium chloride SA (KLOR-CON M) CR tablet 40 mEq (has no administration in time range)  albuterol (PROVENTIL) (2.5 MG/3ML) 0.083% nebulizer solution 3 mL (has no administration in time range)  dextromethorphan-guaiFENesin (MUCINEX DM) 30-600 MG per 12 hr tablet 1 tablet (has no administration in time range)  ondansetron (ZOFRAN) injection 4 mg (has no administration in time range)  acetaminophen (TYLENOL) tablet 650 mg (has no administration in time range)  hydrALAZINE (APRESOLINE) injection 5 mg (has no administration in time range)  fentaNYL (SUBLIMAZE) injection 25-100 mcg (has no administration in time range)  enoxaparin (LOVENOX) injection 47.5 mg (has no administration in time range)   0.9 %  sodium chloride infusion (has no administration in time range)  sodium chloride 0.9 % bolus 1,000 mL (1,000 mLs Intravenous New Bag/Given 12/07/22 0803)  ketorolac (TORADOL) 15 MG/ML injection 15 mg (15 mg Intravenous Given 12/07/22 0759)  ondansetron (ZOFRAN) injection 4 mg (4 mg Intravenous Given 12/07/22 0759)  pantoprazole (PROTONIX) injection 40 mg (40 mg Intravenous Given 12/07/22 0800)  ipratropium-albuterol (DUONEB) 0.5-2.5 (3) MG/3ML nebulizer solution 6 mL (6 mLs Nebulization Given 12/07/22 0805)     IMPRESSION / MDM / ASSESSMENT AND PLAN / ED COURSE  I reviewed the triage vital signs and the nursing notes.  DDx: Pneumonia, COVID/flu, dehydration, AKI, electrolyte abnormality, pancreatitis  Patient's presentation is most consistent with acute presentation with potential threat to life or bodily function.  Patient presents with shortness of breath and hypoxia, most likely infectious.  Will check chest x-ray and labs.    ----------------------------------------- 9:42 AM on 12/07/2022 ----------------------------------------- Chest x-ray demonstrates left-sided pneumonia.  Will need to admit for supplemental oxygen support, IV antibiotics given her pronounced gastrointestinal symptoms, continued hydration.  Will give oral K-Dur for her hypokalemia 2.4.  Case discussed with hospitalist.       FINAL CLINICAL IMPRESSION(S) / ED DIAGNOSES   Final diagnoses:  Pneumonia of left lower lobe due to infectious organism  Acute respiratory failure with hypoxia ([place])     Rx / DC Orders   ED Discharge Orders     None        Note:  This document was prepared using Dragon voice recognition software and may include unintentional dictation errors.   Carrie Mew, MD 12/07/22 [phone]

## 2022-12-08 DIAGNOSIS — J9601 Acute respiratory failure with hypoxia: Secondary | ICD-10-CM

## 2022-12-08 LAB — CBC
HCT: 38.2 % (ref 36.0–46.0)
Hemoglobin: 11.8 g/dL — ABNORMAL LOW (ref 12.0–15.0)
MCH: 29 pg (ref 26.0–34.0)
MCHC: 30.9 g/dL (ref 30.0–36.0)
MCV: 93.9 fL (ref 80.0–100.0)
Platelets: 149 10*3/uL — ABNORMAL LOW (ref 150–400)
RBC: 4.07 MIL/uL (ref 3.87–5.11)
RDW: 14.3 % (ref 11.5–15.5)
WBC: 7.6 10*3/uL (ref 4.0–10.5)
nRBC: 0 % (ref 0.0–0.2)

## 2022-12-08 LAB — BASIC METABOLIC PANEL
Anion gap: 6 (ref 5–15)
BUN: 11 mg/dL (ref 6–20)
CO2: 23 mmol/L (ref 22–32)
Calcium: 8.6 mg/dL — ABNORMAL LOW (ref 8.9–10.3)
Chloride: 114 mmol/L — ABNORMAL HIGH (ref 98–111)
Creatinine, Ser: 0.7 mg/dL (ref 0.44–1.00)
GFR, Estimated: >60 mL/min (ref >60)
Glucose, Bld: 91 mg/dL (ref 70–99)
Potassium: 3.9 mmol/L (ref 3.5–5.1)
Sodium: 143 mmol/L (ref 135–145)

## 2022-12-08 LAB — LEGIONELLA PNEUMOPHILA SEROGP 1 UR AG: L. pneumophila Serogp 1 Ur Ag: NEGATIVE

## 2022-12-08 LAB — LACTIC ACID, PLASMA: Lactic Acid, Venous: 1.6 mmol/L (ref 0.5–1.9)

## 2022-12-08 LAB — PHOSPHORUS: Phosphorus: 2.8 mg/dL (ref 2.5–4.6)

## 2022-12-08 MED ORDER — DIPHENOXYLATE-ATROPINE 2.5-0.025 MG PO TABS: 1.0000 | ORAL_TABLET | Freq: Four times a day (QID) | ORAL | Status: DC | PRN

## 2022-12-08 MED ORDER — ORAL CARE MOUTH RINSE: 15.0000 mL | OROMUCOSAL | Status: DC | PRN

## 2022-12-08 NOTE — Progress Notes (Signed)
Report given to nurse on 1c; ready to receive patient. Kimberly Cowan

## 2022-12-08 NOTE — TOC Progression Note (Signed)
Transition of Care [facility]) - Progression Note    Patient Details  Name: Kimberly Cowan MRN: [id number] Date of Birth: 03/13/1966  [facility] of [facility]) CM/SW Contact  Laurena Slimmer, RN Phone Number: 12/08/2022, 2:12 PM  Clinical Narrative:    Case reviewed for DME and dispostion May need new home oxygen if not weaned         Expected Discharge Plan and Services                                               Social Determinants of Health (SDOH) Interventions SDOH Screenings   Food Insecurity: No Food Insecurity (12/07/2022)  Housing: Low Risk  (12/07/2022)  Transportation Needs: No Transportation Needs (12/07/2022)  Utilities: Not At Risk (12/07/2022)  Alcohol Screen: Low Risk  (11/25/2022)  Depression (PHQ2-9): Low Risk  (11/25/2022)  Recent Concern: Depression (PHQ2-9) - Medium Risk (11/06/2022)  Financial Resource Strain: Low Risk  (11/25/2022)  Physical Activity: Sufficiently Active (11/25/2022)  Social Connections: Moderately Isolated (11/25/2022)  Stress: No Stress Concern Present (11/25/2022)  Tobacco Use: Low Risk  (12/07/2022)    Readmission Risk Interventions     No data to display

## 2022-12-08 NOTE — Progress Notes (Signed)
[place] [facility] NAME: Kimberly Cowan    MR#:  [id number]  DATE OF BIRTH:  [birth year]  SUBJECTIVE:  patient's daughter and son at bedside. Came in with generalized weakness cough and fever. Overall feels low better. She is somewhat weak. Sats drop to 88% currently on oxygen. Normally does not wear oxygen at home.    VITALS:  Blood pressure 123/83, pulse 85, temperature 98 F (36.7 C), resp. rate 18, height '5\' 7"'$  (1.702 m), weight 93 kg, SpO2 97 %.  PHYSICAL EXAMINATION:   GENERAL:  57 y.o.-year-old patient with no acute distress. Obese LUNGS: Normal breath sounds bilaterally, no wheezing CARDIOVASCULAR: S1, S2 normal. No murmur   ABDOMEN: Soft, nontender, nondistended. Bowel sounds present.  EXTREMITIES: No  edema b/l.    NEUROLOGIC: nonfocal  patient is alert and awake SKIN: No obvious rash, lesion, or ulcer.   LABORATORY PANEL:  CBC Recent Labs  Lab 12/08/22 0749  WBC 7.6  HGB 11.8*  HCT 38.2  PLT 149*    Chemistries  Recent Labs  Lab 12/07/22 0749 12/07/22 0830 12/07/22 1834 12/08/22 0749  NA  --  138   < > 143  K  --  2.4*   < > 3.9  CL  --  111   < > 114*  CO2  --  21*   < > 23  GLUCOSE  --  132*   < > 91  BUN  --  10   < > 11  CREATININE  --  0.61   < > 0.70  CALCIUM  --  7.7*   < > 8.6*  MG 2.0  --   --   --   AST  --  15  --   --   ALT  --  15  --   --   ALKPHOS  --  61  --   --   BILITOT  --  1.4*  --   --    < > = values in this interval not displayed.   Cardiac Enzymes No results for input(s): "TROPONINI" in the last 168 hours. RADIOLOGY:  US Venous Img Lower Bilateral (DVT)  Result Date: 12/07/2022 CLINICAL DATA:  Bilateral lower extremity pain and edema. History of previous DVT affecting the left lower extremity. Evaluate for acute or chronic DVT. EXAM: BILATERAL LOWER EXTREMITY VENOUS DOPPLER ULTRASOUND TECHNIQUE: Gray-scale sonography with graded compression, as well as color Doppler  and duplex ultrasound were performed to evaluate the lower extremity deep venous systems from the level of the common femoral vein and including the common femoral, femoral, profunda femoral, popliteal and calf veins including the posterior tibial, peroneal and gastrocnemius veins when visible. The superficial great saphenous vein was also interrogated. Spectral Doppler was utilized to evaluate flow at rest and with distal augmentation maneuvers in the common femoral, femoral and popliteal veins. COMPARISON:  Bilateral lower extremity venous Doppler ultrasound-10/10/2018 (negative). FINDINGS: RIGHT LOWER EXTREMITY Common Femoral Vein: No evidence of thrombus. Normal compressibility, respiratory phasicity and response to augmentation. Saphenofemoral Junction: No evidence of thrombus. Normal compressibility and flow on color Doppler imaging. Profunda Femoral Vein: No evidence of thrombus. Normal compressibility and flow on color Doppler imaging. Femoral Vein: No evidence of thrombus. Normal compressibility, respiratory phasicity and response to augmentation. Popliteal Vein: No evidence of thrombus. Normal compressibility, respiratory phasicity and response to augmentation. Calf Veins: No evidence of thrombus. Normal compressibility and flow on color Doppler imaging. Superficial Great Saphenous  Vein: No evidence of thrombus. Normal compressibility. Other Findings:  None. LEFT LOWER EXTREMITY Common Femoral Vein: No evidence of thrombus. Normal compressibility, respiratory phasicity and response to augmentation. Saphenofemoral Junction: No evidence of thrombus. Normal compressibility and flow on color Doppler imaging. Profunda Femoral Vein: No evidence of thrombus. Normal compressibility and flow on color Doppler imaging. Femoral Vein: No evidence of thrombus. Normal compressibility, respiratory phasicity and response to augmentation. Popliteal Vein: No evidence of thrombus. Normal compressibility, respiratory phasicity  and response to augmentation. Calf Veins: No evidence of thrombus. Normal compressibility and flow on color Doppler imaging. Superficial Great Saphenous Vein: No evidence of thrombus. Normal compressibility. Other Findings:  None. IMPRESSION: No evidence of acute or chronic DVT within either lower extremity. Electronically Signed   By: Sandi Mariscal M.D.   On: 12/07/2022 12:35   CT Angio Chest Pulmonary Embolism (PE) W or WO Contrast  Result Date: 12/07/2022 CLINICAL DATA:  Shortness of breath.  Cough. EXAM: CT ANGIOGRAPHY CHEST WITH CONTRAST TECHNIQUE: Multidetector CT imaging of the chest was performed using the standard protocol during bolus administration of intravenous contrast. Multiplanar CT image reconstructions and MIPs were obtained to evaluate the vascular anatomy. RADIATION DOSE REDUCTION: This exam was performed according to the departmental dose-optimization program which includes automated exposure control, adjustment of the mA and/or kV according to patient size and/or use of iterative reconstruction technique. CONTRAST:  48m OMNIPAQUE IOHEXOL 350 MG/ML SOLN COMPARISON:  Plain film of earlier today.  CTA chest 03/03/2021 FINDINGS: Cardiovascular: The quality of this exam for evaluation of pulmonary embolism is poor. Limitations include suboptimal bolus timing, with contrast centered in the SVC, patient body habitus, and arm position, not raised above the head. There is also mild motion. No pulmonary embolism to the lobar level. Bovine arch.  Mild cardiomegaly, without pericardial effusion. Mediastinum/Nodes: No mediastinal or hilar adenopathy. The esophagus is dilated and fluid-filled including on 60/4. Lungs/Pleura: No pleural fluid. Diffuse airspace disease/consolidation, greater left than right with sparing of the right upper lobe. No extra alveolar air. Upper Abdomen: Degradation continuing into the upper abdomen. Status post Roux-en-Y gastric bypass. Normal imaged portions of the liver,  spleen, pancreas, right adrenal gland, kidneys. Mild left adrenal thickening. Musculoskeletal: Dorsal spinal stimulator. Review of the MIP images confirms the above findings. IMPRESSION: 1. Poor quality evaluation for pulmonary embolism. No lobar embolism identified. 2. Left-greater-than-right airspace disease/consolidation, consistent with pneumonia or aspiration. 3. Esophageal air fluid level suggests dysmotility or gastroesophageal reflux. Electronically Signed   By: KAbigail MiyamotoM.D.   On: 12/07/2022 10:20   DG Chest Portable 1 View  Result Date: 12/07/2022 CLINICAL DATA:  Shortness of breath, cough EXAM: PORTABLE CHEST 1 VIEW COMPARISON:  03/03/2021 FINDINGS: Left mid lung and bibasilar airspace opacities compatible with pneumonia, worse in the left lung. Right hemidiaphragm is elevated as before. Negative for edema, large effusion or pneumothorax. Stable heart size. Thoracic stimulator noted. Trachea midline. IMPRESSION: Left mid lung and bibasilar airspace opacities compatible with pneumonia. Electronically Signed   By: MJerilynn Mages  Shick M.D.   On: 12/07/2022 08:36    Assessment and Plan Markasia EAveena Dephillipsis a 57y.o. female with medical history significant of left leg DVT and PE not on anticoagulants currently, hypertension, GERD, depression with anxiety, obesity, s/p for gastric bypass surgery, who presents with shortness breath and chest pain.  Patient states that she started having cough, shortness breath, chest pain 4 days ago. She coughs up little mucus. The shortness breath has been progressively worsening.  CAP (community acquired pneumonia) and severe sepsis: Initially patient had oxygen desaturation to 84% on room air per report.  Currently 92% on room air.  -- Chest x-ray showed left middle lobe and bilateral basilar infiltration, indicating possible pneumonia, however her temperature is normal currently, no leukocytosis (WBC 3.0), --Patient meets criteria for severe sepsis with WBC  3.0, heart rate 120, RR 28, lactic acid 3.3.  --IV levaquin - Mucinex for cough  - Bronchodilators prn --wean to RA if able to   CTA negative for PE 1. Poor quality evaluation for pulmonary embolism. No lobar embolism identified. 2. Left-greater-than-right airspace disease/consolidation, consistent with pneumonia or aspiration. 3. Esophageal air fluid level suggests dysmotility gastroesophageal reflux.   Nausea vomiting and diarrhea: -Follow-up C. difficile and GI pathogen panel--no BM   Hypertension: Blood pressure soft -Hold Lasix -IV hydralazine as needed   Hypokalemia:/Hypophosphatamia  Potassium 2.4 -Repleted potassium 3.9   Depression with anxiety -Continue home medications   OSA - on CPAP   Obesity (BMI 30-39.9): S/p of gastric bypass.  Body weight 93 kg, BMI 32.11 -Exercise and healthy diet -Encourage losing weight           DVT ppx: sq Lovenox   Code Status: Full code   Family Communication:  none today   Disposition Plan:  Anticipate discharge back to previous environment   Level of care: Med-Surg Status is: Inpatient Remains inpatient appropriate because: improving--if remains stable d/c in am    TOTAL TIME TAKING CARE OF THIS PATIENT: 35 minutes.  >50% time spent on counselling and coordination of care  Note: This dictation was prepared with Dragon dictation along with smaller phrase technology. Any transcriptional errors that result from this process are unintentional.  Fritzi Mandes M.D    [facility]   CC: Primary care physician; Glean Hess, MD

## 2022-12-08 NOTE — Progress Notes (Signed)
Clarified with Dr. Posey Pronto. Patient does not need stool specimen sent. Order d/c.

## 2022-12-09 ENCOUNTER — Other Ambulatory Visit: Payer: Self-pay | Admitting: Internal Medicine

## 2022-12-09 DIAGNOSIS — I10 Essential (primary) hypertension: Secondary | ICD-10-CM

## 2022-12-09 DIAGNOSIS — F411 Generalized anxiety disorder: Secondary | ICD-10-CM

## 2022-12-09 DIAGNOSIS — R6520 Severe sepsis without septic shock: Secondary | ICD-10-CM

## 2022-12-09 DIAGNOSIS — A419 Sepsis, unspecified organism: Principal | ICD-10-CM

## 2022-12-09 LAB — BASIC METABOLIC PANEL
Anion gap: 3 — ABNORMAL LOW (ref 5–15)
BUN: 9 mg/dL (ref 6–20)
CO2: 27 mmol/L (ref 22–32)
Calcium: 9.2 mg/dL (ref 8.9–10.3)
Chloride: 109 mmol/L (ref 98–111)
Creatinine, Ser: 0.65 mg/dL (ref 0.44–1.00)
GFR, Estimated: >60 mL/min (ref >60)
Glucose, Bld: 90 mg/dL (ref 70–99)
Potassium: 3.9 mmol/L (ref 3.5–5.1)
Sodium: 139 mmol/L (ref 135–145)

## 2022-12-09 MED ORDER — LEVOFLOXACIN 750 MG PO TABS
750.0000 mg | ORAL_TABLET | Freq: Every day | ORAL | Status: AC
  Administered 2022-12-09 – 2022-12-10 (×2): 750 mg via ORAL
  Filled 2022-12-09 (×2): qty 1

## 2022-12-09 MED ORDER — ALBUTEROL SULFATE (2.5 MG/3ML) 0.083% IN NEBU
3.0000 mL | INHALATION_SOLUTION | Freq: Three times a day (TID) | RESPIRATORY_TRACT | Status: DC
  Administered 2022-12-09 – 2022-12-10 (×2): 3 mL via RESPIRATORY_TRACT
  Filled 2022-12-09 (×2): qty 3

## 2022-12-09 MED ORDER — CHLORHEXIDINE GLUCONATE CLOTH 2 % EX PADS
6.0000 | MEDICATED_PAD | Freq: Every day | CUTANEOUS | Status: DC
  Administered 2022-12-09 – 2022-12-10 (×2): 6 via TOPICAL

## 2022-12-09 MED ORDER — DM-GUAIFENESIN ER 30-600 MG PO TB12
1.0000 | ORAL_TABLET | Freq: Two times a day (BID) | ORAL | Status: DC
  Administered 2022-12-09 – 2022-12-10 (×3): 1 via ORAL
  Filled 2022-12-09 (×3): qty 1

## 2022-12-09 MED ORDER — ALBUTEROL SULFATE (2.5 MG/3ML) 0.083% IN NEBU
3.0000 mL | INHALATION_SOLUTION | Freq: Four times a day (QID) | RESPIRATORY_TRACT | Status: DC
  Administered 2022-12-09: 3 mL via RESPIRATORY_TRACT
  Filled 2022-12-09: qty 3

## 2022-12-09 MED ORDER — FUROSEMIDE 20 MG PO TABS
20.0000 mg | ORAL_TABLET | Freq: Every day | ORAL | Status: DC
  Filled 2022-12-09: qty 1

## 2022-12-09 MED ORDER — MUPIROCIN 2 % EX OINT
1.0000 | TOPICAL_OINTMENT | Freq: Two times a day (BID) | CUTANEOUS | Status: DC
  Filled 2022-12-09: qty 22

## 2022-12-09 NOTE — Progress Notes (Signed)
Mobility Specialist - Progress Note   12/09/22 1116  Mobility  Activity Ambulated independently in hallway;Stood at bedside;Dangled on edge of bed  Level of Assistance Independent  Assistive Device Front wheel walker  Distance Ambulated (ft) 240 ft  Activity Response Tolerated well  Mobility Referral Yes  $Mobility charge 1 Mobility   Pt supine in bed on 2L upon arrival. Pt changes gowns, STS, and ambulates in hallway indep without O2. Pt SPO2 stays above 90 entire session. Pt returns to bed with needs in reach.   SATURATION QUALIFICATIONS: (This note is used to comply with regulatory documentation for home oxygen)  Patient Saturations on Room Air at Rest = 93%  Patient Saturations on Room Air while Ambulating = 91%  [employer]  Mobility Specialist  12/09/22 11:24 AM

## 2022-12-09 NOTE — Hospital Course (Addendum)
57 y.o. female with medical history significant of left leg DVT and PE not on anticoagulants currently, hypertension, GERD, depression with anxiety, obesity, s/p for gastric bypass surgery in 2005 and revision bypass in 2021, who presents with shortness breath and chest pain.  Admitted for severe sepsis due to community-acquired pneumonia present on admission  3/13: Switched to oral antibiotic and weaning oxygen.  Ambulation.  Still reports coughing that she is not able to expectorate, Mucinex and nebulizer schedule instead of as needed

## 2022-12-09 NOTE — Progress Notes (Signed)
Mobility Specialist - Progress Note   12/09/22 1451  Mobility  Activity Ambulated independently in hallway;Stood at bedside;Dangled on edge of bed  Level of Assistance Independent  Assistive Device Front wheel walker  Distance Ambulated (ft) 240 ft  Activity Response Tolerated well  Mobility Referral Yes  $Mobility charge 1 Mobility   Pt supine in bed on 1L upon arrival. Pt STS and ambulates in hallway indep with no LOB noted. Pt returns to bed with needs in reach.  Kimberly Cowan  Mobility Specialist  12/09/22 2:52 PM

## 2022-12-09 NOTE — Progress Notes (Signed)
  Progress Note   Patient: Kimberly Cowan [id number] DOB: 1965/12/04 DOA: 12/07/2022     2 DOS: the patient was seen and examined on 12/09/2022   Brief hospital course: 57 y.o. female with medical history significant of left leg DVT and PE not on anticoagulants currently, hypertension, GERD, depression with anxiety, obesity, s/p for gastric bypass surgery in 2005 and revision bypass in 2021, who presents with shortness breath and chest pain.  Admitted for severe sepsis due to community-acquired pneumonia present on admission  3/13: Switched to oral antibiotic and weaning oxygen.  Ambulation.  Still reports coughing that she is not able to expectorate, Mucinex and nebulizer schedule instead of as needed  Assessment and Plan:  CAP (community acquired pneumonia) and severe sepsis: Initially patient had oxygen desaturation to 84% on room air per report.  Currently 93% on room air and 91% at ambulation Per mobility specialist note this morning -- Chest x-ray showed left middle lobe and bilateral basilar infiltration, indicating possible pneumonia, however her temperature is normal currently, no leukocytosis (WBC 7.6), --Patient meets criteria for severe sepsis with WBC 3.0, heart rate 120, RR 28, lactic acid 3.3-> 4.6-> 1.6  -- Change IV to oral levaquin -Change Mucinex for cough from as needed to scheduled - Bronchodilators prn --wean to RA if able to   CTA negative for PE 1. Poor quality evaluation for pulmonary embolism. No lobar embolism identified. 2. Left-greater-than-right airspace disease/consolidation, consistent with pneumonia or aspiration. 3. Esophageal air fluid level suggests dysmotility gastroesophageal reflux.   Nausea vomiting and diarrhea: -Follow-up C. difficile and GI pathogen panel--no BM Now resolved.  Tolerating diet   Hypertension:  -Resume Lasix -Discontinue hydralazine   Hypokalemia:/Hypophosphatamia Repleted and resolved   Depression with  anxiety -Continue home medications   OSA - on CPAP   Obesity (BMI 30-39.9): S/p of gastric bypass.  Body weight 93 kg, BMI 32.11 -Exercise and healthy diet -Encourage losing weight     Subjective: Still coughing and not able to expectorate, feeling short of breath.  Has not walked yet  Physical Exam: Vitals:   12/08/22 2042 12/09/22 0521 12/09/22 0754 12/09/22 1138  BP: 126/84 121/74 118/72   Pulse: 81 85 80   Resp: 18 18 19    Temp: 97.9 F (36.6 C) 98.4 F (36.9 C) 98.3 F (36.8 C)   TempSrc: Oral Oral Oral   SpO2: 97% 98% 98% 98%  Weight:      Height:       GENERAL:  57 y.o.-year-old patient with no acute distress. Obese LUNGS: Normal breath sounds bilaterally, no wheezing CARDIOVASCULAR: S1, S2 normal. No murmur   ABDOMEN: Soft, nontender, nondistended. Bowel sounds present.  EXTREMITIES: No  edema b/l.    NEUROLOGIC: nonfocal  patient is alert and awake SKIN: No obvious rash, lesion, or ulcer.  Data Reviewed:  There are no new results to review at this time.  Family Communication: None at bedside  Disposition: Status is: Inpatient Remains inpatient appropriate because: Management of pneumonia  Planned Discharge Destination: Home   DVT prophylaxis-Lovenox Time spent: 35 minutes  Author: Max Sane, MD 12/09/2022 12:00 PM  For on call review [email or web address].

## 2022-12-10 LAB — CBC
HCT: 32.8 % — ABNORMAL LOW (ref 36.0–46.0)
Hemoglobin: 10.3 g/dL — ABNORMAL LOW (ref 12.0–15.0)
MCH: 29.4 pg (ref 26.0–34.0)
MCHC: 31.4 g/dL (ref 30.0–36.0)
MCV: 93.7 fL (ref 80.0–100.0)
Platelets: 157 10*3/uL (ref 150–400)
RBC: 3.5 MIL/uL — ABNORMAL LOW (ref 3.87–5.11)
RDW: 14.2 % (ref 11.5–15.5)
WBC: 5 10*3/uL (ref 4.0–10.5)
nRBC: 0 % (ref 0.0–0.2)

## 2022-12-10 LAB — BASIC METABOLIC PANEL
Anion gap: 4 — ABNORMAL LOW (ref 5–15)
BUN: 8 mg/dL (ref 6–20)
CO2: 25 mmol/L (ref 22–32)
Calcium: 9 mg/dL (ref 8.9–10.3)
Chloride: 110 mmol/L (ref 98–111)
Creatinine, Ser: 0.57 mg/dL (ref 0.44–1.00)
GFR, Estimated: >60 mL/min (ref >60)
Glucose, Bld: 87 mg/dL (ref 70–99)
Potassium: 3.6 mmol/L (ref 3.5–5.1)
Sodium: 139 mmol/L (ref 135–145)

## 2022-12-10 MED ORDER — DM-GUAIFENESIN ER 30-600 MG PO TB12: 1.0000 | ORAL_TABLET | Freq: Two times a day (BID) | ORAL | 0 refills | Status: AC

## 2022-12-10 MED ORDER — LEVOFLOXACIN 750 MG PO TABS: 750.0000 mg | ORAL_TABLET | Freq: Every day | ORAL | 0 refills | Status: AC

## 2022-12-10 NOTE — Discharge Summary (Signed)
Physician Discharge Summary   Patient: Kimberly Cowan MRN: [id number] DOB: May 22, 1966  Admit date:     12/07/2022  Discharge date: 12/10/22  Discharge Physician: Max Sane   PCP: Glean Hess, MD   Recommendations at discharge:   Follow-up with outpatient providers as requested  Discharge Diagnoses: Principal Problem:   CAP (community acquired pneumonia) Active Problems:   Severe sepsis (HCC)   Nausea vomiting and diarrhea   Hypokalemia   HTN (hypertension)   Depression with anxiety   OSA on CPAP   Obesity (BMI 30-39.9)  Hospital Course: 57 y.o. female with medical history significant of left leg DVT and PE not on anticoagulants currently, hypertension, GERD, depression with anxiety, obesity, s/p for gastric bypass surgery in 2005 and revision bypass in 2021, who presents with shortness breath and chest pain.  Admitted for severe sepsis due to community-acquired pneumonia present on admission  3/13: Switched to oral antibiotic and weaning oxygen.  Ambulation.  Still reports coughing that she is not able to expectorate, Mucinex and nebulizer schedule instead of as needed  Assessment and Plan: CAP (community acquired pneumonia) and severe sepsis: Initially patient had oxygen desaturation to 84% on room air per report.  Currently 93% on room air and 91% at ambulation Per mobility specialist note this morning -- Chest x-ray showed left middle lobe and bilateral basilar infiltration, indicating possible pneumonia, however her temperature is normal currently, no leukocytosis (WBC 7.6), --Patient meets criteria for severe sepsis with WBC 3.0, heart rate 120, RR 28, lactic acid 3.3-> 4.6-> 1.6  -- Treated with IV antibiotics while in the hospital and transition to oral antibiotics at discharge -Now on room air   Nausea vomiting and diarrhea: -Follow-up C. difficile and GI pathogen panel--no BM Now resolved.  Tolerating diet   Hypertension:   Hypokalemia:/Hypophosphatamia Repleted and resolved   Depression with anxiety -Continue home medications   OSA - on CPAP   Obesity (BMI 30-39.9): S/p of gastric bypass.  Body weight 93 kg, BMI 32.11 -Exercise and healthy diet -Encourage losing weight          Disposition: Home Diet recommendation:  Discharge Diet Orders (From admission, onward)     Start     Ordered   12/10/22 0000  Diet - low sodium heart healthy        12/10/22 0947           Carb modified diet DISCHARGE MEDICATION: Allergies as of 12/10/2022       Reactions   Morphine And Related Hives, Shortness Of Breath, Nausea And Vomiting, Other (See Comments)   "can't breathe"   Penicillins Anaphylaxis, Hives, Shortness Of Breath, Nausea And Vomiting   "can't breathe", Has patient had a PCN reaction causing immediate rash, facial/tongue/throat swelling, SOB or lightheadedness with hypotension: Yes Has patient had a PCN reaction causing severe rash involving mucus membranes or skin necrosis: No Has patient had a PCN reaction that required hospitalization No Has patient had a PCN reaction occurring within the last 10 years: No If all of the above answers are "NO", then may proceed with Cephalosporin use.   Latex Hives   Baclofen Nausea Only   Jittery, anxious        Medication List     TAKE these medications    cyanocobalamin 1000 MCG/ML injection Commonly known as: VITAMIN B12 ADMINISTER 1 ML(1000 MCG) IN THE MUSCLE EVERY 30 DAYS   cyclobenzaprine 10 MG tablet Commonly known as: FLEXERIL Take 10 mg by mouth daily as  needed for muscle spasms.   dextromethorphan-guaiFENesin 30-600 MG 12hr tablet Commonly known as: MUCINEX DM Take 1 tablet by mouth 2 (two) times daily for 5 days.   diphenoxylate-atropine 2.5-0.025 MG tablet Commonly known as: LOMOTIL Take 2 tablets by mouth 4 (four) times daily.   fluticasone 50 MCG/ACT nasal spray Commonly known as: FLONASE Place 1 spray into both  nostrils daily as needed for allergies.   furosemide 20 MG tablet Commonly known as: LASIX TAKE 1 TABLET(20 MG) BY MOUTH DAILY   gabapentin 300 MG capsule Commonly known as: NEURONTIN Take 2 capsules (600 mg total) by mouth at bedtime.   latanoprost 0.005 % ophthalmic solution Commonly known as: XALATAN Place 1 drop into both eyes at bedtime.   levofloxacin 750 MG tablet Commonly known as: Levaquin Take 1 tablet (750 mg total) by mouth daily for 2 days.   MULTIVITAMIN GUMMIES ADULTS PO Take 1 tablet by mouth daily.   pantoprazole 40 MG tablet Commonly known as: PROTONIX Take 40 mg by mouth 2 (two) times daily.   potassium chloride 10 MEQ tablet Commonly known as: KLOR-CON Take 10 mEq by mouth 2 (two) times daily.   rizatriptan 10 MG disintegrating tablet Commonly known as: MAXALT-MLT DISSOLVE 1 TABLET BY MOUTH AS NEEDED FOR MIGRAINE- MAY REPEAT IN 2 HOURS AS NEEDED   sertraline 100 MG tablet Commonly known as: ZOLOFT Take 1 tablet (100 mg total) by mouth daily.   temazepam 15 MG capsule Commonly known as: RESTORIL Take 1 capsule (15 mg total) by mouth at bedtime as needed for sleep.        Follow-up Information     Glean Hess, MD. Go on 12/18/2022.   Specialty: Internal Medicine Why: '@2'$ :00pm: arrive at 1:45pm Contact information: [address] [place] [address] [phone]                Discharge Exam: Danley Danker Weights   12/07/22 0742  Weight: 93 kg   GENERAL:  57 y.o.-year-old patient with no acute distress. Obese LUNGS: Normal breath sounds bilaterally, no wheezing CARDIOVASCULAR: S1, S2 normal. No murmur   ABDOMEN: Soft, nontender, nondistended. Bowel sounds present.  EXTREMITIES: No  edema b/l.    NEUROLOGIC: nonfocal  patient is alert and awake SKIN: No obvious rash, lesion, or ulcer.   Condition at discharge: good  The results of significant diagnostics from this hospitalization (including imaging, microbiology,  ancillary and laboratory) are listed below for reference.   Imaging Studies: US Venous Img Lower Bilateral (DVT)  Result Date: 12/07/2022 CLINICAL DATA:  Bilateral lower extremity pain and edema. History of previous DVT affecting the left lower extremity. Evaluate for acute or chronic DVT. EXAM: BILATERAL LOWER EXTREMITY VENOUS DOPPLER ULTRASOUND TECHNIQUE: Gray-scale sonography with graded compression, as well as color Doppler and duplex ultrasound were performed to evaluate the lower extremity deep venous systems from the level of the common femoral vein and including the common femoral, femoral, profunda femoral, popliteal and calf veins including the posterior tibial, peroneal and gastrocnemius veins when visible. The superficial great saphenous vein was also interrogated. Spectral Doppler was utilized to evaluate flow at rest and with distal augmentation maneuvers in the common femoral, femoral and popliteal veins. COMPARISON:  Bilateral lower extremity venous Doppler ultrasound-10/10/2018 (negative). FINDINGS: RIGHT LOWER EXTREMITY Common Femoral Vein: No evidence of thrombus. Normal compressibility, respiratory phasicity and response to augmentation. Saphenofemoral Junction: No evidence of thrombus. Normal compressibility and flow on color Doppler imaging. Profunda Femoral Vein: No evidence of thrombus.  Normal compressibility and flow on color Doppler imaging. Femoral Vein: No evidence of thrombus. Normal compressibility, respiratory phasicity and response to augmentation. Popliteal Vein: No evidence of thrombus. Normal compressibility, respiratory phasicity and response to augmentation. Calf Veins: No evidence of thrombus. Normal compressibility and flow on color Doppler imaging. Superficial Great Saphenous Vein: No evidence of thrombus. Normal compressibility. Other Findings:  None. LEFT LOWER EXTREMITY Common Femoral Vein: No evidence of thrombus. Normal compressibility, respiratory phasicity and  response to augmentation. Saphenofemoral Junction: No evidence of thrombus. Normal compressibility and flow on color Doppler imaging. Profunda Femoral Vein: No evidence of thrombus. Normal compressibility and flow on color Doppler imaging. Femoral Vein: No evidence of thrombus. Normal compressibility, respiratory phasicity and response to augmentation. Popliteal Vein: No evidence of thrombus. Normal compressibility, respiratory phasicity and response to augmentation. Calf Veins: No evidence of thrombus. Normal compressibility and flow on color Doppler imaging. Superficial Great Saphenous Vein: No evidence of thrombus. Normal compressibility. Other Findings:  None. IMPRESSION: No evidence of acute or chronic DVT within either lower extremity. Electronically Signed   By: Sandi Mariscal M.D.   On: 12/07/2022 12:35   CT Angio Chest Pulmonary Embolism (PE) W or WO Contrast  Result Date: 12/07/2022 CLINICAL DATA:  Shortness of breath.  Cough. EXAM: CT ANGIOGRAPHY CHEST WITH CONTRAST TECHNIQUE: Multidetector CT imaging of the chest was performed using the standard protocol during bolus administration of intravenous contrast. Multiplanar CT image reconstructions and MIPs were obtained to evaluate the vascular anatomy. RADIATION DOSE REDUCTION: This exam was performed according to the departmental dose-optimization program which includes automated exposure control, adjustment of the mA and/or kV according to patient size and/or use of iterative reconstruction technique. CONTRAST:  66m OMNIPAQUE IOHEXOL 350 MG/ML SOLN COMPARISON:  Plain film of earlier today.  CTA chest 03/03/2021 FINDINGS: Cardiovascular: The quality of this exam for evaluation of pulmonary embolism is poor. Limitations include suboptimal bolus timing, with contrast centered in the SVC, patient body habitus, and arm position, not raised above the head. There is also mild motion. No pulmonary embolism to the lobar level. Bovine arch.  Mild cardiomegaly,  without pericardial effusion. Mediastinum/Nodes: No mediastinal or hilar adenopathy. The esophagus is dilated and fluid-filled including on 60/4. Lungs/Pleura: No pleural fluid. Diffuse airspace disease/consolidation, greater left than right with sparing of the right upper lobe. No extra alveolar air. Upper Abdomen: Degradation continuing into the upper abdomen. Status post Roux-en-Y gastric bypass. Normal imaged portions of the liver, spleen, pancreas, right adrenal gland, kidneys. Mild left adrenal thickening. Musculoskeletal: Dorsal spinal stimulator. Review of the MIP images confirms the above findings. IMPRESSION: 1. Poor quality evaluation for pulmonary embolism. No lobar embolism identified. 2. Left-greater-than-right airspace disease/consolidation, consistent with pneumonia or aspiration. 3. Esophageal air fluid level suggests dysmotility or gastroesophageal reflux. Electronically Signed   By: KAbigail MiyamotoM.D.   On: 12/07/2022 10:20   DG Chest Portable 1 View  Result Date: 12/07/2022 CLINICAL DATA:  Shortness of breath, cough EXAM: PORTABLE CHEST 1 VIEW COMPARISON:  03/03/2021 FINDINGS: Left mid lung and bibasilar airspace opacities compatible with pneumonia, worse in the left lung. Right hemidiaphragm is elevated as before. Negative for edema, large effusion or pneumothorax. Stable heart size. Thoracic stimulator noted. Trachea midline. IMPRESSION: Left mid lung and bibasilar airspace opacities compatible with pneumonia. Electronically Signed   By: MJerilynn Mages  Shick M.D.   On: 12/07/2022 08:36    Microbiology: Results for orders placed or performed during the hospital encounter of 12/07/22  Resp panel by RT-PCR (RSV,  Flu A&B, Covid) Anterior Nasal Swab     Status: None   Collection Time: 12/07/22  7:49 AM   Specimen: Anterior Nasal Swab  Result Value Ref Range Status   SARS Coronavirus 2 by RT PCR NEGATIVE NEGATIVE Final    Comment: (NOTE) SARS-CoV-2 target nucleic acids are NOT DETECTED.  The  SARS-CoV-2 RNA is generally detectable in upper respiratory specimens during the acute phase of infection. The lowest concentration of SARS-CoV-2 viral copies this assay can detect is 138 copies/mL. A negative result does not preclude SARS-Cov-2 infection and should not be used as the sole basis for treatment or other patient management decisions. A negative result may occur with  improper specimen collection/handling, submission of specimen other than nasopharyngeal swab, presence of viral mutation(s) within the areas targeted by this assay, and inadequate number of viral copies(<138 copies/mL). A negative result must be combined with clinical observations, patient history, and epidemiological information. The expected result is Negative.  Fact Sheet for Patients:  EntrepreneurPulse.com.au  Fact Sheet for Healthcare Providers:  IncredibleEmployment.be  This test is no t yet approved or cleared by the [place] FDA and  has been authorized for detection and/or diagnosis of SARS-CoV-2 by FDA under an Emergency Use Authorization (EUA). This EUA will remain  in effect (meaning this test can be used) for the duration of the COVID-19 declaration under Section 564(b)(1) of the Act, 21 U.S.C.section 360bbb-3(b)(1), unless the authorization is terminated  or revoked sooner.       Influenza A by PCR NEGATIVE NEGATIVE Final   Influenza B by PCR NEGATIVE NEGATIVE Final    Comment: (NOTE) The Xpert Xpress SARS-CoV-2/FLU/RSV plus assay is intended as an aid in the diagnosis of influenza from Nasopharyngeal swab specimens and should not be used as a sole basis for treatment. Nasal washings and aspirates are unacceptable for Xpert Xpress SARS-CoV-2/FLU/RSV testing.  Fact Sheet for Patients: EntrepreneurPulse.com.au  Fact Sheet for Healthcare Providers: IncredibleEmployment.be  This test is not yet approved or  cleared by the [place] FDA and has been authorized for detection and/or diagnosis of SARS-CoV-2 by FDA under an Emergency Use Authorization (EUA). This EUA will remain in effect (meaning this test can be used) for the duration of the COVID-19 declaration under Section 564(b)(1) of the Act, 21 U.S.C. section 360bbb-3(b)(1), unless the authorization is terminated or revoked.     Resp Syncytial Virus by PCR NEGATIVE NEGATIVE Final    Comment: (NOTE) Fact Sheet for Patients: EntrepreneurPulse.com.au  Fact Sheet for Healthcare Providers: IncredibleEmployment.be  This test is not yet approved or cleared by the [place] FDA and has been authorized for detection and/or diagnosis of SARS-CoV-2 by FDA under an Emergency Use Authorization (EUA). This EUA will remain in effect (meaning this test can be used) for the duration of the COVID-19 declaration under Section 564(b)(1) of the Act, 21 U.S.C. section 360bbb-3(b)(1), unless the authorization is terminated or revoked.  Performed at [facility], [address] [place], [place] [address]   Blood Culture (routine x 2)     Status: None (Preliminary result)   Collection Time: 12/07/22  9:46 AM   Specimen: BLOOD RIGHT HAND  Result Value Ref Range Status   Specimen Description BLOOD RIGHT HAND  Final   Special Requests AEROBIC BOTTLE ONLY Blood Culture adequate volume  Final   Culture   Final    NO GROWTH 3 DAYS Performed at [facility], [address], [place], [place] [address]    Report Status  PENDING  Incomplete  Blood Culture (routine x 2)     Status: None (Preliminary result)   Collection Time: 12/07/22  9:47 AM   Specimen: BLOOD LEFT ARM  Result Value Ref Range Status   Specimen Description BLOOD LEFT ARM  Final   Special Requests   Final    BOTTLES DRAWN AEROBIC AND ANAEROBIC Blood Culture adequate volume   Culture   Final    NO GROWTH 3 DAYS Performed at  [facility], [address] [place], [place] [address]    Report Status PENDING  Incomplete    Labs: CBC: Recent Labs  Lab 12/07/22 0749 12/08/22 0749 12/10/22 0611  WBC 3.0* 7.6 5.0  NEUTROABS 1.7  --   --   HGB 12.5 11.8* 10.3*  HCT 40.9 38.2 32.8*  MCV 95.3 93.9 93.7  PLT 165 149* [id number]   Basic Metabolic Panel: Recent Labs  Lab 12/07/22 0749 12/07/22 0830 12/07/22 1834 12/08/22 0749 12/09/22 0754 12/10/22 0611  NA  --  138 140 143 139 139  K  --  2.4* 3.6 3.9 3.9 3.6  CL  --  111 113* 114* 109 110  CO2  --  21* '22 23 27 25  '$ GLUCOSE  --  132* 162* 91 90 87  BUN  --  '10 12 11 9 8  '$ CREATININE  --  0.61 0.82 0.70 0.65 0.57  CALCIUM  --  7.7* 8.5* 8.6* 9.2 9.0  MG 2.0  --   --   --   --   --   PHOS 1.8*  --  1.4* 2.8  --   --    Liver Function Tests: Recent Labs  Lab 12/07/22 0830 12/07/22 1834  AST 15  --   ALT 15  --   ALKPHOS 61  --   BILITOT 1.4*  --   PROT 5.2*  --   ALBUMIN 2.7* 2.9*   CBG: No results for input(s): "GLUCAP" in the last 168 hours.  Discharge time spent: greater than 30 minutes.  Signed: Max Sane, MD [facility] 12/10/2022

## 2022-12-11 ENCOUNTER — Telehealth: Payer: Self-pay

## 2022-12-11 NOTE — Transitions of Care (Post Inpatient/ED Visit) (Signed)
   12/11/2022  Name: Kimberly Cowan MRN: [id number] DOB: [birth year]  Today's TOC FU Call Status: Today's TOC FU Call Status:: Successful TOC FU Call Competed TOC FU Call Complete Date: 12/11/22  Transition Care Management Follow-up Telephone Call Date of Discharge: 12/10/22 Discharge Facility: [facility]) Type of Discharge: Inpatient Admission Primary Inpatient Discharge Diagnosis:: "PNA-LLL" How have you been since you were released from the hospital?: Better (Patient states breathing is much better. Denies any SOB or coughing. States she is using incentive spirometry several times per day.) Any questions or concerns?: Yes Patient Questions/Concerns:: Patient wanting to move appt to be seen sooner so that she can return to work as she must be seen and provided written documentation to employer that she can return to work Patient Questions/Concerns Addressed: Other: (care guide assisting with patient's request during this call)  Items Reviewed: Did you receive and understand the discharge instructions provided?: Yes Medications obtained and verified?: Yes (Medications Reviewed) Any new allergies since your discharge?: No Dietary orders reviewed?: Yes Type of Diet Ordered:: low salt/heart healthy Do you have support at home?: Yes People in Home: child(ren), adult Name of Support/Comfort Primary Source: [place] and Equipment/Supplies: [place] Ordered?: NA Any new equipment or medical supplies ordered?: NA  Functional Questionnaire: Do you need assistance with bathing/showering or dressing?: No Do you need assistance with meal preparation?: No Do you need assistance with eating?: No Do you have difficulty maintaining continence: No Do you need assistance with getting out of bed/getting out of a chair/moving?: No Do you have difficulty managing or taking your medications?: No  Folllow up appointments  reviewed: PCP Follow-up appointment confirmed?: Yes Date of PCP follow-up appointment?: 12/14/22 Follow-up Provider: [facility] Follow-up appointment confirmed?: NA Do you need transportation to your follow-up appointment?: No  SDOH Interventions Today    Flowsheet Row Most Recent Value  SDOH Interventions   Food Insecurity Interventions Intervention Not Indicated      TOC Interventions Today    Flowsheet Row Most Recent Value  TOC Interventions   TOC Interventions Discussed/Reviewed TOC Interventions Discussed, Arranged PCP follow up within 7 days/Care Guide scheduled  [pt reported  PCP appt not until 12/18/22-needs to beseen sooner to get medically cleared to return to work by per employer and would like to move appt up sooner-will see any provider available-care guide assisted with patient's request]       Interventions Today    Flowsheet Row Most Recent Value  Education Interventions   Education Provided Provided Education  [resp mgmt, incentive spirometry usage-breathing]  Provided Verbal Education On Nutrition, When to see the doctor, Medication, Other  Nutrition Interventions   Nutrition Discussed/Reviewed Nutrition Discussed, Decreasing salt  Pharmacy Interventions   Pharmacy Dicussed/Reviewed Pharmacy Topics Discussed, Medications and their functions  Safety Interventions   Safety Discussed/Reviewed Safety Discussed      Hetty Blend [facility] Care Management Care Management Community Coordinator Direct Phone: [phone] Toll Free: [phone] Fax: [phone]

## 2022-12-12 LAB — CULTURE, BLOOD (ROUTINE X 2)
Culture: NO GROWTH
Culture: NO GROWTH
Special Requests: ADEQUATE
Special Requests: ADEQUATE

## 2022-12-14 ENCOUNTER — Ambulatory Visit (INDEPENDENT_AMBULATORY_CARE_PROVIDER_SITE_OTHER): Payer: BC Managed Care – PPO | Admitting: Physician Assistant

## 2022-12-14 ENCOUNTER — Encounter: Payer: Self-pay | Admitting: Physician Assistant

## 2022-12-14 ENCOUNTER — Encounter: Payer: Self-pay | Admitting: Internal Medicine

## 2022-12-14 VITALS — BP 118/82 | HR 75 | Temp 98.5°F | Ht 67.0 in | Wt 207.0 lb

## 2022-12-14 DIAGNOSIS — J189 Pneumonia, unspecified organism: Secondary | ICD-10-CM | POA: Diagnosis not present

## 2022-12-14 NOTE — Progress Notes (Signed)
Date:  12/14/2022   Name:  Kimberly Cowan   DOB:  16-Sep-1966   MRN:  [id number]  Admitted to [facility] 12/07/2022-12/10/2022.  TOC call completed 12/11/2022  Chief Complaint: Hospitalization Follow-up  HPI Kimberly Cowan "[name]" is a pleasant 57 year old female who presents new to me today, typically sees my colleague Dr. Halina Maidens, MD, here for hospital follow-up on pneumonia of left lung with sepsis, treated with IV antibiotics in hospital and discharged 12/10/2022 with oral levofloxacin, now completed.  Patient reports significant improvement since discharge and is ready to go back to work (from home), desires note to return tomorrow. She is still using incentive spirometer with good compliance. Denies fever, dyspnea, productive cough, chest pain.  Recent Labs     Component Value Date/Time   NA 139 12/10/2022 0611   NA 143 10/08/2022 1631   NA 138 10/31/2014 0731   K 3.6 12/10/2022 0611   K 3.8 10/31/2014 0731   CL 110 12/10/2022 0611   CL 104 10/31/2014 0731   CO2 25 12/10/2022 0611   CO2 26 10/31/2014 0731   GLUCOSE 87 12/10/2022 0611   GLUCOSE 93 10/31/2014 0731   BUN 8 12/10/2022 0611   BUN 12 10/08/2022 1631   BUN 8 10/31/2014 0731   CREATININE 0.57 12/10/2022 0611   CREATININE 0.69 10/31/2014 0731   CALCIUM 9.0 12/10/2022 0611   CALCIUM 9.1 10/31/2014 0731   PROT 5.2 (L) 12/07/2022 0830   PROT 6.8 10/08/2022 1631   PROT 7.9 10/31/2014 0731   ALBUMIN 2.9 (L) 12/07/2022 1834   ALBUMIN 3.8 10/08/2022 1631   ALBUMIN 3.4 10/31/2014 0731   AST 15 12/07/2022 0830   AST 17 10/31/2014 0731   ALT 15 12/07/2022 0830   ALT 16 10/31/2014 0731   ALKPHOS 61 12/07/2022 0830   ALKPHOS 85 10/31/2014 0731   BILITOT 1.4 (H) 12/07/2022 0830   BILITOT 0.9 10/08/2022 1631   BILITOT 0.5 10/31/2014 0731   GFRNONAA >60 12/10/2022 0611   GFRNONAA >60 10/31/2014 0731   GFRAA 100 07/24/2020 1554   GFRAA >60 10/31/2014 0731    Lab Results  Component Value Date    WBC 5.0 12/10/2022   HGB 10.3 (L) 12/10/2022   HCT 32.8 (L) 12/10/2022   MCV 93.7 12/10/2022   PLT 157 12/10/2022   Lab Results  Component Value Date   HGBA1C 5.5 10/02/2021   Lab Results  Component Value Date   CHOL 135 02/19/2022   HDL 65 02/19/2022   LDLCALC 55 02/19/2022   TRIG 74 02/19/2022   CHOLHDL 1.8 10/02/2021   Lab Results  Component Value Date   TSH 2.150 10/08/2022    Review of Systems  Constitutional:  Negative for chills, fatigue and fever.  Respiratory:  Negative for cough, chest tightness, shortness of breath and wheezing.   Cardiovascular:  Negative for chest pain.    Patient Active Problem List   Diagnosis Date Noted   CAP (community acquired pneumonia) 12/07/2022   Depression with anxiety 12/07/2022   Nausea vomiting and diarrhea 12/07/2022   Obesity (BMI 30-39.9) 12/07/2022   HTN (hypertension) 12/07/2022   Severe sepsis ([place]) 12/07/2022   Generalized anxiety disorder 10/05/2022   Pain management contract signed 05/07/2022   Spinal cord stimulator status [employer], implanted 2017) 04/28/2022   History of lumbar fusion (L5-S1 PSIF) 04/28/2022   Encounter for long-term opiate analgesic use 04/28/2022   Chronic pain syndrome 04/28/2022   High serum parathyroid hormone (PTH) 02/25/2022   Vitamin A  deficiency 02/25/2022   Vitamin D deficiency 02/25/2022   Exocrine pancreatic insufficiency 02/19/2022   Degenerative spondylolisthesis 12/19/2021   Chronic GERD 03/03/2021   Hypokalemia 03/03/2021   Status post hysterectomy 11/05/2020   Bariatric surgery status 01/01/2020   Primary insomnia 12/28/2019   Failed back surgical syndrome 11/27/2019   Mild peripheral edema 11/15/2017   Perforated ear drum, left 06/25/2017   History of pulmonary embolism 06/10/2017   Environmental and seasonal allergies 04/07/2017   Tinea versicolor 02/03/2017   Vasomotor symptoms due to menopause 11/11/2016   Glaucoma 09/28/2016   B12 nutritional deficiency  08/05/2016   OSA on CPAP 03/04/2016   Chronic depression 12/09/2015   Anxiety 12/09/2015   Chronic radicular lumbar pain 08/14/2015   Status post gastric bypass for obesity 07/04/2015   Prediabetes 07/01/2015   Spinal stenosis of lumbar region 12/10/2014   Headache, migraine 04/04/2012    Allergies  Allergen Reactions   Morphine And Related Hives, Shortness Of Breath, Nausea And Vomiting and Other (See Comments)    "can't breathe"   Penicillins Anaphylaxis, Hives, Shortness Of Breath and Nausea And Vomiting    "can't breathe", Has patient had a PCN reaction causing immediate rash, facial/tongue/throat swelling, SOB or lightheadedness with hypotension: Yes Has patient had a PCN reaction causing severe rash involving mucus membranes or skin necrosis: No Has patient had a PCN reaction that required hospitalization No Has patient had a PCN reaction occurring within the last 10 years: No If all of the above answers are "NO", then may proceed with Cephalosporin use.    Latex Hives   Baclofen Nausea Only    Jittery, anxious    Past Surgical History:  Procedure Laterality Date   ABDOMINAL HYSTERECTOMY  10/2006   BACK SURGERY  2014   lumbar fusion   BACK SURGERY  2009   discectomy   BARIATRIC SURGERY  07/2020   Revision   [place]   CHOLECYSTECTOMY  01/2021   COLONOSCOPY WITH PROPOFOL N/A 04/12/2018   Procedure: COLONOSCOPY WITH PROPOFOL;  Surgeon: Lin Landsman, MD;  Location: [place];  Service: Gastroenterology;  Laterality: N/A;   GASTRIC BYPASS  2005   KNEE ARTHROSCOPY WITH LATERAL MENISECTOMY Left 05/27/2017   Procedure: KNEE ARTHROSCOPY WITH LATERAL MENISECTOMY;  Surgeon: Hessie Knows, MD;  Location: [facility];  Service: Orthopedics;  Laterality: Left;   KNEE ARTHROSCOPY WITH LATERAL RELEASE Left 05/27/2017   Procedure: KNEE ARTHROSCOPY WITH LATERAL RELEASE;  Surgeon: Hessie Knows, MD;  Location: [facility];  Service: Orthopedics;  Laterality:  Left;   LAPAROSCOPIC SALPINGO OOPHERECTOMY Bilateral 10/05/2016   Procedure: LAPAROSCOPIC SALPINGO OOPHORECTOMY;  Surgeon: Brayton Mars, MD;  Location: [facility];  Service: Gynecology;  Laterality: Bilateral;   REDUCTION MAMMAPLASTY Bilateral    2001   SPINAL CORD STIMULATOR INSERTION N/A 03/20/2016   Procedure: LUMBAR SPINAL CORD STIMULATOR INSERTION;  Surgeon: Eustace Moore, MD;  Location: [place] [facility];  Service: Neurosurgery;  Laterality: N/A;   SPINE SURGERY  2009 2014 2017   TONSILLECTOMY      Social History   Tobacco Use   Smoking status: Never   Smokeless tobacco: Never   Tobacco comments:    smoking cessation materials not required  Vaping Use   Vaping Use: Never used  Substance Use Topics   Alcohol use: No   Drug use: No     Medication list has been reviewed and updated.  Current Meds  Medication Sig   cyanocobalamin (VITAMIN B12) 1000 MCG/ML injection  ADMINISTER 1 ML(1000 MCG) IN THE MUSCLE EVERY 30 DAYS   cyclobenzaprine (FLEXERIL) 10 MG tablet Take 10 mg by mouth daily as needed for muscle spasms.   dextromethorphan-guaiFENesin (MUCINEX DM) 30-600 MG 12hr tablet Take 1 tablet by mouth 2 (two) times daily for 5 days.   fluticasone (FLONASE) 50 MCG/ACT nasal spray Place 1 spray into both nostrils daily as needed for allergies.   furosemide (LASIX) 20 MG tablet TAKE 1 TABLET(20 MG) BY MOUTH DAILY   gabapentin (NEURONTIN) 300 MG capsule Take 2 capsules (600 mg total) by mouth at bedtime.   latanoprost (XALATAN) 0.005 % ophthalmic solution Place 1 drop into both eyes at bedtime.    Multiple Vitamins-Minerals (MULTIVITAMIN GUMMIES ADULTS PO) Take 1 tablet by mouth daily.   pantoprazole (PROTONIX) 40 MG tablet Take 40 mg by mouth 2 (two) times daily.   potassium chloride (KLOR-CON) 10 MEQ tablet Take 10 mEq by mouth 2 (two) times daily.   rizatriptan (MAXALT-MLT) 10 MG disintegrating tablet DISSOLVE 1 TABLET BY MOUTH AS NEEDED FOR MIGRAINE- MAY REPEAT IN 2 HOURS AS  NEEDED   sertraline (ZOLOFT) 100 MG tablet Take 1 tablet (100 mg total) by mouth daily.   temazepam (RESTORIL) 15 MG capsule Take 1 capsule (15 mg total) by mouth at bedtime as needed for sleep.       11/06/2022    3:26 PM 10/05/2022    8:07 AM 10/24/2021    1:35 PM 10/02/2021    8:02 AM  GAD 7 : Generalized Anxiety Score  Nervous, Anxious, on Edge 2 2 1 1   Control/stop worrying 1 2 0 1  Worry too much - different things 2 2 1 1   Trouble relaxing 3 2 3 1   Restless 2 2 0 1  Easily annoyed or irritable 1 2 1 1   Afraid - awful might happen 2 3 1 1   Total GAD 7 Score 13 15 7 7   Anxiety Difficulty Somewhat difficult Very difficult  Not difficult at all       11/25/2022    3:31 PM 11/06/2022    3:26 PM 10/05/2022    8:07 AM  Depression screen PHQ 2/9  Decreased Interest 0 0 0  Down, Depressed, Hopeless 0 1 3  PHQ - 2 Score 0 1 3  Altered sleeping 0 3 1  Tired, decreased energy 0 1 1  Change in appetite 0 0 0  Feeling bad or failure about yourself  0 0 2  Trouble concentrating 0 0 1  Moving slowly or fidgety/restless 0 0 0  Suicidal thoughts 0 0 0  PHQ-9 Score 0 5 8  Difficult doing work/chores Not difficult at all Somewhat difficult Somewhat difficult    BP Readings from Last 3 Encounters:  12/14/22 118/82  12/10/22 129/80  11/06/22 114/64    Physical Exam Constitutional:      Appearance: Normal appearance.  Cardiovascular:     Rate and Rhythm: Normal rate and regular rhythm.  Pulmonary:     Effort: Pulmonary effort is normal.     Breath sounds: Rales present. No wheezing or rhonchi.     Comments: Mild coarse crackles appreciated posterior left mid-lower lung field.    Wt Readings from Last 3 Encounters:  12/14/22 207 lb (93.9 kg)  12/07/22 205 lb (93 kg)  11/25/22 213 lb (96.6 kg)    BP 118/82 (BP Location: Left Arm, Patient Position: Sitting, Cuff Size: Large)   Pulse 75   Temp 98.5 F (36.9 C) (Oral)  Ht 5\' 7"  (1.702 m)   Wt 207 lb (93.9 kg)   SpO2 92%    BMI 32.42 kg/m   Assessment and Plan:  1. Community acquired pneumonia of left lung, unspecified part of lung Seems to be resolving, patient continues to improve symptomatically.  Discussed with patient that I still hear crackles in the left lung which I suspect to be residual and should resolve with time.  Without fever or concerning symptoms, I do not see a need for additional antibiotics at this time.  Advised to continue incentive spirometry and gradually increase daily activity back to baseline as tolerated.  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  Return if symptoms worsen or fail to improve.   Partially dictated using Editor, commissioning. Any errors are unintentional.  Lupita Leash, PA-C, [facility]

## 2022-12-18 ENCOUNTER — Inpatient Hospital Stay: Payer: 59 | Admitting: Internal Medicine

## 2022-12-22 ENCOUNTER — Other Ambulatory Visit: Payer: Self-pay | Admitting: Internal Medicine

## 2022-12-22 DIAGNOSIS — G43909 Migraine, unspecified, not intractable, without status migrainosus: Secondary | ICD-10-CM

## 2022-12-22 NOTE — Telephone Encounter (Signed)
Requested Prescriptions  Pending Prescriptions Disp Refills   rizatriptan (MAXALT-MLT) 10 MG disintegrating tablet [Pharmacy Med Name: RIZATRIPTAN ODT 10MG  TABLETS] 10 tablet 0    Sig: DISSOLVE 1 TABLET BY MOUTH AS NEEDED FOR MIGRAINE. MAY REPEAT IN 2 HOURS AS NEEDED     Neurology:  Migraine Therapy - Triptan Passed - 12/22/2022  7:40 AM      Passed - Last BP in normal range    BP Readings from Last 1 Encounters:  12/14/22 118/82         Passed - Valid encounter within last 12 months    Recent Outpatient Visits           1 week ago Community acquired pneumonia of left lung, unspecified part of lung   [facility] [place], Melene Plan, PA   1 month ago Generalized anxiety disorder   [facility], Jesse Sans, MD   2 months ago Annual physical exam   [place] [facility], Jesse Sans, MD   2 months ago Influenza A   [facility] at [facility], Jesse Sans, MD   1 year ago Diarrhea of presumed infectious origin   [place] [facility], Jesse Sans, MD

## 2022-12-24 ENCOUNTER — Ambulatory Visit
Payer: BC Managed Care – PPO | Attending: Student in an Organized Health Care Education/Training Program | Admitting: Student in an Organized Health Care Education/Training Program

## 2022-12-24 ENCOUNTER — Encounter: Payer: Self-pay | Admitting: Student in an Organized Health Care Education/Training Program

## 2022-12-24 VITALS — BP 114/67 | HR 70 | Temp 97.0°F | Ht 67.0 in | Wt 204.0 lb

## 2022-12-24 DIAGNOSIS — M5416 Radiculopathy, lumbar region: Secondary | ICD-10-CM | POA: Diagnosis not present

## 2022-12-24 DIAGNOSIS — Z9689 Presence of other specified functional implants: Secondary | ICD-10-CM | POA: Diagnosis not present

## 2022-12-24 DIAGNOSIS — Z79891 Long term (current) use of opiate analgesic: Secondary | ICD-10-CM | POA: Diagnosis not present

## 2022-12-24 DIAGNOSIS — M961 Postlaminectomy syndrome, not elsewhere classified: Secondary | ICD-10-CM | POA: Insufficient documentation

## 2022-12-24 DIAGNOSIS — Z0289 Encounter for other administrative examinations: Secondary | ICD-10-CM | POA: Diagnosis not present

## 2022-12-24 DIAGNOSIS — Z981 Arthrodesis status: Secondary | ICD-10-CM | POA: Diagnosis not present

## 2022-12-24 DIAGNOSIS — G8929 Other chronic pain: Secondary | ICD-10-CM | POA: Diagnosis not present

## 2022-12-24 MED ORDER — HYDROCODONE-ACETAMINOPHEN 7.5-325 MG PO TABS: 1.0000 | ORAL_TABLET | Freq: Every evening | ORAL | 0 refills | Status: DC | PRN

## 2022-12-24 MED ORDER — HYDROCODONE-ACETAMINOPHEN 7.5-325 MG PO TABS: 1.0000 | ORAL_TABLET | Freq: Every evening | ORAL | 0 refills | Status: AC | PRN

## 2022-12-24 NOTE — Progress Notes (Signed)
PROVIDER NOTE: Information contained herein reflects review and annotations entered in association with encounter. Interpretation of such information and data should be left to medically-trained personnel. Information provided to patient can be located elsewhere in the medical record under "Patient Instructions". Document created using STT-dictation technology, any transcriptional errors that may result from process are unintentional.    Patient: Kimberly Cowan  Service Category: E/M  Provider: Gillis Santa, MD  DOB: [birth year]  DOS: 12/24/2022  Referring Provider: Glean Hess, MD  MRN: [id number]  Specialty: Interventional Pain Management  PCP: Glean Hess, MD  Type: Established Patient  Setting: Ambulatory outpatient    Location: Office  Delivery: Face-to-face     HPI  Kimberly Cowan, a 57 y.o. 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 Ms. Grubb primary complain today is Back Pain (lower) Last encounter: My last encounter with her was on 10/01/22 Pertinent problems: Ms. Malony has Failed back surgical syndrome; Spinal stenosis of lumbar region; Chronic depression; Spinal cord stimulator status [employer], implanted 2017); History of lumbar fusion (L5-S1 PSIF); Encounter for long-term opiate analgesic use; Chronic pain syndrome; and Pain management contract signed on their pertinent problem list. Pain Assessment: Severity of Chronic pain is reported as a 7 /10. Location: Back Lower/radiates down legs to toes. Onset: More than a month ago. Quality: Constant, Shooting, Stabbing. Timing: Constant. Modifying factor(s): meds, laying down. Vitals:  height is 5\' 7"  (1.702 m) and weight is 204 lb (92.5 kg). Her temporal temperature is 97 F (36.1 C) (abnormal). Her blood pressure is 114/67 and her pulse is 70. Her oxygen saturation is 100%.   Reason for encounter: medication management.   Was recently  hospitalized for PNA- IV abx and IV fluids. Was on oxygen at the hospital at 4L, weaned down at discharge. Patient's pain is at baseline.  Patient continues multimodal pain regimen as prescribed.  States that it provides pain relief and improvement in functional status.  05/07/22: Second patient visit today.  She has completed her urine toxicology screen which is appropriate.  I will have her sign pain contract and take her on for chronic pain management.  I will send in a prescription for hydrocodone 7.5 mg nightly.  I will see her back in 4 weeks for medication management.   Please see initial clinic visit HPI below Kimberly Cowan is a pleasant 57 year old female with a history of L4-S1 spinal fusion with [employer] spinal cord stimulator in place who is hoping to establish with pain management as her previous pain provider left [place] [facility] and is no longer excepting new patients.  She states that she has done physical therapy in the past tried various medications as well as injections with limited response.  She had her spinal cord stimulator implanted in 2017 with [place] which was beneficial for her and allowed her to wean her hydrocodone to just once at night.  She states that she is able to manage with 7.5 mg nightly.  She is hoping to have this continued.  She also has restless leg syndrome and obesity.  She also has obstructive sleep apnea.  Given that she is on low-dose and has been compliant with previous pain clinic protocol, I will have her obtain baseline urine toxicology screen which is customary for new patients and I will see her back in approximately 7 to 10 days to sign pain contract and take her on for medication   Pharmacotherapy Assessment  Analgesic:  Hydrocodone 7.5 mg nightly as needed  Monitoring: [place] PMP: PDMP reviewed during this encounter.       Pharmacotherapy: No side-effects or adverse reactions reported. Compliance: No problems identified. Effectiveness:  Clinically acceptable.  Arlice Colt, RN  12/24/2022  1:49 PM  Sign when Signing Visit Nursing Pain Medication Assessment:  Safety precautions to be maintained throughout the outpatient stay will include: orient to surroundings, keep bed in low position, maintain call bell within reach at all times, provide assistance with transfer out of bed and ambulation.  Medication Inspection Compliance: Pill count conducted under aseptic conditions, in front of the patient. Neither the pills nor the bottle was removed from the patient's sight at any time. Once count was completed pills were immediately returned to the patient in their original bottle.  Medication: Oxycodone/APAP Pill/Patch Count:  30 of 30 pills remain Pill/Patch Appearance: Markings consistent with prescribed medication Bottle Appearance: Standard pharmacy container. Clearly labeled. Filled Date: 03 / 26 / 2024 Last Medication intake:  YesterdaySafety precautions to be maintained throughout the outpatient stay will include: orient to surroundings, keep bed in low position, maintain call bell within reach at all times, provide assistance with transfer out of bed and ambulation.     No results found for: "CBDTHCR" No results found for: "D8THCCBX" No results found for: "D9THCCBX"  UDS:  Summary  Date Value Ref Range Status  04/28/2022 Note  Final    Comment:    ==================================================================== Compliance Drug Analysis, Ur ==================================================================== Test                             Result       Flag       Units  Drug Present and Declared for Prescription Verification   Oxazepam                       641          EXPECTED   ng/mg creat   Temazepam                      >2532        EXPECTED   ng/mg creat    Oxazepam and temazepam are expected metabolites of diazepam.    Oxazepam is also an expected metabolite of other benzodiazepine    drugs, including  chlordiazepoxide, prazepam, clorazepate, halazepam,    and temazepam.  Oxazepam and temazepam are available as scheduled    prescription medications.    Gabapentin                     PRESENT      EXPECTED   Cyclobenzaprine                PRESENT      EXPECTED   Desmethylcyclobenzaprine       PRESENT      EXPECTED    Desmethylcyclobenzaprine is an expected metabolite of    cyclobenzaprine.    Acetaminophen                  PRESENT      EXPECTED  Drug Absent but Declared for Prescription Verification   Hydrocodone                    Not Detected UNEXPECTED ng/mg creat   Methocarbamol  Not Detected UNEXPECTED ==================================================================== Test                      Result    Flag   Units      Ref Range   Creatinine              79               mg/dL      >=20 ==================================================================== Declared Medications:  The flagging and interpretation on this report are based on the  following declared medications.  Unexpected results may arise from  inaccuracies in the declared medications.   **Note: The testing scope of this panel includes these medications:   Cyclobenzaprine (Flexeril)  Gabapentin (Neurontin)  Hydrocodone (Norco)  Methocarbamol (Robaxin)  Temazepam (Restoril)   **Note: The testing scope of this panel does not include small to  moderate amounts of these reported medications:   Acetaminophen (Norco)   **Note: The testing scope of this panel does not include the  following reported medications:   Fluticasone (Flonase)  Furosemide (Lasix)  Ketoconazole (Nizoral)  Latanoprost (Xalatan)  Levocetirizine (Xyzal)  Multivitamin  Pantoprazole (Protonix)  Rizatriptan (Maxalt)  Scopolamine  Vitamin B12 ==================================================================== For clinical consultation, please call [phone]. ====================================================================       ROS  Constitutional: Denies any fever or chills Gastrointestinal: No reported hemesis, hematochezia, vomiting, or acute GI distress Musculoskeletal:  + LBP Neurological: No reported episodes of acute onset apraxia, aphasia, dysarthria, agnosia, amnesia, paralysis, loss of coordination, or loss of consciousness  Medication Review  HYDROcodone-acetaminophen, Multiple Vitamins-Minerals, cyanocobalamin, cyclobenzaprine, fluticasone, furosemide, gabapentin, latanoprost, pantoprazole, potassium chloride, rizatriptan, sertraline, and temazepam  History Review  Allergy: Ms. Swofford is allergic to morphine and related, penicillins, latex, and baclofen. Drug: Ms. Kast  reports no history of drug use. Alcohol:  reports no history of alcohol use. Tobacco:  reports that she has never smoked. She has never used smokeless tobacco. Social: Ms. Tilford  reports that she has never smoked. She has never used smokeless tobacco. She reports that she does not drink alcohol and does not use drugs. Medical:  has a past medical history of Allergy (1999), Anemia, Anxiety, Arm pain, left (05/17/2018), Arthritis, Bilateral lumbar radiculopathy, Cellulitis of abdominal wall (03/03/2021), Chronic back pain, Chronic GERD (03/03/2021), Clinical depression (04/04/2012), Depression, DVT (deep venous thrombosis) ([place]), Encounter for screening colonoscopy, Essential (primary) hypertension (06/04/2020), Glaucoma, Headache(784.0), Major depressive disorder with single episode, in full remission ([place]) (08/15/2018), Malfunction of jejunostomy tube ([place]) (02/13/2021), Ovarian cyst, Pneumonia, Pulmonary embolism ([place]) (05/2017), S/P laparoscopic cholecystectomy (02/07/2021), and Sleep apnea. Surgical: Ms. Hollimon  has a past surgical history that includes Tonsillectomy; Gastric bypass (2005); Reduction mammaplasty (Bilateral); Spinal  cord stimulator insertion (N/A, 03/20/2016); Abdominal hysterectomy (10/2006); Laparoscopic salpingo oophorectomy (Bilateral, 10/05/2016); Back surgery (2014); Back surgery (2009); Colonoscopy with propofol (N/A, 04/12/2018); Cesarean section (1989 1995); Spine surgery (2009 2014 2017); Knee arthroscopy with lateral menisectomy (Left, 05/27/2017); Knee arthroscopy with lateral release (Left, 05/27/2017); Bariatric Surgery (07/2020); and Cholecystectomy (01/2021). Family: family history includes Arthritis in her father, mother, sister, and sister; Cancer in her father and maternal aunt; Diabetes in her maternal aunt, maternal grandmother, mother, and sister; Hypertension in her father, mother, and sister; Obesity in her daughter, father, mother, sister, and sister; Varicose Veins in her mother.  Laboratory Chemistry Profile   Renal Lab Results  Component Value Date   BUN 8 12/10/2022   CREATININE 0.57 12/10/2022   BCR 17 10/08/2022   GFRAA  100 07/24/2020   GFRNONAA >60 12/10/2022    Hepatic Lab Results  Component Value Date   AST 15 12/07/2022   ALT 15 12/07/2022   ALBUMIN 2.9 (L) 12/07/2022   ALKPHOS 61 12/07/2022   LIPASE 25 12/07/2022    Electrolytes Lab Results  Component Value Date   NA 139 12/10/2022   K 3.6 12/10/2022   CL 110 12/10/2022   CALCIUM 9.0 12/10/2022   MG 2.0 12/07/2022   PHOS 2.8 12/08/2022    Bone Lab Results  Component Value Date   VD25OH 9 02/19/2022    Inflammation (CRP: Acute Phase) (ESR: Chronic Phase) Lab Results  Component Value Date   LATICACIDVEN 1.6 12/08/2022         Note: Above Lab results reviewed.  Recent Imaging Review  US Venous Img Lower Bilateral (DVT) CLINICAL DATA:  Bilateral lower extremity pain and edema. History of previous DVT affecting the left lower extremity. Evaluate for acute or chronic DVT.  EXAM: BILATERAL LOWER EXTREMITY VENOUS DOPPLER ULTRASOUND  TECHNIQUE: Gray-scale sonography with graded compression, as  well as color Doppler and duplex ultrasound were performed to evaluate the lower extremity deep venous systems from the level of the common femoral vein and including the common femoral, femoral, profunda femoral, popliteal and calf veins including the posterior tibial, peroneal and gastrocnemius veins when visible. The superficial great saphenous vein was also interrogated. Spectral Doppler was utilized to evaluate flow at rest and with distal augmentation maneuvers in the common femoral, femoral and popliteal veins.  COMPARISON:  Bilateral lower extremity venous Doppler ultrasound-10/10/2018 (negative).  FINDINGS: RIGHT LOWER EXTREMITY  Common Femoral Vein: No evidence of thrombus. Normal compressibility, respiratory phasicity and response to augmentation.  Saphenofemoral Junction: No evidence of thrombus. Normal compressibility and flow on color Doppler imaging.  Profunda Femoral Vein: No evidence of thrombus. Normal compressibility and flow on color Doppler imaging.  Femoral Vein: No evidence of thrombus. Normal compressibility, respiratory phasicity and response to augmentation.  Popliteal Vein: No evidence of thrombus. Normal compressibility, respiratory phasicity and response to augmentation.  Calf Veins: No evidence of thrombus. Normal compressibility and flow on color Doppler imaging.  Superficial Great Saphenous Vein: No evidence of thrombus. Normal compressibility.  Other Findings:  None.  LEFT LOWER EXTREMITY  Common Femoral Vein: No evidence of thrombus. Normal compressibility, respiratory phasicity and response to augmentation.  Saphenofemoral Junction: No evidence of thrombus. Normal compressibility and flow on color Doppler imaging.  Profunda Femoral Vein: No evidence of thrombus. Normal compressibility and flow on color Doppler imaging.  Femoral Vein: No evidence of thrombus. Normal compressibility, respiratory phasicity and response to  augmentation.  Popliteal Vein: No evidence of thrombus. Normal compressibility, respiratory phasicity and response to augmentation.  Calf Veins: No evidence of thrombus. Normal compressibility and flow on color Doppler imaging.  Superficial Great Saphenous Vein: No evidence of thrombus. Normal compressibility.  Other Findings:  None.  IMPRESSION: No evidence of acute or chronic DVT within either lower extremity.  Electronically Signed   By: Sandi Mariscal M.D.   On: 12/07/2022 12:35 CT Angio Chest Pulmonary Embolism (PE) W or WO Contrast CLINICAL DATA:  Shortness of breath.  Cough.  EXAM: CT ANGIOGRAPHY CHEST WITH CONTRAST  TECHNIQUE: Multidetector CT imaging of the chest was performed using the standard protocol during bolus administration of intravenous contrast. Multiplanar CT image reconstructions and MIPs were obtained to evaluate the vascular anatomy.  RADIATION DOSE REDUCTION: This exam was performed according to the departmental dose-optimization program which includes automated  exposure control, adjustment of the mA and/or kV according to patient size and/or use of iterative reconstruction technique.  CONTRAST:  62mL OMNIPAQUE IOHEXOL 350 MG/ML SOLN  COMPARISON:  Plain film of earlier today.  CTA chest 03/03/2021  FINDINGS: Cardiovascular: The quality of this exam for evaluation of pulmonary embolism is poor. Limitations include suboptimal bolus timing, with contrast centered in the SVC, patient body habitus, and arm position, not raised above the head. There is also mild motion.  No pulmonary embolism to the lobar level.  Bovine arch.  Mild cardiomegaly, without pericardial effusion.  Mediastinum/Nodes: No mediastinal or hilar adenopathy.  The esophagus is dilated and fluid-filled including on 60/4.  Lungs/Pleura: No pleural fluid. Diffuse airspace disease/consolidation, greater left than right with sparing of the right upper lobe. No extra alveolar  air.  Upper Abdomen: Degradation continuing into the upper abdomen. Status post Roux-en-Y gastric bypass. Normal imaged portions of the liver, spleen, pancreas, right adrenal gland, kidneys. Mild left adrenal thickening.  Musculoskeletal: Dorsal spinal stimulator.  Review of the MIP images confirms the above findings.  IMPRESSION: 1. Poor quality evaluation for pulmonary embolism. No lobar embolism identified. 2. Left-greater-than-right airspace disease/consolidation, consistent with pneumonia or aspiration. 3. Esophageal air fluid level suggests dysmotility or gastroesophageal reflux.  Electronically Signed   By: Abigail Miyamoto M.D.   On: 12/07/2022 10:20 DG Chest Portable 1 View CLINICAL DATA:  Shortness of breath, cough  EXAM: PORTABLE CHEST 1 VIEW  COMPARISON:  03/03/2021  FINDINGS: Left mid lung and bibasilar airspace opacities compatible with pneumonia, worse in the left lung. Right hemidiaphragm is elevated as before. Negative for edema, large effusion or pneumothorax. Stable heart size. Thoracic stimulator noted. Trachea midline.  IMPRESSION: Left mid lung and bibasilar airspace opacities compatible with pneumonia.  Electronically Signed   By: Jerilynn Mages.  Shick M.D.   On: 12/07/2022 08:36 Note: Reviewed        Physical Exam  General appearance: Well nourished, well developed, and well hydrated. In no apparent acute distress Mental status: Alert, oriented x 3 (person, place, & time)       Respiratory: No evidence of acute respiratory distress Eyes: PERLA Vitals: BP 114/67 (BP Location: Right Arm, Patient Position: Sitting, Cuff Size: Large) Comment (BP Location): lower arm  Pulse 70   Temp (!) 97 F (36.1 C) (Temporal)   Ht 5\' 7"  (1.702 m)   Wt 204 lb (92.5 kg)   SpO2 100%   BMI 31.95 kg/m  BMI: Estimated body mass index is 31.95 kg/m as calculated from the following:   Height as of this encounter: 5\' 7"  (1.702 m).   Weight as of this encounter: 204 lb (92.5  kg). Ideal: Ideal body weight: 61.6 kg (135 lb 12.9 oz) Adjusted ideal body weight: 74 kg (163 lb 1.3 oz)    Thoracic Spine Area Exam  Skin & Axial Inspection: Well healed scar from previous spine surgery detected Alignment: Symmetrical Functional ROM: Pain restricted ROM Stability: No instability detected Muscle Tone/Strength: Functionally intact. No obvious neuro-muscular anomalies detected. Sensory (Neurological): Neurogenic pain pattern Muscle strength & Tone: No palpable anomalies Lumbar Spine Area Exam  Skin & Axial Inspection: Well healed scar from previous spine surgery detected spinal cord stimulator IPG  along the left flank Alignment: Symmetrical Functional ROM: Pain restricted ROM       Stability: No instability detected Muscle Tone/Strength: Functionally intact. No obvious neuro-muscular anomalies detected. Sensory (Neurological): Dermatomal pain pattern   Gait & Posture Assessment  Ambulation: Limited Gait: Antalgic gait (  limping) Posture: Difficulty standing up straight, due to pain  Lower Extremity Exam      Side: Right lower extremity   Side: Left lower extremity  Stability: No instability observed           Stability: No instability observed          Skin & Extremity Inspection: Skin color, temperature, and hair growth are WNL. No peripheral edema or cyanosis. No masses, redness, swelling, asymmetry, or associated skin lesions. No contractures.   Skin & Extremity Inspection: Skin color, temperature, and hair growth are WNL. No peripheral edema or cyanosis. No masses, redness, swelling, asymmetry, or associated skin lesions. No contractures.  Functional ROM: Unrestricted ROM                   Functional ROM: Unrestricted ROM                  Muscle Tone/Strength: Functionally intact. No obvious neuro-muscular anomalies detected.   Muscle Tone/Strength: Functionally intact. No obvious neuro-muscular anomalies detected.  Sensory (Neurological): Musculoskeletal pain  pattern         Sensory (Neurological): Musculoskeletal pain pattern        DTR: Patellar: deferred today Achilles: deferred today Plantar: deferred today   DTR: Patellar: deferred today Achilles: deferred today Plantar: deferred today  Palpation: No palpable anomalies   Palpation: No palpable anomalies       Assessment   Diagnosis Status  1. Failed back surgical syndrome   2. Spinal cord stimulator status [employer], implanted 2017)   3. Chronic radicular lumbar pain   4. Pain management contract signed   5. Encounter for long-term opiate analgesic use   6. History of lumbar fusion (L5-S1 PSIF)     Controlled Controlled Controlled     Plan of Care    Ms. Katielynn Evette Cowan has a current medication list which includes the following long-term medication(s): fluticasone, furosemide, gabapentin, rizatriptan, sertraline, and temazepam.  Pharmacotherapy (Medications Ordered): Meds ordered this encounter  Medications   HYDROcodone-acetaminophen (NORCO) 7.5-325 MG tablet    Sig: Take 1 tablet by mouth at bedtime as needed for moderate pain.    Dispense:  30 tablet    Refill:  0   HYDROcodone-acetaminophen (NORCO) 7.5-325 MG tablet    Sig: Take 1 tablet by mouth at bedtime as needed for moderate pain.    Dispense:  30 tablet    Refill:  0   HYDROcodone-acetaminophen (NORCO) 7.5-325 MG tablet    Sig: Take 1 tablet by mouth at bedtime as needed for moderate pain.    Dispense:  30 tablet    Refill:  0   Continue Gabapentin 600 mg qhs  Follow-up plan:   Return in about 4 months (around 04/20/2023) for Medication Management, in person.    I discussed the assessment and treatment plan with the patient. The patient was provided an opportunity to ask questions and all were answered. The patient agreed with the plan and demonstrated an understanding of the instructions.  Patient advised to call back or seek an in-person evaluation if the symptoms or condition  worsens.  Duration of encounter: 45minutes.  Total time on encounter, as per AMA guidelines included both the face-to-face and non-face-to-face time personally spent by the physician and/or other qualified health care professional(s) on the day of the encounter (includes time in activities that require the physician or other qualified health care professional and does not include time in activities normally performed by clinical staff).  Physician's time may include the following activities when performed: preparing to see the patient (eg, review of tests, pre-charting review of records) obtaining and/or reviewing separately obtained history performing a medically appropriate examination and/or evaluation counseling and educating the patient/family/caregiver ordering medications, tests, or procedures referring and communicating with other health care professionals (when not separately reported) documenting clinical information in the electronic or other health record independently interpreting results (not separately reported) and communicating results to the patient/ family/caregiver care coordination (not separately reported)  Note by: Gillis Santa, MD Date: 12/24/2022; Time: 2:15 PM

## 2022-12-24 NOTE — Progress Notes (Signed)
Nursing Pain Medication Assessment:  Safety precautions to be maintained throughout the outpatient stay will include: orient to surroundings, keep bed in low position, maintain call bell within reach at all times, provide assistance with transfer out of bed and ambulation.  Medication Inspection Compliance: Pill count conducted under aseptic conditions, in front of the patient. Neither the pills nor the bottle was removed from the patient's sight at any time. Once count was completed pills were immediately returned to the patient in their original bottle.  Medication: Oxycodone/APAP Pill/Patch Count:  30 of 30 pills remain Pill/Patch Appearance: Markings consistent with prescribed medication Bottle Appearance: Standard pharmacy container. Clearly labeled. Filled Date: 03 / 26 / 2024 Last Medication intake:  YesterdaySafety precautions to be maintained throughout the outpatient stay will include: orient to surroundings, keep bed in low position, maintain call bell within reach at all times, provide assistance with transfer out of bed and ambulation.

## 2022-12-25 ENCOUNTER — Telehealth: Payer: Self-pay

## 2022-12-25 NOTE — Telephone Encounter (Signed)
Kimberly Cowan from [place] says he is going to send a fax with additional questions, fax confirmed

## 2022-12-25 NOTE — Telephone Encounter (Signed)
Completed PA on [email or web address] for Rizatriptan.  ([employer]) PA Case ID #[id number]  Awaiting outcome.

## 2022-12-28 ENCOUNTER — Telehealth: Payer: Self-pay | Admitting: Internal Medicine

## 2022-12-28 NOTE — Telephone Encounter (Signed)
Will finish additional questions for rizatriptan today.  Ania Levay

## 2022-12-28 NOTE — Telephone Encounter (Signed)
Received fax - will complete today and fax back.  Kimberly Cowan

## 2022-12-28 NOTE — Telephone Encounter (Signed)
Alexa with [facility] has called for an update on the status of a previously requested prior authorization and request for additional clinical information  Ref # [id number]  Please contact further when possible

## 2022-12-28 NOTE — Telephone Encounter (Signed)
Completed paperwork/additional questions and faxed back to pts insurance.  Kimberly Cowan

## 2022-12-28 NOTE — Telephone Encounter (Signed)
Waiting for FAX.  Kimberly Cowan

## 2022-12-28 NOTE — Telephone Encounter (Signed)
Copied from [place] [phone]. Topic: General - Other >> Dec 28, 2022  1:24 PM Ja-Kwan M wrote: Reason for CRM: Anderson Malta with [facility] reports that prior authorization is needed for rizatriptan (MAXALT-MLT) 10 MG disintegrating tablet. Cb# [phone]

## 2022-12-31 DIAGNOSIS — J9601 Acute respiratory failure with hypoxia: Secondary | ICD-10-CM

## 2022-12-31 HISTORY — DX: Acute respiratory failure with hypoxia: J96.01

## 2023-01-22 ENCOUNTER — Ambulatory Visit
Admission: RE | Admit: 2023-01-22 | Discharge: 2023-01-22 | Disposition: A | Discharge status: Home / Self Care | Payer: BC Managed Care – PPO | Source: Ambulatory Visit | Attending: Internal Medicine | Admitting: Internal Medicine

## 2023-01-22 DIAGNOSIS — Z1231 Encounter for screening mammogram for malignant neoplasm of breast: Secondary | ICD-10-CM | POA: Diagnosis present

## 2023-01-26 DIAGNOSIS — E559 Vitamin D deficiency, unspecified: Secondary | ICD-10-CM | POA: Diagnosis not present

## 2023-01-26 DIAGNOSIS — E509 Vitamin A deficiency, unspecified: Secondary | ICD-10-CM | POA: Diagnosis not present

## 2023-01-26 DIAGNOSIS — R197 Diarrhea, unspecified: Secondary | ICD-10-CM | POA: Diagnosis not present

## 2023-01-26 DIAGNOSIS — Z9884 Bariatric surgery status: Secondary | ICD-10-CM | POA: Diagnosis not present

## 2023-01-26 DIAGNOSIS — K909 Intestinal malabsorption, unspecified: Secondary | ICD-10-CM | POA: Diagnosis not present

## 2023-02-15 ENCOUNTER — Telehealth: Payer: Self-pay | Admitting: Student in an Organized Health Care Education/Training Program

## 2023-02-15 ENCOUNTER — Other Ambulatory Visit: Payer: Self-pay | Admitting: *Deleted

## 2023-02-15 ENCOUNTER — Other Ambulatory Visit: Payer: Self-pay | Admitting: Internal Medicine

## 2023-02-15 DIAGNOSIS — E538 Deficiency of other specified B group vitamins: Secondary | ICD-10-CM

## 2023-02-15 MED ORDER — HYDROCODONE-ACETAMINOPHEN 7.5-325 MG PO TABS: 1.0000 | ORAL_TABLET | Freq: Every evening | ORAL | 0 refills | Status: AC | PRN

## 2023-02-15 MED ORDER — HYDROCODONE-ACETAMINOPHEN 7.5-325 MG PO TABS: 1.0000 | ORAL_TABLET | Freq: Every evening | ORAL | 0 refills | Status: DC | PRN

## 2023-02-15 NOTE — Telephone Encounter (Signed)
Patient notified

## 2023-02-15 NOTE — Telephone Encounter (Signed)
[facility] she currently uses is closing. Will need new scripts sent to [facility] in [place]. Please notidy patient when this is completed. Thanks.

## 2023-02-15 NOTE — Telephone Encounter (Signed)
Rx request sent to Dr. Lateef 

## 2023-03-02 ENCOUNTER — Other Ambulatory Visit: Payer: Self-pay | Admitting: Internal Medicine

## 2023-03-02 ENCOUNTER — Encounter: Payer: Self-pay | Admitting: Internal Medicine

## 2023-03-02 DIAGNOSIS — F5101 Primary insomnia: Secondary | ICD-10-CM

## 2023-03-02 MED ORDER — TEMAZEPAM 15 MG PO CAPS: 15.0000 mg | ORAL_CAPSULE | Freq: Every evening | ORAL | 5 refills | Status: DC | PRN

## 2023-04-15 ENCOUNTER — Encounter: Payer: Self-pay | Admitting: Student in an Organized Health Care Education/Training Program

## 2023-04-15 ENCOUNTER — Ambulatory Visit
Payer: BC Managed Care – PPO | Attending: Student in an Organized Health Care Education/Training Program | Admitting: Student in an Organized Health Care Education/Training Program

## 2023-04-15 VITALS — BP 141/94 | HR 78 | Temp 97.2°F | Ht 67.0 in | Wt 203.0 lb

## 2023-04-15 DIAGNOSIS — M5416 Radiculopathy, lumbar region: Secondary | ICD-10-CM | POA: Insufficient documentation

## 2023-04-15 DIAGNOSIS — Z981 Arthrodesis status: Secondary | ICD-10-CM | POA: Diagnosis not present

## 2023-04-15 DIAGNOSIS — Z0289 Encounter for other administrative examinations: Secondary | ICD-10-CM | POA: Insufficient documentation

## 2023-04-15 DIAGNOSIS — G8929 Other chronic pain: Secondary | ICD-10-CM | POA: Diagnosis present

## 2023-04-15 DIAGNOSIS — Z79891 Long term (current) use of opiate analgesic: Secondary | ICD-10-CM | POA: Insufficient documentation

## 2023-04-15 DIAGNOSIS — M961 Postlaminectomy syndrome, not elsewhere classified: Secondary | ICD-10-CM | POA: Diagnosis not present

## 2023-04-15 DIAGNOSIS — Z9689 Presence of other specified functional implants: Secondary | ICD-10-CM | POA: Insufficient documentation

## 2023-04-15 DIAGNOSIS — G894 Chronic pain syndrome: Secondary | ICD-10-CM | POA: Diagnosis not present

## 2023-04-15 MED ORDER — HYDROCODONE-ACETAMINOPHEN 7.5-325 MG PO TABS: 1.0000 | ORAL_TABLET | Freq: Every evening | ORAL | 0 refills | Status: AC | PRN

## 2023-04-15 MED ORDER — HYDROCODONE-ACETAMINOPHEN 7.5-325 MG PO TABS: 1.0000 | ORAL_TABLET | Freq: Every evening | ORAL | 0 refills | Status: DC | PRN

## 2023-04-15 MED ORDER — GABAPENTIN 300 MG PO CAPS: 600.0000 mg | ORAL_CAPSULE | Freq: Every day | ORAL | 5 refills | Status: DC

## 2023-04-15 NOTE — Progress Notes (Signed)
PROVIDER NOTE: Information contained herein reflects review and annotations entered in association with encounter. Interpretation of such information and data should be left to medically-trained personnel. Information provided to patient can be located elsewhere in the medical record under "Patient Instructions". Document created using STT-dictation technology, any transcriptional errors that may result from process are unintentional.    Patient: Kimberly Cowan  Service Category: E/M  Provider: Edward Jolly, MD  DOB: 1966-04-09  DOS: 04/15/2023  Referring Provider: Reubin Milan, MD  MRN: [id number]  Specialty: Interventional Pain Management  PCP: Reubin Milan, MD  Type: Established Patient  Setting: Ambulatory outpatient    Location: Office  Delivery: Face-to-face     HPI  Kimberly Cowan, a 57 y.o. 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 Kimberly Cowan primary complain today is Back Pain (Lumbar bilateral ) Last encounter: My last encounter with her was on 12/24/22 Pertinent problems: Kimberly Cowan has Failed back surgical syndrome; Spinal stenosis of lumbar region; Chronic depression; Spinal cord stimulator status [employer], implanted 2017); History of lumbar fusion (L5-S1 PSIF); Encounter for long-term opiate analgesic use; Chronic pain syndrome; and Pain management contract signed on their pertinent problem list. Pain Assessment: Severity of Chronic pain is reported as a 7 /10. Location: Back Lower, Left, Right/down both legs to the toes. Onset: More than a month ago. Quality: Discomfort, Constant, Sharp, Tingling, Numbness, Throbbing. Timing: Constant. Modifying factor(s): laying down, medications. Vitals:  height is 5\' 7"  (1.702 m) and weight is 203 lb (92.1 kg). Her temperature is 97.2 F (36.2 C) (abnormal). Her blood pressure is 141/94 (abnormal) and her pulse is 78. Her oxygen saturation is 99%.    Reason for encounter: medication management.   No change in medical history since last visit.  Patient's pain is at baseline.  Patient continues multimodal pain regimen as prescribed.  States that it provides pain relief and improvement in functional status. Continues to utilize her spinal cord stimulator.   12/24/22 Was recently hospitalized for PNA- IV abx and IV fluids. Was on oxygen at the hospital at 4L, weaned down at discharge. Patient's pain is at baseline.  Patient continues multimodal pain regimen as prescribed.  States that it provides pain relief and improvement in functional status.  05/07/22: Second patient visit today.  She has completed her urine toxicology screen which is appropriate.  I will have her sign pain contract and take her on for chronic pain management.  I will send in a prescription for hydrocodone 7.5 mg nightly.  I will see her back in 4 weeks for medication management.   Please see initial clinic visit HPI below Inetta Fermo is a pleasant 57 year old female with a history of L4-S1 spinal fusion with [employer] spinal cord stimulator in place who is hoping to establish with pain management as her previous pain provider left [facility] and is no longer excepting new patients.  She states that she has done physical therapy in the past tried various medications as well as injections with limited response.  She had her spinal cord stimulator implanted in 2017 with [facility] which was beneficial for her and allowed her to wean her hydrocodone to just once at night.  She states that she is able to manage with 7.5 mg nightly.  She is hoping to have this continued.  She also has restless leg syndrome and obesity.  She also has obstructive sleep apnea.  Given that she is on low-dose  and has been compliant with previous pain clinic protocol, I will have her obtain baseline urine toxicology screen which is customary for new patients and I will see her back in  approximately 7 to 10 days to sign pain contract and take her on for medication   Pharmacotherapy Assessment  Analgesic:  Hydrocodone 7.5 mg nightly as needed  Monitoring: [place] PMP: PDMP reviewed during this encounter.       Pharmacotherapy: No side-effects or adverse reactions reported. Compliance: No problems identified. Effectiveness: Clinically acceptable.  Vernie Ammons, RN  04/15/2023  2:31 PM  Sign when Signing Visit Nursing Pain Medication Assessment:  Safety precautions to be maintained throughout the outpatient stay will include: orient to surroundings, keep bed in low position, maintain call bell within reach at all times, provide assistance with transfer out of bed and ambulation.  Medication Inspection Compliance: Kimberly Cowan did not comply with our request to bring her pills to be counted. She was reminded that bringing the medication bottles, even when empty, is a requirement.  Medication: None brought in. Pill/Patch Count: None available to be counted. Bottle Appearance: No container available. Did not bring bottle(s) to appointment. Filled Date: N/A Last Medication intake:  Yesterday    No results found for: "CBDTHCR" No results found for: "D8THCCBX" No results found for: "D9THCCBX"  UDS:  Summary  Date Value Ref Range Status  04/28/2022 Note  Final    Comment:    ==================================================================== Compliance Drug Analysis, Ur ==================================================================== Test                             Result       Flag       Units  Drug Present and Declared for Prescription Verification   Oxazepam                       641          EXPECTED   ng/mg creat   Temazepam                      >2532        EXPECTED   ng/mg creat    Oxazepam and temazepam are expected metabolites of diazepam.    Oxazepam is also an expected metabolite of other benzodiazepine    drugs, including chlordiazepoxide,  prazepam, clorazepate, halazepam,    and temazepam.  Oxazepam and temazepam are available as scheduled    prescription medications.    Gabapentin                     PRESENT      EXPECTED   Cyclobenzaprine                PRESENT      EXPECTED   Desmethylcyclobenzaprine       PRESENT      EXPECTED    Desmethylcyclobenzaprine is an expected metabolite of    cyclobenzaprine.    Acetaminophen                  PRESENT      EXPECTED  Drug Absent but Declared for Prescription Verification   Hydrocodone                    Not Detected UNEXPECTED ng/mg creat   Methocarbamol  Not Detected UNEXPECTED ==================================================================== Test                      Result    Flag   Units      Ref Range   Creatinine              79               mg/dL      >=69 ==================================================================== Declared Medications:  The flagging and interpretation on this report are based on the  following declared medications.  Unexpected results may arise from  inaccuracies in the declared medications.   **Note: The testing scope of this panel includes these medications:   Cyclobenzaprine (Flexeril)  Gabapentin (Neurontin)  Hydrocodone (Norco)  Methocarbamol (Robaxin)  Temazepam (Restoril)   **Note: The testing scope of this panel does not include small to  moderate amounts of these reported medications:   Acetaminophen (Norco)   **Note: The testing scope of this panel does not include the  following reported medications:   Fluticasone (Flonase)  Furosemide (Lasix)  Ketoconazole (Nizoral)  Latanoprost (Xalatan)  Levocetirizine (Xyzal)  Multivitamin  Pantoprazole (Protonix)  Rizatriptan (Maxalt)  Scopolamine  Vitamin B12 ==================================================================== For clinical consultation, please call [phone]. ====================================================================        ROS  Constitutional: Denies any fever or chills Gastrointestinal: No reported hemesis, hematochezia, vomiting, or acute GI distress Musculoskeletal:  + LBP Neurological: No reported episodes of acute onset apraxia, aphasia, dysarthria, agnosia, amnesia, paralysis, loss of coordination, or loss of consciousness  Medication Review  HYDROcodone-acetaminophen, Multiple Vitamins-Minerals, colestipol, cyanocobalamin, cyclobenzaprine, diphenoxylate-atropine, fluticasone, furosemide, gabapentin, latanoprost, pantoprazole, potassium chloride, rizatriptan, sertraline, and temazepam  History Review  Allergy: Kimberly Cowan is allergic to morphine and codeine, penicillins, latex, and baclofen. Drug: Kimberly Cowan  reports no history of drug use. Alcohol:  reports no history of alcohol use. Tobacco:  reports that she has never smoked. She has never used smokeless tobacco. Social: Kimberly Cowan  reports that she has never smoked. She has never used smokeless tobacco. She reports that she does not drink alcohol and does not use drugs. Medical:  has a past medical history of Allergy (1999), Anemia, Anxiety, Arm pain, left (05/17/2018), Arthritis, Bilateral lumbar radiculopathy, Cellulitis of abdominal wall (03/03/2021), Chronic back pain, Chronic GERD (03/03/2021), Clinical depression (04/04/2012), Depression, DVT (deep venous thrombosis) (HCC), Encounter for screening colonoscopy, Essential (primary) hypertension (06/04/2020), Glaucoma, Headache(784.0), Major depressive disorder with single episode, in full remission (HCC) (08/15/2018), Malfunction of jejunostomy tube (HCC) (02/13/2021), Ovarian cyst, Pneumonia, Pulmonary embolism (HCC) (05/2017), S/P laparoscopic cholecystectomy (02/07/2021), and Sleep apnea. Surgical: Ms. Wilner  has a past surgical history that includes Tonsillectomy; Gastric bypass (2005); Reduction mammaplasty (Bilateral); Spinal cord stimulator insertion (N/A, 03/20/2016);  Abdominal hysterectomy (10/2006); Laparoscopic salpingo oophorectomy (Bilateral, 10/05/2016); Back surgery (2014); Back surgery (2009); Colonoscopy with propofol (N/A, 04/12/2018); Cesarean section (1989 1995); Spine surgery (2009 2014 2017); Knee arthroscopy with lateral menisectomy (Left, 05/27/2017); Knee arthroscopy with lateral release (Left, 05/27/2017); Bariatric Surgery (07/2020); and Cholecystectomy (01/2021). Family: family history includes Arthritis in her father, mother, sister, and sister; Cancer in her father and maternal aunt; Diabetes in her maternal aunt, maternal grandmother, mother, and sister; Hypertension in her father, mother, and sister; Obesity in her daughter, father, mother, sister, and sister; Varicose Veins in her mother.  Laboratory Chemistry Profile   Renal Lab Results  Component Value Date   BUN 8 12/10/2022   CREATININE 0.57 12/10/2022   BCR 17 10/08/2022  GFRAA 100 07/24/2020   GFRNONAA >60 12/10/2022    Hepatic Lab Results  Component Value Date   AST 15 12/07/2022   ALT 15 12/07/2022   ALBUMIN 2.9 (L) 12/07/2022   ALKPHOS 61 12/07/2022   LIPASE 25 12/07/2022    Electrolytes Lab Results  Component Value Date   NA 139 12/10/2022   K 3.6 12/10/2022   CL 110 12/10/2022   CALCIUM 9.0 12/10/2022   MG 2.0 12/07/2022   PHOS 2.8 12/08/2022    Bone Lab Results  Component Value Date   VD25OH 9 02/19/2022    Inflammation (CRP: Acute Phase) (ESR: Chronic Phase) Lab Results  Component Value Date   LATICACIDVEN 1.6 12/08/2022         Note: Above Lab results reviewed.  Recent Imaging Review  MM 3D SCREEN BREAST BILATERAL CLINICAL DATA:  Screening.  EXAM: DIGITAL SCREENING BILATERAL MAMMOGRAM WITH TOMOSYNTHESIS AND CAD  TECHNIQUE: Bilateral screening digital craniocaudal and mediolateral oblique mammograms were obtained. Bilateral screening digital breast tomosynthesis was performed. The images were evaluated with computer-aided  detection.  COMPARISON:  Previous exam(s).  ACR Breast Density Category b: There are scattered areas of fibroglandular density.  FINDINGS: There are no findings suspicious for malignancy.  IMPRESSION: No mammographic evidence of malignancy. A result letter of this screening mammogram will be mailed directly to the patient.  RECOMMENDATION: Screening mammogram in one year. (Code:SM-B-01Y)  BI-RADS CATEGORY  1: Negative.  Electronically Signed   By: Jacob Moores M.D.   On: 01/26/2023 09:17 Note: Reviewed        Physical Exam  General appearance: Well nourished, well developed, and well hydrated. In no apparent acute distress Mental status: Alert, oriented x 3 (person, place, & time)       Respiratory: No evidence of acute respiratory distress Eyes: PERLA Vitals: BP (!) 141/94   Pulse 78   Temp (!) 97.2 F (36.2 C)   Ht 5\' 7"  (1.702 m)   Wt 203 lb (92.1 kg)   SpO2 99%   BMI 31.79 kg/m  BMI: Estimated body mass index is 31.79 kg/m as calculated from the following:   Height as of this encounter: 5\' 7"  (1.702 m).   Weight as of this encounter: 203 lb (92.1 kg). Ideal: Ideal body weight: 61.6 kg (135 lb 12.9 oz) Adjusted ideal body weight: 73.8 kg (162 lb 10.9 oz)    Thoracic Spine Area Exam  Skin & Axial Inspection: Well healed scar from previous spine surgery detected Alignment: Symmetrical Functional ROM: Pain restricted ROM Stability: No instability detected Muscle Tone/Strength: Functionally intact. No obvious neuro-muscular anomalies detected. Sensory (Neurological): Neurogenic pain pattern Muscle strength & Tone: No palpable anomalies Lumbar Spine Area Exam  Skin & Axial Inspection: Well healed scar from previous spine surgery detected spinal cord stimulator IPG  along the left flank Alignment: Symmetrical Functional ROM: Pain restricted ROM       Stability: No instability detected Muscle Tone/Strength: Functionally intact. No obvious neuro-muscular  anomalies detected. Sensory (Neurological): Dermatomal pain pattern   Gait & Posture Assessment  Ambulation: Limited Gait: Antalgic gait (limping) Posture: Difficulty standing up straight, due to pain  Lower Extremity Exam      Side: Right lower extremity   Side: Left lower extremity  Stability: No instability observed           Stability: No instability observed          Skin & Extremity Inspection: Skin color, temperature, and hair growth are WNL. No peripheral edema  or cyanosis. No masses, redness, swelling, asymmetry, or associated skin lesions. No contractures.   Skin & Extremity Inspection: Skin color, temperature, and hair growth are WNL. No peripheral edema or cyanosis. No masses, redness, swelling, asymmetry, or associated skin lesions. No contractures.  Functional ROM: Unrestricted ROM                   Functional ROM: Unrestricted ROM                  Muscle Tone/Strength: Functionally intact. No obvious neuro-muscular anomalies detected.   Muscle Tone/Strength: Functionally intact. No obvious neuro-muscular anomalies detected.  Sensory (Neurological): Musculoskeletal pain pattern         Sensory (Neurological): Musculoskeletal pain pattern        DTR: Patellar: deferred today Achilles: deferred today Plantar: deferred today   DTR: Patellar: deferred today Achilles: deferred today Plantar: deferred today  Palpation: No palpable anomalies   Palpation: No palpable anomalies       Assessment   Diagnosis Status  1. Failed back surgical syndrome   2. Spinal cord stimulator status [employer], implanted 2017)   3. Chronic radicular lumbar pain   4. History of lumbar fusion (L5-S1 PSIF)   5. Encounter for long-term opiate analgesic use   6. Pain management contract signed   7. Chronic pain syndrome     Controlled Controlled Controlled     Plan of Care    Kimberly Cowan has a current medication list which includes the following long-term  medication(s): colestipol, fluticasone, furosemide, rizatriptan, sertraline, temazepam, and gabapentin.  Pharmacotherapy (Medications Ordered): Meds ordered this encounter  Medications   HYDROcodone-acetaminophen (NORCO) 7.5-325 MG tablet    Sig: Take 1 tablet by mouth at bedtime as needed for moderate pain.    Dispense:  30 tablet    Refill:  0   HYDROcodone-acetaminophen (NORCO) 7.5-325 MG tablet    Sig: Take 1 tablet by mouth at bedtime as needed for moderate pain.    Dispense:  30 tablet    Refill:  0   HYDROcodone-acetaminophen (NORCO) 7.5-325 MG tablet    Sig: Take 1 tablet by mouth at bedtime as needed for moderate pain.    Dispense:  30 tablet    Refill:  0   gabapentin (NEURONTIN) 300 MG capsule    Sig: Take 2 capsules (600 mg total) by mouth at bedtime.    Dispense:  60 capsule    Refill:  5    Follow-up plan:   Return in about 3 months (around 07/16/2023) for Medication Management, in person.    I discussed the assessment and treatment plan with the patient. The patient was provided an opportunity to ask questions and all were answered. The patient agreed with the plan and demonstrated an understanding of the instructions.  Patient advised to call back or seek an in-person evaluation if the symptoms or condition worsens.  Duration of encounter: .  Total time on encounter, as per AMA guidelines included both the face-to-face and non-face-to-face time personally spent by the physician and/or other qualified health care professional(s) on the day of the encounter (includes time in activities that require the physician or other qualified health care professional and does not include time in activities normally performed by clinical staff). Physician's time may include the following activities when performed: preparing to see the patient (eg, review of tests, pre-charting review of records) obtaining and/or reviewing separately obtained history performing a medically  appropriate examination and/or evaluation  counseling and educating the patient/family/caregiver ordering medications, tests, or procedures referring and communicating with other health care professionals (when not separately reported) documenting clinical information in the electronic or other health record independently interpreting results (not separately reported) and communicating results to the patient/ family/caregiver care coordination (not separately reported)  Note by: Edward Jolly, MD Date: 04/15/2023; Time: 3:06 PM

## 2023-04-15 NOTE — Progress Notes (Signed)
Nursing Pain Medication Assessment:  Safety precautions to be maintained throughout the outpatient stay will include: orient to surroundings, keep bed in low position, maintain call bell within reach at all times, provide assistance with transfer out of bed and ambulation.  Medication Inspection Compliance: Ms. Dorfman did not comply with our request to bring her pills to be counted. She was reminded that bringing the medication bottles, even when empty, is a requirement.  Medication: None brought in. Pill/Patch Count: None available to be counted. Bottle Appearance: No container available. Did not bring bottle(s) to appointment. Filled Date: N/A Last Medication intake:  Yesterday

## 2023-04-22 DIAGNOSIS — E509 Vitamin A deficiency, unspecified: Secondary | ICD-10-CM | POA: Diagnosis not present

## 2023-04-22 DIAGNOSIS — E559 Vitamin D deficiency, unspecified: Secondary | ICD-10-CM | POA: Diagnosis not present

## 2023-04-24 ENCOUNTER — Encounter: Payer: Self-pay | Admitting: Internal Medicine

## 2023-04-24 LAB — HEMOGLOBIN A1C: Hemoglobin A1C: 5.8

## 2023-04-26 ENCOUNTER — Encounter: Payer: Self-pay | Admitting: Internal Medicine

## 2023-06-01 DIAGNOSIS — K8681 Exocrine pancreatic insufficiency: Secondary | ICD-10-CM | POA: Diagnosis not present

## 2023-06-01 DIAGNOSIS — R197 Diarrhea, unspecified: Secondary | ICD-10-CM | POA: Diagnosis not present

## 2023-06-01 DIAGNOSIS — K909 Intestinal malabsorption, unspecified: Secondary | ICD-10-CM | POA: Diagnosis not present

## 2023-06-01 DIAGNOSIS — Z9884 Bariatric surgery status: Secondary | ICD-10-CM | POA: Diagnosis not present

## 2023-06-05 ENCOUNTER — Other Ambulatory Visit: Payer: Self-pay | Admitting: Internal Medicine

## 2023-06-05 DIAGNOSIS — F411 Generalized anxiety disorder: Secondary | ICD-10-CM

## 2023-06-07 ENCOUNTER — Other Ambulatory Visit: Payer: Self-pay

## 2023-06-07 ENCOUNTER — Encounter: Payer: Self-pay | Admitting: Internal Medicine

## 2023-06-07 DIAGNOSIS — F411 Generalized anxiety disorder: Secondary | ICD-10-CM

## 2023-06-07 MED ORDER — SERTRALINE HCL 100 MG PO TABS
100.0000 mg | ORAL_TABLET | Freq: Every day | ORAL | 1 refills | Status: DC
Start: 2023-06-07 — End: 2023-12-03

## 2023-07-05 ENCOUNTER — Encounter: Payer: Self-pay | Admitting: Internal Medicine

## 2023-07-05 ENCOUNTER — Ambulatory Visit
Admission: EM | Admit: 2023-07-05 | Discharge: 2023-07-05 | Disposition: A | Discharge status: Home / Self Care | Payer: BC Managed Care – PPO | Attending: Emergency Medicine | Admitting: Emergency Medicine

## 2023-07-05 DIAGNOSIS — N760 Acute vaginitis: Secondary | ICD-10-CM | POA: Diagnosis present

## 2023-07-05 DIAGNOSIS — N898 Other specified noninflammatory disorders of vagina: Secondary | ICD-10-CM | POA: Insufficient documentation

## 2023-07-05 DIAGNOSIS — B9689 Other specified bacterial agents as the cause of diseases classified elsewhere: Secondary | ICD-10-CM | POA: Insufficient documentation

## 2023-07-05 DIAGNOSIS — A599 Trichomoniasis, unspecified: Secondary | ICD-10-CM | POA: Insufficient documentation

## 2023-07-05 LAB — WET PREP, GENITAL
Sperm: NONE SEEN
WBC, Wet Prep HPF POC: >10 — AB (ref <10)
Yeast Wet Prep HPF POC: NONE SEEN

## 2023-07-05 MED ORDER — METRONIDAZOLE 500 MG PO TABS
500.0000 mg | ORAL_TABLET | Freq: Two times a day (BID) | ORAL | 0 refills | Status: DC
Start: 2023-07-05 — End: 2023-09-03

## 2023-07-05 NOTE — ED Provider Notes (Signed)
[facility]    CSN: [id number] Arrival date & time: 07/05/23  1835      History   Chief Complaint Chief Complaint  Patient presents with   Vaginal Discharge    HPI Enyah Mirra Basilio is a 57 y.o. female.   57 year old female, Aaryana Betke, presents to urgent care for evaluation of vaginal discharge x 6 days, treated with monistat without relief. Pt reports new sexual partner. Pt denies any fever , malodor, nausea, abdominal pain or dysuria.  The history is provided by the patient. No language interpreter was used.    Past Medical History:  Diagnosis Date   Allergy 1999   SEASONAL   Anemia    Anxiety    Arm pain, left 05/17/2018   Arthritis    lower spine   Bilateral lumbar radiculopathy    Cellulitis of abdominal wall 03/03/2021   Chronic back pain    Chronic GERD 03/03/2021   Clinical depression 04/04/2012   Depression    DVT (deep venous thrombosis) (HCC)    Encounter for screening colonoscopy    Essential (primary) hypertension 06/04/2020   Glaucoma    RIGHT EYE   Headache(784.0)    migraines   Major depressive disorder with single episode, in full remission (HCC) 08/15/2018   Malfunction of jejunostomy tube (HCC) 02/13/2021   Ovarian cyst    Pneumonia    walking pneumonia   Pulmonary embolism (HCC) 05/2017   Provoked, following knee surgery   S/P laparoscopic cholecystectomy 02/07/2021   Sleep apnea    no cpcap     last sleep study > 4 yrs    Patient Active Problem List   Diagnosis Date Noted   BV (bacterial vaginosis) 07/05/2023   Trichomoniasis 07/05/2023   Vaginal discharge 07/05/2023   Acute respiratory failure with hypoxia (HCC) 12/31/2022   Pneumonia of left lower lobe due to infectious organism 12/07/2022   Depression with anxiety 12/07/2022   Nausea vomiting and diarrhea 12/07/2022   Obesity (BMI 30-39.9) 12/07/2022   HTN (hypertension) 12/07/2022   Severe sepsis (HCC) 12/07/2022   Generalized anxiety  disorder 10/05/2022   Pain management contract signed 05/07/2022   Spinal cord stimulator status [employer], implanted 2017) 04/28/2022   History of lumbar fusion (L5-S1 PSIF) 04/28/2022   Encounter for long-term opiate analgesic use 04/28/2022   Chronic pain syndrome 04/28/2022   High serum parathyroid hormone (PTH) 02/25/2022   Vitamin A deficiency 02/25/2022   Vitamin D deficiency 02/25/2022   Exocrine pancreatic insufficiency 02/19/2022   Degenerative spondylolisthesis 12/19/2021   Chronic GERD 03/03/2021   Hypokalemia 03/03/2021   Status post hysterectomy 11/05/2020   Bariatric surgery status 01/01/2020   Primary insomnia 12/28/2019   Failed back surgical syndrome 11/27/2019   Mild peripheral edema 11/15/2017   Perforated ear drum, left 06/25/2017   History of pulmonary embolism 06/10/2017   Environmental and seasonal allergies 04/07/2017   Tinea versicolor 02/03/2017   Vasomotor symptoms due to menopause 11/11/2016   Glaucoma 09/28/2016   B12 nutritional deficiency 08/05/2016   OSA on CPAP 03/04/2016   Chronic depression 12/09/2015   Anxiety 12/09/2015   Chronic radicular lumbar pain 08/14/2015   Status post gastric bypass for obesity 07/04/2015   Prediabetes 07/01/2015   Spinal stenosis of lumbar region 12/10/2014   Headache, migraine 04/04/2012    Past Surgical History:  Procedure Laterality Date   ABDOMINAL HYSTERECTOMY  10/2006   BACK SURGERY  2014   lumbar fusion   BACK SURGERY  2009  discectomy   BARIATRIC SURGERY  07/2020   Revision   CESAREAN SECTION  1989 1995   CHOLECYSTECTOMY  01/2021   COLONOSCOPY WITH PROPOFOL N/A 04/12/2018   Procedure: COLONOSCOPY WITH PROPOFOL;  Surgeon: Toney Reil, MD;  Location: [facility];  Service: Gastroenterology;  Laterality: N/A;   GASTRIC BYPASS  2005   KNEE ARTHROSCOPY WITH LATERAL MENISECTOMY Left 05/27/2017   Procedure: KNEE ARTHROSCOPY WITH LATERAL MENISECTOMY;  Surgeon: Kennedy Bucker, MD;   Location: [facility] ORS;  Service: Orthopedics;  Laterality: Left;   KNEE ARTHROSCOPY WITH LATERAL RELEASE Left 05/27/2017   Procedure: KNEE ARTHROSCOPY WITH LATERAL RELEASE;  Surgeon: Kennedy Bucker, MD;  Location: [facility];  Service: Orthopedics;  Laterality: Left;   LAPAROSCOPIC SALPINGO OOPHERECTOMY Bilateral 10/05/2016   Procedure: LAPAROSCOPIC SALPINGO OOPHORECTOMY;  Surgeon: Herold Harms, MD;  Location: [facility];  Service: Gynecology;  Laterality: Bilateral;   REDUCTION MAMMAPLASTY Bilateral    2001   SPINAL CORD STIMULATOR INSERTION N/A 03/20/2016   Procedure: LUMBAR SPINAL CORD STIMULATOR INSERTION;  Surgeon: Tia Alert, MD;  Location: [facility];  Service: Neurosurgery;  Laterality: N/A;   SPINE SURGERY  2009 2014 2017   TONSILLECTOMY      OB History     Gravida  2   Para  2   Term  2   Preterm      AB      Living  2      SAB      IAB      Ectopic      Multiple      Live Births  2            Home Medications    Prior to Admission medications   Medication Sig Start Date End Date Taking? Authorizing Provider  colestipol (COLESTID) 1 g tablet Take 1 g by mouth 2 (two) times daily. 03/23/23  Yes [provider]  cyanocobalamin (VITAMIN B12) 1000 MCG/ML injection ADMINISTER 1 ML(1000 MCG) IN THE MUSCLE EVERY 30 DAYS 02/16/23  Yes Reubin Milan, MD  cyclobenzaprine (FLEXERIL) 10 MG tablet Take 10 mg by mouth daily as needed for muscle spasms.   Yes [provider]  diphenoxylate-atropine (LOMOTIL) 2.5-0.025 MG tablet Take 1 tablet by mouth 4 (four) times daily as needed.   Yes [provider]  fluticasone (FLONASE) 50 MCG/ACT nasal spray Place 1 spray into both nostrils daily as needed for allergies. 04/09/22  Yes Reubin Milan, MD  furosemide (LASIX) 20 MG tablet TAKE 1 TABLET(20 MG) BY MOUTH DAILY 09/02/22  Yes Reubin Milan, MD  gabapentin (NEURONTIN) 300 MG capsule Take 2 capsules (600 mg total) by mouth at  bedtime. 04/15/23  Yes Edward Jolly, MD  HYDROcodone-acetaminophen (NORCO) 7.5-325 MG tablet Take 1 tablet by mouth at bedtime as needed for moderate pain. 06/18/23 07/18/23 Yes Edward Jolly, MD  latanoprost (XALATAN) 0.005 % ophthalmic solution Place 1 drop into both eyes at bedtime.    Yes [provider]  metroNIDAZOLE (FLAGYL) 500 MG tablet Take 1 tablet (500 mg total) by mouth 2 (two) times daily. Avoid alcohol while taking this medication 07/05/23  Yes Wellington Winegarden, Para March, NP  Multiple Vitamins-Minerals (MULTIVITAMIN GUMMIES ADULTS PO) Take 1 tablet by mouth daily.   Yes [provider]  pantoprazole (PROTONIX) 40 MG tablet Take 40 mg by mouth 2 (two) times daily.   Yes [provider]  potassium chloride (KLOR-CON) 10 MEQ tablet Take 10 mEq by mouth 2 (two) times daily.  11/30/22  Yes [provider]  rizatriptan (MAXALT-MLT) 10 MG disintegrating tablet DISSOLVE 1 TABLET BY MOUTH AS NEEDED FOR MIGRAINE. MAY REPEAT IN 2 HOURS AS NEEDED 12/22/22  Yes Reubin Milan, MD  sertraline (ZOLOFT) 100 MG tablet Take 1 tablet (100 mg total) by mouth daily. 06/07/23  Yes Reubin Milan, MD  temazepam (RESTORIL) 15 MG capsule Take 1 capsule (15 mg total) by mouth at bedtime as needed for sleep. 03/02/23  Yes Reubin Milan, MD    Family History Family History  Problem Relation Age of Onset   Diabetes Mother    Hypertension Mother    Obesity Mother    Arthritis Mother    Varicose Veins Mother    Diabetes Sister    Hypertension Sister    Arthritis Sister    Hypertension Father    Arthritis Father    Cancer Father    Obesity Father    Obesity Daughter    Obesity Sister    Arthritis Sister    Obesity Sister    Diabetes Maternal Grandmother    Cancer Maternal Aunt    Diabetes Maternal Aunt    Breast cancer Neg Hx     Social History Social History   Tobacco Use   Smoking status: Never   Smokeless tobacco: Never   Tobacco comments:    smoking  cessation materials not required  Vaping Use   Vaping status: Never Used  Substance Use Topics   Alcohol use: No   Drug use: No     Allergies   Morphine and codeine, Penicillins, Latex, and Baclofen   Review of Systems Review of Systems  Constitutional:  Negative for fever.  Gastrointestinal:  Negative for abdominal pain, diarrhea, nausea and vomiting.  Genitourinary:  Positive for vaginal discharge. Negative for dysuria.  All other systems reviewed and are negative.    Physical Exam Triage Vital Signs ED Triage Vitals  Encounter Vitals Group     BP 07/05/23 1848 (!) 145/79     Systolic BP Percentile --      Diastolic BP Percentile --      Pulse Rate 07/05/23 1848 74     Resp 07/05/23 1848 17     Temp 07/05/23 1847 98.3 F (36.8 C)     Temp Source 07/05/23 1848 Oral     SpO2 07/05/23 1848 94 %     Weight --      Height --      Head Circumference --      Peak Flow --      Pain Score 07/05/23 1846 0     Pain Loc --      Pain Education --      Exclude from Growth Chart --    No data found.  Updated Vital Signs BP (!) 145/79 (BP Location: Left Wrist)   Pulse 74   Temp 98.3 F (36.8 C) (Oral)   Resp 17   SpO2 94%   Visual Acuity Right Eye Distance:   Left Eye Distance:   Bilateral Distance:    Right Eye Near:   Left Eye Near:    Bilateral Near:     Physical Exam Vitals and nursing note reviewed.  Constitutional:      General: She is not in acute distress.    Appearance: She is well-developed.  HENT:     Head: Normocephalic and atraumatic.  Eyes:     Conjunctiva/sclera: Conjunctivae normal.  Cardiovascular:     Rate and Rhythm: Normal  rate and regular rhythm.     Heart sounds: No murmur heard. Pulmonary:     Effort: Pulmonary effort is normal. No respiratory distress.     Breath sounds: Normal breath sounds.  Abdominal:     Palpations: Abdomen is soft.     Tenderness: There is no abdominal tenderness.  Genitourinary:    Comments: Deferred pt  self swabbed Musculoskeletal:        General: No swelling.     Cervical back: Neck supple.  Skin:    General: Skin is warm and dry.     Capillary Refill: Capillary refill takes less than 2 seconds.  Neurological:     General: No focal deficit present.     Mental Status: She is alert and oriented to person, place, and time.     GCS: GCS eye subscore is 4. GCS verbal subscore is 5. GCS motor subscore is 6.     Cranial Nerves: No cranial nerve deficit.     Sensory: No sensory deficit.  Psychiatric:        Attention and Perception: Attention normal.        Mood and Affect: Mood normal.        Speech: Speech normal.        Behavior: Behavior normal.      UC Treatments / Results  Labs (all labs ordered are listed, but only abnormal results are displayed) Labs Reviewed  WET PREP, GENITAL - Abnormal; Notable for the following components:      Result Value   Trich, Wet Prep PRESENT (*)    Clue Cells Wet Prep HPF POC PRESENT (*)    WBC, Wet Prep HPF POC >10 (*)    All other components within normal limits  CERVICOVAGINAL ANCILLARY ONLY    EKG   Radiology No results found.  Procedures Procedures (including critical care time)  Medications Ordered in UC Medications - No data to display  Initial Impression / Assessment and Plan / UC Course  I have reviewed the triage vital signs and the nursing notes.  Pertinent labs & imaging results that were available during my care of the patient were reviewed by me and considered in my medical decision making (see chart for details).     Ddx: Vaginal discharge due to STI, Trichomoniasis, BV Final Clinical Impressions(s) / UC Diagnoses   Final diagnoses:  BV (bacterial vaginosis)  Trichomoniasis  Vaginal discharge     Discharge Instructions      Take flagyl as directed,drink plenty of water, avoid alcohol while taking this medication. Check my chart for results. Avoid sexual activity until results,treatment known and  completed. Safe sex with all future sexual activity. We have sent testing for sexually transmitted infections. We will notify you of any positive results once they are received. If required, we will prescribe any medications you might need.   Please refrain from all sexual activity for at least the next seven days.   Follow up with PCP. Return as needed.     ED Prescriptions     Medication Sig Dispense Auth. Provider   metroNIDAZOLE (FLAGYL) 500 MG tablet Take 1 tablet (500 mg total) by mouth 2 (two) times daily. Avoid alcohol while taking this medication 14 tablet Jasani Dolney, Para March, NP      PDMP not reviewed this encounter.   Clancy Gourd, NP 07/05/23 2037

## 2023-07-05 NOTE — Discharge Instructions (Addendum)
Take flagyl as directed,drink plenty of water, avoid alcohol while taking this medication. Check my chart for results. Avoid sexual activity until results,treatment known and completed. Safe sex with all future sexual activity. We have sent testing for sexually transmitted infections. We will notify you of any positive results once they are received. If required, we will prescribe any medications you might need.   Please refrain from all sexual activity for at least the next seven days.   Follow up with PCP. Return as needed.

## 2023-07-05 NOTE — ED Triage Notes (Signed)
Vaginal discharge x 6 days. No smell. Some itching. Did monistat treatment with no relief.

## 2023-07-06 ENCOUNTER — Ambulatory Visit: Payer: 59 | Admitting: Internal Medicine

## 2023-07-06 LAB — CERVICOVAGINAL ANCILLARY ONLY
Chlamydia: NEGATIVE
Comment: NEGATIVE
Comment: NEGATIVE
Comment: NORMAL
Neisseria Gonorrhea: NEGATIVE
Trichomonas: POSITIVE — AB

## 2023-07-15 ENCOUNTER — Ambulatory Visit
Payer: BC Managed Care – PPO | Attending: Student in an Organized Health Care Education/Training Program | Admitting: Student in an Organized Health Care Education/Training Program

## 2023-07-15 ENCOUNTER — Encounter: Payer: Self-pay | Admitting: Student in an Organized Health Care Education/Training Program

## 2023-07-15 VITALS — BP 125/86 | HR 78 | Temp 97.3°F | Resp 16 | Ht 66.0 in | Wt 203.0 lb

## 2023-07-15 DIAGNOSIS — Z981 Arthrodesis status: Secondary | ICD-10-CM | POA: Insufficient documentation

## 2023-07-15 DIAGNOSIS — G8929 Other chronic pain: Secondary | ICD-10-CM | POA: Diagnosis not present

## 2023-07-15 DIAGNOSIS — Z9689 Presence of other specified functional implants: Secondary | ICD-10-CM | POA: Diagnosis not present

## 2023-07-15 DIAGNOSIS — Z79891 Long term (current) use of opiate analgesic: Secondary | ICD-10-CM | POA: Insufficient documentation

## 2023-07-15 DIAGNOSIS — M961 Postlaminectomy syndrome, not elsewhere classified: Secondary | ICD-10-CM | POA: Diagnosis not present

## 2023-07-15 DIAGNOSIS — M5416 Radiculopathy, lumbar region: Secondary | ICD-10-CM | POA: Diagnosis not present

## 2023-07-15 DIAGNOSIS — G894 Chronic pain syndrome: Secondary | ICD-10-CM | POA: Diagnosis not present

## 2023-07-15 MED ORDER — HYDROCODONE-ACETAMINOPHEN 7.5-325 MG PO TABS: 1.0000 | ORAL_TABLET | Freq: Every evening | ORAL | 0 refills | Status: DC | PRN

## 2023-07-15 MED ORDER — HYDROCODONE-ACETAMINOPHEN 10-325 MG PO TABS: 1.0000 | ORAL_TABLET | Freq: Three times a day (TID) | ORAL | 0 refills | Status: AC | PRN

## 2023-07-15 MED ORDER — GABAPENTIN 300 MG PO CAPS: 600.0000 mg | ORAL_CAPSULE | Freq: Every day | ORAL | 5 refills | Status: DC

## 2023-07-15 MED ORDER — CYCLOBENZAPRINE HCL 10 MG PO TABS: 10.0000 mg | ORAL_TABLET | Freq: Every day | ORAL | 5 refills | Status: DC | PRN

## 2023-07-15 NOTE — Progress Notes (Signed)
PROVIDER NOTE: Information contained herein reflects review and annotations entered in association with encounter. Interpretation of such information and data should be left to medically-trained personnel. Information provided to patient can be located elsewhere in the medical record under "Patient Instructions". Document created using STT-dictation technology, any transcriptional errors that may result from process are unintentional.    Patient: Kimberly Cowan  Service Category: E/M  Provider: Edward Jolly, MD  DOB: 08-18-1966  DOS: 07/15/2023  Referring Provider: Reubin Milan, MD  MRN: [id number]  Specialty: Interventional Pain Management  PCP: Reubin Milan, MD  Type: Established Patient  Setting: Ambulatory outpatient    Location: Office  Delivery: Face-to-face     HPI  Kimberly Cowan, a 57 y.o. 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 Kimberly Cowan primary complain today is Back Pain Last encounter: My last encounter with her was on 04/15/23 Pertinent problems: Kimberly Cowan has Failed back surgical syndrome; Spinal stenosis of lumbar region; Chronic depression; Spinal cord stimulator status [employer], implanted 2017); History of lumbar fusion (L5-S1 PSIF); Encounter for long-term opiate analgesic use; Chronic pain syndrome; and Pain management contract signed on their pertinent problem list. Pain Assessment: Severity of Chronic pain is reported as a 9 /10. Location: Back Lower/down legs to toes bilat. Onset: More than a month ago. Quality: Aching, Constant, Sharp, Shooting. Timing: Constant. Modifying factor(s): meds, laying down. Vitals:  height is 5\' 6"  (1.676 m) and weight is 203 lb (92.1 kg). Her temperature is 97.3 F (36.3 C) (abnormal). Her blood pressure is 125/86 and her pulse is 78. Her respiration is 16 and oxygen saturation is 100%.   Reason for encounter: medication management.   No  change in medical history since last visit.  Patient states that she is experiencing increased lumbar spine pain and attributes that to the weather.  She is requesting a slight increase in her hydrocodone 7.5 to 10 mg daily.  Patient continues multimodal pain regimen as prescribed.  States that it provides pain relief and improvement in functional status. Continues to utilize her spinal cord stimulator.    12/24/22 Was recently hospitalized for PNA- IV abx and IV fluids. Was on oxygen at the hospital at 4L, weaned down at discharge. Patient's pain is at baseline.  Patient continues multimodal pain regimen as prescribed.  States that it provides pain relief and improvement in functional status.  05/07/22: Second patient visit today.  She has completed her urine toxicology screen which is appropriate.  I will have her sign pain contract and take her on for chronic pain management.  I will send in a prescription for hydrocodone 7.5 mg nightly.  I will see her back in 4 weeks for medication management.   Please see initial clinic visit HPI below Kimberly Cowan is a pleasant 57 year old female with a history of L4-S1 spinal fusion with [employer] spinal cord stimulator in place who is hoping to establish with pain management as her previous pain provider left [facility] and is no longer excepting new patients.  She states that she has done physical therapy in the past tried various medications as well as injections with limited response.  She had her spinal cord stimulator implanted in 2017 with [facility] which was beneficial for her and allowed her to wean her hydrocodone to just once at night.  She states that she is able to manage with 7.5 mg nightly.  She is hoping to have this continued.  She also has restless leg syndrome and obesity.  She also has obstructive sleep apnea.  Given that she is on low-dose and has been compliant with previous pain clinic protocol, I will have her obtain  baseline urine toxicology screen which is customary for new patients and I will see her back in approximately 7 to 10 days to sign pain contract and take her on for medication   Pharmacotherapy Assessment  Analgesic:  Hydrocodone 7.5 mg nightly as needed  Monitoring: [place] PMP: PDMP reviewed during this encounter.       Pharmacotherapy: No side-effects or adverse reactions reported. Compliance: No problems identified. Effectiveness: Clinically acceptable.  Nonah Mattes, RN  07/15/2023  2:13 PM  Sign when Signing Visit Nursing Pain Medication Assessment:  Safety precautions to be maintained throughout the outpatient stay will include: orient to surroundings, keep bed in low position, maintain call bell within reach at all times, provide assistance with transfer out of bed and ambulation.  Medication Inspection Compliance: Pill count conducted under aseptic conditions, in front of the patient. Neither the pills nor the bottle was removed from the patient's sight at any time. Once count was completed pills were immediately returned to the patient in their original bottle.  Medication: Hydrocodone/APAP Pill/Patch Count:  7 of 30 pills remain Pill/Patch Appearance: Markings consistent with prescribed medication Bottle Appearance: Standard pharmacy container. Clearly labeled. Filled Date: 19 / 22 / 2024 Last Medication intake:  Yesterday    No results found for: "CBDTHCR" No results found for: "D8THCCBX" No results found for: "D9THCCBX"  UDS:  Summary  Date Value Ref Range Status  04/28/2022 Note  Final    Comment:    ==================================================================== Compliance Drug Analysis, Ur ==================================================================== Test                             Result       Flag       Units  Drug Present and Declared for Prescription Verification   Oxazepam                       641          EXPECTED   ng/mg creat   Temazepam                       >2532        EXPECTED   ng/mg creat    Oxazepam and temazepam are expected metabolites of diazepam.    Oxazepam is also an expected metabolite of other benzodiazepine    drugs, including chlordiazepoxide, prazepam, clorazepate, halazepam,    and temazepam.  Oxazepam and temazepam are available as scheduled    prescription medications.    Gabapentin                     PRESENT      EXPECTED   Cyclobenzaprine                PRESENT      EXPECTED   Desmethylcyclobenzaprine       PRESENT      EXPECTED    Desmethylcyclobenzaprine is an expected metabolite of    cyclobenzaprine.    Acetaminophen                  PRESENT      EXPECTED  Drug Absent but Declared for Prescription Verification   Hydrocodone  Not Detected UNEXPECTED ng/mg creat   Methocarbamol                  Not Detected UNEXPECTED ==================================================================== Test                      Result    Flag   Units      Ref Range   Creatinine              79               mg/dL      >=16 ==================================================================== Declared Medications:  The flagging and interpretation on this report are based on the  following declared medications.  Unexpected results may arise from  inaccuracies in the declared medications.   **Note: The testing scope of this panel includes these medications:   Cyclobenzaprine (Flexeril)  Gabapentin (Neurontin)  Hydrocodone (Norco)  Methocarbamol (Robaxin)  Temazepam (Restoril)   **Note: The testing scope of this panel does not include small to  moderate amounts of these reported medications:   Acetaminophen (Norco)   **Note: The testing scope of this panel does not include the  following reported medications:   Fluticasone (Flonase)  Furosemide (Lasix)  Ketoconazole (Nizoral)  Latanoprost (Xalatan)  Levocetirizine (Xyzal)  Multivitamin  Pantoprazole (Protonix)  Rizatriptan (Maxalt)   Scopolamine  Vitamin B12 ==================================================================== For clinical consultation, please call [phone]. ====================================================================       ROS  Constitutional: Denies any fever or chills Gastrointestinal: No reported hemesis, hematochezia, vomiting, or acute GI distress Musculoskeletal:  + LBP Neurological: No reported episodes of acute onset apraxia, aphasia, dysarthria, agnosia, amnesia, paralysis, loss of coordination, or loss of consciousness  Medication Review  HYDROcodone-acetaminophen, Multiple Vitamins-Minerals, colestipol, cyanocobalamin, cyclobenzaprine, diphenoxylate-atropine, fluticasone, furosemide, gabapentin, latanoprost, metroNIDAZOLE, pantoprazole, potassium chloride, rizatriptan, sertraline, and temazepam  History Review  Allergy: Kimberly Cowan is allergic to morphine and codeine, penicillins, latex, and baclofen. Drug: Kimberly Cowan  reports no history of drug use. Alcohol:  reports no history of alcohol use. Tobacco:  reports that she has never smoked. She has never used smokeless tobacco. Social: Kimberly Cowan  reports that she has never smoked. She has never used smokeless tobacco. She reports that she does not drink alcohol and does not use drugs. Medical:  has a past medical history of Allergy (1999), Anemia, Anxiety, Arm pain, left (05/17/2018), Arthritis, Bilateral lumbar radiculopathy, Cellulitis of abdominal wall (03/03/2021), Chronic back pain, Chronic GERD (03/03/2021), Clinical depression (04/04/2012), Depression, DVT (deep venous thrombosis) (HCC), Encounter for screening colonoscopy, Essential (primary) hypertension (06/04/2020), Glaucoma, Headache(784.0), Major depressive disorder with single episode, in full remission (HCC) (08/15/2018), Malfunction of jejunostomy tube (HCC) (02/13/2021), Ovarian cyst, Pneumonia, Pulmonary embolism (HCC) (05/2017), S/P laparoscopic  cholecystectomy (02/07/2021), and Sleep apnea. Surgical: Kimberly Cowan  has a past surgical history that includes Tonsillectomy; Gastric bypass (2005); Reduction mammaplasty (Bilateral); Spinal cord stimulator insertion (N/A, 03/20/2016); Abdominal hysterectomy (10/2006); Laparoscopic salpingo oophorectomy (Bilateral, 10/05/2016); Back surgery (2014); Back surgery (2009); Colonoscopy with propofol (N/A, 04/12/2018); Cesarean section (1989 1995); Spine surgery (2009 2014 2017); Knee arthroscopy with lateral menisectomy (Left, 05/27/2017); Knee arthroscopy with lateral release (Left, 05/27/2017); Bariatric Surgery (07/2020); and Cholecystectomy (01/2021). Family: family history includes Arthritis in her father, mother, sister, and sister; Cancer in her father and maternal aunt; Diabetes in her maternal aunt, maternal grandmother, mother, and sister; Hypertension in her father, mother, and sister; Obesity in her daughter, father, mother, sister, and sister; Varicose Veins in her mother.  Laboratory Chemistry  Profile   Renal Lab Results  Component Value Date   BUN 8 12/10/2022   CREATININE 0.57 12/10/2022   BCR 17 10/08/2022   GFRAA 100 07/24/2020   GFRNONAA >60 12/10/2022    Hepatic Lab Results  Component Value Date   AST 15 12/07/2022   ALT 15 12/07/2022   ALBUMIN 2.9 (L) 12/07/2022   ALKPHOS 61 12/07/2022   LIPASE 25 12/07/2022    Electrolytes Lab Results  Component Value Date   NA 139 12/10/2022   K 3.6 12/10/2022   CL 110 12/10/2022   CALCIUM 9.0 12/10/2022   MG 2.0 12/07/2022   PHOS 2.8 12/08/2022    Bone Lab Results  Component Value Date   VD25OH 9 02/19/2022    Inflammation (CRP: Acute Phase) (ESR: Chronic Phase) Lab Results  Component Value Date   LATICACIDVEN 1.6 12/08/2022         Note: Above Lab results reviewed.  Recent Imaging Review  MM 3D SCREEN BREAST BILATERAL CLINICAL DATA:  Screening.  EXAM: DIGITAL SCREENING BILATERAL MAMMOGRAM WITH TOMOSYNTHESIS  AND CAD  TECHNIQUE: Bilateral screening digital craniocaudal and mediolateral oblique mammograms were obtained. Bilateral screening digital breast tomosynthesis was performed. The images were evaluated with computer-aided detection.  COMPARISON:  Previous exam(s).  ACR Breast Density Category b: There are scattered areas of fibroglandular density.  FINDINGS: There are no findings suspicious for malignancy.  IMPRESSION: No mammographic evidence of malignancy. A result letter of this screening mammogram will be mailed directly to the patient.  RECOMMENDATION: Screening mammogram in one year. (Code:SM-B-01Y)  BI-RADS CATEGORY  1: Negative.  Electronically Signed   By: Jacob Moores M.D.   On: 01/26/2023 09:17 Note: Reviewed        Physical Exam  General appearance: Well nourished, well developed, and well hydrated. In no apparent acute distress Mental status: Alert, oriented x 3 (person, place, & time)       Respiratory: No evidence of acute respiratory distress Eyes: PERLA Vitals: BP 125/86   Pulse 78   Temp (!) 97.3 F (36.3 C)   Resp 16   Ht 5\' 6"  (1.676 m)   Wt 203 lb (92.1 kg)   SpO2 100%   BMI 32.77 kg/m  BMI: Estimated body mass index is 32.77 kg/m as calculated from the following:   Height as of this encounter: 5\' 6"  (1.676 m).   Weight as of this encounter: 203 lb (92.1 kg). Ideal: Ideal body weight: 59.3 kg (130 lb 11.7 oz) Adjusted ideal body weight: 72.4 kg (159 lb 10.2 oz)    Thoracic Spine Area Exam  Skin & Axial Inspection: Well healed scar from previous spine surgery detected Alignment: Symmetrical Functional ROM: Pain restricted ROM Stability: No instability detected Muscle Tone/Strength: Functionally intact. No obvious neuro-muscular anomalies detected. Sensory (Neurological): Neurogenic pain pattern Muscle strength & Tone: No palpable anomalies Lumbar Spine Area Exam  Skin & Axial Inspection: Well healed scar from previous spine surgery  detected spinal cord stimulator IPG  along the left flank Alignment: Symmetrical Functional ROM: Pain restricted ROM       Stability: No instability detected Muscle Tone/Strength: Functionally intact. No obvious neuro-muscular anomalies detected. Sensory (Neurological): Dermatomal pain pattern   Gait & Posture Assessment  Ambulation: Limited Gait: Antalgic gait (limping) Posture: Difficulty standing up straight, due to pain  Lower Extremity Exam      Side: Right lower extremity   Side: Left lower extremity  Stability: No instability observed  Stability: No instability observed          Skin & Extremity Inspection: Skin color, temperature, and hair growth are WNL. No peripheral edema or cyanosis. No masses, redness, swelling, asymmetry, or associated skin lesions. No contractures.   Skin & Extremity Inspection: Skin color, temperature, and hair growth are WNL. No peripheral edema or cyanosis. No masses, redness, swelling, asymmetry, or associated skin lesions. No contractures.  Functional ROM: Unrestricted ROM                   Functional ROM: Unrestricted ROM                  Muscle Tone/Strength: Functionally intact. No obvious neuro-muscular anomalies detected.   Muscle Tone/Strength: Functionally intact. No obvious neuro-muscular anomalies detected.  Sensory (Neurological): Musculoskeletal pain pattern         Sensory (Neurological): Musculoskeletal pain pattern        DTR: Patellar: deferred today Achilles: deferred today Plantar: deferred today   DTR: Patellar: deferred today Achilles: deferred today Plantar: deferred today  Palpation: No palpable anomalies   Palpation: No palpable anomalies       Assessment   Diagnosis Status  1. Failed back surgical syndrome   2. Spinal cord stimulator status   3. Chronic radicular lumbar pain   4. History of lumbar fusion   5. Encounter for long-term opiate analgesic use   6. Chronic pain syndrome     Controlled Controlled Controlled     Plan of Care    Kimberly Cowan has a current medication list which includes the following long-term medication(s): colestipol, fluticasone, furosemide, rizatriptan, sertraline, temazepam, and [START ON 09/17/2023] gabapentin.  Pharmacotherapy (Medications Ordered): Meds ordered this encounter  Medications   DISCONTD: HYDROcodone-acetaminophen (NORCO) 7.5-325 MG tablet    Sig: Take 1 tablet by mouth at bedtime as needed for moderate pain (pain score 4-6).    Dispense:  30 tablet    Refill:  0   DISCONTD: HYDROcodone-acetaminophen (NORCO) 7.5-325 MG tablet    Sig: Take 1 tablet by mouth at bedtime as needed for moderate pain (pain score 4-6).    Dispense:  30 tablet    Refill:  0   DISCONTD: HYDROcodone-acetaminophen (NORCO) 7.5-325 MG tablet    Sig: Take 1 tablet by mouth at bedtime as needed for moderate pain (pain score 4-6).    Dispense:  30 tablet    Refill:  0   gabapentin (NEURONTIN) 300 MG capsule    Sig: Take 2 capsules (600 mg total) by mouth at bedtime.    Dispense:  60 capsule    Refill:  5   HYDROcodone-acetaminophen (NORCO) 10-325 MG tablet    Sig: Take 1 tablet by mouth every 8 (eight) hours as needed for severe pain (pain score 7-10). Must last 30 days.    Dispense:  90 tablet    Refill:  0    Chronic Pain: STOP Act (Not applicable) Fill 1 day early if closed on refill date. Avoid benzodiazepines within 8 hours of opioids   cyclobenzaprine (FLEXERIL) 10 MG tablet    Sig: Take 1 tablet (10 mg total) by mouth daily as needed for muscle spasms.    Dispense:  30 tablet    Refill:  5  Continue with spinal cord stimulation, I reached out to [employer] and they will help her with reprogramming.  Orders Placed This Encounter  Procedures   ToxASSURE Select 13 (MW), Urine    Volume: 30  ml(s). Minimum 3 ml of urine is needed. Document temperature of fresh sample. Indications: Long term (current) use of  opiate analgesic (T51.761)    Order Specific Question:   Release to patient    Answer:   Immediate     Follow-up plan:   Return in about 3 months (around 10/15/2023) for MM, F2F.    I discussed the assessment and treatment plan with the patient. The patient was provided an opportunity to ask questions and all were answered. The patient agreed with the plan and demonstrated an understanding of the instructions.  Patient advised to call back or seek an in-person evaluation if the symptoms or condition worsens.  Duration of encounter: .  Total time on encounter, as per AMA guidelines included both the face-to-face and non-face-to-face time personally spent by the physician and/or other qualified health care professional(s) on the day of the encounter (includes time in activities that require the physician or other qualified health care professional and does not include time in activities normally performed by clinical staff). Physician's time may include the following activities when performed: preparing to see the patient (eg, review of tests, pre-charting review of records) obtaining and/or reviewing separately obtained history performing a medically appropriate examination and/or evaluation counseling and educating the patient/family/caregiver ordering medications, tests, or procedures referring and communicating with other health care professionals (when not separately reported) documenting clinical information in the electronic or other health record independently interpreting results (not separately reported) and communicating results to the patient/ family/caregiver care coordination (not separately reported)  Note by: Edward Jolly, MD Date: 07/15/2023; Time: 3:00 PM

## 2023-07-15 NOTE — Progress Notes (Signed)
Nursing Pain Medication Assessment:  Safety precautions to be maintained throughout the outpatient stay will include: orient to surroundings, keep bed in low position, maintain call bell within reach at all times, provide assistance with transfer out of bed and ambulation.  Medication Inspection Compliance: Pill count conducted under aseptic conditions, in front of the patient. Neither the pills nor the bottle was removed from the patient's sight at any time. Once count was completed pills were immediately returned to the patient in their original bottle.  Medication: Hydrocodone/APAP Pill/Patch Count:  7 of 30 pills remain Pill/Patch Appearance: Markings consistent with prescribed medication Bottle Appearance: Standard pharmacy container. Clearly labeled. Filled Date: 24 / 22 / 2024 Last Medication intake:  Yesterday

## 2023-07-20 LAB — TOXASSURE SELECT 13 (MW), URINE

## 2023-08-03 ENCOUNTER — Telehealth: Payer: 59 | Admitting: Family Medicine

## 2023-08-03 DIAGNOSIS — R0602 Shortness of breath: Secondary | ICD-10-CM

## 2023-08-03 NOTE — Progress Notes (Signed)
Body aches, chills, yesterday she woke up with shortness of breath, and it has increased in last 24 hours.  Hx of PNA with admission this year.  Patient acknowledged agreement and understanding of the plan.

## 2023-08-09 ENCOUNTER — Other Ambulatory Visit: Payer: Self-pay | Admitting: Internal Medicine

## 2023-08-09 DIAGNOSIS — E538 Deficiency of other specified B group vitamins: Secondary | ICD-10-CM

## 2023-08-10 ENCOUNTER — Other Ambulatory Visit: Payer: Self-pay | Admitting: Internal Medicine

## 2023-08-10 ENCOUNTER — Encounter: Payer: Self-pay | Admitting: Internal Medicine

## 2023-08-10 DIAGNOSIS — E538 Deficiency of other specified B group vitamins: Secondary | ICD-10-CM

## 2023-08-10 MED ORDER — CYANOCOBALAMIN 1000 MCG/ML IJ SOLN: 1000.0000 ug | INTRAMUSCULAR | 5 refills | Status: DC

## 2023-08-19 DIAGNOSIS — Z9884 Bariatric surgery status: Secondary | ICD-10-CM | POA: Diagnosis not present

## 2023-08-19 DIAGNOSIS — R197 Diarrhea, unspecified: Secondary | ICD-10-CM | POA: Diagnosis not present

## 2023-08-19 DIAGNOSIS — G4733 Obstructive sleep apnea (adult) (pediatric): Secondary | ICD-10-CM | POA: Diagnosis not present

## 2023-08-19 DIAGNOSIS — K219 Gastro-esophageal reflux disease without esophagitis: Secondary | ICD-10-CM | POA: Diagnosis not present

## 2023-08-19 DIAGNOSIS — K909 Intestinal malabsorption, unspecified: Secondary | ICD-10-CM | POA: Diagnosis not present

## 2023-08-19 DIAGNOSIS — R110 Nausea: Secondary | ICD-10-CM | POA: Diagnosis not present

## 2023-09-01 ENCOUNTER — Other Ambulatory Visit: Payer: Self-pay | Admitting: Internal Medicine

## 2023-09-01 DIAGNOSIS — F5101 Primary insomnia: Secondary | ICD-10-CM

## 2023-09-03 ENCOUNTER — Other Ambulatory Visit: Payer: Self-pay | Admitting: Internal Medicine

## 2023-09-03 NOTE — Telephone Encounter (Signed)
Please review.  KP

## 2023-09-03 NOTE — Telephone Encounter (Signed)
Requested medication (s) are due for refill today - yes  Requested medication (s) are on the active medication list -yes  Future visit scheduled -no  Last refill: 03/02/23 #30 5RF  Notes to clinic: non delegated Rx  Requested Prescriptions  Pending Prescriptions Disp Refills   temazepam (RESTORIL) 15 MG capsule [Pharmacy Med Name: TEMAZEPAM 15MG  CAPSULES] 30 capsule     Sig: TAKE 1 CAPSULE(15 MG) BY MOUTH AT BEDTIME AS NEEDED FOR SLEEP     Not Delegated - Psychiatry: Anxiolytics/Hypnotics 2 Failed - 09/01/2023  7:00 PM      Failed - This refill cannot be delegated      Failed - Urine Drug Screen completed in last 360 days      Failed - Valid encounter within last 6 months    Recent Outpatient Visits           8 months ago Community acquired pneumonia of left lung, unspecified part of lung   [facility], Melton Alar, [place]   10 months ago Generalized anxiety disorder   [facility] at [facility], Nyoka Cowden, [place]   11 months ago Annual physical exam   [facility] at [facility], Nyoka Cowden, [place]   11 months ago Influenza A   [place] [facility] at [facility], Nyoka Cowden, [place]   1 year ago Diarrhea of presumed infectious origin   [facility] at [facility], Nyoka Cowden, [place]              Passed - Patient is not pregnant         Requested Prescriptions  Pending Prescriptions Disp Refills   temazepam (RESTORIL) 15 MG capsule [Pharmacy Med Name: TEMAZEPAM 15MG  CAPSULES] 30 capsule     Sig: TAKE 1 CAPSULE(15 MG) BY MOUTH AT BEDTIME AS NEEDED FOR SLEEP     Not Delegated - Psychiatry: Anxiolytics/Hypnotics 2 Failed - 09/01/2023  7:00 PM      Failed - This refill cannot be delegated      Failed - Urine Drug Screen completed in last 360 days      Failed - Valid encounter  within last 6 months    Recent Outpatient Visits           8 months ago Community acquired pneumonia of left lung, unspecified part of lung   [facility] at [facility], Melton Alar, [place]   10 months ago Generalized anxiety disorder   [facility] at [facility], Nyoka Cowden, [place]   11 months ago Annual physical exam   [facility] at [facility], Nyoka Cowden, MD   11 months ago Influenza A   [facility] at [facility], Nyoka Cowden, MD   1 year ago Diarrhea of presumed infectious origin   [facility] at [facility], Nyoka Cowden, MD              Passed - Patient is not pregnant

## 2023-10-02 DIAGNOSIS — Z9884 Bariatric surgery status: Secondary | ICD-10-CM | POA: Diagnosis not present

## 2023-10-02 DIAGNOSIS — K909 Intestinal malabsorption, unspecified: Secondary | ICD-10-CM | POA: Diagnosis not present

## 2023-10-02 DIAGNOSIS — R197 Diarrhea, unspecified: Secondary | ICD-10-CM | POA: Diagnosis not present

## 2023-10-02 DIAGNOSIS — K9189 Other postprocedural complications and disorders of digestive system: Secondary | ICD-10-CM | POA: Diagnosis not present

## 2023-10-03 ENCOUNTER — Other Ambulatory Visit: Payer: Self-pay | Admitting: Student in an Organized Health Care Education/Training Program

## 2023-10-03 DIAGNOSIS — M1711 Unilateral primary osteoarthritis, right knee: Secondary | ICD-10-CM | POA: Diagnosis not present

## 2023-10-03 DIAGNOSIS — S8001XA Contusion of right knee, initial encounter: Secondary | ICD-10-CM | POA: Diagnosis not present

## 2023-10-14 ENCOUNTER — Ambulatory Visit
Payer: 59 | Attending: Student in an Organized Health Care Education/Training Program | Admitting: Student in an Organized Health Care Education/Training Program

## 2023-10-14 ENCOUNTER — Encounter: Payer: Self-pay | Admitting: Student in an Organized Health Care Education/Training Program

## 2023-10-14 VITALS — BP 142/95 | Temp 97.2°F | Ht 67.0 in | Wt 203.0 lb

## 2023-10-14 DIAGNOSIS — M961 Postlaminectomy syndrome, not elsewhere classified: Secondary | ICD-10-CM | POA: Diagnosis not present

## 2023-10-14 DIAGNOSIS — Z9689 Presence of other specified functional implants: Secondary | ICD-10-CM | POA: Diagnosis not present

## 2023-10-14 DIAGNOSIS — G8929 Other chronic pain: Secondary | ICD-10-CM | POA: Insufficient documentation

## 2023-10-14 DIAGNOSIS — Z79891 Long term (current) use of opiate analgesic: Secondary | ICD-10-CM

## 2023-10-14 DIAGNOSIS — G894 Chronic pain syndrome: Secondary | ICD-10-CM | POA: Diagnosis not present

## 2023-10-14 DIAGNOSIS — Z981 Arthrodesis status: Secondary | ICD-10-CM

## 2023-10-14 DIAGNOSIS — M5416 Radiculopathy, lumbar region: Secondary | ICD-10-CM | POA: Diagnosis not present

## 2023-10-14 MED ORDER — CYCLOBENZAPRINE HCL 10 MG PO TABS: 10.0000 mg | ORAL_TABLET | Freq: Every day | ORAL | 5 refills | Status: DC | PRN

## 2023-10-14 MED ORDER — HYDROCODONE-ACETAMINOPHEN 10-325 MG PO TABS: 1.0000 | ORAL_TABLET | Freq: Three times a day (TID) | ORAL | 0 refills | Status: AC | PRN

## 2023-10-14 MED ORDER — GABAPENTIN 300 MG PO CAPS
600.0000 mg | ORAL_CAPSULE | Freq: Every day | ORAL | 5 refills | Status: DC
Start: 2023-10-14 — End: 2024-03-28

## 2023-10-14 NOTE — Progress Notes (Signed)
PROVIDER NOTE: Information contained herein reflects review and annotations entered in association with encounter. Interpretation of such information and data should be left to medically-trained personnel. Information provided to patient can be located elsewhere in the medical record under "Patient Instructions". Document created using STT-dictation technology, any transcriptional errors that may result from process are unintentional.    Patient: Kimberly Cowan  Service Category: E/M  Provider: Edward Jolly, MD  DOB: 05/10/1966  DOS: 10/14/2023  Referring Provider: Reubin Milan, MD  MRN: [id number]  Specialty: Interventional Pain Management  PCP: Reubin Milan, MD  Type: Established Patient  Setting: Ambulatory outpatient    Location: Office  Delivery: Face-to-face     HPI  [name], a 58 y.o. 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 [name] primary complain today is Back Pain (lower) Last encounter: My last encounter with her was on 07/15/23 Pertinent problems: [name] has Failed back surgical syndrome; Spinal stenosis of lumbar region; Chronic depression; Spinal cord stimulator status [employer], implanted 2017); History of lumbar fusion (L5-S1 PSIF); Encounter for long-term opiate analgesic use; Chronic pain syndrome; and Pain management contract signed on their pertinent problem list. Pain Assessment: Severity of Chronic pain is reported as a 7 /10. Location: Back Left, Right/pain radiaties down both leg toher feet. Onset: More than a month ago. Quality: Burning, Aching, Constant, Throbbing, Discomfort. Timing: Constant. Modifying factor(s): meds, heat, laying dowm. Vitals:  height is 5\' 7"  (1.702 m) and weight is 203 lb (92.1 kg). Her temperature is 97.2 F (36.2 C) (abnormal). Her blood pressure is 142/95 (abnormal). Her oxygen saturation is 98%.   Reason for encounter: medication  management.   No change in medical history since last visit.  Patient's pain is at baseline.  Patient continues multimodal pain regimen as prescribed.  States that it provides pain relief and improvement in functional status.  Continues to utilize her spinal cord stimulator.    12/24/22 Was recently hospitalized for PNA- IV abx and IV fluids. Was on oxygen at the hospital at 4L, weaned down at discharge. Patient's pain is at baseline.  Patient continues multimodal pain regimen as prescribed.  States that it provides pain relief and improvement in functional status.  05/07/22: Second patient visit today.  She has completed her urine toxicology screen which is appropriate.  I will have her sign pain contract and take her on for chronic pain management.  I will send in a prescription for hydrocodone 7.5 mg nightly.  I will see her back in 4 weeks for medication management.   Please see initial clinic visit HPI below Kimberly Cowan is a pleasant 58 year old female with a history of L4-S1 spinal fusion with [employer] spinal cord stimulator in place who is hoping to establish with pain management as her previous pain provider left [facility] and is no longer excepting new patients.  She states that she has done physical therapy in the past tried various medications as well as injections with limited response.  She had her spinal cord stimulator implanted in 2017 with [facility] which was beneficial for her and allowed her to wean her hydrocodone to just once at night.  She states that she is able to manage with 7.5 mg nightly.  She is hoping to have this continued.  She also has restless leg syndrome and obesity.  She also has obstructive sleep apnea.  Given that she is on low-dose and has been compliant with  previous pain clinic protocol, I will have her obtain baseline urine toxicology screen which is customary for new patients and I will see her back in approximately 7 to 10 days to sign  pain contract and take her on for medication   Pharmacotherapy Assessment  Analgesic:  Hydrocodone 7.5 mg nightly as needed  Monitoring: [place] PMP: PDMP reviewed during this encounter.       Pharmacotherapy: No side-effects or adverse reactions reported. Compliance: No problems identified. Effectiveness: Clinically acceptable.  Brigitte Pulse, RN  10/14/2023  2:47 PM  Sign when Signing Visit Nursing Pain Medication Assessment:  Safety precautions to be maintained throughout the outpatient stay will include: orient to surroundings, keep bed in low position, maintain call bell within reach at all times, provide assistance with transfer out of bed and ambulation.  Medication Inspection Compliance: [name] did not comply with our request to bring her pills to be counted. She was reminded that bringing the medication bottles, even when empty, is a requirement.  Medication: None brought in. Pill/Patch Count: None available to be counted. Bottle Appearance: No container available. Did not bring bottle(s) to appointment. Filled Date: N/A Last Medication intake:  YesterdaySafety precautions to be maintained throughout the outpatient stay will include: orient to surroundings, keep bed in low position, maintain call bell within reach at all times, provide assistance with transfer out of bed and ambulation.   Pt accident throw away her meds bottle but she brought in 5 pills.    No results found for: "CBDTHCR" No results found for: "D8THCCBX" No results found for: "D9THCCBX"  UDS:  Summary  Date Value Ref Range Status  07/15/2023 Note  Final    Comment:    ==================================================================== [employer] [facility] 13 (MW) ==================================================================== Test                             Result       Flag       Units  Drug Present and Declared for Prescription Verification   Oxazepam                       762           EXPECTED   ng/mg creat   Temazepam                      >2597        EXPECTED   ng/mg creat    Oxazepam and temazepam are expected metabolites of diazepam.    Oxazepam is also an expected metabolite of other benzodiazepine    drugs, including chlordiazepoxide, prazepam, clorazepate, halazepam,    and temazepam.  Oxazepam and temazepam are available as scheduled    prescription medications.    Hydrocodone                    199          EXPECTED   ng/mg creat   Hydromorphone                  71           EXPECTED   ng/mg creat   Dihydrocodeine                 70           EXPECTED   ng/mg creat   Norhydrocodone  303          EXPECTED   ng/mg creat    Sources of hydrocodone include scheduled prescription medications.    Hydromorphone, dihydrocodeine and norhydrocodone are expected    metabolites of hydrocodone. Hydromorphone and dihydrocodeine are    also available as scheduled prescription medications.  ==================================================================== Test                      Result    Flag   Units      Ref Range   Creatinine              77               mg/dL      >=16 ==================================================================== Declared Medications:  The flagging and interpretation on this report are based on the  following declared medications.  Unexpected results may arise from  inaccuracies in the declared medications.   **Note: The testing scope of this panel includes these medications:   Hydrocodone (Norco)  Temazepam (Restoril)   **Note: The testing scope of this panel does not include the  following reported medications:   Acetaminophen (Norco)  Atropine (Lomotil)  Colestipol (Colestid)  Cyclobenzaprine (Flexeril)  Diphenoxylate (Lomotil)  Fluticasone (Flonase)  Furosemide (Lasix)  Gabapentin  Latanoprost (Xalatan)  Metronidazole (Flagyl)  Multivitamin  Pantoprazole (Protonix)  Potassium (Klor-Con)  Rizatriptan  (Maxalt)  Sertraline (Zoloft)  Vitamin B12 ==================================================================== For clinical consultation, please call [phone]. ====================================================================       ROS  Constitutional: Denies any fever or chills Gastrointestinal: No reported hemesis, hematochezia, vomiting, or acute GI distress Musculoskeletal:  + LBP Neurological: No reported episodes of acute onset apraxia, aphasia, dysarthria, agnosia, amnesia, paralysis, loss of coordination, or loss of consciousness  Medication Review  HYDROcodone-acetaminophen, Multiple Vitamins-Minerals, colestipol, cyanocobalamin, cyclobenzaprine, diphenoxylate-atropine, fluticasone, furosemide, gabapentin, latanoprost, pantoprazole, potassium chloride, rizatriptan, sertraline, and temazepam  History Review  Allergy: [name] is allergic to morphine and codeine, penicillins, latex, and baclofen. Drug: [name]  reports no history of drug use. Alcohol:  reports no history of alcohol use. Tobacco:  reports that she has never smoked. She has never used smokeless tobacco. Social: [name]  reports that she has never smoked. She has never used smokeless tobacco. She reports that she does not drink alcohol and does not use drugs. Medical:  has a past medical history of Allergy (1999), Anemia, Anxiety, Arm pain, left (05/17/2018), Arthritis, Bilateral lumbar radiculopathy, Cellulitis of abdominal wall (03/03/2021), Chronic back pain, Chronic GERD (03/03/2021), Clinical depression (04/04/2012), Depression, DVT (deep venous thrombosis) (HCC), Encounter for screening colonoscopy, Essential (primary) hypertension (06/04/2020), Glaucoma, Headache(784.0), Major depressive disorder with single episode, in full remission (HCC) (08/15/2018), Malfunction of jejunostomy tube (HCC) (02/13/2021), Ovarian cyst, Pneumonia, Pulmonary embolism (HCC) (05/2017), S/P laparoscopic  cholecystectomy (02/07/2021), and Sleep apnea. Surgical: [name]  has a past surgical history that includes Tonsillectomy; Gastric bypass (2005); Reduction mammaplasty (Bilateral); Spinal cord stimulator insertion (N/A, 03/20/2016); Abdominal hysterectomy (10/2006); Laparoscopic salpingo oophorectomy (Bilateral, 10/05/2016); Back surgery (2014); Back surgery (2009); Colonoscopy with propofol (N/A, 04/12/2018); Cesarean section (1989 1995); Spine surgery (2009 2014 2017); Knee arthroscopy with lateral menisectomy (Left, 05/27/2017); Knee arthroscopy with lateral release (Left, 05/27/2017); Bariatric Surgery (07/2020); and Cholecystectomy (01/2021). Family: family history includes Arthritis in her father, mother, sister, and sister; Cancer in her father and maternal aunt; Diabetes in her maternal aunt, maternal grandmother, mother, and sister; Hypertension in her father, mother, and sister; Obesity in her daughter, father, mother, sister, and  sister; Varicose Veins in her mother.  Laboratory Chemistry Profile   Renal Lab Results  Component Value Date   BUN 8 12/10/2022   CREATININE 0.57 12/10/2022   BCR 17 10/08/2022   GFRAA 100 07/24/2020   GFRNONAA >60 12/10/2022    Hepatic Lab Results  Component Value Date   AST 15 12/07/2022   ALT 15 12/07/2022   ALBUMIN 2.9 (L) 12/07/2022   ALKPHOS 61 12/07/2022   LIPASE 25 12/07/2022    Electrolytes Lab Results  Component Value Date   NA 139 12/10/2022   K 3.6 12/10/2022   CL 110 12/10/2022   CALCIUM 9.0 12/10/2022   MG 2.0 12/07/2022   PHOS 2.8 12/08/2022    Bone Lab Results  Component Value Date   VD25OH 9 02/19/2022    Inflammation (CRP: Acute Phase) (ESR: Chronic Phase) Lab Results  Component Value Date   LATICACIDVEN 1.6 12/08/2022         Note: Above Lab results reviewed.  Recent Imaging Review  MM 3D SCREEN BREAST BILATERAL CLINICAL DATA:  Screening.  EXAM: DIGITAL SCREENING BILATERAL MAMMOGRAM WITH TOMOSYNTHESIS  AND CAD  TECHNIQUE: Bilateral screening digital craniocaudal and mediolateral oblique mammograms were obtained. Bilateral screening digital breast tomosynthesis was performed. The images were evaluated with computer-aided detection.  COMPARISON:  Previous exam(s).  ACR Breast Density Category b: There are scattered areas of fibroglandular density.  FINDINGS: There are no findings suspicious for malignancy.  IMPRESSION: No mammographic evidence of malignancy. A result letter of this screening mammogram will be mailed directly to the patient.  RECOMMENDATION: Screening mammogram in one year. (Code:SM-B-01Y)  BI-RADS CATEGORY  1: Negative.  Electronically Signed   By: Jacob Moores M.D.   On: 01/26/2023 09:17 Note: Reviewed        Physical Exam  General appearance: Well nourished, well developed, and well hydrated. In no apparent acute distress Mental status: Alert, oriented x 3 (person, place, & time)       Respiratory: No evidence of acute respiratory distress Eyes: PERLA Vitals: BP (!) 142/95   Temp (!) 97.2 F (36.2 C)   Ht 5\' 7"  (1.702 m)   Wt 203 lb (92.1 kg)   SpO2 98%   BMI 31.79 kg/m  BMI: Estimated body mass index is 31.79 kg/m as calculated from the following:   Height as of this encounter: 5\' 7"  (1.702 m).   Weight as of this encounter: 203 lb (92.1 kg). Ideal: Ideal body weight: 61.6 kg (135 lb 12.9 oz) Adjusted ideal body weight: 73.8 kg (162 lb 10.9 oz)    Thoracic Spine Area Exam  Skin & Axial Inspection: Well healed scar from previous spine surgery detected Alignment: Symmetrical Functional ROM: Pain restricted ROM Stability: No instability detected Muscle Tone/Strength: Functionally intact. No obvious neuro-muscular anomalies detected. Sensory (Neurological): Neurogenic pain pattern Muscle strength & Tone: No palpable anomalies Lumbar Spine Area Exam  Skin & Axial Inspection: Well healed scar from previous spine surgery detected spinal  cord stimulator IPG  along the left flank Alignment: Symmetrical Functional ROM: Pain restricted ROM       Stability: No instability detected Muscle Tone/Strength: Functionally intact. No obvious neuro-muscular anomalies detected. Sensory (Neurological): Dermatomal pain pattern   Gait & Posture Assessment  Ambulation: Limited Gait: Antalgic gait (limping) Posture: Difficulty standing up straight, due to pain  Lower Extremity Exam      Side: Right lower extremity   Side: Left lower extremity  Stability: No instability observed  Stability: No instability observed          Skin & Extremity Inspection: Skin color, temperature, and hair growth are WNL. No peripheral edema or cyanosis. No masses, redness, swelling, asymmetry, or associated skin lesions. No contractures.   Skin & Extremity Inspection: Skin color, temperature, and hair growth are WNL. No peripheral edema or cyanosis. No masses, redness, swelling, asymmetry, or associated skin lesions. No contractures.  Functional ROM: Unrestricted ROM                   Functional ROM: Unrestricted ROM                  Muscle Tone/Strength: Functionally intact. No obvious neuro-muscular anomalies detected.   Muscle Tone/Strength: Functionally intact. No obvious neuro-muscular anomalies detected.  Sensory (Neurological): Musculoskeletal pain pattern         Sensory (Neurological): Musculoskeletal pain pattern        DTR: Patellar: deferred today Achilles: deferred today Plantar: deferred today   DTR: Patellar: deferred today Achilles: deferred today Plantar: deferred today  Palpation: No palpable anomalies   Palpation: No palpable anomalies       Assessment   Diagnosis Status  1. Failed back surgical syndrome   2. Spinal cord stimulator status   3. Chronic radicular lumbar pain   4. History of lumbar fusion   5. Encounter for long-term opiate analgesic use   6. Chronic pain syndrome     Controlled Controlled Controlled      Plan of Care    [name] has a current medication list which includes the following long-term medication(s): colestipol, fluticasone, furosemide, rizatriptan, sertraline, temazepam, and gabapentin.  Pharmacotherapy (Medications Ordered): Meds ordered this encounter  Medications   HYDROcodone-acetaminophen (NORCO) 10-325 MG tablet    Sig: Take 1 tablet by mouth every 8 (eight) hours as needed for severe pain (pain score 7-10). Must last 30 days.    Dispense:  90 tablet    Refill:  0    Chronic Pain: STOP Act (Not applicable) Fill 1 day early if closed on refill date. Avoid benzodiazepines within 8 hours of opioids   gabapentin (NEURONTIN) 300 MG capsule    Sig: Take 2 capsules (600 mg total) by mouth at bedtime.    Dispense:  60 capsule    Refill:  5   cyclobenzaprine (FLEXERIL) 10 MG tablet    Sig: Take 1 tablet (10 mg total) by mouth daily as needed for muscle spasms.    Dispense:  30 tablet    Refill:  5  Continue with spinal cord stimulation, I reached out to [employer] and they will help her with reprogramming.  No orders of the defined types were placed in this encounter.    Follow-up plan:   Return in about 3 months (around 01/12/2024) for MM, F2F.    I discussed the assessment and treatment plan with the patient. The patient was provided an opportunity to ask questions and all were answered. The patient agreed with the plan and demonstrated an understanding of the instructions.  Patient advised to call back or seek an in-person evaluation if the symptoms or condition worsens.  Duration of encounter: .  Total time on encounter, as per AMA guidelines included both the face-to-face and non-face-to-face time personally spent by the physician and/or other qualified health care professional(s) on the day of the encounter (includes time in activities that require the physician or other qualified health care professional and does not  include time in activities normally performed by clinical staff). Physician's time may include the following activities when performed: preparing to see the patient (eg, review of tests, pre-charting review of records) obtaining and/or reviewing separately obtained history performing a medically appropriate examination and/or evaluation counseling and educating the patient/family/caregiver ordering medications, tests, or procedures referring and communicating with other health care professionals (when not separately reported) documenting clinical information in the electronic or other health record independently interpreting results (not separately reported) and communicating results to the patient/ family/caregiver care coordination (not separately reported)  Note by: Edward Jolly, MD Date: 10/14/2023; Time: 3:03 PM

## 2023-10-14 NOTE — Progress Notes (Signed)
Nursing Pain Medication Assessment:  Safety precautions to be maintained throughout the outpatient stay will include: orient to surroundings, keep bed in low position, maintain call bell within reach at all times, provide assistance with transfer out of bed and ambulation.  Medication Inspection Compliance: Ms. Lauree Chandler did not comply with our request to bring her pills to be counted. She was reminded that bringing the medication bottles, even when empty, is a requirement.  Medication: None brought in. Pill/Patch Count: None available to be counted. Bottle Appearance: No container available. Did not bring bottle(s) to appointment. Filled Date: N/A Last Medication intake:  YesterdaySafety precautions to be maintained throughout the outpatient stay will include: orient to surroundings, keep bed in low position, maintain call bell within reach at all times, provide assistance with transfer out of bed and ambulation.   Pt accident throw away her meds bottle but she brought in 5 pills.

## 2023-11-04 ENCOUNTER — Other Ambulatory Visit: Payer: Self-pay | Admitting: Internal Medicine

## 2023-11-04 DIAGNOSIS — F5101 Primary insomnia: Secondary | ICD-10-CM

## 2023-11-05 ENCOUNTER — Encounter: Payer: Self-pay | Admitting: Internal Medicine

## 2023-11-05 ENCOUNTER — Other Ambulatory Visit: Payer: Self-pay | Admitting: Internal Medicine

## 2023-11-05 NOTE — Telephone Encounter (Signed)
 Requested medications are due for refill today.  yes  Requested medications are on the active medications list.  yes  Last refill. 09/03/2023 #30 1 rf  Future visit scheduled.   no  Notes to clinic.  Refill/refusal not delegated. Pt is more than 3 months over due for an OV.    Requested Prescriptions  Pending Prescriptions Disp Refills   temazepam  (RESTORIL ) 15 MG capsule [Pharmacy Med Name: TEMAZEPAM  15MG  CAPSULES] 30 capsule     Sig: TAKE 1 CAPSULE(15 MG) BY MOUTH AT BEDTIME AS NEEDED FOR SLEEP     Not Delegated - Psychiatry: Anxiolytics/Hypnotics 2 Failed - 11/05/2023 11:29 AM      Failed - This refill cannot be delegated      Failed - Urine Drug Screen completed in last 360 days      Failed - Valid encounter within last 6 months    Recent Outpatient Visits           10 months ago Community acquired pneumonia of left lung, unspecified part of lung   [facility] at [facility], Toribio SQUIBB, [place]   12 months ago Generalized anxiety disorder   [facility] at [facility], Leita DEL, [place]   1 year ago Annual physical exam   [facility] at [facility], Leita DEL, [place]   1 year ago Influenza A   [place] [facility] at [facility], Leita DEL, [place]   2 years ago Diarrhea of presumed infectious origin   [facility] at [facility], Leita DEL, [place]              Passed - Patient is not pregnant

## 2023-11-12 DIAGNOSIS — E611 Iron deficiency: Secondary | ICD-10-CM | POA: Diagnosis not present

## 2023-11-12 DIAGNOSIS — Z885 Allergy status to narcotic agent status: Secondary | ICD-10-CM | POA: Diagnosis not present

## 2023-11-12 DIAGNOSIS — Z833 Family history of diabetes mellitus: Secondary | ICD-10-CM | POA: Diagnosis not present

## 2023-11-12 DIAGNOSIS — Z8249 Family history of ischemic heart disease and other diseases of the circulatory system: Secondary | ICD-10-CM | POA: Diagnosis not present

## 2023-11-12 DIAGNOSIS — R1013 Epigastric pain: Secondary | ICD-10-CM | POA: Diagnosis not present

## 2023-11-12 DIAGNOSIS — Z9884 Bariatric surgery status: Secondary | ICD-10-CM | POA: Diagnosis not present

## 2023-11-12 DIAGNOSIS — K219 Gastro-esophageal reflux disease without esophagitis: Secondary | ICD-10-CM | POA: Diagnosis not present

## 2023-11-12 DIAGNOSIS — M1991 Primary osteoarthritis, unspecified site: Secondary | ICD-10-CM | POA: Diagnosis not present

## 2023-11-12 DIAGNOSIS — K3189 Other diseases of stomach and duodenum: Secondary | ICD-10-CM | POA: Diagnosis not present

## 2023-11-12 DIAGNOSIS — R1319 Other dysphagia: Secondary | ICD-10-CM | POA: Diagnosis not present

## 2023-11-12 DIAGNOSIS — Z880 Allergy status to penicillin: Secondary | ICD-10-CM | POA: Diagnosis not present

## 2023-11-12 DIAGNOSIS — G4733 Obstructive sleep apnea (adult) (pediatric): Secondary | ICD-10-CM | POA: Diagnosis not present

## 2023-11-12 DIAGNOSIS — Z86711 Personal history of pulmonary embolism: Secondary | ICD-10-CM | POA: Diagnosis not present

## 2023-11-12 DIAGNOSIS — K9189 Other postprocedural complications and disorders of digestive system: Secondary | ICD-10-CM | POA: Diagnosis not present

## 2023-11-12 DIAGNOSIS — Z8349 Family history of other endocrine, nutritional and metabolic diseases: Secondary | ICD-10-CM | POA: Diagnosis not present

## 2023-11-12 DIAGNOSIS — G8929 Other chronic pain: Secondary | ICD-10-CM | POA: Diagnosis not present

## 2023-11-12 DIAGNOSIS — Z86718 Personal history of other venous thrombosis and embolism: Secondary | ICD-10-CM | POA: Diagnosis not present

## 2023-11-30 DIAGNOSIS — R197 Diarrhea, unspecified: Secondary | ICD-10-CM | POA: Diagnosis not present

## 2023-11-30 DIAGNOSIS — K9189 Other postprocedural complications and disorders of digestive system: Secondary | ICD-10-CM | POA: Diagnosis not present

## 2023-11-30 DIAGNOSIS — Z9884 Bariatric surgery status: Secondary | ICD-10-CM | POA: Diagnosis not present

## 2023-11-30 DIAGNOSIS — K909 Intestinal malabsorption, unspecified: Secondary | ICD-10-CM | POA: Diagnosis not present

## 2023-12-01 ENCOUNTER — Ambulatory Visit (INDEPENDENT_AMBULATORY_CARE_PROVIDER_SITE_OTHER): Payer: 59

## 2023-12-01 DIAGNOSIS — Z0000 Encounter for general adult medical examination without abnormal findings: Secondary | ICD-10-CM

## 2023-12-01 DIAGNOSIS — Z1231 Encounter for screening mammogram for malignant neoplasm of breast: Secondary | ICD-10-CM

## 2023-12-01 DIAGNOSIS — Z599 Problem related to housing and economic circumstances, unspecified: Secondary | ICD-10-CM

## 2023-12-01 NOTE — Patient Instructions (Addendum)
 [name] , Thank you for taking time to come for your Medicare Wellness Visit. I appreciate your ongoing commitment to your health goals. Please review the following plan we discussed and let me know if I can assist you in the future.   Referrals/Orders/Follow-Ups/Clinician Recommendations: REFERRAL SENT FOR MAMMOGRAM  .You have an order for:  []   2D Mammogram  [x]   3D Mammogram  []   Bone Density     Please call for appointment:  [facility]  [address] [place] [address] [phone] [facility] [address] [place], [place] [address] [phone] [facility] [address] Geanie Logan [place], [place] [address] [phone]   Make sure to wear two-piece clothing.  No lotions, powders, or deodorants the day of the appointment. Make sure to bring picture ID and insurance card.  Bring list of medications you are currently taking including any supplements.   Schedule your [facility] screening mammogram through MyChart!   Log into your MyChart account.  Go to 'Visit' (or 'Appointments' if on mobile App) --> Schedule an Appointment  Under 'Select a Reason for Visit' choose the Mammogram Screening option.  Complete the pre-visit questions and select the time and place that best fits your schedule.   This is a list of the screening recommended for you and due dates:  Health Maintenance  Topic Date Due   Hepatitis C Screening  Never done   COVID-19 Vaccine (6 - 2024-25 season) 05/30/2023   Flu Shot  12/27/2023*   Mammogram  01/22/2024   DTaP/Tdap/Td vaccine (4 - Td or Tdap) 04/24/2024   Medicare Annual Wellness Visit  11/30/2024   Colon Cancer Screening  04/12/2028   HIV Screening  Completed   Zoster (Shingles) Vaccine  Completed   Pneumococcal Vaccination  Aged Out   HPV Vaccine  Aged Out  *Topic was postponed. The date shown is not the  original due date.    Advanced directives: (ACP Link)Information on Advanced Care Planning can be found at [facility] [place] Advance Health Care Directives Advance Health Care Directives ([email or web address]   Next Medicare Annual Wellness Visit scheduled for next year: Yes   12/06/24 @ 10:10 AM BY VIDEO

## 2023-12-01 NOTE — Progress Notes (Signed)
 Subjective:   Kimberly Cowan is a 58 y.o. who presents for a Medicare Wellness preventive visit.  Visit Complete: Virtual I connected with  Kimberly Cowan on 12/01/23 by a video and audio enabled telemedicine application and verified that I am speaking with the correct person using two identifiers.  Patient Location: Home  Provider Location: Office/Clinic  I discussed the limitations of evaluation and management by telemedicine. The patient expressed understanding and agreed to proceed.  Vital Signs: Because this visit was a virtual/telehealth visit, some criteria may be missing or patient reported. Any vitals not documented were not able to be obtained and vitals that have been documented are patient reported.   AWV Questionnaire: No: Patient Medicare AWV questionnaire was not completed prior to this visit.  Cardiac Risk Factors include: hypertension;obesity (BMI >30kg/m2)     Objective:    Today's Vitals   12/01/23 1116  PainSc: 4    There is no height or weight on file to calculate BMI.     12/01/2023   11:23 AM 07/15/2023    2:11 PM 07/05/2023    6:48 PM 12/24/2022    1:47 PM 12/07/2022   11:00 AM 11/25/2022    3:33 PM 05/07/2022    2:52 PM  Advanced Directives  Does Patient Have a Medical Advance Directive? No Yes No No No No No  Does patient want to make changes to medical advance directive?  No - Patient declined  No - Patient declined     Would patient like information on creating a medical advance directive? No - Patient declined  No - Patient declined  No - Patient declined No - Patient declined No - Patient declined    Current Medications (verified) Outpatient Encounter Medications as of 12/01/2023  Medication Sig   cyanocobalamin (VITAMIN B12) 1000 MCG/ML injection Inject 1 mL (1,000 mcg total) into the muscle every 30 (thirty) days.   cyclobenzaprine (FLEXERIL) 10 MG tablet Take 1 tablet (10 mg total) by mouth daily as needed for  muscle spasms.   diphenoxylate-atropine (LOMOTIL) 2.5-0.025 MG tablet Take 1 tablet by mouth 4 (four) times daily as needed.   fluticasone (FLONASE) 50 MCG/ACT nasal spray Place 1 spray into both nostrils daily as needed for allergies.   furosemide (LASIX) 20 MG tablet TAKE 1 TABLET(20 MG) BY MOUTH DAILY   gabapentin (NEURONTIN) 300 MG capsule Take 2 capsules (600 mg total) by mouth at bedtime.   latanoprost (XALATAN) 0.005 % ophthalmic solution Place 1 drop into both eyes at bedtime.    Multiple Vitamins-Minerals (MULTIVITAMIN GUMMIES ADULTS PO) Take 1 tablet by mouth daily.   pantoprazole (PROTONIX) 40 MG tablet Take 40 mg by mouth 2 (two) times daily.   potassium chloride (KLOR-CON) 10 MEQ tablet Take 10 mEq by mouth 2 (two) times daily.   rizatriptan (MAXALT-MLT) 10 MG disintegrating tablet DISSOLVE 1 TABLET BY MOUTH AS NEEDED FOR MIGRAINE. MAY REPEAT IN 2 HOURS AS NEEDED   sertraline (ZOLOFT) 100 MG tablet Take 1 tablet (100 mg total) by mouth daily.   temazepam (RESTORIL) 15 MG capsule TAKE 1 CAPSULE(15 MG) BY MOUTH AT BEDTIME AS NEEDED FOR SLEEP   colestipol (COLESTID) 1 g tablet Take 1 g by mouth 2 (two) times daily. (Patient not taking: Reported on 12/01/2023)   No facility-administered encounter medications on file as of 12/01/2023.    Allergies (verified) Morphine and codeine, Penicillins, Latex, and Baclofen   History: Past Medical History:  Diagnosis Date   Allergy 1999  SEASONAL   Anemia    Anxiety    Arm pain, left 05/17/2018   Arthritis    lower spine   Bilateral lumbar radiculopathy    Cellulitis of abdominal wall 03/03/2021   Chronic back pain    Chronic GERD 03/03/2021   Clinical depression 04/04/2012   Depression    DVT (deep venous thrombosis) (HCC)    Encounter for screening colonoscopy    Essential (primary) hypertension 06/04/2020   Glaucoma    RIGHT EYE   Headache(784.0)    migraines   Major depressive disorder with single episode, in full remission  (HCC) 08/15/2018   Malfunction of jejunostomy tube (HCC) 02/13/2021   Ovarian cyst    Pneumonia    walking pneumonia   Pulmonary embolism (HCC) 05/2017   Provoked, following knee surgery   S/P laparoscopic cholecystectomy 02/07/2021   Sleep apnea    no cpcap     last sleep study > 4 yrs   Past Surgical History:  Procedure Laterality Date   ABDOMINAL HYSTERECTOMY  10/2006   BACK SURGERY  2014   lumbar fusion   BACK SURGERY  2009   discectomy   BARIATRIC SURGERY  07/2020   Revision   CESAREAN SECTION  1989 1995   CHOLECYSTECTOMY  01/2021   COLONOSCOPY WITH PROPOFOL N/A 04/12/2018   Procedure: COLONOSCOPY WITH PROPOFOL;  Surgeon: Toney Reil, MD;  Location: [facility];  Service: Gastroenterology;  Laterality: N/A;   GASTRIC BYPASS  2005   KNEE ARTHROSCOPY WITH LATERAL MENISECTOMY Left 05/27/2017   Procedure: KNEE ARTHROSCOPY WITH LATERAL MENISECTOMY;  Surgeon: Kennedy Bucker, MD;  Location: [facility];  Service: Orthopedics;  Laterality: Left;   KNEE ARTHROSCOPY WITH LATERAL RELEASE Left 05/27/2017   Procedure: KNEE ARTHROSCOPY WITH LATERAL RELEASE;  Surgeon: Kennedy Bucker, MD;  Location: [facility];  Service: Orthopedics;  Laterality: Left;   LAPAROSCOPIC SALPINGO OOPHERECTOMY Bilateral 10/05/2016   Procedure: LAPAROSCOPIC SALPINGO OOPHORECTOMY;  Surgeon: Herold Harms, MD;  Location: [facility];  Service: Gynecology;  Laterality: Bilateral;   REDUCTION MAMMAPLASTY Bilateral    2001   SPINAL CORD STIMULATOR INSERTION N/A 03/20/2016   Procedure: LUMBAR SPINAL CORD STIMULATOR INSERTION;  Surgeon: Tia Alert, MD;  Location: [facility];  Service: Neurosurgery;  Laterality: N/A;   SPINE SURGERY  2009 2014 2017   TONSILLECTOMY     Family History  Problem Relation Age of Onset   Diabetes Mother    Hypertension Mother    Obesity Mother    Arthritis Mother    Varicose Veins Mother    Diabetes Sister    Hypertension Sister    Arthritis Sister    Hypertension  Father    Arthritis Father    Cancer Father    Obesity Father    Obesity Daughter    Obesity Sister    Arthritis Sister    Obesity Sister    Diabetes Maternal Grandmother    Cancer Maternal Aunt    Diabetes Maternal Aunt    Breast cancer Neg Hx    Social History   Socioeconomic History   Marital status: Widowed    Spouse name: Not on file   Number of children: 2   Years of education: some college   Highest education level: Bachelor's degree (e.g., BA, AB, BS)  Occupational History   Occupation: disabled  Tobacco Use   Smoking status: Never   Smokeless tobacco: Never   Tobacco comments:    smoking cessation materials not required  Vaping Use  Vaping status: Never Used  Substance and Sexual Activity   Alcohol use: No   Drug use: No   Sexual activity: Not Currently    Birth control/protection: Surgical  Other Topics Concern   Not on file  Social History Narrative   Pt lives alone   Social Drivers of Health   Financial Resource Strain: High Risk (12/01/2023)   Overall Financial Resource Strain (CARDIA)    Difficulty of Paying Living Expenses: Hard  Food Insecurity: No Food Insecurity (12/01/2023)   Hunger Vital Sign    Worried About Running Out of Food in the Last Year: Never true    Ran Out of Food in the Last Year: Never true  Recent Concern: Food Insecurity - Food Insecurity Present (11/29/2023)   Hunger Vital Sign    Worried About Running Out of Food in the Last Year: Sometimes true    Ran Out of Food in the Last Year: Sometimes true  Transportation Needs: No Transportation Needs (12/01/2023)   PRAPARE - Administrator, Civil Service (Medical): No    Lack of Transportation (Non-Medical): No  Physical Activity: Sufficiently Active (12/01/2023)   Exercise Vital Sign    Days of Exercise per Week: 7 days    Minutes of Exercise per Session: 60 min  Stress: No Stress Concern Present (12/01/2023)   [employer] of Occupational Health - Occupational Stress  Questionnaire    Feeling of Stress : Only a little  Recent Concern: Stress - Stress Concern Present (11/29/2023)   [employer] of Occupational Health - Occupational Stress Questionnaire    Feeling of Stress : To some extent  Social Connections: Moderately Isolated (12/01/2023)   Social Connection and Isolation Panel [NHANES]    Frequency of Communication with Friends and Family: More than three times a week    Frequency of Social Gatherings with Friends and Family: Once a week    Attends Religious Services: More than 4 times per year    Active Member of [employer] or Organizations: No    Attends Banker Meetings: Never    Marital Status: Widowed    Tobacco Counseling Counseling given: Not Answered Tobacco comments: smoking cessation materials not required    Clinical Intake:  Pre-visit preparation completed: Yes  Pain : 0-10 Pain Score: 4  Pain Type: Chronic pain Pain Location: Back Pain Orientation: Lower Pain Descriptors / Indicators: Aching, Constant     BMI - recorded: 31.8 Nutritional Status: BMI > 30  Obese Nutritional Risks: None Diabetes: No  How often do you need to have someone help you when you read instructions, pamphlets, or other written materials from your doctor or pharmacy?: 1 - Never  Interpreter Needed?: No  Information entered by :: Kennedy Bucker, LPN   Activities of Daily Living     12/01/2023   11:24 AM 11/29/2023    6:20 AM  In your present state of health, do you have any difficulty performing the following activities:  Hearing? 0 0  Vision? 0 0  Difficulty concentrating or making decisions? 0 0  Walking or climbing stairs? 1 0  Dressing or bathing? 0 0  Doing errands, shopping? 0 0  Preparing Food and eating ? N N  Using the Toilet? N N  In the past six months, have you accidently leaked urine? N N  Do you have problems with Cowan of bowel control? N N  Managing your Medications? N N  Managing your Finances? N Y   Housekeeping or managing your  Housekeeping? N N    Patient Care Team: Reubin Milan, MD as PCP - General (Internal Medicine) Callie Fielding, MD as Consulting Physician (Physical Medicine and Rehabilitation) Pilati, Teena Irani, MD as Referring Physician Jae Dire) Irene Limbo., MD (Ophthalmology)  Indicate any recent Medical Services you may have received from other than [facility] providers in the past year (date may be approximate).     Assessment:   This is a routine wellness examination for Kimberly Cowan.  Hearing/Vision screen Hearing Screening - Comments:: NO AIDS Vision Screening - Comments:: GLASSES FOR COMPUTER, DRIVING- [email or web address]   Goals Addressed             This Visit's Progress    DIET - REDUCE SUGAR INTAKE         Depression Screen     12/01/2023   11:19 AM 07/15/2023    2:12 PM 11/25/2022    3:31 PM 11/06/2022    3:26 PM 10/05/2022    8:07 AM 10/24/2021    1:34 PM 10/02/2021    8:02 AM  PHQ 2/9 Scores  PHQ - 2 Score 0 0 0 1 3 0 0  PHQ- 9 Score 0  0 5 8 3 4     Fall Risk     12/01/2023   11:24 AM 11/29/2023    6:20 AM 10/14/2023    2:51 PM 07/15/2023    2:11 PM 04/15/2023    2:36 PM  Fall Risk   Falls in the past year? 1 1 1 1  0  Number falls in past yr: 0 0 1 1 0  Comment   2 weeks ago    Injury with Fall? 0 1 1 0   Comment   her dog tripped her, bruised her right leg    Risk for fall due to : History of fall(s)    No Fall Risks  Follow up Falls evaluation completed;Falls prevention discussed    Falls evaluation completed    MEDICARE RISK AT HOME:  Medicare Risk at Home Any stairs in or around the home?: Yes If so, are there any without handrails?: No Home free of loose throw rugs in walkways, pet beds, electrical cords, etc?: Yes Adequate lighting in your home to reduce risk of falls?: Yes Life alert?: No Use of a cane, walker or w/c?: Yes (CANE IN UNFAMILIAR SETTINGS) Grab bars in the bathroom?: No Shower chair or bench in shower?: No Elevated  toilet seat or a handicapped toilet?: No  TIMED UP AND GO:  Was the test performed?  No  Cognitive Function: 6CIT completed        12/01/2023   11:26 AM 11/25/2022    3:38 PM 03/13/2019    9:48 AM 03/09/2018    8:49 AM  6CIT Screen  What Year? 0 points 0 points 0 points 0 points  What month? 0 points 0 points 0 points 0 points  What time? 0 points 0 points 0 points 0 points  Count back from 20 0 points 0 points 0 points 0 points  Months in reverse 0 points 0 points 0 points 0 points  Repeat phrase 2 points 0 points 0 points 0 points  Total Score 2 points 0 points 0 points 0 points    Immunizations Immunization History  Administered Date(s) Administered   Influenza,inj,Quad PF,6+ Mos 08/14/2016, 11/15/2017, 08/15/2018, 07/07/2019, 07/24/2020, 07/30/2021   MMR 10/20/2002   PFIZER(Purple Top)SARS-COV-2 Vaccination 12/24/2019, 01/16/2020, 08/04/2020, 08/17/2021, 11/13/2022   Pneumococcal Polysaccharide-23 08/17/2021, 11/13/2022   Td 08/18/1991, 10/20/2002  Tdap 04/24/2014   Zoster Recombinant(Shingrix) 01/09/2019, 03/13/2019    Screening Tests Health Maintenance  Topic Date Due   Hepatitis C Screening  Never done   COVID-19 Vaccine (6 - 2024-25 season) 05/30/2023   INFLUENZA VACCINE  12/27/2023 (Originally 04/29/2023)   MAMMOGRAM  01/22/2024   DTaP/Tdap/Td (4 - Td or Tdap) 04/24/2024   Medicare Annual Wellness (AWV)  11/30/2024   Colonoscopy  04/12/2028   HIV Screening  Completed   Zoster Vaccines- Shingrix  Completed   Pneumococcal Vaccine 80-68 Years old  Aged Out   HPV VACCINES  Aged Out    Health Maintenance  Health Maintenance Due  Topic Date Due   Hepatitis C Screening  Never done   COVID-19 Vaccine (6 - 2024-25 season) 05/30/2023   Health Maintenance Items Addressed: Mammogram ordered COLONOSCOPY UP TO DATE NO FLU OR PNA SHOTS (DECLINED) HAD SHINGRIX  Additional Screening:  Vision Screening: Recommended annual ophthalmology exams for early detection of  glaucoma and other disorders of the eye.  Dental Screening: Recommended annual dental exams for proper oral hygiene  Community Resource Referral / Chronic Care Management: CRR required this visit?  No   CCM required this visit?  No     Plan:     I have personally reviewed and noted the following in the patient's chart:   Medical and social history Use of alcohol, tobacco or illicit drugs  Current medications and supplements including opioid prescriptions. Patient is not currently taking opioid prescriptions. Functional ability and status Nutritional status Physical activity Advanced directives List of other physicians Hospitalizations, surgeries, and ER visits in previous 12 months Vitals Screenings to include cognitive, depression, and falls Referrals and appointments  In addition, I have reviewed and discussed with patient certain preventive protocols, quality metrics, and best practice recommendations. A written personalized care plan for preventive services as well as general preventive health recommendations were provided to patient.     Hal Hope, LPN   6/0/4540   After Visit Summary: (MyChart) Due to this being a telephonic visit, the after visit summary with patients personalized plan was offered to patient via MyChart   Notes:  MAMMOGRAM REFERRAL SENT

## 2023-12-02 ENCOUNTER — Other Ambulatory Visit: Payer: Self-pay | Admitting: Internal Medicine

## 2023-12-02 ENCOUNTER — Telehealth: Payer: Self-pay | Admitting: *Deleted

## 2023-12-02 DIAGNOSIS — F411 Generalized anxiety disorder: Secondary | ICD-10-CM

## 2023-12-02 NOTE — Telephone Encounter (Signed)
 Requested medications are due for refill today.  yes  Requested medications are on the active medications list.  yes  Last refill. 06/07/2023 #90 1 rf  Future visit scheduled.   no  Notes to clinic.  Pt is more than 3 months overdue for an office visit.    Requested Prescriptions  Pending Prescriptions Disp Refills   sertraline (ZOLOFT) 100 MG tablet [Pharmacy Med Name: SERTRALINE 100MG  TABLETS] 90 tablet 1    Sig: TAKE 1 TABLET(100 MG) BY MOUTH DAILY     Psychiatry:  Antidepressants - SSRI - sertraline Failed - 12/02/2023  5:57 PM      Failed - Valid encounter within last 6 months    Recent Outpatient Visits           11 months ago Community acquired pneumonia of left lung, unspecified part of lung   [place] [facility] at [facility] Mordecai Maes, Melton Alar, [place]   1 year ago Generalized anxiety disorder   [facility] at [facility], Nyoka Cowden, [place]   1 year ago Annual physical exam   [facility] at [facility], Nyoka Cowden, MD   1 year ago Influenza A    [facility] Care [facility] Medicine at [facility], Nyoka Cowden, MD   2 years ago Diarrhea of presumed infectious origin   [facility] at [facility], Nyoka Cowden, MD              Passed - AST in normal range and within 360 days    AST  Date Value Ref Range Status  12/07/2022 15 15 - 41 U/L Final   SGOT(AST)  Date Value Ref Range Status  10/31/2014 17 15 - 37 Unit/L Final         Passed - ALT in normal range and within 360 days    ALT  Date Value Ref Range Status  12/07/2022 15 0 - 44 U/L Final   SGPT (ALT)  Date Value Ref Range Status  10/31/2014 16 14 - 63 U/L Final         Passed - Completed PHQ-2 or PHQ-9 in the last 360 days

## 2023-12-02 NOTE — Progress Notes (Signed)
 Complex Care Management Note Care Guide Note  12/02/2023 Name: Kimberly Cowan MRN: [id number] DOB: November 12, 1965  [name] is a 58 y.o. year old female who is a primary care patient of Judithann Graves Nyoka Cowden, MD . The community resource team was consulted for assistance with Food Insecurity  SDOH screenings and interventions completed:  Yes        Care guide performed the following interventions: Patient provided with information about care guide support team and interviewed to confirm resource needs.  Follow Up Plan:  No further follow up planned at this time. The patient has been provided with needed resources.  Encounter Outcome:  Patient Visit Completed [name]  [facility] Health[facility] Health Care Guide  Direct [phone] Fax:[phone] Website: [email or web address]

## 2023-12-03 ENCOUNTER — Other Ambulatory Visit: Payer: Self-pay

## 2023-12-03 ENCOUNTER — Encounter: Payer: Self-pay | Admitting: Internal Medicine

## 2023-12-03 DIAGNOSIS — F411 Generalized anxiety disorder: Secondary | ICD-10-CM

## 2023-12-03 MED ORDER — SERTRALINE HCL 100 MG PO TABS: 100.0000 mg | ORAL_TABLET | Freq: Every day | ORAL | 0 refills | Status: DC

## 2024-01-04 ENCOUNTER — Encounter

## 2024-01-04 ENCOUNTER — Encounter: Payer: Self-pay | Admitting: Student in an Organized Health Care Education/Training Program

## 2024-01-04 ENCOUNTER — Ambulatory Visit
Payer: 59 | Attending: Student in an Organized Health Care Education/Training Program | Admitting: Student in an Organized Health Care Education/Training Program

## 2024-01-04 VITALS — BP 136/87 | HR 75 | Temp 97.4°F | Resp 16 | Ht 67.0 in | Wt 210.0 lb

## 2024-01-04 DIAGNOSIS — G894 Chronic pain syndrome: Secondary | ICD-10-CM

## 2024-01-04 DIAGNOSIS — Z79891 Long term (current) use of opiate analgesic: Secondary | ICD-10-CM

## 2024-01-04 DIAGNOSIS — M961 Postlaminectomy syndrome, not elsewhere classified: Secondary | ICD-10-CM | POA: Diagnosis not present

## 2024-01-04 DIAGNOSIS — Z981 Arthrodesis status: Secondary | ICD-10-CM

## 2024-01-04 DIAGNOSIS — Z9689 Presence of other specified functional implants: Secondary | ICD-10-CM | POA: Diagnosis not present

## 2024-01-04 DIAGNOSIS — M5416 Radiculopathy, lumbar region: Secondary | ICD-10-CM | POA: Diagnosis not present

## 2024-01-04 DIAGNOSIS — G8929 Other chronic pain: Secondary | ICD-10-CM

## 2024-01-04 MED ORDER — HYDROCODONE-ACETAMINOPHEN 10-325 MG PO TABS: 1.0000 | ORAL_TABLET | Freq: Three times a day (TID) | ORAL | 0 refills | Status: AC | PRN

## 2024-01-04 NOTE — Progress Notes (Signed)
 Nursing Pain Medication Assessment:  Safety precautions to be maintained throughout the outpatient stay will include: orient to surroundings, keep bed in low position, maintain call bell within reach at all times, provide assistance with transfer out of bed and ambulation.  Medication Inspection Compliance: Pill count conducted under aseptic conditions, in front of the patient. Neither the pills nor the bottle was removed from the patient's sight at any time. Once count was completed pills were immediately returned to the patient in their original bottle.  Medication: Hydrocodone/APAP Pill/Patch Count:  9 of 90 pills remain Pill/Patch Appearance: Markings consistent with prescribed medication Bottle Appearance: Standard pharmacy container. Clearly labeled. Filled Date: 1 / 37 / 2025 Last Medication intake:  Yesterday

## 2024-01-04 NOTE — Progress Notes (Signed)
 PROVIDER NOTE: Information contained herein reflects review and annotations entered in association with encounter. Interpretation of such information and data should be left to medically-trained personnel. Information provided to patient can be located elsewhere in the medical record under "Patient Instructions". Document created using STT-dictation technology, any transcriptional errors that may result from process are unintentional.    Patient: Kimberly Cowan  Service Category: E/M  Provider: Edward Jolly, MD  DOB: 19-Feb-1966  DOS: 01/04/2024  Referring Provider: Reubin Milan, MD  MRN: [id number]  Specialty: Interventional Pain Management  PCP: Reubin Milan, MD  Type: Established Patient  Setting: Ambulatory outpatient    Location: Office  Delivery: Face-to-face     HPI  [name], a 58 y.o. 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 [name] primary complain today is Back Pain Last encounter: My last encounter with her was on 10/14/23 Pertinent problems: [name] has Failed back surgical syndrome; Spinal stenosis of lumbar region; Chronic depression; Spinal cord stimulator status [employer], implanted 2017); History of lumbar fusion (L5-S1 PSIF); Encounter for long-term opiate analgesic use; Chronic pain syndrome; and Pain management contract signed on their pertinent problem list. Pain Assessment: Severity of Chronic pain is reported as a 7 /10. Location: Back Lower/DOWN FRONT AND BACK OF LEGS BILAT TO FEET. Onset: More than a month ago. Quality: Aching, Burning, Sharp, Shooting. Timing: Constant. Modifying factor(s): meds, SCS. Vitals:  height is 5\' 7"  (1.702 m) and weight is 210 lb (95.3 kg). Her temperature is 97.4 F (36.3 C) (abnormal). Her blood pressure is 136/87 and her pulse is 75. Her respiration is 16 and oxygen saturation is 97%.   Reason for encounter: medication management.    No change in medical history since last visit.  Patient's pain is at baseline.  Patient continues multimodal pain regimen as prescribed.  States that it provides pain relief and improvement in functional status.  Continues to utilize her spinal cord stimulator.    12/24/22 Was recently hospitalized for PNA- IV abx and IV fluids. Was on oxygen at the hospital at 4L, weaned down at discharge. Patient's pain is at baseline.  Patient continues multimodal pain regimen as prescribed.  States that it provides pain relief and improvement in functional status.  05/07/22: Second patient visit today.  She has completed her urine toxicology screen which is appropriate.  I will have her sign pain contract and take her on for chronic pain management.  I will send in a prescription for hydrocodone 7.5 mg nightly.  I will see her back in 4 weeks for medication management.   Please see initial clinic visit HPI below Kimberly Cowan is a pleasant 58 year old female with a history of L4-S1 spinal fusion with [employer] spinal cord stimulator in place who is hoping to establish with pain management as her previous pain provider left [facility] and is no longer excepting new patients.  She states that she has done physical therapy in the past tried various medications as well as injections with limited response.  She had her spinal cord stimulator implanted in 2017 with [facility] which was beneficial for her and allowed her to wean her hydrocodone to just once at night.  She states that she is able to manage with 7.5 mg nightly.  She is hoping to have this continued.  She also has restless leg syndrome and obesity.  She also has obstructive sleep apnea.  Given that she is on low-dose  and has been compliant with previous pain clinic protocol, I will have her obtain baseline urine toxicology screen which is customary for new patients and I will see her back in approximately 7 to 10 days to sign pain contract  and take her on for medication   Pharmacotherapy Assessment  Analgesic:  Hydrocodone 7.5 mg nightly as needed #90/3 months  Monitoring: [place] PMP: PDMP reviewed during this encounter.       Pharmacotherapy: No side-effects or adverse reactions reported. Compliance: No problems identified. Effectiveness: Clinically acceptable.  Nonah Mattes, RN  01/04/2024  2:59 PM  Sign when Signing Visit Nursing Pain Medication Assessment:  Safety precautions to be maintained throughout the outpatient stay will include: orient to surroundings, keep bed in low position, maintain call bell within reach at all times, provide assistance with transfer out of bed and ambulation.  Medication Inspection Compliance: Pill count conducted under aseptic conditions, in front of the patient. Neither the pills nor the bottle was removed from the patient's sight at any time. Once count was completed pills were immediately returned to the patient in their original bottle.  Medication: Hydrocodone/APAP Pill/Patch Count:  9 of 90 pills remain Pill/Patch Appearance: Markings consistent with prescribed medication Bottle Appearance: Standard pharmacy container. Clearly labeled. Filled Date: 1 / 34 / 2025 Last Medication intake:  Yesterday    No results found for: "CBDTHCR" No results found for: "D8THCCBX" No results found for: "D9THCCBX"  UDS:  Summary  Date Value Ref Range Status  07/15/2023 Note  Final    Comment:    ==================================================================== [employer] 13 (MW) ==================================================================== Test                             Result       Flag       Units  Drug Present and Declared for Prescription Verification   Oxazepam                       762          EXPECTED   ng/mg creat   Temazepam                      >2597        EXPECTED   ng/mg creat    Oxazepam and temazepam are expected metabolites of diazepam.    Oxazepam is also an  expected metabolite of other benzodiazepine    drugs, including chlordiazepoxide, prazepam, clorazepate, halazepam,    and temazepam.  Oxazepam and temazepam are available as scheduled    prescription medications.    Hydrocodone                    199          EXPECTED   ng/mg creat   Hydromorphone                  71           EXPECTED   ng/mg creat   Dihydrocodeine                 70           EXPECTED   ng/mg creat   Norhydrocodone                 303          EXPECTED   ng/mg creat  Sources of hydrocodone include scheduled prescription medications.    Hydromorphone, dihydrocodeine and norhydrocodone are expected    metabolites of hydrocodone. Hydromorphone and dihydrocodeine are    also available as scheduled prescription medications.  ==================================================================== Test                      Result    Flag   Units      Ref Range   Creatinine              77               mg/dL      >=91 ==================================================================== Declared Medications:  The flagging and interpretation on this report are based on the  following declared medications.  Unexpected results may arise from  inaccuracies in the declared medications.   **Note: The testing scope of this panel includes these medications:   Hydrocodone (Norco)  Temazepam (Restoril)   **Note: The testing scope of this panel does not include the  following reported medications:   Acetaminophen (Norco)  Atropine (Lomotil)  Colestipol (Colestid)  Cyclobenzaprine (Flexeril)  Diphenoxylate (Lomotil)  Fluticasone (Flonase)  Furosemide (Lasix)  Gabapentin  Latanoprost (Xalatan)  Metronidazole (Flagyl)  Multivitamin  Pantoprazole (Protonix)  Potassium (Klor-Con)  Rizatriptan (Maxalt)  Sertraline (Zoloft)  Vitamin B12 ==================================================================== For clinical consultation, please call [phone]. ====================================================================       ROS  Constitutional: Denies any fever or chills Gastrointestinal: No reported hemesis, hematochezia, vomiting, or acute GI distress Musculoskeletal:  + LBP Neurological: No reported episodes of acute onset apraxia, aphasia, dysarthria, agnosia, amnesia, paralysis, loss of coordination, or loss of consciousness  Medication Review  HYDROcodone-acetaminophen, Lotilaner, Multiple Vitamins-Minerals, colestipol, cyanocobalamin, cyclobenzaprine, diphenoxylate-atropine, fluticasone, furosemide, gabapentin, latanoprost, pantoprazole, potassium chloride, rizatriptan, sertraline, and temazepam  History Review  Allergy: [name] is allergic to morphine and codeine, penicillins, latex, and baclofen. Drug: [name]  reports no history of drug use. Alcohol:  reports no history of alcohol use. Tobacco:  reports that she has never smoked. She has never used smokeless tobacco. Social: [name]  reports that she has never smoked. She has never used smokeless tobacco. She reports that she does not drink alcohol and does not use drugs. Medical:  has a past medical history of Allergy (1999), Anemia, Anxiety, Arm pain, left (05/17/2018), Arthritis, Bilateral lumbar radiculopathy, Cellulitis of abdominal wall (03/03/2021), Chronic back pain, Chronic GERD (03/03/2021), Clinical depression (04/04/2012), Depression, DVT (deep venous thrombosis) (HCC), Encounter for screening colonoscopy, Essential (primary) hypertension (06/04/2020), Glaucoma, Headache(784.0), Major depressive disorder with single episode, in full remission (HCC) (08/15/2018), Malfunction of jejunostomy tube (HCC) (02/13/2021), Ovarian cyst, Pneumonia, Pulmonary embolism (HCC) (05/2017), S/P laparoscopic cholecystectomy (02/07/2021), and Sleep apnea. Surgical: [name]  has a past surgical history that includes Tonsillectomy; Gastric bypass  (2005); Reduction mammaplasty (Bilateral); Spinal cord stimulator insertion (N/A, 03/20/2016); Abdominal hysterectomy (10/2006); Laparoscopic salpingo oophorectomy (Bilateral, 10/05/2016); Back surgery (2014); Back surgery (2009); Colonoscopy with propofol (N/A, 04/12/2018); Cesarean section (1989 1995); Spine surgery (2009 2014 2017); Knee arthroscopy with lateral menisectomy (Left, 05/27/2017); Knee arthroscopy with lateral release (Left, 05/27/2017); Bariatric Surgery (07/2020); and Cholecystectomy (01/2021). Family: family history includes Arthritis in her father, mother, sister, and sister; Cancer in her father and maternal aunt; Diabetes in her maternal aunt, maternal grandmother, mother, and sister; Hypertension in her father, mother, and sister; Obesity in her daughter, father, mother, sister, and sister; Varicose Veins in her mother.  Laboratory Chemistry Profile   Renal Lab Results  Component  Value Date   BUN 8 12/10/2022   CREATININE 0.57 12/10/2022   BCR 17 10/08/2022   GFRAA 100 07/24/2020   GFRNONAA >60 12/10/2022    Hepatic Lab Results  Component Value Date   AST 15 12/07/2022   ALT 15 12/07/2022   ALBUMIN 2.9 (L) 12/07/2022   ALKPHOS 61 12/07/2022   LIPASE 25 12/07/2022    Electrolytes Lab Results  Component Value Date   NA 139 12/10/2022   K 3.6 12/10/2022   CL 110 12/10/2022   CALCIUM 9.0 12/10/2022   MG 2.0 12/07/2022   PHOS 2.8 12/08/2022    Bone Lab Results  Component Value Date   VD25OH 9 02/19/2022    Inflammation (CRP: Acute Phase) (ESR: Chronic Phase) Lab Results  Component Value Date   LATICACIDVEN 1.6 12/08/2022         Note: Above Lab results reviewed.  Recent Imaging Review  MM 3D SCREEN BREAST BILATERAL CLINICAL DATA:  Screening.  EXAM: DIGITAL SCREENING BILATERAL MAMMOGRAM WITH TOMOSYNTHESIS AND CAD  TECHNIQUE: Bilateral screening digital craniocaudal and mediolateral oblique mammograms were obtained. Bilateral screening digital  breast tomosynthesis was performed. The images were evaluated with computer-aided detection.  COMPARISON:  Previous exam(s).  ACR Breast Density Category b: There are scattered areas of fibroglandular density.  FINDINGS: There are no findings suspicious for malignancy.  IMPRESSION: No mammographic evidence of malignancy. A result letter of this screening mammogram will be mailed directly to the patient.  RECOMMENDATION: Screening mammogram in one year. (Code:SM-B-01Y)  BI-RADS CATEGORY  1: Negative.  Electronically Signed   By: Jacob Moores M.D.   On: 01/26/2023 09:17 Note: Reviewed        Physical Exam  General appearance: Well nourished, well developed, and well hydrated. In no apparent acute distress Mental status: Alert, oriented x 3 (person, place, & time)       Respiratory: No evidence of acute respiratory distress Eyes: PERLA Vitals: BP 136/87   Pulse 75   Temp (!) 97.4 F (36.3 C)   Resp 16   Ht 5\' 7"  (1.702 m)   Wt 210 lb (95.3 kg)   SpO2 97%   BMI 32.89 kg/m  BMI: Estimated body mass index is 32.89 kg/m as calculated from the following:   Height as of this encounter: 5\' 7"  (1.702 m).   Weight as of this encounter: 210 lb (95.3 kg). Ideal: Ideal body weight: 61.6 kg (135 lb 12.9 oz) Adjusted ideal body weight: 75.1 kg (165 lb 7.7 oz)    Thoracic Spine Area Exam  Skin & Axial Inspection: Well healed scar from previous spine surgery detected Alignment: Symmetrical Functional ROM: Pain restricted ROM Stability: No instability detected Muscle Tone/Strength: Functionally intact. No obvious neuro-muscular anomalies detected. Sensory (Neurological): Neurogenic pain pattern Muscle strength & Tone: No palpable anomalies Lumbar Spine Area Exam  Skin & Axial Inspection: Well healed scar from previous spine surgery detected spinal cord stimulator IPG  along the left flank Alignment: Symmetrical Functional ROM: Pain restricted ROM       Stability: No  instability detected Muscle Tone/Strength: Functionally intact. No obvious neuro-muscular anomalies detected. Sensory (Neurological): Dermatomal pain pattern   Gait & Posture Assessment  Ambulation: Limited Gait: Antalgic gait (limping) Posture: Difficulty standing up straight, due to pain  Lower Extremity Exam      Side: Right lower extremity   Side: Left lower extremity  Stability: No instability observed           Stability: No instability observed  Skin & Extremity Inspection: Skin color, temperature, and hair growth are WNL. No peripheral edema or cyanosis. No masses, redness, swelling, asymmetry, or associated skin lesions. No contractures.   Skin & Extremity Inspection: Skin color, temperature, and hair growth are WNL. No peripheral edema or cyanosis. No masses, redness, swelling, asymmetry, or associated skin lesions. No contractures.  Functional ROM: Unrestricted ROM                   Functional ROM: Unrestricted ROM                  Muscle Tone/Strength: Functionally intact. No obvious neuro-muscular anomalies detected.   Muscle Tone/Strength: Functionally intact. No obvious neuro-muscular anomalies detected.  Sensory (Neurological): Musculoskeletal pain pattern         Sensory (Neurological): Musculoskeletal pain pattern        DTR: Patellar: deferred today Achilles: deferred today Plantar: deferred today   DTR: Patellar: deferred today Achilles: deferred today Plantar: deferred today  Palpation: No palpable anomalies   Palpation: No palpable anomalies       Assessment   Diagnosis Status  1. Failed back surgical syndrome   2. Spinal cord stimulator status   3. Chronic radicular lumbar pain   4. History of lumbar fusion   5. Encounter for long-term opiate analgesic use   6. Chronic pain syndrome     Controlled Controlled Controlled     Plan of Care    Ms. Afton Evette Cowan has a current medication list which includes the following long-term  medication(s): fluticasone, furosemide, gabapentin, rizatriptan, sertraline, temazepam, and colestipol.  Pharmacotherapy (Medications Ordered): Meds ordered this encounter  Medications   HYDROcodone-acetaminophen (NORCO) 10-325 MG tablet    Sig: Take 1 tablet by mouth every 8 (eight) hours as needed.    Dispense:  90 tablet    Refill:  0  Continue with spinal cord stimulation UDS UTD   No orders of the defined types were placed in this encounter.    Follow-up plan:   Return in about 3 months (around 04/06/2024) for MM, F2F.    I discussed the assessment and treatment plan with the patient. The patient was provided an opportunity to ask questions and all were answered. The patient agreed with the plan and demonstrated an understanding of the instructions.  Patient advised to call back or seek an in-person evaluation if the symptoms or condition worsens.  Duration of encounter: .  Total time on encounter, as per AMA guidelines included both the face-to-face and non-face-to-face time personally spent by the physician and/or other qualified health care professional(s) on the day of the encounter (includes time in activities that require the physician or other qualified health care professional and does not include time in activities normally performed by clinical staff). Physician's time may include the following activities when performed: preparing to see the patient (eg, review of tests, pre-charting review of records) obtaining and/or reviewing separately obtained history performing a medically appropriate examination and/or evaluation counseling and educating the patient/family/caregiver ordering medications, tests, or procedures referring and communicating with other health care professionals (when not separately reported) documenting clinical information in the electronic or other health record independently interpreting results (not separately reported) and communicating  results to the patient/ family/caregiver care coordination (not separately reported)  Note by: Edward Jolly, MD Date: 01/04/2024; Time: 3:14 PM

## 2024-01-22 DIAGNOSIS — M25562 Pain in left knee: Secondary | ICD-10-CM | POA: Diagnosis not present

## 2024-01-31 ENCOUNTER — Other Ambulatory Visit: Payer: Self-pay | Admitting: Internal Medicine

## 2024-01-31 DIAGNOSIS — F411 Generalized anxiety disorder: Secondary | ICD-10-CM

## 2024-02-01 NOTE — Telephone Encounter (Signed)
 Requested medications are due for refill today.  yes  Requested medications are on the active medications list.  yes  Last refill. 12/03/2023 #30 0 rf  Future visit scheduled.   no  Notes to clinic.  Labs are expired. Pt already given a courtesy refill.    Requested Prescriptions  Pending Prescriptions Disp Refills   sertraline  (ZOLOFT ) 100 MG tablet [Pharmacy Med Name: SERTRALINE  100MG  TABLETS] 30 tablet 0    Sig: TAKE 1 TABLET(100 MG) BY MOUTH DAILY     Psychiatry:  Antidepressants - SSRI - sertraline  Failed - 02/01/2024  4:25 PM      Failed - AST in normal range and within 360 days    AST  Date Value Ref Range Status  12/07/2022 15 15 - 41 U/L Final   SGOT(AST)  Date Value Ref Range Status  10/31/2014 17 15 - 37 Unit/L Final         Failed - ALT in normal range and within 360 days    ALT  Date Value Ref Range Status  12/07/2022 15 0 - 44 U/L Final   SGPT (ALT)  Date Value Ref Range Status  10/31/2014 16 14 - 63 U/L Final         Passed - Completed PHQ-2 or PHQ-9 in the last 360 days      Passed - Valid encounter within last 6 months    Recent Outpatient Visits   None

## 2024-02-07 ENCOUNTER — Encounter: Payer: Self-pay | Admitting: Student in an Organized Health Care Education/Training Program

## 2024-02-08 ENCOUNTER — Other Ambulatory Visit: Payer: Self-pay

## 2024-02-08 ENCOUNTER — Other Ambulatory Visit: Payer: Self-pay | Admitting: Internal Medicine

## 2024-02-08 DIAGNOSIS — M961 Postlaminectomy syndrome, not elsewhere classified: Secondary | ICD-10-CM

## 2024-02-08 DIAGNOSIS — G894 Chronic pain syndrome: Secondary | ICD-10-CM

## 2024-02-08 DIAGNOSIS — G8929 Other chronic pain: Secondary | ICD-10-CM

## 2024-02-08 DIAGNOSIS — F5101 Primary insomnia: Secondary | ICD-10-CM

## 2024-02-08 MED ORDER — CYCLOBENZAPRINE HCL 10 MG PO TABS: 10.0000 mg | ORAL_TABLET | Freq: Every day | ORAL | 5 refills | Status: DC | PRN

## 2024-02-09 ENCOUNTER — Other Ambulatory Visit: Payer: Self-pay | Admitting: Internal Medicine

## 2024-02-09 ENCOUNTER — Encounter: Payer: Self-pay | Admitting: Internal Medicine

## 2024-02-09 NOTE — Progress Notes (Unsigned)
 Date:  02/09/2024   Name:  Kimberly Cowan   DOB:  1966/04/12   MRN:  [id number]   Chief Complaint: No chief complaint on file.  HPI  Review of Systems   Lab Results  Component Value Date   NA 139 12/10/2022   K 3.6 12/10/2022   CO2 25 12/10/2022   GLUCOSE 87 12/10/2022   BUN 8 12/10/2022   CREATININE 0.57 12/10/2022   CALCIUM 9.0 12/10/2022   EGFR 101 10/08/2022   GFRNONAA >60 12/10/2022   Lab Results  Component Value Date   CHOL 135 02/19/2022   HDL 65 02/19/2022   LDLCALC 55 02/19/2022   TRIG 74 02/19/2022   CHOLHDL 1.8 10/02/2021   Lab Results  Component Value Date   TSH 2.150 10/08/2022   Lab Results  Component Value Date   HGBA1C 5.8 04/24/2023   Lab Results  Component Value Date   WBC 5.0 12/10/2022   HGB 10.3 (L) 12/10/2022   HCT 32.8 (L) 12/10/2022   MCV 93.7 12/10/2022   PLT 157 12/10/2022   Lab Results  Component Value Date   ALT 15 12/07/2022   AST 15 12/07/2022   ALKPHOS 61 12/07/2022   BILITOT 1.4 (H) 12/07/2022   Lab Results  Component Value Date   VD25OH 9 02/19/2022     Patient Active Problem List   Diagnosis Date Noted   Obesity (BMI 30-39.9) 12/07/2022   HTN (hypertension) 12/07/2022   Generalized anxiety disorder 10/05/2022   Pain management contract signed 05/07/2022   Spinal cord stimulator status [employer], implanted 2017) 04/28/2022   History of lumbar fusion (L5-S1 PSIF) 04/28/2022   Encounter for long-term opiate analgesic use 04/28/2022   Chronic pain syndrome 04/28/2022   High serum parathyroid hormone (PTH) 02/25/2022   Vitamin A  deficiency 02/25/2022   Vitamin D  deficiency 02/25/2022   Exocrine pancreatic insufficiency 02/19/2022   Degenerative spondylolisthesis 12/19/2021   Chronic GERD 03/03/2021   Status post hysterectomy 11/05/2020   Bariatric surgery status 01/01/2020   Primary insomnia 12/28/2019   Failed back surgical syndrome 11/27/2019   Mild peripheral edema 11/15/2017    Perforated ear drum, left 06/25/2017   History of pulmonary embolism 06/10/2017   Environmental and seasonal allergies 04/07/2017   Tinea versicolor 02/03/2017   Glaucoma 09/28/2016   B12 nutritional deficiency 08/05/2016   OSA on CPAP 03/04/2016   Chronic radicular lumbar pain 08/14/2015   Prediabetes 07/01/2015   Spinal stenosis of lumbar region 12/10/2014   Headache, migraine 04/04/2012    Allergies  Allergen Reactions   Morphine  And Codeine  Hives, Shortness Of Breath, Nausea And Vomiting and Other (See Comments)    "can't breathe"   Penicillins Anaphylaxis, Hives, Nausea And Vomiting, Shortness Of Breath and Rash    "can't breathe", Has patient had a PCN reaction causing immediate rash, facial/tongue/throat swelling, SOB or lightheadedness with hypotension: Yes  Has patient had a PCN reaction causing severe rash involving mucus membranes or skin necrosis: No  Has patient had a PCN reaction that required hospitalization No  Has patient had a PCN reaction occurring within the last 10 years: No  If all of the above answers are "NO", then may proceed with Cephalosporin use.  Other Reaction(s): wheezing   Latex Hives   Baclofen  Nausea Only    Jittery, anxious    Past Surgical History:  Procedure Laterality Date   ABDOMINAL HYSTERECTOMY  10/2006   BACK SURGERY  2014   lumbar fusion   BACK SURGERY  2009   discectomy   BARIATRIC SURGERY  07/2020   Revision   CESAREAN SECTION  1989 1995   CHOLECYSTECTOMY  01/2021   COLONOSCOPY WITH PROPOFOL  N/A 04/12/2018   Procedure: COLONOSCOPY WITH PROPOFOL ;  Surgeon: Selena Daily, MD;  Location: [facility] ENDOSCOPY;  Service: Gastroenterology;  Laterality: N/A;   GASTRIC BYPASS  2005   KNEE ARTHROSCOPY WITH LATERAL MENISECTOMY Left 05/27/2017   Procedure: KNEE ARTHROSCOPY WITH LATERAL MENISECTOMY;  Surgeon: Molli Angelucci, MD;  Location: [facility] ORS;  Service: Orthopedics;  Laterality: Left;   KNEE ARTHROSCOPY WITH LATERAL RELEASE Left  05/27/2017   Procedure: KNEE ARTHROSCOPY WITH LATERAL RELEASE;  Surgeon: Molli Angelucci, MD;  Location: [facility] ORS;  Service: Orthopedics;  Laterality: Left;   LAPAROSCOPIC SALPINGO OOPHERECTOMY Bilateral 10/05/2016   Procedure: LAPAROSCOPIC SALPINGO OOPHORECTOMY;  Surgeon: Colan Dash, MD;  Location: [facility] ORS;  Service: Gynecology;  Laterality: Bilateral;   REDUCTION MAMMAPLASTY Bilateral    2001   SPINAL CORD STIMULATOR INSERTION N/A 03/20/2016   Procedure: LUMBAR SPINAL CORD STIMULATOR INSERTION;  Surgeon: Isadora Mar, MD;  Location: [facility]UR[facility];  Service: Neurosurgery;  Laterality: N/A;   SPINE SURGERY  2009 2014 2017   TONSILLECTOMY      Social History   Tobacco Use   Smoking status: Never   Smokeless tobacco: Never   Tobacco comments:    smoking cessation materials not required  Vaping Use   Vaping status: Never Used  Substance Use Topics   Alcohol use: No   Drug use: No     Medication list has been reviewed and updated.  No outpatient medications have been marked as taking for the 02/09/24 encounter (Orders Only) with Sheron Dixons, MD.       11/06/2022    3:26 PM 10/05/2022    8:07 AM 10/24/2021    1:35 PM 10/02/2021    8:02 AM  GAD 7 : Generalized Anxiety Score  Nervous, Anxious, on Edge 2 2 1 1   Control/stop worrying 1 2 0 1  Worry too much - different things 2 2 1 1   Trouble relaxing 3 2 3 1   Restless 2 2 0 1  Easily annoyed or irritable 1 2 1 1   Afraid - awful might happen 2 3 1 1   Total GAD 7 Score 13 15 7 7   Anxiety Difficulty Somewhat difficult Very difficult  Not difficult at all       01/04/2024    3:00 PM 12/01/2023   11:19 AM 07/15/2023    2:12 PM  Depression screen PHQ 2/9  Decreased Interest 0 0 0  Down, Depressed, Hopeless 0 0 0  PHQ - 2 Score 0 0 0  Altered sleeping  0   Tired, decreased energy  0   Change in appetite  0   Feeling bad or failure about yourself   0   Trouble concentrating  0   Moving slowly or fidgety/restless  0    Suicidal thoughts  0   PHQ-9 Score  0   Difficult doing work/chores  Not difficult at all     BP Readings from Last 3 Encounters:  01/04/24 136/87  10/14/23 (!) 142/95  07/15/23 125/86    Physical Exam  Wt Readings from Last 3 Encounters:  01/04/24 210 lb (95.3 kg)  10/14/23 203 lb (92.1 kg)  07/15/23 203 lb (92.1 kg)    There were no vitals taken for this visit.  Assessment and Plan:  Problem List Items Addressed This Visit  None   No follow-ups on file.    Sheron Dixons, MD [facility] [place]

## 2024-02-10 NOTE — Telephone Encounter (Signed)
 Requested medications are due for refill today.  yes  Requested medications are on the active medications list.  yes  Last refill. 11/05/2023 #30 2 rf  Future visit scheduled.   yes  Notes to clinic.  Refill not delegated.    Requested Prescriptions  Pending Prescriptions Disp Refills   temazepam  (RESTORIL ) 15 MG capsule [Pharmacy Med Name: TEMAZEPAM  15MG  CAPSULES] 30 capsule     Sig: TAKE 1 CAPSULE(15 MG) BY MOUTH AT BEDTIME AS NEEDED FOR SLEEP     Not Delegated - Psychiatry: Anxiolytics/Hypnotics 2 Failed - 02/10/2024  9:39 AM      Failed - This refill cannot be delegated      Failed - Urine Drug Screen completed in last 360 days      Passed - Patient is not pregnant      Passed - Valid encounter within last 6 months    Recent Outpatient Visits   None

## 2024-02-10 NOTE — Telephone Encounter (Signed)
 Please review.  KP

## 2024-02-11 ENCOUNTER — Other Ambulatory Visit: Payer: Self-pay | Admitting: Internal Medicine

## 2024-02-11 ENCOUNTER — Ambulatory Visit (INDEPENDENT_AMBULATORY_CARE_PROVIDER_SITE_OTHER): Admitting: Internal Medicine

## 2024-02-11 ENCOUNTER — Encounter: Payer: Self-pay | Admitting: Internal Medicine

## 2024-02-11 VITALS — BP 116/72 | HR 90 | Ht 67.0 in | Wt 218.1 lb

## 2024-02-11 DIAGNOSIS — F411 Generalized anxiety disorder: Secondary | ICD-10-CM

## 2024-02-11 DIAGNOSIS — I10 Essential (primary) hypertension: Secondary | ICD-10-CM

## 2024-02-11 DIAGNOSIS — R600 Localized edema: Secondary | ICD-10-CM | POA: Diagnosis not present

## 2024-02-11 DIAGNOSIS — G43909 Migraine, unspecified, not intractable, without status migrainosus: Secondary | ICD-10-CM

## 2024-02-11 DIAGNOSIS — F5101 Primary insomnia: Secondary | ICD-10-CM

## 2024-02-11 DIAGNOSIS — E538 Deficiency of other specified B group vitamins: Secondary | ICD-10-CM | POA: Diagnosis not present

## 2024-02-11 MED ORDER — RIZATRIPTAN BENZOATE 10 MG PO TBDP: ORAL_TABLET | ORAL | 3 refills | Status: DC

## 2024-02-11 MED ORDER — FUROSEMIDE 20 MG PO TABS
20.0000 mg | ORAL_TABLET | Freq: Every day | ORAL | 0 refills | Status: DC | PRN
Start: 2024-02-11 — End: 2024-02-15

## 2024-02-11 MED ORDER — FLUTICASONE PROPIONATE 50 MCG/ACT NA SUSP: 1.0000 | Freq: Every day | NASAL | 1 refills | Status: DC | PRN

## 2024-02-11 MED ORDER — POTASSIUM CHLORIDE ER 10 MEQ PO TBCR: 10.0000 meq | EXTENDED_RELEASE_TABLET | Freq: Two times a day (BID) | ORAL | 1 refills | Status: DC

## 2024-02-11 NOTE — Assessment & Plan Note (Signed)
 No recent change in migraine headaches. Headaches respond well to current therapy with Rizatriptan . Will continue regimen;  follow up if worsening.

## 2024-02-11 NOTE — Assessment & Plan Note (Signed)
 Blood pressure is well controlled.  Current medications are prn Lasix . Will continue same regimen along with efforts to limit dietary sodium.

## 2024-02-11 NOTE — Patient Instructions (Signed)
 Call [facility] to schedule your mammogram at [phone].

## 2024-02-11 NOTE — Assessment & Plan Note (Signed)
 Doing fairly well on Temazepam  nightly. No evidence if misuse or abuse.

## 2024-02-11 NOTE — Assessment & Plan Note (Signed)
 Clinically stable on Sertraline .   No SI or HI on evaluation. Sleep is adequate on Temazepam . Plan to continue same medications for now.

## 2024-02-11 NOTE — Assessment & Plan Note (Signed)
 Continues on monthly B12 injections Will check levels and CBC

## 2024-02-11 NOTE — Progress Notes (Signed)
 Date:  02/11/2024   Name:  Kimberly Cowan   DOB:  April 13, 1966   MRN:  [id number]   Chief Complaint: Anxiety and Depression  Anxiety Presents for follow-up visit. Symptoms include insomnia. Patient reports no chest pain, dizziness, nervous/anxious behavior, palpitations or shortness of breath. Symptoms occur occasionally.    Depression        This is a chronic problem.  Associated symptoms include insomnia.  Associated symptoms include no fatigue.  Past treatments include SSRIs - Selective serotonin reuptake inhibitors.  Compliance with treatment is good.  Previous treatment provided significant relief.  Past medical history includes anxiety.   Hypertension This is a chronic problem. The problem is controlled. Associated symptoms include anxiety. Pertinent negatives include no chest pain, palpitations or shortness of breath. Past treatments include diuretics and lifestyle changes.  Migraine  This is a recurrent problem. The problem has been unchanged. The pain quality is similar to prior headaches. Associated symptoms include insomnia. Pertinent negatives include no dizziness or fever. Her past medical history is significant for hypertension.  Insomnia Primary symptoms: sleep disturbance.   The problem occurs nightly. Past treatments include medication. The treatment provided significant relief. PMH includes: depression.     Review of Systems  Constitutional:  Negative for chills, fatigue, fever and unexpected weight change.  Respiratory:  Negative for chest tightness and shortness of breath.   Cardiovascular:  Positive for leg swelling (mild and intermittent). Negative for chest pain and palpitations.  Genitourinary:  Negative for dysuria.  Neurological:  Negative for dizziness and light-headedness.  Psychiatric/Behavioral:  Positive for depression and sleep disturbance. Negative for dysphoric mood. The patient has insomnia. The patient is not nervous/anxious.      Lab  Results  Component Value Date   NA 139 12/10/2022   K 3.6 12/10/2022   CO2 25 12/10/2022   GLUCOSE 87 12/10/2022   BUN 8 12/10/2022   CREATININE 0.57 12/10/2022   CALCIUM 9.0 12/10/2022   EGFR 101 10/08/2022   GFRNONAA >60 12/10/2022   Lab Results  Component Value Date   CHOL 135 02/19/2022   HDL 65 02/19/2022   LDLCALC 55 02/19/2022   TRIG 74 02/19/2022   CHOLHDL 1.8 10/02/2021   Lab Results  Component Value Date   TSH 2.150 10/08/2022   Lab Results  Component Value Date   HGBA1C 5.8 04/24/2023   Lab Results  Component Value Date   WBC 5.0 12/10/2022   HGB 10.3 (L) 12/10/2022   HCT 32.8 (L) 12/10/2022   MCV 93.7 12/10/2022   PLT 157 12/10/2022   Lab Results  Component Value Date   ALT 15 12/07/2022   AST 15 12/07/2022   ALKPHOS 61 12/07/2022   BILITOT 1.4 (H) 12/07/2022   Lab Results  Component Value Date   VD25OH 9 02/19/2022     Patient Active Problem List   Diagnosis Date Noted   Obesity (BMI 30-39.9) 12/07/2022   Primary hypertension 12/07/2022   Generalized anxiety disorder 10/05/2022   Pain management contract signed 05/07/2022   Spinal cord stimulator status [employer], implanted 2017) 04/28/2022   History of lumbar fusion (L5-S1 PSIF) 04/28/2022   Encounter for long-term opiate analgesic use 04/28/2022   Chronic pain syndrome 04/28/2022   High serum parathyroid hormone (PTH) 02/25/2022   Vitamin A  deficiency 02/25/2022   Vitamin D  deficiency 02/25/2022   Exocrine pancreatic insufficiency 02/19/2022   Degenerative spondylolisthesis 12/19/2021   Chronic GERD 03/03/2021   Status post hysterectomy 11/05/2020   Bariatric  surgery status 01/01/2020   Primary insomnia 12/28/2019   Failed back surgical syndrome 11/27/2019   Mild peripheral edema 11/15/2017   Perforated ear drum, left 06/25/2017   History of pulmonary embolism 06/10/2017   Environmental and seasonal allergies 04/07/2017   Tinea versicolor 02/03/2017   Glaucoma 09/28/2016    B12 nutritional deficiency 08/05/2016   OSA on CPAP 03/04/2016   Chronic radicular lumbar pain 08/14/2015   Prediabetes 07/01/2015   Spinal stenosis of lumbar region 12/10/2014   Headache, migraine 04/04/2012    Allergies  Allergen Reactions   Morphine  And Codeine  Hives, Shortness Of Breath, Nausea And Vomiting and Other (See Comments)    "can't breathe"   Penicillins Anaphylaxis, Hives, Nausea And Vomiting, Shortness Of Breath and Rash    "can't breathe", Has patient had a PCN reaction causing immediate rash, facial/tongue/throat swelling, SOB or lightheadedness with hypotension: Yes  Has patient had a PCN reaction causing severe rash involving mucus membranes or skin necrosis: No  Has patient had a PCN reaction that required hospitalization No  Has patient had a PCN reaction occurring within the last 10 years: No  If all of the above answers are "NO", then may proceed with Cephalosporin use.  Other Reaction(s): wheezing   Latex Hives   Baclofen  Nausea Only    Jittery, anxious    Past Surgical History:  Procedure Laterality Date   ABDOMINAL HYSTERECTOMY  10/2006   BACK SURGERY  2014   lumbar fusion   BACK SURGERY  2009   discectomy   BARIATRIC SURGERY  07/2020   Revision   CESAREAN SECTION  1989 1995   CHOLECYSTECTOMY  01/2021   COLONOSCOPY WITH PROPOFOL  N/A 04/12/2018   Procedure: COLONOSCOPY WITH PROPOFOL ;  Surgeon: Selena Daily, MD;  Location: [facility] ENDOSCOPY;  Service: Gastroenterology;  Laterality: N/A;   GASTRIC BYPASS  2005   KNEE ARTHROSCOPY WITH LATERAL MENISECTOMY Left 05/27/2017   Procedure: KNEE ARTHROSCOPY WITH LATERAL MENISECTOMY;  Surgeon: Molli Angelucci, MD;  Location: [facility] ORS;  Service: Orthopedics;  Laterality: Left;   KNEE ARTHROSCOPY WITH LATERAL RELEASE Left 05/27/2017   Procedure: KNEE ARTHROSCOPY WITH LATERAL RELEASE;  Surgeon: Molli Angelucci, MD;  Location: [facility] ORS;  Service: Orthopedics;  Laterality: Left;   LAPAROSCOPIC SALPINGO  OOPHERECTOMY Bilateral 10/05/2016   Procedure: LAPAROSCOPIC SALPINGO OOPHORECTOMY;  Surgeon: Colan Dash, MD;  Location: [facility] ORS;  Service: Gynecology;  Laterality: Bilateral;   REDUCTION MAMMAPLASTY Bilateral    2001   SPINAL CORD STIMULATOR INSERTION N/A 03/20/2016   Procedure: LUMBAR SPINAL CORD STIMULATOR INSERTION;  Surgeon: Isadora Mar, MD;  Location: [facility];  Service: Neurosurgery;  Laterality: N/A;   SPINE SURGERY  2009 2014 2017   TONSILLECTOMY      Social History   Tobacco Use   Smoking status: Never   Smokeless tobacco: Never   Tobacco comments:    smoking cessation materials not required  Vaping Use   Vaping status: Never Used  Substance Use Topics   Alcohol use: No   Drug use: No     Medication list has been reviewed and updated.  Current Meds  Medication Sig   cyanocobalamin  (VITAMIN B12) 1000 MCG/ML injection Inject 1 mL (1,000 mcg total) into the muscle every 30 (thirty) days.   cyclobenzaprine  (FLEXERIL ) 10 MG tablet Take 1 tablet (10 mg total) by mouth daily as needed for muscle spasms.   diphenoxylate -atropine  (LOMOTIL ) 2.5-0.025 MG tablet Take 1 tablet by mouth 4 (four) times daily as needed.   gabapentin  (  NEURONTIN ) 300 MG capsule Take 2 capsules (600 mg total) by mouth at bedtime.   latanoprost  (XALATAN ) 0.005 % ophthalmic solution Place 1 drop into both eyes at bedtime.    Multiple Vitamins-Minerals (MULTIVITAMIN GUMMIES ADULTS PO) Take 1 tablet by mouth daily.   pantoprazole  (PROTONIX ) 40 MG tablet Take 40 mg by mouth 2 (two) times daily.   sertraline  (ZOLOFT ) 100 MG tablet TAKE 1 TABLET(100 MG) BY MOUTH DAILY   temazepam  (RESTORIL ) 15 MG capsule TAKE 1 CAPSULE(15 MG) BY MOUTH AT BEDTIME AS NEEDED FOR SLEEP   [DISCONTINUED] fluticasone  (FLONASE ) 50 MCG/ACT nasal spray Place 1 spray into both nostrils daily as needed for allergies.   [DISCONTINUED] furosemide  (LASIX ) 20 MG tablet TAKE 1 TABLET(20 MG) BY MOUTH DAILY   [DISCONTINUED]  potassium chloride  (KLOR-CON ) 10 MEQ tablet Take 10 mEq by mouth 2 (two) times daily.   [DISCONTINUED] rizatriptan  (MAXALT -MLT) 10 MG disintegrating tablet DISSOLVE 1 TABLET BY MOUTH AS NEEDED FOR MIGRAINE. MAY REPEAT IN 2 HOURS AS NEEDED       02/11/2024    4:15 PM 11/06/2022    3:26 PM 10/05/2022    8:07 AM 10/24/2021    1:35 PM  GAD 7 : Generalized Anxiety Score  Nervous, Anxious, on Edge 0 2 2 1   Control/stop worrying 0 1 2 0  Worry too much - different things 0 2 2 1   Trouble relaxing 1 3 2 3   Restless 1 2 2  0  Easily annoyed or irritable 0 1 2 1   Afraid - awful might happen 1 2 3 1   Total GAD 7 Score 3 13 15 7   Anxiety Difficulty Somewhat difficult Somewhat difficult Very difficult        02/11/2024    4:15 PM 01/04/2024    3:00 PM 12/01/2023   11:19 AM  Depression screen PHQ 2/9  Decreased Interest 0 0 0  Down, Depressed, Hopeless 0 0 0  PHQ - 2 Score 0 0 0  Altered sleeping 1  0  Tired, decreased energy 0  0  Change in appetite 0  0  Feeling bad or failure about yourself  0  0  Trouble concentrating 0  0  Moving slowly or fidgety/restless 0  0  Suicidal thoughts 0  0  PHQ-9 Score 1  0  Difficult doing work/chores Not difficult at all  Not difficult at all    BP Readings from Last 3 Encounters:  02/11/24 116/72  01/04/24 136/87  10/14/23 (!) 142/95    Physical Exam Vitals and nursing note reviewed.  Constitutional:      General: She is not in acute distress.    Appearance: Normal appearance. She is well-developed.  HENT:     Head: Normocephalic and atraumatic.  Neck:     Vascular: No carotid bruit.  Cardiovascular:     Rate and Rhythm: Normal rate and regular rhythm.     Heart sounds: No murmur heard. Pulmonary:     Effort: Pulmonary effort is normal. No respiratory distress.     Breath sounds: No wheezing or rhonchi.  Musculoskeletal:     Cervical back: Normal range of motion.     Right lower leg: No edema.     Left lower leg: No edema.   Lymphadenopathy:     Cervical: No cervical adenopathy.  Skin:    General: Skin is warm and dry.     Capillary Refill: Capillary refill takes less than 2 seconds.     Findings: No rash.  Neurological:  General: No focal deficit present.     Mental Status: She is alert and oriented to person, place, and time.  Psychiatric:        Mood and Affect: Mood normal.        Behavior: Behavior normal.     Wt Readings from Last 3 Encounters:  02/11/24 218 lb 2 oz (98.9 kg)  01/04/24 210 lb (95.3 kg)  10/14/23 203 lb (92.1 kg)    BP 116/72   Pulse 90   Ht 5\' 7"  (1.702 m)   Wt 218 lb 2 oz (98.9 kg)   SpO2 97%   BMI 34.16 kg/m   Assessment and Plan:  Problem List Items Addressed This Visit       Unprioritized   Headache, migraine (Chronic)   No recent change in migraine headaches. Headaches respond well to current therapy with Rizatriptan . Will continue regimen;  follow up if worsening.       Relevant Medications   furosemide  (LASIX ) 20 MG tablet   rizatriptan  (MAXALT -MLT) 10 MG disintegrating tablet   B12 nutritional deficiency (Chronic)   Continues on monthly B12 injections Will check levels and CBC      Relevant Orders   Vitamin B12   Mild peripheral edema (Chronic)   Stable mild intermittent edema Continue low sodium diet, PRN lasix       Relevant Medications   furosemide  (LASIX ) 20 MG tablet   Other Relevant Orders   TSH   Primary insomnia (Chronic)   Doing fairly well on Temazepam  nightly. No evidence if misuse or abuse.      Generalized anxiety disorder (Chronic)   Clinically stable on Sertraline .   No SI or HI on evaluation. Sleep is adequate on Temazepam . Plan to continue same medications for now.       Primary hypertension - Primary   Blood pressure is well controlled.  Current medications are prn Lasix . Will continue same regimen along with efforts to limit dietary sodium.       Relevant Medications   furosemide  (LASIX ) 20 MG tablet    Other Relevant Orders   CBC with Differential/Platelet   Comprehensive metabolic panel with GFR    Return in about 6 months (around 08/13/2024) for CPX.    Sheron Dixons, MD [facility] Sports Medicine [place]

## 2024-02-11 NOTE — Assessment & Plan Note (Signed)
 Stable mild intermittent edema Continue low sodium diet, PRN lasix 

## 2024-02-12 LAB — CBC WITH DIFFERENTIAL/PLATELET
Basophils Absolute: 0 10*3/uL (ref 0.0–0.2)
Basos: 1 %
EOS (ABSOLUTE): 0.2 10*3/uL (ref 0.0–0.4)
Eos: 4 %
Hematocrit: 35.4 % (ref 34.0–46.6)
Hemoglobin: 11.2 g/dL (ref 11.1–15.9)
Immature Grans (Abs): 0 10*3/uL (ref 0.0–0.1)
Immature Granulocytes: 0 %
Lymphocytes Absolute: 2.3 10*3/uL (ref 0.7–3.1)
Lymphs: 57 %
MCH: 29.9 pg (ref 26.6–33.0)
MCHC: 31.6 g/dL (ref 31.5–35.7)
MCV: 94 fL (ref 79–97)
Monocytes Absolute: 0.3 10*3/uL (ref 0.1–0.9)
Monocytes: 7 %
Neutrophils Absolute: 1.3 10*3/uL — ABNORMAL LOW (ref 1.4–7.0)
Neutrophils: 31 %
Platelets: 191 10*3/uL (ref 150–450)
RBC: 3.75 x10E6/uL — ABNORMAL LOW (ref 3.77–5.28)
RDW: 12.2 % (ref 11.7–15.4)
WBC: 4.1 10*3/uL (ref 3.4–10.8)

## 2024-02-12 LAB — COMPREHENSIVE METABOLIC PANEL WITH GFR
ALT: 54 IU/L — ABNORMAL HIGH (ref 0–32)
AST: 43 IU/L — ABNORMAL HIGH (ref 0–40)
Albumin: 3.8 g/dL (ref 3.8–4.9)
Alkaline Phosphatase: 119 IU/L (ref 44–121)
BUN/Creatinine Ratio: 12 (ref 9–23)
BUN: 10 mg/dL (ref 6–24)
Bilirubin Total: 0.4 mg/dL (ref 0.0–1.2)
CO2: 24 mmol/L (ref 20–29)
Calcium: 9.7 mg/dL (ref 8.7–10.2)
Chloride: 107 mmol/L — ABNORMAL HIGH (ref 96–106)
Creatinine, Ser: 0.86 mg/dL (ref 0.57–1.00)
Globulin, Total: 2.5 g/dL (ref 1.5–4.5)
Glucose: 121 mg/dL — ABNORMAL HIGH (ref 70–99)
Potassium: 3.8 mmol/L (ref 3.5–5.2)
Sodium: 145 mmol/L — ABNORMAL HIGH (ref 134–144)
Total Protein: 6.3 g/dL (ref 6.0–8.5)
eGFR: 80 mL/min/{1.73_m2} (ref >59)

## 2024-02-12 LAB — VITAMIN B12: Vitamin B-12: 760 pg/mL (ref 232–1245)

## 2024-02-12 LAB — TSH: TSH: 1.33 u[IU]/mL (ref 0.450–4.500)

## 2024-02-13 ENCOUNTER — Ambulatory Visit: Payer: Self-pay | Admitting: Internal Medicine

## 2024-02-15 NOTE — Telephone Encounter (Signed)
 Requested Prescriptions  Pending Prescriptions Disp Refills   furosemide  (LASIX ) 20 MG tablet [Pharmacy Med Name: FUROSEMIDE  20MG  TABLETS] 90 tablet 0    Sig: TAKE 1 TABLET BY MOUTH DAILY AS NEEDED     Cardiovascular:  Diuretics - Loop Failed - 02/15/2024  8:47 AM      Failed - Na in normal range and within 180 days    Sodium  Date Value Ref Range Status  02/11/2024 145 (H) 134 - 144 mmol/L Final  10/31/2014 138 136 - 145 mmol/L Final         Failed - Cl in normal range and within 180 days    Chloride  Date Value Ref Range Status  02/11/2024 107 (H) 96 - 106 mmol/L Final  10/31/2014 104 98 - 107 mmol/L Final         Failed - Mg Level in normal range and within 180 days    Magnesium  Date Value Ref Range Status  12/07/2022 2.0 1.7 - 2.4 mg/dL Final    Comment:    Performed at [facility], [address] [place], [place] [address]         Passed - K in normal range and within 180 days    Potassium  Date Value Ref Range Status  02/11/2024 3.8 3.5 - 5.2 mmol/L Final  10/31/2014 3.8 3.5 - 5.1 mmol/L Final         Passed - Ca in normal range and within 180 days    Calcium  Date Value Ref Range Status  02/11/2024 9.7 8.7 - 10.2 mg/dL Final   Calcium, Total  Date Value Ref Range Status  10/31/2014 9.1 8.5 - 10.1 mg/dL Final         Passed - Cr in normal range and within 180 days    Creatinine  Date Value Ref Range Status  10/31/2014 0.69 0.60 - 1.30 mg/dL Final   Creatinine, Ser  Date Value Ref Range Status  02/11/2024 0.86 0.57 - 1.00 mg/dL Final         Passed - Last BP in normal range    BP Readings from Last 1 Encounters:  02/11/24 116/72         Passed - Valid encounter within last 6 months    Recent Outpatient Visits           4 days ago Primary hypertension   [place] [facility] at [facility], Chales [place], MD       Future Appointments             In 6 months Gala Jubilee [place], MD [facility]  Care & Sports Medicine at [facility], [facility]

## 2024-02-22 ENCOUNTER — Other Ambulatory Visit: Payer: Self-pay | Admitting: Internal Medicine

## 2024-02-22 DIAGNOSIS — Z1231 Encounter for screening mammogram for malignant neoplasm of breast: Secondary | ICD-10-CM

## 2024-02-25 ENCOUNTER — Ambulatory Visit
Admission: RE | Admit: 2024-02-25 | Discharge: 2024-02-25 | Disposition: A | Discharge status: Home / Self Care | Source: Ambulatory Visit

## 2024-02-25 DIAGNOSIS — Z1231 Encounter for screening mammogram for malignant neoplasm of breast: Secondary | ICD-10-CM | POA: Diagnosis not present

## 2024-03-04 ENCOUNTER — Other Ambulatory Visit: Payer: Self-pay | Admitting: Internal Medicine

## 2024-03-04 DIAGNOSIS — F411 Generalized anxiety disorder: Secondary | ICD-10-CM

## 2024-03-06 NOTE — Telephone Encounter (Signed)
 Requested Prescriptions  Pending Prescriptions Disp Refills   sertraline  (ZOLOFT ) 100 MG tablet [Pharmacy Med Name: SERTRALINE  100MG  TABLETS] 90 tablet 1    Sig: TAKE 1 TABLET(100 MG) BY MOUTH DAILY     Psychiatry:  Antidepressants - SSRI - sertraline  Failed - 03/06/2024  2:07 PM      Failed - AST in normal range and within 360 days    AST  Date Value Ref Range Status  02/11/2024 43 (H) 0 - 40 IU/L Final   SGOT(AST)  Date Value Ref Range Status  10/31/2014 17 15 - 37 Unit/L Final         Failed - ALT in normal range and within 360 days    ALT  Date Value Ref Range Status  02/11/2024 54 (H) 0 - 32 IU/L Final   SGPT (ALT)  Date Value Ref Range Status  10/31/2014 16 14 - 63 U/L Final         Passed - Completed PHQ-2 or PHQ-9 in the last 360 days      Passed - Valid encounter within last 6 months    Recent Outpatient Visits           3 weeks ago Primary hypertension   [place] [facility] at [facility], Chales [place], MD       Future Appointments             In 5 months Gala Jubilee, Chales [place], MD [facility] & [facility] Medicine at [facility], [facility]

## 2024-03-08 ENCOUNTER — Other Ambulatory Visit: Payer: Self-pay | Admitting: Internal Medicine

## 2024-03-08 DIAGNOSIS — E538 Deficiency of other specified B group vitamins: Secondary | ICD-10-CM

## 2024-03-09 ENCOUNTER — Encounter: Payer: Self-pay | Admitting: Internal Medicine

## 2024-03-09 NOTE — Telephone Encounter (Signed)
 Please review.  KP

## 2024-03-09 NOTE — Telephone Encounter (Signed)
 Requested Prescriptions  Pending Prescriptions Disp Refills   cyanocobalamin  (VITAMIN B12) 1000 MCG/ML injection [Pharmacy Med Name: CYANOCOBALAMIN  1000MCG/ML INJ, 1ML] 1 mL 5    Sig: ADMINISTER 1 ML(1000 MCG) IN THE MUSCLE EVERY 30 DAYS     Endocrinology:  Vitamins - Vitamin B12 Passed - 03/09/2024  1:59 PM      Passed - HCT in normal range and within 360 days    Hematocrit  Date Value Ref Range Status  02/11/2024 35.4 34.0 - 46.6 % Final         Passed - HGB in normal range and within 360 days    Hemoglobin  Date Value Ref Range Status  02/11/2024 11.2 11.1 - 15.9 g/dL Final         Passed - B12 Level in normal range and within 360 days    Vitamin B-12  Date Value Ref Range Status  02/11/2024 760 232 - 1,245 pg/mL Final         Passed - Valid encounter within last 12 months    Recent Outpatient Visits           3 weeks ago Primary hypertension   [facility] at [facility], Chales [place], MD       Future Appointments             In 5 months Gala Jubilee, Chales Colorado, MD [facility] at [facility], [facility]

## 2024-03-10 ENCOUNTER — Encounter: Payer: Self-pay | Admitting: Podiatry

## 2024-03-10 ENCOUNTER — Ambulatory Visit (INDEPENDENT_AMBULATORY_CARE_PROVIDER_SITE_OTHER): Admitting: Podiatry

## 2024-03-10 VITALS — Ht 67.0 in | Wt 218.1 lb

## 2024-03-10 DIAGNOSIS — L600 Ingrowing nail: Secondary | ICD-10-CM | POA: Diagnosis not present

## 2024-03-10 NOTE — Progress Notes (Signed)
 Chief Complaint  Patient presents with   Nail Problem    Pt is here due to right great toe states the skin on the toe peels a lot and if she hit or rub her toe against anything it hurts really bad.    Subjective: Patient presents today for evaluation of pain to the medial border right great toe. Patient is concerned for possible ingrown nail.  It is very sensitive to touch.  Patient presents today for further treatment and evaluation.  Past Medical History:  Diagnosis Date   Acute respiratory failure with hypoxia (HCC) 12/31/2022   Allergy 1999   SEASONAL   Anemia    Anxiety    Arm pain, left 05/17/2018   Arthritis    lower spine   Bilateral lumbar radiculopathy    Cellulitis of abdominal wall 03/03/2021   Chronic back pain    Chronic GERD 03/03/2021   Clinical depression 04/04/2012   Depression    DVT (deep venous thrombosis) (HCC)    Encounter for screening colonoscopy    Essential (primary) hypertension 06/04/2020   Glaucoma    RIGHT EYE   Headache(784.0)    migraines   Major depressive disorder with single episode, in full remission (HCC) 08/15/2018   Malfunction of jejunostomy tube (HCC) 02/13/2021   Ovarian cyst    Pneumonia    walking pneumonia   Pneumonia of left lower lobe due to infectious organism 12/07/2022   Pulmonary embolism (HCC) 05/2017   Provoked, following knee surgery   S/P laparoscopic cholecystectomy 02/07/2021   Severe sepsis (HCC) 12/07/2022   Sleep apnea    no cpcap     last sleep study > 4 yrs   Vasomotor symptoms due to menopause 11/11/2016    Past Surgical History:  Procedure Laterality Date   ABDOMINAL HYSTERECTOMY  10/2006   BACK SURGERY  2014   lumbar fusion   BACK SURGERY  2009   discectomy   BARIATRIC SURGERY  07/2020   Revision   CESAREAN SECTION  1989 1995   CHOLECYSTECTOMY  01/2021   COLONOSCOPY WITH PROPOFOL  N/A 04/12/2018   Procedure: COLONOSCOPY WITH PROPOFOL ;  Surgeon: Selena Daily, MD;  Location: [facility]  ENDOSCOPY;  Service: Gastroenterology;  Laterality: N/A;   GASTRIC BYPASS  2005   KNEE ARTHROSCOPY WITH LATERAL MENISECTOMY Left 05/27/2017   Procedure: KNEE ARTHROSCOPY WITH LATERAL MENISECTOMY;  Surgeon: Molli Angelucci, MD;  Location: [facility] ORS;  Service: Orthopedics;  Laterality: Left;   KNEE ARTHROSCOPY WITH LATERAL RELEASE Left 05/27/2017   Procedure: KNEE ARTHROSCOPY WITH LATERAL RELEASE;  Surgeon: Molli Angelucci, MD;  Location: [facility] ORS;  Service: Orthopedics;  Laterality: Left;   LAPAROSCOPIC SALPINGO OOPHERECTOMY Bilateral 10/05/2016   Procedure: LAPAROSCOPIC SALPINGO OOPHORECTOMY;  Surgeon: Colan Dash, MD;  Location: [facility] ORS;  Service: Gynecology;  Laterality: Bilateral;   REDUCTION MAMMAPLASTY Bilateral    2001   SPINAL CORD STIMULATOR INSERTION N/A 03/20/2016   Procedure: LUMBAR SPINAL CORD STIMULATOR INSERTION;  Surgeon: Isadora Mar, MD;  Location: [facility];  Service: Neurosurgery;  Laterality: N/A;   SPINE SURGERY  2009 2014 2017   TONSILLECTOMY      Allergies  Allergen Reactions   Morphine  And Codeine  Hives, Shortness Of Breath, Nausea And Vomiting and Other (See Comments)    can't breathe   Penicillins Anaphylaxis, Hives, Nausea And Vomiting, Shortness Of Breath and Rash    can't breathe, Has patient had a PCN reaction causing immediate rash, facial/tongue/throat swelling, SOB or lightheadedness with hypotension: Yes  Has patient had a PCN reaction causing severe rash involving mucus membranes or skin necrosis: No  Has patient had a PCN reaction that required hospitalization No  Has patient had a PCN reaction occurring within the last 10 years: No  If all of the above answers are NO, then may proceed with Cephalosporin use.  Other Reaction(s): wheezing   Latex Hives   Baclofen  Nausea Only    Jittery, anxious    Objective:  General: Well developed, nourished, in no acute distress, alert and oriented x3   Dermatology: Skin is warm, dry and  supple bilateral.  Medial border right great toe is tender with evidence of an ingrowing nail. Pain on palpation noted to the border of the nail fold. The remaining nails appear unremarkable at this time.   Vascular: DP and PT pulses palpable.  No clinical evidence of vascular compromise  Neruologic: Grossly intact via light touch bilateral.  Musculoskeletal: No pedal deformity noted  Assesement: #1 Paronychia with ingrowing nail medial border right great toe  Plan of Care:  -Patient evaluated.  -Discussed treatment alternatives and plan of care. Explained nail avulsion procedure and post procedure course to patient. -Patient opted for permanent partial nail avulsion of the ingrown portion of the nail.  -Prior to procedure, local anesthesia infiltration utilized using 3 ml of a 50:50 mixture of 2% plain lidocaine  and 0.5% plain marcaine  in a normal hallux block fashion and a betadine prep performed.  -Partial permanent nail avulsion with chemical matrixectomy performed using 3x30sec applications of phenol followed by alcohol flush.  -Light dressing applied.  Post care instructions provided -Return to clinic 3 weeks  Dot Gazella, DPM [facility]  Dr. Dot Gazella, DPM    [address] [place], [place] [address]                Office [phone]  Fax [phone]

## 2024-03-24 DIAGNOSIS — M1712 Unilateral primary osteoarthritis, left knee: Secondary | ICD-10-CM | POA: Diagnosis not present

## 2024-03-28 ENCOUNTER — Ambulatory Visit
Attending: Student in an Organized Health Care Education/Training Program | Admitting: Student in an Organized Health Care Education/Training Program

## 2024-03-28 ENCOUNTER — Encounter: Payer: Self-pay | Admitting: Student in an Organized Health Care Education/Training Program

## 2024-03-28 VITALS — BP 157/84 | HR 77 | Temp 97.4°F | Resp 16 | Ht 67.0 in | Wt 218.0 lb

## 2024-03-28 DIAGNOSIS — M5416 Radiculopathy, lumbar region: Secondary | ICD-10-CM | POA: Diagnosis not present

## 2024-03-28 DIAGNOSIS — G894 Chronic pain syndrome: Secondary | ICD-10-CM | POA: Insufficient documentation

## 2024-03-28 DIAGNOSIS — M961 Postlaminectomy syndrome, not elsewhere classified: Secondary | ICD-10-CM | POA: Insufficient documentation

## 2024-03-28 DIAGNOSIS — Z981 Arthrodesis status: Secondary | ICD-10-CM | POA: Insufficient documentation

## 2024-03-28 DIAGNOSIS — Z9689 Presence of other specified functional implants: Secondary | ICD-10-CM | POA: Diagnosis not present

## 2024-03-28 DIAGNOSIS — G8929 Other chronic pain: Secondary | ICD-10-CM | POA: Diagnosis present

## 2024-03-28 DIAGNOSIS — Z79891 Long term (current) use of opiate analgesic: Secondary | ICD-10-CM | POA: Insufficient documentation

## 2024-03-28 MED ORDER — GABAPENTIN 300 MG PO CAPS: 600.0000 mg | ORAL_CAPSULE | Freq: Every day | ORAL | 5 refills | Status: DC

## 2024-03-28 MED ORDER — HYDROCODONE-ACETAMINOPHEN 10-325 MG PO TABS: 1.0000 | ORAL_TABLET | Freq: Three times a day (TID) | ORAL | 0 refills | Status: AC | PRN

## 2024-03-28 NOTE — Progress Notes (Signed)
 PROVIDER NOTE: Information contained herein reflects review and annotations entered in association with encounter. Interpretation of such information and data should be left to medically-trained personnel. Information provided to patient can be located elsewhere in the medical record under Patient Instructions. Document created using STT-dictation technology, any transcriptional errors that may result from process are unintentional.    Patient: Cowan Cowan  Service Category: E/M  Provider: Wallie Sherry, MD  DOB: May 18, 1966  DOS: 03/28/2024  Referring Provider: Justus Leita DEL, MD  MRN: [id number]  Specialty: Interventional Pain Management  PCP: Justus Leita DEL, MD  Type: Established Patient  Setting: Ambulatory outpatient    Location: Office  Delivery: Face-to-face     HPI  Cowan Cowan, a 58 y.o. 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 Cowan Cowan primary complain today is Back Pain (Bilateral lumbar ) Last encounter: My last encounter with her was on 10/14/23 Pertinent problems: Cowan Cowan has Failed back surgical syndrome; Spinal stenosis of lumbar region; Spinal cord stimulator status [employer], implanted 2017); History of lumbar fusion (L5-S1 PSIF); Encounter for long-term opiate analgesic use; Chronic pain syndrome; and Pain management contract signed on their pertinent problem list. Pain Assessment: Severity of Chronic pain is reported as a 7 /10. Location: Back Lower, Left, Right/down both legs to the toes. Onset: More than a month ago. Quality: Discomfort, Constant, Throbbing, Numbness, Tingling, Shooting. Timing: Constant. Modifying factor(s): SCS intensity, rest and pain medications. Vitals:  vitals were not taken for this visit.   Reason for encounter: medication management.   Discussed the use of AI scribe software for clinical note transcription with the patient, who gave verbal  consent to proceed.  History of Present Illness   Cowan Cowan is a 58 year old female who presents with increased falls and numbness in her leg.  She began experiencing falls approximately 3 weeks ago, which she attributes to her leg going numb. She describes the sensation as a 'shock' when it occurs and denies tripping over anything.  She has a history of lumbar fusion at L5 S1 and has undergone approximately eighteen epidural injections before her surgery. Despite adjusting her stimulator, the numbness and falls persist. She continues to experience low back pain.  Her current medications include gabapentin  and Flexeril .        12/24/22 Was recently hospitalized for PNA- IV abx and IV fluids. Was on oxygen at the hospital at 4L, weaned down at discharge. Patient's pain is at baseline.  Patient continues multimodal pain regimen as prescribed.  States that it provides pain relief and improvement in functional status.  05/07/22: Second patient visit today.  She has completed her urine toxicology screen which is appropriate.  I will have her sign pain contract and take her on for chronic pain management.  I will send in a prescription for hydrocodone  7.5 mg nightly.  I will see her back in 4 weeks for medication management.   Please see initial clinic visit HPI below Cowan is a pleasant 58 year old female with a history of L4-S1 spinal fusion with [employer] spinal cord stimulator in place who is hoping to establish with pain management as her previous pain provider left [facility] and is no longer excepting new patients.  She states that she has done physical therapy in the past tried various medications as well as injections with limited response.  She had her spinal cord stimulator implanted in 2017 with [employer] which was  beneficial for her and allowed her to wean her hydrocodone  to just once at night.  She states that she is able to manage with  7.5 mg nightly.  She is hoping to have this continued.  She also has restless leg syndrome and obesity.  She also has obstructive sleep apnea.  Given that she is on low-dose and has been compliant with previous pain clinic protocol, I will have her obtain baseline urine toxicology screen which is customary for new patients and I will see her back in approximately 7 to 10 days to sign pain contract and take her on for medication   Pharmacotherapy Assessment  Analgesic:  Hydrocodone  10 mg nightly as needed #90/3 months  Monitoring: [place] PMP: PDMP reviewed during this encounter.       Pharmacotherapy: No side-effects or adverse reactions reported. Compliance: No problems identified. Effectiveness: Clinically acceptable.  Dayna Pulling, RN  03/28/2024  2:50 PM  Sign when Signing Visit Nursing Pain Medication Assessment:  Safety precautions to be maintained throughout the outpatient stay will include: orient to surroundings, keep bed in low position, maintain call bell within reach at all times, provide assistance with transfer out of bed and ambulation.  Medication Inspection Compliance: Cowan Cowan did not comply with our request to bring her pills to be counted. She was reminded that bringing the medication bottles, even when empty, is a requirement.  Medication: None brought in. Pill/Patch Count: None available to be counted. Bottle Appearance: No container available. Did not bring bottle(s) to appointment. Filled Date: N/A Last Medication intake:  Today  No results found for: CBDTHCR No results found for: D8THCCBX No results found for: D9THCCBX  UDS:  Summary  Date Value Ref Range Status  07/15/2023 Note  Final    Comment:    ==================================================================== [employer] Select 13 (MW) ==================================================================== Test                             Result       Flag       Units  Drug Present and Declared  for Prescription Verification   Oxazepam                       762          EXPECTED   ng/mg creat   Temazepam                       >2597        EXPECTED   ng/mg creat    Oxazepam and temazepam  are expected metabolites of diazepam .    Oxazepam is also an expected metabolite of other benzodiazepine    drugs, including chlordiazepoxide, prazepam, clorazepate, halazepam,    and temazepam .  Oxazepam and temazepam  are available as scheduled    prescription medications.    Hydrocodone                     199          EXPECTED   ng/mg creat   Hydromorphone                   71           EXPECTED   ng/mg creat   Dihydrocodeine                 70           EXPECTED  ng/mg creat   Norhydrocodone                 303          EXPECTED   ng/mg creat    Sources of hydrocodone  include scheduled prescription medications.    Hydromorphone , dihydrocodeine and norhydrocodone are expected    metabolites of hydrocodone . Hydromorphone  and dihydrocodeine are    also available as scheduled prescription medications.  ==================================================================== Test                      Result    Flag   Units      Ref Range   Creatinine              77               mg/dL      >=79 ==================================================================== Declared Medications:  The flagging and interpretation on this report are based on the  following declared medications.  Unexpected results may arise from  inaccuracies in the declared medications.   **Note: The testing scope of this panel includes these medications:   Hydrocodone  (Norco)  Temazepam  (Restoril )   **Note: The testing scope of this panel does not include the  following reported medications:   Acetaminophen  (Norco)  Atropine  (Lomotil )  Colestipol (Colestid)  Cyclobenzaprine  (Flexeril )  Diphenoxylate  (Lomotil )  Fluticasone  (Flonase )  Furosemide  (Lasix )  Gabapentin   Latanoprost  (Xalatan )  Metronidazole  (Flagyl )   Multivitamin  Pantoprazole  (Protonix )  Potassium (Klor-Con )  Rizatriptan  (Maxalt )  Sertraline  (Zoloft )  Vitamin B12 ==================================================================== For clinical consultation, please call [phone]. ====================================================================       ROS  Constitutional: Denies any fever or chills Gastrointestinal: No reported hemesis, hematochezia, vomiting, or acute GI distress Musculoskeletal: + LBP Neurological: No reported episodes of acute onset apraxia, aphasia, dysarthria, agnosia, amnesia, paralysis, loss of coordination, or loss of consciousness  Medication Review  HYDROcodone -acetaminophen , Multiple Vitamins-Minerals, cyanocobalamin , cyclobenzaprine , diphenoxylate -atropine , fluticasone , furosemide , gabapentin , latanoprost , pantoprazole , potassium chloride , rizatriptan , sertraline , and temazepam   History Review  Allergy: Cowan Cowan is allergic to morphine  and codeine , penicillins, latex, and baclofen . Drug: Cowan Cowan  reports no history of drug use. Alcohol:  reports no history of alcohol use. Tobacco:  reports that she has never smoked. She has never used smokeless tobacco. Social: Cowan Cowan  reports that she has never smoked. She has never used smokeless tobacco. She reports that she does not drink alcohol and does not use drugs. Medical:  has a past medical history of Acute respiratory failure with hypoxia (HCC) (12/31/2022), Allergy (1999), Anemia, Anxiety, Arm pain, left (05/17/2018), Arthritis, Bilateral lumbar radiculopathy, Cellulitis of abdominal wall (03/03/2021), Chronic back pain, Chronic GERD (03/03/2021), Clinical depression (04/04/2012), Depression, DVT (deep venous thrombosis) (HCC), Encounter for screening colonoscopy, Essential (primary) hypertension (06/04/2020), Glaucoma, Headache(784.0), Major depressive disorder with single episode, in full remission (HCC) (08/15/2018),  Malfunction of jejunostomy tube (HCC) (02/13/2021), Ovarian cyst, Pneumonia, Pneumonia of left lower lobe due to infectious organism (12/07/2022), Pulmonary embolism (HCC) (05/2017), S/P laparoscopic cholecystectomy (02/07/2021), Severe sepsis (HCC) (12/07/2022), Sleep apnea, and Vasomotor symptoms due to menopause (11/11/2016). Surgical: Ms. Swarm  has a past surgical history that includes Tonsillectomy; Gastric bypass (2005); Reduction mammaplasty (Bilateral); Spinal cord stimulator insertion (N/A, 03/20/2016); Abdominal hysterectomy (10/2006); Laparoscopic salpingo oophorectomy (Bilateral, 10/05/2016); Back surgery (2014); Back surgery (2009); Colonoscopy with propofol  (N/A, 04/12/2018); Cesarean section (1989 1995); Spine surgery (2009 2014 2017); Knee arthroscopy with lateral menisectomy (Left, 05/27/2017); Knee arthroscopy with lateral release (Left, 05/27/2017); Bariatric Surgery (07/2020); and  Cholecystectomy (01/2021). Family: family history includes Arthritis in her father, mother, sister, and sister; Cancer in her father and maternal aunt; Diabetes in her maternal aunt, maternal grandmother, mother, and sister; Hypertension in her father, mother, and sister; Obesity in her daughter, father, mother, sister, and sister; Varicose Veins in her mother.  Laboratory Chemistry Profile   Renal Lab Results  Component Value Date   BUN 10 02/11/2024   CREATININE 0.86 02/11/2024   BCR 12 02/11/2024   GFRAA 100 07/24/2020   GFRNONAA >60 12/10/2022    Hepatic Lab Results  Component Value Date   AST 43 (H) 02/11/2024   ALT 54 (H) 02/11/2024   ALBUMIN  3.8 02/11/2024   ALKPHOS 119 02/11/2024   LIPASE 25 12/07/2022    Electrolytes Lab Results  Component Value Date   NA 145 (H) 02/11/2024   K 3.8 02/11/2024   CL 107 (H) 02/11/2024   CALCIUM 9.7 02/11/2024   MG 2.0 12/07/2022   PHOS 2.8 12/08/2022    Bone Lab Results  Component Value Date   VD25OH 9 02/19/2022    Inflammation (CRP:  Acute Phase) (ESR: Chronic Phase) Lab Results  Component Value Date   LATICACIDVEN 1.6 12/08/2022         Note: Above Lab results reviewed.  Recent Imaging Review  MM 3D SCREENING MAMMOGRAM BILATERAL BREAST CLINICAL DATA:  Screening.  EXAM: DIGITAL SCREENING BILATERAL MAMMOGRAM WITH TOMOSYNTHESIS AND CAD  TECHNIQUE: Bilateral screening digital craniocaudal and mediolateral oblique mammograms were obtained. Bilateral screening digital breast tomosynthesis was performed. The images were evaluated with computer-aided detection.  COMPARISON:  Previous exam(s).  ACR Breast Density Category b: There are scattered areas of fibroglandular density.  FINDINGS: There are no findings suspicious for malignancy.  IMPRESSION: No mammographic evidence of malignancy. A result letter of this screening mammogram will be mailed directly to the patient.  RECOMMENDATION: Screening mammogram in one year. (Code:SM-B-01Y)  BI-RADS CATEGORY  1: Negative.  Electronically Signed   By: Corean Salter M.D.   On: 03/02/2024 09:37 Note: Reviewed        Physical Exam  General appearance: Well nourished, well developed, and well hydrated. In no apparent acute distress Mental status: Alert, oriented x 3 (person, place, & time)       Respiratory: No evidence of acute respiratory distress Eyes: PERLA Vitals: There were no vitals taken for this visit. BMI: Estimated body mass index is 34.16 kg/m as calculated from the following:   Height as of 03/10/24: 5' 7 (1.702 m).   Weight as of 03/10/24: 218 lb 2.1 oz (98.9 kg). Ideal: Patient weight not recorded    Thoracic Spine Area Exam  Skin & Axial Inspection: Well healed scar from previous spine surgery detected Alignment: Symmetrical Functional ROM: Pain restricted ROM Stability: No instability detected Muscle Tone/Strength: Functionally intact. No obvious neuro-muscular anomalies detected. Sensory (Neurological): Neurogenic pain  pattern Muscle strength & Tone: No palpable anomalies Lumbar Spine Area Exam  Skin & Axial Inspection: Well healed scar from previous spine surgery detected spinal cord stimulator IPG  along the left flank Alignment: Symmetrical Functional ROM: Pain restricted ROM       Stability: No instability detected Muscle Tone/Strength: Functionally intact. No obvious neuro-muscular anomalies detected. Sensory (Neurological): Dermatomal pain pattern   Gait & Posture Assessment  Ambulation: Limited Gait: Antalgic gait (limping) Posture: Difficulty standing up straight, due to pain  Lower Extremity Exam      Side: Right lower extremity   Side: Left lower extremity  Stability: No  instability observed           Stability: No instability observed          Skin & Extremity Inspection: Skin color, temperature, and hair growth are WNL. No peripheral edema or cyanosis. No masses, redness, swelling, asymmetry, or associated skin lesions. No contractures.   Skin & Extremity Inspection: Skin color, temperature, and hair growth are WNL. No peripheral edema or cyanosis. No masses, redness, swelling, asymmetry, or associated skin lesions. No contractures.  Functional ROM: Unrestricted ROM                   Functional ROM: Unrestricted ROM                  Muscle Tone/Strength: Functionally intact. No obvious neuro-muscular anomalies detected.   Muscle Tone/Strength: Functionally intact. No obvious neuro-muscular anomalies detected.  Sensory (Neurological): Musculoskeletal pain pattern         Sensory (Neurological): Musculoskeletal pain pattern        DTR: Patellar: deferred today Achilles: deferred today Plantar: deferred today   DTR: Patellar: deferred today Achilles: deferred today Plantar: deferred today  Palpation: No palpable anomalies   Palpation: No palpable anomalies       Assessment   Diagnosis  1. Failed back surgical syndrome   2. Spinal cord stimulator status   3. Chronic radicular lumbar  pain   4. History of lumbar fusion   5. Encounter for long-term opiate analgesic use   6. Chronic pain syndrome         Plan of Care    Cowan Cowan has a current medication list which includes the following long-term medication(s): fluticasone , furosemide , potassium chloride , rizatriptan , sertraline , temazepam , and gabapentin .  Assessment and Plan    SI joint arthritis   She experiences pain at the sacroiliac joints where the sacrum connects with the hip bone. Consider SI joint injection with local anesthesia and optional IV sedation.  Lumbar fusion at L5-S1 with adjacent segment disease   There is compression at the L4 nerve root above the fusion site. She has had multiple epidurals in the past and prefers to wait before seeing another Careers adviser. Ablation was discussed as a potential longer-term solution if nerve blocks are effective. Consider epidural injection at L4 if symptoms worsen. Discuss surgical consultation if the condition deteriorates. Consider ablation if nerve blocks are effective.  Intermittent leg numbness   Intermittent numbness in the leg, possibly related to nerve compression, occurs without a clear trigger and is not associated with the spinal stimulator. Consider nerve blocks at L3 and L4 if symptoms persist.        Pharmacotherapy (Medications Ordered): Meds ordered this encounter  Medications   HYDROcodone -acetaminophen  (NORCO) 10-325 MG tablet    Sig: Take 1 tablet by mouth every 8 (eight) hours as needed.    Dispense:  90 tablet    Refill:  0   gabapentin  (NEURONTIN ) 300 MG capsule    Sig: Take 2 capsules (600 mg total) by mouth at bedtime.    Dispense:  60 capsule    Refill:  5  Continue with spinal cord stimulation   Orders Placed This Encounter  Procedures   Tox[employer]SURE Select 13 (MW), Urine    Volume: 30 ml(s). Minimum 3 ml of urine is needed. Document temperature of fresh sample. Indications: Long term (current) use of  opiate analgesic (S20.108)    Release to patient:   Immediate     Follow-up plan:   Return  in about 3 months (around 06/28/2024) for MM, F2F.    I discussed the assessment and treatment plan with the patient. The patient was provided an opportunity to ask questions and all were answered. The patient agreed with the plan and demonstrated an understanding of the instructions.  Patient advised to call back or seek an in-person evaluation if the symptoms or condition worsens.  Duration of encounter: .  Total time on encounter, as per AMA guidelines included both the face-to-face and non-face-to-face time personally spent by the physician and/or other qualified health care professional(s) on the day of the encounter (includes time in activities that require the physician or other qualified health care professional and does not include time in activities normally performed by clinical staff). Physician's time may include the following activities when performed: preparing to see the patient (eg, review of tests, pre-charting review of records) obtaining and/or reviewing separately obtained history performing a medically appropriate examination and/or evaluation counseling and educating the patient/family/caregiver ordering medications, tests, or procedures referring and communicating with other health care professionals (when not separately reported) documenting clinical information in the electronic or other health record independently interpreting results (not separately reported) and communicating results to the patient/ family/caregiver care coordination (not separately reported)  Note by: Wallie Sherry, MD Date: 03/28/2024; Time: 3:13 PM

## 2024-03-28 NOTE — Progress Notes (Signed)
 Nursing Pain Medication Assessment:  Safety precautions to be maintained throughout the outpatient stay will include: orient to surroundings, keep bed in low position, maintain call bell within reach at all times, provide assistance with transfer out of bed and ambulation.  Medication Inspection Compliance: Ms. Coyne did not comply with our request to bring her pills to be counted. She was reminded that bringing the medication bottles, even when empty, is a requirement.  Medication: None brought in. Pill/Patch Count: None available to be counted. Bottle Appearance: No container available. Did not bring bottle(s) to appointment. Filled Date: N/A Last Medication intake:  Today

## 2024-03-30 LAB — TOXASSURE SELECT 13 (MW), URINE

## 2024-04-05 ENCOUNTER — Telehealth: Payer: Self-pay | Admitting: Student in an Organized Health Care Education/Training Program

## 2024-04-05 NOTE — Telephone Encounter (Signed)
 PT called stated that that SCS place back in 2017, haven't seen a rep in a while . PT stated that she feel it needs some adjustments done. PT wants to see when a rep can meet with. I reach out to [facility] she will be giving patient a call to see when she is available to meet here. Marjorie will give me a call back to confirm the date and time.TY

## 2024-04-05 NOTE — Telephone Encounter (Signed)
 PT call back stated that she will meet in our office next Tuesday at 2pm with reps from [employer]. FYI

## 2024-04-08 ENCOUNTER — Other Ambulatory Visit: Payer: Self-pay | Admitting: Internal Medicine

## 2024-04-08 DIAGNOSIS — R600 Localized edema: Secondary | ICD-10-CM

## 2024-04-10 NOTE — Telephone Encounter (Signed)
 Requested Prescriptions  Refused Prescriptions Disp Refills   furosemide  (LASIX ) 20 MG tablet [Pharmacy Med Name: FUROSEMIDE  20MG  TABLETS] 90 tablet 0    Sig: TAKE 1 TABLET BY MOUTH DAILY AS NEEDED     Cardiovascular:  Diuretics - Loop Failed - 04/10/2024  4:36 PM      Failed - Na in normal range and within 180 days    Sodium  Date Value Ref Range Status  02/11/2024 145 (H) 134 - 144 mmol/L Final  10/31/2014 138 136 - 145 mmol/L Final         Failed - Cl in normal range and within 180 days    Chloride  Date Value Ref Range Status  02/11/2024 107 (H) 96 - 106 mmol/L Final  10/31/2014 104 98 - 107 mmol/L Final         Failed - Mg Level in normal range and within 180 days    Magnesium  Date Value Ref Range Status  12/07/2022 2.0 1.7 - 2.4 mg/dL Final    Comment:    Performed at [facility], [address] [place], [place] [address]         Failed - Last BP in normal range    BP Readings from Last 1 Encounters:  03/28/24 (!) 157/84         Passed - K in normal range and within 180 days    Potassium  Date Value Ref Range Status  02/11/2024 3.8 3.5 - 5.2 mmol/L Final  10/31/2014 3.8 3.5 - 5.1 mmol/L Final         Passed - Ca in normal range and within 180 days    Calcium  Date Value Ref Range Status  02/11/2024 9.7 8.7 - 10.2 mg/dL Final   Calcium, Total  Date Value Ref Range Status  10/31/2014 9.1 8.5 - 10.1 mg/dL Final         Passed - Cr in normal range and within 180 days    Creatinine  Date Value Ref Range Status  10/31/2014 0.69 0.60 - 1.30 mg/dL Final   Creatinine, Ser  Date Value Ref Range Status  02/11/2024 0.86 0.57 - 1.00 mg/dL Final         Passed - Valid encounter within last 6 months    Recent Outpatient Visits           1 month ago Primary hypertension   [facility] at [facility], Leita DEL, MD       Future Appointments             In 4 months Justus Leita DEL, MD [facility]  Primary Care & Sports Medicine at [facility], [facility]

## 2024-04-11 ENCOUNTER — Ambulatory Visit (INDEPENDENT_AMBULATORY_CARE_PROVIDER_SITE_OTHER): Admitting: Podiatry

## 2024-04-11 ENCOUNTER — Encounter: Payer: Self-pay | Admitting: Podiatry

## 2024-04-11 ENCOUNTER — Other Ambulatory Visit: Payer: Self-pay | Admitting: Student in an Organized Health Care Education/Training Program

## 2024-04-11 ENCOUNTER — Other Ambulatory Visit: Payer: Self-pay

## 2024-04-11 VITALS — Ht 67.0 in | Wt 218.0 lb

## 2024-04-11 DIAGNOSIS — Z9689 Presence of other specified functional implants: Secondary | ICD-10-CM

## 2024-04-11 DIAGNOSIS — M961 Postlaminectomy syndrome, not elsewhere classified: Secondary | ICD-10-CM

## 2024-04-11 DIAGNOSIS — L600 Ingrowing nail: Secondary | ICD-10-CM

## 2024-04-11 DIAGNOSIS — Z981 Arthrodesis status: Secondary | ICD-10-CM

## 2024-04-11 DIAGNOSIS — G8929 Other chronic pain: Secondary | ICD-10-CM

## 2024-04-11 NOTE — Progress Notes (Signed)
   Chief Complaint  Patient presents with   Ingrown Toenail    Pt is here due to right great toenail she had an ingrown removed on her last visit she states that it got infected and was but on antibiotics and has been ok since has no complaints.    Subjective: 58 y.o. female presents today status post permanent nail avulsion procedure of the medial border of the right great toe that was performed on 03/10/2024.  Doing well.  No new complaints.   Past Medical History:  Diagnosis Date   Acute respiratory failure with hypoxia (HCC) 12/31/2022   Allergy 1999   SEASONAL   Anemia    Anxiety    Arm pain, left 05/17/2018   Arthritis    lower spine   Bilateral lumbar radiculopathy    Cellulitis of abdominal wall 03/03/2021   Chronic back pain    Chronic GERD 03/03/2021   Clinical depression 04/04/2012   Depression    DVT (deep venous thrombosis) (HCC)    Encounter for screening colonoscopy    Essential (primary) hypertension 06/04/2020   Glaucoma    RIGHT EYE   Headache(784.0)    migraines   Major depressive disorder with single episode, in full remission (HCC) 08/15/2018   Malfunction of jejunostomy tube (HCC) 02/13/2021   Ovarian cyst    Pneumonia    walking pneumonia   Pneumonia of left lower lobe due to infectious organism 12/07/2022   Pulmonary embolism (HCC) 05/2017   Provoked, following knee surgery   S/P laparoscopic cholecystectomy 02/07/2021   Severe sepsis (HCC) 12/07/2022   Sleep apnea    no cpcap     last sleep study > 4 yrs   Vasomotor symptoms due to menopause 11/11/2016    Objective: Neurovascular status intact.  Skin is warm, dry and supple. Nail and respective nail fold appears to be healing appropriately.   Assessment: #1 s/p partial permanent nail matrixectomy medial border right great toe   Plan of care: #1 patient was evaluated  #2 light debridement of the periungual debris was performed to the border of the respective toe and nail plate using a  tissue nipper. #3 patient is to return to clinic on a PRN basis.   Thresa EMERSON Sar, DPM [facility]  Dr. Thresa EMERSON Sar, DPM    [address] [place], [place] [address]                Office [phone]  Fax [phone]

## 2024-04-12 NOTE — Progress Notes (Signed)
 Patient scheduled.

## 2024-04-13 ENCOUNTER — Other Ambulatory Visit: Payer: Self-pay

## 2024-04-14 NOTE — Progress Notes (Signed)
 Referring Physician:  Marcelino Nurse, MD [address] [place],  [place] [address]  Primary Physician:  Kimberly Leita DEL, MD  History of Present Illness: 04/19/2024 [name] is here today with a chief complaint of need for battery replacement.  She is currently managed by [facility] pain management.  Has a spinal cord stimulator with [employer].  She would like to have a battery exchange given need for excessive charging.  She was referred to us  for removal and replacement of her IPG.  Conservative measures:  Physical therapy:  has not participated in PT Multimodal medical therapy including regular antiinflammatories: Flexeril , Hydrocodone , Gabapentin   Injections: no epidural steroid injections  Past Surgery: 12/19/2021 Lumbar fusion L4-5 Surgeron: Kimberly Gunnels, MD SCS placed 03/20/2016 [employer]  The symptoms are causing a significant impact on the patient's life.   I have utilized the care everywhere function in epic to review the outside records available from external health systems.  Review of Systems:  A 10 point review of systems is negative, except for the pertinent positives and negatives detailed in the HPI.  Past Medical History: Past Medical History:  Diagnosis Date   Acute respiratory failure with hypoxia (HCC) 12/31/2022   Allergy 1999   SEASONAL   Anemia    Anxiety    Arm pain, left 05/17/2018   Arthritis    lower spine   Bilateral lumbar radiculopathy    Cellulitis of abdominal wall 03/03/2021   Chronic back pain    Chronic GERD 03/03/2021   Clinical depression 04/04/2012   Depression    DVT (deep venous thrombosis) (HCC)    Encounter for screening colonoscopy    Essential (primary) hypertension 06/04/2020   Glaucoma    RIGHT EYE   Headache(784.0)    migraines   Major depressive disorder with single episode, in full remission (HCC) 08/15/2018   Malfunction of jejunostomy tube (HCC) 02/13/2021   Ovarian cyst    Pneumonia     walking pneumonia   Pneumonia of left lower lobe due to infectious organism 12/07/2022   Pulmonary embolism (HCC) 05/2017   Provoked, following knee surgery   S/P laparoscopic cholecystectomy 02/07/2021   Severe sepsis (HCC) 12/07/2022   Sleep apnea    no cpcap     last sleep study > 4 yrs   Vasomotor symptoms due to menopause 11/11/2016    Past Surgical History: Past Surgical History:  Procedure Laterality Date   ABDOMINAL HYSTERECTOMY  10/2006   BACK SURGERY  2014   lumbar fusion   BACK SURGERY  2009   discectomy   BARIATRIC SURGERY  07/2020   Revision   CESAREAN SECTION  1989 1995   CHOLECYSTECTOMY  01/2021   COLONOSCOPY WITH PROPOFOL  N/A 04/12/2018   Procedure: COLONOSCOPY WITH PROPOFOL ;  Surgeon: Kimberly Corinn Skiff, MD;  Location: [facility] ENDOSCOPY;  Service: Gastroenterology;  Laterality: N/A;   GASTRIC BYPASS  2005   KNEE ARTHROSCOPY WITH LATERAL MENISECTOMY Left 05/27/2017   Procedure: KNEE ARTHROSCOPY WITH LATERAL MENISECTOMY;  Surgeon: Kimberly Sharper, MD;  Location: [facility] ORS;  Service: Orthopedics;  Laterality: Left;   KNEE ARTHROSCOPY WITH LATERAL RELEASE Left 05/27/2017   Procedure: KNEE ARTHROSCOPY WITH LATERAL RELEASE;  Surgeon: Kimberly Sharper, MD;  Location: [facility] ORS;  Service: Orthopedics;  Laterality: Left;   LAPAROSCOPIC SALPINGO OOPHERECTOMY Bilateral 10/05/2016   Procedure: LAPAROSCOPIC SALPINGO OOPHORECTOMY;  Surgeon: Gladis DELENA Dollar, MD;  Location: [facility] ORS;  Service: Gynecology;  Laterality: Bilateral;   REDUCTION MAMMAPLASTY Bilateral  2001   SPINAL CORD STIMULATOR INSERTION N/A 03/20/2016   Procedure: LUMBAR SPINAL CORD STIMULATOR INSERTION;  Surgeon: Kimberly GORMAN Molt, MD;  Location: [facility];  Service: Neurosurgery;  Laterality: N/A;   SPINE SURGERY  2009 2014 2017   TONSILLECTOMY      Allergies: Allergies as of 04/19/2024 - Review Complete 04/11/2024  Allergen Reaction Noted   Morphine  and codeine  Hives, Shortness Of Breath, Nausea And  Vomiting, and Other (See Comments) 10/28/2012   Penicillins Anaphylaxis, Hives, Nausea And Vomiting, Shortness Of Breath, and Rash 10/28/2012   Latex Hives 08/12/2015   Baclofen  Nausea Only 03/18/2016    Medications:  Current Outpatient Medications:    cyanocobalamin  (VITAMIN B12) 1000 MCG/ML injection, ADMINISTER 1 ML(1000 MCG) IN THE MUSCLE EVERY 30 DAYS, Disp: 1 mL, Rfl: 5   cyclobenzaprine  (FLEXERIL ) 10 MG tablet, Take 1 tablet (10 mg total) by mouth daily as needed for muscle spasms., Disp: 30 tablet, Rfl: 5   diphenoxylate -atropine  (LOMOTIL ) 2.5-0.025 MG tablet, Take 1 tablet by mouth 4 (four) times daily as needed., Disp: , Rfl:    fluticasone  (FLONASE ) 50 MCG/ACT nasal spray, Place 1 spray into both nostrils daily as needed for allergies., Disp: 16 g, Rfl: 1   furosemide  (LASIX ) 20 MG tablet, TAKE 1 TABLET BY MOUTH DAILY AS NEEDED, Disp: 90 tablet, Rfl: 0   gabapentin  (NEURONTIN ) 300 MG capsule, Take 2 capsules (600 mg total) by mouth at bedtime., Disp: 60 capsule, Rfl: 5   HYDROcodone -acetaminophen  (NORCO) 10-325 MG tablet, Take 1 tablet by mouth every 8 (eight) hours as needed., Disp: 90 tablet, Rfl: 0   latanoprost  (XALATAN ) 0.005 % ophthalmic solution, Place 1 drop into both eyes at bedtime. , Disp: , Rfl:    Multiple Vitamins-Minerals (MULTIVITAMIN GUMMIES ADULTS PO), Take 1 tablet by mouth daily., Disp: , Rfl:    pantoprazole  (PROTONIX ) 40 MG tablet, Take 40 mg by mouth 2 (two) times daily., Disp: , Rfl:    potassium chloride  (KLOR-CON ) 10 MEQ tablet, Take 1 tablet (10 mEq total) by mouth 2 (two) times daily., Disp: 180 tablet, Rfl: 1   rizatriptan  (MAXALT -MLT) 10 MG disintegrating tablet, DISSOLVE 1 TABLET BY MOUTH AS NEEDED FOR MIGRAINE. MAY REPEAT IN 2 HOURS AS NEEDED, Disp: 10 tablet, Rfl: 3   sertraline  (ZOLOFT ) 100 MG tablet, TAKE 1 TABLET(100 MG) BY MOUTH DAILY, Disp: 90 tablet, Rfl: 1   temazepam  (RESTORIL ) 15 MG capsule, TAKE 1 CAPSULE(15 MG) BY MOUTH AT BEDTIME AS NEEDED  FOR SLEEP, Disp: 30 capsule, Rfl: 2  Social History: Social History   Tobacco Use   Smoking status: Never   Smokeless tobacco: Never   Tobacco comments:    smoking cessation materials not required  Vaping Use   Vaping status: Never Used  Substance Use Topics   Alcohol use: No   Drug use: No    Family Medical History: Family History  Problem Relation Age of Onset   Diabetes Mother    Hypertension Mother    Obesity Mother    Arthritis Mother    Varicose Veins Mother    Diabetes Sister    Hypertension Sister    Arthritis Sister    Hypertension Father    Arthritis Father    Cancer Father    Obesity Father    Obesity Daughter    Obesity Sister    Arthritis Sister    Obesity Sister    Diabetes Maternal Grandmother    Cancer Maternal Aunt    Diabetes Maternal Aunt  Breast cancer Neg Hx     Physical Examination:   NEUROLOGICAL:     Awake, alert, oriented to person, place, and time.  Speech is clear and fluent.   Cranial Nerves: Pupils equal round and reactive to light.  Facial tone is symmetric.  Facial sensation is symmetric. Shoulder shrug is symmetric. Tongue protrusion is midline.    Strength: No major deficits noted  Gait is normal.    Imaging: No new imaging to review   Medical Decision Making/Assessment and Plan: 1. Battery end of life of spinal cord stimulator 2. Chronic radicular lumbar pain 3. Spinal cord stimulator status [employer], implanted 2017) 4. Failed back surgical syndrome 5. Chronic pain syndrome   Kimberly Cowan is a 58 year old woman with history of chronic radicular pain, failed back syndrome, chronic pain syndrome, spinal cord stimulator last implanted in 2017, here today to discuss battery exchange given need for excessive charging.  She has been under the care of [facility] pain, she has not had any major issues with her spinal cord stimulator.  She would like to have her battery exchanged for continued therapy.   We discussed risk and benefits of surgery including but not limited to bleeding, infection, damage to her existing system.  She understands the risks and wanted to go forward with surgery to remove and replace her IPG for the SCS system  Thank you for involving me in the care of this patient.    Penne MICAEL Sharps MD/MSCR Neurosurgery

## 2024-04-19 ENCOUNTER — Encounter: Payer: Self-pay | Admitting: Neurosurgery

## 2024-04-19 ENCOUNTER — Ambulatory Visit (INDEPENDENT_AMBULATORY_CARE_PROVIDER_SITE_OTHER): Admitting: Neurosurgery

## 2024-04-19 VITALS — BP 136/82 | Ht 67.0 in | Wt 218.0 lb

## 2024-04-19 DIAGNOSIS — Z9682 Presence of neurostimulator: Secondary | ICD-10-CM | POA: Diagnosis not present

## 2024-04-19 DIAGNOSIS — T85192A Other mechanical complication of implanted electronic neurostimulator (electrode) of spinal cord, initial encounter: Secondary | ICD-10-CM | POA: Diagnosis not present

## 2024-04-19 DIAGNOSIS — Z4542 Encounter for adjustment and management of neuropacemaker (brain) (peripheral nerve) (spinal cord): Secondary | ICD-10-CM | POA: Insufficient documentation

## 2024-04-19 DIAGNOSIS — M961 Postlaminectomy syndrome, not elsewhere classified: Secondary | ICD-10-CM

## 2024-04-19 DIAGNOSIS — G8929 Other chronic pain: Secondary | ICD-10-CM

## 2024-04-19 DIAGNOSIS — M5416 Radiculopathy, lumbar region: Secondary | ICD-10-CM | POA: Diagnosis not present

## 2024-04-19 DIAGNOSIS — G894 Chronic pain syndrome: Secondary | ICD-10-CM | POA: Diagnosis not present

## 2024-04-19 DIAGNOSIS — Z9689 Presence of other specified functional implants: Secondary | ICD-10-CM

## 2024-04-19 NOTE — Patient Instructions (Signed)
 Please see below for information in regards to your upcoming surgery:   Planned surgery: removal of replacement of internal pulse generator (IPG) - [employer]   Surgery date: 06/01/24 at [facility] (Medical Mall: [address] [place], [place] [address]) - you will find out your arrival time the business day before your surgery.   Pre-op appointment at [facility] Pre-admit Testing: you will receive a call with a date/time for this appointment. If you are scheduled for an in person appointment, Pre-admit Testing is located on the first floor of the [facility] building, [address] [facility] [address], [address]. During this appointment, they will advise you which medications you can take the morning of surgery, and which medications you will need to hold for surgery. Labs (such as blood work, EKG) may be done at your pre-op appointment. You are not required to fast for these labs. Should you need to change your pre-op appointment, please call Pre-admit testing at [phone].     How to contact us :  If you have any questions/concerns before or after surgery, you can reach us  at [phone], or you can send a mychart message. We can be reached by phone or mychart 8am-4pm, Monday-Friday.  *Please note: Calls after 4pm are forwarded to a third party answering service. Mychart messages are not routinely monitored during evenings, weekends, and holidays. Please call our office to contact the answering service for urgent concerns during non-business hours.    If you have FMLA/disability paperwork, please drop it off or fax it to [phone]   Appointments/FMLA & disability paperwork: [employer] Registered Nurse/Surgery scheduler: Chrisanne Loose, RN Certified Medical Assistants: Don, CMA, Elenor, CMA, & Damien, CMA Physician Assistants: Lyle Decamp, PA-C, Edsel Goods, PA-C & Glade Boys, PA-C Surgeons: Penne Sharps, MD & Reeves Daisy,  MD   [facility] PREADMIT TESTING VISIT and SURGERY INFORMATION SHEET   Now that surgery has been scheduled you can anticipate several phone calls from [facility] services. A pharmacy technician will call you to verify your current list of medications taken at home.               The Pre-Service Center will call to verify your insurance information and to give you billing estimates and information.             The Preadmit Testing Office will be calling to schedule a visit to obtain information for the anesthesia team and provide instructions on preparation for surgery.  What can you expect for the Preadmit Testing Visit: Appointments may be scheduled in-person or by telephone.  If a telephone visit is scheduled, you may be asked to come into the office to have lab tests or other studies performed.   This visit will not be completed any greater than 14 days prior to your surgery.  If your surgery has been scheduled for a future date, please do not be alarmed if we have not contacted you to schedule an appointment more than a month prior to the surgery date.    Please be prepared to provide the following information during this appointment:            -Personal medical history                                               -Medication and allergy list            -  Any history of problems with anesthesia              -Recent lab work or diagnostic studies            -Please notify us  of any needs we should be aware of to provide the best care possible           -You will be provided with instructions on how to prepare for your surgery.    On The Day of Surgery:  You must have a driver to take you home after surgery, you will be asked not to drive for 24 hours following surgery.  Taxi, Gisele and non-medical transport will not be acceptable means of transportation unless you have a responsible individual who will be traveling with you.  Visitors in the surgical area:   2  people will be able to visit you in your room once your preparation for surgery has been completed. During surgery, your visitors will be asked to wait in the Surgery Waiting Area.  It is not a requirement for them to stay, if they prefer to leave and come back.  Your visitor(s) will be given an update once the surgery has been completed.  No visitors are allowed in the initial recovery room to respect patient privacy and safety.  Once you are more awake and transfer to the secondary recovery area, or are transferred to an inpatient room, visitors will again be able to see you.  To respect and protect your privacy: We will ask on the day of surgery who your driver will be and what the contact number for that individual will be. We will ask if it is okay to share information with this individual, or if there is an alternative individual that we, or the surgeon, should contact to provide updates and information. If family or friends come to the surgical information desk requesting information about you, who you have not listed with us , no information will be given.   It may be helpful to designate someone as the main contact who will be responsible for updating your other friends and family.    PREADMIT TESTING OFFICE: [phone] SAME DAY SURGERY: [phone] We look forward to caring for you before and throughout the process of your surgery.

## 2024-04-24 ENCOUNTER — Other Ambulatory Visit: Payer: Self-pay

## 2024-04-24 ENCOUNTER — Other Ambulatory Visit: Payer: Self-pay | Admitting: Family Medicine

## 2024-04-24 DIAGNOSIS — Z01818 Encounter for other preprocedural examination: Secondary | ICD-10-CM

## 2024-04-27 ENCOUNTER — Encounter: Payer: Self-pay | Admitting: Internal Medicine

## 2024-05-12 DIAGNOSIS — R197 Diarrhea, unspecified: Secondary | ICD-10-CM | POA: Diagnosis not present

## 2024-05-12 DIAGNOSIS — G4733 Obstructive sleep apnea (adult) (pediatric): Secondary | ICD-10-CM | POA: Diagnosis not present

## 2024-05-12 DIAGNOSIS — K8681 Exocrine pancreatic insufficiency: Secondary | ICD-10-CM | POA: Diagnosis not present

## 2024-05-12 DIAGNOSIS — K909 Intestinal malabsorption, unspecified: Secondary | ICD-10-CM | POA: Diagnosis not present

## 2024-05-12 DIAGNOSIS — Z9884 Bariatric surgery status: Secondary | ICD-10-CM | POA: Diagnosis not present

## 2024-05-17 ENCOUNTER — Encounter: Payer: Self-pay | Admitting: Internal Medicine

## 2024-05-17 ENCOUNTER — Ambulatory Visit (INDEPENDENT_AMBULATORY_CARE_PROVIDER_SITE_OTHER): Admitting: Internal Medicine

## 2024-05-17 ENCOUNTER — Telehealth: Payer: Self-pay

## 2024-05-17 VITALS — BP 122/84 | HR 80 | Ht 67.0 in | Wt 213.0 lb

## 2024-05-17 DIAGNOSIS — Z6833 Body mass index (BMI) 33.0-33.9, adult: Secondary | ICD-10-CM

## 2024-05-17 DIAGNOSIS — E538 Deficiency of other specified B group vitamins: Secondary | ICD-10-CM

## 2024-05-17 DIAGNOSIS — R7303 Prediabetes: Secondary | ICD-10-CM | POA: Diagnosis not present

## 2024-05-17 DIAGNOSIS — F411 Generalized anxiety disorder: Secondary | ICD-10-CM

## 2024-05-17 MED ORDER — CYANOCOBALAMIN 1000 MCG/ML IJ SOLN: 1000.0000 ug | INTRAMUSCULAR | 5 refills | Status: DC

## 2024-05-17 MED ORDER — SEMAGLUTIDE-WEIGHT MANAGEMENT 0.25 MG/0.5ML ~~LOC~~ SOAJ: 0.2500 mg | SUBCUTANEOUS | 0 refills | Status: DC

## 2024-05-17 NOTE — Assessment & Plan Note (Signed)
 She has a weight goal of 180 lbs.  Her max weight before surgery was > 350 lbs. Will prescribe Wegovy  to see if it is covered.

## 2024-05-17 NOTE — Progress Notes (Signed)
 Date:  05/17/2024   Name:  Kimberly Cowan   DOB:  Sep 11, 1966   MRN:  [id number]   Chief Complaint: Weight Loss  Diabetes She presents for her follow-up diabetic visit. Diabetes type: prediabetes. Her disease course has been stable. Pertinent negatives for hypoglycemia include no dizziness, headaches or nervousness/anxiousness. Pertinent negatives for diabetes include no chest pain, no fatigue and no weakness. Symptoms are stable.  Anxiety Presents for follow-up visit. Patient reports no chest pain, dizziness, nervous/anxious behavior, palpitations or shortness of breath. Symptoms occur occasionally. The quality of sleep is good.   Compliance with medications is 76-100%.  B12 Def due to gastric bypass - on IM monthly injections but feels that it wears off before 30 days.  Will change Rx to every 21 days. Obesity - she has had gastric bypass then revision with good weight loss until recent plateau.  She wants to try Wegovy  if covered, she can not afford to pay out of pocket.  Review of Systems  Constitutional:  Negative for fatigue and unexpected weight change.  HENT:  Negative for trouble swallowing.   Eyes:  Negative for visual disturbance.  Respiratory:  Negative for cough, chest tightness, shortness of breath and wheezing.   Cardiovascular:  Negative for chest pain, palpitations and leg swelling.  Gastrointestinal:  Negative for abdominal pain, constipation and diarrhea.  Musculoskeletal:  Negative for arthralgias and myalgias.  Neurological:  Negative for dizziness, weakness, light-headedness and headaches.  Psychiatric/Behavioral:  Negative for dysphoric mood and sleep disturbance. The patient is not nervous/anxious.      Lab Results  Component Value Date   NA 145 (H) 02/11/2024   K 3.8 02/11/2024   CO2 24 02/11/2024   GLUCOSE 121 (H) 02/11/2024   BUN 10 02/11/2024   CREATININE 0.86 02/11/2024   CALCIUM 9.7 02/11/2024   EGFR 80 02/11/2024   GFRNONAA >60  12/10/2022   Lab Results  Component Value Date   CHOL 135 02/19/2022   HDL 65 02/19/2022   LDLCALC 55 02/19/2022   TRIG 74 02/19/2022   CHOLHDL 1.8 10/02/2021   Lab Results  Component Value Date   TSH 1.330 02/11/2024   Lab Results  Component Value Date   HGBA1C 5.8 04/24/2023   Lab Results  Component Value Date   WBC 4.1 02/11/2024   HGB 11.2 02/11/2024   HCT 35.4 02/11/2024   MCV 94 02/11/2024   PLT 191 02/11/2024   Lab Results  Component Value Date   ALT 54 (H) 02/11/2024   AST 43 (H) 02/11/2024   ALKPHOS 119 02/11/2024   BILITOT 0.4 02/11/2024   Lab Results  Component Value Date   VD25OH 9 02/19/2022     Patient Active Problem List   Diagnosis Date Noted   Battery end of life of spinal cord stimulator 04/19/2024   BMI 33.0-33.9,adult 12/07/2022   Primary hypertension 12/07/2022   Generalized anxiety disorder 10/05/2022   Pain management contract signed 05/07/2022   Spinal cord stimulator status [employer], implanted 2017) 04/28/2022   History of lumbar fusion (L5-S1 PSIF) 04/28/2022   Encounter for long-term opiate analgesic use 04/28/2022   Chronic pain syndrome 04/28/2022   High serum parathyroid hormone (PTH) 02/25/2022   Vitamin A  deficiency 02/25/2022   Vitamin D  deficiency 02/25/2022   Exocrine pancreatic insufficiency 02/19/2022   Degenerative spondylolisthesis 12/19/2021   Chronic GERD 03/03/2021   Status post hysterectomy 11/05/2020   Bariatric surgery status 01/01/2020   Primary insomnia 12/28/2019   Failed back surgical  syndrome 11/27/2019   Mild peripheral edema 11/15/2017   Perforated ear drum, left 06/25/2017   History of pulmonary embolism 06/10/2017   Environmental and seasonal allergies 04/07/2017   Tinea versicolor 02/03/2017   Glaucoma 09/28/2016   B12 nutritional deficiency 08/05/2016   OSA on CPAP 03/04/2016   Chronic radicular lumbar pain 08/14/2015   Prediabetes 07/01/2015   Spinal stenosis of lumbar region  12/10/2014   Headache, migraine 04/04/2012    Allergies  Allergen Reactions   Morphine  And Codeine  Hives, Shortness Of Breath, Nausea And Vomiting and Other (See Comments)    can't breathe   Penicillins Anaphylaxis, Hives, Nausea And Vomiting, Shortness Of Breath and Rash    can't breathe, Has patient had a PCN reaction causing immediate rash, facial/tongue/throat swelling, SOB or lightheadedness with hypotension: Yes  Has patient had a PCN reaction causing severe rash involving mucus membranes or skin necrosis: No  Has patient had a PCN reaction that required hospitalization No  Has patient had a PCN reaction occurring within the last 10 years: No  If all of the above answers are NO, then may proceed with Cephalosporin use.  Other Reaction(s): wheezing   Latex Hives   Baclofen  Nausea Only    Jittery, anxious    Past Surgical History:  Procedure Laterality Date   ABDOMINAL HYSTERECTOMY  10/2006   BACK SURGERY  2014   lumbar fusion   BACK SURGERY  2009   discectomy   BARIATRIC SURGERY  07/2020   Revision   CESAREAN SECTION  1989 1995   CHOLECYSTECTOMY  01/2021   COLONOSCOPY WITH PROPOFOL  N/A 04/12/2018   Procedure: COLONOSCOPY WITH PROPOFOL ;  Surgeon: Unk Corinn Skiff, MD;  Location: [facility];  Service: Gastroenterology;  Laterality: N/A;   GASTRIC BYPASS  2005   KNEE ARTHROSCOPY WITH LATERAL MENISECTOMY Left 05/27/2017   Procedure: KNEE ARTHROSCOPY WITH LATERAL MENISECTOMY;  Surgeon: Kathlynn Sharper, MD;  Location: [facility] ORS;  Service: Orthopedics;  Laterality: Left;   KNEE ARTHROSCOPY WITH LATERAL RELEASE Left 05/27/2017   Procedure: KNEE ARTHROSCOPY WITH LATERAL RELEASE;  Surgeon: Kathlynn Sharper, MD;  Location: [facility] ORS;  Service: Orthopedics;  Laterality: Left;   LAPAROSCOPIC SALPINGO OOPHERECTOMY Bilateral 10/05/2016   Procedure: LAPAROSCOPIC SALPINGO OOPHORECTOMY;  Surgeon: Gladis DELENA Dollar, MD;  Location: [facility] ORS;  Service: Gynecology;  Laterality:  Bilateral;   REDUCTION MAMMAPLASTY Bilateral    2001   SPINAL CORD STIMULATOR INSERTION N/A 03/20/2016   Procedure: LUMBAR SPINAL CORD STIMULATOR INSERTION;  Surgeon: Alm GORMAN Molt, MD;  Location: [facility];  Service: Neurosurgery;  Laterality: N/A;   SPINE SURGERY  2009 2014 2017   TONSILLECTOMY      Social History   Tobacco Use   Smoking status: Never   Smokeless tobacco: Never   Tobacco comments:    smoking cessation materials not required  Vaping Use   Vaping status: Every Day  Substance Use Topics   Alcohol use: No   Drug use: No     Medication list has been reviewed and updated.  Current Meds  Medication Sig   B Complex Vitamins (VITAMIN B-COMPLEX) TABS Take 1 tablet by mouth every morning.   cyclobenzaprine  (FLEXERIL ) 10 MG tablet Take 1 tablet (10 mg total) by mouth daily as needed for muscle spasms. (Patient taking differently: Take 10 mg by mouth at bedtime.)   diclofenac (VOLTAREN) 75 MG EC tablet Take 75 mg by mouth 2 (two) times daily.   diphenoxylate -atropine  (LOMOTIL ) 2.5-0.025 MG tablet Take 2 tablets by mouth in  the morning and at bedtime.   doxycycline  (VIBRA -TABS) 100 MG tablet Take 100 mg by mouth 2 (two) times daily.   fluticasone  (FLONASE ) 50 MCG/ACT nasal spray Place 1 spray into both nostrils daily as needed for allergies.   furosemide  (LASIX ) 20 MG tablet TAKE 1 TABLET BY MOUTH DAILY AS NEEDED   gabapentin  (NEURONTIN ) 300 MG capsule Take 2 capsules (600 mg total) by mouth at bedtime.   HYDROcodone -acetaminophen  (NORCO) 10-325 MG tablet Take 1 tablet by mouth at bedtime.   latanoprost  (XALATAN ) 0.005 % ophthalmic solution Place 1 drop into both eyes at bedtime.    neomycin -polymyxin-dexamethasone  (MAXITROL) 0.1 % ophthalmic suspension Apply 1 drop to eye 4 (four) times daily.   NON FORMULARY CPAP at night   pantoprazole  (PROTONIX ) 40 MG tablet Take 40 mg by mouth 2 (two) times daily.   potassium chloride  (KLOR-CON  M) 10 MEQ tablet Take 10 mEq by  mouth 2 (two) times daily.   rizatriptan  (MAXALT -MLT) 10 MG disintegrating tablet DISSOLVE 1 TABLET BY MOUTH AS NEEDED FOR MIGRAINE. MAY REPEAT IN 2 HOURS AS NEEDED   semaglutide -weight management (WEGOVY ) 0.25 MG/0.5ML SOAJ SQ injection Inject 0.25 mg into the skin once a week.   sertraline  (ZOLOFT ) 100 MG tablet TAKE 1 TABLET(100 MG) BY MOUTH DAILY (Patient taking differently: Take 100 mg by mouth at bedtime.)   temazepam  (RESTORIL ) 15 MG capsule TAKE 1 CAPSULE(15 MG) BY MOUTH AT BEDTIME AS NEEDED FOR SLEEP (Patient taking differently: Take 15 mg by mouth at bedtime.)   [DISCONTINUED] cyanocobalamin  (VITAMIN B12) 1000 MCG/ML injection ADMINISTER 1 ML(1000 MCG) IN THE MUSCLE EVERY 30 DAYS       05/17/2024    3:29 PM 02/11/2024    4:15 PM 11/06/2022    3:26 PM 10/05/2022    8:07 AM  GAD 7 : Generalized Anxiety Score  Nervous, Anxious, on Edge 0 0 2 2  Control/stop worrying 0 0 1 2  Worry too much - different things 0 0 2 2  Trouble relaxing 0 1 3 2   Restless 0 1 2 2   Easily annoyed or irritable 0 0 1 2  Afraid - awful might happen 0 1 2 3   Total GAD 7 Score 0 3 13 15   Anxiety Difficulty Not difficult at all Somewhat difficult Somewhat difficult Very difficult       05/17/2024    3:29 PM 02/11/2024    4:15 PM 01/04/2024    3:00 PM  Depression screen PHQ 2/9  Decreased Interest 0 0 0  Down, Depressed, Hopeless 0 0 0  PHQ - 2 Score 0 0 0  Altered sleeping 0 1   Tired, decreased energy 0 0   Change in appetite 0 0   Feeling bad or failure about yourself  0 0   Trouble concentrating 0 0   Moving slowly or fidgety/restless 0 0   Suicidal thoughts 0 0   PHQ-9 Score 0 1   Difficult doing work/chores Not difficult at all Not difficult at all     BP Readings from Last 3 Encounters:  05/17/24 122/84  04/19/24 136/82  03/28/24 (!) 157/84    Physical Exam Vitals and nursing note reviewed.  Constitutional:      General: She is not in acute distress.    Appearance: She is  well-developed. She is obese.  HENT:     Head: Normocephalic and atraumatic.  Cardiovascular:     Rate and Rhythm: Normal rate and regular rhythm.  Pulmonary:     Effort:  Pulmonary effort is normal. No respiratory distress.  Musculoskeletal:     Cervical back: Normal range of motion.     Right lower leg: No edema.     Left lower leg: No edema.  Skin:    General: Skin is warm and dry.     Findings: No rash.  Neurological:     Mental Status: She is alert and oriented to person, place, and time.  Psychiatric:        Mood and Affect: Mood normal.        Behavior: Behavior normal.     Wt Readings from Last 3 Encounters:  05/17/24 213 lb (96.6 kg)  04/19/24 218 lb (98.9 kg)  04/11/24 218 lb (98.9 kg)    BP 122/84   Pulse 80   Ht 5' 7 (1.702 m)   Wt 213 lb (96.6 kg)   SpO2 97%   BMI 33.36 kg/m   Assessment and Plan:  Problem List Items Addressed This Visit       Unprioritized   Prediabetes - Primary (Chronic)   Lab Results  Component Value Date   HGBA1C 5.8 04/24/2023  Continue dietary efforts.      B12 nutritional deficiency (Chronic)   Change instructions to inject every 21 days.      Generalized anxiety disorder (Chronic)   Clinically stable on Sertraline .   No SI or HI on evaluation. Plan to continue same medications for now.       BMI 33.0-33.9,adult   She has a weight goal of 180 lbs.  Her max weight before surgery was > 350 lbs. Will prescribe Wegovy  to see if it is covered.      Relevant Medications   semaglutide -weight management (WEGOVY ) 0.25 MG/0.5ML SOAJ SQ injection   Other Visit Diagnoses       B12 deficiency       Relevant Medications   cyanocobalamin  (VITAMIN B12) 1000 MCG/ML injection       No follow-ups on file.    Leita HILARIO Adie, MD [facility] [place]

## 2024-05-17 NOTE — Assessment & Plan Note (Signed)
 Change instructions to inject every 21 days.

## 2024-05-17 NOTE — Assessment & Plan Note (Signed)
 Lab Results  Component Value Date   HGBA1C 5.8 04/24/2023  Continue dietary efforts.

## 2024-05-17 NOTE — Assessment & Plan Note (Signed)
 Clinically stable on Sertraline.   No SI or HI on evaluation. Plan to continue same medications for now.

## 2024-05-18 ENCOUNTER — Telehealth: Payer: Self-pay | Admitting: Pharmacy Technician

## 2024-05-18 ENCOUNTER — Other Ambulatory Visit (HOSPITAL_COMMUNITY): Payer: Self-pay

## 2024-05-18 ENCOUNTER — Encounter: Payer: Self-pay | Admitting: Internal Medicine

## 2024-05-18 ENCOUNTER — Other Ambulatory Visit: Payer: Self-pay | Admitting: Internal Medicine

## 2024-05-18 ENCOUNTER — Telehealth: Payer: Self-pay

## 2024-05-18 DIAGNOSIS — Z6833 Body mass index (BMI) 33.0-33.9, adult: Secondary | ICD-10-CM

## 2024-05-18 NOTE — Progress Notes (Unsigned)
 Date:  05/18/2024   Name:  Kimberly Cowan   DOB:  12-11-1965   MRN:  [id number]   Chief Complaint: No chief complaint on file.  HPI  Review of Systems   Lab Results  Component Value Date   NA 145 (H) 02/11/2024   K 3.8 02/11/2024   CO2 24 02/11/2024   GLUCOSE 121 (H) 02/11/2024   BUN 10 02/11/2024   CREATININE 0.86 02/11/2024   CALCIUM 9.7 02/11/2024   EGFR 80 02/11/2024   GFRNONAA >60 12/10/2022   Lab Results  Component Value Date   CHOL 135 02/19/2022   HDL 65 02/19/2022   LDLCALC 55 02/19/2022   TRIG 74 02/19/2022   CHOLHDL 1.8 10/02/2021   Lab Results  Component Value Date   TSH 1.330 02/11/2024   Lab Results  Component Value Date   HGBA1C 5.8 04/24/2023   Lab Results  Component Value Date   WBC 4.1 02/11/2024   HGB 11.2 02/11/2024   HCT 35.4 02/11/2024   MCV 94 02/11/2024   PLT 191 02/11/2024   Lab Results  Component Value Date   ALT 54 (H) 02/11/2024   AST 43 (H) 02/11/2024   ALKPHOS 119 02/11/2024   BILITOT 0.4 02/11/2024   Lab Results  Component Value Date   VD25OH 9 02/19/2022     Patient Active Problem List   Diagnosis Date Noted   Battery end of life of spinal cord stimulator 04/19/2024   BMI 33.0-33.9,adult 12/07/2022   Primary hypertension 12/07/2022   Generalized anxiety disorder 10/05/2022   Pain management contract signed 05/07/2022   Spinal cord stimulator status [employer], implanted 2017) 04/28/2022   History of lumbar fusion (L5-S1 PSIF) 04/28/2022   Encounter for long-term opiate analgesic use 04/28/2022   Chronic pain syndrome 04/28/2022   High serum parathyroid hormone (PTH) 02/25/2022   Vitamin A  deficiency 02/25/2022   Vitamin D  deficiency 02/25/2022   Exocrine pancreatic insufficiency 02/19/2022   Degenerative spondylolisthesis 12/19/2021   Chronic GERD 03/03/2021   Status post hysterectomy 11/05/2020   Bariatric surgery status 01/01/2020   Primary insomnia 12/28/2019   Failed back  surgical syndrome 11/27/2019   Mild peripheral edema 11/15/2017   Perforated ear drum, left 06/25/2017   History of pulmonary embolism 06/10/2017   Environmental and seasonal allergies 04/07/2017   Tinea versicolor 02/03/2017   Glaucoma 09/28/2016   B12 nutritional deficiency 08/05/2016   OSA on CPAP 03/04/2016   Chronic radicular lumbar pain 08/14/2015   Prediabetes 07/01/2015   Spinal stenosis of lumbar region 12/10/2014   Headache, migraine 04/04/2012    Allergies  Allergen Reactions   Morphine  And Codeine  Hives, Shortness Of Breath, Nausea And Vomiting and Other (See Comments)    can't breathe   Penicillins Anaphylaxis, Hives, Nausea And Vomiting, Shortness Of Breath and Rash    can't breathe, Has patient had a PCN reaction causing immediate rash, facial/tongue/throat swelling, SOB or lightheadedness with hypotension: Yes  Has patient had a PCN reaction causing severe rash involving mucus membranes or skin necrosis: No  Has patient had a PCN reaction that required hospitalization No  Has patient had a PCN reaction occurring within the last 10 years: No  If all of the above answers are NO, then may proceed with Cephalosporin use.  Other Reaction(s): wheezing   Latex Hives   Baclofen  Nausea Only    Jittery, anxious    Past Surgical History:  Procedure Laterality Date   ABDOMINAL HYSTERECTOMY  10/2006   BACK SURGERY  2014   lumbar fusion   BACK SURGERY  2009   discectomy   BARIATRIC SURGERY  07/2020   Revision   CESAREAN SECTION  1989 1995   CHOLECYSTECTOMY  01/2021   COLONOSCOPY WITH PROPOFOL  N/A 04/12/2018   Procedure: COLONOSCOPY WITH PROPOFOL ;  Surgeon: Unk Corinn Skiff, MD;  Location: [facility] ENDOSCOPY;  Service: Gastroenterology;  Laterality: N/A;   GASTRIC BYPASS  2005   KNEE ARTHROSCOPY WITH LATERAL MENISECTOMY Left 05/27/2017   Procedure: KNEE ARTHROSCOPY WITH LATERAL MENISECTOMY;  Surgeon: Kathlynn Sharper, MD;  Location: [facility] ORS;  Service:  Orthopedics;  Laterality: Left;   KNEE ARTHROSCOPY WITH LATERAL RELEASE Left 05/27/2017   Procedure: KNEE ARTHROSCOPY WITH LATERAL RELEASE;  Surgeon: Kathlynn Sharper, MD;  Location: [facility] ORS;  Service: Orthopedics;  Laterality: Left;   LAPAROSCOPIC SALPINGO OOPHERECTOMY Bilateral 10/05/2016   Procedure: LAPAROSCOPIC SALPINGO OOPHORECTOMY;  Surgeon: Gladis DELENA Dollar, MD;  Location: [facility] ORS;  Service: Gynecology;  Laterality: Bilateral;   REDUCTION MAMMAPLASTY Bilateral    2001   SPINAL CORD STIMULATOR INSERTION N/A 03/20/2016   Procedure: LUMBAR SPINAL CORD STIMULATOR INSERTION;  Surgeon: Alm GORMAN Molt, MD;  Location: [facility];  Service: Neurosurgery;  Laterality: N/A;   SPINE SURGERY  2009 2014 2017   TONSILLECTOMY      Social History   Tobacco Use   Smoking status: Never   Smokeless tobacco: Never   Tobacco comments:    smoking cessation materials not required  Vaping Use   Vaping status: Every Day  Substance Use Topics   Alcohol use: No   Drug use: No     Medication list has been reviewed and updated.  No outpatient medications have been marked as taking for the 05/18/24 encounter (Orders Only) with Justus Leita DEL, MD.       05/17/2024    3:29 PM 02/11/2024    4:15 PM 11/06/2022    3:26 PM 10/05/2022    8:07 AM  GAD 7 : Generalized Anxiety Score  Nervous, Anxious, on Edge 0 0 2 2  Control/stop worrying 0 0 1 2  Worry too much - different things 0 0 2 2  Trouble relaxing 0 1 3 2   Restless 0 1 2 2   Easily annoyed or irritable 0 0 1 2  Afraid - awful might happen 0 1 2 3   Total GAD 7 Score 0 3 13 15   Anxiety Difficulty Not difficult at all Somewhat difficult Somewhat difficult Very difficult       05/17/2024    3:29 PM 02/11/2024    4:15 PM 01/04/2024    3:00 PM  Depression screen PHQ 2/9  Decreased Interest 0 0 0  Down, Depressed, Hopeless 0 0 0  PHQ - 2 Score 0 0 0  Altered sleeping 0 1   Tired, decreased energy 0 0   Change in appetite 0 0   Feeling bad  or failure about yourself  0 0   Trouble concentrating 0 0   Moving slowly or fidgety/restless 0 0   Suicidal thoughts 0 0   PHQ-9 Score 0 1   Difficult doing work/chores Not difficult at all Not difficult at all     BP Readings from Last 3 Encounters:  05/17/24 122/84  04/19/24 136/82  03/28/24 (!) 157/84    Physical Exam  Wt Readings from Last 3 Encounters:  05/17/24 213 lb (96.6 kg)  04/19/24 218 lb (98.9 kg)  04/11/24 218 lb (98.9 kg)    There were no vitals taken  for this visit.  Assessment and Plan:  Problem List Items Addressed This Visit   None   No follow-ups on file.    Leita HILARIO Adie, MD [facility] [place]

## 2024-05-18 NOTE — Telephone Encounter (Signed)
 PA request has been Submitted. New Encounter has been or will be created for follow up. For additional info see Pharmacy Prior Auth telephone encounter from 05/18/24.

## 2024-05-18 NOTE — Telephone Encounter (Signed)
 Copied from CRM [id number]. Topic: Clinical - Medication Prior Auth >> May 18, 2024 11:47 AM Amy B wrote: Reason for CRM: Please call [employer] at [phone] to answer clinical questions for prior authorization, reference patient's name.

## 2024-05-18 NOTE — Telephone Encounter (Signed)
 PA has been done by the PA team. Awaiting results. Pt informed.

## 2024-05-18 NOTE — Telephone Encounter (Signed)
 Pharmacy Patient Advocate Encounter   Received notification from Pt Calls Messages that prior authorization for WEGOVY  0.25MG  is required/requested.   Insurance verification completed.   The patient is insured through [facility] .   Per test claim: PA required; PA submitted to above mentioned insurance via Phone Key/confirmation #/EOC [id number] Status is pending

## 2024-05-19 ENCOUNTER — Other Ambulatory Visit (HOSPITAL_COMMUNITY): Payer: Self-pay

## 2024-05-19 ENCOUNTER — Telehealth: Payer: Self-pay

## 2024-05-19 ENCOUNTER — Other Ambulatory Visit: Payer: Self-pay

## 2024-05-19 ENCOUNTER — Encounter
Admission: RE | Admit: 2024-05-19 | Discharge: 2024-05-19 | Disposition: A | Discharge status: Home / Self Care | Source: Ambulatory Visit | Attending: Neurosurgery | Admitting: Neurosurgery

## 2024-05-19 VITALS — BP 147/84 | HR 79 | Temp 98.6°F | Resp 18 | Ht 67.0 in | Wt 215.0 lb

## 2024-05-19 DIAGNOSIS — I10 Essential (primary) hypertension: Secondary | ICD-10-CM | POA: Diagnosis not present

## 2024-05-19 DIAGNOSIS — Z86711 Personal history of pulmonary embolism: Secondary | ICD-10-CM | POA: Insufficient documentation

## 2024-05-19 DIAGNOSIS — Z01812 Encounter for preprocedural laboratory examination: Secondary | ICD-10-CM

## 2024-05-19 DIAGNOSIS — Z01818 Encounter for other preprocedural examination: Secondary | ICD-10-CM | POA: Diagnosis not present

## 2024-05-19 DIAGNOSIS — Z01810 Encounter for preprocedural cardiovascular examination: Secondary | ICD-10-CM

## 2024-05-19 LAB — SURGICAL PCR SCREEN
MRSA, PCR: NEGATIVE
Staphylococcus aureus: NEGATIVE

## 2024-05-19 LAB — BASIC METABOLIC PANEL WITH GFR
Anion gap: 9 (ref 5–15)
BUN: 11 mg/dL (ref 6–20)
CO2: 26 mmol/L (ref 22–32)
Calcium: 9.2 mg/dL (ref 8.9–10.3)
Chloride: 107 mmol/L (ref 98–111)
Creatinine, Ser: 0.74 mg/dL (ref 0.44–1.00)
GFR, Estimated: >60 mL/min (ref >60)
Glucose, Bld: 87 mg/dL (ref 70–99)
Potassium: 3.7 mmol/L (ref 3.5–5.1)
Sodium: 142 mmol/L (ref 135–145)

## 2024-05-19 LAB — CBC
HCT: 35.9 % — ABNORMAL LOW (ref 36.0–46.0)
Hemoglobin: 10.9 g/dL — ABNORMAL LOW (ref 12.0–15.0)
MCH: 28.8 pg (ref 26.0–34.0)
MCHC: 30.4 g/dL (ref 30.0–36.0)
MCV: 94.7 fL (ref 80.0–100.0)
Platelets: 193 K/uL (ref 150–400)
RBC: 3.79 MIL/uL — ABNORMAL LOW (ref 3.87–5.11)
RDW: 14.5 % (ref 11.5–15.5)
WBC: 3.6 K/uL — ABNORMAL LOW (ref 4.0–10.5)
nRBC: 0 % (ref 0.0–0.2)

## 2024-05-19 NOTE — Telephone Encounter (Signed)
 Okay thank you.  JM

## 2024-05-19 NOTE — Telephone Encounter (Signed)
 Good morning Kimberly Cowan, thanks, I just got off the phone with [facility] @ [employer] and answered more clinical questions and re faxed her chart notes.

## 2024-05-19 NOTE — Telephone Encounter (Signed)
 Copied from CRM #[id number]. Topic: Clinical - Medication Prior Auth >> May 18, 2024  5:00 PM Turkey B wrote: Reason for CRM: Laquita from [place] called in says has clinical question for PA for Wegovy  0.25mg  . Ref # is PA  [id number]. Caller says needs this info by 08/25 at 7:59 cst, or will auto deny

## 2024-05-19 NOTE — Patient Instructions (Addendum)
 Your procedure is scheduled on:   THURSDAY   SEPTEMBER 4  Report to the Registration Desk on the 1st floor of the [facility] To find out your arrival time, please call [phone] between 1PM - 3PM on: [facility]   SEPTEMBER  3 If your arrival time is 6:00 am, do not arrive before that time as the [facility] entrance doors do not open until 6:00 am.  REMEMBER: Instructions that are not followed completely may result in serious medical risk, up to and including death; or upon the discretion of your surgeon and anesthesiologist your surgery may need to be rescheduled.  Do not eat food after midnight the night before surgery.  No gum chewing or hard candies.  You may however, drink CLEAR liquids up to 2 hours before you are scheduled to arrive for your surgery. Do not drink anything within 2 hours of your scheduled arrival time.  Clear liquids include: - water  - apple juice without pulp - gatorade (not RED colors) - black coffee or tea (Do NOT add milk or creamers to the coffee or tea) Do NOT drink anything that is not on this list.  One week prior to surgery:   THURSDAY  AUGUST 28  Stop Anti-inflammatories (NSAIDS) such as Advil , Aleve , Ibuprofen , Motrin , Naproxen , Naprosyn  and Aspirin based products such as Excedrin, Goody's Powder, BC Powder. Stop ANY OVER THE COUNTER supplements until after surgery. B Complex Vitamins  cyanocobalamin  (VITAMIN B12)   fluticasone  (FLONASE )  potassium chloride  (KLOR-CON  M)   You may however, continue to take Tylenol  if needed for pain up until the day of surgery. diclofenac (VOLTAREN) hold 7 days, last dose [facility] AUGUST 27    Continue taking all of your other prescription medications up until the day of surgery.  ON THE DAY OF SURGERY DO NOT TAKE ANY MEDICATIONS  No Alcohol for 24 hours before or after surgery.  No Smoking including e-cigarettes for 24 hours before surgery.   Do not use any recreational drugs for at least a week  (preferably 2 weeks) before your surgery.  Please be advised that the combination of cocaine and anesthesia may have negative outcomes, up to and including death. If you test positive for cocaine, your surgery will be cancelled.  On the morning of surgery brush your teeth with toothpaste and water, you may rinse your mouth with mouthwash if you wish. Do not swallow any toothpaste or mouthwash.  Use CHG Soap as directed on instruction sheet.  Do not wear jewelry, make-up, hairpins, clips or nail polish.  For welded (permanent) jewelry: bracelets, anklets, waist bands, etc.  Please have this removed prior to surgery.  If it is not removed, there is a chance that hospital personnel will need to cut it off on the day of surgery.  Do not wear lotions, powders, or perfumes.   Do not shave body hair from the neck down 48 hours before surgery.  Contact lenses, hearing aids and dentures may not be worn into surgery.  Do not bring valuables to the hospital. [facility] is not responsible for any missing/lost belongings or valuables.   Notify your doctor if there is any change in your medical condition (cold, fever, infection).  Wear comfortable clothing (specific to your surgery type) to the hospital.  After surgery, you can help prevent lung complications by doing breathing exercises.  Take deep breaths and cough every 1-2 hours. Your doctor may order a device called an Incentive Spirometer to help you take  deep breaths. When coughing or sneezing, hold a pillow firmly against your incision with both hands. This is called "splinting." Doing this helps protect your incision. It also decreases belly discomfort.  If you are being discharged the day of surgery, you will not be allowed to drive home. You will need a responsible individual to drive you home and stay with you for 24 hours after surgery.   If you are taking public transportation, you will need to have a responsible individual with  you.  Please call the Pre-admissions Testing Dept. at [phone] if you have any questions about these instructions.  Surgery Visitation Policy:  Patients having surgery or a procedure may have two visitors.  Children under the age of 32 must have an adult with them who is not the patient.   Merchandiser, retail to address health-related social needs:  [email or web address]      Pre-operative 5 CHG Bath Instructions   You can play a key role in reducing the risk of infection after surgery. Your skin needs to be as free of germs as possible. You can reduce the number of germs on your skin by washing with CHG (chlorhexidine  gluconate) soap before surgery. CHG is an antiseptic soap that kills germs and continues to kill germs even after washing.   DO NOT use if you have an allergy to chlorhexidine /CHG or antibacterial soaps. If your skin becomes reddened or irritated, stop using the CHG and notify one of our RNs at [phone].   Please shower with the CHG soap starting 4 days before surgery using the following schedule:   SUNDAY AUGUST 31    Please keep in mind the following:  DO NOT shave, including legs and underarms, starting the day of your first shower.   You may shave your face at any point before/day of surgery.  Place clean sheets on your bed the day you start using CHG soap. Use a clean washcloth (not used since being washed) for each shower. DO NOT sleep with pets once you start using the CHG.   CHG Shower Instructions:  If you choose to wash your hair and private area, wash first with your normal shampoo/soap.  After you use shampoo/soap, rinse your hair and body thoroughly to remove shampoo/soap residue.  Turn the water OFF and apply about 3 tablespoons (45 ml) of CHG soap to a CLEAN washcloth.  Apply CHG soap ONLY FROM YOUR NECK DOWN TO YOUR TOES (washing for 3-5 minutes)  DO NOT use CHG soap on face, private areas, open wounds, or sores.   Pay special attention to the area where your surgery is being performed.  If you are having back surgery, having someone wash your back for you may be helpful. Wait 2 minutes after CHG soap is applied, then you may rinse off the CHG soap.  Pat dry with a clean towel  Put on clean clothes/pajamas   If you choose to wear lotion, please use ONLY the CHG-compatible lotions on the back of this paper.     Additional instructions for the day of surgery: DO NOT APPLY any lotions, deodorants, cologne, or perfumes.   Put on clean/comfortable clothes.  Brush your teeth.  Ask your nurse before applying any prescription medications to the skin.      CHG Compatible Lotions   Aveeno Moisturizing lotion  Cetaphil Moisturizing Cream  Cetaphil Moisturizing Lotion  Clairol Herbal Essence Moisturizing Lotion, Dry Skin  Clairol Herbal Essence Moisturizing Lotion, Extra Dry Skin  Clairol Herbal  Essence Moisturizing Lotion, Normal Skin  Curel Age Defying Therapeutic Moisturizing Lotion with Alpha Hydroxy  Curel Extreme Care Body Lotion  Curel Soothing Hands Moisturizing Hand Lotion  Curel Therapeutic Moisturizing Cream, Fragrance-Free  Curel Therapeutic Moisturizing Lotion, Fragrance-Free  Curel Therapeutic Moisturizing Lotion, Original Formula  Eucerin Daily Replenishing Lotion  Eucerin Dry Skin Therapy Plus Alpha Hydroxy Crme  Eucerin Dry Skin Therapy Plus Alpha Hydroxy Lotion  Eucerin Original Crme  Eucerin Original Lotion  Eucerin Plus Crme Eucerin Plus Lotion  Eucerin TriLipid Replenishing Lotion  Keri Anti-Bacterial Hand Lotion  Keri Deep Conditioning Original Lotion Dry Skin Formula Softly Scented  Keri Deep Conditioning Original Lotion, Fragrance Free Sensitive Skin Formula  Keri Lotion Fast Absorbing Fragrance Free Sensitive Skin Formula  Keri Lotion Fast Absorbing Softly Scented Dry Skin Formula  Keri Original Lotion  Keri Skin Renewal Lotion Keri Silky Smooth Lotion  Keri Silky  Smooth Sensitive Skin Lotion  Nivea Body Creamy Conditioning Oil  Nivea Body Extra Enriched Teacher, adult education Moisturizing Lotion Nivea Crme  Nivea Skin Firming Lotion  NutraDerm 30 Skin Lotion  NutraDerm Skin Lotion  NutraDerm Therapeutic Skin Cream  NutraDerm Therapeutic Skin Lotion  ProShield Protective Hand Cream  Provon moisturizing lotion

## 2024-05-23 ENCOUNTER — Telehealth: Payer: Self-pay | Admitting: Pharmacy Technician

## 2024-05-30 ENCOUNTER — Other Ambulatory Visit: Payer: Self-pay | Admitting: Internal Medicine

## 2024-05-30 ENCOUNTER — Other Ambulatory Visit (HOSPITAL_COMMUNITY): Payer: Self-pay

## 2024-05-30 DIAGNOSIS — F5101 Primary insomnia: Secondary | ICD-10-CM

## 2024-05-31 ENCOUNTER — Other Ambulatory Visit (HOSPITAL_COMMUNITY): Payer: Self-pay

## 2024-05-31 ENCOUNTER — Encounter: Payer: Self-pay | Admitting: Internal Medicine

## 2024-05-31 NOTE — Telephone Encounter (Signed)
 Requested medication (s) are due for refill today: yes  Requested medication (s) are on the active medication list: yes  Last refill:  02/10/24 #30 2 RF  Future visit scheduled: yes  Notes to clinic:  med not delegated to NT to RF   Requested Prescriptions  Pending Prescriptions Disp Refills   temazepam  (RESTORIL ) 15 MG capsule [Pharmacy Med Name: TEMAZEPAM  15MG  CAPSULES] 30 capsule     Sig: TAKE 1 CAPSULE(15 MG) BY MOUTH AT BEDTIME AS NEEDED FOR SLEEP     Not Delegated - Psychiatry: Anxiolytics/Hypnotics 2 Failed - 05/31/2024  1:49 PM      Failed - This refill cannot be delegated      Failed - Urine Drug Screen completed in last 360 days      Passed - Patient is not pregnant      Passed - Valid encounter within last 6 months    Recent Outpatient Visits           2 weeks ago Prediabetes   [place] [facility] at [facility], Leita DEL, MD   3 months ago Primary hypertension   [place] [facility] at [facility], Leita DEL, MD       Future Appointments             In 2 months Kimberly Leita DEL, MD [facility] at [facility], [phone] [place]            Signed Prescriptions Disp Refills   fluticasone  (FLONASE ) 50 MCG/ACT nasal spray 16 g 1    Sig: PLACE 1 SPRAY IN EACH NOSTRIL DAILY AS NEEDED FOR ALLERGIES     Ear, Nose, and Throat: Nasal Preparations - Corticosteroids Passed - 05/31/2024  1:49 PM      Passed - Valid encounter within last 12 months    Recent Outpatient Visits           2 weeks ago Prediabetes   [facility] at [facility], Leita DEL, MD   3 months ago Primary hypertension   [place] [facility] at [facility], Leita DEL, MD       Future Appointments             In 2 months Kimberly, Leita DEL, MD [facility] at [facility], [phone] [place]

## 2024-05-31 NOTE — Telephone Encounter (Signed)
 DUPLICATE

## 2024-05-31 NOTE — Telephone Encounter (Signed)
 Requested Prescriptions  Pending Prescriptions Disp Refills   temazepam  (RESTORIL ) 15 MG capsule [Pharmacy Med Name: TEMAZEPAM  15MG  CAPSULES] 30 capsule     Sig: TAKE 1 CAPSULE(15 MG) BY MOUTH AT BEDTIME AS NEEDED FOR SLEEP     Not Delegated - Psychiatry: Anxiolytics/Hypnotics 2 Failed - 05/31/2024  1:48 PM      Failed - This refill cannot be delegated      Failed - Urine Drug Screen completed in last 360 days      Passed - Patient is not pregnant      Passed - Valid encounter within last 6 months    Recent Outpatient Visits           2 weeks ago Prediabetes   [place] [facility] at [facility], Leita DEL, MD   3 months ago Primary hypertension   [place] [facility] at [facility], Leita DEL, MD       Future Appointments             In 2 months Justus Leita DEL, MD [facility] at [facility], [phone] [place]             fluticasone  (FLONASE ) 50 MCG/ACT nasal spray [Pharmacy Med Name: FLUTICASONE  NASAL SP (120) RX] 16 g 1    Sig: PLACE 1 SPRAY IN EACH NOSTRIL DAILY AS NEEDED FOR ALLERGIES     Ear, Nose, and Throat: Nasal Preparations - Corticosteroids Passed - 05/31/2024  1:48 PM      Passed - Valid encounter within last 12 months    Recent Outpatient Visits           2 weeks ago Prediabetes   [facility] at [facility], Leita DEL, MD   3 months ago Primary hypertension   [place] [facility] at [facility], Leita DEL, MD       Future Appointments             In 2 months Justus, Leita DEL, MD [facility] at [facility], [phone] [place]

## 2024-05-31 NOTE — Telephone Encounter (Signed)
 Please review.  KP

## 2024-05-31 NOTE — Telephone Encounter (Signed)
 Pharmacy Patient Advocate Encounter  Received notification from [employer] that Prior Authorization for Wegovy  0.25 mg/0.77ml  has been DENIED.  Full denial letter will be uploaded to the media tab. See denial reason below.   Patient has to have been in a weight loss program 6 months before Wegovy  was prescribed and also have the patient tried Contrave or Qsymia  PA #/Case ID/Reference #: [id number]

## 2024-06-01 ENCOUNTER — Encounter: Payer: Self-pay | Admitting: Neurosurgery

## 2024-06-01 ENCOUNTER — Other Ambulatory Visit: Payer: Self-pay

## 2024-06-01 ENCOUNTER — Encounter
Admission: RE | Disposition: A | Discharge status: Home / Self Care | Payer: Self-pay | Source: Home / Self Care | Attending: Neurosurgery

## 2024-06-01 ENCOUNTER — Ambulatory Visit

## 2024-06-01 ENCOUNTER — Ambulatory Visit: Payer: Self-pay | Admitting: Urgent Care

## 2024-06-01 ENCOUNTER — Ambulatory Visit
Admission: RE | Admit: 2024-06-01 | Discharge: 2024-06-01 | Disposition: A | Discharge status: Home / Self Care | Attending: Neurosurgery | Admitting: Neurosurgery

## 2024-06-01 DIAGNOSIS — K219 Gastro-esophageal reflux disease without esophagitis: Secondary | ICD-10-CM | POA: Insufficient documentation

## 2024-06-01 DIAGNOSIS — Z4542 Encounter for adjustment and management of neuropacemaker (brain) (peripheral nerve) (spinal cord): Secondary | ICD-10-CM | POA: Diagnosis not present

## 2024-06-01 DIAGNOSIS — Z01818 Encounter for other preprocedural examination: Secondary | ICD-10-CM

## 2024-06-01 DIAGNOSIS — G894 Chronic pain syndrome: Secondary | ICD-10-CM

## 2024-06-01 DIAGNOSIS — Z9689 Presence of other specified functional implants: Secondary | ICD-10-CM

## 2024-06-01 DIAGNOSIS — G709 Myoneural disorder, unspecified: Secondary | ICD-10-CM | POA: Diagnosis not present

## 2024-06-01 DIAGNOSIS — G4730 Sleep apnea, unspecified: Secondary | ICD-10-CM | POA: Insufficient documentation

## 2024-06-01 DIAGNOSIS — Z780 Asymptomatic menopausal state: Secondary | ICD-10-CM | POA: Insufficient documentation

## 2024-06-01 HISTORY — PX: SPINAL CORD STIMULATOR BATTERY EXCHANGE: SHX6202

## 2024-06-01 SURGERY — SPINAL CORD STIMULATOR BATTERY EXCHANGE
Anesthesia: General | Site: Back | Laterality: Left

## 2024-06-01 MED ORDER — EPHEDRINE 5 MG/ML INJ
INTRAVENOUS | Status: AC
  Filled 2024-06-01: qty 5

## 2024-06-01 MED ORDER — DOCUSATE SODIUM 100 MG PO CAPS
100.0000 mg | ORAL_CAPSULE | Freq: Every day | ORAL | 0 refills | Status: AC | PRN
  Filled 2024-06-01: qty 5, 5d supply, fill #0

## 2024-06-01 MED ORDER — OXYCODONE HCL 5 MG PO TABS
5.0000 mg | ORAL_TABLET | Freq: Once | ORAL | Status: AC | PRN
  Administered 2024-06-01: 5 mg via ORAL

## 2024-06-01 MED ORDER — HYDROMORPHONE HCL 1 MG/ML IJ SOLN
INTRAMUSCULAR | Status: AC
  Filled 2024-06-01: qty 1

## 2024-06-01 MED ORDER — DEXAMETHASONE SODIUM PHOSPHATE 10 MG/ML IJ SOLN
INTRAMUSCULAR | Status: AC
  Filled 2024-06-01: qty 1

## 2024-06-01 MED ORDER — PROPOFOL 10 MG/ML IV BOLUS
INTRAVENOUS | Status: DC | PRN
  Administered 2024-06-01: 150 mg via INTRAVENOUS

## 2024-06-01 MED ORDER — LIDOCAINE HCL (CARDIAC) PF 100 MG/5ML IV SOSY
PREFILLED_SYRINGE | INTRAVENOUS | Status: DC | PRN
  Administered 2024-06-01: 80 mg via INTRAVENOUS

## 2024-06-01 MED ORDER — LACTATED RINGERS IV SOLN: INTRAVENOUS | Status: DC

## 2024-06-01 MED ORDER — BUPIVACAINE HCL (PF) 0.5 % IJ SOLN
INTRAMUSCULAR | Status: AC
  Filled 2024-06-01: qty 30

## 2024-06-01 MED ORDER — FENTANYL CITRATE (PF) 100 MCG/2ML IJ SOLN
INTRAMUSCULAR | Status: AC
Start: 2024-06-01 — End: 2024-06-01
  Filled 2024-06-01: qty 2

## 2024-06-01 MED ORDER — CEFAZOLIN SODIUM-DEXTROSE 2-4 GM/100ML-% IV SOLN: 2.0000 g | Freq: Once | INTRAVENOUS | Status: DC

## 2024-06-01 MED ORDER — PROPOFOL 10 MG/ML IV BOLUS
INTRAVENOUS | Status: AC
  Filled 2024-06-01: qty 20

## 2024-06-01 MED ORDER — MIDAZOLAM HCL 2 MG/2ML IJ SOLN
INTRAMUSCULAR | Status: DC | PRN
  Administered 2024-06-01: 2 mg via INTRAVENOUS

## 2024-06-01 MED ORDER — HYDROMORPHONE HCL 1 MG/ML IJ SOLN
INTRAMUSCULAR | Status: DC | PRN
  Administered 2024-06-01: .5 mg via INTRAVENOUS

## 2024-06-01 MED ORDER — ONDANSETRON HCL 4 MG/2ML IJ SOLN
INTRAMUSCULAR | Status: AC
  Filled 2024-06-01: qty 2

## 2024-06-01 MED ORDER — OXYCODONE HCL 5 MG PO TABS
5.0000 mg | ORAL_TABLET | Freq: Four times a day (QID) | ORAL | 0 refills | Status: AC | PRN
  Filled 2024-06-01: qty 20, 5d supply, fill #0

## 2024-06-01 MED ORDER — PHENYLEPHRINE HCL-NACL 20-0.9 MG/250ML-% IV SOLN
INTRAVENOUS | Status: AC
  Filled 2024-06-01: qty 250

## 2024-06-01 MED ORDER — ACETAMINOPHEN 10 MG/ML IV SOLN
INTRAVENOUS | Status: AC
  Filled 2024-06-01: qty 100

## 2024-06-01 MED ORDER — SUCCINYLCHOLINE CHLORIDE 200 MG/10ML IV SOSY
PREFILLED_SYRINGE | INTRAVENOUS | Status: AC
  Filled 2024-06-01: qty 10

## 2024-06-01 MED ORDER — SODIUM CHLORIDE (PF) 0.9 % IJ SOLN
INTRAMUSCULAR | Status: AC
  Filled 2024-06-01: qty 20

## 2024-06-01 MED ORDER — VANCOMYCIN HCL 1000 MG/200ML IV SOLN
1000.0000 mg | Freq: Once | INTRAVENOUS | Status: AC
  Administered 2024-06-01: 1000 mg via INTRAVENOUS
  Filled 2024-06-01: qty 200

## 2024-06-01 MED ORDER — FENTANYL CITRATE (PF) 100 MCG/2ML IJ SOLN
INTRAMUSCULAR | Status: DC | PRN
  Administered 2024-06-01 (×2): 50 ug via INTRAVENOUS

## 2024-06-01 MED ORDER — BUPIVACAINE-EPINEPHRINE (PF) 0.5% -1:200000 IJ SOLN
INTRAMUSCULAR | Status: AC
  Filled 2024-06-01: qty 30

## 2024-06-01 MED ORDER — LIDOCAINE HCL (PF) 2 % IJ SOLN
INTRAMUSCULAR | Status: AC
  Filled 2024-06-01: qty 5

## 2024-06-01 MED ORDER — BUPIVACAINE-EPINEPHRINE (PF) 0.5% -1:200000 IJ SOLN
INTRAMUSCULAR | Status: DC | PRN
  Administered 2024-06-01: 10 mL

## 2024-06-01 MED ORDER — ROCURONIUM BROMIDE 100 MG/10ML IV SOLN
INTRAVENOUS | Status: DC | PRN
  Administered 2024-06-01: 5 mg via INTRAVENOUS

## 2024-06-01 MED ORDER — SODIUM CHLORIDE (PF) 0.9 % IJ SOLN
INTRAMUSCULAR | Status: AC
  Filled 2024-06-01: qty 10

## 2024-06-01 MED ORDER — CHLORHEXIDINE GLUCONATE 0.12 % MT SOLN
15.0000 mL | Freq: Once | OROMUCOSAL | Status: AC
  Administered 2024-06-01: 15 mL via OROMUCOSAL

## 2024-06-01 MED ORDER — ROCURONIUM BROMIDE 10 MG/ML (PF) SYRINGE
PREFILLED_SYRINGE | INTRAVENOUS | Status: AC
  Filled 2024-06-01: qty 10

## 2024-06-01 MED ORDER — OXYCODONE HCL 5 MG/5ML PO SOLN: 5.0000 mg | Freq: Once | ORAL | Status: AC | PRN

## 2024-06-01 MED ORDER — 0.9 % SODIUM CHLORIDE (POUR BTL) OPTIME
TOPICAL | Status: DC | PRN
  Administered 2024-06-01: 500 mL

## 2024-06-01 MED ORDER — CHLORHEXIDINE GLUCONATE 0.12 % MT SOLN
OROMUCOSAL | Status: AC
  Filled 2024-06-01: qty 15

## 2024-06-01 MED ORDER — SUCCINYLCHOLINE CHLORIDE 200 MG/10ML IV SOSY
PREFILLED_SYRINGE | INTRAVENOUS | Status: DC | PRN
  Administered 2024-06-01: 100 mg via INTRAVENOUS

## 2024-06-01 MED ORDER — BUPIVACAINE LIPOSOME 1.3 % IJ SUSP
INTRAMUSCULAR | Status: AC
  Filled 2024-06-01: qty 10

## 2024-06-01 MED ORDER — ONDANSETRON HCL 4 MG/2ML IJ SOLN
INTRAMUSCULAR | Status: DC | PRN
  Administered 2024-06-01: 4 mg via INTRAVENOUS

## 2024-06-01 MED ORDER — FENTANYL CITRATE (PF) 100 MCG/2ML IJ SOLN: 25.0000 ug | INTRAMUSCULAR | Status: DC | PRN

## 2024-06-01 MED ORDER — OXYCODONE HCL 5 MG PO TABS
ORAL_TABLET | ORAL | Status: AC
  Filled 2024-06-01: qty 1

## 2024-06-01 MED ORDER — MIDAZOLAM HCL 2 MG/2ML IJ SOLN
INTRAMUSCULAR | Status: AC
  Filled 2024-06-01: qty 2

## 2024-06-01 MED ORDER — ORAL CARE MOUTH RINSE: 15.0000 mL | Freq: Once | OROMUCOSAL | Status: AC

## 2024-06-01 MED ORDER — DEXAMETHASONE SODIUM PHOSPHATE 10 MG/ML IJ SOLN
INTRAMUSCULAR | Status: DC | PRN
  Administered 2024-06-01: 5 mg via INTRAVENOUS

## 2024-06-01 SURGICAL SUPPLY — 36 items
BRUSH SCRUB EZ 4% CHG (MISCELLANEOUS) ×1 IMPLANT
BUR NEURO DRILL SOFT 3.0X3.8M (BURR) ×1 IMPLANT
CONTROL REMOTE FREELINK ALPHA (NEUROSURGERY SUPPLIES) IMPLANT
DERMABOND ADVANCED .7 DNX12 (GAUZE/BANDAGES/DRESSINGS) ×2 IMPLANT
DRAPE C-ARM XRAY 36X54 (DRAPES) ×1 IMPLANT
DRAPE LAPAROTOMY 100X77 ABD (DRAPES) ×1 IMPLANT
DRAPE LAPAROTOMY 77X122 PED (DRAPES) IMPLANT
DRSG OPSITE POSTOP 4X6 (GAUZE/BANDAGES/DRESSINGS) IMPLANT
DRSG OPSITE POSTOP 4X8 (GAUZE/BANDAGES/DRESSINGS) IMPLANT
ELECTRODE REM PT RTRN 9FT ADLT (ELECTROSURGICAL) ×1 IMPLANT
FEE INTRAOP CADWELL SUPPLY NCS (MISCELLANEOUS) IMPLANT
FEE INTRAOP MONITOR IMPULS NCS (MISCELLANEOUS) IMPLANT
GLOVE BIOGEL PI IND STRL 7.0 (GLOVE) ×1 IMPLANT
GLOVE BIOGEL PI IND STRL 8 (GLOVE) ×1 IMPLANT
GLOVE SRG 8 PF TXTR STRL LF DI (GLOVE) ×1 IMPLANT
GLOVE SURG SYN 7.0 PF PI (GLOVE) ×1 IMPLANT
GLOVE SURG SYN 7.5 PF PI (GLOVE) ×1 IMPLANT
GOWN SRG LRG LVL 4 IMPRV REINF (GOWNS) ×1 IMPLANT
GOWN SRG XL LVL 3 NONREINFORCE (GOWNS) ×1 IMPLANT
KIT CHARGING PRECISION NEURO (KITS) IMPLANT
KIT IPG ALPHA WAVEWRITER (Stimulator) IMPLANT
KIT SPINAL PRONEVIEW (KITS) ×1 IMPLANT
LAVAGE JET IRRISEPT WOUND (IRRIGATION / IRRIGATOR) ×1 IMPLANT
MANIFOLD NEPTUNE II (INSTRUMENTS) ×1 IMPLANT
NDL SAFETY ECLIPSE 18X1.5 (NEEDLE) ×1 IMPLANT
NS IRRIG 500ML POUR BTL (IV SOLUTION) ×1 IMPLANT
PACK LAMINECTOMY ARMC (PACKS) ×1 IMPLANT
PAD ARMBOARD POSITIONER FOAM (MISCELLANEOUS) ×1 IMPLANT
STAPLER SKIN PROX 35W (STAPLE) IMPLANT
SURGIFLO W/THROMBIN 8M KIT (HEMOSTASIS) ×1 IMPLANT
SUT SILK 2 0SH CR/8 30 (SUTURE) ×1 IMPLANT
SUT STRATA 3-0 15 PS-2 (SUTURE) IMPLANT
SUT VIC AB 0 CT1 18XCR BRD 8 (SUTURE) ×2 IMPLANT
SUT VIC AB 2-0 CT1 18 (SUTURE) ×2 IMPLANT
SYR 30ML LL (SYRINGE) ×2 IMPLANT
TRAP FLUID SMOKE EVACUATOR (MISCELLANEOUS) ×1 IMPLANT

## 2024-06-01 NOTE — Anesthesia Postprocedure Evaluation (Signed)
 Anesthesia Post Note  Patient: Kimberly Cowan  Procedure(s) Performed: SPINAL CORD STIMULATOR BATTERY EXCHANGE (Left: Back)  Patient location during evaluation: PACU Anesthesia Type: General Level of consciousness: awake and alert Pain management: pain level controlled Vital Signs Assessment: post-procedure vital signs reviewed and stable Respiratory status: spontaneous breathing, nonlabored ventilation, respiratory function stable and patient connected to nasal cannula oxygen Cardiovascular status: blood pressure returned to baseline and stable Postop Assessment: no apparent nausea or vomiting Anesthetic complications: no   No notable events documented.   Last Vitals:  Vitals:   06/01/24 0912 06/01/24 0932  BP:  (!) 136/90  Pulse: 64 72  Resp: 13 20  Temp:  (!) 36.4 C  SpO2: 93% 95%    Last Pain:  Vitals:   06/01/24 0932  TempSrc: Temporal  PainSc: 3                  [name]

## 2024-06-01 NOTE — Anesthesia Preprocedure Evaluation (Signed)
 Anesthesia Evaluation  Patient identified by MRN, date of birth, ID band Patient awake    Reviewed: Allergy & Precautions, NPO status , Patient's Chart, lab work & pertinent test results  History of Anesthesia Complications Negative for: history of anesthetic complications  Airway Mallampati: III  TM Distance: >3 FB Neck ROM: full    Dental no notable dental hx.    Pulmonary sleep apnea and Continuous Positive Airway Pressure Ventilation    Pulmonary exam normal        Cardiovascular negative cardio ROS Normal cardiovascular exam     Neuro/Psych  Headaches PSYCHIATRIC DISORDERS Anxiety Depression     Neuromuscular disease    GI/Hepatic Neg liver ROS,GERD  Medicated,,  Endo/Other  negative endocrine ROS    Renal/GU      Musculoskeletal   Abdominal   Peds  Hematology  (+) Blood dyscrasia, anemia   Anesthesia Other Findings Past Medical History: 12/31/2022: Acute respiratory failure with hypoxia (HCC) 1999: Allergy     Comment:  SEASONAL No date: Anemia No date: Anxiety 05/17/2018: Arm pain, left No date: Arthritis     Comment:  lower spine No date: Bilateral lumbar radiculopathy 03/03/2021: Cellulitis of abdominal wall No date: Chronic back pain 03/03/2021: Chronic GERD 04/04/2012: Clinical depression No date: Depression No date: DVT (deep venous thrombosis) (HCC) No date: Encounter for screening colonoscopy 06/04/2020: Essential (primary) hypertension No date: Glaucoma     Comment:  RIGHT EYE No date: Headache(784.0)     Comment:  migraines 08/15/2018: Major depressive disorder with single episode, in full  remission (HCC) 02/13/2021: Malfunction of jejunostomy tube (HCC) No date: Ovarian cyst No date: Pneumonia     Comment:  walking pneumonia 12/07/2022: Pneumonia of left lower lobe due to infectious organism 05/2017: Pulmonary embolism (HCC)     Comment:  Provoked, following knee  surgery 02/07/2021: S/P laparoscopic cholecystectomy 12/07/2022: Severe sepsis (HCC) No date: Sleep apnea     Comment:  no cpcap     last sleep study > 4 yrs 11/11/2016: Vasomotor symptoms due to menopause  Past Surgical History: 10/2006: ABDOMINAL HYSTERECTOMY 2014: BACK SURGERY     Comment:  lumbar fusion 2009: BACK SURGERY     Comment:  discectomy 07/2020: BARIATRIC SURGERY     Comment:  Revision 1989 1995: CESAREAN SECTION 01/2021: CHOLECYSTECTOMY 04/12/2018: COLONOSCOPY WITH PROPOFOL ; N/A     Comment:  Procedure: COLONOSCOPY WITH PROPOFOL ;  Surgeon: Unk Corinn Skiff, MD;  Location: [facility] ENDOSCOPY;  Service:               Gastroenterology;  Laterality: N/A; 2005: GASTRIC BYPASS 05/27/2017: KNEE ARTHROSCOPY WITH LATERAL MENISECTOMY; Left     Comment:  Procedure: KNEE ARTHROSCOPY WITH LATERAL MENISECTOMY;                Surgeon: Kathlynn Sharper, MD;  Location: [facility] ORS;                Service: Orthopedics;  Laterality: Left; 05/27/2017: KNEE ARTHROSCOPY WITH LATERAL RELEASE; Left     Comment:  Procedure: KNEE ARTHROSCOPY WITH LATERAL RELEASE;                Surgeon: Kathlynn Sharper, MD;  Location: [facility] ORS;                Service: Orthopedics;  Laterality: Left; 10/05/2016: LAPAROSCOPIC SALPINGO OOPHERECTOMY; Bilateral     Comment:  Procedure: LAPAROSCOPIC SALPINGO OOPHORECTOMY;  Surgeon:  Gladis DELENA Dollar, MD;  Location: [facility] ORS;  Service:               Gynecology;  Laterality: Bilateral; No date: REDUCTION MAMMAPLASTY; Bilateral     Comment:  2001 03/20/2016: SPINAL CORD STIMULATOR INSERTION; N/A     Comment:  Procedure: LUMBAR SPINAL CORD STIMULATOR INSERTION;                Surgeon: Alm GORMAN Molt, MD;  Location: [facility];                Service: Neurosurgery;  Laterality: N/A; 2009 2014 2017: SPINE SURGERY No date: TONSILLECTOMY  BMI    Body Mass Index: 33.67 kg/m      Reproductive/Obstetrics negative OB ROS                               Anesthesia Physical Anesthesia Plan  ASA: 2  Anesthesia Plan: General ETT   Post-op Pain Management: Toradol  IV (intra-op)* and Ofirmev  IV (intra-op)*   Induction: Intravenous  PONV Risk Score and Plan: 3 and Ondansetron , Dexamethasone , Midazolam  and Treatment may vary due to age or medical condition  Airway Management Planned: Oral ETT  Additional Equipment:   Intra-op Plan:   Post-operative Plan: Extubation in OR  Informed Consent: I have reviewed the patients History and Physical, chart, labs and discussed the procedure including the risks, benefits and alternatives for the proposed anesthesia with the patient or authorized representative who has indicated his/her understanding and acceptance.     Dental Advisory Given  Plan Discussed with: Anesthesiologist, CRNA and Surgeon  Anesthesia Plan Comments: (Patient consented for risks of anesthesia including but not limited to:  - adverse reactions to medications - damage to eyes, teeth, lips or other oral mucosa - nerve damage due to positioning  - sore throat or hoarseness - Damage to heart, brain, nerves, lungs, other parts of body or loss of life  Patient voiced understanding and assent.)         Anesthesia Quick Evaluation

## 2024-06-01 NOTE — H&P (View-Only) (Signed)
 Referring Physician:  No referring provider defined for this encounter.  Primary Physician:  Justus Leita DEL, MD  History of Present Illness: 04/19/2024 Kimberly Cowan is here today with a chief complaint of need for battery replacement.  She is currently managed by [facility] pain management.  Has a spinal cord stimulator with [employer].  She would like to have a battery exchange given need for excessive charging.  She was referred to us  for removal and replacement of her IPG.  Conservative measures:  Physical therapy:  has not participated in PT Multimodal medical therapy including regular antiinflammatories: Flexeril , Hydrocodone , Gabapentin   Injections: no epidural steroid injections  Past Surgery: 12/19/2021 Lumbar fusion L4-5 Surgeron: Victory Gunnels, MD SCS placed 03/20/2016 [employer]  The symptoms are causing a significant impact on the patient's life.   I have utilized the care everywhere function in epic to review the outside records available from external health systems.  Review of Systems:  A 10 point review of systems is negative, except for the pertinent positives and negatives detailed in the HPI.  Past Medical History: Past Medical History:  Diagnosis Date   Acute respiratory failure with hypoxia (HCC) 12/31/2022   Allergy 1999   SEASONAL   Anemia    Anxiety    Arm pain, left 05/17/2018   Arthritis    lower spine   Bilateral lumbar radiculopathy    Cellulitis of abdominal wall 03/03/2021   Chronic back pain    Chronic GERD 03/03/2021   Clinical depression 04/04/2012   Depression    DVT (deep venous thrombosis) (HCC)    Encounter for screening colonoscopy    Essential (primary) hypertension 06/04/2020   Glaucoma    RIGHT EYE   Headache(784.0)    migraines   Major depressive disorder with single episode, in full remission (HCC) 08/15/2018   Malfunction of jejunostomy tube (HCC) 02/13/2021   Ovarian cyst    Pneumonia    walking  pneumonia   Pneumonia of left lower lobe due to infectious organism 12/07/2022   Pulmonary embolism (HCC) 05/2017   Provoked, following knee surgery   S/P laparoscopic cholecystectomy 02/07/2021   Severe sepsis (HCC) 12/07/2022   Sleep apnea    no cpcap     last sleep study > 4 yrs   Vasomotor symptoms due to menopause 11/11/2016    Past Surgical History: Past Surgical History:  Procedure Laterality Date   ABDOMINAL HYSTERECTOMY  10/2006   BACK SURGERY  2014   lumbar fusion   BACK SURGERY  2009   discectomy   BARIATRIC SURGERY  07/2020   Revision   CESAREAN SECTION  1989 1995   CHOLECYSTECTOMY  01/2021   COLONOSCOPY WITH PROPOFOL  N/A 04/12/2018   Procedure: COLONOSCOPY WITH PROPOFOL ;  Surgeon: Unk Corinn Skiff, MD;  Location: [facility];  Service: Gastroenterology;  Laterality: N/A;   GASTRIC BYPASS  2005   KNEE ARTHROSCOPY WITH LATERAL MENISECTOMY Left 05/27/2017   Procedure: KNEE ARTHROSCOPY WITH LATERAL MENISECTOMY;  Surgeon: Kathlynn Sharper, MD;  Location: [facility];  Service: Orthopedics;  Laterality: Left;   KNEE ARTHROSCOPY WITH LATERAL RELEASE Left 05/27/2017   Procedure: KNEE ARTHROSCOPY WITH LATERAL RELEASE;  Surgeon: Kathlynn Sharper, MD;  Location: [facility];  Service: Orthopedics;  Laterality: Left;   LAPAROSCOPIC SALPINGO OOPHERECTOMY Bilateral 10/05/2016   Procedure: LAPAROSCOPIC SALPINGO OOPHORECTOMY;  Surgeon: Gladis DELENA Dollar, MD;  Location: [facility];  Service: Gynecology;  Laterality: Bilateral;   REDUCTION MAMMAPLASTY Bilateral    2001   SPINAL  CORD STIMULATOR INSERTION N/A 03/20/2016   Procedure: LUMBAR SPINAL CORD STIMULATOR INSERTION;  Surgeon: Alm GORMAN Molt, MD;  Location: [facility];  Service: Neurosurgery;  Laterality: N/A;   SPINE SURGERY  2009 2014 2017   TONSILLECTOMY      Allergies: Allergies as of 04/24/2024 - Review Complete 04/19/2024  Allergen Reaction Noted   Morphine  and codeine  Hives, Shortness Of Breath, Nausea And Vomiting, and  Other (See Comments) 10/28/2012   Penicillins Anaphylaxis, Hives, Nausea And Vomiting, Shortness Of Breath, and Rash 10/28/2012   Latex Hives 08/12/2015   Baclofen  Nausea Only 03/18/2016    Medications:  Current Facility-Administered Medications:    chlorhexidine  (PERIDEX ) 0.12 % solution 15 mL, 15 mL, Mouth/Throat, Once **OR** Oral care mouth rinse, 15 mL, Mouth Rinse, Once, Vicci, Camellia Glatter, MD   lactated ringers  infusion, , Intravenous, Continuous, Vicci Camellia Glatter, MD   vancomycin  LUCRECIA) IVPB 1000 mg/200 mL, 1,000 mg, Intravenous, Once, Claudene Penne ORN, MD  Social History: Social History   Tobacco Use   Smoking status: Never   Smokeless tobacco: Never   Tobacco comments:    smoking cessation materials not required  Vaping Use   Vaping status: Every Day   Substances: Nicotine  Substance Use Topics   Alcohol use: No   Drug use: No    Family Medical History: Family History  Problem Relation Age of Onset   Diabetes Mother    Hypertension Mother    Obesity Mother    Arthritis Mother    Varicose Veins Mother    Diabetes Sister    Hypertension Sister    Arthritis Sister    Hypertension Father    Arthritis Father    Cancer Father    Obesity Father    Obesity Daughter    Obesity Sister    Arthritis Sister    Obesity Sister    Diabetes Maternal Grandmother    Cancer Maternal Aunt    Diabetes Maternal Aunt    Breast cancer Neg Hx     Physical Examination:   NEUROLOGICAL:     Awake, alert, oriented to person, place, and time.  Speech is clear and fluent.   Cranial Nerves: Pupils equal round and reactive to light.  Facial tone is symmetric.  Facial sensation is symmetric. Shoulder shrug is symmetric. Tongue protrusion is midline.    Strength: No major deficits noted  Gait is normal.    Imaging: No new imaging to review   Medical Decision Making/Assessment and Plan: 1. Battery end of life of spinal cord stimulator 2. Chronic radicular  lumbar pain 3. Spinal cord stimulator status [employer], implanted 2017) 4. Failed back surgical syndrome 5. Chronic pain syndrome   Kimberly Cowan is a 58 year old woman with history of chronic radicular pain, failed back syndrome, chronic pain syndrome, spinal cord stimulator last implanted in 2017, here today to discuss battery exchange given need for excessive charging.  She has been under the care of [facility] pain, she has not had any major issues with her spinal cord stimulator.  She would like to have her battery exchanged for continued therapy.  We discussed risk and benefits of surgery including but not limited to bleeding, infection, damage to her existing system.  She understands the risks and wanted to go forward with surgery to remove and replace her IPG for the SCS system  Thank you for involving me in the care of this patient.    Penne MICAEL Claudene MD/MSCR Neurosurgery

## 2024-06-01 NOTE — Anesthesia Postprocedure Evaluation (Signed)
 Anesthesia Post Note  Patient: Kimberly Cowan  Procedure(s) Performed: SPINAL CORD STIMULATOR BATTERY EXCHANGE (Left: Back)  Anesthesia Type: General Anesthetic complications: no   No notable events documented.   Last Vitals:  Vitals:   06/01/24 0912 06/01/24 0932  BP:  (!) 136/90  Pulse: 64 72  Resp: 13 20  Temp:  (!) 36.4 C  SpO2: 93% 95%    Last Pain:  Vitals:   06/01/24 0932  TempSrc: Temporal  PainSc:                  [name]

## 2024-06-01 NOTE — Op Note (Signed)
 Indications: the patient is a 58yo female who is due for a IPG replacement for her spinal cord stimulator.  This has been continued to give her relief so she like to have a replacement   Findings: Successful replacement of IPG  Preoperative Diagnosis: Z45.42 Battery end of life of spinal cord stimulator  Postoperative Diagnosis: Z45.42 Battery end of life of spinal cord stimulator     EBL: Minimal IVF: see anesthesia record Drains: none Disposition: Extubated and Stable to PACU Complications: none   No foley catheter was placed.     Preoperative Note:    Risks of surgery discussed in clinic.   Procedure: Removal and replacement of IPG for spinal cord stimulator at end-of-life.  Operative Note:      The patient was then brought from the preoperative center with intravenous access established.  The patient underwent general anesthesia and endotracheal tube intubation, then was rotated on the [facility] table where all pressure points were appropriately padded.   A comprehensive timeout was performed verifying the patient's name, MRN, and planned procedure.   The incision on the flank was then opened and the IPG was encountered.  We were able to dissect over top of this without the use of a Bovie cautery once we are able to see it.  We did not Bovie on any of the leads.  We were able to open the pocket sharply.  We then remove the stimulator.  It appeared to be in good physical status.  We then placed the leads into the same orientation into the new battery.  The pocket was copiously irrigated.  It is partially supra lysed.  A superior site was planned.  We made the pocket for this.  We then sutured the pocket closed after antibiotic irrigation.  There was meticulous hemostasis.  Both sites were irrigated.  The wounds were closed in layers with 0 and 2-0 vicryl.  The skin was approximated with STRATAFIX and glue . A sterile dressing was applied.   Patient was then rotated back to the  preoperative bed awakened from anesthesia and taken to recovery. All counts are correct in this case.  I performed this procedure without an Designer, television/film set.  Implant Name Type Inv. Item Serial No. Manufacturer Lot No. LRB No. Used Action  KIT IPG ALPHA WAVEWRITER - [id number] Stimulator KIT IPG ALPHA WAVEWRITER [id number] [employer] [id number] Left 1 Implanted    Penne MICAEL Sharps, MD

## 2024-06-01 NOTE — Interval H&P Note (Signed)
 History and Physical Interval Note:  06/01/2024 7:00 AM  Kimberly Cowan  has presented today for surgery, with the diagnosis of Z45.42 Battery end of life of spinal cord stimulator.  The various methods of treatment have been discussed with the patient and family. After consideration of risks, benefits and other options for treatment, the patient has consented to  Procedure(s) with comments: SPINAL CORD STIMULATOR BATTERY EXCHANGE (Left) - Removal and replacement of Internal Pulse Generator (IPG) as a surgical intervention.  The patient's history has been reviewed, patient examined, no change in status, stable for surgery.  I have reviewed the patient's chart and labs.  Questions were answered to the patient's satisfaction.    Heart and Lungs are clear.   [name]

## 2024-06-01 NOTE — Discharge Instructions (Signed)
 Your surgeon has performed an procedure implanting a Battery for Spinal Cord Stimulator Alone. This involved making an incision in the Flank.   The following are instructions to help in your recovery once you have been discharged from the hospital. Even if you feel well, it is important that you follow these activity guidelines. If you do not let your body heal properly from the surgery, you can increase the chance of return of your symptoms and other complications.  You will be working with your spinal cord stimulator team for programming and titrations.  Most patients require multiple titrations before we get the settings just right.  We appreciate your patience and working with [place] to optimize your care.  *NSAIDs are okay to take after surgery  Activity    No bending, lifting, or twisting ("BLT"). Avoid lifting objects heavier than 10 pounds (gallon milk jug).  Where possible, avoid household activities that involve lifting, bending, reaching, pushing, or pulling such as laundry, vacuuming, grocery shopping, and childcare. Try to arrange for help from friends and family for these activities while your back heals.  Increase physical activity slowly as tolerated.  Taking short walks is encouraged, but avoid strenuous exercise. Do not jog, run, bicycle, lift weights, or participate in any other exercises unless specifically allowed by your doctor.  Talk to your doctor before resuming sexual activity.  You should not drive until cleared by your doctor.  Until released by your doctor, you should not return to work or school.  You should rest at home and let your body heal.   You may shower three days after your surgery.  After showering, lightly dab your incision dry. Do not take a tub bath or go swimming until approved by your doctor at your follow-up appointment.  If you smoke, we strongly recommend that you quit.  Smoking has been proven to interfere with normal bone healing and will dramatically  reduce the success rate of your surgery. Please contact [facility] (800-QUIT-NOW) and use the resources at www.[facility].com for assistance in stopping smoking.  Surgical Incision   If you have a dressing on your incision, you may remove it 3 days after your surgery. Keep your incision area clean and dry.  Your stitches are under your skin and dissolvable.  They are covered by surgical glue.  The glue should begin to peel away within about a week.  Diet           You may return to your usual diet. Be sure to stay hydrated.  When to Contact us  You may experience pain in your neck and/or pain between your shoulder blades, or at your back depending on your incision sites.. This is normal and should improve in the next few weeks with the help of pain medication, muscle relaxers, and rest. Some patients report that a warm compress on the back of the neck or between the shoulder blades helps.  However, should you experience any of the following, contact us immediately: New numbness or weakness Pain that is progressively getting worse, and is not relieved by your pain medication, muscle relaxers, rest, and warm compresses Bleeding, redness, swelling, pain, or drainage from surgical incision Chills or flu-like symptoms Fever greater than 101.0 F (38.3 C) Inability to eat, drink fluids, or take medications Problems with bowel or bladder functions Difficulty breathing or shortness of breath Warmth, tenderness, or swelling in your calf Contact Information How to contact us:  If you have any questions/concerns before or after surgery, you  can reach [place] at [phone], or you can send a mychart message. We can be reached by phone or mychart 8am-4pm, Monday-Friday.  *Please note: Calls after 4pm are forwarded to a third party answering service. Mychart messages are not routinely monitored during evenings, weekends, and holidays. Please call our office to contact the answering service for urgent  concerns during non-business hours.

## 2024-06-01 NOTE — Interval H&P Note (Signed)
 History and Physical Interval Note:  06/01/2024 6:54 AM  Kimberly Cowan  has presented today for surgery, with the diagnosis of Z45.42 Battery end of life of spinal cord stimulator.  The various methods of treatment have been discussed with the patient and family. After consideration of risks, benefits and other options for treatment, the patient has consented to  Procedure(s) with comments: SPINAL CORD STIMULATOR BATTERY EXCHANGE (Left) - Removal and replacement of Internal Pulse Generator (IPG) as a surgical intervention.  The patient's history has been reviewed, patient examined, no change in status, stable for surgery.  I have reviewed the patient's chart and labs.  Questions were answered to the patient's satisfaction.    Heart and lungs clear, patient asking about reposition as well, I did let her know that repositiong comes with a significantly higher risk of lead fracture, failure, infection, and complication.   [name]

## 2024-06-01 NOTE — Anesthesia Procedure Notes (Signed)
 Procedure Name: Intubation Date/Time: 06/01/2024 7:27 AM  Performed by: Veronica Alm BROCKS, CRNAPre-anesthesia Checklist: Patient identified, Patient being monitored, Timeout performed, Emergency Drugs available and Suction available Patient Re-evaluated:Patient Re-evaluated prior to induction Oxygen Delivery Method: Circle system utilized Preoxygenation: Pre-oxygenation with 100% oxygen Induction Type: IV induction Ventilation: Mask ventilation without difficulty Laryngoscope Size: 3 and McGrath Grade View: Grade I Tube type: Oral Tube size: 7.0 mm Number of attempts: 1 Airway Equipment and Method: Stylet Placement Confirmation: ETT inserted through vocal cords under direct vision, positive ETCO2 and breath sounds checked- equal and bilateral Secured at: 22 cm Tube secured with: Tape Dental Injury: Teeth and Oropharynx as per pre-operative assessment

## 2024-06-01 NOTE — Progress Notes (Signed)
 Referring Physician:  No referring provider defined for this encounter.  Primary Physician:  Justus Leita DEL, MD  History of Present Illness: 04/19/2024 Kimberly Cowan is here today with a chief complaint of need for battery replacement.  She is currently managed by [facility] pain management.  Has a spinal cord stimulator with [employer].  She would like to have a battery exchange given need for excessive charging.  She was referred to us  for removal and replacement of her IPG.  Conservative measures:  Physical therapy:  has not participated in PT Multimodal medical therapy including regular antiinflammatories: Flexeril , Hydrocodone , Gabapentin   Injections: no epidural steroid injections  Past Surgery: 12/19/2021 Lumbar fusion L4-5 Surgeron: Victory Gunnels, MD SCS placed 03/20/2016 [employer]  The symptoms are causing a significant impact on the patient's life.   I have utilized the care everywhere function in epic to review the outside records available from external health systems.  Review of Systems:  A 10 point review of systems is negative, except for the pertinent positives and negatives detailed in the HPI.  Past Medical History: Past Medical History:  Diagnosis Date   Acute respiratory failure with hypoxia (HCC) 12/31/2022   Allergy 1999   SEASONAL   Anemia    Anxiety    Arm pain, left 05/17/2018   Arthritis    lower spine   Bilateral lumbar radiculopathy    Cellulitis of abdominal wall 03/03/2021   Chronic back pain    Chronic GERD 03/03/2021   Clinical depression 04/04/2012   Depression    DVT (deep venous thrombosis) (HCC)    Encounter for screening colonoscopy    Essential (primary) hypertension 06/04/2020   Glaucoma    RIGHT EYE   Headache(784.0)    migraines   Major depressive disorder with single episode, in full remission (HCC) 08/15/2018   Malfunction of jejunostomy tube (HCC) 02/13/2021   Ovarian cyst    Pneumonia    walking  pneumonia   Pneumonia of left lower lobe due to infectious organism 12/07/2022   Pulmonary embolism (HCC) 05/2017   Provoked, following knee surgery   S/P laparoscopic cholecystectomy 02/07/2021   Severe sepsis (HCC) 12/07/2022   Sleep apnea    no cpcap     last sleep study > 4 yrs   Vasomotor symptoms due to menopause 11/11/2016    Past Surgical History: Past Surgical History:  Procedure Laterality Date   ABDOMINAL HYSTERECTOMY  10/2006   BACK SURGERY  2014   lumbar fusion   BACK SURGERY  2009   discectomy   BARIATRIC SURGERY  07/2020   Revision   CESAREAN SECTION  1989 1995   CHOLECYSTECTOMY  01/2021   COLONOSCOPY WITH PROPOFOL  N/A 04/12/2018   Procedure: COLONOSCOPY WITH PROPOFOL ;  Surgeon: Unk Corinn Skiff, MD;  Location: [facility];  Service: Gastroenterology;  Laterality: N/A;   GASTRIC BYPASS  2005   KNEE ARTHROSCOPY WITH LATERAL MENISECTOMY Left 05/27/2017   Procedure: KNEE ARTHROSCOPY WITH LATERAL MENISECTOMY;  Surgeon: Kathlynn Sharper, MD;  Location: [facility];  Service: Orthopedics;  Laterality: Left;   KNEE ARTHROSCOPY WITH LATERAL RELEASE Left 05/27/2017   Procedure: KNEE ARTHROSCOPY WITH LATERAL RELEASE;  Surgeon: Kathlynn Sharper, MD;  Location: [facility];  Service: Orthopedics;  Laterality: Left;   LAPAROSCOPIC SALPINGO OOPHERECTOMY Bilateral 10/05/2016   Procedure: LAPAROSCOPIC SALPINGO OOPHORECTOMY;  Surgeon: Gladis DELENA Dollar, MD;  Location: [facility];  Service: Gynecology;  Laterality: Bilateral;   REDUCTION MAMMAPLASTY Bilateral    2001   SPINAL  CORD STIMULATOR INSERTION N/A 03/20/2016   Procedure: LUMBAR SPINAL CORD STIMULATOR INSERTION;  Surgeon: Alm GORMAN Molt, MD;  Location: [facility];  Service: Neurosurgery;  Laterality: N/A;   SPINE SURGERY  2009 2014 2017   TONSILLECTOMY      Allergies: Allergies as of 04/24/2024 - Review Complete 04/19/2024  Allergen Reaction Noted   Morphine  and codeine  Hives, Shortness Of Breath, Nausea And Vomiting, and  Other (See Comments) 10/28/2012   Penicillins Anaphylaxis, Hives, Nausea And Vomiting, Shortness Of Breath, and Rash 10/28/2012   Latex Hives 08/12/2015   Baclofen  Nausea Only 03/18/2016    Medications:  Current Facility-Administered Medications:    chlorhexidine  (PERIDEX ) 0.12 % solution 15 mL, 15 mL, Mouth/Throat, Once **OR** Oral care mouth rinse, 15 mL, Mouth Rinse, Once, Vicci, Camellia Glatter, MD   lactated ringers  infusion, , Intravenous, Continuous, Vicci Camellia Glatter, MD   vancomycin  LUCRECIA) IVPB 1000 mg/200 mL, 1,000 mg, Intravenous, Once, Claudene Penne ORN, MD  Social History: Social History   Tobacco Use   Smoking status: Never   Smokeless tobacco: Never   Tobacco comments:    smoking cessation materials not required  Vaping Use   Vaping status: Every Day   Substances: Nicotine  Substance Use Topics   Alcohol use: No   Drug use: No    Family Medical History: Family History  Problem Relation Age of Onset   Diabetes Mother    Hypertension Mother    Obesity Mother    Arthritis Mother    Varicose Veins Mother    Diabetes Sister    Hypertension Sister    Arthritis Sister    Hypertension Father    Arthritis Father    Cancer Father    Obesity Father    Obesity Daughter    Obesity Sister    Arthritis Sister    Obesity Sister    Diabetes Maternal Grandmother    Cancer Maternal Aunt    Diabetes Maternal Aunt    Breast cancer Neg Hx     Physical Examination:   NEUROLOGICAL:     Awake, alert, oriented to person, place, and time.  Speech is clear and fluent.   Cranial Nerves: Pupils equal round and reactive to light.  Facial tone is symmetric.  Facial sensation is symmetric. Shoulder shrug is symmetric. Tongue protrusion is midline.    Strength: No major deficits noted  Gait is normal.    Imaging: No new imaging to review   Medical Decision Making/Assessment and Plan: 1. Battery end of life of spinal cord stimulator 2. Chronic radicular  lumbar pain 3. Spinal cord stimulator status [employer], implanted 2017) 4. Failed back surgical syndrome 5. Chronic pain syndrome   Kimberly Cowan is a 58 year old woman with history of chronic radicular pain, failed back syndrome, chronic pain syndrome, spinal cord stimulator last implanted in 2017, here today to discuss battery exchange given need for excessive charging.  She has been under the care of [facility] pain, she has not had any major issues with her spinal cord stimulator.  She would like to have her battery exchanged for continued therapy.  We discussed risk and benefits of surgery including but not limited to bleeding, infection, damage to her existing system.  She understands the risks and wanted to go forward with surgery to remove and replace her IPG for the SCS system  Thank you for involving me in the care of this patient.    Penne MICAEL Claudene MD/MSCR Neurosurgery

## 2024-06-01 NOTE — Transfer of Care (Signed)
 Immediate Anesthesia Transfer of Care Note  Patient: Kimberly Cowan  Procedure(s) Performed: SPINAL CORD STIMULATOR BATTERY EXCHANGE (Left: Back)  Patient Location: PACU  Anesthesia Type:General  Level of Consciousness: awake and alert   Airway & Oxygen Therapy: Patient Spontanous Breathing and Patient connected to face mask oxygen  Post-op Assessment: Report given to RN and Post -op Vital signs reviewed and stable  Post vital signs: Reviewed and stable  Last Vitals:  Vitals Value Taken Time  BP 137/82 06/01/24 08:30  Temp 36.7 C 06/01/24 08:30  Pulse 68 06/01/24 08:40  Resp 11 06/01/24 08:40  SpO2 92 % 06/01/24 08:40  Vitals shown include unfiled device data.  Last Pain:  Vitals:   06/01/24 0830  PainSc: 0-No pain         Complications: No notable events documented.

## 2024-06-02 ENCOUNTER — Encounter: Payer: Self-pay | Admitting: Neurosurgery

## 2024-06-14 ENCOUNTER — Encounter: Admitting: Physician Assistant

## 2024-06-14 ENCOUNTER — Telehealth: Payer: Self-pay

## 2024-06-14 NOTE — Telephone Encounter (Signed)
 Ok. The reps said they may not be able to make it on Friday because they have several other patients already scheduled that day, but they will try. It is really important we see her ASAP to check her incision. If she needs to cancel again, we will need to get her rescheduled to another appointment ASAP but also have her send a pic of her incision via mychart in the meantime.

## 2024-06-14 NOTE — Telephone Encounter (Signed)
 Patient notified and expressed understanding. Patient will have her son take a picture when he gets home.

## 2024-06-14 NOTE — Telephone Encounter (Signed)
 Can she come on Friday to see Dr Claudene at 11:15am?  Let me know and I will notify the reps of the new appt.

## 2024-06-14 NOTE — Telephone Encounter (Signed)
-----   Message from Revere B sent at 06/14/2024  8:31 AM EDT ----- Regarding: Today's appt Patient cancelled her appointment for today due to lack of transportation. Can you let the rep know? Also, where can we get her worked back in with this being her first post op?

## 2024-06-15 ENCOUNTER — Encounter: Payer: Self-pay | Admitting: Neurosurgery

## 2024-06-16 ENCOUNTER — Ambulatory Visit (INDEPENDENT_AMBULATORY_CARE_PROVIDER_SITE_OTHER): Admitting: Neurosurgery

## 2024-06-16 ENCOUNTER — Encounter: Payer: Self-pay | Admitting: Neurosurgery

## 2024-06-16 VITALS — BP 130/82 | Temp 99.0°F | Ht 67.0 in | Wt 216.2 lb

## 2024-06-16 DIAGNOSIS — Z9689 Presence of other specified functional implants: Secondary | ICD-10-CM

## 2024-06-16 DIAGNOSIS — Z4542 Encounter for adjustment and management of neuropacemaker (brain) (peripheral nerve) (spinal cord): Secondary | ICD-10-CM | POA: Diagnosis not present

## 2024-06-16 DIAGNOSIS — G894 Chronic pain syndrome: Secondary | ICD-10-CM

## 2024-06-16 NOTE — Progress Notes (Signed)
   DOS: 06/01/24  HISTORY OF PRESENT ILLNESS: 06/16/2024 [name] is status post spinal cord stimulator battery replacement overall she is doing well.Kimberly Cowan   PHYSICAL EXAMINATION:   Vitals:   06/16/24 1059  BP: 130/82  Temp: 99 F (37.2 C)   General: Patient is well developed, well nourished, calm, collected, and in no apparent distress.  NEUROLOGICAL:  General: In no acute distress.  Awake, alert, oriented to person, place, and time. Pupils equal round and reactive to light.   Strength: Ambulating unassisted  Incision c/d/i, covered with glue  ROS (Neurologic): Negative except as noted above  IMAGING: No interval imaging to review   ASSESSMENT/PLAN:  Kimberly Cowan is doing well after her spinal cord stimulator battery replacement.  She is looking forward to getting reprogrammed and she can back up to her baseline stimulation level.  Understandably she has had a worsening pain control after replacement as she has not been at her baseline level.  Working on coordinating of follow-up/programming visit with the stimulator team.  Will plan to see her back as scheduled.  Kimberly Cowan Sharps, MD/MS Department of Neurosurgery

## 2024-06-20 ENCOUNTER — Ambulatory Visit: Attending: Student in an Organized Health Care Education/Training Program | Admitting: Nurse Practitioner

## 2024-06-20 ENCOUNTER — Encounter: Payer: Self-pay | Admitting: Nurse Practitioner

## 2024-06-20 VITALS — BP 147/96 | HR 85 | Temp 98.0°F | Ht 67.0 in | Wt 213.0 lb

## 2024-06-20 DIAGNOSIS — M961 Postlaminectomy syndrome, not elsewhere classified: Secondary | ICD-10-CM | POA: Diagnosis not present

## 2024-06-20 DIAGNOSIS — G894 Chronic pain syndrome: Secondary | ICD-10-CM | POA: Diagnosis not present

## 2024-06-20 DIAGNOSIS — Z79891 Long term (current) use of opiate analgesic: Secondary | ICD-10-CM | POA: Diagnosis not present

## 2024-06-20 DIAGNOSIS — M5416 Radiculopathy, lumbar region: Secondary | ICD-10-CM | POA: Diagnosis not present

## 2024-06-20 DIAGNOSIS — G8929 Other chronic pain: Secondary | ICD-10-CM | POA: Insufficient documentation

## 2024-06-20 DIAGNOSIS — Z981 Arthrodesis status: Secondary | ICD-10-CM | POA: Diagnosis not present

## 2024-06-20 MED ORDER — HYDROCODONE-ACETAMINOPHEN 10-325 MG PO TABS: 1.0000 | ORAL_TABLET | Freq: Three times a day (TID) | ORAL | 0 refills | Status: AC | PRN

## 2024-06-20 NOTE — Progress Notes (Signed)
 Safety precautions to be maintained throughout the outpatient stay will include: orient to surroundings, keep bed in low position, maintain call bell within reach at all times, provide assistance with transfer out of bed and ambulation.   Nursing Pain Medication Assessment:  Safety precautions to be maintained throughout the outpatient stay will include: orient to surroundings, keep bed in low position, maintain call bell within reach at all times, provide assistance with transfer out of bed and ambulation.  Medication Inspection Compliance: Pill count conducted under aseptic conditions, in front of the patient. Neither the pills nor the bottle was removed from the patient's sight at any time. Once count was completed pills were immediately returned to the patient in their original bottle.  Medication: Hydrocodone /APAP Pill/Patch Count: pt states 2 pills in pill box at home; 0 in bottle of 90 pills/patches remain Pill/Patch Appearance: Markings consistent with prescribed medication Bottle Appearance: Standard pharmacy container. Clearly labeled. Filled Date: 7 / 1 / 2025 Last Medication intake:  Yesterday

## 2024-06-20 NOTE — Progress Notes (Signed)
 PROVIDER NOTE: Interpretation of information contained herein should be left to medically-trained personnel. Specific patient instructions are provided elsewhere under Patient Instructions section of medical record. This document was created in part using AI and STT-dictation technology, any transcriptional errors that may result from this process are unintentional.  Patient: Kimberly Cowan  Service: E/M   PCP: Justus Leita DEL, MD  DOB: 1966/02/25  DOS: 06/20/2024  Provider: Emmy MARLA Blanch, NP  MRN: [id number]  Delivery: Face-to-face  Specialty: Interventional Pain Management  Type: Established Patient  Setting: Ambulatory outpatient facility  Specialty designation: 09  Referring Prov.: Justus Leita DEL, MD  Location: Outpatient office facility       History of present illness (HPI) Kimberly Cowan, a 58 y.o. 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 Kimberly Cowan primary complain today is Back Pain (Lower back and bilateral legs)  Pertinent problems: Kimberly Cowan has Failed back surgical syndrome; Spinal stenosis of lumbar region; Spinal cord stimulator status [employer], implanted 2017); History of lumbar fusion (L5-S1 PSIF); Encounter for long-term opiate analgesic use; Chronic pain syndrome; and Pain management contract signed on their pertinent problem list.  Pain Assessment: Severity of Chronic pain is reported as a 9 /10. Location: Back Lower/radiates down both legs to bottom of toes. Onset: More than a month ago. Quality: Shooting, Numbness, Tingling, Throbbing. Timing: Constant. Modifying factor(s): laying down. Vitals:  height is 5' 7 (1.702 m) and weight is 213 lb (96.6 kg). Her temperature is 98 F (36.7 C). Her blood pressure is 147/96 (abnormal) and her pulse is 85. Her oxygen saturation is 98%.  BMI: Estimated body mass index is 33.36 kg/m as calculated from the following:   Height as of this  encounter: 5' 7 (1.702 m).   Weight as of this encounter: 213 lb (96.6 kg).  Last encounter: 03/28/2024 Last procedure: Visit date not found.  Reason for encounter: medication management. No change in medical history since last visit.  Patient's pain is at baseline.  Patient continues multimodal pain regimen as prescribed.  States that it provides pain relief and improvement in functional status.  The patient has a history of L4-S1 spinal fusion in underwent implantation of a [employer] spinal cord stimulator in 2017, which has been beneficial in managing her low back pain.  She is also prescribed Norco 10-325 mg, taken nightly, with a 90-day supply at provided. Pharmacotherapy Assessment   Norco 10-325 mg every 8 hours as needed for pain.  Nightly only. MME=30 Monitoring: [place] PMP: PDMP reviewed during this encounter.       Pharmacotherapy: No side-effects or adverse reactions reported. Compliance: No problems identified. Effectiveness: Clinically acceptable.  Margrette Nathanel PARAS, RN  06/20/2024  2:25 PM  Sign when Signing Visit Safety precautions to be maintained throughout the outpatient stay will include: orient to surroundings, keep bed in low position, maintain call bell within reach at all times, provide assistance with transfer out of bed and ambulation.   Nursing Pain Medication Assessment:  Safety precautions to be maintained throughout the outpatient stay will include: orient to surroundings, keep bed in low position, maintain call bell within reach at all times, provide assistance with transfer out of bed and ambulation.  Medication Inspection Compliance: Pill count conducted under aseptic conditions, in front of the patient. Neither the pills nor the bottle was removed from the patient's sight at any time. Once count was completed pills were immediately returned to the patient in their  original bottle.  Medication: Hydrocodone /APAP Pill/Patch Count: pt states 2 pills in pill box  at home; 0 in bottle of 90 pills/patches remain Pill/Patch Appearance: Markings consistent with prescribed medication Bottle Appearance: Standard pharmacy container. Clearly labeled. Filled Date: 7 / 1 / 2025 Last Medication intake:  Yesterday    UDS:  Summary  Date Value Ref Range Status  03/28/2024 FINAL  Final    Comment:    ==================================================================== [employer] [facility] [employer] (MW) ==================================================================== Test                             Result       Flag       Units  Drug Present and Declared for Prescription Verification   Oxazepam                       696          EXPECTED   ng/mg creat   Temazepam                       >3846        EXPECTED   ng/mg creat    Oxazepam and temazepam  are expected metabolites of diazepam .    Oxazepam is also an expected metabolite of other benzodiazepine    drugs, including chlordiazepoxide, prazepam, clorazepate, halazepam,    and temazepam .  Oxazepam and temazepam  are available as scheduled    prescription medications.    Norhydrocodone                 338          EXPECTED   ng/mg creat    Norhydrocodone is an expected metabolite of hydrocodone .  Drug Absent but Declared for Prescription Verification   Hydrocodone                     Not Detected UNEXPECTED ng/mg creat    Hydrocodone  is almost always present in patients taking this drug    consistently. Absence of hydrocodone  could be due to lapse of time    since the last dose or unusual pharmacokinetics (rapid metabolism).  ==================================================================== Test                      Result    Flag   Units      Ref Range   Creatinine              52               mg/dL      >=79 ==================================================================== Declared Medications:  The flagging and interpretation on this report are based on the  following declared medications.   Unexpected results may arise from  inaccuracies in the declared medications.   **Note: The testing scope of this panel includes these medications:   Hydrocodone  (Norco)  Temazepam    **Note: The testing scope of this panel does not include the  following reported medications:   Acetaminophen  (Norco)  Atropine  (Lomotil )  Cyanocobalamin   Cyclobenzaprine   Diphenoxylate  (Lomotil )  Cowan  (Flonase )  Kimberly Cowan  (Lasix )  Kimberly Cowan  (Neurontin )  Latanoprost   Multivitamin  Pantoprazole  (Protonix )  Potassium (Klor-Con )  Kimberly Cowan  ==================================================================== For clinical consultation, please call [phone]. ====================================================================     No results found for: CBDTHCR No results found for: D8THCCBX No results found for: D9THCCBX  ROS  Constitutional: Denies any fever or chills Gastrointestinal: No reported hemesis, hematochezia, vomiting,  or acute GI distress Musculoskeletal: Denies any acute onset joint swelling, redness, loss of ROM, or weakness Neurological: No reported episodes of acute onset apraxia, aphasia, dysarthria, agnosia, amnesia, paralysis, loss of coordination, or loss of consciousness  Medication Review  HYDROcodone -acetaminophen , NON FORMULARY, Vitamin B-Complex, cyanocobalamin , cyclobenzaprine , diclofenac, diphenoxylate -atropine , Cowan , Kimberly Cowan , Kimberly Cowan , latanoprost , neomycin -polymyxin-dexamethasone , pantoprazole , potassium chloride , Kimberly Cowan , Kimberly Cowan , and temazepam   History Review  Allergy: Kimberly Cowan is allergic to morphine  and codeine , penicillins, latex, and baclofen . Drug: Kimberly Cowan  reports no history of drug use. Alcohol:  reports no history of alcohol use. Tobacco:  reports that she has never smoked. She has never used smokeless tobacco. Social: Kimberly Cowan  reports that she has never smoked. She has never used smokeless  tobacco. She reports that she does not drink alcohol and does not use drugs. Medical:  has a past medical history of Acute respiratory failure with hypoxia (HCC) (12/31/2022), Allergy (1999), Anemia, Anxiety, Arm pain, left (05/17/2018), Arthritis, Bilateral lumbar radiculopathy, Cellulitis of abdominal wall (03/03/2021), Chronic back pain, Chronic GERD (03/03/2021), Clinical depression (04/04/2012), Depression, DVT (deep venous thrombosis) (HCC), Encounter for screening colonoscopy, Essential (primary) hypertension (06/04/2020), Glaucoma, Headache(784.0), Major depressive disorder with single episode, in full remission (08/15/2018), Malfunction of jejunostomy tube (HCC) (02/13/2021), Ovarian cyst, Pneumonia, Pneumonia of left lower lobe due to infectious organism (12/07/2022), Pulmonary embolism (HCC) (05/2017), S/P laparoscopic cholecystectomy (02/07/2021), Severe sepsis (HCC) (12/07/2022), Sleep apnea, and Vasomotor symptoms due to menopause (11/11/2016). Surgical: Kimberly Cowan  has a past surgical history that includes Tonsillectomy; Gastric bypass (2005); Reduction mammaplasty (Bilateral); Spinal cord stimulator insertion (N/A, 03/20/2016); Abdominal hysterectomy (10/2006); Laparoscopic salpingo oophorectomy (Bilateral, 10/05/2016); Back surgery (2014); Back surgery (2009); Colonoscopy with propofol  (N/A, 04/12/2018); Cesarean section (1989 1995); Spine surgery (2009 2014 2017); Knee arthroscopy with lateral menisectomy (Left, 05/27/2017); Knee arthroscopy with lateral release (Left, 05/27/2017); Bariatric Surgery (07/2020); Cholecystectomy (01/2021); and Spinal cord stimulator battery exchange (Left, 06/01/2024). Family: family history includes Arthritis in her father, mother, sister, and sister; Cancer in her father and maternal aunt; Diabetes in her maternal aunt, maternal grandmother, mother, and sister; Hypertension in her father, mother, and sister; Obesity in her daughter, father, mother, sister, and  sister; Varicose Veins in her mother.  Laboratory Chemistry Profile   Renal Lab Results  Component Value Date   BUN 11 05/19/2024   CREATININE 0.74 05/19/2024   BCR 12 02/11/2024   GFRAA 100 07/24/2020   GFRNONAA >60 05/19/2024    Hepatic Lab Results  Component Value Date   AST 43 (H) 02/11/2024   ALT 54 (H) 02/11/2024   ALBUMIN  3.8 02/11/2024   ALKPHOS 119 02/11/2024   LIPASE 25 12/07/2022    Electrolytes Lab Results  Component Value Date   NA 142 05/19/2024   K 3.7 05/19/2024   CL 107 05/19/2024   CALCIUM 9.2 05/19/2024   MG 2.0 12/07/2022   PHOS 2.8 12/08/2022    Bone Lab Results  Component Value Date   VD25OH 9 02/19/2022    Inflammation (CRP: Acute Phase) (ESR: Chronic Phase) Lab Results  Component Value Date   LATICACIDVEN 1.6 12/08/2022         Note: Above Lab results reviewed.  Recent Imaging Review  MM 3D SCREENING MAMMOGRAM BILATERAL BREAST CLINICAL DATA:  Screening.  EXAM: DIGITAL SCREENING BILATERAL MAMMOGRAM WITH TOMOSYNTHESIS AND CAD  TECHNIQUE: Bilateral screening digital craniocaudal and mediolateral oblique mammograms were obtained. Bilateral screening digital breast tomosynthesis was performed. The images were evaluated with computer-aided detection.  COMPARISON:  Previous exam(s).  ACR  Breast Density Category b: There are scattered areas of fibroglandular density.  FINDINGS: There are no findings suspicious for malignancy.  IMPRESSION: No mammographic evidence of malignancy. A result letter of this screening mammogram will be mailed directly to the patient.  RECOMMENDATION: Screening mammogram in one year. (Code:[id number]-B-01Y)  BI-RADS CATEGORY  1: Negative.  Electronically Signed   By: Corean Salter M.D.   On: 03/02/2024 09:37 Note: Reviewed        Physical Exam  Vitals: BP (!) 147/96   Pulse 85   Temp 98 F (36.7 C)   Ht 5' 7 (1.702 m)   Wt 213 lb (96.6 kg)   SpO2 98%   BMI 33.36 kg/m  BMI: Estimated body  mass index is 33.36 kg/m as calculated from the following:   Height as of this encounter: 5' 7 (1.702 m).   Weight as of this encounter: 213 lb (96.6 kg). Ideal: Ideal body weight: 61.6 kg (135 lb 12.9 oz) Adjusted ideal body weight: 75.6 kg (166 lb 10.9 oz) General appearance: Well nourished, well developed, and well hydrated. In no apparent acute distress Mental status: Alert, oriented x 3 (person, place, & time)       Respiratory: No evidence of acute respiratory distress Eyes: PERLA   Assessment   Diagnosis Status  1. Failed back surgical syndrome   2. History of lumbar fusion   3. Encounter for long-term opiate analgesic use   4. Chronic pain syndrome   5. Chronic radicular lumbar pain    Controlled Controlled Controlled   Updated Problems: No problems updated.  Plan of Care  Problem-specific:  Assessment and Plan We will continue on current medication regimen.  Prescribing drug monitoring (PDMP) reviewed; findings consistent with the use of prescribed medication and no evidence of narcotic misuse or abuse.  No side effects or adverse reaction reported with this medication.  No other issues or problems reported at this visit.  Urine drug screening (UDS) up-to-date.  Schedule follow-up in 90 days for medication management with Emmy Blanch, NP.   Kimberly Cowan , Kimberly Cowan , Kimberly Cowan , Kimberly Cowan , Kimberly Cowan , and temazepam .  Pharmacotherapy (Medications Ordered): Meds ordered this encounter  Medications   HYDROcodone -acetaminophen  (NORCO) 10-325 MG tablet    Sig: Take 1 tablet by mouth every 8 (eight) hours as needed.    Dispense:  90 tablet    Refill:  0   Orders:  No orders of the defined types were placed in this encounter.       Return in about 3 months (around 09/19/2024) for (F2F), (MM), Emmy Blanch NP.    Recent Visits Date Type Provider  Dept  03/28/24 Office Visit Marcelino Nurse, MD [facility]  Showing recent visits within past 90 days and meeting all other requirements Today's Visits Date Type Provider Dept  06/20/24 Office Visit Jamesrobert Ohanesian K, NP [facility]  Showing today's visits and meeting all other requirements Future Appointments Date Type Provider Dept  09/14/24 Appointment Annaelle Kasel K, NP [facility]  Showing future appointments within next 90 days and meeting all other requirements  I discussed the assessment and treatment plan with the patient. The patient was provided an opportunity to ask questions and all were answered. The patient agreed with the plan and demonstrated an understanding of the instructions.  Patient advised to call back or seek an in-person evaluation if the symptoms or condition worsens. I personally spent a total  of 30 minutes in the care of the patient today including preparing to see the patient, getting/reviewing separately obtained history, performing a medically appropriate exam/evaluation, counseling and educating, placing orders, documenting clinical information in the EHR, independently interpreting results, communicating results, and coordinating care.   Duration of encounter:  minutes.  Total time on encounter, as per AMA guidelines included both the face-to-face and non-face-to-face time personally spent by the physician and/or other qualified health care professional(s) on the day of the encounter (includes time in activities that require the physician or other qualified health care professional and does not include time in activities normally performed by clinical staff). Physician's time may include the following activities when performed: Preparing to see the patient (e.g., pre-charting review of records, searching for previously ordered imaging, lab work, and nerve conduction tests) Review of prior analgesic pharmacotherapies. Reviewing  PMP Interpreting ordered tests (e.g., lab work, imaging, nerve conduction tests) Performing post-procedure evaluations, including interpretation of diagnostic procedures Obtaining and/or reviewing separately obtained history Performing a medically appropriate examination and/or evaluation Counseling and educating the patient/family/caregiver Ordering medications, tests, or procedures Referring and communicating with other health care professionals (when not separately reported) Documenting clinical information in the electronic or other health record Independently interpreting results (not separately reported) and communicating results to the patient/ family/caregiver Care coordination (not separately reported)  Note by: Emmy MARLA Blanch, NP  Date: 06/20/2024; Time: 3:16 PM

## 2024-06-22 ENCOUNTER — Encounter: Admitting: Student in an Organized Health Care Education/Training Program

## 2024-07-10 ENCOUNTER — Encounter: Admitting: Neurosurgery

## 2024-07-19 ENCOUNTER — Encounter: Payer: Self-pay | Admitting: Neurosurgery

## 2024-07-19 ENCOUNTER — Ambulatory Visit (INDEPENDENT_AMBULATORY_CARE_PROVIDER_SITE_OTHER): Admitting: Neurosurgery

## 2024-07-19 VITALS — BP 116/62 | Temp 99.2°F | Ht 67.0 in | Wt 213.1 lb

## 2024-07-19 DIAGNOSIS — Z9689 Presence of other specified functional implants: Secondary | ICD-10-CM

## 2024-07-19 DIAGNOSIS — G894 Chronic pain syndrome: Secondary | ICD-10-CM

## 2024-07-19 NOTE — Progress Notes (Signed)
   DOS: 06/01/24  HISTORY OF PRESENT ILLNESS: 07/19/2024 [name] is status post spinal cord stimulator battery replacement overall she is doing well..  She states that her pain is almost completely resolved while she is getting stimulation and she has been doing very well with her titrations.  PHYSICAL EXAMINATION:   Vitals:   07/19/24 1425  BP: 116/62  Temp: 99.2 F (37.3 C)   General: Patient is well developed, well nourished, calm, collected, and in no apparent distress.  NEUROLOGICAL:  General: In no acute distress.  Awake, alert, oriented to person, place, and time. Pupils equal round and reactive to light.   Strength: Ambulating unassisted  Incision is well-healed  ROS (Neurologic): Negative except as noted above  IMAGING: No interval imaging to review   ASSESSMENT/PLAN:  Kimberly Cowan is doing well after her spinal cord stimulator battery replacement.  Her programming has been going very well.  Overall she is very happy with her symptom control.  Her battery has not migrated.  She can continue to follow-up as needed.  Kimberly MICAEL Sharps, MD/MS Department of Neurosurgery

## 2024-08-03 ENCOUNTER — Encounter: Payer: Self-pay | Admitting: Internal Medicine

## 2024-08-06 ENCOUNTER — Other Ambulatory Visit: Payer: Self-pay | Admitting: Internal Medicine

## 2024-08-07 ENCOUNTER — Other Ambulatory Visit: Payer: Self-pay | Admitting: Internal Medicine

## 2024-08-07 DIAGNOSIS — F5101 Primary insomnia: Secondary | ICD-10-CM

## 2024-08-08 NOTE — Telephone Encounter (Signed)
 Requested medications are due for refill today.  Unsure  Requested medications are on the active medications list.  yes  Last refill. 04/19/2024  Future visit scheduled.   yes  Notes to clinic.  Medication is historical    Requested Prescriptions  Pending Prescriptions Disp Refills   potassium chloride  (KLOR-CON  M) 10 MEQ tablet [Pharmacy Med Name: POTASSIUM CL MICRO ER TABS] 180 tablet     Sig: TAKE 1 TABLET BY MOUTH TWICE DAILY     Endocrinology:  Minerals - Potassium Supplementation Passed - 08/08/2024 11:24 AM      Passed - K in normal range and within 360 days    Potassium  Date Value Ref Range Status  05/19/2024 3.7 3.5 - 5.1 mmol/L Final  10/31/2014 3.8 3.5 - 5.1 mmol/L Final         Passed - Cr in normal range and within 360 days    Creatinine  Date Value Ref Range Status  10/31/2014 0.69 0.60 - 1.30 mg/dL Final   Creatinine, Ser  Date Value Ref Range Status  05/19/2024 0.74 0.44 - 1.00 mg/dL Final         Passed - Valid encounter within last 12 months    Recent Outpatient Visits           2 months ago Prediabetes   [facility], Leita DEL, MD   5 months ago Primary hypertension   [facility], Leita DEL, MD       Future Appointments             In 1 week Justus, Leita DEL, MD [facility] at [facility], [phone] [place]

## 2024-08-09 NOTE — Telephone Encounter (Signed)
 Requested medication (s) are due for refill today: Yes  Requested medication (s) are on the active medication list: Yes  Last refill:  05/31/24  Future visit scheduled: Yes  Notes to clinic:  Unable to refill per protocol, cannot delegate.      Requested Prescriptions  Pending Prescriptions Disp Refills   temazepam  (RESTORIL ) 15 MG capsule [Pharmacy Med Name: TEMAZEPAM  15MG  CAPSULES] 30 capsule     Sig: TAKE 1 CAPSULE(15 MG) BY MOUTH AT BEDTIME AS NEEDED FOR SLEEP     Not Delegated - Psychiatry: Anxiolytics/Hypnotics 2 Failed - 08/09/2024  4:29 PM      Failed - This refill cannot be delegated      Failed - Urine Drug Screen completed in last 360 days      Passed - Patient is not pregnant      Passed - Valid encounter within last 6 months    Recent Outpatient Visits           2 months ago Prediabetes   [place] [facility], Leita DEL, MD   6 months ago Primary hypertension   [place] [facility] at [facility], Leita DEL, MD       Future Appointments             In 6 days Justus Leita DEL, MD [facility] at [facility], [phone] [place]

## 2024-08-09 NOTE — Telephone Encounter (Signed)
 Please review.  KP

## 2024-08-11 ENCOUNTER — Encounter: Payer: Self-pay | Admitting: Internal Medicine

## 2024-08-11 NOTE — Telephone Encounter (Signed)
 Please review.  KP

## 2024-08-15 ENCOUNTER — Ambulatory Visit (INDEPENDENT_AMBULATORY_CARE_PROVIDER_SITE_OTHER): Admitting: Internal Medicine

## 2024-08-15 ENCOUNTER — Encounter: Payer: Self-pay | Admitting: Internal Medicine

## 2024-08-15 VITALS — BP 122/74 | HR 88 | Ht 67.0 in | Wt 215.0 lb

## 2024-08-15 DIAGNOSIS — I10 Essential (primary) hypertension: Secondary | ICD-10-CM | POA: Diagnosis not present

## 2024-08-15 DIAGNOSIS — Z13220 Encounter for screening for lipoid disorders: Secondary | ICD-10-CM

## 2024-08-15 DIAGNOSIS — Z0000 Encounter for general adult medical examination without abnormal findings: Secondary | ICD-10-CM

## 2024-08-15 DIAGNOSIS — G4733 Obstructive sleep apnea (adult) (pediatric): Secondary | ICD-10-CM

## 2024-08-15 DIAGNOSIS — K219 Gastro-esophageal reflux disease without esophagitis: Secondary | ICD-10-CM | POA: Diagnosis not present

## 2024-08-15 DIAGNOSIS — G43909 Migraine, unspecified, not intractable, without status migrainosus: Secondary | ICD-10-CM | POA: Diagnosis not present

## 2024-08-15 DIAGNOSIS — Z1159 Encounter for screening for other viral diseases: Secondary | ICD-10-CM | POA: Diagnosis not present

## 2024-08-15 DIAGNOSIS — E559 Vitamin D deficiency, unspecified: Secondary | ICD-10-CM | POA: Diagnosis not present

## 2024-08-15 DIAGNOSIS — R7303 Prediabetes: Secondary | ICD-10-CM | POA: Diagnosis not present

## 2024-08-15 DIAGNOSIS — E538 Deficiency of other specified B group vitamins: Secondary | ICD-10-CM | POA: Diagnosis not present

## 2024-08-15 DIAGNOSIS — Z9884 Bariatric surgery status: Secondary | ICD-10-CM | POA: Diagnosis not present

## 2024-08-15 NOTE — Assessment & Plan Note (Signed)
On daily supplements

## 2024-08-15 NOTE — Assessment & Plan Note (Signed)
 Continue monthly self injections

## 2024-08-15 NOTE — Assessment & Plan Note (Signed)
 Gastric bypass in 2005 but regained weight up to 369 lbs. Underwent revision in 2021; current weight 213 lbs. Seen recently by bariatrics for diarrhea - referred her back to GI at [facility]

## 2024-08-15 NOTE — Assessment & Plan Note (Signed)
 Well controlled blood pressure today. Current regimen is furosemide . No medication side effects noted.

## 2024-08-15 NOTE — Progress Notes (Addendum)
 Date:  08/15/2024   Name:  Kimberly Cowan   DOB:  1966/07/28   MRN:  [id number]   Chief Complaint: Annual Exam Kimberly Cowan is a 58 y.o. female who presents today for her Complete Annual Exam. She feels well. She reports exercising. She reports she is sleeping well. Breast complaints - none.  Health Maintenance  Topic Date Due   Hepatitis C Screening  Never done   Hepatitis B Vaccine (1 of 3 - 19+ 3-dose series) Never done   Pneumococcal Vaccine for age over 9 (2 of 2 - PCV) 11/14/2023   DTaP/Tdap/Td vaccine (4 - Td or Tdap) 04/24/2024   COVID-19 Vaccine (6 - 2025-26 season) 05/29/2024   Flu Shot  12/26/2024*   Medicare Annual Wellness Visit  11/30/2024   Breast Cancer Screening  02/24/2025   Colon Cancer Screening  04/12/2028   HIV Screening  Completed   Zoster (Shingles) Vaccine  Completed   HPV Vaccine  Aged Out   Meningitis B Vaccine  Aged Out  *Topic was postponed. The date shown is not the original due date.    Hypertension This is a chronic problem. The problem is controlled. Pertinent negatives include no chest pain, headaches, palpitations or shortness of breath. Past treatments include diuretics. The current treatment provides significant improvement. There is no history of kidney disease, CAD/MI or CVA.  Gastroesophageal Reflux She complains of heartburn. She reports no abdominal pain, no chest pain, no coughing or no wheezing. This is a recurrent problem. The problem occurs occasionally. Pertinent negatives include no fatigue. She has tried a PPI for the symptoms.  Sleep apnea - last sleep study mod-severe apnea in 2017.  She has an old machine that is at a set setting.  Her mask does not fit and she feels claustrophobic.  Last study in 2017 before her last gastric surgery.    08/15/2024    8:21 AM 10/05/2022    8:12 AM  Results of the Epworth flowsheet  Sitting and reading 2 0  Watching TV 3 0  Sitting, inactive in a public place  (e.g. a theatre or a meeting) 0 0  As a passenger in a car for an hour without a break 1 0  Lying down to rest in the afternoon when circumstances permit 3 0  Sitting and talking to someone 0 0  Sitting quietly after a lunch without alcohol 2 0  In a car, while stopped for a few minutes in traffic 0 0  Total score 11 0    Review of Systems  Constitutional:  Negative for fatigue and unexpected weight change.  HENT:  Negative for trouble swallowing.   Eyes:  Negative for visual disturbance.  Respiratory:  Negative for cough, chest tightness, shortness of breath and wheezing.   Cardiovascular:  Negative for chest pain, palpitations and leg swelling.  Gastrointestinal:  Positive for heartburn. Negative for abdominal pain, constipation and diarrhea.  Musculoskeletal:  Negative for arthralgias and myalgias.  Neurological:  Negative for dizziness, weakness, light-headedness and headaches.     Lab Results  Component Value Date   NA 142 05/19/2024   K 3.7 05/19/2024   CO2 26 05/19/2024   GLUCOSE 87 05/19/2024   BUN 11 05/19/2024   CREATININE 0.74 05/19/2024   CALCIUM 9.2 05/19/2024   EGFR 80 02/11/2024   GFRNONAA >60 05/19/2024   Lab Results  Component Value Date   CHOL 135 02/19/2022   HDL 65 02/19/2022   LDLCALC 55 02/19/2022  TRIG 74 02/19/2022   CHOLHDL 1.8 10/02/2021   Lab Results  Component Value Date   TSH 1.330 02/11/2024   Lab Results  Component Value Date   HGBA1C 5.8 04/24/2023   Lab Results  Component Value Date   WBC 3.6 (L) 05/19/2024   HGB 10.9 (L) 05/19/2024   HCT 35.9 (L) 05/19/2024   MCV 94.7 05/19/2024   PLT 193 05/19/2024   Lab Results  Component Value Date   ALT 54 (H) 02/11/2024   AST 43 (H) 02/11/2024   ALKPHOS 119 02/11/2024   BILITOT 0.4 02/11/2024   Lab Results  Component Value Date   VD25OH 9 02/19/2022     Patient Active Problem List   Diagnosis Date Noted   Battery end of life of spinal cord stimulator 04/19/2024   BMI  33.0-33.9,adult 12/07/2022   Primary hypertension 12/07/2022   Generalized anxiety disorder 10/05/2022   Pain management contract signed 05/07/2022   Spinal cord stimulator status [employer], implanted 2017) 04/28/2022   History of lumbar fusion (L5-S1 PSIF) 04/28/2022   Encounter for long-term opiate analgesic use 04/28/2022   Chronic pain syndrome 04/28/2022   High serum parathyroid hormone (PTH) 02/25/2022   Vitamin A  deficiency 02/25/2022   Vitamin D  deficiency 02/25/2022   Exocrine pancreatic insufficiency 02/19/2022   Degenerative spondylolisthesis 12/19/2021   Chronic GERD 03/03/2021   Status post hysterectomy 11/05/2020   Bariatric surgery status 01/01/2020   Primary insomnia 12/28/2019   Failed back surgical syndrome 11/27/2019   Mild peripheral edema 11/15/2017   Perforated ear drum, left 06/25/2017   History of pulmonary embolism 06/10/2017   Environmental and seasonal allergies 04/07/2017   Tinea versicolor 02/03/2017   Glaucoma 09/28/2016   B12 nutritional deficiency 08/05/2016   OSA on CPAP 03/04/2016   Chronic radicular lumbar pain 08/14/2015   Prediabetes 07/01/2015   Spinal stenosis of lumbar region 12/10/2014   Headache, migraine 04/04/2012    Allergies  Allergen Reactions   Morphine  And Codeine  Hives, Shortness Of Breath, Nausea And Vomiting and Other (See Comments)    can't breathe   Penicillins Anaphylaxis, Hives, Nausea And Vomiting, Shortness Of Breath and Rash    can't breathe, Has patient had a PCN reaction causing immediate rash, facial/tongue/throat swelling, SOB or lightheadedness with hypotension: Yes  Has patient had a PCN reaction causing severe rash involving mucus membranes or skin necrosis: No  Has patient had a PCN reaction that required hospitalization No  Has patient had a PCN reaction occurring within the last 10 years: No  If all of the above answers are NO, then may proceed with Cephalosporin use.  Other Reaction(s):  wheezing   Latex Hives   Baclofen  Nausea Only    Jittery, anxious    Past Surgical History:  Procedure Laterality Date   ABDOMINAL HYSTERECTOMY  10/2006   BACK SURGERY  2014   lumbar fusion   BACK SURGERY  2009   discectomy   BARIATRIC SURGERY  07/2020   Revision   CESAREAN SECTION  1989 1995   CHOLECYSTECTOMY  01/2021   COLONOSCOPY WITH PROPOFOL  N/A 04/12/2018   Procedure: COLONOSCOPY WITH PROPOFOL ;  Surgeon: Unk Corinn Skiff, MD;  Location: [facility] END[facility]COPY;  Service: Gastroenterology;  Laterality: N/A;   GASTRIC BYPASS  2005   KNEE ARTHROSCOPY WITH LATERAL MENISECTOMY Left 05/27/2017   Procedure: KNEE ARTHROSCOPY WITH LATERAL MENISECTOMY;  Surgeon: Kathlynn Sharper, MD;  Location: [facility] ORS;  Service: Orthopedics;  Laterality: Left;   KNEE ARTHROSCOPY WITH LATERAL RELEASE Left 05/27/2017  Procedure: KNEE ARTHROSCOPY WITH LATERAL RELEASE;  Surgeon: Kathlynn Sharper, MD;  Location: [facility] ORS;  Service: Orthopedics;  Laterality: Left;   LAPAROSCOPIC SALPINGO OOPHERECTOMY Bilateral 10/05/2016   Procedure: LAPAROSCOPIC SALPINGO OOPHORECTOMY;  Surgeon: Gladis DELENA Dollar, MD;  Location: [facility] ORS;  Service: Gynecology;  Laterality: Bilateral;   REDUCTION MAMMAPLASTY Bilateral    2001   SPINAL CORD STIMULATOR BATTERY EXCHANGE Left 06/01/2024   Procedure: SPINAL CORD STIMULATOR BATTERY EXCHANGE;  Surgeon: Claudene Penne ORN, MD;  Location: [facility] ORS;  Service: Neurosurgery;  Laterality: Left;  Removal and replacement of Internal Pulse Generator (IPG)   SPINAL CORD STIMULATOR INSERTION N/A 03/20/2016   Procedure: LUMBAR SPINAL CORD STIMULATOR INSERTION;  Surgeon: Alm GORMAN Molt, MD;  Location: [facility]UR[facility] OR[facility];  Service: Neurosurgery;  Laterality: N/A;   SPINE SURGERY  2009 2014 2017   TONSILLECTOMY      Social History   Tobacco Use   Smoking status: Never   Smokeless tobacco: Never   Tobacco comments:    smoking cessation materials not required  Vaping Use   Vaping status: Every Day    Substances: Nicotine  Substance Use Topics   Alcohol use: No   Drug use: No     Medication list has been reviewed and updated.  Current Meds  Medication Sig   B Complex Vitamins (VITAMIN B-COMPLEX) TABS Take 1 tablet by mouth every morning.   cyanocobalamin  (VITAMIN B12) 1000 MCG/ML injection Inject 1 mL (1,000 mcg total) into the muscle every 21 ( twenty-one) days.   cyclobenzaprine  (FLEXERIL ) 10 MG tablet Take 1 tablet (10 mg total) by mouth daily as needed for muscle spasms. (Patient taking differently: Take 10 mg by mouth at bedtime.)   diclofenac (VOLTAREN) 75 MG EC tablet Take 75 mg by mouth 2 (two) times daily.   diphenoxylate -atropine  (LOMOTIL ) 2.5-0.025 MG tablet Take 2 tablets by mouth in the morning and at bedtime.   fluticasone  (FLONASE ) 50 MCG/ACT nasal spray PLACE 1 SPRAY IN EACH NOSTRIL DAILY AS NEEDED FOR ALLERGIES   furosemide  (LASIX ) 20 MG tablet TAKE 1 TABLET BY MOUTH DAILY AS NEEDED   gabapentin  (NEURONTIN ) 300 MG capsule Take 2 capsules (600 mg total) by mouth at bedtime.   latanoprost  (XALATAN ) 0.005 % ophthalmic solution Place 1 drop into both eyes at bedtime.    NON FORMULARY CPAP at night   pantoprazole  (PROTONIX ) 40 MG tablet Take 40 mg by mouth 2 (two) times daily.   potassium chloride  (KLOR-CON  M) 10 MEQ tablet TAKE 1 TABLET BY MOUTH TWICE DAILY   rizatriptan  (MAXALT -MLT) 10 MG disintegrating tablet DISSOLVE 1 TABLET BY MOUTH AS NEEDED FOR MIGRAINE. MAY REPEAT IN 2 HOURS AS NEEDED   sertraline  (ZOLOFT ) 100 MG tablet TAKE 1 TABLET(100 MG) BY MOUTH DAILY   temazepam  (RESTORIL ) 15 MG capsule TAKE 1 CAPSULE(15 MG) BY MOUTH AT BEDTIME AS NEEDED FOR SLEEP       08/15/2024    7:59 AM 05/17/2024    3:29 PM 02/11/2024    4:15 PM 11/06/2022    3:26 PM  GAD 7 : Generalized Anxiety Score  Nervous, Anxious, on Edge 0 0 0 2  Control/stop worrying 0 0 0 1  Worry too much - different things 0 0 0 2  Trouble relaxing 0 0 1 3  Restless 0 0 1 2  Easily annoyed or  irritable 0 0 0 1  Afraid - awful might happen 0 0 1 2  Total GAD 7 Score 0 0 3 13  Anxiety Difficulty Not  difficult at all Not difficult at all Somewhat difficult Somewhat difficult       08/15/2024    7:59 AM 05/17/2024    3:29 PM 02/11/2024    4:15 PM  Depression screen PHQ 2/9  Decreased Interest 0 0 0  Down, Depressed, Hopeless 0 0 0  PHQ - 2 Score 0 0 0  Altered sleeping 0 0 1  Tired, decreased energy 0 0 0  Change in appetite 0 0 0  Feeling bad or failure about yourself  0 0 0  Trouble concentrating 0 0 0  Moving slowly or fidgety/restless 0 0 0  Suicidal thoughts 0 0 0  PHQ-9 Score 0 0  1   Difficult doing work/chores Not difficult at all Not difficult at all Not difficult at all     Data saved with a previous flowsheet row definition    BP Readings from Last 3 Encounters:  08/15/24 122/74  07/19/24 116/62  06/20/24 (!) 147/96    Physical Exam Vitals and nursing note reviewed.  Constitutional:      General: She is not in acute distress.    Appearance: She is well-developed.  HENT:     Head: Normocephalic and atraumatic.     Right Ear: Tympanic membrane and ear canal normal.     Left Ear: Tympanic membrane and ear canal normal.     Nose:     Right Sinus: No maxillary sinus tenderness.     Left Sinus: No maxillary sinus tenderness.  Eyes:     General: No scleral icterus.       Right eye: No discharge.        Left eye: No discharge.     Conjunctiva/sclera: Conjunctivae normal.  Neck:     Thyroid : No thyromegaly.     Vascular: No carotid bruit.  Cardiovascular:     Rate and Rhythm: Normal rate and regular rhythm.     Pulses: Normal pulses.     Heart sounds: Normal heart sounds.  Pulmonary:     Effort: Pulmonary effort is normal. No respiratory distress.     Breath sounds: No wheezing.  Abdominal:     General: Bowel sounds are normal.     Palpations: Abdomen is soft.     Tenderness: There is no abdominal tenderness.  Musculoskeletal:     Cervical  back: Normal range of motion. No erythema.     Right lower leg: No edema.     Left lower leg: No edema.  Lymphadenopathy:     Cervical: No cervical adenopathy.  Skin:    General: Skin is warm and dry.     Findings: No rash.  Neurological:     Mental Status: She is alert and oriented to person, place, and time.     Cranial Nerves: No cranial nerve deficit.     Sensory: No sensory deficit.     Deep Tendon Reflexes: Reflexes are normal and symmetric.  Psychiatric:        Attention and Perception: Attention normal.        Mood and Affect: Mood normal.     Wt Readings from Last 3 Encounters:  08/15/24 215 lb (97.5 kg)  07/19/24 213 lb 2 oz (96.7 kg)  06/20/24 213 lb (96.6 kg)    BP 122/74   Pulse 88   Ht 5' 7 (1.702 m)   Wt 215 lb (97.5 kg)   SpO2 98%   BMI 33.67 kg/m   Assessment and Plan:  Problem List Items Addressed  This Visit       Unprioritized   Headache, migraine (Chronic)   No recent change in migraine headache character or frequency. Headaches continue to respond well to current therapy with Maxalt . No change in regimen recommended at this time.         Prediabetes (Chronic)   Managed with diet.  Would benefit from Wegovy  due to weight and OSA      Relevant Orders   Hemoglobin A1c   OSA on CPAP (Chronic)   Will get a repeat study with Avelyis and then advise If mod- severe will need to treat and see if Ozempic  is covered      B12 nutritional deficiency (Chronic)   Continue monthly self injections      Relevant Orders   Vitamin B12   Chronic GERD (Chronic)   Currently taking PPI with minimal reflux symptoms. Patient denies red flag symptoms - no melena, weight loss, dysphagia. Will maintain current management.       Bariatric surgery status (Chronic)   Gastric bypass in 2005 but regained weight up to 369 lbs. Underwent revision in 2021; current weight 213 lbs. Seen recently by bariatrics for diarrhea - referred her back to GI at [facility]       Vitamin D  deficiency   On daily supplements      Relevant Orders   VITAMIN D  25 Hydroxy (Vit-D Deficiency, Fractures)   Primary hypertension (Chronic)   Well controlled blood pressure today. Current regimen is furosemide . No medication side effects noted.        Relevant Orders   CBC with Differential/Platelet   Comprehensive metabolic panel with GFR   TSH   Urinalysis, Routine w reflex microscopic   Other Visit Diagnoses       Annual physical exam    -  Primary   MM due June 2026 Colonoscopy due 2029     Screening for lipid disorders       Relevant Orders   Lipid panel     Need for hepatitis C screening test       Relevant Orders   Hepatitis C antibody       Return in about 2 months (around 10/15/2024) for OSA - Dr. Sol.    Leita HILARIO Adie, MD [facility] [place]

## 2024-08-15 NOTE — Assessment & Plan Note (Signed)
 No recent change in migraine headache character or frequency. Headaches continue to respond well to current therapy with Maxalt . No change in regimen recommended at this time.

## 2024-08-15 NOTE — Assessment & Plan Note (Signed)
 Managed with diet.  Would benefit from Wegovy  due to weight and OSA

## 2024-08-15 NOTE — Assessment & Plan Note (Signed)
 Will get a repeat study with Avelyis and then advise If mod- severe will need to treat and see if Ozempic  is covered

## 2024-08-15 NOTE — Patient Instructions (Signed)
 Kimberly Cowan,  We hope you enjoyed your visit with our office! Your feedback means so much to our team, and it helps us  to continue providing the best care possible. If you had a positive experience, we'd love if you could share it by leaving us  a [employer] Review and also completing our patient survey that you'll receive soon.  Your kind words not only brighten our day but also help other patients feel confident in choosing our office for their care.  Thank you for being a part of our practice family!   Dr. Leita Adie & Eual Grieves, CMA, [facility]  Primary Care & [facility] Medicine [facility] [place] [address]  [place] [address] Office [phone]  Fax: [phone]'

## 2024-08-15 NOTE — Assessment & Plan Note (Signed)
 Currently taking PPI with minimal reflux symptoms. Patient denies red flag symptoms - no melena, weight loss, dysphagia. Will maintain current management.

## 2024-08-16 ENCOUNTER — Ambulatory Visit: Payer: Self-pay | Admitting: Internal Medicine

## 2024-08-16 DIAGNOSIS — E559 Vitamin D deficiency, unspecified: Secondary | ICD-10-CM

## 2024-08-16 LAB — CBC WITH DIFFERENTIAL/PLATELET
Basophils Absolute: 0 x10E3/uL (ref 0.0–0.2)
Basos: 1 %
EOS (ABSOLUTE): 0.1 x10E3/uL (ref 0.0–0.4)
Eos: 3 %
Hematocrit: 35.1 % (ref 34.0–46.6)
Hemoglobin: 11.2 g/dL (ref 11.1–15.9)
Immature Grans (Abs): 0 x10E3/uL (ref 0.0–0.1)
Immature Granulocytes: 0 %
Lymphocytes Absolute: 1.9 x10E3/uL (ref 0.7–3.1)
Lymphs: 42 %
MCH: 29.6 pg (ref 26.6–33.0)
MCHC: 31.9 g/dL (ref 31.5–35.7)
MCV: 93 fL (ref 79–97)
Monocytes Absolute: 0.3 x10E3/uL (ref 0.1–0.9)
Monocytes: 7 %
Neutrophils Absolute: 2.1 x10E3/uL (ref 1.4–7.0)
Neutrophils: 47 %
Platelets: 235 x10E3/uL (ref 150–450)
RBC: 3.78 x10E6/uL (ref 3.77–5.28)
RDW: 12.8 % (ref 11.7–15.4)
WBC: 4.5 x10E3/uL (ref 3.4–10.8)

## 2024-08-16 LAB — COMPREHENSIVE METABOLIC PANEL WITH GFR
ALT: 36 IU/L — ABNORMAL HIGH (ref 0–32)
AST: 33 IU/L (ref 0–40)
Albumin: 3.8 g/dL (ref 3.8–4.9)
Alkaline Phosphatase: 136 IU/L — ABNORMAL HIGH (ref 49–135)
BUN/Creatinine Ratio: 15 (ref 9–23)
BUN: 12 mg/dL (ref 6–24)
Bilirubin Total: 0.6 mg/dL (ref 0.0–1.2)
CO2: 23 mmol/L (ref 20–29)
Calcium: 9.3 mg/dL (ref 8.7–10.2)
Chloride: 108 mmol/L — ABNORMAL HIGH (ref 96–106)
Creatinine, Ser: 0.78 mg/dL (ref 0.57–1.00)
Globulin, Total: 2.4 g/dL (ref 1.5–4.5)
Glucose: 89 mg/dL (ref 70–99)
Potassium: 3.6 mmol/L (ref 3.5–5.2)
Sodium: 144 mmol/L (ref 134–144)
Total Protein: 6.2 g/dL (ref 6.0–8.5)
eGFR: 89 mL/min/1.73 (ref >59)

## 2024-08-16 LAB — URINALYSIS, ROUTINE W REFLEX MICROSCOPIC
Bilirubin, UA: NEGATIVE
Glucose, UA: NEGATIVE
Ketones, UA: NEGATIVE
Leukocytes,UA: NEGATIVE
Nitrite, UA: NEGATIVE
Protein,UA: NEGATIVE
RBC, UA: NEGATIVE
Specific Gravity, UA: 1.014 (ref 1.005–1.030)
Urobilinogen, Ur: 1 mg/dL (ref 0.2–1.0)
pH, UA: 6.5 (ref 5.0–7.5)

## 2024-08-16 LAB — LIPID PANEL
Chol/HDL Ratio: 1.6 ratio (ref 0.0–4.4)
Cholesterol, Total: 130 mg/dL (ref 100–199)
HDL: 79 mg/dL (ref >39)
LDL Chol Calc (NIH): 40 mg/dL (ref 0–99)
Triglycerides: 48 mg/dL (ref 0–149)
VLDL Cholesterol Cal: 11 mg/dL (ref 5–40)

## 2024-08-16 LAB — VITAMIN B12: Vitamin B-12: 1340 pg/mL — ABNORMAL HIGH (ref 232–1245)

## 2024-08-16 LAB — VITAMIN D 25 HYDROXY (VIT D DEFICIENCY, FRACTURES): Vit D, 25-Hydroxy: 18 ng/mL — ABNORMAL LOW (ref 30.0–100.0)

## 2024-08-16 LAB — HEMOGLOBIN A1C
Est. average glucose Bld gHb Est-mCnc: 114 mg/dL
Hgb A1c MFr Bld: 5.6 % (ref 4.8–5.6)

## 2024-08-16 LAB — HEPATITIS C ANTIBODY: Hep C Virus Ab: NONREACTIVE

## 2024-08-16 LAB — TSH: TSH: 1.01 u[IU]/mL (ref 0.450–4.500)

## 2024-08-16 MED ORDER — VITAMIN D (ERGOCALCIFEROL) 1.25 MG (50000 UNIT) PO CAPS: 50000.0000 [IU] | ORAL_CAPSULE | ORAL | 3 refills | Status: AC

## 2024-09-09 ENCOUNTER — Other Ambulatory Visit: Payer: Self-pay | Admitting: Student in an Organized Health Care Education/Training Program

## 2024-09-09 DIAGNOSIS — G894 Chronic pain syndrome: Secondary | ICD-10-CM

## 2024-09-09 DIAGNOSIS — G8929 Other chronic pain: Secondary | ICD-10-CM

## 2024-09-09 DIAGNOSIS — M961 Postlaminectomy syndrome, not elsewhere classified: Secondary | ICD-10-CM

## 2024-09-14 ENCOUNTER — Encounter: Payer: Self-pay | Admitting: Nurse Practitioner

## 2024-09-14 ENCOUNTER — Ambulatory Visit: Attending: Nurse Practitioner | Admitting: Nurse Practitioner

## 2024-09-14 VITALS — BP 144/83 | HR 78 | Temp 97.2°F | Resp 18 | Ht 67.0 in | Wt 213.0 lb

## 2024-09-14 DIAGNOSIS — Z9689 Presence of other specified functional implants: Secondary | ICD-10-CM | POA: Insufficient documentation

## 2024-09-14 DIAGNOSIS — Z79891 Long term (current) use of opiate analgesic: Secondary | ICD-10-CM | POA: Diagnosis present

## 2024-09-14 DIAGNOSIS — Z981 Arthrodesis status: Secondary | ICD-10-CM | POA: Diagnosis present

## 2024-09-14 DIAGNOSIS — M961 Postlaminectomy syndrome, not elsewhere classified: Secondary | ICD-10-CM

## 2024-09-14 DIAGNOSIS — G894 Chronic pain syndrome: Secondary | ICD-10-CM | POA: Diagnosis present

## 2024-09-14 DIAGNOSIS — M5416 Radiculopathy, lumbar region: Secondary | ICD-10-CM | POA: Insufficient documentation

## 2024-09-14 DIAGNOSIS — G8929 Other chronic pain: Secondary | ICD-10-CM | POA: Diagnosis present

## 2024-09-14 DIAGNOSIS — M47816 Spondylosis without myelopathy or radiculopathy, lumbar region: Secondary | ICD-10-CM | POA: Insufficient documentation

## 2024-09-14 MED ORDER — GABAPENTIN 300 MG PO CAPS: 600.0000 mg | ORAL_CAPSULE | Freq: Every day | ORAL | 5 refills | Status: AC

## 2024-09-14 MED ORDER — KETOROLAC TROMETHAMINE 60 MG/2ML IM SOLN
60.0000 mg | Freq: Once | INTRAMUSCULAR | Status: AC
  Administered 2024-09-14: 14:00:00 60 mg via INTRAMUSCULAR
  Filled 2024-09-14: qty 2

## 2024-09-14 MED ORDER — METHOCARBAMOL 1000 MG/10ML IJ SOLN
200.0000 mg | Freq: Once | INTRAMUSCULAR | Status: AC
  Administered 2024-09-14: 14:00:00 200 mg via INTRAMUSCULAR
  Filled 2024-09-14: qty 10

## 2024-09-14 NOTE — Progress Notes (Signed)
 Nursing Pain Medication Assessment:  Safety precautions to be maintained throughout the outpatient stay will include: orient to surroundings, keep bed in low position, maintain call bell within reach at all times, provide assistance with transfer out of bed and ambulation.  Medication Inspection Compliance: Pill count conducted under aseptic conditions, in front of the patient. Neither the pills nor the bottle was removed from the patient's sight at any time. Once count was completed pills were immediately returned to the patient in their original bottle.  Medication: Hydrocodone /APAP Pill/Patch Count: 0 of 90 pills/patches remain Pill/Patch Appearance: Markings consistent with prescribed medication Bottle Appearance: Standard pharmacy container. Clearly labeled. Filled Date: 09 / 23 / 2025 Last Medication intake:  1 week ago

## 2024-09-14 NOTE — Patient Instructions (Addendum)
 Preparing for Procedure with Sedation Instructions: Oral Intake: Do not eat or drink anything for at least 8 hours prior to your procedure. Transportation: Public transportation is not allowed. Bring an adult driver. The driver must be physically present in our waiting room before any procedure can be started. Physical Assistance: Bring an adult capable of physically assisting you, in the event you need help. Blood Pressure Medicine: Take your blood pressure medicine with a sip of water the morning of the procedure. Insulin: Take only  of your normal insulin dose. Preventing infections: Shower with an antibacterial soap the morning of your procedure. Build-up your immune system: Take 1000 mg of Vitamin C with every meal (3 times a day) the day prior to your procedure. Pregnancy: If you are pregnant, call and cancel the procedure. Sickness: If you have a cold, fever, or any active infections, call and cancel the procedure. Arrival: You must be in the facility at least 30 minutes prior to your scheduled procedure. Children: Do not bring children with you. Dress appropriately: Bring dark clothing that you would not mind if they get stained. Valuables: Do not bring any jewelry or valuables. Procedure appointments are reserved for interventional treatments only. No Prescription Refills. No medication changes will be discussed during procedure appointments. No disability issues will be discussed.  ______________________________________________________________________    Opioid Pain Medication Update  To: All patients taking opioid pain medications. (I.e.: hydrocodone , hydromorphone , oxycodone , oxymorphone, morphine , codeine , methadone, tapentadol , tramadol , buprenorphine, fentanyl , etc.)  Re: Updated review of side effects and adverse reactions of opioid analgesics, as well as new information about long term effects of this class of medications.  Direct risks of long-term opioid therapy are not  limited to opioid addiction and overdose. Potential medical risks include serious fractures, breathing problems during sleep, hyperalgesia, immunosuppression, chronic constipation, bowel obstruction, myocardial infarction, and tooth decay secondary to xerostomia.  Unpredictable adverse effects that can occur even if you take your medication correctly: Cognitive impairment, respiratory depression, and death. Most people think that if they take their medication correctly, and as instructed, that they will be safe. Nothing could be farther from the truth. In reality, a significant amount of recorded deaths associated with the use of opioids has occurred in individuals that had taken the medication for a long time, and were taking their medication correctly. The following are examples of how this can happen: Patient taking his/her medication for a long time, as instructed, without any side effects, is given a certain antibiotic or another unrelated medication, which in turn triggers a Drug-to-drug interaction leading to disorientation, cognitive impairment, impaired reflexes, respiratory depression or an untoward event leading to serious bodily harm or injury, including death.  Patient taking his/her medication for a long time, as instructed, without any side effects, develops an acute impairment of liver and/or kidney function. This will lead to a rapid inability of the body to breakdown and eliminate their pain medication, which will result in effects similar to an overdose, but with the same medicine and dose that they had always taken. This again may lead to disorientation, cognitive impairment, impaired reflexes, respiratory depression or an untoward event leading to serious bodily harm or injury, including death.  A similar problem will occur with patients as they grow older and their liver and kidney function begins to decrease as part of the aging process.  Background information: Historically,  the original case for using long-term opioid therapy to treat chronic noncancer pain was based on safety assumptions that subsequent experience  has called into question. In 1996, the American Pain Society and the American Academy of Pain Medicine issued a consensus statement supporting long-term opioid therapy. This statement acknowledged the dangers of opioid prescribing but concluded that the risk for addiction was low; respiratory depression induced by opioids was short-lived, occurred mainly in opioid-naive patients, and was antagonized by pain; tolerance was not a common problem; and efforts to control diversion should not constrain opioid prescribing. This has now proven to be wrong. Experience regarding the risks for opioid addiction, misuse, and overdose in community practice has failed to support these assumptions.  According to the Centers for Disease Control and Prevention, fatal overdoses involving opioid analgesics have increased sharply over the past decade. Currently, more than 96,700 people die from drug overdoses every year. Opioids are a factor in 7 out of every 10 overdose deaths. Deaths from drug overdose have surpassed motor vehicle accidents as the leading cause of death for individuals between the ages of 59 and 35.  Clinical data suggest that neuroendocrine dysfunction may be very common in both men and women, potentially causing hypogonadism, erectile dysfunction, infertility, decreased libido, osteoporosis, and depression. Recent studies linked higher opioid dose to increased opioid-related mortality. Controlled observational studies reported that long-term opioid therapy may be associated with increased risk for cardiovascular events. Subsequent meta-analysis concluded that the safety of long-term opioid therapy in elderly patients has not been proven.   Side Effects and adverse reactions: Common side effects: Drowsiness (sedation). Dizziness. Nausea and  vomiting. Constipation. Physical dependence -- Dependence often manifests with withdrawal symptoms when opioids are discontinued or decreased. Tolerance -- As you take repeated doses of opioids, you require increased medication to experience the same effect of pain relief. Respiratory depression -- This can occur in healthy people, especially with higher doses. However, people with COPD, asthma or other lung conditions may be even more susceptible to fatal respiratory impairment.  Uncommon side effects: An increased sensitivity to feeling pain and extreme response to pain (hyperalgesia). Chronic use of opioids can lead to this. Delayed gastric emptying (the process by which the contents of your stomach are moved into your small intestine). Muscle rigidity. Immune system and hormonal dysfunction. Quick, involuntary muscle jerks (myoclonus). Arrhythmia. Itchy skin (pruritus). Dry mouth (xerostomia).  Long-term side effects: Chronic constipation. Sleep-disordered breathing (SDB). Increased risk of bone fractures. Hypothalamic-pituitary-adrenal dysregulation. Increased risk of overdose.  RISKS: Respiratory depression and death: Opioids increase the risk of respiratory depression and death.  Drug-to-drug interactions: Opioids are relatively contraindicated in combination with benzodiazepines, sleep inducers, and other central nervous system depressants. Other classes of medications (i.e.: certain antibiotics and even over-the-counter medications) may also trigger or induce respiratory depression in some patients.  Medical conditions: Patients with pre-existing respiratory problems are at higher risk of respiratory failure and/or depression when in combination with opioid analgesics. Opioids are relatively contraindicated in some medical conditions such as central sleep apnea.   Fractures and Falls:  Opioids increase the risk and incidence of falls. This is of particular importance in elderly  patients.  Endocrine System:  Long-term administration is associated with endocrine abnormalities (endocrinopathies). (Also known as Opioid-induced Endocrinopathy) Influences on both the hypothalamic-pituitary-adrenal axis?and the hypothalamic-pituitary-gonadal axis have been demonstrated with consequent hypogonadism and adrenal insufficiency in both sexes. Hypogonadism and decreased levels of dehydroepiandrosterone sulfate have been reported in men and women. Endocrine effects include: Amenorrhoea in women (abnormal absence of menstruation) Reduced libido in both sexes Decreased sexual function Erectile dysfunction in men Hypogonadisms (decreased testicular function with  shrinkage of testicles) Infertility Depression and fatigue Loss of muscle mass Anxiety Depression Immune suppression Hyperalgesia Weight gain Anemia Osteoporosis Patients (particularly women of childbearing age) should avoid opioids. There is insufficient evidence to recommend routine monitoring of asymptomatic patients taking opioids in the long-term for hormonal deficiencies.  Immune System: Human studies have demonstrated that opioids have an immunomodulating effect. These effects are mediated via opioid receptors both on immune effector cells and in the central nervous system. Opioids have been demonstrated to have adverse effects on antimicrobial response and anti-tumour surveillance. Buprenorphine has been demonstrated to have no impact on immune function.  Opioid Induced Hyperalgesia: Human studies have demonstrated that prolonged use of opioids can lead to a state of abnormal pain sensitivity, sometimes called opioid induced hyperalgesia (OIH). Opioid induced hyperalgesia is not usually seen in the absence of tolerance to opioid analgesia. Clinically, hyperalgesia may be diagnosed if the patient on long-term opioid therapy presents with increased pain. This might be qualitatively and anatomically distinct  from pain related to disease progression or to breakthrough pain resulting from development of opioid tolerance. Pain associated with hyperalgesia tends to be more diffuse than the pre-existing pain and less defined in quality. Management of opioid induced hyperalgesia requires opioid dose reduction.  Cancer: Chronic opioid therapy has been associated with an increased risk of cancer among noncancer patients with chronic pain. This association was more evident in chronic strong opioid users. Chronic opioid consumption causes significant pathological changes in the small intestine and colon. Epidemiological studies have found that there is a link between opium  dependence and initiation of gastrointestinal cancers. Cancer is the second leading cause of death after cardiovascular disease. Chronic use of opioids can cause multiple conditions such as GERD, immunosuppression and renal damage as well as carcinogenic effects, which are associated with the incidence of cancers.   Mortality: Long-term opioid use has been associated with increased mortality among patients with chronic non-cancer pain (CNCP).  Prescription of long-acting opioids for chronic noncancer pain was associated with a significantly increased risk of all-cause mortality, including deaths from causes other than overdose.  Reference: Von Korff M, Kolodny A, Deyo RA, Chou R. Long-term opioid therapy reconsidered. Ann Intern Med. 2011 Sep 6;155(5):325-8. doi: 10.7326/[id number]. PMID: 78106373; PMCID: [id number]. Kit JINNY Laurence CINDERELLA Pearley JINNY, Hayward RA, Dunn KM, Jordan KP. Risk of adverse events in patients prescribed long-term opioids: A cohort study in the [place] Clinical Practice Research [employer]. Eur J Pain. 2019 May;23(5):908-922. doi: 10.1002/ejp.1357. Epub 2019 Jan 31. PMID: 69379883. Colameco S, Coren JS, Ciervo CA. Continuous opioid treatment for chronic noncancer pain: a time for moderation in prescribing. Postgrad  Med. 2009 Jul;121(4):61-6. doi: 10.3810/pgm.2009.07.2032. PMID: 80358728. Gigi JONELLE Shlomo MILUS Levern IVER Conny RN, [place], Blazina I, Lonell DASEN, Bougatsos C, Deyo RA. The effectiveness and risks of long-term opioid therapy for chronic pain: a systematic review for a [employer] of [employer] Pathways to [employer]. Ann Intern Med. 2015 Feb 17;162(4):276-86. doi: 10.7326/M14-2559. PMID: 74418742. Rory CHRISTELLA Laurence [facility], [place] DM. NCHS Data Brief No. 22. [place]: Centers for Disease Control and Prevention; 2009. Sep, Increase in Fatal Poisonings Involving Opioid Analgesics in the [place] , 1999-2006. Song IA, Choi HR, Oh TK. Long-term opioid use and mortality in patients with chronic non-cancer pain: Ten-year follow-up study in [place] from 2010 through 2019. EClinicalMedicine. 2022 Jul 18;51:101558. doi: 10.1016/j.eclinm.2022.898441. PMID: 64124182; PMCID: EFR0695089. Huser, WSABRA [place], T., Vogelmann, T. et al. All-cause mortality in patients with long-term opioid therapy compared with non-opioid analgesics for chronic  non-cancer pain: a database study. BMC Med 18, 162 (2020). [email or web address].info/ Rashidian H, Zendehdel K, Kamangar F, Malekzadeh R, Haghdoost AA. An Ecological Study of the Association between Opiate Use and Incidence of Cancers. Addict Health. 2016 Fall;8(4):252-260. PMID: 71180443; PMCID: [id number].  Our Goal: Our goal is to control your pain with means other than the use of opioid pain medications.  Our Recommendation: Talk to your physician about coming off of these medications. We can assist you with the tapering down and stopping these medicines. Based on the new information, even if you cannot completely stop the medication, a decrease in the dose may be associated with a lesser risk. Ask for other means of controlling the pain. Decrease or eliminate those factors that significantly contribute to your pain such as smoking, obesity, and a  diet heavily tilted towards inflammatory nutrients.  Last Updated: 04/05/2023   ______________________________________________________________________       ______________________________________________________________________    Update on Controlled Substance (Opioid) Regulations   To: All patients taking opioid pain medications. (I.e.: hydrocodone , hydromorphone , oxycodone , oxymorphone, morphine , codeine , methadone, tapentadol , tramadol , buprenorphine, fentanyl , etc.)  Re: Review on the state of controlled substance regulations.  Introduction: Rules and regulations associated with all aspects of controlled substances are constantly being modified. Unfortunately we have encountered patients questioning the veracity of the information that we provide them about these changes. This is intended to provide them with appropriate references and a historical review of these changes.  A Brief History: As of July 13, 2016, the [place]  Government declared the opioid epidemic a public health emergency. Prescription drug monitoring programs (PDMPs) and the [facility] All Schedules Prescription Electronic Reporting Act (NASPER). Before 1800, clinicians regarded pain as an existential phenomenon, a consequence of aging. There was no regulation on the use of cocaine and opioids, resulting in widespread marketing and prescribing for many ailments ranging from diarrhea to toothache. The [employer] of [phone], passed in response to the sudden emergence of street heroin abuse as well as iatrogenic morphine  dependence, influenced both physician and patient alike to avoid opiates. Patients with unexplained pain in the 1920s were regarded as deluded, malingering, or abusers, and cancer patients through the 1950s were encouraged to wean themselves off opioids until their lives could be measured in weeks. Alongside this opioid evolution, the American Pain Society launched their influential pain as  the fifth vital sign campaign in 1995. Concurrently, pharmaceutical companies introduced new formulations, such as extended release oxycodone  (OxyContin ). From 1997 to 2002, OxyContin  prescriptions increased from 670,000 to 6.2 million. However, concerns soon began to surface regarding overzealous opioid treatment. It must be noted that pharmaceutical companies contributed significantly to the rise of the opioid epidemic, receiving considerable reprimands as a consequence. In 2007, as the opioid epidemic began to inflict profound damage, [employer] pleaded guilty to federal charges related to the misbranding of OxyContin . [employer] agreed to pay a total of $634.5 million to resolve Justice Department investigations, as well as a $19.5 million settlement to [address] and the [address] of [place].  In response to the current epidemic, changes in focus to the development of new abuse deterrent opioid formulations at the [place]  Food and Drug Administration (FDA) as well as drafting of new public standards for pain treatment were created at [facility] in 2017. In response to the opioid epidemic, FDA public policy changes were announced in February 2016. Among these new positions were a re-examination of the risk-benefit paradigm for opioids with strict emphasis on the large public health  ramifications. The various modified opioids released over the past 20 years, such as tamper-resistant preparation, have had differing levels of success, and are collectively referred to as Risk Evaluation and Mitigation Strategies (REMS). There is also a growing focus on preventing opioid use disorder (OUD) and on offering affected individuals accessible and effective treatment. [place]  government policy reflects these changes and both the Affordable Care Act and the Mental Health Parity and Addiction Equity Act were major steps forward in treating opioid addiction. The Affordable Care Act, which was signed into law in October 04, 2009, with major provisions  coming into effect by 10/04/2013.  In the 1990s, the intensified marketing of newly reformulated prescription opioid medications (e.g., OxyContin ) and an influential pain advocacy campaign that encouraged greater pain management led to a precipitous rise in opioid use in the [place] . Research from the Centers for Disease Control and Prevention (CDC) shows that prescription opioid sales in the [place]  quadrupled from Oct 04, 1998 to 10-04-2009. At the same time, opioid misuse and opioid-involved overdose deaths increased (Figure 1). Between Oct 04, 1998 and October 04, 2009, the rate of opioidinvolved overdose deaths in the [place]  doubled from 2.9 to 6.8 deaths per 100,000 people. This initial rise in opioid-related deaths is often referred to as the first wave of the recent opioid crisis.  Between 1998-10-04 and 10-05-2019, 565,000 Americans died of opioid-involved overdoses. In turn, federal, state, and local governments responded with various legal and policy efforts to curb opioid misuse and drug-related overdose Deaths.  Recent Congresses have enacted several laws addressing the opioid crisis, such as the Comprehensive Addiction and Recovery Act of October 05, 2015 (CARA, P.L. 114-198); the 04-Oct-2024 Century Cures Act (P.L. 114-255); the Substance UseDisorder Prevention that Promotes Opioid Recovery and Treatment for Patients and Communities Act (SUPPORT Act, P.L. [phone]); the Fentanyl  Sanctions Act (Title LXXII of P.L. [id number]); and the Blocking Deadly Fentanyl  Imports Act (P.L. 117-81, 6610). These laws addressed overprescribing and misuse of opioids, expanded substance use disorder prevention and treatment capacities, bolstered drug diversion capabilities, and enhanced international drug interdiction, counternarcotics cooperation, and sanctions efforts. Congress also directed additional funds to many of these initiatives through appropriations.  Congress provided funding in the [employer] Act of 10-04-2020 [phone];  P.L. 117-2) for syringe services programs (often known as needle exchange programs) and other harm reduction initiatives. Federal and state harm reduction strategies have frequently involved the distribution of naloxone (e.g., Narcan)--a medication used to reverse an opioid overdose--and test strips used to detect fentanyl  in drug samples.  The Department of Justice (DOJ) and Department of Homeland Security [facility]) aim to reduce the diversion of prescription opioids and the use, manufacturing, and trafficking of illicit opioids. DOJ--via the Drug Enforcement Administration (DEA)--regulates opioid manufacturers, distributors, and dispensers; it also controls the opioid supply through enforcement of regulatory requirements.  A History of Opiate Laws in the [place]   Prior to 1889-10[birth year]04, laws concerning opiates were strictly imposed on a local city or state-by-state basis. One of the first was in [place] in Oct 04, 1874 where it became illegal to smoke opium  only in opium  dens. It did not ban the sale, import or use otherwise. In the next 25 years different states enacted opium  laws ranging from outlawing opium  dens altogether to making possession of opium , morphine  and heroin without a physicians prescription illegal.  The first Congressional Act took place in 04-Oct-1889 that levied taxes on morphine  and opium . From that time on the [employer] has had a series of laws and acts directly aimed at opiate use,  abuse and control. These are outlined below:  1906 - Pure Food and Drug Act Preventing the manufacture, sale, or transportation of adulterated or misbranded or poisonous or deleterious foods, drugs, medicines, and liquors, and for regulating traffic therein, and for other purposes. Punishment included fines and prison time.  1909   - Smoking Opium  Exclusion Act Banned the importation, possession and use of smoking opium . Did not regulate opium -based medications. First Freight forwarder banning  the non-medical use of a substance.  1914  - The Margrette Act In summary, The Margrette Act of 1914 was written more to have all parties involved in importing, exporting, set designer and distributing opium  or cocaine to register with the [employer] and have taxes levied upon them. Exempt from the law were physicians operating in the course of his professional practice  1919 - Supreme Court ratified the Bj's in [place] et al., v. [place]  and [place]  v. [place], then again in [facility] v. [place] , in 1920, holding that doctors may not prescribe maintenance supplies of narcotics to people addicted to narcotics. However, it does not prohibit doctors from prescribing narcotics to wean a patient off of the drug. It was also the opinion of the court that prescribing narcotics to habitual users was not considered professional practice hence it then was considered illegal for doctors to prescribe opioids for the purposes of maintaining an addiction. It can be argued that todays addiction medications are not intended to maintain an addiction but to facilitate addiction remission. In which case, this opinion of the court should not preclude practitioners from prescribing buprenorphine or methadone to patients suffering from an addictive disorder.  1924  - Heroin Act Architectural technologist, importation and possession of heroin illegal - even for medicinal use.  1922 -- Narcotic Drug Import and Export Act Enacted to assure proper control of importation, sale, possession, production and consumption of narcotics.  1927  -- Special Educational Needs Teacher of [employer] of Pro[employer] was responsible for tracking bootleggers and organized conservation officer, historic buildings. They focused primarily on interstate and international cases and those cases where local law enforcement official would not or could not act.  1932 -- Uniform State Narcotic Act Encouraged states to pass uniform state laws  matching the federal Narcotic Drug Import and Export Act. Suggested prohibiting cannabis use at the state level.  66 -- Food, Drug, and Cosmetic Act The new law brought cosmetics and medical devices under control, and it required that drugs be labeled with adequate directions for safe use. Moreover, it mandated pre-market approval of all new drugs, such that a manufacturer would have to prove to FDA that a drug were safe before it could be sold  1951 -- Boggs Act Imposed maximum criminal penalties for violations of the import/export and internal revenue laws related to drugs and also established mandatory minimum prison sentences.  1956 -- Narcotics Control Act Increased Boggs Act penalties and mandatory prison sentence minimums for violations of existing drug laws.  1965 -- Drug Abuse Control Amendment Enacted to deal with problems caused by abuse of depressants, stimulants and hallucinogens. Restricted research into psychoactive drugs such as LSD by requiring FDA approval.  1970 -- Controlled Substance Act  Controlled Substances Import and Export Act These laws are a consolidation of numerous laws regulating the manufacture and distribution of narcotics, stimulants, depressants, hallucinogens, anabolic steroids, and chemicals used in the illicit production of controlled substances. The CSA places all substances that are regulated under existing federal law into one  of five schedules. This placement is based upon the substance's medicinal value, harmfulness, and potential for abuse or addiction. Schedule I is reserved for the most dangerous drugs that have no recognized medical use, while Schedule V is the classification used for the least dangerous drugs. The act also provides a mechanism for substances to be controlled, added to a schedule, decontrolled, removed from control, rescheduled, or transferred from one schedule to another.  76 - Drug Enforcement Agency By Executive Order, the DEA  was formed to take place of the [employer] of Narcotics and Dangerous Drugs.  49 -Narcotic Addict Treatment Act of  1974  - Public Law [phone] Amends the Controlled Substance Act of 1970 to provide for the registration of practitioners conducting narcotic treatment programs. [methadone clinics] It also provides legal definitions for the phrases maintenance treatment and detoxification treatment.  1986 -- Anti-Drug Abuse Act of 1986 Strengthened Federal efforts to encourage foreign cooperation in eradicating illicit drug crops and in halting international drug traffic, to improve enforcement of Federal drug laws and enhance interdiction of illicit drug shipments, to provide strong Market researcher in establishing effective drug abuse prevention and education programs, to [employer] support for drug abuse treatment and rehabilitation efforts, and for other purposes. It also re-imposed mandatory sentencing minimums depending on which drug and how much was involved.  1988 -- Anti-Drug Abuse Act of 1988 Established the Office of Materials Engineer (ONDCP) in the [employer] of the Economist; authorized funds for [employer], state and local drug enforcement activities, school-based drug prevention efforts, and drug abuse treatment with special emphasis on injecting drug abusers at high risk for AIDS.  2000 -- Federal - The Drug Addiction Treatment Act of 2000 (DATA 2000) It enables qualified physicians to prescribe and/or dispense narcotics for the purpose of treating opioid dependency. For the first time, physicians are able to treat this disease from their private offices or other clinical settings. This presents a very desirable treatment option for those who are unwilling or unable to seek help in drug treatment clinics. Patients can now be treated in the privacy of their doctors office, as are other people being treated for any other type of medical condition. One medicine doctors may  now prescribe is Buprenorphine. The major downfall of this Act is the limitation of 30 patients per practice - which means that large facilities, no matter how many physicians are there, can only treat 30 patients at a time.  2002-- DEA reschedules buprenorphine from a schedule V drug to a schedule III drug, on July 04, 2001 - the day before the FDA approval of Suboxone and Subutex despite overwhelming objection by the medical community.  2004: June 2004 THE CONFIDENTIALITY OF ALCOHOL AND DRUG ABUSE PATIENT RECORDS REGULATION AND THE HIPAA PRIVACY RULE:  Confidentiality of Alcohol and Drug Dependence Patient Records (summary) Code of Federal Regulations Title 42 Part 2 (42 CFR Part 2)  The confidentiality of alcohol and drug dependence patient records maintained by this practice/program is protected by federal law and regulations. Generally, the practice/program may not say to a person outside the practice/program that a patient attends the practice/program, or disclose any information identifying a patient as being alcohol or drug dependent unless:  The patient consents in writing; The disclosure is allowed by a court order, or The disclosure is made to medical personnel in a medical emergency or to qualified personnel for research,  audit, or practice/program evaluation. Violation of the federal law and regulations by a practice/program  is a crime. Suspected violations may be reported to appropriate authorities in accordance with federal regulations. Freight forwarder and regulations do not protect any information about a crime committed by a patient either at the practice/program or against any person who works for the practice/program or about any threat to commit such a crime. Federal laws and regulations do not protect any information about suspected child abuse or neglect from being reported under state law to appropriate state or local authorities.  sample consent form (MS-WORD)  2005:  04-29-2004 Public law [phone], Amends the Controlled Substances Act to eliminate the 30-patient limit for medical group practices allowed to dispense narcotic drugs in schedules III, IV, or V for maintenance or detoxification treatment (retains the 30-patient limit for an individual physician). This amendment removes the 30-patient limit on group medical practices that treat opioid dependence with buprenorphine. The restriction was part of the original Drug Addiction Treatment Act of 2000 (DATA) that allowed treatment of opioid dependence in a doctor's office. With this change, every certified doctor may now prescribe buprenorphine up to his or her individual physician limit of 30 patients.  2006: On 09/25/2005 President Levy signed Bill H.R.6344 into law. This allows physicians who have been certified to prescribe certain drugs for the treatment of opioid dependence under DATA2000 to treat up to 100 patients (up from 30) by submitting an intent notification to the Dept of Health and [employer]. This is a major step forward in both fighting the stigma and allowing access to treatment previously not available to some. For more details see 30/100-PATIENT LIMIT  2016: HHS augments regulations concerning the 30/100 patient limit by raising the limit to 275 for qualifying physicians. Link to summary of regulation  2016: Comprehensive Addiction and Recovery Act of 2016 (sec.303) amends the Controlled Substance Act - to allow Nurse Practitioners and Physician Assistants to become eligible to prescribe buprenorphine for the treatment of opioid use disorder. See the entire law for more details.  The roots of the concurrent regulation of certain drugs under two statutory schemes go back to the beginning of this century. In 1906, Congress enacted the Pure Food and Drug Act, establishing one regime of regulation to assure (among other things) that drugs were not adulterated or misbranded. These regulations were  amended several times, recodified in 1938, and expanded on again from the 1940s through the 1990s. Their implementation and enforcement is today assigned to the Food and Drug Administration (FDA) in the Department of Health and Human Services [facility]).  In 1914, Congress adopted the [place]atz Narcotic Act to stop abuse of addictive drugs. The [place]rette Narcotic Act was amended in 1937 to include marijuana. In 1965, amphetamines, barbiturates, and hallucinogens came under regulation, but under the [employer], Drug, and Cosmetic Act. In 1970, these various statutes were consolidated and recodified as the Controlled Substances Act (CSA), which has been amended several times since then. Its implementation and enforcement is today assigned to the Drug Enforcement Administration (DEA) in the Department of Justice.  The first clash occurred after World War I, when so-called morphine  clinics existed and physicians prescribed or dispensed morphine  to addicts. Some addicts were veterans of the American Civil War, the Spanish-American War, and WWI, who had become addicted during treatment for war wounds, but most of them came from the growing population of nonmedical addicts (Courtwright, 8017). The Narcotics Division of the Prohibition Unit of the Department of the Treasury, which was then responsible for enforcing the [facility] Narcotic Act, concluded that this  activity was not the legitimate practice of medicine but simple drug trafficking. The Treasury Department swiftly closed the clinics and made it personally and professionally risky for physicians to maintain a narcotic addict for any reason. In did so, however, only after the American Medical Association had adopted a resolution, in 1920, opposing ambulatory clinics''.  In 1972, the public health establishment, including the Secretary of Health, Education, and Welfare, the Education Officer, Environmental, the [employer] of [employer], and the Administrator, Arts for Drug Abuse Prevention, was unprepared to allow [employer] of Narcotics and Dangerous Drugs, DEA's predecessor agency, to unilaterally define the parameters of medical practice for the use of methadone in the treatment of heroin addiction. As a consequence, a new set of rules--the third, on top of the FDA and DEA schemes--was added, one that inserted FDA deeply into the practice of medicine, notwithstanding its protestations to the contrary. Congress ratified this joint responsibility of law enforcement and public health officials for methadone through this third set of rules in 1974 with the passage of the Narcotic Addict Treatment Act (NATA). To examine in detail the evolution of this third set of rules--commonly referred to as the FDA or DHHS methadone regulations--we turn, first, to the period of the mid-1960s.  Increased use of heroin in the post World War II period first became apparent in the early to mid 1950s. During the [employer], a minimum mandatory narcotics law was enacted in 1956, effective July 1957. 1962 [facility] conference on drug abuse, the [employer] on Narcotic and Drug Abuse (the [employer]) of 1963, the Drug Abuse Control Amendments of 1965, the President's Commission on [employer] and Administration of Justice (the [employer]) of [phone], and the Narcotic Addiction Rehabilitation Act of 1966.  The 1965 Drug Abuse Control Amendments brought under strict federal control all nonnarcotic drugs capable of producing serious psychotoxic effects when abused. This act also created the [employer] of Drug Abuse Control within the Department of Health, Education, and Welfare (DHEW) and shifted the basis for aon corporation of illegal drugs from tax principles (administered by the Department of Treasury) to the regulation of commerce (administered by the [employer]).  The 1966 Narcotic Addiction Rehabilitation Act TOUR MANAGER)  authorized the civil commitment of narcotic addicts, and federal assistance to state and local governments to develop a local system of drug treatment programs. With respect to the latter, the [employer] of Mental Health [facility]) initially proposed the gradual implementation of the state assistance effort, mainly through a common mental health mechanism--inpatient treatment programs. However, because of a perceived pressing need, the courts began to commit addicts to these programs even before they were officially opened or staffed. The NARA legislation imposed the following contract requirements on treatment centers: (1) thrice-a-week counseling sessions; (2) weekly urine tests; (3) restorative dental services; (4) psychological consultations and vocational training; and (5) the treatment modalities of drug-free outpatient, therapeutic community, and methadone maintenance. Reorganization Plan No. 1 of 1968 transferred the primary functions of the [employer] of Narcotics (FBN) from the [employer] to the Department of Justice; it also transferred the [employer] of Drug Abuse Control functions to the Department of Justice. Within the [employer], the [employer] of Narcotics and Dangerous Drugs (BNDD) was created, which became the Drug Enforcement Administration in 1973.   Under the first [place] administration [phone]), federal drug abuse policy developed in a significant way. These developments included a 1969 war on drugs presidential message, resulting  legislation in 1970, and a Chemical Engineer created by executive order in 1971 and authorized in statute in 1972. Brynn, in 1969, to send a message to Congress on drug abuse. Although this was the first time that a U.S. president invoked the war on drugs image, it was in retrospect the most balanced approach to the problem of drug abuse that had been advanced. The 1969 message resulted in the submission of legislation to the  Congress and the passage, the following year, of the Comprehensive Drug Abuse Prevention and Control Act of 1970 [employer] [phone], July 24, 1969). The act dealt with research, treatment, and prevention of drug abuse and drug dependence, and with drug abuse charity fundraiser. One major purpose of the 1970 legislation was to reverse some of the strictures of the [employer] of 1914. The 1970 act sought to clarify for the medical profession . . . the extent to which they may safely go in treating narcotic addicts as patients. Title I, in Section IV, charged the Surveyor, Minerals, Education, and Welfare, to determine the appropriate methods of professional practice in the medical treatment of the narcotic addiction of various classes of narcotic addicts. This provision constitutes the initial statutory basis for treatment standards. The law enforcement sections consolidated all prior federal statutes into the Controlled Substances Act and the Controlled Substances Export and Import Act (Titles II and III, respectively, of the Comprehensive Drug Abuse Prevention and Control Act of 1970). Under this legislation, substances were classified under five schedules according to their abuse potential, and psychological and physical effects. Methadone was placed in Schedule II, along with such opiate drugs as morphine , codeine , and hydrocodone .  One of the most important steps taken by President Brynn was to establish in June 1971 the Special Action Office for Drug Abuse Prevention (SAODAP) in the [employer] of the President ([place] [phone], March 14, 1970). In mid-1971, [facility] appointed Dr. Maple Dunnings as SAODAP director. Within a year, the Drug Abuse Prevention Office and Treatment Act of 1972 [employer] [phone], December 17, 1970) gave statutory authority to [facility], but limiting setting, on March 27, 1974, as the limit on its existence.  The purpose of the 1972 act was to bring the  resources of the federal government to bear on drug abuse with the immediate objective of significantly reducing its incidence and developing a comprehensive, coordinated long-term federal strategy to combat drug abuse.  Narcotic Addict Treatment Act [employer]) of 1974 [employer] [phone]), which amended the Controlled Substances Act. This legislation was driven by concern for the diversion of methadone to illicit channels that was occurring in 1972 and 1973, as reflected in the title of the Senate bill adopted on March 05, 1972, the Methadone Diversion Control Act of 1973. (U.S. Senate, 1970a, 8029a).  The 1980 final rule (45 FR 37305, June 17, 1979) reduced the minimum standard for admission from two years of addiction to one year coupled with a clinical determination that the individual was currently physiologically.  The regulations were next revised in 1989, following two proposals to modify them, one in 1983 and one in 1987.  Under President Tanda Corrente, a government-wide effort was made to review all federal government regulations and to eliminate or reduce the burden of these regulations on the private sector, state and nash-finch company, and wps resources.   The 1983 recommendations, though not adopted, did initiate another revision of the methadone regulations, which first found expression in a 1987 proposed rule (  52 FR 37047, June 29, 1986) and culminated in a final rule (54 FR 8954, November 28, 1987) at the end of the decade. In the 1987 proposed rule, the FDA and NIDA, in an effort to put the best face on the unenthusiastic 1983 response by the provider community to converting the regulations to guidelines, indicated that they had retained the current requirements necessary to achieve the goals of the 1974 NATA, but were proposing to streamline the regulation and to promote more efficient operation of methadone programs. The 1987 proposed rule, issued by the FDA and NIDA,  advanced the following changes in the methadone regulation: that detoxification treatment be divided into short-term (<21 days) and long-term (>21 and <180 days) treatment; that the minimum staffing ratio of one counselor to 50 patients be eliminated; that blood tests be allowed as ways to conduct initial drug screening or to meet the monthly testing requirements for six-day take-home patients; that the 72-hour notification of FDA and the pertinent state authority for methadone doses greater than 100 mg be eliminated; that special adverse reaction reporting requirements for methadone be eliminated and reliance placed upon general FDA reporting requirements; that a supervising counselor be allowed to conduct the annual review of the patient's treatment plan for certain qualified patients who had been in treatment for 3 years or longer; and that the requirement of an annual report of methadone treatment programs to the FDA be dropped. The FDA and NIDA issued a final rule on November 28, 1987, based on comments on the 1987 proposed (54 FR 8954). Concurrently, FDA and NIDA issued a six-page guidance document, which noted that the regulations, over time, had recommended certain practices that were not actually required. Public Health Service, in Congress, and elsewhere, to reorganize the Alcohol, Drug Abuse, and Mental Health Administration (ADAMHA). These efforts culminated in the [employer] of 1992 [employer] [phone], April 07, 1991), the main purpose of which was to transfer the research portions of the three ADAMHA institutes--NIDA, the [employer] of Alcoholism and Alcohol Abuse, and the [employer] of Mental Health--to the [employer] and to create the Substance Abuse and Museum/gallery Exhibitions Officer [facility]) as the home for the service functions of these entitles.  Guidelines for Opioid Treatment The Federal Guidelines for Opioid Treatment Programs - 2015  serve as a guide to accrediting organizations for developing accreditation standards. The guidelines also provide OTPs with information on how programs can achieve and maintain compliance with federal regulations. The 2015 guidelines are an update to the 2007 Guidelines for the Accreditation of Opioid Treatment Programs (PDF  547 KB). The new document reflects the obligation of OTPs to deliver care consistent with the patient-centered, integrated, and recovery-oriented standards of substance use treatment.  DPT oversees the certification of OTPs and provides guidance to nonprofit organizations and state governmental entities that want to become a SAMHSA-approved accrediting body. Learn more about the accreditation and certification of OTPs and [facility] oversight of OTP accreditation bodies.  Model Guidelines for [employer] With input from [facility], the Federation of [employer]son in 2013 adopted a revised version of the federations office-based opioid treatment policies. The Model Policy on DATA 2000 and Treatment of Opioid Addiction in the Medical Office - 2013 (PDF  279 KB) provides model guidelines for use by state medical boards in regulating office-based opioid treatment.  Holiday Guidance for Opioid Treatment Programs (PDF  203 KB) In response to requests for the upcoming federal holidays and ensuing  weekends (December 24th, 25th, and 26th and December 31st, Jan 1st, and Jan 2nd), this letter is to provide guidance regarding requests for unsupervised doses of medication for patients for these dates. View a sample [id number] (PDF  194 KB).  Federal regulation of drugs emerged as early as 13, under a law that addressed only imported drugs. In 1905 the [employer] launched a private, voluntary means of controlling a substantial part of the drug marketplace, a system that remained in place for over a half-century. Drug regulation in FDA has evolved considerably since  President Ricardo Para signed the 1906 Pure Food and Drugs Act.  1820 Eleven doctors set up the U.S. Pharmacopeia and record the first list of standard drugs. 1848 Drug Importation Act passed by Congress requires U.S. Customs Service inspection to stop entry of tainted, low quality drugs from overseas. 8116 Dr. Mitchell MICAEL Burrs becomes the chief chemist at the [facility] of [employer] adulteration studies.  1905 The American Medical Association [facility]) begins a voluntary program of drug approval that would last until 1955. In order to advertise in the [facility] and related journals, drug companies must show proof that the drug will treat what they claim. 1906 The original Food and Drug Act is passed by Congress on June 30 and signed by [employer]. The Act outlaws states from buying and selling food, drinks, and drugs that have been mislabeled and tainted. 1911 In U.S. v. Vicci, the [employer] that the [employer] and Drugs Act does not outlaw false medical claims but only false and misleading statements about the ingredients or identity of a drug. 1912 Congress passes the [place] Amendment to overcome the ruling in U.S. v. Vicci. The Act outlaws labeling medicines with fake medical claims that is meant to trick the buyer. 1930 The name of the Food, Drug, and Insecticide Administration is shortened to Food and Drug Administration (FDA) under an therapist, music. 1933 FDA recommends a total rewrite of the out-of-date 1906 Food and Drugs Act.   1937 Elixir Sulfanilamide, contain the poisonous liquid, diethylene glycol, kills 107 persons, many of whom are children, dramatizing the need to establish drug safety before marketing and to pass the pending food and drug law. 1938 Congress passes [employer], Drug, and Cosmetic (FDC) Act of 1938, which requires that new drugs show safety before selling. This starts a new system of  drug regulation. The Act also requires that safe limits be set for unavoidable poisonous matter and allows for factory inspections. The Directv is given power to oversee advertising for all FDAregulated products except prescription drugs. FDA states that sulfanilamide and other dangerous drugs must be given under the direction of a medical expert. This begins the requirement for prescription only (nonnarcotic) drugs (see 1951 Kingsley-Humphrey amendment). 1941 Nearly 300 deaths and injuries result from the use of sulfathiazole tablets, an antibiotic, tainted with the sedative, phenobarbital. In response, FDA drastically changes manufacturing and quality controls. These changes lead to the development of good manufacturing practices (GMPs). 1948 The [employer] in U.S. v. Floretta that FDA jurisdiction extends to retail stores, thereby allowing FDA to stop illegal sales of drugs by pharmacies including barbiturates and amphetamines. 1950 In Wal[employer]. v. U.S., a U.S. Court of Appeals rules that the directions for use on a drug label must include the drugs purpose. 1951 Congress passes the Irving-Humphrey Amendment, which defines the kinds of drugs that cannot be used safely without medical supervision.  The amendment limits sale of these drugs to prescription only by a medical professional. All other drugs are to be available without a prescription. 1952 A nationwide investigation by FDA reveals that chloramphenicol, an antibiotic, caused nearly 180 cases of often deadly blood diseases. Two years later FDA engages the [employer] of Hospital Pharmacists, the American Association of Medical Record Librarians, and later the American Medical Association in a voluntary program of drug reaction reporting. 1953 The [employer] Amendment clarifies previous law and requires FDA to give manufacturers written reports of conditions seen during  inspections and results of factory samples. 1962 Thalidomide, a new sleeping pill, causes severe birth defects of the arms and legs in thousands of babies born in [place]. The U.[place]. media reports on how Dr. Cathlean Mort, a FDA medical officer, helped prevent approval and marketing of Thalidomide in the [place] . These reports stirred up public support for stronger drug laws. 3 Congress passes the [employer]. For the first time, these laws require drug makers to prove their drug works before FDA can approve them for sale. The Advisory Committee on Investigational Drugs meets for the first time. This was the first meeting of a committee to advise FDA on product approval and policy on an ongoing basis. 1966 FDA contracts with the [employer] to measure the effectiveness of 4,000 marketed drugs approved on the basis of safety alone between [phone] and 10-07-1961. The Fair Packaging and Labeling Act requires all consumer products, in interstate commerce, to be honestly and informatively labeled. Oct 08, 1967 FDA forms the Drug Efficacy Study Implementation (DESI) to carry out recommendations of the [employer] of the effectiveness of drugs first sold between [place] and 01/10/19631971-01-10 FDA requires the first patient package insert, medicines must come with information for the patient about risks and benefits. 1972 Over-the-Counter Drug Review begins to enhance the safety, effectiveness and appropriate labeling of drugs sold without prescription. 1973 The U.S. Supreme Court upholds the [place] drug effectiveness law and approves FDAs action to control entire classes of products. 1982 FDA issues Tamper-resistant Packaging Regulations to prevent poisonings such as deaths from cyanide placed in Tylenol  capsules. Congress passes the [employer] in Oct 07, 1982, making it a crime to tamper with packaged  consumer products. October 08, 1983 Drug Price Advertising Account Planner Act (Hatch-Waxman Act) increases the availability of less costly generic drugs by allowing FDA to approve applications for generic versions of brand-name drugs without repeating the research that proved the safety and effectiveness of the brand-name drugs. The Act also allowed brand-name companies to apply for up to five years additional patent protection for the new medicines they developed to make up for time lost while their products were going through FDA's approval process. 1989 The FDA issued guidelines asking drug makers to decide if a drug is likely to have usefulness in elderly people and to include elderly people in studies when applicable. 1991 In 1980/10/07, the FDA and the Department of Health and Human Services published a policy on protecting people in research. In Oct 07, 1990, this policy is adopted by more than a dozen federal agencies involved in human subject research and becomes known as the Common Rule. 4 1993 FDA launches MedWatch, a system designed to collect reports from health professionals on problems with drugs and other medical products. FDA issues guidelines for measuring gender differences in responses to medication. Drug companies are encouraged to include patients of both sexes in their research of  drugs and to study any gender-specific effects. 1995 FDA declares cigarettes to be drug delivery devices. Limits are issued on marketing and sales to reduce smoking by young people. 1998 FDA introduces the Adverse Event Reporting System (AERS), a computerized database designed to store and study safety reports on already marketed drugs.  The Demographic Rule requires that a marketing application review data on safety and effectiveness by age, gender, and race. The Pediatric Rule requires drug makers of selected new and existing drugs to conduct studies on drug safety and effectiveness in  children. 1999 Creation of the Drug Facts Label for OTC drug products. The law requires all overthe-counter drug labels to have information in a standard format. These drug facts labels are designed to give the user easy-to-find information. 2000 The U. S. [employer], upholds an earlier decision from The [employer] and Drug Administration v. [employer]. et al. and rules 5-4 that FDA does not have authority to regulate tobacco as a drug. 2002 The Best Pharmaceuticals for Children Act, in exchange for studying the drug in children, the drug maker gets six months of selling their product without competition. 2003 The Pediatric Research Equity Act gives FDA the right to ask drug companies to study the effectiveness of new drugs in children. 2004 FDA advises medical professionals to limit the use of a pain reliever called Cox-2, a nonsteroidal anti-inflammatory drug (NSAIDs). Studies had shown that long-term use raised chances of heart attacks and strokes. The warning is also added to the over-thecounter NSAIDs Drug Facts label. Medicines used in hospitals must have a bar code to prevent patients from receiving the wrong medicine. 5 2005 The Drug Safety Board is formed, consisting of FDA staff and representatives from the [employer] of [address] and the [employer]. The Board advises the Director, Center for Drug Evaluation and Research, FDA, on drug safety issues and works with the agency in sharing safety information to health professionals and patients.  The [place]  Food and Drug Administration (FDA) was first created to enforce the Pure Food and Drug Act of 1906. In this capacity, the FDA is charged with protecting the health of the [place]  public, to ensure the quality of its food, medicine, and cosmetics. Before this time, the [place]  government had no formal oversight of these products and left issues of quality and purity to the individual  manufactures, or at times, individual states.    Review: [place] Stop ACT. (The Strengthen Opioid Misuse Prevention (STOP) Act of 2017). GENERAL ASSEMBLY OF [facility]  SESSION 2017 SESSION LAW 2017-74 HOUSE BILL 243  PMP mandatory The dispenser shall report: (1) The dispenser's DEA number. (2) The name of the patient for whom the controlled substance is being dispensed, and the patient's: a. Full address, including city, state, and zip code, b. Telephone number, and c. Date of birth. (3) The date the prescription was written. (4) The date the prescription was filled. (5) The prescription number. (6) Whether the prescription is new or a refill. (7) Metric quantity of the dispensed drug. (8) Estimated days of supply of dispensed drug, if provided to the dispenser. (9) National Drug Code of dispensed drug. (10) Prescriber's DEA number. (11) Method of payment for the prescription.  No paper prescriptions  Duration of scripts Acute vs Chronic prescribing  2016 CDC Guidelines for prescribing Opioids for Chronic Pain. (Updated in 2022.) Medical Board  Laws:  Prescription Laws Drug laws, rules, and regulations are constantly changing. Any attempt to summarize them would  quickly become outdated. Because of that, the Board encourages practitioners who seek guidance on prescribing procedures to refer to the sources listed below in addition to the Boards position statements, rules and Medical Practice Act.  [place]  Board of Pharmacy (NCBOP) (which offers the states pharmacy laws and rules, and links to the Code of Federal Regulations) [employer] Site: www.ncbop.org  [place]  General Statutes General Web Site: politicalpool.cz See: [place]  Food, Drug, and Cosmetic Act: [id number] & 106-134 See: [place]  Pharmacy Practice Act, Article 4A: [phone] See: [place]  Controlled Substances Act, Article 5: 90-86 & 90-113.8 See: Use of controlled substances to render  one mentally incapacitated or physically helpless: [facility]. Code, Title 21, Food & Drugs www.deadiversion.usdoj.gov Controlled Substances Schedules www.deadiversion.usdoj.gov Drug Warehouse Manager - www.deadiversion.usdoj.gov 42 CFR  8.12 - Federal opioid treatment standards.   Effective May 24, 2016, prior approval will be required for opioid analgesic doses for [facility]. Medicaid and [place] [employer] [facility]) beneficiaries which:  Exceed 120 mg of morphine  equivalents (MME) per day  Are greater than a 14-day supply of any opioid, or,  Are non-preferred opioid products on the [place] Medicaid Preferred Drug List (PDL)  FEDERAL 42 CFR  8.12 - Federal opioid treatment standards. Title II of the Comprehensive Drug Abuse Prevention and Control Act of 1970, commonly known as the Controlled Substance Act (CSA) Title 21 [place]  Code ([place]) Controlled Substances Act.   Reference:   ______________________________________________________________________       ______________________________________________________________________    Medication Rules  Purpose: To inform patients, and their family members, of our medication rules and regulations.  Applies to: All patients receiving prescriptions from our practice (written or electronic).  Pharmacy of record: This is the pharmacy where your electronic prescriptions will be sent. Make sure we have the correct one.  Electronic prescriptions: In compliance with the [place]  Strengthen Opioid Misuse Prevention (STOP) Act of 2017 (Session Law 2017-74/H243), effective September 28, 2018, all controlled substances must be electronically prescribed. Written prescriptions, faxing, or calling prescriptions to a pharmacy will no longer be done.  Prescription refills: These will be provided only during in-person appointments. No medications will be renewed without a face-to-face evaluation  with your provider. Applies to all prescriptions.  NOTE: The following applies primarily to controlled substances (Opioid* Pain Medications).   Type of encounter (visit): For patients receiving controlled substances, face-to-face visits are required. (Not an option and not up to the patient.)  Patient's Responsibilities: Pain Pills: Bring all pain pills to every appointment (except for procedure appointments). Pill counts are required.  Pill Bottles: Bring pills in original pharmacy bottle. Bring bottle, even if empty. Always bring the bottle of the most recent fill.  Medication refills: You are responsible for knowing and keeping track of what medications you are taking and when is it that you will need a refill. The day before your appointment: write a list of all prescriptions that need to be refilled. The day of the appointment: give the list to the admitting nurse. Prescriptions will be written only during appointments. No prescriptions will be written on procedure days. If you forget a medication: it will not be Called in, Faxed, or electronically sent. You will need to get another appointment to get these prescribed. No early refills. Do not call asking to have your prescription filled early. Partial  or short prescriptions: Occasionally your pharmacy may not have enough pills to fill your prescription.  NEVER ACCEPT a partial  fill or a prescription that is short of the total amount of pills that you were prescribed.  With controlled substances the law allows 72 hours for the pharmacy to complete the prescription.  If the prescription is not completed within 72 hours, the pharmacist will require a new prescription to be written. This means that you will be short on your medicine and we WILL NOT send another prescription to complete your original prescription.  Instead, request the pharmacy to send a carrier to a nearby branch to get enough medication to provide you with your full  prescription. Prescription Accuracy: You are responsible for carefully inspecting your prescriptions before leaving our office. Have the discharge nurse carefully go over each prescription with you, before taking them home. Make sure that your name is accurately spelled, that your address is correct. Check the name and dose of your medication to make sure it is accurate. Check the number of pills, and the written instructions to make sure they are clear and accurate. Make sure that you are given enough medication to last until your next medication refill appointment. Taking Medication: Take medication as prescribed. When it comes to controlled substances, taking less pills or less frequently than prescribed is permitted and encouraged. Never take more pills than instructed. Never take the medication more frequently than prescribed.  Inform other Doctors: Always inform, all of your healthcare providers, of all the medications you take. Pain Medication from other Providers: You are not allowed to accept any additional pain medication from any other Doctor or Healthcare provider. There are two exceptions to this rule. (see below) In the event that you require additional pain medication, you are responsible for notifying us , as stated below. Cough Medicine: Often these contain an opioid, such as codeine  or hydrocodone . Never accept or take cough medicine containing these opioids if you are already taking an opioid* medication. The combination may cause respiratory failure and death. Medication Agreement: You are responsible for carefully reading and following our Medication Agreement. This must be signed before receiving any prescriptions from our practice. Safely store a copy of your signed Agreement. Violations to the Agreement will result in no further prescriptions. (Additional copies of our Medication Agreement are available upon request.) Laws, Rules, & Regulations: All patients are expected to follow all  [address] and [employer], Rules, [place] [employer]. Ignorance of the Laws does not constitute a valid excuse.  Illegal drugs and Controlled Substances: The use of illegal substances (including, but not limited to marijuana and its derivatives) and/or the illegal use of any controlled substances is strictly prohibited. Violation of this rule may result in the immediate and permanent discontinuation of any and all prescriptions being written by our practice. The use of any illegal substances is prohibited. Adopted CDC guidelines & recommendations: Target dosing levels will be at or below 60 MME/day. Use of benzodiazepines** is not recommended. Urine Drug testing: Patients taking controlled substances will be required to provide a urine sample upon request. Do not void before coming to your medication management appointments. Hold emptying your bladder until you are admitted. The admitting nurse will inform you if a sample is required. Our practice reserves the right to call you at any time to provide a sample. Once receiving the call, you have 24 hours to comply with request. Not providing a sample upon request may result in termination of medication therapy.  Exceptions: There are only two exceptions to the rule of not receiving pain medications from other Healthcare Providers. Exception #  1 (Emergencies): In the event of an emergency (i.e.: accident requiring emergency care), you are allowed to receive additional pain medication. However, you are responsible for: As soon as you are able, call our office [phone], at any time of the day or night, and leave a message stating your name, the date and nature of the emergency, and the name and dose of the medication prescribed. In the event that your call is answered by a member of our staff, make sure to document and save the date, time, and the name of the person that took your information.  Exception #2 (Planned Surgery): In the event that you are  scheduled by another doctor or dentist to have any type of surgery or procedure, you are allowed (for a period no longer than 30 days), to receive additional pain medication, for the acute post-op pain. However, in this case, you are responsible for picking up a copy of our Post-op Pain Management for Surgeons handout, and giving it to your surgeon or dentist. This document is available at our office, and does not require an appointment to obtain it. Simply go to our office during business hours (Monday-Thursday from 8:00 AM to 4:00 PM) (Friday 8:00 AM to 12:00 Noon) or if you have a scheduled appointment with us , prior to your surgery, and ask for it by name. In addition, you are responsible for: calling our office [phone], at any time of the day or night, and leaving a message stating your name, name of your surgeon, type of surgery, and date of procedure or surgery. Failure to comply with your responsibilities may result in termination of therapy involving the controlled substances.  Consequences:  Non-compliance with the above rules may result in permanent discontinuation of medication prescription therapy. All patients receiving any type of controlled substance is expected to comply with the above patient responsibilities. Not doing so may result in permanent discontinuation of medication prescription therapy. Medication Agreement Violation. Following the above rules, including your responsibilities will help you in avoiding a Medication Agreement Violation (Breaking your Pain Medication Contract).  *Opioid medications include: morphine , codeine , oxycodone , oxymorphone, hydrocodone , hydromorphone , meperidine , tramadol , tapentadol , buprenorphine, fentanyl , methadone. **Benzodiazepine medications include: diazepam  (Valium ), alprazolam (Xanax), clonazepam (Klonopine), lorazepam (Ativan), clorazepate (Tranxene), chlordiazepoxide (Librium), estazolam (Prosom), oxazepam (Serax), temazepam   (Restoril ), triazolam (Halcion) (Last updated: 07/21/2023) ______________________________________________________________________     ______________________________________________________________________    Medication Recommendations and Reminders  Applies to: All patients receiving prescriptions (written and/or electronic).  Medication Rules & Regulations: You are responsible for reading, knowing, and following our Medication Rules document. These exist for your safety and that of others. They are not flexible and neither are we. Dismissing or ignoring them is an act of non-compliance that may result in complete and irreversible termination of such medication therapy. For safety reasons, non-compliance will not be tolerated. As with the U[place] fundamental legal principle of ignorance of the law is no defense, we will accept no excuses for not having read and knowing the content of documents provided to you by our practice.  Pharmacy of record:  Definition: This is the pharmacy where your electronic prescriptions will be sent.  We do not endorse any particular pharmacy. It is up to you and your insurance to decide what pharmacy to use.  We do not restrict you in your choice of pharmacy. However, once we write for your prescriptions, we will NOT be re-sending more prescriptions to fix restricted supply problems created by your pharmacy, or your insurance.  The  pharmacy listed in the electronic medical record should be the one where you want electronic prescriptions to be sent. If you choose to change pharmacy, simply notify our nursing staff. Changes will be made only during your regular appointments and not over the phone.  Recommendations: Keep all of your pain medications in a safe place, under lock and key, even if you live alone. We will NOT replace lost, stolen, or damaged medication. We do not accept Police Reports as proof of medications having been stolen. After you fill your  prescription, take 1 week's worth of pills and put them away in a safe place. You should keep a separate, properly labeled bottle for this purpose. The remainder should be kept in the original bottle. Use this as your primary supply, until it runs out. Once it's gone, then you know that you have 1 week's worth of medicine, and it is time to come in for a prescription refill. If you do this correctly, it is unlikely that you will ever run out of medicine. To make sure that the above recommendation works, it is very important that you make sure your medication refill appointments are scheduled at least 1 week before you run out of medicine. To do this in an effective manner, make sure that you do not leave the office without scheduling your next medication management appointment. Always ask the nursing staff to show you in your prescription , when your medication will be running out. Then arrange for the receptionist to get you a return appointment, at least 7 days before you run out of medicine. Do not wait until you have 1 or 2 pills left, to come in. This is very poor planning and does not take into consideration that we may need to cancel appointments due to bad weather, sickness, or emergencies affecting our staff. DO NOT ACCEPT A Partial Fill: If for any reason your pharmacy does not have enough pills/tablets to completely fill or refill your prescription, do not allow for a partial fill. The law allows the pharmacy to complete that prescription within 72 hours, without requiring a new prescription. If they do not fill the rest of your prescription within those 72 hours, you will need a separate prescription to fill the remaining amount, which we will NOT provide. If the reason for the partial fill is your insurance, you will need to talk to the pharmacist about payment alternatives for the remaining tablets, but again, DO NOT ACCEPT A PARTIAL FILL, unless you can trust your pharmacist to obtain the  remainder of the pills within 72 hours.  Prescription refills and/or changes in medication(s):  Prescription refills, and/or changes in dose or medication, will be conducted only during scheduled medication management appointments. (Applies to both, written and electronic prescriptions.) No refills on procedure days. No medication will be changed or started on procedure days. No changes, adjustments, and/or refills will be conducted on a procedure day. Doing so will interfere with the diagnostic portion of the procedure. No phone refills. No medications will be called into the pharmacy. No Fax refills. No weekend refills. No Holliday refills. No after hours refills.  Remember:  Business hours are:  Monday to Thursday 8:00 AM to 4:00 PM Provider's Schedule: Eric Como, MD - Appointments are:  Medication management: Monday and Wednesday 8:00 AM to 4:00 PM Procedure day: Tuesday and Thursday 7:30 AM to 4:00 PM Wallie Sherry, MD - Appointments are:  Medication management: Tuesday and Thursday 8:00 AM to 4:00 PM Procedure day:  Monday and Wednesday 7:30 AM to 4:00 PM (Last update: 07/21/2022) ______________________________________________________________________     ______________________________________________________________________    National Pain Medication Shortage  The [place] is experiencing worsening drug shortages. These have had a negative widespread effect on patient care and treatment. Not expected to improve any time soon. Predicted to last past 2029.   Drug shortage list (generic names) Oxycodone  IR Oxycodone /APAP Oxymorphone IR Hydromorphone  Hydrocodone /APAP Morphine   Where is the problem?  Manufacturing and supply level.  Will this shortage affect you?  Only if you take any of the above pain medications.  How? You may be unable to fill your prescription.  Your pharmacist may offer a partial fill of your prescription. (Warning: Do not accept partial  fills.) Prescriptions partially filled cannot be transferred to another pharmacy. Read our Medication Rules and Regulation. Depending on how much medicine you are dependent on, you may experience withdrawals when unable to get the medication.  Recommendations: Consider ending your dependence on opioid pain medications. Ask your pain specialist to assist you with the process. Consider switching to a medication currently not in shortage, such as Buprenorphine. Talk to your pain specialist about this option. Consider decreasing your pain medication requirements by managing tolerance thru Drug Holidays. This may help minimize withdrawals, should you run out of medicine. Control your pain thru the use of non-pharmacological interventional therapies.   Your prescriber: Prescribers cannot be blamed for shortages. Medication manufacturing and supply issues cannot be fixed by the prescriber.   NOTE: The prescriber is not responsible for supplying the medication, or solving supply issues. Work with your pharmacist to solve it. The patient is responsible for the decision to take or continue taking the medication and for identifying and securing a legal supply source. By law, supplying the medication is the job and responsibility of the pharmacy. The prescriber is responsible for the evaluation, monitoring, and prescribing of these medications.   Prescribers will NOT: Re-issue prescriptions that have been partially filled. Re-issue prescriptions already sent to a pharmacy.  Re-send prescriptions to a different pharmacy because yours did not have your medication. Ask pharmacist to order more medicine or transfer the prescription to another pharmacy. (Read below.)  New 2023 regulation: May 29, 2022 Revised Regulation Allows DEA-Registered Pharmacies to Transfer Electronic Prescriptions at a Patients Request DEA Headquarters Division - Public Information Office Patients now have the ability to  request their electronic prescription be transferred to another pharmacy without having to go back to their practitioner to initiate the request. This revised regulation went into effect on Monday, May 25, 2022.     At a patients request, a DEA-registered retail pharmacy can now transfer an electronic prescription for a controlled substance (schedules II-V) to another DEA-registered retail pharmacy. Prior to this change, patients would have to go through their practitioner to cancel their prescription and have it re-issued to a different pharmacy. The process was taxing and time consuming for both patients and practitioners.    The Drug Enforcement Administration [facility]) published its intent to revise the process for transferring electronic prescriptions on August 16, 2020.  The final rule was published in the federal register on April 23, 2022 and went into effect 30 days later.  Under the final rule, a prescription can only be transferred once between pharmacies, and only if allowed under existing state or other applicable law. The prescription must remain in its electronic form; may not be altered in any way; and the transfer must be communicated directly between two licensed pharmacists.  Its important to note, any authorized refills transfer with the original prescription, which means the entire prescription will be filled at the same pharmacy.  Reference: hugehand.is [facility] website announcement)  [email or web address] [employer])   [employer] / Vol. 88, No. 143 / Thursday, April 23, 2022 / Rules and Regulations DEPARTMENT OF JUSTICE  Drug Enforcement Administration  21 CFR Part 1306  [Docket No. [id number]]  RIN [id number] Transfer of Electronic Prescriptions for Schedules II-V Controlled Substances Between  Pharmacies for Initial Filling  ______________________________________________________________________       ______________________________________________________________________    Transfer of Pain Medication between Pharmacies  Re: 2023 DEA Clarification on existing regulation  Published on DEA Website: May 29, 2022  Title: Revised Regulation Allows DEA-Registered Pharmacies to Electrical Engineer Prescriptions at a Patients Request DEA Headquarters Division - [employer]  Patients now have the ability to request their electronic prescription be transferred to another pharmacy without having to go back to their practitioner to initiate the request. This revised regulation went into effect on Monday, May 25, 2022.     At a patients request, a DEA-registered retail pharmacy can now transfer an electronic prescription for a controlled substance (schedules II-V) to another DEA-registered retail pharmacy. Prior to this change, patients would have to go through their practitioner to cancel their prescription and have it re-issued to a different pharmacy. The process was taxing and time consuming for both patients and practitioners.    The Drug Enforcement Administration [facility]) published its intent to revise the process for transferring electronic prescriptions on August 16, 2020.  The final rule was published in the federal register on April 23, 2022 and went into effect 30 days later.  Under the final rule, a prescription can only be transferred once between pharmacies, and only if allowed under existing state or other applicable law. The prescription must remain in its electronic form; may not be altered in any way; and the transfer must be communicated directly between two licensed pharmacists. Its important to note, any authorized refills transfer with the original prescription, which means the entire prescription will be filled at the same pharmacy.     REFERENCES: 1. DEA website announcement hugehand.is  2. Department of Justice website  Cheapwipes.at.pdf  3. DEPARTMENT OF JUSTICE Drug Enforcement Administration 21 CFR Part 1306 [Docket No. [id number]] RIN [id number] Transfer of Electronic Prescriptions for Schedules II-V Controlled Substances Between Pharmacies for Initial Filling  ______________________________________________________________________       ______________________________________________________________________    Medication Transfer   Notification You are currently compliant and stable on your pain medication regimen. This regimen will be transferred today to your Primary Care Provider (PCP). You will be provided with enough prescriptions to last for 90 days. After that, your prescriptions will need to be taken over by your PCP.  Recommendation Immediately contact your primary care provider to secure an appointment for evaluation before this period is over. Do not wait until the last month to contact them.   Clarification The transfer of your medication regimen does not mean that you are being discharged from our clinic. We will remain available to you for any consultation or interventional therapies you may need.   Alternative Should you decide not to continue taking these medication and would like assistance in permanently stopping them, please let us  know so that we can design a slow tapering down of your regimen.  Reason Our primary responsibility to provide specialized interventional pain management therapies otherwise not available to the  community. We have in the past assisted primary care providers with reviewing and adjusting pain medication management therapies, however, we have been transparent to all patients and referring providers that it is not  our intention to permanently take over this type of therapy. Transfer of this portion of your care will assist us  in freeing time to assist others in need of our specialty services.   ______________________________________________________________________      ______________________________________________________________________    WARNING: CBD (cannabidiol) & Delta (Delta-8 tetrahydrocannabinol) products.   Applicable to:  All individuals currently taking or considering taking CBD (cannabidiol) and, more important, all patients taking opioid analgesic controlled substances (pain medication). (Example: oxycodone ; oxymorphone; hydrocodone ; hydromorphone ; morphine ; methadone; tramadol ; tapentadol ; fentanyl ; buprenorphine; butorphanol; dextromethorphan ; meperidine ; codeine ; etc.)  Introduction:  Recently there has been a drive towards the use of natural products for the treatment of different conditions, including pain anxiety and sleep disorders. Marijuana and hemp are two varieties of the cannabis genus plants. Marijuana and its derivatives are illegal, while hemp and its derivatives are not. Cannabidiol (CBD) and tetrahydrocannabinol (THC), are two natural compounds found in plants of the Cannabis genus. They can both be extracted from hemp or marijuana. Both compounds interact with your bodys endocannabinoid system in very different ways. CBD is associated with pain relief (analgesia) while THC is associated with the psychoactive effects (the high) obtained from the use of marijuana products. There are two main types of THC: Delta-9, which comes from the marijuana plant and it is illegal, and Delta-8, which comes from the hemp plant, and it is legal. (Both, Delta-9-THC and Delta-8-THC are psychoactive and give you the high.)   Legality:  Marijuana and its derivatives: illegal Hemp and its derivatives: Legal (State dependent) UPDATE: (11/14/2021) The Drug Enforcement Agency (DEA) issued a  letter stating that delta cannabinoids, including Delta-8-THCO and Delta-9-THCO, synthetically derived from hemp do not qualify as hemp and will be viewed as Schedule I drugs. (Schedule I drugs, substances, or chemicals are defined as drugs with no currently accepted medical use and a high potential for abuse. Some examples of Schedule I drugs are: heroin, lysergic acid diethylamide (LSD), marijuana (cannabis), 3,4-methylenedioxymethamphetamine (ecstasy), methaqualone, and peyote.) (cuetune.com.ee)  Legal status of CBD in :  Conditionally Legal  Reference: FDA Regulation of Cannabis and Cannabis-Derived Products, Including Cannabidiol (CBD) - oemdeals.dk  Warning:  CBD is not FDA approved and has not undergo the same manufacturing controls as prescription drugs.  This means that the purity and safety of available CBD may be questionable. Most of the time, despite manufacturer's claims, it is contaminated with THC (delta-9-tetrahydrocannabinol - the chemical in marijuana responsible for the HIGH).  When this is the case, the [facility] contaminant will trigger a positive urine drug screen (UDS) test for Marijuana (carboxy-THC).   The FDA recently put out a warning about 5 things that everyone should be aware of regarding Delta-8 THC: Delta-8 THC products have not been evaluated or approved by the FDA for safe use and may be marketed in ways that put the public health at risk. The FDA has received adverse event reports involving delta-8 THC-containing products. Delta-8 THC has psychoactive and intoxicating effects. Delta-8 THC manufacturing often involve use of potentially harmful chemicals to create the concentrations of delta-8 THC claimed in the marketplace. The final delta-8 THC product may have potentially harmful by-products (contaminants) due to the chemicals used in the  process. Manufacturing of delta-8 THC products may occur in uncontrolled or unsanitary settings, which may lead to the  presence of unsafe contaminants or other potentially harmful substances. Delta-8 THC products should be kept out of the reach of children and pets.  NOTE: Because a positive UDS for any illicit substance is a violation of our medication agreement, your opioid analgesics (pain medicine) may be permanently discontinued.  MORE ABOUT CBD  General Information: CBD was discovered in [birth year] and it is a derivative of the cannabis sativa genus plants (Marijuana and Hemp). It is one of the 113 identified substances found in Marijuana. It accounts for up to 40% of the plant's extract. As of 2018, preliminary clinical studies on CBD included research for the treatment of anxiety, movement disorders, and pain. CBD is available and consumed in multiple forms, including inhalation of smoke or vapor, as an aerosol spray, and by mouth. It may be supplied as an oil containing CBD, capsules, dried cannabis, or as a liquid solution. CBD is thought not to be as psychoactive as THC (delta-9-tetrahydrocannabinol - the chemical in marijuana responsible for the HIGH). Studies suggest that CBD may interact with different biological target receptors in the body, including cannabinoid and other neurotransmitter receptors. As of 2018 the mechanism of action for its biological effects has not been determined.  Side-effects  Adverse reactions: Dry mouth, diarrhea, decreased appetite, fatigue, drowsiness, malaise, weakness, sleep disturbances, and others.  Drug interactions:  CBD may interact with medications such as blood-thinners. CBD causes drowsiness on its own and it will increase drowsiness caused by other medications, including antihistamines (such as Benadryl), benzodiazepines (Xanax, Ativan, Valium ), antipsychotics, antidepressants, opioids, alcohol and supplements such as kava, melatonin and St. John's Wort.   Other drug interactions: Brivaracetam (Briviact); Caffeine; Carbamazepine (Tegretol); Citalopram (Celexa); Clobazam (Onfi); Eslicarbazepine (Aptiom); Everolimus (Zostress); Lithium; Methadone (Dolophine); Rufinamide (Banzel); Sedative medications (CNS depressants); Sirolimus (Rapamune); Stiripentol (Diacomit); Tacrolimus (Prograf); Tamoxifen ; Soltamox); Topiramate  (Topamax ); Valproate; Warfarin (Coumadin); Zonisamide. (Last update: 09/07/2022) ______________________________________________________________________     ______________________________________________________________________    Muscle Spasms & Cramps  Cause(s):  Most common - vitamin and/or electrolyte (calcium, potassium, sodium, etc.) deficiencies. Post procedure - steroids (injected, oral, or inhaled) can make your kidneys excrete (loose) electrolytes. Most of the time this will not cause any symptoms however, if you happen to be borderline low on your electrolytes, it may temporarily triggering cramps & spasms.  Possible triggers: Sweating - causes loss of electrolytes thru the skin. Steroids - causes loss of electrolytes thru the urine.  Treatment: (over-the-counter)  Gatorade (or any other electrolyte-replenishing drink) - Take 1, 8 oz glass with each meal (3 times a day). Mechanism of action: Replenishes lost electrolytes. Magnesium 400 to 500 mg - Take 1 tablet twice a day (one with breakfast and one at bedtime). If you have kidney disease talk to your primary care physician before taking any Magnesium. Mechanism of action: Magnesium is a natural muscle relaxant. Tonic Water with quinine - Take 1, 8 oz glass before bedtime.  Mechanism of action: Quinine is used to treat spasms.  Last Update: 04/08/2023  ______________________________________________________________________     ______________________________________________________________________    Procedure instructions  Stop blood-thinners  Do not eat or drink  fluids (other than water) for 8 hours before your procedure  No water for 2 hours before your procedure  Take your blood pressure medicine with a sip of water  Arrive 30 minutes before your appointment  If sedation is planned, bring suitable driver. Nada, [place]isele, & public transportation are NOT APPROVED)  Carefully read the Preparing for your procedure detailed instructions  If you have questions  call us  at [phone]  Procedure appointments are for procedures only.   NO medication refills or new problem evaluations will be done on procedure days.   Only the scheduled, pre-approved procedure and side will be done.   ______________________________________________________________________     ______________________________________________________________________    Preparing for your procedure  Appointments: If you think you may not be able to keep your appointment, call 24-48 hours in advance to cancel. We need time to make it available to others.  Procedure visits are for procedures only. During your procedure appointment there will be: NO Prescription Refills*. NO medication changes or discussions*. NO discussion of disability issues*. NO unrelated pain problem evaluations*. NO evaluations to order other pain procedures*. *These will be addressed at a separate and distinct evaluation encounter on the provider's evaluation schedule and not during procedure days.  Instructions: Food intake: Avoid eating anything solid for at least 8 hours prior to your procedure. Clear liquid intake: You may take clear liquids such as water up to 2 hours prior to your procedure. (No carbonated drinks. No soda.) Transportation: Unless otherwise stated by your physician, bring a driver. (Driver cannot be a Market Researcher, Pharmacist, Community, or any other form of public transportation.) Morning Medicines: Except for blood thinners, take all of your other morning medications with a sip of water. Make sure to take  your heart and blood pressure medicines. If your blood pressure's lower number is above 100, the case will be rescheduled. Blood thinners: Make sure to stop your blood thinners as instructed.  If you take a blood thinner, but were not instructed to stop it, call our office [phone] and ask to talk to a nurse. Not stopping a blood thinner prior to certain procedures could lead to serious complications. Diabetics on insulin: Notify the staff so that you can be scheduled 1st case in the morning. If your diabetes requires high dose insulin, take only  of your normal insulin dose the morning of the procedure and notify the staff that you have done so. Preventing infections: Shower with an antibacterial soap the morning of your procedure.  Build-up your immune system: Take 1000 mg of Vitamin C with every meal (3 times a day) the day prior to your procedure. Antibiotics: Inform the nursing staff if you are taking any antibiotics or if you have any conditions that may require antibiotics prior to procedures. (Example: recent joint implants)   Pregnancy: If you are pregnant make sure to notify the nursing staff. Not doing so may result in injury to the fetus, including death.  Sickness: If you have a cold, fever, or any active infections, call and cancel or reschedule your procedure. Receiving steroids while having an infection may result in complications. Arrival: You must be in the facility at least 30 minutes prior to your scheduled procedure. Tardiness: Your scheduled time is also the cutoff time. If you do not arrive at least 15 minutes prior to your procedure, you will be rescheduled.  Children: Do not bring any children with you. Make arrangements to keep them home. Dress appropriately: There is always a possibility that your clothing may get soiled. Avoid long dresses. Valuables: Do not bring any jewelry or valuables.  Reasons to call and reschedule or cancel your procedure: (Following these  recommendations will minimize the risk of a serious complication.) Surgeries: Avoid having procedures within 2 weeks of any surgery. (Avoid for 2 weeks before or after any surgery). Flu Shots: Avoid having procedures within 2 weeks of a flu  shots or . (Avoid for 2 weeks before or after immunizations). Barium: Avoid having a procedure within 7-10 days after having had a radiological study involving the use of radiological contrast. (Myelograms, Barium swallow or enema study). Heart attacks: Avoid any elective procedures or surgeries for the initial 6 months after a Myocardial Infarction (Heart Attack). Blood thinners: It is imperative that you stop these medications before procedures. Let us  know if you if you take any blood thinner.  Infection: Avoid procedures during or within two weeks of an infection (including chest colds or gastrointestinal problems). Symptoms associated with infections include: Localized redness, fever, chills, night sweats or profuse sweating, burning sensation when voiding, cough, congestion, stuffiness, runny nose, sore throat, diarrhea, nausea, vomiting, cold or Flu symptoms, recent or current infections. It is specially important if the infection is over the area that we intend to treat. Heart and lung problems: Symptoms that may suggest an active cardiopulmonary problem include: cough, chest pain, breathing difficulties or shortness of breath, dizziness, ankle swelling, uncontrolled high or unusually low blood pressure, and/or palpitations. If you are experiencing any of these symptoms, cancel your procedure and contact your primary care physician for an evaluation.  Remember:  Regular Business hours are:  Monday to Thursday 8:00 AM to 4:00 PM  Provider's Schedule: Eric Como, MD:  Procedure days: Tuesday and Thursday 7:30 AM to 4:00 PM  Wallie Sherry, MD:  Procedure days: Monday and Wednesday 7:30 AM to 4:00 PM Last  Updated:  09/07/2023 ______________________________________________________________________     ______________________________________________________________________    Blood Thinners  IMPORTANT NOTICE:  If you take any of these, make sure to notify the nursing staff.  Failure to do so may result in serious injury.  Recommended time intervals to stop and restart blood-thinners, before & after invasive procedures  Generic Name Brand Name Pre-procedure: Stop medication for this amount of time before your procedure: Post-procedure: Wait this amount of time after the procedure before restarting your medication:  Abciximab Reopro 15 days 2 hrs  Alteplase Activase 10 days 10 days  Anagrelide Agrylin    Apixaban  Eliquis  3 days 6 hrs  Cilostazol Pletal 3 days 5 hrs  Clopidogrel Plavix 7-10 days 2 hrs  Dabigatran Pradaxa 5 days 6 hrs  Dalteparin Fragmin 24 hours 4 hrs  Dipyridamole Aggrenox 11days 2 hrs  Edoxaban Lixiana; Savaysa 3 days 2 hrs  Enoxaparin   Lovenox  24 hours 4 hrs  Eptifibatide Integrillin 8 hours 2 hrs  Fondaparinux  Arixtra 72 hours 12 hrs  Hydroxychloroquine Plaquenil 11 days   Prasugrel Effient 7-10 days 6 hrs  Reteplase Retavase 10 days 10 days  Rivaroxaban Xarelto 3 days 6 hrs  Ticagrelor Brilinta 5-7 days 6 hrs  Ticlopidine Ticlid 10-14 days 2 hrs  Tinzaparin Innohep 24 hours 4 hrs  Tirofiban Aggrastat 8 hours 2 hrs  Warfarin Coumadin 5 days 2 hrs   Other medications with blood-thinning effects  NOTE: Consider stopping these if you have prolonged bleeding despite not taking any of the above blood thinners. Otherwise ask your provider and this will be decided on a case-by-case basis.  Product indications Generic (Brand) names Note  Cholesterol Lipitor Stop 4 days before procedure  Blood thinner (injectable) Heparin  (LMW or LMWH Heparin ) Stop 24 hours before procedure  Cancer Ibrutinib (Imbruvica) Stop 7 days before procedure  Malaria/Rheumatoid Hydroxychloroquine  (Plaquenil) Stop 11 days before procedure  Thrombolytics  10 days before or after procedures   Over-the-counter (OTC) Products with blood-thinning effects  NOTE: Consider stopping these if  you have prolonged bleeding despite not taking any of the above blood thinners. Otherwise ask your provider and this will be decided on a case-by-case basis.  Product Common names Stop Time  Aspirin > 325 mg Goody Powders, Excedrin, etc. 11 days  Aspirin <= 81 mg  7 days  Fish oil  4 days  Garlic supplements  7 days  Ginkgo biloba  36 hours  Ginseng  24 hours  NSAIDs Ibuprofen , Naprosyn , etc. 3 days  Vitamin E  4 days   ______________________________________________________________________

## 2024-09-14 NOTE — Progress Notes (Signed)
 PROVIDER NOTE: Interpretation of information contained herein should be left to medically-trained personnel. Specific patient instructions are provided elsewhere under Patient Instructions section of medical record. This document was created in part using AI and STT-dictation technology, any transcriptional errors that may result from this process are unintentional.  Patient: Kimberly Cowan  Service: E/M   PCP: Justus Leita DEL, MD  DOB: 03-01-1966  DOS: 09/14/2024  Provider: Emmy MARLA Blanch, NP  MRN: [id number]  Delivery: Face-to-face  Specialty: Interventional Pain Management  Type: Established Patient  Setting: Ambulatory outpatient facility  Specialty designation: 09  Referring Prov.: Justus Leita DEL, MD  Location: Outpatient office facility       History of present illness (HPI) Ms. Kalen Neidert, a 58 y.o. year old female, is here today because of her Lumbar facet joint syndrome [M47.816]. Ms. Mestas primary complain today is Back Pain and Leg Pain  Pertinent problems: Ms. Sanseverino has Headache, migraine; Prediabetes; Chronic radicular lumbar pain; Chronic GERD; Failed back surgical syndrome; Spinal stenosis of lumbar region; Bariatric surgery status; Degenerative spondylolisthesis; Chronic pain disorder; and Lumbar facet joint syndrome on their pertinent problem list.  Pain Assessment: Severity of Chronic pain is reported as a 10-Worst pain ever/10. Location: Back  /radiates into both legs to toes. Onset: More than a month ago. Quality: Aching, Sharp, Stabbing, Shooting. Timing: Constant. Modifying factor(s): nothing. Vitals:  height is 5' 7 (1.702 m) and weight is 213 lb (96.6 kg). Her temperature is 97.2 F (36.2 C) (abnormal). Her blood pressure is 144/83 (abnormal) and her pulse is 78. Her respiration is 18 and oxygen saturation is 99%.  BMI: Estimated body mass index is 33.36 kg/m as calculated from the following:   Height as of this encounter:  5' 7 (1.702 m).   Weight as of this encounter: 213 lb (96.6 kg).  Last encounter: 06/20/2024. Last procedure: 06/20/2024.  Reason for encounter: evaluation for possible interventional PM therapy/treatment and medication management. No change in medical history since last visit.  Patient's pain is at baseline.  Patient continues multimodal pain regimen as prescribed.  States that it provides pain relief and improvement in functional status.   Discussed the use of AI scribe software for clinical note transcription with the patient, who gave verbal consent to proceed.  History of Present Illness   Kimberly Cowan is a 58 year old female who presents with lower back pain radiating down her legs.  The patient continues experiencing low back pain that impact enough to perform her daily activities.  She has a history of lumbar fusion at L5-S1 and she has undergone epidural injection before surgeries.  Despite prior interventional treatments and surgery, she continues to experiencing low back pain with standing, bending and sitting.  As per Dr. Marcelino note, he recommended lumbar facet block first, and then ablation if nerve blocks are positive.    She experiences lower back pain radiating down her legs, which has been exacerbated by cold weather. The pain has intensified recently, leading her to increase her medication intake during the day.  She uses hydrocodone -acetaminophen  (Norco) 10-325 mg as needed, typically taking half a tablet to manage her symptoms at work. The severity of her back pain has disrupted her sleep, stating she hasn't slept since Sunday due to the pain.  She is not currently taking Flexeril . She has some gabapentin  at home but requires a refill    Pharmacotherapy Assessment   Norco 10-325 mg every 8 hours as needed for pain.  Nightly only. MME=30 Tizanidine (Zanaflex) 4 mg 3 times daily Gabapentin  (Neurontin ) 300 mg (total 2 tablet) twice  daily Monitoring: [place] PMP: PDMP reviewed during this encounter.       Pharmacotherapy: No side-effects or adverse reactions reported. Compliance: No problems identified. Effectiveness: Clinically acceptable.  Rebecka Wolm HERO, RN  09/14/2024  1:56 PM  Sign when Signing Visit Nursing Pain Medication Assessment:  Safety precautions to be maintained throughout the outpatient stay will include: orient to surroundings, keep bed in low position, maintain call bell within reach at all times, provide assistance with transfer out of bed and ambulation.  Medication Inspection Compliance: Pill count conducted under aseptic conditions, in front of the patient. Neither the pills nor the bottle was removed from the patient's sight at any time. Once count was completed pills were immediately returned to the patient in their original bottle.  Medication: Hydrocodone /APAP Pill/Patch Count: 0 of 90 pills/patches remain Pill/Patch Appearance: Markings consistent with prescribed medication Bottle Appearance: Standard pharmacy container. Clearly labeled. Filled Date: 09 / 23 / 2025 Last Medication intake:  1 week ago    UDS:  Summary  Date Value Ref Range Status  03/28/2024 FINAL  Final    Comment:    ==================================================================== [employer] (MW) ==================================================================== Test                             Result       Flag       Units  Drug Present and Declared for Prescription Verification   Oxazepam                       696          EXPECTED   ng/mg creat   Temazepam                       >3846        EXPECTED   ng/mg creat    Oxazepam and temazepam  are expected metabolites of diazepam .    Oxazepam is also an expected metabolite of other benzodiazepine    drugs, including chlordiazepoxide, prazepam, clorazepate, halazepam,    and temazepam .  Oxazepam and temazepam  are available as scheduled    prescription  medications.    Norhydrocodone                 338          EXPECTED   ng/mg creat    Norhydrocodone is an expected metabolite of hydrocodone .  Drug Absent but Declared for Prescription Verification   Hydrocodone                     Not Detected UNEXPECTED ng/mg creat    Hydrocodone  is almost always present in patients taking this drug    consistently. Absence of hydrocodone  could be due to lapse of time    since the last dose or unusual pharmacokinetics (rapid metabolism).  ==================================================================== Test                      Result    Flag   Units      Ref Range   Creatinine              52               mg/dL      >=79 ==================================================================== Declared Medications:  The flagging and  interpretation on this report are based on the  following declared medications.  Unexpected results may arise from  inaccuracies in the declared medications.   **Note: The testing scope of this panel includes these medications:   Hydrocodone  (Norco)  Temazepam    **Note: The testing scope of this panel does not include the  following reported medications:   Acetaminophen  (Norco)  Atropine  (Lomotil )  Cyanocobalamin   Cyclobenzaprine   Diphenoxylate  (Lomotil )  Fluticasone  (Flonase )  Furosemide  (Lasix )  Gabapentin  (Neurontin )  Latanoprost   Multivitamin  Pantoprazole  (Protonix )  Potassium (Klor-Con )  Sertraline  ==================================================================== For clinical consultation, please call [phone]. ====================================================================     No results found for: CBDTHCR No results found for: D8THCCBX No results found for: D9THCCBX  ROS  Constitutional: Denies any fever or chills Gastrointestinal: No reported hemesis, hematochezia, vomiting, or acute GI distress Musculoskeletal: Low back pain (Facet mediated pain) Neurological: No  reported episodes of acute onset apraxia, aphasia, dysarthria, agnosia, amnesia, paralysis, loss of coordination, or loss of consciousness  Medication Review  HYDROcodone -acetaminophen , NON FORMULARY, Vitamin B-Complex, Vitamin D  (Ergocalciferol ), cyanocobalamin , diclofenac, diphenoxylate -atropine , fluticasone , furosemide , gabapentin , latanoprost , pantoprazole , potassium chloride , rizatriptan , sertraline , temazepam , and tiZANidine  History Review  Allergy: Ms. Strohm is allergic to morphine  and codeine , penicillins, latex, and baclofen . Drug: Ms. Baldo  reports no history of drug use. Alcohol:  reports no history of alcohol use. Tobacco:  reports that she has never smoked. She has never used smokeless tobacco. Social: Ms. Sapp  reports that she has never smoked. She has never used smokeless tobacco. She reports that she does not drink alcohol and does not use drugs. Medical:  has a past medical history of Acute respiratory failure with hypoxia (HCC) (12/31/2022), Allergy (1999), Anemia, Anxiety, Arm pain, left (05/17/2018), Arthritis, Bilateral lumbar radiculopathy, Cellulitis of abdominal wall (03/03/2021), Chronic back pain, Chronic GERD (03/03/2021), Clinical depression (04/04/2012), Depression, DVT (deep venous thrombosis) (HCC), Encounter for screening colonoscopy, Essential (primary) hypertension (06/04/2020), Glaucoma, Headache(784.0), Major depressive disorder with single episode, in full remission (08/15/2018), Malfunction of jejunostomy tube (HCC) (02/13/2021), Ovarian cyst, Pneumonia, Pneumonia of left lower lobe due to infectious organism (12/07/2022), Pulmonary embolism (HCC) (05/2017), S/P laparoscopic cholecystectomy (02/07/2021), Severe sepsis (HCC) (12/07/2022), Sleep apnea, and Vasomotor symptoms due to menopause (11/11/2016). Surgical: Ms. Gibbs  has a past surgical history that includes Tonsillectomy; Gastric bypass (2005); Reduction mammaplasty (Bilateral);  Spinal cord stimulator insertion (N/A, 03/20/2016); Abdominal hysterectomy (10/2006); Laparoscopic salpingo oophorectomy (Bilateral, 10/05/2016); Back surgery (2014); Back surgery (2009); Colonoscopy with propofol  (N/A, 04/12/2018); Cesarean section (1989 1995); Spine surgery (2009 2014 2017); Knee arthroscopy with lateral menisectomy (Left, 05/27/2017); Knee arthroscopy with lateral release (Left, 05/27/2017); Bariatric Surgery (07/2020); Cholecystectomy (01/2021); and Spinal cord stimulator battery exchange (Left, 06/01/2024). Family: family history includes Alcohol abuse in her paternal uncle; Arthritis in her father, mother, sister, and sister; Cancer in her father, maternal aunt, and maternal aunt; Diabetes in her maternal aunt, maternal aunt, maternal grandmother, mother, sister, and sister; Hypertension in her father, mother, and sister; Obesity in her daughter, father, mother, sister, and sister; Varicose Veins in her mother.  Laboratory Chemistry Profile   Renal Lab Results  Component Value Date   BUN 12 08/15/2024   CREATININE 0.78 08/15/2024   BCR 15 08/15/2024   GFRAA 100 07/24/2020   GFRNONAA >60 05/19/2024    Hepatic Lab Results  Component Value Date   AST 33 08/15/2024   ALT 36 (H) 08/15/2024   ALBUMIN  3.8 08/15/2024   ALKPHOS 136 (H) 08/15/2024   LIPASE 25 12/07/2022  Electrolytes Lab Results  Component Value Date   NA 144 08/15/2024   K 3.6 08/15/2024   CL 108 (H) 08/15/2024   CALCIUM 9.3 08/15/2024   MG 2.0 12/07/2022   PHOS 2.8 12/08/2022    Bone Lab Results  Component Value Date   VD25OH 18.0 (L) 08/15/2024    Inflammation (CRP: Acute Phase) (ESR: Chronic Phase) Lab Results  Component Value Date   LATICACIDVEN 1.6 12/08/2022         Note: Above Lab results reviewed.  Recent Imaging Review  MM 3D SCREENING MAMMOGRAM BILATERAL BREAST CLINICAL DATA:  Screening.  EXAM: DIGITAL SCREENING BILATERAL MAMMOGRAM WITH TOMOSYNTHESIS AND  CAD  TECHNIQUE: Bilateral screening digital craniocaudal and mediolateral oblique mammograms were obtained. Bilateral screening digital breast tomosynthesis was performed. The images were evaluated with computer-aided detection.  COMPARISON:  Previous exam(s).  ACR Breast Density Category b: There are scattered areas of fibroglandular density.  FINDINGS: There are no findings suspicious for malignancy.  IMPRESSION: No mammographic evidence of malignancy. A result letter of this screening mammogram will be mailed directly to the patient.  RECOMMENDATION: Screening mammogram in one year. (Code:[id number]-B-01Y)  BI-RADS CATEGORY  1: Negative.  Electronically Signed   By: Corean Salter M.D.   On: 03/02/2024 09:37 Note: Reviewed        Physical Exam  Vitals: BP (!) 144/83   Pulse 78   Temp (!) 97.2 F (36.2 C)   Resp 18   Ht 5' 7 (1.702 m)   Wt 213 lb (96.6 kg)   SpO2 99%   BMI 33.36 kg/m  BMI: Estimated body mass index is 33.36 kg/m as calculated from the following:   Height as of this encounter: 5' 7 (1.702 m).   Weight as of this encounter: 213 lb (96.6 kg). Ideal: Ideal body weight: 61.6 kg (135 lb 12.9 oz) Adjusted ideal body weight: 75.6 kg (166 lb 10.9 oz) General appearance: Well nourished, well developed, and well hydrated. In no apparent acute distress Mental status: Alert, oriented x 3 (person, place, & time)       Respiratory: No evidence of acute respiratory distress Eyes: PERLA  Musculoskeletal: +LBP Lumbar Exam  Skin & Axial Inspection: Well healed scar from previous spine surgery detected spinal cord stimulator IPG  along the left flank  Alignment: Symmetrical Functional ROM: Pain restricted ROM       Stability: No instability detected Muscle Tone/Strength: Functionally intact. No obvious neuro-muscular anomalies detected. Sensory (Neurological): Dermatomal pain pattern Palpation: No palpable anomalies       Provocative  Tests: Hyperextension/rotation test: (+) bilaterally for facet joint pain. Lumbar quadrant test (Kemp's test): (+) bilaterally for facet joint pain. Lateral bending test: (+) Facet mediated pain *(Flexion, ABduction and External Rotation)  Gait & Posture Assessment  Ambulation: Limited Gait: Antalgic gait (limping) Posture: Difficulty standing up straight, due to pain  Lower Extremity Exam      Side: Right lower extremity   Side: Left lower extremity  Stability: No instability observed           Stability: No instability observed          Skin & Extremity Inspection: Skin color, temperature, and hair growth are WNL. No peripheral edema or cyanosis. No masses, redness, swelling, asymmetry, or associated skin lesions. No contractures.   Skin & Extremity Inspection: Skin color, temperature, and hair growth are WNL. No peripheral edema or cyanosis. No masses, redness, swelling, asymmetry, or associated skin lesions. No contractures.  Functional ROM: Unrestricted ROM  Functional ROM: Unrestricted ROM                  Muscle Tone/Strength: Functionally intact. No obvious neuro-muscular anomalies detected.   Muscle Tone/Strength: Functionally intact. No obvious neuro-muscular anomalies detected.  Sensory (Neurological): Musculoskeletal pain pattern         Sensory (Neurological): Musculoskeletal pain pattern        DTR: Patellar: deferred today Achilles: deferred today Plantar: deferred today   DTR: Patellar: deferred today Achilles: deferred today Plantar: deferred today  Palpation: No palpable anomalies   Palpation: No palpable anomalies    Assessment   Diagnosis Status  1. Lumbar facet joint syndrome   2. Chronic radicular lumbar pain   3. Encounter for long-term opiate analgesic use   4. Failed back surgical syndrome   5. Chronic pain syndrome   6. History of lumbar fusion   7. Spinal cord stimulator status    Persistent Persistent Controlled   Updated  Problems: Problem  Lumbar Facet Joint Syndrome    Plan of Care  Problem-specific:  Assessment and Plan    Chronic lumbar radiculopathy Pain radiating from lower back to legs, worsened by cold weather. Increased pain and sleep difficulty since Sunday. - Continue hydrocodone  acetaminophen  10-325 mg as needed. - Prescribed tizanidine three times daily as needed, cautioning drowsiness, especially when driving or working. - Prescribed gabapentin  refill. - Advised to report on tizanidine effectiveness.  Chronic pain syndrome Increased pain and sleep difficulty since Sunday, managed with hydrocodone  acetaminophen  and gabapentin . - Continue hydrocodone  acetaminophen  and gabapentin . - Prescribed tizanidine as an additional muscle relaxant.   Encounter for long-term opioid analgesics use: Patient's pain is controlled with hydrocodone  and gabapentin , will continue on current medication regimen.  Prescribing drug monitoring (PDMP) reviewed, findings consistent with the use of prescribed medication as of narcotic misuse or abuse.  Urine drug screening (UDS) up to date.  No side effects or adverse reaction reported to medication.  Schedule follow-up in 90 days for medication management.  Plan: (Clinic): (B/L) L-FCT # 1 with (IV versed ) Dr. Marcelino       Ms. Corin Evette Cowan has a current medication list which includes the following long-term medication(s): fluticasone , furosemide , potassium chloride , rizatriptan , sertraline , temazepam , and gabapentin .  Pharmacotherapy (Medications Ordered): Meds ordered this encounter  Medications   gabapentin  (NEURONTIN ) 300 MG capsule    Sig: Take 2 capsules (600 mg total) by mouth at bedtime.    Dispense:  60 capsule    Refill:  5   HYDROcodone -acetaminophen  (NORCO) 10-325 MG tablet    Sig: Take 1 tablet by mouth every 8 (eight) hours as needed.    Dispense:  90 tablet    Refill:  0   tiZANidine (ZANAFLEX) 4 MG tablet    Sig: Take 1 tablet  (4 mg total) by mouth every 8 (eight) hours as needed for muscle spasms.    Dispense:  90 tablet    Refill:  0    Do not place this medication, or any other prescription from our practice, on Automatic Refill. Patient may have prescription filled one day early if pharmacy is closed on scheduled refill date.   ketorolac  (TORADOL ) injection 60 mg   methocarbamol  (ROBAXIN ) injection 200 mg   Orders:  Orders Placed This Encounter  Procedures   LUMBAR FACET(MEDIAL BRANCH NERVE BLOCK) MBNB    Diagnosis: Lumbar Facet Syndrome (M47.816); Lumbosacral Facet Syndrome (M47.817); Lumbar Facet Joint Pain (M54.59) Medical Necessity Statement: 1.Severe chronic axial low back pain causing  functional impairment documented by ongoing pain scale assessments. 2.Pain present for longer than 3 months (Chronic) documented to have failed noninvasive conservative therapies. 3.Absence of untreated radiculopathy. 4.There is no radiological evidence of untreated fractures, tumor, infection, or deformity.  Physical Examination Findings: Positive Kemp Maneuver: (Y)  Positive Lumbar Hyperextension-Rotation provocative test: (Y)    Standing Status:   Future    Expiration Date:   12/13/2024    Scheduling Instructions:     Procedure: Lumbar facet Block     Type: Medial Branch Block     Side: Bilateral     Purpose: Diagnostic/Therapeutic     Level(s): L3-4 and L4-5 Facets (L3, L4, and L5 Medial Branch)     Sedation: With Sedation. (IV versed )     Timeframe: As soon as schedule allows.    Where will this procedure be performed?:   [facility]        Return in about 4 weeks (around 10/12/2024) for [facility]): (B/L) L-FCT # 1 with (IV versed ) Dr. Marcelino .    Recent Visits Date Type Provider Dept  06/20/24 Office Visit Lakiya Cottam K, NP [facility]  Showing recent visits within past 90 days and meeting all other requirements Today's Visits Date Type Provider Dept  09/14/24 Office Visit Artha Stavros,  Mackay Hanauer K, NP [facility]  Showing today's visits and meeting all other requirements Future Appointments Date Type Provider Dept  12/05/24 Appointment Assata Juncaj K, NP [facility]  Showing future appointments within next 90 days and meeting all other requirements  I discussed the assessment and treatment plan with the patient. The patient was provided an opportunity to ask questions and all were answered. The patient agreed with the plan and demonstrated an understanding of the instructions.  Patient advised to call back or seek an in-person evaluation if the symptoms or condition worsens.  I personally spent a total of 30 minutes in the care of the patient today including preparing to see the patient, getting/reviewing separately obtained history, performing a medically appropriate exam/evaluation, counseling and educating, placing orders, referring and communicating with other health care professionals, documenting clinical information in the EHR, independently interpreting results, communicating results, and coordinating care.   Note by: Dimarco Minkin K Braedon Sjogren, NP (TTS and AI technology used. I apologize for any typographical errors that were not detected and corrected.) Date: 09/14/2024; Time: 2:37 PM

## 2024-09-17 ENCOUNTER — Other Ambulatory Visit: Payer: Self-pay | Admitting: Internal Medicine

## 2024-09-17 DIAGNOSIS — G43909 Migraine, unspecified, not intractable, without status migrainosus: Secondary | ICD-10-CM

## 2024-09-20 NOTE — Telephone Encounter (Signed)
 Requested Prescriptions  Pending Prescriptions Disp Refills   rizatriptan  (MAXALT -MLT) 10 MG disintegrating tablet [Pharmacy Med Name: RIZATRIPTAN  ODT 10MG  TABLETS] 10 tablet 0    Sig: DISSOLVE 1 TABLET BY MOUTH AS NEEDED FOR MIGRAINE. MAY REPEAT IN 2 HOURS AS NEEDED     Neurology:  Migraine Therapy - Triptan Failed - 09/20/2024 10:05 AM      Failed - Last BP in normal range    BP Readings from Last 1 Encounters:  09/14/24 (!) 144/83         Passed - Valid encounter within last 12 months    Recent Outpatient Visits           1 month ago Annual physical exam   [facility], Leita DEL, MD   4 months ago Prediabetes   [facility] at [facility], Leita DEL, MD   7 months ago Primary hypertension   [facility] at [facility], Leita DEL, MD

## 2024-10-02 ENCOUNTER — Other Ambulatory Visit: Payer: Self-pay | Admitting: Internal Medicine

## 2024-10-02 ENCOUNTER — Encounter: Payer: Self-pay | Admitting: Family Medicine

## 2024-10-02 ENCOUNTER — Encounter: Payer: Self-pay | Admitting: Nurse Practitioner

## 2024-10-02 DIAGNOSIS — F5101 Primary insomnia: Secondary | ICD-10-CM

## 2024-10-02 DIAGNOSIS — E538 Deficiency of other specified B group vitamins: Secondary | ICD-10-CM

## 2024-10-02 MED ORDER — "SYRINGE 25G X 1-1/2"" 3 ML MISC": 1.0000 | 0 refills | Status: AC

## 2024-10-02 MED ORDER — TEMAZEPAM 15 MG PO CAPS: 15.0000 mg | ORAL_CAPSULE | Freq: Every evening | ORAL | 0 refills | Status: AC | PRN

## 2024-10-11 ENCOUNTER — Encounter: Payer: Self-pay | Admitting: Family Medicine

## 2024-10-16 ENCOUNTER — Telehealth: Payer: Self-pay

## 2024-10-16 ENCOUNTER — Encounter: Payer: Self-pay | Admitting: Student in an Organized Health Care Education/Training Program

## 2024-10-16 NOTE — Telephone Encounter (Signed)
 Please review.  KP

## 2024-10-16 NOTE — Telephone Encounter (Signed)
 Insurance Treatment Denial Note  Date order was entered:  Order entered by: Emmy Blanch Requested treatment: lumbar facets Reason for denial: see below Recommended for approval: denial reason listed below   Your doctor told us  that you have back pain that is coming from the small joints in your back. Your doctor wants to give you a special injection of numbing medicine to that area of your back. This injection is needed when your pain is limited to your back. The pain should not travel down your leg. Pain in your legs might represent radicular symptoms. These symptoms might me pain, numbness, weakness or tingling in your legs. We reviewed the notes we received. The notes show that you do have untreated radicular symptoms in your legs. As a result this injection is not medically necessary.

## 2024-10-17 ENCOUNTER — Encounter: Payer: Self-pay | Admitting: Family Medicine

## 2024-10-17 ENCOUNTER — Ambulatory Visit (INDEPENDENT_AMBULATORY_CARE_PROVIDER_SITE_OTHER): Admitting: Family Medicine

## 2024-10-17 ENCOUNTER — Other Ambulatory Visit: Payer: Self-pay | Admitting: Family Medicine

## 2024-10-17 VITALS — BP 110/84 | HR 79 | Ht 67.0 in | Wt 213.0 lb

## 2024-10-17 DIAGNOSIS — F5101 Primary insomnia: Secondary | ICD-10-CM

## 2024-10-17 DIAGNOSIS — G894 Chronic pain syndrome: Secondary | ICD-10-CM | POA: Diagnosis not present

## 2024-10-17 DIAGNOSIS — G43909 Migraine, unspecified, not intractable, without status migrainosus: Secondary | ICD-10-CM

## 2024-10-17 DIAGNOSIS — K219 Gastro-esophageal reflux disease without esophagitis: Secondary | ICD-10-CM

## 2024-10-17 DIAGNOSIS — F411 Generalized anxiety disorder: Secondary | ICD-10-CM

## 2024-10-17 DIAGNOSIS — Z6833 Body mass index (BMI) 33.0-33.9, adult: Secondary | ICD-10-CM | POA: Diagnosis not present

## 2024-10-17 DIAGNOSIS — G4733 Obstructive sleep apnea (adult) (pediatric): Secondary | ICD-10-CM | POA: Diagnosis not present

## 2024-10-17 DIAGNOSIS — Z9884 Bariatric surgery status: Secondary | ICD-10-CM | POA: Diagnosis not present

## 2024-10-17 MED ORDER — PANTOPRAZOLE SODIUM 40 MG PO TBEC: 40.0000 mg | DELAYED_RELEASE_TABLET | Freq: Two times a day (BID) | ORAL | 1 refills | Status: AC

## 2024-10-17 MED ORDER — MIRTAZAPINE 15 MG PO TBDP: 15.0000 mg | ORAL_TABLET | Freq: Every day | ORAL | 0 refills | Status: AC

## 2024-10-17 MED ORDER — FAMOTIDINE 20 MG PO TABS: 20.0000 mg | ORAL_TABLET | Freq: Two times a day (BID) | ORAL | 0 refills | Status: AC

## 2024-10-17 NOTE — Telephone Encounter (Signed)
 Refilled 10/17/24. Requested Prescriptions  Refused Prescriptions Disp Refills   famotidine  (PEPCID ) 20 MG tablet [Pharmacy Med Name: FAMOTIDINE  20MG  TABLETS] 180 tablet     Sig: TAKE 1 TABLET(20 MG) BY MOUTH TWICE DAILY     Gastroenterology:  H2 Antagonists Passed - 10/17/2024  3:48 PM      Passed - Valid encounter within last 12 months    Recent Outpatient Visits           Today Primary insomnia   [facility], Vinay K, MD   2 months ago Annual physical exam   [facility] at [facility], Leita DEL, MD   5 months ago Prediabetes   [facility] at [facility], Leita DEL, MD   8 months ago Primary hypertension   [facility] at [facility], Leita DEL, MD

## 2024-10-17 NOTE — Progress Notes (Signed)
 "  Established Patient Office Visit  Patient ID: Kimberly Cowan, female    DOB: Jul 18, 1966  Age: 59 y.o. MRN: [id number] PCP: Ronell Duffus K, MD  Chief Complaint  Patient presents with   Transitions Of Care    Results of sleep study and talk about Zepbound    Subjective:     HPI  Discussed the use of AI scribe software for clinical note transcription with the patient, who gave verbal consent to proceed.  History of Present Illness Kimberly Cowan is a 59 year old female who presents for a follow-up regarding her sleep study and medication management.  She recently underwent a sleep study, with results pending. She has a history of sleep apnea and uses a CPAP machine, last updated in 2017. She experiences interrupted sleep, waking up around 11:30 PM or midnight and staying awake for the rest of the night. She has difficulty both falling and staying asleep despite taking temazepam  for about a year. Prior to temazepam , she was on Belsomra , which was discontinued due to insurance coverage issues. She takes temazepam  once daily at night.  She has chronic back pain, having undergone five back surgeries, including two fusion surgeries and a spinal cord stimulator placement. She takes gabapentin , a muscle relaxer, and hydrocodone  for pain management. She is under the care of a pain clinic.  She experiences migraines, which occur more frequently when she is stressed. She uses Maxalt  for relief.  She has a history of anxiety, managed with Zoloft  100 mg daily for about a year, initially prescribed due to panic attacks. She also takes Lasix  as needed for fluid retention and uses diclofenac for pain.  She underwent gastric bypass surgery in 2005 with a revision in 2021. She experiences severe acid reflux, managed with Protonix  40 mg twice daily. She reports that when she tried taking only one Protonix  pill per day, she still experienced symptoms such as 'bubbling  up' and vomiting. She has tried over-the-counter Pepcid  without success. She is also out of Lomotil , which was previously prescribed for gastrointestinal issues post-surgery.      Review of Systems  All other systems reviewed and are negative.     Objective:     BP 110/84   Pulse 79   Ht 5' 7 (1.702 m)   Wt 213 lb (96.6 kg)   SpO2 97%   BMI 33.36 kg/m     Physical Exam Vitals and nursing note reviewed.  Constitutional:      Appearance: Normal appearance.  HENT:     Head: Normocephalic.     Right Ear: External ear normal.     Left Ear: External ear normal.  Eyes:     Conjunctiva/sclera: Conjunctivae normal.  Cardiovascular:     Rate and Rhythm: Normal rate.  Pulmonary:     Effort: Pulmonary effort is normal. No respiratory distress.  Abdominal:     Palpations: Abdomen is soft.  Musculoskeletal:        General: Normal range of motion.  Skin:    General: Skin is warm.  Neurological:     Mental Status: She is alert and oriented to person, place, and time.  Psychiatric:        Mood and Affect: Mood normal.     Physical Exam    No results found for any visits on 10/17/24.     The 10-year ASCVD risk score (Arnett DK, et al., 2019) is: 1.5%    Assessment & Plan:   Problem List  Items Addressed This Visit       Other   Primary insomnia - Primary (Chronic)    Assessment and Plan Assessment & Plan Primary insomnia Chronic insomnia with difficulty falling and staying asleep. Discussed risks of long-term temazepam  use, including dementia and fall risk. Decided to switch to mirtazapine  for mood and sleep. - Prescribed mirtazapine , starting with half a pill at bedtime, increasing to a full pill after one week if needed. - Educated on sleep hygiene practices.  Obstructive sleep apnea on CPAP Obstructive sleep apnea managed with CPAP. Awaiting results of recent sleep study for insurance coverage of weight loss medication. - Await sleep study results to  determine insurance coverage for weight loss medication.  Chronic gastroesophageal reflux disease post bariatric surgery Chronic GERD post-gastric bypass surgery with revision in 2021. Currently on high-dose pantoprazole  due to inadequate control with lower doses. Discussed potential risks of long-term high-dose pantoprazole , including dementia. Plan to transition to Pepcid  with gradual weaning of pantoprazole . - Reduce pantoprazole  to once daily. - Introduce Pepcid  in the evening. - Gradually wean off pantoprazole  over two weeks by alternating with Pepcid . - Consider referral to GI specialist for further management.  Generalized anxiety disorder Managed with sertraline . Plan to switch to mirtazapine  for dual benefit of mood stabilization and sleep aid. - Discontinued sertraline .  Chronic back pain with history of spinal surgeries Chronic back pain with multiple spinal surgeries, including fusion and spinal cord stimulator. Managed with gabapentin , muscle relaxers, and hydrocodone .  Migraine Intermittent migraines, often stress-related. Managed with Maxalt , which is effective. - Continue Maxalt  as needed for migraine management.    No follow-ups on file.    Kimberly Swint K Ebonie Westerlund, MD [facility]   "

## 2024-10-25 ENCOUNTER — Encounter: Payer: Self-pay | Admitting: Family Medicine

## 2024-10-25 NOTE — Telephone Encounter (Signed)
 Please review patient's message:

## 2024-10-26 ENCOUNTER — Other Ambulatory Visit (HOSPITAL_COMMUNITY): Payer: Self-pay

## 2024-10-26 ENCOUNTER — Telehealth: Payer: Self-pay

## 2024-10-26 ENCOUNTER — Other Ambulatory Visit: Payer: Self-pay | Admitting: Family Medicine

## 2024-10-26 ENCOUNTER — Telehealth: Payer: Self-pay | Admitting: Pharmacy Technician

## 2024-10-26 DIAGNOSIS — Z6833 Body mass index (BMI) 33.0-33.9, adult: Secondary | ICD-10-CM

## 2024-10-26 DIAGNOSIS — G4733 Obstructive sleep apnea (adult) (pediatric): Secondary | ICD-10-CM

## 2024-10-26 MED ORDER — ZEPBOUND 2.5 MG/0.5ML ~~LOC~~ SOAJ: 2.5000 mg | SUBCUTANEOUS | 0 refills | Status: AC

## 2024-10-26 NOTE — Telephone Encounter (Signed)
 Pharmacy Patient Advocate Encounter   Received notification from Pt Calls Messages that prior authorization for Zepbound  2.5 mg/0.5ml pen is required/requested.   Insurance verification completed.   The patient is insured through [facility].   Per test claim: Per test claim, medication is not covered due to plan/benefit exclusion, PA not submitted at this time

## 2024-10-26 NOTE — Telephone Encounter (Signed)
 Kimberly Cowan,   Could we run a PA with her [facility] medicare?

## 2024-10-26 NOTE — Telephone Encounter (Signed)
 Pharmacy Patient Advocate Encounter   Received notification from Pt Calls Messages that prior authorization for Zepbound  2.5MG /0.5ML pen-injectors is required/requested.   Insurance verification completed.   The patient is insured through [facility].   Per test claim: PA required; PA started via CoverMyMeds. KEY [id number] . Waiting for clinical questions to populate.

## 2024-10-26 NOTE — Telephone Encounter (Signed)
Error. Please see previous encounter

## 2024-10-26 NOTE — Telephone Encounter (Signed)
 PA request has been Received. New Encounter has been or will be created for follow up. For additional info see Pharmacy Prior Auth telephone encounter from 10/26/24.

## 2024-10-26 NOTE — Telephone Encounter (Signed)
 Thank you for the update!

## 2024-10-27 ENCOUNTER — Other Ambulatory Visit (HOSPITAL_COMMUNITY): Payer: Self-pay

## 2024-10-30 ENCOUNTER — Telehealth: Payer: Self-pay | Admitting: Family Medicine

## 2024-10-30 NOTE — Telephone Encounter (Unsigned)
 Copied from CRM [id number]. Topic: Clinical - Medication Question >> Oct 30, 2024 11:15 AM Gustabo BIRCH wrote: Sueanne with Prior auth with [employer] [facility]- tirzepatide  (ZEPBOUND ) 2.5 MG/0.5ML Pen Call back- [phone]   Case ID [id number]

## 2024-10-31 ENCOUNTER — Other Ambulatory Visit (HOSPITAL_COMMUNITY): Payer: Self-pay

## 2024-10-31 NOTE — Telephone Encounter (Signed)
 Good afternoon, her PA for zepbound  was approved 10/27/24 until 09/27/25

## 2024-10-31 NOTE — Telephone Encounter (Signed)
 Approved - pt is aware

## 2024-10-31 NOTE — Telephone Encounter (Signed)
Sent pt message on Mychart.  KP

## 2024-10-31 NOTE — Telephone Encounter (Signed)
 Please review.  KP

## 2024-11-17 ENCOUNTER — Ambulatory Visit: Admitting: Family Medicine

## 2024-12-05 ENCOUNTER — Encounter: Admitting: Nurse Practitioner

## 2024-12-06 ENCOUNTER — Ambulatory Visit

## 2024-12-07 ENCOUNTER — Ambulatory Visit
